# Patient Record
Sex: Male | Born: 1955
Health system: Southern US, Community
[De-identification: ages and names within clinical notes are randomized; demographics above are authoritative.]

## PROBLEM LIST (undated history)

## (undated) DIAGNOSIS — J449 Chronic obstructive pulmonary disease, unspecified: Secondary | ICD-10-CM

## (undated) DIAGNOSIS — N2889 Other specified disorders of kidney and ureter: Secondary | ICD-10-CM

## (undated) DIAGNOSIS — N400 Enlarged prostate without lower urinary tract symptoms: Secondary | ICD-10-CM

## (undated) DIAGNOSIS — Z9981 Dependence on supplemental oxygen: Secondary | ICD-10-CM

## (undated) DIAGNOSIS — E785 Hyperlipidemia, unspecified: Secondary | ICD-10-CM

## (undated) DIAGNOSIS — I5189 Other ill-defined heart diseases: Secondary | ICD-10-CM

## (undated) DIAGNOSIS — I10 Essential (primary) hypertension: Secondary | ICD-10-CM

## (undated) DIAGNOSIS — N183 Chronic kidney disease, stage 3 unspecified: Secondary | ICD-10-CM

## (undated) DIAGNOSIS — G4733 Obstructive sleep apnea (adult) (pediatric): Secondary | ICD-10-CM

## (undated) DIAGNOSIS — J45909 Unspecified asthma, uncomplicated: Secondary | ICD-10-CM

## (undated) DIAGNOSIS — I160 Hypertensive urgency: Secondary | ICD-10-CM

## (undated) DIAGNOSIS — E669 Obesity, unspecified: Secondary | ICD-10-CM

## (undated) DIAGNOSIS — E119 Type 2 diabetes mellitus without complications: Secondary | ICD-10-CM

## (undated) DIAGNOSIS — I509 Heart failure, unspecified: Secondary | ICD-10-CM

## (undated) DIAGNOSIS — I429 Cardiomyopathy, unspecified: Secondary | ICD-10-CM

## (undated) HISTORY — DX: Cardiomyopathy, unspecified: I42.9

## (undated) HISTORY — DX: Other ill-defined heart diseases: I51.89

## (undated) HISTORY — DX: Hyperlipidemia, unspecified: E78.5

## (undated) HISTORY — DX: Benign prostatic hyperplasia without lower urinary tract symptoms: N40.0

## (undated) HISTORY — DX: Other specified disorders of kidney and ureter: N28.89

## (undated) HISTORY — DX: Hypertensive urgency: I16.0

## (undated) HISTORY — DX: Obstructive sleep apnea (adult) (pediatric): G47.33

## (undated) HISTORY — DX: Obesity, unspecified: E66.9

## (undated) HISTORY — DX: Chronic kidney disease, stage 3 unspecified: N18.30

## (undated) HISTORY — DX: Unspecified asthma, uncomplicated: J45.909

## (undated) HISTORY — DX: Essential (primary) hypertension: I10

## (undated) HISTORY — DX: Chronic kidney disease, stage 3 (moderate): N18.3

---

## 2006-10-17 ENCOUNTER — Emergency Department (HOSPITAL_COMMUNITY): Admission: EM | Admit: 2006-10-17 | Discharge: 2006-10-17 | Payer: Self-pay | Admitting: Emergency Medicine

## 2006-11-07 ENCOUNTER — Emergency Department (HOSPITAL_COMMUNITY): Admission: EM | Admit: 2006-11-07 | Discharge: 2006-11-07 | Payer: Self-pay | Admitting: Emergency Medicine

## 2006-11-09 ENCOUNTER — Emergency Department (HOSPITAL_COMMUNITY): Admission: EM | Admit: 2006-11-09 | Discharge: 2006-11-09 | Payer: Self-pay | Admitting: Family Medicine

## 2011-02-24 LAB — CULTURE, ROUTINE-ABSCESS

## 2011-02-25 LAB — WOUND CULTURE

## 2011-04-22 ENCOUNTER — Emergency Department (HOSPITAL_COMMUNITY)
Admission: EM | Admit: 2011-04-22 | Discharge: 2011-04-22 | Disposition: A | Discharge status: Home / Self Care | Payer: Self-pay | Attending: Emergency Medicine | Admitting: Emergency Medicine

## 2011-04-22 DIAGNOSIS — R030 Elevated blood-pressure reading, without diagnosis of hypertension: Secondary | ICD-10-CM | POA: Insufficient documentation

## 2011-04-22 DIAGNOSIS — J40 Bronchitis, not specified as acute or chronic: Secondary | ICD-10-CM | POA: Insufficient documentation

## 2011-04-22 DIAGNOSIS — W278XXA Contact with other nonpowered hand tool, initial encounter: Secondary | ICD-10-CM | POA: Insufficient documentation

## 2011-04-22 DIAGNOSIS — S01409A Unspecified open wound of unspecified cheek and temporomandibular area, initial encounter: Secondary | ICD-10-CM | POA: Insufficient documentation

## 2011-04-22 DIAGNOSIS — IMO0002 Reserved for concepts with insufficient information to code with codable children: Secondary | ICD-10-CM

## 2011-04-22 MED ORDER — TETANUS-DIPHTHERIA TOXOIDS TD 5-2 LFU IM INJ
0.5000 mL | INJECTION | Freq: Once | INTRAMUSCULAR | Status: AC
  Administered 2011-04-22: 0.5 mL via INTRAMUSCULAR
  Filled 2011-04-22: qty 0.5

## 2011-04-22 MED ORDER — ALBUTEROL SULFATE HFA 108 (90 BASE) MCG/ACT IN AERS
2.0000 | INHALATION_SPRAY | Freq: Once | RESPIRATORY_TRACT | Status: AC
  Administered 2011-04-22: 2 via RESPIRATORY_TRACT
  Filled 2011-04-22: qty 6.7

## 2011-04-22 NOTE — ED Notes (Signed)
CBG 113 at 14:57

## 2011-04-22 NOTE — ED Provider Notes (Signed)
Medical screening examination/treatment/procedure(s) were performed by non-physician practitioner and as supervising physician I was immediately available for consultation/collaboration.  Doug Sou, MD 04/22/11 2038

## 2011-04-22 NOTE — ED Notes (Signed)
Pt to ED for eval of face lac; pt reports that he was shaving earlier, and the razor popped off and cut his R cheek; pt has no active bleeding

## 2011-04-22 NOTE — ED Notes (Signed)
Rt. Cheek laceration/bleeding controlled was shaving and the razor broke

## 2011-04-22 NOTE — ED Provider Notes (Signed)
History     CSN: [id number] Arrival date & time: 04/22/2011  2:36 PM   First MD Initiated Contact with Patient 04/22/11 1527      Chief Complaint  Patient presents with  . Facial Laceration    cough, rt. ankle swollen,     (Consider location/radiation/quality/duration/timing/severity/associated sxs/prior treatment) The history is provided by the patient.    Pt presents to the Emergency Department with complaints of a laceration to the face by a razor. The pt says he was shaving his face when the razor broke cutting his face. The incident happened this morning, since then it has stopped bleeding. Pt states that this has never happened to him before, the pain is mild, the cut is mild and he is not having any other associated symptoms. Pt also tells me that he is out of his albuterol inhaler and would like a refill. Pts blood pressure has been high in the ED, he also needs a referral to a Primary Care provider to manage his BP.  History reviewed. No pertinent past medical history.  History reviewed. No pertinent past surgical history.  Family History  Problem Relation Age of Onset  . Diabetes Mother   . Diabetes Father     History  Substance Use Topics  . Smoking status: Current Everyday Smoker    Types: Cigarettes  . Smokeless tobacco: Not on file  . Alcohol Use: Yes      Review of Systems  All other systems reviewed and are negative.    Allergies  Review of patient's allergies indicates no known allergies.  Home Medications  No current outpatient prescriptions on file.  BP 153/92  Pulse 97  Resp 20  Ht 6\' 1"  (1.854 m)  Wt 273 lb (123.832 kg)  BMI 36.02 kg/m2  SpO2 100%  Physical Exam  Constitutional: He is oriented to person, place, and time. He appears well-developed and well-nourished.  HENT:  Head: Normocephalic and atraumatic.    Eyes: Pupils are equal, round, and reactive to light.  Neck: Normal range of motion.  Cardiovascular: Normal rate and  regular rhythm.   Pulmonary/Chest: Effort normal and breath sounds normal.  Musculoskeletal: Normal range of motion.  Neurological: He is alert and oriented to person, place, and time.  Skin: Skin is warm and dry.    ED Course  Procedures (including critical care time)  Labs Reviewed  GLUCOSE, CAPILLARY - Abnormal; Notable for the following:    Glucose-Capillary 113 (*)    All other components within normal limits  POCT CBG MONITORING   No results found.   1. Laceration   2. Bronchitis       MDM  Will give pt PCP referrals, Td, and albuterol inhaler.        Dorthula Matas, PA 04/22/11 [phone]

## 2016-01-20 ENCOUNTER — Encounter (HOSPITAL_COMMUNITY): Payer: Self-pay | Admitting: Emergency Medicine

## 2016-01-20 ENCOUNTER — Emergency Department (HOSPITAL_COMMUNITY)
Admission: EM | Admit: 2016-01-20 | Discharge: 2016-01-20 | Disposition: A | Discharge status: Home / Self Care | Payer: Self-pay | Attending: Emergency Medicine | Admitting: Emergency Medicine

## 2016-01-20 ENCOUNTER — Emergency Department (HOSPITAL_COMMUNITY): Payer: Self-pay

## 2016-01-20 DIAGNOSIS — I10 Essential (primary) hypertension: Secondary | ICD-10-CM | POA: Insufficient documentation

## 2016-01-20 DIAGNOSIS — F17210 Nicotine dependence, cigarettes, uncomplicated: Secondary | ICD-10-CM | POA: Insufficient documentation

## 2016-01-20 DIAGNOSIS — M5432 Sciatica, left side: Secondary | ICD-10-CM | POA: Insufficient documentation

## 2016-01-20 LAB — I-STAT CHEM 8, ED
BUN: 27 mg/dL — ABNORMAL HIGH (ref 6–20)
Calcium, Ion: 1.11 mmol/L — ABNORMAL LOW (ref 1.15–1.40)
Chloride: 105 mmol/L (ref 101–111)
Creatinine, Ser: 1.4 mg/dL — ABNORMAL HIGH (ref 0.61–1.24)
GLUCOSE: 127 mg/dL — AB (ref 65–99)
HCT: 49 % (ref 39.0–52.0)
HEMOGLOBIN: 16.7 g/dL (ref 13.0–17.0)
POTASSIUM: 3.9 mmol/L (ref 3.5–5.1)
SODIUM: 141 mmol/L (ref 135–145)
TCO2: 29 mmol/L (ref 0–100)

## 2016-01-20 MED ORDER — TRAMADOL HCL 50 MG PO TABS: 50.0000 mg | ORAL_TABLET | Freq: Four times a day (QID) | ORAL | 0 refills | Status: DC | PRN

## 2016-01-20 MED ORDER — OXYCODONE-ACETAMINOPHEN 5-325 MG PO TABS
2.0000 | ORAL_TABLET | Freq: Once | ORAL | Status: AC
  Administered 2016-01-20: 2 via ORAL
  Filled 2016-01-20: qty 2

## 2016-01-20 NOTE — ED Provider Notes (Signed)
[facility]EM[facility] DEPT Provider Note   CSN: [id number] Arrival date & time: 01/20/16  0856     History   Chief Complaint Chief Complaint  Patient presents with  . Hypertension  . Back Pain    HPI Zachary Hawkins is a 60 y.o. male.   Patient presents with back pain. He states he has about a 1 month history of pain in his left posterior hip. He states it started after he was lifting a pool table and he felt pool to the back of his leg. He states it shoots down his leg. Occasionally he'll have some tingling to his foot and lower leg. He denies any weakness to the leg. No problems with bowel or bladder control. No abdominal pain. No nausea or vomiting. He states taking ibuprofen with some improvement in symptoms. He states the pain is worse when he ambulates. He denies any prior history of back problems. His blood pressure is noted to be elevated. He feels like it's been high in the past. He currently doesn't have a PCP. He is not been on blood pressure medications in the past.    Hypertension  Pertinent negatives include no headaches.  Back Pain   Associated symptoms include numbness. Pertinent negatives include no fever, no headaches and no weakness.    History reviewed. No pertinent past medical history.  There are no active problems to display for this patient.   History reviewed. No pertinent surgical history.     Home Medications    Prior to Admission medications   Medication Sig Start Date End Date Taking? Authorizing Provider  traMADol (ULTRAM) 50 MG tablet Take 1 tablet (50 mg total) by mouth every 6 (six) hours as needed. 01/20/16   Rolan Bucco, MD    Family History Family History  Problem Relation Age of Onset  . Diabetes Mother   . Diabetes Father     Social History Social History  Substance Use Topics  . Smoking status: Current Every Day Smoker    Types: Cigarettes  . Smokeless tobacco: Never Used  . Alcohol use Yes     Allergies   Review of  patient's allergies indicates no known allergies.   Review of Systems Review of Systems  Constitutional: Negative for fever.  Gastrointestinal: Negative for nausea and vomiting.  Musculoskeletal: Positive for arthralgias and back pain. Negative for joint swelling and neck pain.  Skin: Negative for wound.  Neurological: Positive for numbness. Negative for weakness and headaches.     Physical Exam Updated Vital Signs BP 172/95   Pulse 83   Temp 97.9 F (36.6 C) (Oral)   Resp 18   SpO2 96%   Physical Exam  Constitutional: He is oriented to person, place, and time. He appears well-developed and well-nourished.  HENT:  Head: Normocephalic and atraumatic.  Neck: Normal range of motion. Neck supple.  Cardiovascular: Normal rate.   Pulmonary/Chest: Effort normal.  Musculoskeletal: He exhibits tenderness. He exhibits no edema.  Patient has no tenderness along the spine. There is no pain to the lumbar area. There is pain to the posterior left hip, in the area of the sciatic nerve, although it feels little bit lateral. There is pain with range of motion of the hip although it seems to be more in the soft tissues. There is no palpable tenderness to the knee or ankle. No pain to the lower leg. No swelling of the leg. Pedal pulses are intact. He has normal sensation and motor function in the leg. Negative  straight leg raise. Patellar reflexes are intact.  Neurological: He is alert and oriented to person, place, and time.  Skin: Skin is warm and dry.  Psychiatric: He has a normal mood and affect.     ED Treatments / Results  Labs (all labs ordered are listed, but only abnormal results are displayed) Labs Reviewed  I-STAT CHEM 8, ED - Abnormal; Notable for the following:       Result Value   BUN 27 (*)    Creatinine, Ser 1.40 (*)    Glucose, Bld 127 (*)    Calcium, Ion 1.11 (*)    All other components within normal limits    EKG  EKG Interpretation None       Radiology Dg  Hip Unilat W Or Wo Pelvis 2-3 Views Left  Result Date: 01/20/2016 CLINICAL DATA:  Chronic left lateral and posterior hip pain for 1 month. EXAM: DG HIP (WITH OR WITHOUT PELVIS) 2-3V LEFT COMPARISON:  None. FINDINGS: There is no evidence of hip fracture or dislocation. There is no evidence of arthropathy or other focal bone abnormality. IMPRESSION: Negative left hip radiographs. Electronically Signed   By: Marin Robertshristopher  Mattern M.D.   On: 01/20/2016 11:16    Procedures Procedures (including critical care time)  Medications Ordered in ED Medications  oxyCODONE-acetaminophen (PERCOCET/ROXICET) 5-325 MG per tablet 2 tablet (2 tablets Oral Given 01/20/16 1117)     Initial Impression / Assessment and Plan / ED Course  I have reviewed the triage vital signs and the nursing notes.  Pertinent labs & imaging results that were available during my care of the patient were reviewed by me and considered in my medical decision making (see chart for details).  Clinical Course    Patient presents with posterior hip pain. It seems to be coming from his sciatica. He is neurologically intact. He has no signs of cauda equina. No back pain. His x-rays are unremarkable. His blood pressure is elevated. I don't find any documentation of significantly elevated blood pressures in the past. I encouraged him to have close follow-up with the PCP. His creatinine is also elevated. He was given a resource guide for possible outpatient follow-up. He was given a prescription for tramadol for pain. Return precautions were given.  Final Clinical Impressions(s) / ED Diagnoses   Final diagnoses:  Sciatica of left side  Essential hypertension    New Prescriptions New Prescriptions   TRAMADOL (ULTRAM) 50 MG TABLET    Take 1 tablet (50 mg total) by mouth every 6 (six) hours as needed.     Rolan BuccoMelanie Higinio Grow, MD 01/20/16 1304

## 2016-01-20 NOTE — ED Triage Notes (Signed)
Pt here for c/o left lower back pain to left leg since lifting a pool table several weeks ago. States he has intermittent left lower leg numbness.  Also, pt does not "go to a doctor". BP high.

## 2017-06-05 ENCOUNTER — Emergency Department (HOSPITAL_BASED_OUTPATIENT_CLINIC_OR_DEPARTMENT_OTHER): Payer: Self-pay

## 2017-06-05 ENCOUNTER — Inpatient Hospital Stay (HOSPITAL_BASED_OUTPATIENT_CLINIC_OR_DEPARTMENT_OTHER)
Admission: EM | Admit: 2017-06-05 | Discharge: 2017-06-07 | DRG: 291 | Disposition: A | Discharge status: Home / Self Care | Payer: Self-pay | Attending: Internal Medicine | Admitting: Internal Medicine

## 2017-06-05 ENCOUNTER — Encounter (HOSPITAL_BASED_OUTPATIENT_CLINIC_OR_DEPARTMENT_OTHER): Payer: Self-pay | Admitting: Emergency Medicine

## 2017-06-05 ENCOUNTER — Other Ambulatory Visit: Payer: Self-pay

## 2017-06-05 DIAGNOSIS — I420 Dilated cardiomyopathy: Secondary | ICD-10-CM | POA: Diagnosis present

## 2017-06-05 DIAGNOSIS — J449 Chronic obstructive pulmonary disease, unspecified: Secondary | ICD-10-CM | POA: Diagnosis present

## 2017-06-05 DIAGNOSIS — N1831 Chronic kidney disease, stage 3a: Secondary | ICD-10-CM

## 2017-06-05 DIAGNOSIS — I130 Hypertensive heart and chronic kidney disease with heart failure and stage 1 through stage 4 chronic kidney disease, or unspecified chronic kidney disease: Principal | ICD-10-CM | POA: Diagnosis present

## 2017-06-05 DIAGNOSIS — F17210 Nicotine dependence, cigarettes, uncomplicated: Secondary | ICD-10-CM | POA: Diagnosis present

## 2017-06-05 DIAGNOSIS — I5042 Chronic combined systolic (congestive) and diastolic (congestive) heart failure: Secondary | ICD-10-CM

## 2017-06-05 DIAGNOSIS — E669 Obesity, unspecified: Secondary | ICD-10-CM

## 2017-06-05 DIAGNOSIS — I5043 Acute on chronic combined systolic (congestive) and diastolic (congestive) heart failure: Secondary | ICD-10-CM | POA: Diagnosis present

## 2017-06-05 DIAGNOSIS — I160 Hypertensive urgency: Secondary | ICD-10-CM

## 2017-06-05 DIAGNOSIS — R0600 Dyspnea, unspecified: Secondary | ICD-10-CM

## 2017-06-05 DIAGNOSIS — F1290 Cannabis use, unspecified, uncomplicated: Secondary | ICD-10-CM | POA: Diagnosis present

## 2017-06-05 DIAGNOSIS — Z6838 Body mass index (BMI) 38.0-38.9, adult: Secondary | ICD-10-CM

## 2017-06-05 DIAGNOSIS — I429 Cardiomyopathy, unspecified: Secondary | ICD-10-CM

## 2017-06-05 DIAGNOSIS — E6609 Other obesity due to excess calories: Secondary | ICD-10-CM

## 2017-06-05 DIAGNOSIS — N289 Disorder of kidney and ureter, unspecified: Secondary | ICD-10-CM

## 2017-06-05 DIAGNOSIS — Z9119 Patient's noncompliance with other medical treatment and regimen: Secondary | ICD-10-CM

## 2017-06-05 DIAGNOSIS — J069 Acute upper respiratory infection, unspecified: Secondary | ICD-10-CM

## 2017-06-05 DIAGNOSIS — N183 Chronic kidney disease, stage 3 (moderate): Secondary | ICD-10-CM | POA: Diagnosis present

## 2017-06-05 DIAGNOSIS — N179 Acute kidney failure, unspecified: Secondary | ICD-10-CM

## 2017-06-05 DIAGNOSIS — E662 Morbid (severe) obesity with alveolar hypoventilation: Secondary | ICD-10-CM | POA: Diagnosis present

## 2017-06-05 DIAGNOSIS — I161 Hypertensive emergency: Secondary | ICD-10-CM | POA: Diagnosis present

## 2017-06-05 LAB — COMPREHENSIVE METABOLIC PANEL
ALBUMIN: 3.9 g/dL (ref 3.5–5.0)
ALT: 24 U/L (ref 17–63)
ANION GAP: 6 (ref 5–15)
AST: 24 U/L (ref 15–41)
Alkaline Phosphatase: 61 U/L (ref 38–126)
BILIRUBIN TOTAL: 0.6 mg/dL (ref 0.3–1.2)
BUN: 23 mg/dL — AB (ref 6–20)
CO2: 28 mmol/L (ref 22–32)
Calcium: 9 mg/dL (ref 8.9–10.3)
Chloride: 104 mmol/L (ref 101–111)
Creatinine, Ser: 1.68 mg/dL — ABNORMAL HIGH (ref 0.61–1.24)
GFR calc Af Amer: 50 mL/min — ABNORMAL LOW (ref >60)
GFR calc non Af Amer: 43 mL/min — ABNORMAL LOW (ref >60)
GLUCOSE: 112 mg/dL — AB (ref 65–99)
POTASSIUM: 4.5 mmol/L (ref 3.5–5.1)
Sodium: 138 mmol/L (ref 135–145)
Total Protein: 7.4 g/dL (ref 6.5–8.1)

## 2017-06-05 LAB — D-DIMER, QUANTITATIVE: D-Dimer, Quant: <0.27 ug/mL-FEU (ref 0.00–0.50)

## 2017-06-05 LAB — CBC WITH DIFFERENTIAL/PLATELET
Basophils Absolute: 0 10*3/uL (ref 0.0–0.1)
Basophils Relative: 1 %
EOS PCT: 10 %
Eosinophils Absolute: 0.5 10*3/uL (ref 0.0–0.7)
HCT: 48.2 % (ref 39.0–52.0)
Hemoglobin: 16.2 g/dL (ref 13.0–17.0)
LYMPHS ABS: 1.9 10*3/uL (ref 0.7–4.0)
LYMPHS PCT: 34 %
MCH: 30.1 pg (ref 26.0–34.0)
MCHC: 33.6 g/dL (ref 30.0–36.0)
MCV: 89.4 fL (ref 78.0–100.0)
MONOS PCT: 10 %
Monocytes Absolute: 0.6 10*3/uL (ref 0.1–1.0)
Neutro Abs: 2.6 10*3/uL (ref 1.7–7.7)
Neutrophils Relative %: 45 %
PLATELETS: 204 10*3/uL (ref 150–400)
RBC: 5.39 MIL/uL (ref 4.22–5.81)
RDW: 14.9 % (ref 11.5–15.5)
WBC: 5.6 10*3/uL (ref 4.0–10.5)

## 2017-06-05 LAB — TROPONIN I
Troponin I: 0.04 ng/mL (ref <0.03)
Troponin I: 0.03 ng/mL (ref <0.03)
Troponin I: 0.04 ng/mL (ref <0.03)

## 2017-06-05 LAB — RAPID URINE DRUG SCREEN, HOSP PERFORMED
AMPHETAMINES: NOT DETECTED
BENZODIAZEPINES: NOT DETECTED
Barbiturates: NOT DETECTED
Cocaine: POSITIVE — AB
Opiates: NOT DETECTED
Tetrahydrocannabinol: NOT DETECTED

## 2017-06-05 LAB — CBC
HCT: 47.1 % (ref 39.0–52.0)
Hemoglobin: 15.7 g/dL (ref 13.0–17.0)
MCH: 30.5 pg (ref 26.0–34.0)
MCHC: 33.3 g/dL (ref 30.0–36.0)
MCV: 91.6 fL (ref 78.0–100.0)
PLATELETS: 208 10*3/uL (ref 150–400)
RBC: 5.14 MIL/uL (ref 4.22–5.81)
RDW: 14.8 % (ref 11.5–15.5)
WBC: 6.9 10*3/uL (ref 4.0–10.5)

## 2017-06-05 LAB — CREATININE, SERUM
Creatinine, Ser: 1.63 mg/dL — ABNORMAL HIGH (ref 0.61–1.24)
GFR, EST AFRICAN AMERICAN: 52 mL/min — AB (ref >60)
GFR, EST NON AFRICAN AMERICAN: 45 mL/min — AB (ref >60)

## 2017-06-05 LAB — BRAIN NATRIURETIC PEPTIDE: B Natriuretic Peptide: 117.2 pg/mL — ABNORMAL HIGH (ref 0.0–100.0)

## 2017-06-05 MED ORDER — HEPARIN BOLUS VIA INFUSION
4000.0000 [IU] | Freq: Once | INTRAVENOUS | Status: AC
  Administered 2017-06-05: 4000 [IU] via INTRAVENOUS

## 2017-06-05 MED ORDER — HEPARIN (PORCINE) IN NACL 100-0.45 UNIT/ML-% IJ SOLN
1500.0000 [IU]/h | INTRAMUSCULAR | Status: DC
  Administered 2017-06-05: 1500 [IU]/h via INTRAVENOUS
  Filled 2017-06-05: qty 250

## 2017-06-05 MED ORDER — ENOXAPARIN SODIUM 40 MG/0.4ML ~~LOC~~ SOLN
40.0000 mg | SUBCUTANEOUS | Status: DC
  Administered 2017-06-05 – 2017-06-06 (×2): 40 mg via SUBCUTANEOUS
  Filled 2017-06-05 (×2): qty 0.4

## 2017-06-05 MED ORDER — ASPIRIN EC 81 MG PO TBEC
81.0000 mg | DELAYED_RELEASE_TABLET | Freq: Every day | ORAL | Status: DC
  Administered 2017-06-05 – 2017-06-07 (×3): 81 mg via ORAL
  Filled 2017-06-05 (×3): qty 1

## 2017-06-05 MED ORDER — ENALAPRILAT 1.25 MG/ML IV SOLN
1.2500 mg | Freq: Four times a day (QID) | INTRAVENOUS | Status: DC | PRN
  Administered 2017-06-05 – 2017-06-06 (×2): 1.25 mg via INTRAVENOUS
  Filled 2017-06-05 (×3): qty 1

## 2017-06-05 MED ORDER — HYDRALAZINE HCL 20 MG/ML IJ SOLN
10.0000 mg | Freq: Once | INTRAMUSCULAR | Status: AC
  Administered 2017-06-05: 10 mg via INTRAVENOUS
  Filled 2017-06-05: qty 1

## 2017-06-05 MED ORDER — NITROGLYCERIN 0.4 MG SL SUBL
0.4000 mg | SUBLINGUAL_TABLET | Freq: Once | SUBLINGUAL | Status: AC
  Administered 2017-06-05: 0.4 mg via SUBLINGUAL
  Filled 2017-06-05: qty 1

## 2017-06-05 MED ORDER — IPRATROPIUM-ALBUTEROL 0.5-2.5 (3) MG/3ML IN SOLN
3.0000 mL | Freq: Once | RESPIRATORY_TRACT | Status: AC
  Administered 2017-06-05: 3 mL via RESPIRATORY_TRACT
  Filled 2017-06-05: qty 3

## 2017-06-05 MED ORDER — TRAMADOL HCL 50 MG PO TABS
50.0000 mg | ORAL_TABLET | Freq: Four times a day (QID) | ORAL | Status: DC | PRN
  Administered 2017-06-06: 50 mg via ORAL
  Filled 2017-06-05: qty 1

## 2017-06-05 MED ORDER — AMLODIPINE BESYLATE 5 MG PO TABS
5.0000 mg | ORAL_TABLET | Freq: Every day | ORAL | Status: DC
  Administered 2017-06-06 – 2017-06-07 (×2): 5 mg via ORAL
  Filled 2017-06-05 (×2): qty 1

## 2017-06-05 MED ORDER — ACETAMINOPHEN 650 MG RE SUPP: 650.0000 mg | Freq: Four times a day (QID) | RECTAL | Status: DC | PRN

## 2017-06-05 MED ORDER — ONDANSETRON HCL 4 MG PO TABS: 4.0000 mg | ORAL_TABLET | Freq: Four times a day (QID) | ORAL | Status: DC | PRN

## 2017-06-05 MED ORDER — ONDANSETRON HCL 4 MG/2ML IJ SOLN: 4.0000 mg | Freq: Four times a day (QID) | INTRAMUSCULAR | Status: DC | PRN

## 2017-06-05 MED ORDER — LABETALOL HCL 5 MG/ML IV SOLN
10.0000 mg | Freq: Once | INTRAVENOUS | Status: AC
  Administered 2017-06-05: 10 mg via INTRAVENOUS
  Filled 2017-06-05: qty 4

## 2017-06-05 MED ORDER — ASPIRIN 81 MG PO CHEW
324.0000 mg | CHEWABLE_TABLET | Freq: Once | ORAL | Status: AC
  Administered 2017-06-05: 324 mg via ORAL
  Filled 2017-06-05: qty 4

## 2017-06-05 MED ORDER — ALBUTEROL SULFATE (2.5 MG/3ML) 0.083% IN NEBU
INHALATION_SOLUTION | RESPIRATORY_TRACT | Status: AC
  Administered 2017-06-05: 2.5 mg
  Filled 2017-06-05: qty 3

## 2017-06-05 MED ORDER — BISACODYL 5 MG PO TBEC: 5.0000 mg | DELAYED_RELEASE_TABLET | Freq: Every day | ORAL | Status: DC | PRN

## 2017-06-05 MED ORDER — SENNOSIDES-DOCUSATE SODIUM 8.6-50 MG PO TABS: 1.0000 | ORAL_TABLET | Freq: Every evening | ORAL | Status: DC | PRN

## 2017-06-05 MED ORDER — METOPROLOL TARTRATE 50 MG PO TABS
50.0000 mg | ORAL_TABLET | Freq: Once | ORAL | Status: AC
  Administered 2017-06-05: 50 mg via ORAL
  Filled 2017-06-05: qty 1

## 2017-06-05 MED ORDER — CARVEDILOL 6.25 MG PO TABS
6.2500 mg | ORAL_TABLET | Freq: Two times a day (BID) | ORAL | Status: DC
  Administered 2017-06-05 – 2017-06-06 (×2): 6.25 mg via ORAL
  Filled 2017-06-05 (×2): qty 1

## 2017-06-05 MED ORDER — SODIUM CHLORIDE 0.9% FLUSH
3.0000 mL | Freq: Two times a day (BID) | INTRAVENOUS | Status: DC
  Administered 2017-06-06 – 2017-06-07 (×3): 3 mL via INTRAVENOUS

## 2017-06-05 MED ORDER — ACETAMINOPHEN 325 MG PO TABS: 650.0000 mg | ORAL_TABLET | Freq: Four times a day (QID) | ORAL | Status: DC | PRN

## 2017-06-05 NOTE — ED Notes (Signed)
Report to Calvin with [facility]  °

## 2017-06-05 NOTE — ED Notes (Signed)
Attempted report 

## 2017-06-05 NOTE — ED Notes (Signed)
Patient transported to X-ray 

## 2017-06-05 NOTE — Consult Note (Signed)
Cardiology Consultation:   Patient ID: [name]; [id number]; 1955/11/25   Admit date: 06/05/2017 Date of Consult: 06/05/2017  Primary Care Provider: System, Provider Not In Primary Cardiologist: No primary care provider on file.  Primary Electrophysiologist:  n/a   Patient Profile:   Zachary Hawkins is a 62 y.o. male with a hx of untreated severe HTN who is being seen today for the evaluation of bradycardia and abnormal troponin at the request of Dr. Willette Pa.  History of Present Illness:   Zachary Hawkins has been diagnosed with HTN for over 20 years, but is not taking any medications. He cannot afford them.   He had acute worsening of shortness of breath this morning preceded by cough for the last month.  Blood pressure markedly elevated in the emergency room at 200/150.  Troponin minimally elevated at 0.04.  Creatinine somewhat increased at 1.7.  He was given nitroglycerin and IV beta-blocker.  He developed symptomatic bradycardia with heart rates in the 20s-30s and occasional pauses.  The bradycardic episodes were reported as very brief. He was therefore transferred to [facility] ED for further evaluation and treatment.  He denies chest discomfort today or in the recent past. 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 Denies palpitations, syncope, edema, claudication, hypersomnolence, focal neuro symptoms. "Sleeps like a baby". He is a loud snorer.  Past medical history: HTN, unaware that his renal function is abnormal, but CKD supported by labs No pertinent surgical history.  Not taking any meds  Inpatient Medications: Scheduled Meds: . [START ON 06/06/2017] amLODipine  5 mg Oral Daily  . aspirin EC  81 mg Oral Daily  . enoxaparin (LOVENOX) injection  40 mg Subcutaneous Q24H  . sodium chloride flush  3 mL Intravenous Q12H   Continuous Infusions:  PRN Meds: acetaminophen **OR** acetaminophen, bisacodyl, ondansetron **OR** ondansetron (ZOFRAN) IV,  senna-docusate, traMADol  Allergies:   No Known Allergies  Social History:   Social History   Socioeconomic History  . Marital status: Single    Spouse name: Not on file  . Number of children: Not on file  . Years of education: Not on file  . Highest education level: Not on file  Social Needs  . Financial resource strain: Not on file  . Food insecurity - worry: Not on file  . Food insecurity - inability: Not on file  . Transportation needs - medical: Not on file  . Transportation needs - non-medical: Not on file  Occupational History  . Not on file  Tobacco Use  . Smoking status: Current Every Day Smoker    Packs/day: 1.00    Types: Cigarettes  . Smokeless tobacco: Never Used  Substance and Sexual Activity  . Alcohol use: Yes    Comment: socially  . Drug use: Yes    Types: Marijuana  . Sexual activity: Not on file  Other Topics Concern  . Not on file  Social History Narrative  . Not on file    Family History:   Sister has sleep apnea Family History  Problem Relation Age of Onset  . Diabetes Mother   . Diabetes Father      ROS:  Please see the history of present illness.  All other ROS reviewed and negative.     Physical Exam/Data:   Vitals:   06/05/17 1203 06/05/17 1215 06/05/17 1245 06/05/17 1425  BP: (!) 159/105 (!) 142/116 (!) 173/115 128/89  Pulse: 71 72 86 77  Resp: 20 (!) 22 18  16  Temp:    98.7 F (37.1 C)  TempSrc:    Oral  SpO2: 99% 100% 99% 100%  Weight:      Height:        Intake/Output Summary (Last 24 hours) at 06/05/2017 1519 Last data filed at 06/05/2017 1037 Gross per 24 hour  Intake -  Output 100 ml  Net -100 ml   Filed Weights   06/05/17 0837  Weight: 290 lb (131.5 kg)   Body mass index is 38.26 kg/m.  General:  Obese, well developed, in no acute distress HEENT: normal. Tick neck, very crowded oropharynx. Lymph: no adenopathy Neck: no JVD Endocrine:  No thryomegaly Vascular: No carotid bruits; FA pulses 2+ bilaterally  without bruits  Cardiac:  normal S1, S2; RRR; no murmur  Lungs:  clear to auscultation bilaterally, no wheezing, rhonchi or rales  Abd: soft, nontender, no hepatomegaly  Ext: no edema Musculoskeletal:  No deformities, BUE and BLE strength normal and equal Skin: warm and dry  Neuro:  CNs 2-12 intact, no focal abnormalities noted Psych:  Normal affect   EKG:  The EKG was personally reviewed and demonstrates:  NSR, LVH with secondary repol changes Telemetry:  Telemetry was personally reviewed and demonstrates:  NSR   Laboratory Data:  Chemistry Recent Labs  Lab 06/05/17 0848  NA 138  K 4.5  CL 104  CO2 28  GLUCOSE 112*  BUN 23*  CREATININE 1.68*  CALCIUM 9.0  GFRNONAA 43*  GFRAA 50*  ANIONGAP 6    Recent Labs  Lab 06/05/17 0848  PROT 7.4  ALBUMIN 3.9  AST 24  ALT 24  ALKPHOS 61  BILITOT 0.6   Hematology Recent Labs  Lab 06/05/17 0848  WBC 5.6  RBC 5.39  HGB 16.2  HCT 48.2  MCV 89.4  MCH 30.1  MCHC 33.6  RDW 14.9  PLT 204   Cardiac Enzymes Recent Labs  Lab 06/05/17 0848 06/05/17 1153  TROPONINI 0.04* 0.03*   No results for input(s): TROPIPOC in the last 168 hours.  BNP Recent Labs  Lab 06/05/17 0848  BNP 117.2*    DDimer  Recent Labs  Lab 06/05/17 1222  DDIMER <0.27    Radiology/Studies:  Dg Chest 2 View  Result Date: 06/05/2017 CLINICAL DATA:  62 year old male with a history cough and congestion EXAM: CHEST  2 VIEW COMPARISON:  None. FINDINGS: Cardiomediastinal silhouette borderline enlarged. No evidence of central vascular congestion. Diffusely coarsened interstitial markings without confluent airspace disease. No pleural effusion. Stigmata of emphysema, with increased retrosternal airspace, flattened hemidiaphragms, increased AP diameter, and hyperinflation on the AP view. No pleural effusion or pneumothorax. IMPRESSION: Emphysema and diffusely coarsened interstitial markings of the lungs without comparison. Findings may reflect chronic  lung changes, however, atypical infection difficult to exclude. Electronically Signed   By: Gilmer MorJaime  Wagner D.O.   On: 06/05/2017 09:25    Assessment and Plan:   1. Severe HTN: Evidence of renal impairment and LVH (probably has diastolic heart failure, but is not overtly hypervolemic). I am sure that he will require combination antihypertensive therapy. Renal dysfunction is mild to-moderate and does not preclude use of RAAS inhibitors. I suspect that the episodic bradycardia was due to an apneic episode when he dozed off. I do not see clear evidence that we should avoid beta blockers. Currently on amlodipine, will add carvedilol. Will probably then add a diuretic. 2. CHF: suspect he has underlying diastolic heart failure due to LVH and that this is the proximate cause  of dyspnea.. Check echo. Reviewed sodium restriction, weight loss and exercise in detail. 3. CKD3: he is a large frame and tall man. The serum creatinine probably overestimates the severity of his renal dysfunction. GFR calculates right at 50 mL/min and may improve with better BP control. 4. Abnormal troponin: he is not experiencing an acute coronary event. The ECG changes are explained by LVH. Tiny troponin leak could be explained by CHF and severe HBP. He never had angina. 5. Probable OSA: strong level of suspicion based on exam. He has a sister that uses CPAP. Suspect sleep related apnea could explain the brief episodes of bradycardia at [facility]. Needs a sleep study, but is uninsured. Discussed weight loss, avoiding sleeping in supine position. 6. Obesity:  Can expect multiple benefits from weight loss for HTN, CHF, OSA.   For questions or updates, please contact [facility] Please consult [email or web address] for contact info under Cardiology/STEMI.   Signed, Tereso Newcomer, PA-C  06/05/2017 3:19 PM

## 2017-06-05 NOTE — ED Notes (Signed)
Troponin 0.04, given to ED MD

## 2017-06-05 NOTE — ED Notes (Signed)
Report to Pilgrim's PrideBrooke, Consulting civil engineercharge RN [facility]

## 2017-06-05 NOTE — ED Notes (Signed)
Repeated EKG. Pt still complaining of SHOB. MD aware.

## 2017-06-05 NOTE — ED Provider Notes (Signed)
Patient arrives from [facility] where he presented this morning with complaint of shortness of breath.  In short, Zachary Hawkins is a 62 year old male with no documented past medical history. Troponin was minimally elevated at 0.04, but remained stable at med center.  He had mildly elevated BNP and mildly elevated creatinine.  Patient was treated as hypertensive emergency, with symptoms improving in tandem with blood pressure control.  D-dimer negative.  Patient was noted to have had episodes of bradycardia in the 20s and 30s accompanied by lightheadedness and decreased responsiveness.  These episodes would reportedly self correct.   HPI from Benjiman Core, MD: "Patient presents with shortness of breath.  States that around 630 this morning woke up in a difficulty getting a breath.  States he was just unable to take the breath.  No pain.  Has had some improvement in shortness of breath but still does not feel normal.  No chest pain.  No swelling in his legs.  Over the last month he has been dealing with a cough with some mild sputum production.  No reported history of hypertension but is hypertensive here.  No hemoptysis."  History reviewed. No pertinent past medical history.  Clinical Course as of Jun 05 1613  Zachary Hawkins Jun 05, 2017  1412 Made first contact with patient in [facility] ED as soon as patient arrived from [facility].  Denies dizziness, chest pain, shortness of breath, or any other current complaints.  [SJ]  1450 Spoke with Dr. Wyline Mood, cardiology, to notify him for patient's arrival in [facility] ED. States they will come evaluate the patient.  [SJ]  1600 Spoke with Dr. Royann Shivers, cardiologist, after his evaluation of the patient. States he does not think patient's symptoms are due to ACS and patient does not need to be on heparin. Bradycardic episodes may have been due to sleep apnea.  [SJ]    Clinical Course User Index [SJ] Astor Gentle C, PA-Hawkins    Physical Exam  Constitutional: He is  well-developed, well-nourished, and in no distress. No distress.  HENT:  Head: Normocephalic and atraumatic.  Eyes: Conjunctivae are normal.  Neck: Neck supple.  Cardiovascular: Normal rate, regular rhythm, normal heart sounds and intact distal pulses.  Pulmonary/Chest: Effort normal and breath sounds normal. No respiratory distress.  Patient shows no increased work of breathing.  Speaks in full sentences without difficulty.  Abdominal: Soft.  Musculoskeletal: He exhibits no edema.  Lymphadenopathy:    He has no cervical adenopathy.  Neurological: He is alert.  Skin: Skin is warm and dry. He is not diaphoretic.  Nursing note and vitals reviewed.   Labs Reviewed  BRAIN NATRIURETIC PEPTIDE - Abnormal; Notable for the following components:      Result Value   B Natriuretic Peptide 117.2 (*)    All other components within normal limits  COMPREHENSIVE METABOLIC PANEL - Abnormal; Notable for the following components:   Glucose, Bld 112 (*)    BUN 23 (*)    Creatinine, Ser 1.68 (*)    GFR calc non Af Amer 43 (*)    GFR calc Af Amer 50 (*)    All other components within normal limits  TROPONIN I - Abnormal; Notable for the following components:   Troponin I 0.04 (*)    All other components within normal limits  TROPONIN I - Abnormal; Notable for the following components:   Troponin I 0.03 (*)    All other components within normal limits  CBC WITH DIFFERENTIAL/PLATELET  D-DIMER, QUANTITATIVE (NOT  AT [facility])  HIV ANTIBODY (ROUTINE TESTING)  CBC  CREATININE, SERUM  RAPID URINE DRUG SCREEN, HOSP PERFORMED  TROPONIN I  TROPONIN I   Dg Chest 2 View  Result Date: 06/05/2017 CLINICAL DATA:  62 year old male with a history cough and congestion EXAM: CHEST  2 VIEW COMPARISON:  None. FINDINGS: Cardiomediastinal silhouette borderline enlarged. No evidence of central vascular congestion. Diffusely coarsened interstitial markings without confluent airspace disease. No pleural effusion. Stigmata  of emphysema, with increased retrosternal airspace, flattened hemidiaphragms, increased AP diameter, and hyperinflation on the AP view. No pleural effusion or pneumothorax. IMPRESSION: Emphysema and diffusely coarsened interstitial markings of the lungs without comparison. Findings may reflect chronic lung changes, however, atypical infection difficult to exclude. Electronically Signed   By: Gilmer MorJaime  Wagner D.O.   On: 06/05/2017 09:25    [facility] Patient is symptom-free upon arrival. Spoke with hospitalist, Dr. Willette PaSheehan, to notify her [facility]. My attending, Dr. Clarene DukeLittle, also spoke with Dr. Willette PaSheehan and Dr. Willette PaSheehan confirmed her admission of the patient.   Findings and plan of care discussed with Zachary Peersachel Little, MD.     Vitals:   06/05/17 1203 06/05/17 1215 06/05/17 1245 06/05/17 1425  BP: (!) 159/105 (!) 142/116 (!) 173/115 128/89  Pulse: 71 72 86 77  Resp: 20 (!) 22 18 16   Temp:    98.7 F (37.1 Hawkins)  TempSrc:    Oral  SpO2: 99% 100% 99% 100%  Weight:      Height:          Zachary Hawkins, Zachary Loya C, PA-Hawkins 06/05/17 1615    Hawkins, Ambrose Finlandachel Morgan, MD 06/06/17 [phone]

## 2017-06-05 NOTE — ED Notes (Signed)
Pt on cardiac monitor and auto VS 

## 2017-06-05 NOTE — ED Notes (Addendum)
HR 22-39; pt alert; HR back up 60-70; EDP notified and at bedside.

## 2017-06-05 NOTE — ED Notes (Signed)
X-ray delay, RT states she will call xray when pt is done with breathing tx

## 2017-06-05 NOTE — ED Notes (Signed)
ED Provider at bedside. 

## 2017-06-05 NOTE — ED Notes (Signed)
Pt called out to say he felt more SHOB; VSS; EDP notified; RT at bedside to assess need for nebulizer.

## 2017-06-05 NOTE — ED Notes (Signed)
Pt continues to state he doesn't feel well; denies pain; HR noted to drop as low as 39, but back up to 70s quickly; A brief period of asystole noted on monitor; pt was awake, but not responsive; EDP notified and to bedside immediately; pt HR back up 70-80. Pt placed on defib pads and connected to defibrillator.

## 2017-06-05 NOTE — ED Notes (Addendum)
Per [facility]- [facility], was waiting for SDU, having episodes of low HR in 30's. He is a x 4. HR 70 SR. He has no health hx. BP 172/112.

## 2017-06-05 NOTE — ED Notes (Signed)
ED Provider at bedside discussing plan of care. 

## 2017-06-05 NOTE — Progress Notes (Signed)
ANTICOAGULATION CONSULT NOTE - Initial Consult  Pharmacy Consult:  Heparin Indication: chest pain/ACS  No Known Allergies  Patient Measurements: Height: 6\' 1"  (185.4 cm) Weight: 290 lb (131.5 kg) IBW/kg (Calculated) : 79.9 Heparin Dosing Weight: 109 kg  Vital Signs: Temp: 98.2 F (36.8 C) (01/27 0837) Temp Source: Oral (01/27 0837) BP: 159/105 (01/27 1203) Pulse Rate: 71 (01/27 1203)  Labs: Recent Labs    06/05/17 0848  HGB 16.2  HCT 48.2  PLT 204  CREATININE 1.68*  TROPONINI 0.04*    Estimated Creatinine Clearance: 67.3 mL/min (A) (by C-G formula based on SCr of 1.68 mg/dL (H)).   Medical History: History reviewed. No pertinent past medical history.    Assessment: [age] YOM with no significant PMH presented with SOB.  Pharmacy consulted to start IV heparin for ACS.  Baseline labs reviewed.   Goal of Therapy:  Heparin level 0.3-0.7 units/ml Monitor platelets by anticoagulation protocol: Yes    Plan:  Heparin 4000 units IV bolus x 1, then Heparin gtt at 1500 units/hr Check 6 hr heparin level  Daily heparin level and CBC    Skylah Delauter D. Laney Potashang, PharmD, BCPS Pager:  [phone] 06/05/2017, 12:09 PM

## 2017-06-05 NOTE — ED Triage Notes (Addendum)
"   I got up this am and I could not get a deep breath " Denies pain but having cough and congestion

## 2017-06-05 NOTE — H&P (Signed)
History and Physical    Zachary Hawkins:[id number] DOB: 02-20-1956 DOA: 06/05/2017  PCP: none Patient coming from:   I have personally briefly reviewed patient's old medical records in [facility]  Chief Complaint: Shortness of breath this morning when he awoke  HPI: Zachary Hawkins is a 62 y.o. male with no significant past medical history presents the ER today complaining of shortness of breath when he awoke this morning.  The patient does not regularly see a physician but started developing shortness of breath this morning.  Around 6:30 AM with dyspnea.  He was unable to take a breath.  He had no associated pain.  He did have some improvement in his shortness of breath but still did not feel normal.  He denies any chest pain or swelling in his legs over the last month he has been dealing with a cough and some mild sputum production.  He has no reported history of hypertension but was very hypertensive in the emergency department he denied any hemoptysis he had no significant past medical social or family history.  In the emergency department the patient was evaluated his troponin was minimally elevated his creatinine has gone up from 1.4 a year ago to 1.7 now.  Given these findings and the fact that the patient has absolutely no follow-up the plan was to to transfer him to the hospital after treating him in the emergency department at [facility].  He continues complaint of difficulty taking a deep breath and the ER physician gave him some labetalol and then an oral dose of metoprolol.  The patient then developed some pauses and was transferred to the ED at [facility] for closer monitoring pending bed placement.  By the time the patient arrived in the ER here it appears that his heart rate is back into the 70s and there is no evidence of pauses.  Case has been discussed with Dr. Wyline Mood from cardiology both by the ER physician and by me.  Chest x-ray did not show congestive heart  failure but did show some signs consistent with emphysema and the patient is a longtime smoker.  And his abnormal troponin and elevated blood pressures the patient will be admitted into the hospital for further evaluation and management.  Patient does show signs and symptoms of hypertensive emergency without congestive heart failure.   Review of Systems: As per HPI otherwise 10 point review of systems negative.   History reviewed. No pertinent past medical history.  History reviewed. No pertinent surgical history.   reports that he has been smoking cigarettes.  He has been smoking about 1.00 pack per day. he has never used smokeless tobacco. He reports that he drinks alcohol. He reports that he uses drugs. Drug: Marijuana.  No Known Allergies  Family History  Problem Relation Age of Onset  . Diabetes Mother   . Diabetes Father      Prior to Admission medications   Not on File    Physical Exam: Vitals:   06/05/17 1203 06/05/17 1215 06/05/17 1245 06/05/17 1425  BP: (!) 159/105 (!) 142/116 (!) 173/115 128/89  Pulse: 71 72 86 77  Resp: 20 (!) 22 18 16   Temp:    98.7 F (37.1 C)  TempSrc:    Oral  SpO2: 99% 100% 99% 100%  Weight:      Height:       .TCS Constitutional: NAD, calm, comfortable Vitals:   06/05/17 1203 06/05/17 1215 06/05/17 1245 06/05/17 1425  BP: (!) 159/105 (!) 142/116 (!) 173/115 128/89  Pulse: 71 72 86 77  Resp: 20 (!) 22 18 16   Temp:    98.7 F (37.1 C)  TempSrc:    Oral  SpO2: 99% 100% 99% 100%  Weight:      Height:       Eyes: PERRL, lids and conjunctivae normal ENMT: Mucous membranes are moist. Posterior pharynx clear of any exudate or lesions.Normal dentition.  Neck: normal, supple, no masses, no thyromegaly Respiratory: clear to auscultation bilaterally, no wheezing, no crackles. Normal respiratory effort. No accessory muscle use.  Cardiovascular: Regular rate and rhythm, no murmurs / rubs / gallops. No extremity edema. 2+ pedal pulses. No  carotid bruits.  Abdomen: no tenderness, no masses palpated. No hepatosplenomegaly. Bowel sounds positive.  Musculoskeletal: no clubbing / cyanosis. No joint deformity upper and lower extremities. Good ROM, no contractures. Normal muscle tone.  Skin: no rashes, lesions, ulcers. No induration Neurologic: CN 2-12 grossly intact. Sensation intact, DTR normal. Strength 5/5 in all 4.  Psychiatric: Normal judgment and insight. Alert and oriented x 3. Normal mood.    Labs on Admission: I have personally reviewed following labs and imaging studies  CBC: Recent Labs  Lab 06/05/17 0848  WBC 5.6  NEUTROABS 2.6  HGB 16.2  HCT 48.2  MCV 89.4  PLT 204   Basic Metabolic Panel: Recent Labs  Lab 06/05/17 0848  NA 138  K 4.5  CL 104  CO2 28  GLUCOSE 112*  BUN 23*  CREATININE 1.68*  CALCIUM 9.0   GFR: Estimated Creatinine Clearance: 67.3 mL/min (A) (by C-G formula based on SCr of 1.68 mg/dL (H)). Liver Function Tests: Recent Labs  Lab 06/05/17 0848  AST 24  ALT 24  ALKPHOS 61  BILITOT 0.6  PROT 7.4  ALBUMIN 3.9   No results for input(s): LIPASE, AMYLASE in the last 168 hours. No results for input(s): AMMONIA in the last 168 hours. Coagulation Profile: No results for input(s): INR, PROTIME in the last 168 hours. Cardiac Enzymes: Recent Labs  Lab 06/05/17 0848 06/05/17 1153  TROPONINI 0.04* 0.03*   BNP (last 3 results) No results for input(s): PROBNP in the last 8760 hours. HbA1C: No results for input(s): HGBA1C in the last 72 hours. CBG: No results for input(s): GLUCAP in the last 168 hours. Lipid Profile: No results for input(s): CHOL, HDL, LDLCALC, TRIG, CHOLHDL, LDLDIRECT in the last 72 hours. Thyroid Function Tests: No results for input(s): TSH, T4TOTAL, FREET4, T3FREE, THYROIDAB in the last 72 hours. Anemia Panel: No results for input(s): VITAMINB12, FOLATE, FERRITIN, TIBC, IRON, RETICCTPCT in the last 72 hours. Urine analysis: No results found for: COLORURINE,  APPEARANCEUR, LABSPEC, PHURINE, GLUCOSEU, HGBUR, BILIRUBINUR, KETONESUR, PROTEINUR, UROBILINOGEN, NITRITE, LEUKOCYTESUR  Radiological Exams on Admission: Dg Chest 2 View  Result Date: 06/05/2017 CLINICAL DATA:  62 year old male with a history cough and congestion EXAM: CHEST  2 VIEW COMPARISON:  None. FINDINGS: Cardiomediastinal silhouette borderline enlarged. No evidence of central vascular congestion. Diffusely coarsened interstitial markings without confluent airspace disease. No pleural effusion. Stigmata of emphysema, with increased retrosternal airspace, flattened hemidiaphragms, increased AP diameter, and hyperinflation on the AP view. No pleural effusion or pneumothorax. IMPRESSION: Emphysema and diffusely coarsened interstitial markings of the lungs without comparison. Findings may reflect chronic lung changes, however, atypical infection difficult to exclude. Electronically Signed   By: Gilmer MorJaime  Wagner D.O.   On: 06/05/2017 09:25    EKG: Independently reviewed.  Patient had 4 EKGs obtained in the ER all  showed sinus rhythm.  I did not see any pauses on any of the EKGs but there were some strips reported by Dr. Rubin Payor . I personally reviewed those EKG tracings.  Assessment/Plan Principal Problem:   Hypertensive emergency without congestive heart failure Active Problems:   Dyspnea   1.  Hypertensive emergency without congestive heart failure: Patient will be admitted into the hospital.  As his blood pressure responded well to beta-blocker however his heart rate did not we will hold further beta-blocker for now.  His blood pressure seems to be responding fairly well to previously received medications.  Heart rate has stabilized out and is no longer bradycardic.  We will start him on amlodipine 5 mg p.o. in the a.m. tomorrow.  If blood pressure is elevated IV Vasotec has been ordered with parameters.  Please see orders.  Appreciate consultation by Dr. Wyline Mood.  2.  Dyspnea: This may be  indicative of the degree of hypertension.  Patient does not show any overall signs of congestive heart failure I will however check an echocardiogram to further evaluate his cardiac function in the face of severe hypertension.  3.  Abnormal troponin: We will cycle serial enzymes and repeat EKG in the a.m.  Patient without any chest pain I do not think that heparin or aspirin or any statins are indicated at this point.  DVT prophylaxis: Lovenox Code Status: Full Family Communication: Book with patient's son, Krystal Delduca who was at the bedside Disposition Plan: Likely home in 48-72 hours Consults called: Dr. Wyline Mood from cardiology Admission status: Inpatient   Lahoma Crocker MD FACP [facility] Pager [phone]  If 7PM-7AM, please contact night-coverage [email or web address] Password [email or web address]  06/05/2017, 3:30 PM

## 2017-06-05 NOTE — ED Notes (Signed)
Paged Cardiology for consult

## 2017-06-05 NOTE — ED Provider Notes (Addendum)
[facility] 3E CHF Provider Note   CSN: [id number] Arrival date & time: 06/05/17  4098     History   Chief Complaint Chief Complaint  Patient presents with  . Shortness of Breath    HPI Zachary Hawkins is a 62 y.o. male.  HPI Patient presents with shortness of breath.  States that around 630 this morning woke up in a difficulty getting a breath.  States he was just unable to take the breath.  No pain.  Has had some improvement in shortness of breath but still does not feel normal.  No chest pain.  No swelling in his legs.  Over the last month he has been dealing with a cough with some mild sputum production.  No reported history of hypertension but is hypertensive here.  No hemoptysis. History reviewed. No pertinent past medical history.  Patient Active Problem List   Diagnosis Date Noted  . Dilated cardiomyopathy (HCC) 06/06/2017  . Hypertensive emergency without congestive heart failure 06/05/2017  . Dyspnea 06/05/2017    History reviewed. No pertinent surgical history.     Home Medications    Prior to Admission medications   Not on File    Family History Family History  Problem Relation Age of Onset  . Diabetes Mother   . Diabetes Father     Social History Social History   Tobacco Use  . Smoking status: Current Every Day Smoker    Packs/day: 1.00    Types: Cigarettes  . Smokeless tobacco: Never Used  Substance Use Topics  . Alcohol use: Yes    Comment: socially  . Drug use: Yes    Types: Marijuana     Allergies   Patient has no known allergies.   Review of Systems Review of Systems  Constitutional: Negative for appetite change.  HENT: Negative for congestion.   Respiratory: Positive for shortness of breath.   Cardiovascular: Negative for chest pain and leg swelling.  Gastrointestinal: Negative for abdominal pain.  Genitourinary: Negative for flank pain.  Musculoskeletal: Negative for back pain.  Skin: Negative for rash.   Neurological: Negative for numbness.  Hematological: Negative for adenopathy.  Psychiatric/Behavioral: Negative for confusion.     Physical Exam Updated Vital Signs BP (!) 153/101 (BP Location: Right Arm)   Pulse 85   Temp 97.9 F (36.6 C) (Oral)   Resp 20   Ht 6\' 1"  (1.854 m)   Wt 130.7 kg (288 lb 1.6 oz)   SpO2 100%   BMI 38.01 kg/m   Physical Exam  Constitutional: He appears well-developed.  HENT:  Head: Normocephalic.  Neck: No JVD present.  Cardiovascular: Normal rate and regular rhythm.  Pulmonary/Chest:  Wheezes bilateral bases.  May have few rales.  Abdominal: There is no tenderness.  Musculoskeletal:  Mild bilateral extremity pitting edema.  Neurological: He is alert.  Skin: Skin is warm. Capillary refill takes less than 2 seconds.  Psychiatric: He has a normal mood and affect.     ED Treatments / Results  Labs (all labs ordered are listed, but only abnormal results are displayed) Labs Reviewed  BRAIN NATRIURETIC PEPTIDE - Abnormal; Notable for the following components:      Result Value   B Natriuretic Peptide 117.2 (*)    All other components within normal limits  COMPREHENSIVE METABOLIC PANEL - Abnormal; Notable for the following components:   Glucose, Bld 112 (*)    BUN 23 (*)    Creatinine, Ser 1.68 (*)    GFR  calc non Af Amer 43 (*)    GFR calc Af Amer 50 (*)    All other components within normal limits  TROPONIN I - Abnormal; Notable for the following components:   Troponin I 0.04 (*)    All other components within normal limits  TROPONIN I - Abnormal; Notable for the following components:   Troponin I 0.03 (*)    All other components within normal limits  CREATININE, SERUM - Abnormal; Notable for the following components:   Creatinine, Ser 1.63 (*)    GFR calc non Af Amer 45 (*)    GFR calc Af Amer 52 (*)    All other components within normal limits  RAPID URINE DRUG SCREEN, HOSP PERFORMED - Abnormal; Notable for the following  components:   Cocaine POSITIVE (*)    All other components within normal limits  TROPONIN I - Abnormal; Notable for the following components:   Troponin I 0.04 (*)    All other components within normal limits  TROPONIN I - Abnormal; Notable for the following components:   Troponin I 0.04 (*)    All other components within normal limits  BASIC METABOLIC PANEL - Abnormal; Notable for the following components:   Glucose, Bld 110 (*)    Creatinine, Ser 1.39 (*)    Calcium 8.8 (*)    GFR calc non Af Amer 54 (*)    All other components within normal limits  TROPONIN I - Abnormal; Notable for the following components:   Troponin I 0.03 (*)    All other components within normal limits  MRSA PCR SCREENING  CBC WITH DIFFERENTIAL/PLATELET  D-DIMER, QUANTITATIVE (NOT [facility])  CBC  CBC  HIV ANTIBODY (ROUTINE TESTING)  CBC  RENAL FUNCTION PANEL    EKG  EKG Interpretation  Date/Time:  Sunday June 05 2017 11:41:34 EST Ventricular Rate:  55 PR Interval:    QRS Duration: 101 QT Interval:  399 QTC Calculation: 382 R Axis:   23 Text Interpretation:  Sinus arrhythmia Probable left atrial enlargement LVH with secondary repolarization abnormality Anterior ST elevation, probably due to LVH Baseline wander in lead(s) V2 nonspecific changes since tracings earlier today Confirmed by Benjiman CorePickering, Ruthvik Barnaby [phone]([id number]) on 06/05/2017 11:46:21 AM       Radiology Dg Chest 2 View  Result Date: 06/05/2017 CLINICAL DATA:  62 year old male with a history cough and congestion EXAM: CHEST  2 VIEW COMPARISON:  None. FINDINGS: Cardiomediastinal silhouette borderline enlarged. No evidence of central vascular congestion. Diffusely coarsened interstitial markings without confluent airspace disease. No pleural effusion. Stigmata of emphysema, with increased retrosternal airspace, flattened hemidiaphragms, increased AP diameter, and hyperinflation on the AP view. No pleural effusion or pneumothorax. IMPRESSION: Emphysema  and diffusely coarsened interstitial markings of the lungs without comparison. Findings may reflect chronic lung changes, however, atypical infection difficult to exclude. Electronically Signed   By: Gilmer MorJaime  Wagner D.O.   On: 06/05/2017 09:25    Procedures Procedures (including critical care time)  Medications Ordered in ED Medications  enoxaparin (LOVENOX) injection 40 mg (40 mg Subcutaneous Given 06/06/17 1849)  sodium chloride flush (NS) 0.9 % injection 3 mL (3 mLs Intravenous Given 06/06/17 1048)  acetaminophen (TYLENOL) tablet 650 mg (not administered)    Or  acetaminophen (TYLENOL) suppository 650 mg (not administered)  traMADol (ULTRAM) tablet 50 mg (50 mg Oral Given 06/06/17 0039)  senna-docusate (Senokot-S) tablet 1 tablet (not administered)  bisacodyl (DULCOLAX) EC tablet 5 mg (not administered)  ondansetron (ZOFRAN) tablet 4 mg (not administered)  Or  ondansetron (ZOFRAN) injection 4 mg (not administered)  aspirin EC tablet 81 mg (81 mg Oral Given 06/06/17 1037)  amLODipine (NORVASC) tablet 5 mg (5 mg Oral Given 06/06/17 1037)  enalaprilat (VASOTEC) injection 1.25 mg (1.25 mg Intravenous Given 06/06/17 0038)  ipratropium-albuterol (DUONEB) 0.5-2.5 (3) MG/3ML nebulizer solution (  Not Given 06/06/17 0542)  furosemide (LASIX) tablet 40 mg (40 mg Oral Given 06/06/17 1557)  carvedilol (COREG) tablet 3.125 mg (3.125 mg Oral Given 06/06/17 1849)  lisinopril (PRINIVIL,ZESTRIL) tablet 40 mg (40 mg Oral Given 06/06/17 1558)  budesonide (PULMICORT) nebulizer solution 0.25 mg (0.25 mg Nebulization Given 06/06/17 1954)  ipratropium-albuterol (DUONEB) 0.5-2.5 (3) MG/3ML nebulizer solution 3 mL (3 mLs Nebulization Given 06/05/17 0901)  nitroGLYCERIN (NITROSTAT) SL tablet 0.4 mg (0.4 mg Sublingual Given 06/05/17 0855)  aspirin chewable tablet 324 mg (324 mg Oral Given 06/05/17 1007)  labetalol (NORMODYNE,TRANDATE) injection 10 mg (10 mg Intravenous Given 06/05/17 1009)  metoprolol tartrate (LOPRESSOR)  tablet 50 mg (50 mg Oral Given 06/05/17 1043)  hydrALAZINE (APRESOLINE) injection 10 mg (10 mg Intravenous Given 06/05/17 1044)  albuterol (PROVENTIL) (2.5 MG/3ML) 0.083% nebulizer solution (2.5 mg  Given 06/05/17 1123)  heparin bolus via infusion 4,000 Units (4,000 Units Intravenous Bolus from Bag 06/05/17 1215)  hydrALAZINE (APRESOLINE) injection 10 mg (10 mg Intravenous Given 06/06/17 0325)  ipratropium-albuterol (DUONEB) 0.5-2.5 (3) MG/3ML nebulizer solution 3 mL (3 mLs Nebulization Given 06/06/17 0542)     Initial Impression / Assessment and Plan / ED Course  I have reviewed the triage vital signs and the nursing notes.  Pertinent labs & imaging results that were available during my care of the patient were reviewed by me and considered in my medical decision making (see chart for details).  Clinical Course as of Jun 07 2307  Wynelle Link Jun 05, 2017  1412 Made first contact with patient in [facility] ED as soon as patient arrived from [facility].  Denies dizziness, chest pain, shortness of breath, or any other current complaints.  [SJ]  1450 Spoke with Dr. Wyline Mood, cardiology, to notify him for patient's arrival in [facility] ED. States they will come evaluate the patient.  [SJ]  1600 Spoke with Dr. Royann Shivers, cardiologist, after his evaluation of the patient. States he does not think patient's symptoms are due to ACS and patient does not need to be on heparin. Bradycardic episodes may have been due to sleep apnea.  [SJ]    Clinical Course User Index [SJ] Joy, Shawn C, PA-C    Patient with shortness of breath.  Began somewhat acutely this morning but is been coughing for the last month.  Does have a rather severe hypertension here with a pressure of 200/150 initially.  No chest pain.  Does have shortness of breath but wheezes have cleared up after breathing treatment.  BNP mildly elevated.  Creatinine up from recent 1.4 a year ago to 1.7 now.  Troponin is minimally elevated at 0.04.  However with the severe  hypertension potential acute endorgan damage and no real primary care doctor I think patient would benefit from admission to the hospital for more urgent control of the blood pressure.  Blood pressure initially improved somewhat with sublingual nitroglycerin but will give IV labetalol now.  Will admit to hospitalist.  Blood pressure has come down somewhat to 160/100.  Patient states he is beginning to feel worse again.  Has had breathing treatment which is not helped.  States there is now more a feeling that he cannot take a deep  breath.  It is on his left chest.  Will repeat troponin and heparin and add a dimer due to the difficulty breathing.  Patient has had a few episodes of lightheadedness/decreased responsiveness with bradycardic pauses.  Blood pressure is improved and heart rate does go back up to normal heart rate but does have episodes going down to 20s or 30s.  Could be secondary to the metoprolol that he had been given.  EKG is repeated.  Reviewed by myself and Dr. Wyline Mood from cardiology.  Repeat troponin is stable at 0.03.  D-dimer had clotted and is being repeated.  Since there is no cardiology backup here in case of more severe bradycardia patient will be transferred to [facility] ER as a stepdown hold.  I discussed with Dr. Willette Pa, who was the admitting physician.  Also discussed with Dr. Jeraldine Loots in the ER.  CRITICAL CARE Performed by: Benjiman Core Total critical care time: 30 minutes Critical care time was exclusive of separately billable procedures and treating other patients. Critical care was necessary to treat or prevent imminent or life-threatening deterioration. Critical care was time spent personally by me on the following activities: development of treatment plan with patient and/or surrogate as well as nursing, discussions with consultants, evaluation of patient's response to treatment, examination of patient, obtaining history from patient or surrogate, ordering and performing  treatments and interventions, ordering and review of laboratory studies, ordering and review of radiographic studies, pulse oximetry and re-evaluation of patient's condition.   Final Clinical Impressions(s) / ED Diagnoses   Final diagnoses:  Hypertensive urgency  Upper respiratory tract infection, unspecified type  Renal insufficiency    ED Discharge Orders    None       Benjiman Core, MD 06/05/17 1004    Benjiman Core, MD 06/05/17 1152    Benjiman Core, MD 06/05/17 1244    Benjiman Core, MD 06/05/17 1541    Benjiman Core, MD 06/06/17 2310

## 2017-06-06 ENCOUNTER — Inpatient Hospital Stay (HOSPITAL_COMMUNITY): Payer: Self-pay

## 2017-06-06 DIAGNOSIS — I161 Hypertensive emergency: Secondary | ICD-10-CM

## 2017-06-06 DIAGNOSIS — I5041 Acute combined systolic (congestive) and diastolic (congestive) heart failure: Secondary | ICD-10-CM

## 2017-06-06 DIAGNOSIS — N179 Acute kidney failure, unspecified: Secondary | ICD-10-CM

## 2017-06-06 DIAGNOSIS — I130 Hypertensive heart and chronic kidney disease with heart failure and stage 1 through stage 4 chronic kidney disease, or unspecified chronic kidney disease: Secondary | ICD-10-CM

## 2017-06-06 DIAGNOSIS — I420 Dilated cardiomyopathy: Secondary | ICD-10-CM

## 2017-06-06 DIAGNOSIS — I429 Cardiomyopathy, unspecified: Secondary | ICD-10-CM

## 2017-06-06 LAB — BASIC METABOLIC PANEL
ANION GAP: 10 (ref 5–15)
BUN: 20 mg/dL (ref 6–20)
CALCIUM: 8.8 mg/dL — AB (ref 8.9–10.3)
CO2: 23 mmol/L (ref 22–32)
CREATININE: 1.39 mg/dL — AB (ref 0.61–1.24)
Chloride: 104 mmol/L (ref 101–111)
GFR, EST NON AFRICAN AMERICAN: 54 mL/min — AB (ref >60)
Glucose, Bld: 110 mg/dL — ABNORMAL HIGH (ref 65–99)
Potassium: 4.3 mmol/L (ref 3.5–5.1)
Sodium: 137 mmol/L (ref 135–145)

## 2017-06-06 LAB — CBC
HCT: 48.2 % (ref 39.0–52.0)
HEMOGLOBIN: 16 g/dL (ref 13.0–17.0)
MCH: 30.4 pg (ref 26.0–34.0)
MCHC: 33.2 g/dL (ref 30.0–36.0)
MCV: 91.5 fL (ref 78.0–100.0)
Platelets: 197 10*3/uL (ref 150–400)
RBC: 5.27 MIL/uL (ref 4.22–5.81)
RDW: 14.6 % (ref 11.5–15.5)
WBC: 5.5 10*3/uL (ref 4.0–10.5)

## 2017-06-06 LAB — ECHOCARDIOGRAM COMPLETE
Height: 73 in
Weight: 4609.6 oz

## 2017-06-06 LAB — TROPONIN I
TROPONIN I: 0.03 ng/mL — AB (ref <0.03)
Troponin I: 0.04 ng/mL (ref <0.03)

## 2017-06-06 MED ORDER — HYDRALAZINE HCL 20 MG/ML IJ SOLN
10.0000 mg | Freq: Once | INTRAMUSCULAR | Status: AC
  Administered 2017-06-06: 10 mg via INTRAVENOUS
  Filled 2017-06-06: qty 1

## 2017-06-06 MED ORDER — BUDESONIDE 0.25 MG/2ML IN SUSP
0.2500 mg | Freq: Two times a day (BID) | RESPIRATORY_TRACT | Status: DC
  Administered 2017-06-06 – 2017-06-07 (×2): 0.25 mg via RESPIRATORY_TRACT
  Filled 2017-06-06 (×2): qty 2

## 2017-06-06 MED ORDER — FUROSEMIDE 40 MG PO TABS
40.0000 mg | ORAL_TABLET | Freq: Every day | ORAL | Status: DC
  Administered 2017-06-06 – 2017-06-07 (×2): 40 mg via ORAL
  Filled 2017-06-06 (×2): qty 1

## 2017-06-06 MED ORDER — IPRATROPIUM-ALBUTEROL 0.5-2.5 (3) MG/3ML IN SOLN
3.0000 mL | RESPIRATORY_TRACT | Status: AC
  Administered 2017-06-06: 3 mL via RESPIRATORY_TRACT

## 2017-06-06 MED ORDER — LISINOPRIL 40 MG PO TABS
40.0000 mg | ORAL_TABLET | Freq: Every day | ORAL | Status: DC
  Administered 2017-06-06 – 2017-06-07 (×2): 40 mg via ORAL
  Filled 2017-06-06 (×2): qty 1

## 2017-06-06 MED ORDER — IPRATROPIUM-ALBUTEROL 0.5-2.5 (3) MG/3ML IN SOLN
RESPIRATORY_TRACT | Status: AC
  Filled 2017-06-06: qty 3

## 2017-06-06 MED ORDER — CARVEDILOL 3.125 MG PO TABS
3.1250 mg | ORAL_TABLET | Freq: Two times a day (BID) | ORAL | Status: DC
  Administered 2017-06-06 – 2017-06-07 (×2): 3.125 mg via ORAL
  Filled 2017-06-06 (×2): qty 1

## 2017-06-06 NOTE — Progress Notes (Signed)
  Echocardiogram 2D Echocardiogram has been performed.  Zachary Hawkins, Zachary Hawkins 06/06/2017, 1:08 PM

## 2017-06-06 NOTE — Significant Event (Signed)
Rapid Response Event Note  Overview: Call Time 505 Arrival Time 508   Initial Focused Assessment: Shortness of Breath  Per RN, patient stated he felt like he was short of breath. When I arrived, patient was alert and oriented, he felt like he was congested, patient was not in respiratory, lung sounds were clear.  RN informed me that patient has had pauses as well. Skin warm to touch, SBP 140s, HR when I was there 70-80s. RR 18, equal chest rise, no wheezing, no WOB.   Sleep Apnea > ?  Interventions: -- DouNeb x 1 -- Continuous pulse ox with tele monitoring ( can correlate HR with sats) -- CPAP at night ordered  Plan of Care (if not transferred): -- NP updated, patient stated that he felt better after receiving DuoNed  Event Summary:   at   End Time 545  Eisley Barber R

## 2017-06-06 NOTE — Progress Notes (Signed)
During night while sleeping patients heart was rate dropping to 20's.  On call notified.  Patient called out at one point stated he was short of breath this writer assessed increased 02, as well as elevating head of bed to help with breathing, and notified Rapid response nurse of concern.  Puja responded promptly and ordered breathing treatment to aid in breathing as well as pulse oximeter ( to help with determining if pulse decreasing d/t apnea.  After breathing treatment  patient stated he felt much better. Report passed on to am RN.

## 2017-06-06 NOTE — Progress Notes (Signed)
CRITICAL VALUE ALERT  Critical Value:  Troponin of 0.03  Date & Time Notied:  06/06/2017 @ 0730  Provider Notified: paged MD

## 2017-06-06 NOTE — Plan of Care (Signed)
  Progressing Safety: Ability to remain free from injury will improve 06/06/2017 0624 - Progressing by Myna BrightSmith, Nellie Pester Y, RN

## 2017-06-06 NOTE — Progress Notes (Signed)
Progress Note  Patient Name: Zachary Hawkins L Fizer Date of Encounter: 06/06/2017  Primary Cardiologist: Thurmon FairMihai Ilia Dimaano, MD   Subjective   Pt has mild DOE with walking to the bathroom this am. No chest pain, palpitations, lightheadedness.   Inpatient Medications    Scheduled Meds: . amLODipine  5 mg Oral Daily  . aspirin EC  81 mg Oral Daily  . carvedilol  6.25 mg Oral BID WC  . enoxaparin (LOVENOX) injection  40 mg Subcutaneous Q24H  . ipratropium-albuterol      . sodium chloride flush  3 mL Intravenous Q12H   Continuous Infusions:  PRN Meds: acetaminophen **OR** acetaminophen, bisacodyl, enalaprilat, ondansetron **OR** ondansetron (ZOFRAN) IV, senna-docusate, traMADol   Vital Signs    Vitals:   06/06/17 0304 06/06/17 0355 06/06/17 0452 06/06/17 0522  BP: (!) 179/107 (!) 159/86  (!) 146/98  Pulse: 75 68    Resp:      Temp:      TempSrc:      SpO2:    99%  Weight:   288 lb 1.6 oz (130.7 kg)   Height:        Intake/Output Summary (Last 24 hours) at 06/06/2017 1005 Last data filed at 06/06/2017 0300 Gross per 24 hour  Intake 240 ml  Output 500 ml  Net -260 ml   Filed Weights   06/05/17 0837 06/05/17 2022 06/06/17 0452  Weight: 290 lb (131.5 kg) 294 lb 6.4 oz (133.5 kg) 288 lb 1.6 oz (130.7 kg)    Telemetry    Sinus rhythm in the 80's-90's. Between 0250 and 0300 had cyclical drops into the 40's-50's with a few non conducted beats - Personally Reviewed  ECG    Normal sinus rhythm with sinus arrhythmia, 84 bpm, Moderate voltage criteria for LVH, may be normal variant - Personally Reviewed  Physical Exam   GEN: No acute distress.   Neck: No JVD Cardiac: RRR, no murmurs, rubs, or gallops.  Respiratory: Clear to auscultation bilaterally. GI: Soft, nontender, non-distended  MS: No edema; No deformity. Neuro:  Nonfocal  Psych: Normal affect   Labs    Chemistry Recent Labs  Lab 06/05/17 0848 06/05/17 1714 06/06/17 0533  NA 138  --  137  K 4.5  --  4.3    CL 104  --  104  CO2 28  --  23  GLUCOSE 112*  --  110*  BUN 23*  --  20  CREATININE 1.68* 1.63* 1.39*  CALCIUM 9.0  --  8.8*  PROT 7.4  --   --   ALBUMIN 3.9  --   --   AST 24  --   --   ALT 24  --   --   ALKPHOS 61  --   --   BILITOT 0.6  --   --   GFRNONAA 43* 45* 54*  GFRAA 50* 52* >60  ANIONGAP 6  --  10     Hematology Recent Labs  Lab 06/05/17 0848 06/05/17 1714 06/06/17 0533  WBC 5.6 6.9 5.5  RBC 5.39 5.14 5.27  HGB 16.2 15.7 16.0  HCT 48.2 47.1 48.2  MCV 89.4 91.6 91.5  MCH 30.1 30.5 30.4  MCHC 33.6 33.3 33.2  RDW 14.9 14.8 14.6  PLT 204 208 197    Cardiac Enzymes Recent Labs  Lab 06/05/17 1153 06/05/17 1714 06/05/17 2319 06/06/17 0533  TROPONINI 0.03* 0.04* 0.04* 0.03*   No results for input(s): TROPIPOC in the last 168 hours.   BNP Recent  Labs  Lab 06/05/17 0848  BNP 117.2*     DDimer  Recent Labs  Lab 06/05/17 1222  DDIMER <0.27     Radiology    Dg Chest 2 View  Result Date: 06/05/2017 CLINICAL DATA:  62 year old male with a history cough and congestion EXAM: CHEST  2 VIEW COMPARISON:  None. FINDINGS: Cardiomediastinal silhouette borderline enlarged. No evidence of central vascular congestion. Diffusely coarsened interstitial markings without confluent airspace disease. No pleural effusion. Stigmata of emphysema, with increased retrosternal airspace, flattened hemidiaphragms, increased AP diameter, and hyperinflation on the AP view. No pleural effusion or pneumothorax. IMPRESSION: Emphysema and diffusely coarsened interstitial markings of the lungs without comparison. Findings may reflect chronic lung changes, however, atypical infection difficult to exclude. Electronically Signed   By: Gilmer Mor D.O.   On: 06/05/2017 09:25    Cardiac Studies   Echo pending.   Patient Profile     62 y.o. male with a hx of untreated severe HTN who is being seen today for the evaluation of bradycardia and abnormal troponin. No chest discomfort or  recent exertional symptoms. Transferred from [facility] to [facility].   Assessment & Plan    1. Severe Hypertension -Has not had health care recently, untreated HTN. -BP 200/150 on presentation -Evidence of renal impairment and LVH (probably has diastolic dysfunction but no fluid overload) -Was given NTG and IV BB in ED and developed symptomatic bradycardia with rates in the 20's-30's and occ pauses.  -Previously on amlodipine. Carvedilol 6.25 mg added. Has received 2 doses of prn enalapril.  -BP still elevated but improved. 153/105 this am.  -Add oral ACE-I or ARB. -Tolerating BB. Did have some cyclical drops in heart rate from 80's to 40's to 50's between 0250 and 0300- may be related to OSA. Don't think this is related to BB.   2. CHF -Pt with shortness of breath with cough on presentation -BNP 117.2 -Likely diastolic dysfunction with LVH. Echo pending  3. CKD stage 3 -SCr 1.39, down from 1.63 yesterday. SCr was 1.40 in 01/2016.  -May improve with better BP control.   4. Abnormal troponin -Troponins very mildly elevated in flat trend- 0.03, 0.04 -No chest pain -Likely related to CHF and severe hypertension  5. Probable OSA -Suspect OSA that my have played a part in her episode of bradycardiac at [facility] and last night had cyclical drops from the 80's to 40's-50's and a couple of non-conducted beats.  -His sister uses CPAP.  -Needs sleep study but is uninsured. Discussed wt loss, avoiding sleeping in supine position.    6. Obesity -Wt loss is advised for the treatment of HTN, CHF and OSA  7. Tobacco use -Smoke about 3/4 PPD. States that he is motivated to quit as he wants to live.   For questions or updates, please contact [facility] Please consult [email or web address] for contact info under Cardiology/STEMI.      Signed, Berton Bon, NP  06/06/2017, 10:05 AM    I have seen and examined the patient along with Berton Bon, NP.  I have reviewed the chart, notes and  new data.  I agree with PA/NP's note.  Key new complaints: feels well Key examination changes: no overt CHF. BP improving. Occasional nocturnal transient sinus bradycardia highly suggestive of sleep apnea events. Key new findings / data: improving creatinine  Preliminary review of the echo shows moderate LVH and global LV dysfunction, EF around 40-45%, elevated filling pressures, no serious valvular abnormalities.  PLAN: Continue titrating BP  meds, preferring carvedilol and ARB due to low EF. When all is said and done, suspect he will be on 4 agents (carvedilol, ARB, amlodipine and diuretic). Will start furosemide today as echo shows evidence of volume overload. Cost is a big problem for him and he is not insured. [facility] to [facility]. They might also facilitate sleep study and access to CPAP if OSA is confirmed. Strongly suspect that LVEF will recover with good BP control,  in a few months. If it does not, needs CAD workup.  Thurmon Fair, MD, [facility] [phone] 06/06/2017, 12:57 PM

## 2017-06-06 NOTE — Progress Notes (Signed)
Pt blood pressure up 184/98 vasotec given as prn as ordered.  One hour later bp-168/117 pt sleeping awakened when vs being taken.  Central tele report pt had 3.8 sec pause on call MD notified.  BP-@ 0304 179/107 patient awake by verbal and tactile stimuli on call notified of events. No new orders at this time.

## 2017-06-06 NOTE — Progress Notes (Signed)
PROGRESS NOTE    Zachary Hawkins  ZOX:[id number] DOB: 04/24/1956 DOA: 06/05/2017 PCP: System, Provider Not In  Outpatient Specialists:   Brief Narrative: Patient is a 62 y.o. male with no significant past medical history.  Patient does not have a primary care provider and, therefore, has no been seen by a doctor in a long time.  The patient is morbidly obese, snores at nighttime, but has never been checked for obstructive sleep apnea.  The patient has also smoked 3/4 pack of cigarettes for very long time.  Patient only admitted to occasional alcohol use.  Currently, patient only smokes marijuana, and denies other illicit substances.  Patient was admitted with hypertensive urgency, acute kidney injury acute kidney injury on chronic kidney disease II, and significant shortness of breath.  On further questioning, the patient admitted dyspnea on exertion, two-pillow orthopnea and the patient has associated leg edema.  Patient is a poor historian.  Patient's blood pressure on presentation range from 199- 210/128-161mmHg.  Patient is currently on amlodipine, Lasix and Coreg.  Last blood pressure was 156/105 mmHg.  On admission, the patient's serum creatinine was 1.7, up from 1.4 a year ago. Serum creatinine is back to baseline.  Echo done  Revealed - estimated ejection fraction was in the range of 40% to 45%,  moderate concentric hypertrophy, Mild diffuse hypokinesis with no identifiable regional variations (Features consistent with a pseudonormal left ventricular filling pattern, with concomitant  abnormal relaxation and increased filling pressure (grade 2 diastolic dysfunction).  Will add an ACE inhibitor.  Will monitor patient's renal function and electrolytes in the morning.  Assessment & Plan:   Principal Problem:   Hypertensive emergency without congestive heart failure Active Problems:   Dyspnea   Hypertensive urgency. -Cautious control of blood pressure. - Will decrease Coreg to 3.125 mg p.o.  twice daily from 6.25 mg p.o. twice daily. -Will add an ACE inhibitor. -We will continue to monitor renal function closely as well as electrolytes. -If the renal function and the electrolytes remained stable, and the patient continues to have recurrent symptoms of congestive heart failure, will consider changing an ACE inhibitor to Entresto.  Will defer this to the patient's primary care provider and cardiologist. -For now, the blood pressure is patient is cautiously been optimized. -Continue amlodipine and diuretics.  Likely congestive heart failure, combined systolic and diastolic dysfunction, likely acute on chronic. -Continue above management. -Cardiac diet. -Echo report is noted. -Is inhibitor has been ordered.  The -Patient is already on Coreg. -Continue diuresis.  Likely undiagnosed OSA and obesity hypoventilation syndrome. -The patient would need outpatient sleep study. -Need for a sleep study has been discussed with the patient and the patient sister, and they both voiced understanding.  Tobacco use, continuous, with possible undiagnosed COPD. -I have counseled patient regarding need to quit cigarette use. -We will continue duo nebs for now. -We will add nebs Pulmicort as the patient is wheezing. -Patient may benefit from formal pulmonary function tests on discharge.  Morbid obesity. -Diet and exercise.  Possible non-compliance. -The patient has not followed up with a primary care provider in so many years. -Need to have the PCP was discussed with the patient extensively. - Patient already understands need to-mid with case management social worker to discuss financial issues. -Patient will need to follow with PCP and cardiology on discharge.  Hopefully, the patient's PCP will refer him to a pulmonologist.  DVT prophylaxis: Subcutaneous Lovenox. Code Status: Full code. Family Communication: Sister. Disposition Plan: Hopefully, patient will  be discharged back home in the  morning.   Consultants:   None.  Procedures:   Echo.  Please see above.  Antimicrobials:   None.   Subjective: Patient is now back to his baseline. Blood pressure is not optimized. The patient still has ankle edema.  Objective: Vitals:   06/06/17 0355 06/06/17 0452 06/06/17 0522 06/06/17 1031  BP: (!) 159/86  (!) 146/98 (!) 156/105  Pulse: 68   81  Resp:      Temp:      TempSrc:      SpO2:   99% 99%  Weight:  130.7 kg (288 lb 1.6 oz)    Height:        Intake/Output Summary (Last 24 hours) at 06/06/2017 1331 Last data filed at 06/06/2017 0300 Gross per 24 hour  Intake 240 ml  Output 400 ml  Net -160 ml   Filed Weights   06/05/17 0837 06/05/17 2022 06/06/17 0452  Weight: 131.5 kg (290 lb) 133.5 kg (294 lb 6.4 oz) 130.7 kg (288 lb 1.6 oz)    Examination:  General exam: Appears calm and comfortable  Respiratory system: Clear to auscultation. Respiratory effort normal. Cardiovascular system: S1 & S2 heard, RRR. No JVD, murmurs, rubs, gallops or clicks. No pedal edema. Gastrointestinal system: Abdomen is nondistended, soft and nontender. No organomegaly or masses felt. Normal bowel sounds heard. Central nervous system: Alert and oriented. No focal neurological deficits. Extremities: Symmetric 5 x 5 power. Skin: No rashes, lesions or ulcers Psychiatry: Judgement and insight appear normal. Mood & affect appropriate.     Data Reviewed: I have personally reviewed following labs and imaging studies  CBC: Recent Labs  Lab 06/05/17 0848 06/05/17 1714 06/06/17 0533  WBC 5.6 6.9 5.5  NEUTROABS 2.6  --   --   HGB 16.2 15.7 16.0  HCT 48.2 47.1 48.2  MCV 89.4 91.6 91.5  PLT 204 208 197   Basic Metabolic Panel: Recent Labs  Lab 06/05/17 0848 06/05/17 1714 06/06/17 0533  NA 138  --  137  K 4.5  --  4.3  CL 104  --  104  CO2 28  --  23  GLUCOSE 112*  --  110*  BUN 23*  --  20  CREATININE 1.68* 1.63* 1.39*  CALCIUM 9.0  --  8.8*   GFR: Estimated  Creatinine Clearance: 81.1 mL/min (A) (by C-G formula based on SCr of 1.39 mg/dL (H)). Liver Function Tests: Recent Labs  Lab 06/05/17 0848  AST 24  ALT 24  ALKPHOS 61  BILITOT 0.6  PROT 7.4  ALBUMIN 3.9   No results for input(s): LIPASE, AMYLASE in the last 168 hours. No results for input(s): AMMONIA in the last 168 hours. Coagulation Profile: No results for input(s): INR, PROTIME in the last 168 hours. Cardiac Enzymes: Recent Labs  Lab 06/05/17 0848 06/05/17 1153 06/05/17 1714 06/05/17 2319 06/06/17 0533  TROPONINI 0.04* 0.03* 0.04* 0.04* 0.03*   BNP (last 3 results) No results for input(s): PROBNP in the last 8760 hours. HbA1C: No results for input(s): HGBA1C in the last 72 hours. CBG: No results for input(s): GLUCAP in the last 168 hours. Lipid Profile: No results for input(s): CHOL, HDL, LDLCALC, TRIG, CHOLHDL, LDLDIRECT in the last 72 hours. Thyroid Function Tests: No results for input(s): TSH, T4TOTAL, FREET4, T3FREE, THYROIDAB in the last 72 hours. Anemia Panel: No results for input(s): VITAMINB12, FOLATE, FERRITIN, TIBC, IRON, RETICCTPCT in the last 72 hours. Urine analysis: No results found for: COLORURINE, APPEARANCEUR,  LABSPEC, PHURINE, GLUCOSEU, HGBUR, BILIRUBINUR, KETONESUR, PROTEINUR, UROBILINOGEN, NITRITE, LEUKOCYTESUR Sepsis Labs: @LABRCNTIP (procalcitonin:4,lacticidven:4)  )No results found for this or any previous visit (from the past 240 hour(s)).       Radiology Studies: Dg Chest 2 View  Result Date: 06/05/2017 CLINICAL DATA:  62 year old male with a history cough and congestion EXAM: CHEST  2 VIEW COMPARISON:  None. FINDINGS: Cardiomediastinal silhouette borderline enlarged. No evidence of central vascular congestion. Diffusely coarsened interstitial markings without confluent airspace disease. No pleural effusion. Stigmata of emphysema, with increased retrosternal airspace, flattened hemidiaphragms, increased AP diameter, and hyperinflation on  the AP view. No pleural effusion or pneumothorax. IMPRESSION: Emphysema and diffusely coarsened interstitial markings of the lungs without comparison. Findings may reflect chronic lung changes, however, atypical infection difficult to exclude. Electronically Signed   By: Gilmer Mor D.O.   On: 06/05/2017 09:25        Scheduled Meds: . amLODipine  5 mg Oral Daily  . aspirin EC  81 mg Oral Daily  . carvedilol  6.25 mg Oral BID WC  . enoxaparin (LOVENOX) injection  40 mg Subcutaneous Q24H  . furosemide  40 mg Oral Daily  . ipratropium-albuterol      . sodium chloride flush  3 mL Intravenous Q12H   Continuous Infusions:   LOS: 1 day    Time spent: 45 minutes.    Berton Mount, MD  [facility] Pager #: [phone] 7PM-7AM contact night coverage as above

## 2017-06-06 NOTE — Progress Notes (Signed)
Patients HR dropped to 33, non sustaining, will continue to monitor patient.

## 2017-06-07 ENCOUNTER — Telehealth: Payer: Self-pay | Admitting: Cardiology

## 2017-06-07 DIAGNOSIS — I420 Dilated cardiomyopathy: Secondary | ICD-10-CM

## 2017-06-07 DIAGNOSIS — I5042 Chronic combined systolic (congestive) and diastolic (congestive) heart failure: Secondary | ICD-10-CM

## 2017-06-07 DIAGNOSIS — I160 Hypertensive urgency: Secondary | ICD-10-CM

## 2017-06-07 DIAGNOSIS — N1831 Chronic kidney disease, stage 3a: Secondary | ICD-10-CM

## 2017-06-07 DIAGNOSIS — N179 Acute kidney failure, unspecified: Secondary | ICD-10-CM

## 2017-06-07 DIAGNOSIS — E669 Obesity, unspecified: Secondary | ICD-10-CM

## 2017-06-07 DIAGNOSIS — N183 Chronic kidney disease, stage 3 (moderate): Secondary | ICD-10-CM

## 2017-06-07 LAB — CBC
HCT: 49.7 % (ref 39.0–52.0)
HEMOGLOBIN: 16.4 g/dL (ref 13.0–17.0)
MCH: 30.3 pg (ref 26.0–34.0)
MCHC: 33 g/dL (ref 30.0–36.0)
MCV: 91.7 fL (ref 78.0–100.0)
Platelets: 203 10*3/uL (ref 150–400)
RBC: 5.42 MIL/uL (ref 4.22–5.81)
RDW: 14.8 % (ref 11.5–15.5)
WBC: 5.3 10*3/uL (ref 4.0–10.5)

## 2017-06-07 LAB — RENAL FUNCTION PANEL
Albumin: 3.3 g/dL — ABNORMAL LOW (ref 3.5–5.0)
Anion gap: 9 (ref 5–15)
BUN: 16 mg/dL (ref 6–20)
CO2: 27 mmol/L (ref 22–32)
Calcium: 8.9 mg/dL (ref 8.9–10.3)
Chloride: 102 mmol/L (ref 101–111)
Creatinine, Ser: 1.52 mg/dL — ABNORMAL HIGH (ref 0.61–1.24)
GFR calc Af Amer: 56 mL/min — ABNORMAL LOW (ref >60)
GFR calc non Af Amer: 48 mL/min — ABNORMAL LOW (ref >60)
Glucose, Bld: 118 mg/dL — ABNORMAL HIGH (ref 65–99)
Phosphorus: 3.4 mg/dL (ref 2.5–4.6)
Potassium: 4.3 mmol/L (ref 3.5–5.1)
Sodium: 138 mmol/L (ref 135–145)

## 2017-06-07 LAB — HIV ANTIBODY (ROUTINE TESTING W REFLEX): HIV SCREEN 4TH GENERATION: NONREACTIVE

## 2017-06-07 LAB — MRSA PCR SCREENING: MRSA by PCR: NEGATIVE

## 2017-06-07 MED ORDER — ISOSORBIDE DINITRATE 20 MG PO TABS: 20.0000 mg | ORAL_TABLET | Freq: Three times a day (TID) | ORAL | 1 refills | Status: DC

## 2017-06-07 MED ORDER — HYDRALAZINE HCL 25 MG PO TABS: 37.5000 mg | ORAL_TABLET | Freq: Three times a day (TID) | ORAL | 11 refills | Status: DC

## 2017-06-07 MED ORDER — FUROSEMIDE 40 MG PO TABS: 40.0000 mg | ORAL_TABLET | Freq: Every day | ORAL | 0 refills | Status: DC

## 2017-06-07 MED ORDER — CARVEDILOL 3.125 MG PO TABS: 3.1250 mg | ORAL_TABLET | Freq: Two times a day (BID) | ORAL | 1 refills | Status: DC

## 2017-06-07 MED ORDER — ASPIRIN 81 MG PO TBEC: 81.0000 mg | DELAYED_RELEASE_TABLET | Freq: Every day | ORAL | 1 refills | Status: DC

## 2017-06-07 MED FILL — ?FUROSEMIDE 40 MG TABLET: 40 | 30 days supply | Qty: 30 | Fill #0

## 2017-06-07 MED FILL — ISOSORBIDE DN 20 MG TABLET: 20 | 30 days supply | Qty: 90 | Fill #0

## 2017-06-07 MED FILL — ?HYDRALAZINE 25MG TAB: 25 | 26 days supply | Qty: 120 | Fill #0

## 2017-06-07 MED FILL — ?CARVEDILOL 3.125 MG TABLET: 3.125 | 30 days supply | Qty: 60 | Fill #0

## 2017-06-07 NOTE — Plan of Care (Signed)
  Education: Knowledge of General Education information will improve 06/07/2017 0133 - Progressing by Jeanella Flatteryhomas, Tawonda Legaspi T, RN   Clinical Measurements: Respiratory complications will improve 06/07/2017 0133 - Progressing by Jeanella Flatteryhomas, Raelyn Racette T, RN

## 2017-06-07 NOTE — Telephone Encounter (Signed)
TOC Patient-Please call Patient-Pt has an appointment with Franky MachoLuke on 06-22-17.

## 2017-06-07 NOTE — Telephone Encounter (Signed)
Patient is still currently admitted. Need to contact on 06/08/17

## 2017-06-07 NOTE — Progress Notes (Signed)
Progress Note  Patient Name: Zachary Hawkins Date of Encounter: 06/07/2017  Primary Cardiologist: Thurmon Fair, MD   Subjective   Pt without chest pain or shortness of breath. Seems to be tolerating his med, is a little tired.   Inpatient Medications    Scheduled Meds: . amLODipine  5 mg Oral Daily  . aspirin EC  81 mg Oral Daily  . budesonide (PULMICORT) nebulizer solution  0.25 mg Nebulization BID  . carvedilol  3.125 mg Oral BID WC  . enoxaparin (LOVENOX) injection  40 mg Subcutaneous Q24H  . furosemide  40 mg Oral Daily  . lisinopril  40 mg Oral Daily  . sodium chloride flush  3 mL Intravenous Q12H   Continuous Infusions:  PRN Meds: acetaminophen **OR** acetaminophen, bisacodyl, enalaprilat, ondansetron **OR** ondansetron (ZOFRAN) IV, senna-docusate, traMADol   Vital Signs    Vitals:   06/06/17 2013 06/06/17 2322 06/07/17 0504 06/07/17 0859  BP: (!) 153/101 (!) 142/79 (!) 157/96 (!) 127/100  Pulse: 85 81 78   Resp: 20 20 20    Temp: 97.9 F (36.6 C) 97.6 F (36.4 C) 98.2 F (36.8 C)   TempSrc: Oral Oral Oral   SpO2: 100% 99% 99%   Weight:   285 lb 4.8 oz (129.4 kg)   Height:        Intake/Output Summary (Last 24 hours) at 06/07/2017 0947 Last data filed at 06/07/2017 0855 Gross per 24 hour  Intake 480 ml  Output 1325 ml  Net -845 ml   Filed Weights   06/05/17 2022 06/06/17 0452 06/07/17 0504  Weight: 294 lb 6.4 oz (133.5 kg) 288 lb 1.6 oz (130.7 kg) 285 lb 4.8 oz (129.4 kg)    Telemetry    Sinus rhythm in the 80's, one short < episode of bradycardia at 2247 last night.  - Personally Reviewed  ECG    No new tracings - Personally Reviewed  Physical Exam   GEN: No acute distress.   Neck: No JVD Cardiac: RRR, no murmurs, rubs, or gallops.  Respiratory: Clear to auscultation bilaterally. GI: Soft, nontender, non-distended  MS: No edema; No deformity. Neuro:  Nonfocal  Psych: Normal affect   Labs    Chemistry Recent Labs  Lab  06/05/17 0848 06/05/17 1714 06/06/17 0533 06/07/17 0554  NA 138  --  137 138  K 4.5  --  4.3 4.3  CL 104  --  104 102  CO2 28  --  23 27  GLUCOSE 112*  --  110* 118*  BUN 23*  --  20 16  CREATININE 1.68* 1.63* 1.39* 1.52*  CALCIUM 9.0  --  8.8* 8.9  PROT 7.4  --   --   --   ALBUMIN 3.9  --   --  3.3*  AST 24  --   --   --   ALT 24  --   --   --   ALKPHOS 61  --   --   --   BILITOT 0.6  --   --   --   GFRNONAA 43* 45* 54* 48*  GFRAA 50* 52* >60 56*  ANIONGAP 6  --  10 9     Hematology Recent Labs  Lab 06/05/17 1714 06/06/17 0533 06/07/17 0554  WBC 6.9 5.5 5.3  RBC 5.14 5.27 5.42  HGB 15.7 16.0 16.4  HCT 47.1 48.2 49.7  MCV 91.6 91.5 91.7  MCH 30.5 30.4 30.3  MCHC 33.3 33.2 33.0  RDW 14.8 14.6 14.8  PLT 208 197 203    Cardiac Enzymes Recent Labs  Lab 06/05/17 1153 06/05/17 1714 06/05/17 2319 06/06/17 0533  TROPONINI 0.03* 0.04* 0.04* 0.03*   No results for input(s): TROPIPOC in the last 168 hours.   BNP Recent Labs  Lab 06/05/17 0848  BNP 117.2*     DDimer  Recent Labs  Lab 06/05/17 1222  DDIMER <0.27     Radiology    No results found.  Cardiac Studies   Echocardiogram 06/06/2017 Study Conclusions  - Left ventricle: The cavity size was mildly dilated. There was   moderate concentric hypertrophy. Systolic function was mildly to   moderately reduced. The estimated ejection fraction was in the   range of 40% to 45%. Mild diffuse hypokinesis with no   identifiable regional variations. Features are consistent with a   pseudonormal left ventricular filling pattern, with concomitant   abnormal relaxation and increased filling pressure (grade 2   diastolic dysfunction).  Patient Profile     62 y.o. male with a hx of untreated severe HTNwho is being seen today for the evaluation of bradycardia and abnormal troponin. No chest discomfort or recent exertional symptoms. Transferred from [facility] to [facility].  Assessment & Plan    1. Severe  hypertension -BP 100/150 on presentation -Previously untreated. Hypertensive changes noted on echo with mildly dilated LV, moderate concentric hypertrophy, EF 40-45%, grade 2 DD. -Treatment initiated with amlodipine 5 mg, coreg 3.125 mg, lasix 40 mg daily, and lisinopril 40 mg -BP still elevated but improving. 127/100 this am.  -Discussed need to control BP to prevent future problems with pt. Will need PCP and medication assistance. Referral to case management placed.   2. Combine systolic and diastolic CHF -Likely related to severe hypertension -Echo findings as above. Hopefully will improve with adequate blood pressure control.  3. CKD stage 3 -SCR 1.63 on admission (was 1.40 in 01/2016). Down to 1.39, today up to 1.52, BUN 16.  4. Abnormal troponin -Troponins very mildly elevated in flat trend- 0.03, 0.04 -No chest pain -Likely related to CHF and severe hypertension  5. Probable OSA -Suspect OSA that my have played a part in her episode of bradycardiac at [facility] and last night had cyclical drops from the 80's to 40's-50's and a couple of non-conducted beats.  -His sister uses CPAP.  -Needs sleep study but is uninsured. Discussed wt loss, avoiding sleeping in supine position.  [facility] They might also facilitate sleep study and access to CPAP if OSA is confirmed.  6. Obesity -Wt loss is advised for the treatment of HTN, CHF and OSA  7. Tobacco use -Smoke about 3/4 PPD. States that he is motivated to quit as he wants to live.   For questions or updates, please contact [facility] Please consult [email or web address] for contact info under Cardiology/STEMI.      Signed, Berton BonJanine Hammond, NP  06/07/2017, 9:47 AM     I have seen and examined the patient along with Berton BonJanine Hammond, NP .  I have reviewed the chart, notes and new data.  I agree with PA's note.  Key new complaints: generally better, just tired Key examination changes:  no overt hypervolemia, BP still high, but greatly improved Key new findings / data: slight variation in renal function, seems to be at baseline. Echo findings reviewed in detail with him. Looks like "burned out" hypertensive cardiomyopathy, but with substantial residual LVH, good chance will see substantial recovery of EF  if we can control his BP. Occasional nocturnal bradycardia in a pattern suggestive of apneic episodes  PLAN: Plan to DC with full dose lisinopril 40 daily, as well as amlodipine 5 daily, carvedilol 6.25 twice daily and furosemide 40 mg daily. Will need repeat evaluation in a couple of weeks to see if further med titration is required. Access to Rx meds will be critical to prevent readmission and further worsening of his cardiomyopathy.  Discussed sodium restricted diet. Ideally, needs daily weight monitoring, bring log to doctor appt. He also needs evaluation for OSA, which may be harder to organize. Recommend complete smoking cessation and weight loss.  Thurmon Fair, MD, [facility] [phone] 06/07/2017, 10:40 AM

## 2017-06-07 NOTE — Discharge Summary (Signed)
Physician Discharge Summary  Patient ID: Zachary Hawkins MRN: [id number] DOB/AGE: Sep 20, 1955 62 y.o.  Admit date: 06/05/2017 Discharge date: 06/07/2017  Admission Diagnoses:  Discharge Diagnoses:  Principal Problem:   Hypertensive Urgency.   Acute on chronic combined systolic and diastolic congestive heart failure.   Likely undiagnosed obstructive sleep apnea and obesity hypoventilation syndrome.   Tobacco use, continuous, with possible undiagnosed COPD.   Morbid obesity.   Chronic kidney disease stage III versus acute on chronic kidney disease stage II.   Likely non-compliance.   Dyspnea   Dilated cardiomyopathy [facility](HCC)   Discharged Condition: stable  Hospital Course: Patient is a  62 year old African-American male withno known significant past medical history.  Patient does not have a primary care provider and, therefore, has not been seen by a doctor in a long time.  The patient is morbidly obese, snores at nighttime, but has never been checked for obstructive sleep apnea.  The patient has also smoked 3/4 pack of cigarettes for very long time.  Patient only admitted to occasional alcohol use.  Currently, patient only smokes marijuana, and denies other illicit substances.  Patient was admitted with hypertensive urgency, acute kidney injury on chronic kidney disease II, and significant shortness of breath.  On further questioning, the patient admitted dyspnea on exertion, two-pillow orthopnea and the patient has associated leg edema.  Patient is a poor historian. Patient's blood pressure on presentation ranged from 199- 210/128-15853mmHg.  Patient was managed with amlodipine, Lasix, lisinopril and Coreg during the hospital stay.  However, the patient does not have insurance.  Patient is financially challenged.  Base line renal function is not known at this point in time.   Based on above, the patient will be discharged back home on Coreg 3.125 mg twice daily, hydralazine 37.5 mg p.o. 3 times  daily, isosorbide dinitrate 20 mg p.o. 3 times daily and Lasix 40 mg p.o. once daily.  There will be need for the PCP to repeat patient's BMP in 2 days time.  Patient will also need to follow-up with the cardiology team within a week.  When the patient's renal function is back to baseline, lisinopril or Entresto may be considered.  Blood pressure done earlier today was 127/100 mmHg.  Shortness of breath has resolved significantly, as well as orthopnea and leg edema.  Patient has been optimized, and is eager to be discharged back home today.  There will be need to monitor patient's renal function closely.  Patient will also need sleep study and on outpatient basis.  Patient has also been counseled to quit cigarette use.  The patient will need to modify his diet.  Salt intake we need to be optimized to keep in line with congestive heart failure management.     Consults: Follow with cardiology on discharge.  Significant Diagnostic Studies: Echo -Left ventricle: The cavity size was mildly dilated. There was moderate concentric hypertrophy. Systolic function was mildly to moderately reduced. The estimated ejection fraction was in the range of 40% to 45%. Mild diffuse hypokinesis with no identifiable regional variations. Features are consistent with a pseudonormal left ventricular filling pattern, with concomitant abnormal relaxation and increased filling pressure (grade 2 diastolic dysfunction).  Lab work: BMP prior to discharge reveals sodium of 138, potassium of 4.3, chloride 102, CO2 27, BUN of 69 and a creatinine of 1.52 with a blood sugar of 118.  Troponin during the hospital stay remained stable at 0.03, and only peaked at 0.04.  Significantly, the patient was not having  any chest pain.  Treatments: See above.  Discharge Exam: Blood pressure (!) 127/100, pulse 78, temperature 98.2 F (36.8 C), temperature source Oral, resp. rate 20, height 6\' 1"  (1.854 m), weight 129.4 kg (285 lb 4.8 oz), SpO2 99  %.  Disposition: 01-Home or Self Care  Discharge Instructions    Call MD for:   Complete by:  As directed    Worsening symptoms   Diet - low sodium heart healthy   Complete by:  As directed    Discharge instructions   Complete by:  As directed    Please follow up with your PCP so the PCP can repeat BMP in 2 days   Increase activity slowly   Complete by:  As directed      Allergies as of 06/07/2017   No Known Allergies     Medication List    TAKE these medications   aspirin 81 MG EC tablet Take 1 tablet (81 mg total) by mouth daily. Start taking on:  06/08/2017   carvedilol 3.125 MG tablet Commonly known as:  COREG Take 1 tablet (3.125 mg total) by mouth 2 (two) times daily with a meal.   furosemide 40 MG tablet Commonly known as:  LASIX Take 1 tablet (40 mg total) by mouth daily.   hydrALAZINE 25 MG tablet Commonly known as:  APRESOLINE Take 1.5 tablets (37.5 mg total) by mouth 3 (three) times daily.   isosorbide dinitrate 20 MG tablet Commonly known as:  ISORDIL Take 1 tablet (20 mg total) by mouth 3 (three) times daily.      Follow-up Information    [facility] Follow up on 06/14/2017.   Why:  11 am for hospital follow up Contact information: [address] [place] [address] [phone]          Signed: Barnetta Chapel 06/07/2017, 9:28 AM

## 2017-06-08 NOTE — Telephone Encounter (Signed)
Patient contacted regarding discharge from [facility] on 06-07-17  Patient understands to follow up with provider kilroy on 06-22-17 at 8:30 am at [facility] Patient understands discharge instructions? yes Patient understands medications and regiment? yes Patient understands to bring all medications to this visit? yes

## 2017-06-10 DIAGNOSIS — I5189 Other ill-defined heart diseases: Secondary | ICD-10-CM

## 2017-06-10 DIAGNOSIS — I429 Cardiomyopathy, unspecified: Secondary | ICD-10-CM

## 2017-06-10 HISTORY — DX: Other ill-defined heart diseases: I51.89

## 2017-06-10 HISTORY — DX: Cardiomyopathy, unspecified: I42.9

## 2017-06-14 ENCOUNTER — Inpatient Hospital Stay: Payer: Self-pay

## 2017-06-22 ENCOUNTER — Encounter: Payer: Self-pay | Admitting: Cardiology

## 2017-06-22 ENCOUNTER — Ambulatory Visit (INDEPENDENT_AMBULATORY_CARE_PROVIDER_SITE_OTHER): Payer: Self-pay | Admitting: Cardiology

## 2017-06-22 VITALS — BP 180/92 | HR 92 | Ht 73.0 in | Wt 298.0 lb

## 2017-06-22 DIAGNOSIS — I420 Dilated cardiomyopathy: Secondary | ICD-10-CM

## 2017-06-22 DIAGNOSIS — I5041 Acute combined systolic (congestive) and diastolic (congestive) heart failure: Secondary | ICD-10-CM

## 2017-06-22 DIAGNOSIS — N183 Chronic kidney disease, stage 3 unspecified: Secondary | ICD-10-CM

## 2017-06-22 DIAGNOSIS — I161 Hypertensive emergency: Secondary | ICD-10-CM

## 2017-06-22 DIAGNOSIS — I160 Hypertensive urgency: Secondary | ICD-10-CM

## 2017-06-22 DIAGNOSIS — Z6838 Body mass index (BMI) 38.0-38.9, adult: Secondary | ICD-10-CM

## 2017-06-22 MED ORDER — AMLODIPINE BESYLATE 5 MG PO TABS: 5.0000 mg | ORAL_TABLET | Freq: Every day | ORAL | 1 refills | Status: DC

## 2017-06-22 MED ORDER — HYDRALAZINE HCL 50 MG PO TABS: 50.0000 mg | ORAL_TABLET | Freq: Three times a day (TID) | ORAL | 1 refills | Status: DC

## 2017-06-22 MED FILL — hydrALAZINE HCL 50 MG TABS: 50 | 30 days supply | Qty: 90 | Fill #0

## 2017-06-22 MED FILL — ?AMLODIPINE BESYLATE 5 MG T: 5 MG | 30 days supply | Qty: 30 | Fill #0

## 2017-06-22 NOTE — Progress Notes (Signed)
06/22/2017 Zachary Hawkins   1955-09-08  [id number]  Primary Physician System, Provider Not In Primary Cardiologist: Dr Royann Shivers  HPI:  62 y/o obese AA male with a history of uncontrolled HTN, seen in consult 06/05/17 for bradycardia, accelerated HTN, and acute combined CHF. His EF was 40-45$ with grade 2 DD. His weight on admission was 294 lb, at discharge 285 lbs. He is in the office today for follow up. He says he is breathing better. He is taking his medications as prescribed and has an appointment tomorrow with [facility] OP clinic.    Current Outpatient Medications  Medication Sig Dispense Refill  . aspirin EC 81 MG EC tablet Take 1 tablet (81 mg total) by mouth daily. 30 tablet 1  . carvedilol (COREG) 3.125 MG tablet Take 1 tablet (3.125 mg total) by mouth 2 (two) times daily with a meal. 60 tablet 1  . furosemide (LASIX) 40 MG tablet Take 1 tablet (40 mg total) by mouth daily. 30 tablet 0  . hydrALAZINE (APRESOLINE) 50 MG tablet Take 1 tablet (50 mg total) by mouth 3 (three) times daily. 270 tablet 1  . amLODipine (NORVASC) 5 MG tablet Take 1 tablet (5 mg total) by mouth daily. 180 tablet 1  . isosorbide dinitrate (ISORDIL) 20 MG tablet Take 1 tablet (20 mg total) by mouth 3 (three) times daily. (Patient not taking: Reported on 06/22/2017) 90 tablet 1   No current facility-administered medications for this visit.     No Known Allergies  No past medical history on file.  Social History   Socioeconomic History  . Marital status: Single    Spouse name: Not on file  . Number of children: Not on file  . Years of education: Not on file  . Highest education level: Not on file  Social Needs  . Financial resource strain: Not on file  . Food insecurity - worry: Not on file  . Food insecurity - inability: Not on file  . Transportation needs - medical: Not on file  . Transportation needs - non-medical: Not on file  Occupational History  . Not on file  Tobacco Use  . Smoking status:  Current Every Day Smoker    Packs/day: 1.00    Types: Cigarettes  . Smokeless tobacco: Never Used  Substance and Sexual Activity  . Alcohol use: Yes    Comment: socially  . Drug use: Yes    Types: Marijuana  . Sexual activity: Not on file  Other Topics Concern  . Not on file  Social History Narrative  . Not on file     Family History  Problem Relation Age of Onset  . Diabetes Mother   . Diabetes Father      Review of Systems: General: negative for chills, fever, night sweats or weight changes.  Cardiovascular: negative for chest pain, dyspnea on exertion, edema, orthopnea, palpitations, paroxysmal nocturnal dyspnea or shortness of breath Dermatological: negative for rash Respiratory: negative for cough or wheezing Urologic: negative for hematuria Abdominal: negative for nausea, vomiting, diarrhea, bright red blood per rectum, melena, or hematemesis Neurologic: negative for visual changes, syncope, or dizziness All other systems reviewed and are otherwise negative except as noted above.    Blood pressure (!) 180/92, pulse 92, height 6\' 1"  (1.854 m), weight 298 lb (135.2 kg), SpO2 94 %.  General appearance: alert, cooperative, no distress and morbidly obese Lungs: clear to auscultation bilaterally and overall decreased breath sounds Heart: regular rate and rhythm Extremities: no edema Skin:  Skin color, texture, turgor normal. No rashes or lesions Neurologic: Grossly normal   ASSESSMENT AND PLAN:   Acute combined systolic and diastolic heart failure (HCC) His wgt is up in the office but he feels better, this may be scale variation  CKD (chronic kidney disease) stage 3, GFR 30-59 ml/min (HCC) SCr at DC 1.6  Dilated cardiomyopathy (HCC) EF 40-45% Jan 2019  Obesity with serious comorbidity BMI 38  Hypertensive urgency B/P still elevated   PLAN  Options for HTN Rx limited by history of bradycardia, suspected sleep apnea, and CRI. Will increase Hydralazine to 50  mg TID and add Norvasc 5 mg. I'll see him back in 2-3 weeks. He'll need a BMP then. Consider increasing his Coreg then as well. We had a long discussion about weight loss, low salt diet, and monitoring his water intake.   Corine ShelterLuke Phuc Kluttz PA-C 06/22/2017 8:44 AM

## 2017-06-22 NOTE — Assessment & Plan Note (Signed)
SCr at DC 1.6

## 2017-06-22 NOTE — Assessment & Plan Note (Signed)
EF 40-45% Jan 2019

## 2017-06-22 NOTE — Assessment & Plan Note (Signed)
His wgt is up in the office but he feels better, this may be scale variation

## 2017-06-22 NOTE — Progress Notes (Signed)
Ty, LK MCr 

## 2017-06-22 NOTE — Assessment & Plan Note (Signed)
BP still elevated

## 2017-06-22 NOTE — Patient Instructions (Signed)
Medication Instructions: Your physician recommends that you continue on your current medications as directed. Please refer to the Current Medication list given to you today.  Increase Hydralazine to 50 mg three times daily.  START Amlodipine 5 mg daily.   Follow-Up: Your physician recommends that you schedule a follow-up appointment in: 3 weeks with Corine ShelterLuke Kilroy, PA.

## 2017-06-22 NOTE — Assessment & Plan Note (Signed)
BMI 38 

## 2017-06-23 ENCOUNTER — Ambulatory Visit: Payer: Self-pay | Attending: Family Medicine | Admitting: Family Medicine

## 2017-06-23 ENCOUNTER — Encounter: Payer: Self-pay | Admitting: Family Medicine

## 2017-06-23 VITALS — BP 178/120 | HR 104 | Temp 99.1°F | Resp 16 | Ht 73.0 in | Wt 299.0 lb

## 2017-06-23 DIAGNOSIS — N183 Chronic kidney disease, stage 3 (moderate): Secondary | ICD-10-CM | POA: Insufficient documentation

## 2017-06-23 DIAGNOSIS — I10 Essential (primary) hypertension: Secondary | ICD-10-CM | POA: Insufficient documentation

## 2017-06-23 DIAGNOSIS — Z09 Encounter for follow-up examination after completed treatment for conditions other than malignant neoplasm: Secondary | ICD-10-CM

## 2017-06-23 DIAGNOSIS — F17200 Nicotine dependence, unspecified, uncomplicated: Secondary | ICD-10-CM | POA: Insufficient documentation

## 2017-06-23 DIAGNOSIS — I5042 Chronic combined systolic (congestive) and diastolic (congestive) heart failure: Secondary | ICD-10-CM

## 2017-06-23 DIAGNOSIS — I130 Hypertensive heart and chronic kidney disease with heart failure and stage 1 through stage 4 chronic kidney disease, or unspecified chronic kidney disease: Secondary | ICD-10-CM | POA: Insufficient documentation

## 2017-06-23 DIAGNOSIS — Z7982 Long term (current) use of aspirin: Secondary | ICD-10-CM | POA: Insufficient documentation

## 2017-06-23 DIAGNOSIS — Z79899 Other long term (current) drug therapy: Secondary | ICD-10-CM | POA: Insufficient documentation

## 2017-06-23 DIAGNOSIS — Z6830 Body mass index (BMI) 30.0-30.9, adult: Secondary | ICD-10-CM | POA: Insufficient documentation

## 2017-06-23 DIAGNOSIS — E669 Obesity, unspecified: Secondary | ICD-10-CM | POA: Insufficient documentation

## 2017-06-23 MED ORDER — ISOSORBIDE DINITRATE 20 MG PO TABS: 20.0000 mg | ORAL_TABLET | Freq: Three times a day (TID) | ORAL | 2 refills | Status: DC

## 2017-06-23 MED ORDER — CARVEDILOL 3.125 MG PO TABS: 3.1250 mg | ORAL_TABLET | Freq: Two times a day (BID) | ORAL | 2 refills | Status: DC

## 2017-06-23 MED ORDER — ASPIRIN 81 MG PO TBEC: 81.0000 mg | DELAYED_RELEASE_TABLET | Freq: Every day | ORAL | 1 refills | Status: DC

## 2017-06-23 MED ORDER — FUROSEMIDE 40 MG PO TABS: 40.0000 mg | ORAL_TABLET | Freq: Every day | ORAL | 2 refills | Status: DC

## 2017-06-23 MED ORDER — BLOOD PRESSURE CUFF MISC: 1.0000 | Freq: Once | 0 refills | Status: AC

## 2017-06-23 MED ORDER — AMLODIPINE BESYLATE 10 MG PO TABS: 10.0000 mg | ORAL_TABLET | Freq: Every day | ORAL | 1 refills | Status: DC

## 2017-06-23 NOTE — Progress Notes (Signed)
Subjective:  Patient ID: Zachary Hawkins, male    DOB: 1955/07/05  Age: 62 y.o. MRN: [id number]  CC: Hospitalization Follow-up   HPI Zachary Hawkins is 62 y.o.male who presents to establish care and for hospitalization follow up. History of ED visit on 06/05/17 for c/o dyspnea upon waking. He reported history of productive cough. He does not see PCP regularly.  Upon arrival to the ED he was found to be in hypertensive emergency he was evaluated,treated, in ED and was referred to [facility]. Cardiology was consulted. CXR sowed findings consistent with emphysema. Patient is a long term smoker, he reported occasional alcohol use and marijuana. Labs revealed elevated troponin in ED. Patient was admitted for hypertensive urgency, AKI on CKD III , and dyspnea. BP during hospitalization was used amlodipine, furosemide, lisinopril, and coreg. It was recommended he follow up with labs and cardiology outpatient. Patient is obese with history of snoring no previous workup for OSA. He present today for hospitalization follow up. He reports recent cardiology visit, hydralazine was increased, and addition of amlodipine to medication regimen. BP elevated in office today he reports not picking up amlodipine prescription yet. He is not exercising and is not adherent to low salt diet.  He does not check BP at home. Cardiac symptoms: none. Patient denies any  chest pain, claudication, dyspnea, lower extremity edema, near-syncope, orthopnea and syncope. Cardiovascular risk factors: hypertension, male gender, obesity (BMI >= 30 kg/m2), sedentary lifestyle and smoking/ tobacco exposure. Use of agents associated with hypertension: none.    Outpatient Medications Prior to Visit  Medication Sig Dispense Refill  . hydrALAZINE (APRESOLINE) 50 MG tablet Take 1 tablet (50 mg total) by mouth 3 (three) times daily. 270 tablet 1  . amLODipine (NORVASC) 5 MG tablet Take 1 tablet (5 mg total) by mouth daily. 180 tablet 1  . aspirin EC  81 MG EC tablet Take 1 tablet (81 mg total) by mouth daily. 30 tablet 1  . carvedilol (COREG) 3.125 MG tablet Take 1 tablet (3.125 mg total) by mouth 2 (two) times daily with a meal. 60 tablet 1  . furosemide (LASIX) 40 MG tablet Take 1 tablet (40 mg total) by mouth daily. 30 tablet 0  . isosorbide dinitrate (ISORDIL) 20 MG tablet Take 1 tablet (20 mg total) by mouth 3 (three) times daily. (Patient not taking: Reported on 06/22/2017) 90 tablet 1   No facility-administered medications prior to visit.     ROS Review of Systems  Constitutional: Negative.   Eyes: Negative.   Respiratory: Negative.   Cardiovascular: Negative.   Gastrointestinal: Negative.   Musculoskeletal: Negative.   Skin: Negative.   Neurological: Negative.   Psychiatric/Behavioral: Negative.    Objective:  BP (!) 178/120 (BP Location: Left Arm, Patient Position: Sitting, Cuff Size: Large) Comment (Cuff Size): manual  Pulse (!) 104   Temp 99.1 F (37.3 C) (Oral)   Resp 16   Ht 6' 1"  (1.854 m)   Wt 299 lb (135.6 kg)   SpO2 93%   BMI 39.45 kg/m   BP/Weight 06/23/2017 06/22/2017 06/03/5807  Systolic BP 983 382 505  Diastolic BP 397 92 673  Wt. (Lbs) 299 298 285.3  BMI 39.45 39.32 37.64     Physical Exam  Constitutional: He is oriented to person, place, and time. He appears well-developed and well-nourished.  Eyes: Conjunctivae are normal. Pupils are equal, round, and reactive to light.  Neck: Normal range of motion. Neck supple. No JVD present.  Cardiovascular: Normal rate,  regular rhythm, normal heart sounds and intact distal pulses.  Pulmonary/Chest: Effort normal and breath sounds normal. He has no rales.  Abdominal: Soft. Bowel sounds are normal. There is no tenderness.  Neurological: He is alert and oriented to person, place, and time.  Skin: Skin is warm and dry.  Psychiatric: He has a normal mood and affect. His speech is tangential.  Nursing note and vitals reviewed.    Assessment & Plan:   1.  Hospital discharge follow-up -Will order sleep study.   2. Uncontrolled hypertension BP elevated in office today and is not controlled.  He reports plans to pick up amlodipine medication today. Concern BP will not be controlled with current dosage of amlodipine, HR normal.  Dosage of amlodpine increased. Follow up in 2 weeks. Follow up with cardiology.  Smoking cessation discussed, he declines any smoking cessation materials. - isosorbide dinitrate (ISORDIL) 20 MG tablet; Take 1 tablet (20 mg total) by mouth 3 (three) times daily.  Dispense: 90 tablet; Refill: 2 - furosemide (LASIX) 40 MG tablet; Take 1 tablet (40 mg total) by mouth daily.  Dispense: 30 tablet; Refill: 2 - carvedilol (COREG) 3.125 MG tablet; Take 1 tablet (3.125 mg total) by mouth 2 (two) times daily with a meal.  Dispense: 60 tablet; Refill: 2 - amLODipine (NORVASC) 10 MG tablet; Take 1 tablet (10 mg total) by mouth daily.  Dispense: 180 tablet; Refill: 1 - Blood Pressure Monitoring (BLOOD PRESSURE CUFF) MISC; 1 kit by Does not apply route once for 1 dose.  Dispense: 1 each; Refill: 0  3. Systolic and diastolic CHF, chronic (HCC)  - isosorbide dinitrate (ISORDIL) 20 MG tablet; Take 1 tablet (20 mg total) by mouth 3 (three) times daily.  Dispense: 90 tablet; Refill: 2 - furosemide (LASIX) 40 MG tablet; Take 1 tablet (40 mg total) by mouth daily.  Dispense: 30 tablet; Refill: 2 - carvedilol (COREG) 3.125 MG tablet; Take 1 tablet (3.125 mg total) by mouth 2 (two) times daily with a meal.  Dispense: 60 tablet; Refill: 2 - aspirin 81 MG EC tablet; Take 1 tablet (81 mg total) by mouth daily.  Dispense: 90 tablet; Refill: 1 - amLODipine (NORVASC) 10 MG tablet; Take 1 tablet (10 mg total) by mouth daily.  Dispense: 180 tablet; Refill: 1 - Blood Pressure Monitoring (BLOOD PRESSURE CUFF) MISC; 1 kit by Does not apply route once for 1 dose.  Dispense: 1 each; Refill: 0      Follow-up: Return in about 2 weeks (around 07/07/2017) for  HTN.   Alfonse Spruce FNP

## 2017-06-23 NOTE — Patient Instructions (Addendum)
Living With Heart Failure  Heart failure is a long-term (chronic) condition in which the heart cannot pump enough blood through the body. When this happens, parts of the body do not get the blood and oxygen they need. There is no cure for heart failure at this time, so it is important for you to take good care of yourself and follow the treatment plan set by your health care provider. If you are living with heart failure, there are ways to help you manage the disease. Follow these instructions at home: Living with heart failure requires you to make changes in your life. Your health care team will teach you about the changes you need to make in order to relieve your symptoms and lower your risk of going to the hospital. Follow the treatment plan as set by your health care provider. Medicines Medicines are important in reducing your heart's workload, slowing the progression of heart failure, and improving your symptoms.  Take over-the-counter and prescription medicines only as told by your health care provider.  Do not stop taking your medicine unless your health care provider tells you to do that.  Do not skip any dose of your medicine.  Refill prescriptions before you run out of medicine. You need your medicines every day.  Eating and drinking  Eat heart-healthy foods. Talk with a dietitian to make an eating plan that is right for you. ? If directed by your health care provider: ? Limit salt (sodium). Lowering your sodium intake may reduce symptoms of heart failure. Ask a dietitian to recommend heart-healthy seasonings. ? Limit your fluid intake. Fluid restriction may reduce symptoms of heart failure. ? Use low-fat cooking methods instead of frying. Low-fat methods include roasting, grilling, broiling, baking, poaching, steaming, and stir-frying. ? Choose foods that contain no trans fat and are low in saturated fat and cholesterol. Healthy choices include fresh or frozen fruits and vegetables,  fish, lean meats, legumes, fat-free or low-fat dairy products, and whole-grain or high-fiber foods.  Limit alcohol intake to no more than 1 drink a day for nonpregnant women and 2 drinks a day for men. One drink equals 12 oz of beer, 5 oz of wine, or 1 oz of hard liquor. ? Drinking more than that is harmful to your heart. Tell your health care provider if you drink alcohol several times a week. ? Talk with your health care provider about whether any level of alcohol use is safe for you. Activity  Ask your health care provider about attending cardiac rehabilitation. These programs include aerobic physical activity, which provides many benefits for your heart.  If no cardiac rehabilitation program is available, ask your health care provider what aerobic exercises are safe for you to do. Lifestyle Make the lifestyle changes recommended by your health care provider. In general:  Lose weight if your health care provider tells you to do that. Weight loss may reduce symptoms of heart failure.  Do not use any products that contain nicotine or tobacco, such as cigarettes or e-cigarettes. If you need help quitting, ask your health care provider.  Do not use street (illegal) drugs.  Return to your normal activities as told by your health care provider. Ask your health care provider what activities are safe for you.  General instructions  Make sure you weigh yourself every day to track your weight. Rapid weight gain may indicate an increase in fluid in your body and may increase the workload of your heart. ? Weigh yourself every morning. Do   this after you urinate but before you eat breakfast. ? Wear the same type of clothing, without shoes, each time you weigh yourself. ? Weigh yourself on the same scale and in the same spot each time.  Living with chronic heart failure often leads to emotions such as fear, stress, anxiety, and depression. If you feel any of these emotions and need help coping,  contact your health care provider. Other ways to get help include: ? Talking to friends and family members about your condition. They can give you support and guidance. Explain your symptoms to them and, if comfortable, invite them to attend appointments or rehabilitation with you. ? Joining a support group for people with chronic heart failure. Talking with other people who have the same symptoms may give you new ways of coping with your disease and your emotions.  Stay up to date with your shots (vaccines). Staying current on pneumococcal and influenza vaccines is especially important in preventing germs from attacking your airways (respiratory infections).  Keep all follow-up visits as told by your health care provider. This is important. How to recognize changes in your condition You and your family members need to know what changes to watch for in your condition. Watch for the following changes and report them to your health care provider:  Sudden weight gain. Ask your health care provider what amount of weight gain to report.  Shortness of breath: ? Feeling short of breath while at rest, with no exercise or activity that required great effort. ? Feeling breathless with activity.  Swelling of your lower legs or ankles.  Difficulty sleeping: ? You wake up feeling short of breath. ? You have to use more pillows to raise your head in order to sleep.  Frequent, dry, hacking cough.  Loss of appetite.  Feeling more tired all the time.  Depression or feelings of sadness or hopelessness.  Bloating in the stomach.  Where to find more information  Local support groups. Ask your health care provider about groups near you.  The American Heart Association: www.heart.org Contact a health care provider if:  You have a rapid weight gain.  You have increasing shortness of breath that is unusual for you.  You are unable to participate in your usual physical activities.  You tire  easily.  You cough more than normal, especially with physical activity.  You have any swelling or more swelling in areas such as your hands, feet, ankles, or abdomen.  You feel like your heart is beating quickly (palpitations).  You become dizzy or light-headed when you stand up. Get help right away if:  You have difficulty breathing.  You notice or your family notices a change in your awareness, such as having trouble staying awake or having difficulty with concentration.  You have pain or discomfort in your chest.  You have an episode of fainting (syncope). Summary  There is no cure for heart failure, so it is important for you to take good care of yourself and follow the treatment plan set by your health care provider.  Medicines are important in reducing your heart's workload, slowing the progression of heart failure, and improving your symptoms.  Living with chronic heart failure often leads to emotions such as fear, stress, anxiety, and depression. If you are feeling any of these emotions and need help coping, contact your health care provider. This information is not intended to replace advice given to you by your health care provider. Make sure you discuss any questions you   have with your health care provider. Document Released: 09/08/2016 Document Revised: 09/08/2016 Document Reviewed: 09/08/2016 Elsevier Interactive Patient Education  2018 [employer]   Managing Your Hypertension Hypertension is commonly called high blood pressure. This is when the force of your blood pressing against the walls of your arteries is too strong. Arteries are blood vessels that carry blood from your heart throughout your body. Hypertension forces the heart to work harder to pump blood, and may cause the arteries to become narrow or stiff. Having untreated or uncontrolled hypertension can cause heart attack, stroke, kidney disease, and other problems. What are blood pressure readings? A blood  pressure reading consists of a higher number over a lower number. Ideally, your blood pressure should be below 120/80. The first ("top") number is called the systolic pressure. It is a measure of the pressure in your arteries as your heart beats. The second ("bottom") number is called the diastolic pressure. It is a measure of the pressure in your arteries as the heart relaxes. What does my blood pressure reading mean? Blood pressure is classified into four stages. Based on your blood pressure reading, your health care provider may use the following stages to determine what type of treatment you need, if any. Systolic pressure and diastolic pressure are measured in a unit called mm Hg. Normal  Systolic pressure: below 120.  Diastolic pressure: below 80. Elevated  Systolic pressure: 120-129.  Diastolic pressure: below 80. Hypertension stage 1  Systolic pressure: 130-139.  Diastolic pressure: 80-89. Hypertension stage 2  Systolic pressure: 140 or above.  Diastolic pressure: 90 or above. What health risks are associated with hypertension? Managing your hypertension is an important responsibility. Uncontrolled hypertension can lead to:  A heart attack.  A stroke.  A weakened blood vessel (aneurysm).  Heart failure.  Kidney damage.  Eye damage.  Metabolic syndrome.  Memory and concentration problems.  What changes can I make to manage my hypertension? Hypertension can be managed by making lifestyle changes and possibly by taking medicines. Your health care provider will help you make a plan to bring your blood pressure within a normal range. Eating and drinking  Eat a diet that is high in fiber and potassium, and low in salt (sodium), added sugar, and fat. An example eating plan is called the DASH (Dietary Approaches to Stop Hypertension) diet. To eat this way: ? Eat plenty of fresh fruits and vegetables. Try to fill half of your plate at each meal with fruits and  vegetables. ? Eat whole grains, such as whole wheat pasta, brown rice, or whole grain bread. Fill about one quarter of your plate with whole grains. ? Eat low-fat diary products. ? Avoid fatty cuts of meat, processed or cured meats, and poultry with skin. Fill about one quarter of your plate with lean proteins such as fish, chicken without skin, beans, eggs, and tofu. ? Avoid premade and processed foods. These tend to be higher in sodium, added sugar, and fat.  Reduce your daily sodium intake. Most people with hypertension should eat less than 1,500 mg of sodium a day.  Limit alcohol intake to no more than 1 drink a day for nonpregnant women and 2 drinks a day for men. One drink equals 12 oz of beer, 5 oz of wine, or 1 oz of hard liquor. Lifestyle  Work with your health care provider to maintain a healthy body weight, or to lose weight. Ask what an ideal weight is for you.  Get at least 30 minutes  of exercise that causes your heart to beat faster (aerobic exercise) most days of the week. Activities may include walking, swimming, or biking.  Include exercise to strengthen your muscles (resistance exercise), such as weight lifting, as part of your weekly exercise routine. Try to do these types of exercises for 30 minutes at least 3 days a week.  Do not use any products that contain nicotine or tobacco, such as cigarettes and e-cigarettes. If you need help quitting, ask your health care provider.  Control any long-term (chronic) conditions you have, such as high cholesterol or diabetes. Monitoring  Monitor your blood pressure at home as told by your health care provider. Your personal target blood pressure may vary depending on your medical conditions, your age, and other factors.  Have your blood pressure checked regularly, as often as told by your health care provider. Working with your health care provider  Review all the medicines you take with your health care provider because there may  be side effects or interactions.  Talk with your health care provider about your diet, exercise habits, and other lifestyle factors that may be contributing to hypertension.  Visit your health care provider regularly. Your health care provider can help you create and adjust your plan for managing hypertension. Will I need medicine to control my blood pressure? Your health care provider may prescribe medicine if lifestyle changes are not enough to get your blood pressure under control, and if:  Your systolic blood pressure is 130 or higher.  Your diastolic blood pressure is 80 or higher.  Take medicines only as told by your health care provider. Follow the directions carefully. Blood pressure medicines must be taken as prescribed. The medicine does not work as well when you skip doses. Skipping doses also puts you at risk for problems. Contact a health care provider if:  You think you are having a reaction to medicines you have taken.  You have repeated (recurrent) headaches.  You feel dizzy.  You have swelling in your ankles.  You have trouble with your vision. Get help right away if:  You develop a severe headache or confusion.  You have unusual weakness or numbness, or you feel faint.  You have severe pain in your chest or abdomen.  You vomit repeatedly.  You have trouble breathing. Summary  Hypertension is when the force of blood pumping through your arteries is too strong. If this condition is not controlled, it may put you at risk for serious complications.  Your personal target blood pressure may vary depending on your medical conditions, your age, and other factors. For most people, a normal blood pressure is less than 120/80.  Hypertension is managed by lifestyle changes, medicines, or both. Lifestyle changes include weight loss, eating a healthy, low-sodium diet, exercising more, and limiting alcohol. This information is not intended to replace advice given to you  by your health care provider. Make sure you discuss any questions you have with your health care provider. Document Released: 01/19/2012 Document Revised: 03/24/2016 Document Reviewed: 03/24/2016 Elsevier Interactive Patient Education  [employer].

## 2017-06-23 NOTE — Progress Notes (Signed)
Patient is here for a hospital follow-up. Patient stated the side of his heel huts and may due to his shoes.

## 2017-06-30 ENCOUNTER — Other Ambulatory Visit: Payer: Self-pay | Admitting: Family Medicine

## 2017-06-30 DIAGNOSIS — Z09 Encounter for follow-up examination after completed treatment for conditions other than malignant neoplasm: Secondary | ICD-10-CM

## 2017-06-30 DIAGNOSIS — R0683 Snoring: Secondary | ICD-10-CM

## 2017-07-07 ENCOUNTER — Encounter: Payer: Self-pay | Admitting: Pharmacist

## 2017-07-07 NOTE — Progress Notes (Deleted)
   S:    Patient arrives ***.    Presents to the clinic for hypertension evaluation.  Patient {Actions; denies-reports:120008} adherence with medications.  Current BP Medications include:  Amlodipine 10 mg daily, carvedilol 3.125 mg BID, hydralazine 50 mg TID, isosorbide dinitrate TID, furosemide 40 mg daily   Antihypertensives tried in the past include: ***  Dietary habits include:  .[email or web address]   O:   Last 3 Office BP readings: BP Readings from Last 3 Encounters:  06/23/17 (!) 178/120  06/22/17 (!) 180/92  06/07/17 (!) 127/100    BMET    Component Value Date/Time   NA 138 06/07/2017 0554   K 4.3 06/07/2017 0554   CL 102 06/07/2017 0554   CO2 27 06/07/2017 0554   GLUCOSE 118 (H) 06/07/2017 0554   BUN 16 06/07/2017 0554   CREATININE 1.52 (H) 06/07/2017 0554   CALCIUM 8.9 06/07/2017 0554   GFRNONAA 48 (L) 06/07/2017 0554   GFRAA 56 (L) 06/07/2017 0554    A/P: Hypertension longstanding currently *** on current medications.  {Meds adjust:18428} ***.   Results reviewed and written information provided.   Total time in face-to-face counseling *** minutes.   F/U Clinic Visit with Dr. Marland Kitchen***.  Patient seen with Enid DerryVictoria Mitchell, PharmD Candidate

## 2017-07-13 ENCOUNTER — Ambulatory Visit (HOSPITAL_BASED_OUTPATIENT_CLINIC_OR_DEPARTMENT_OTHER): Payer: Self-pay | Attending: Family Medicine | Admitting: Internal Medicine

## 2017-07-13 ENCOUNTER — Encounter: Payer: Self-pay | Admitting: Cardiology

## 2017-07-13 ENCOUNTER — Ambulatory Visit (INDEPENDENT_AMBULATORY_CARE_PROVIDER_SITE_OTHER): Payer: Self-pay | Admitting: Cardiology

## 2017-07-13 VITALS — BP 192/94 | HR 93 | Ht 73.0 in | Wt 297.0 lb

## 2017-07-13 VITALS — Ht 72.0 in | Wt 297.0 lb

## 2017-07-13 DIAGNOSIS — G4736 Sleep related hypoventilation in conditions classified elsewhere: Secondary | ICD-10-CM | POA: Insufficient documentation

## 2017-07-13 DIAGNOSIS — Z6838 Body mass index (BMI) 38.0-38.9, adult: Secondary | ICD-10-CM

## 2017-07-13 DIAGNOSIS — N183 Chronic kidney disease, stage 3 unspecified: Secondary | ICD-10-CM

## 2017-07-13 DIAGNOSIS — Z09 Encounter for follow-up examination after completed treatment for conditions other than malignant neoplasm: Secondary | ICD-10-CM

## 2017-07-13 DIAGNOSIS — I420 Dilated cardiomyopathy: Secondary | ICD-10-CM

## 2017-07-13 DIAGNOSIS — G4761 Periodic limb movement disorder: Secondary | ICD-10-CM | POA: Insufficient documentation

## 2017-07-13 DIAGNOSIS — Z91148 Patient's other noncompliance with medication regimen for other reason: Secondary | ICD-10-CM

## 2017-07-13 DIAGNOSIS — I10 Essential (primary) hypertension: Secondary | ICD-10-CM

## 2017-07-13 DIAGNOSIS — G4733 Obstructive sleep apnea (adult) (pediatric): Secondary | ICD-10-CM | POA: Insufficient documentation

## 2017-07-13 DIAGNOSIS — E66812 Obesity, class 2: Secondary | ICD-10-CM

## 2017-07-13 DIAGNOSIS — I499 Cardiac arrhythmia, unspecified: Secondary | ICD-10-CM | POA: Insufficient documentation

## 2017-07-13 DIAGNOSIS — Z9114 Patient's other noncompliance with medication regimen: Secondary | ICD-10-CM

## 2017-07-13 DIAGNOSIS — R0683 Snoring: Secondary | ICD-10-CM | POA: Insufficient documentation

## 2017-07-13 DIAGNOSIS — I5042 Chronic combined systolic (congestive) and diastolic (congestive) heart failure: Secondary | ICD-10-CM

## 2017-07-13 MED ORDER — CARVEDILOL 3.125 MG PO TABS: 3.1250 mg | ORAL_TABLET | Freq: Two times a day (BID) | ORAL | 6 refills | Status: DC

## 2017-07-13 MED ORDER — ISOSORBIDE DINITRATE 20 MG PO TABS: 20.0000 mg | ORAL_TABLET | Freq: Three times a day (TID) | ORAL | 6 refills | Status: DC

## 2017-07-13 MED ORDER — HYDRALAZINE HCL 50 MG PO TABS
50.0000 mg | ORAL_TABLET | Freq: Three times a day (TID) | ORAL | 6 refills | Status: DC
Start: 2017-07-13 — End: 2018-10-06

## 2017-07-13 MED ORDER — AMLODIPINE BESYLATE 10 MG PO TABS: 10.0000 mg | ORAL_TABLET | Freq: Every day | ORAL | 3 refills | Status: DC

## 2017-07-13 MED FILL — CARVEDILOL 3.125 MG TABLET: 3.125 | 30 days supply | Qty: 60 | Fill #0

## 2017-07-13 MED FILL — AMLODIPINE BESYLATE 10 MG T: 10 | 30 days supply | Qty: 30 | Fill #0

## 2017-07-13 MED FILL — hydrALAZINE HCL 50 MG TABS: 50 | 30 days supply | Qty: 90 | Fill #0

## 2017-07-13 MED FILL — ISOSORBIDE DN 20 MG TABLET: 20 | 30 days supply | Qty: 90 | Fill #0

## 2017-07-13 NOTE — Progress Notes (Signed)
07/13/2017 Zachary Hawkins   Nov 08, 1955  [id number]  Primary Physician System, Provider Not In Primary Cardiologist:Dr Croitoru  HPI:  62 y/o obese AA male with a history of uncontrolled HTN, seen in consult 60/27/19 for bradycardia, accelerated HTN, renal insufficiency, and acute combined CHF. His EF was 40-45% with grade 2 DD. His weight on admission was 294 lb, at discharge 285 lbs. He was seen in the office 06/22/17 and Hydralazine was increased. He is back today for follow up. He unfortunately has been out of some of his medications for the past few days- no Lasix or Coreg. His weight is up to 297 lbs, his B/P 190-110. He admits he is having trouble coming to grips with the fact that he needs to take medications. He denies feeling bad-no unusual SOB today. [facility] but missed his follow up. He is still smoking but has cut back to less than 1/2 PPD.    Current Outpatient Medications  Medication Sig Dispense Refill  . amLODipine (NORVASC) 5 MG tablet Take 5 mg by mouth daily.    Marland Kitchen. aspirin 81 MG EC tablet Take 1 tablet (81 mg total) by mouth daily. 90 tablet 1  . carvedilol (COREG) 3.125 MG tablet Take 1 tablet (3.125 mg total) by mouth 2 (two) times daily with a meal. 60 tablet 2  . furosemide (LASIX) 40 MG tablet Take 1 tablet (40 mg total) by mouth daily. 30 tablet 2  . hydrALAZINE (APRESOLINE) 50 MG tablet Take 1 tablet (50 mg total) by mouth 3 (three) times daily. 270 tablet 1  . isosorbide dinitrate (ISORDIL) 20 MG tablet Take 1 tablet (20 mg total) by mouth 3 (three) times daily. 90 tablet 2   No current facility-administered medications for this visit.     No Known Allergies  Past Medical History:  Diagnosis Date  . Cardiomyopathy (HCC) 06/2017   EF 40-45%  . Chronic renal insufficiency, stage 3 (moderate) (HCC)   . Diastolic dysfunction 06/2017   grade 2   . Hypertension   . Obesity     Social History    Socioeconomic History  . Marital status: Single    Spouse name: Not on file  . Number of children: Not on file  . Years of education: Not on file  . Highest education level: Not on file  Social Needs  . Financial resource strain: Not on file  . Food insecurity - worry: Not on file  . Food insecurity - inability: Not on file  . Transportation needs - medical: Not on file  . Transportation needs - non-medical: Not on file  Occupational History  . Not on file  Tobacco Use  . Smoking status: Current Every Day Smoker    Packs/day: 1.00    Types: Cigarettes  . Smokeless tobacco: Never Used  Substance and Sexual Activity  . Alcohol use: Yes    Comment: socially  . Drug use: Yes    Types: Marijuana  . Sexual activity: Not on file  Other Topics Concern  . Not on file  Social History Narrative  . Not on file     Family History  Problem Relation Age of Onset  . Diabetes Mother   . Diabetes Father      Review of Systems: General: negative for chills, fever, night sweats or weight changes.  Cardiovascular: negative for chest pain, dyspnea on exertion, edema, orthopnea, palpitations, paroxysmal nocturnal dyspnea or shortness of breath  Dermatological: negative for rash Respiratory: negative for cough or wheezing Urologic: negative for hematuria Abdominal: negative for nausea, vomiting, diarrhea, bright red blood per rectum, melena, or hematemesis Neurologic: negative for visual changes, syncope, or dizziness All other systems reviewed and are otherwise negative except as noted above.    Blood pressure (!) 192/94, pulse 93, height 6\' 1"  (1.854 m), weight 297 lb (134.7 kg).  General appearance: alert, cooperative, no distress, morbidly obese and poor dentition Neck: no carotid bruit and no JVD Lungs: clear to auscultation bilaterally Heart: regular rate and rhythm and 2/6 systolic murmur Extremities: no edema Skin: Skin color, texture, turgor normal. No rashes or  lesions Neurologic: Grossly normal   ASSESSMENT AND PLAN:   Chronic combined systolic and diastolic heart failure (HCC) His wgt is up in the office-297 lbs but he denies symptoms  CKD (chronic kidney disease) stage 3, GFR 30-59 ml/min (HCC) SCr at DC 1.6  Dilated cardiomyopathy (HCC) EF 40-45% Jan 2019  Obesity with serious comorbidity BMI 38- suspected sleep apnea  Hypertensive urgency B/P still elevated  Non compliance with medications Most of this is financial   PLAN  Long talk with Mr Klemz today. We discussed diet- he has been eating pork skins and potato chips. I also told him he would most likely be on medication for the rest of his life and he is starting to come to terms with that. I told him to increase his Norvasc to 10 mg and get his Lasix Rx filled-it should only be a few dollars. He'll need to come back for a B/P check with a nurse next week and see me in 4 weeks. He will contact [facility] for a follow up as well.   Corine Shelter PA-C 07/13/2017 8:14 AM

## 2017-07-13 NOTE — Addendum Note (Signed)
Addended by: Kandice RobinsonsYOUNG, Haroun Cotham T on: 07/13/2017 09:32 AM   Modules accepted: Orders

## 2017-07-13 NOTE — Patient Instructions (Addendum)
Medication Instructions:  INCREASE Norvasc(Amlodopine) to 10mg  Take 1 tablet once a day  Labwork: None   Testing/Procedures: none  Follow-Up: Your physician recommends that you schedule a follow-up appointment in: 1 month with Corine ShelterLuke Kilroy, PA Your physician recommends that you schedule a follow-up appointment in: 1 wk Nurse Visit  Any Other Special Instructions Will Be Listed Below (If Applicable). [facility] phone number [phone] If you need a refill on your cardiac medications before your next appointment, please call your pharmacy.

## 2017-07-14 ENCOUNTER — Ambulatory Visit: Payer: Self-pay | Attending: Internal Medicine | Admitting: Pharmacist

## 2017-07-14 ENCOUNTER — Encounter: Payer: Self-pay | Admitting: Pharmacist

## 2017-07-14 VITALS — BP 179/117 | HR 107

## 2017-07-14 DIAGNOSIS — I10 Essential (primary) hypertension: Secondary | ICD-10-CM | POA: Insufficient documentation

## 2017-07-14 MED FILL — FUROSEMIDE 40 MG TAB: 40 | 30 days supply | Qty: 30 | Fill #0

## 2017-07-14 NOTE — Progress Notes (Addendum)
   S:    Patient arrives in good spirits.  Presents to the clinic for hypertension evaluation.   Patient denies adherence with medications. He still has not picked them up. The pharmacy is working them now. He understands the need to take his medications after talking with his cardiologist.  Current BP Medications include:  Amlodipine 10 mg daily, carvedilol 3.125 mg BID, hydralazine 50 mg TID, isosorbide dinitrate 20 mg TID, furosemide 40 mg daily   Denies SOB, chest pain, headache, N/V/D, vision changes, dizziness. O:   Last 3 Office BP readings: BP Readings from Last 3 Encounters:  07/14/17 (!) 179/117  07/13/17 (!) 192/94  06/23/17 (!) 178/120    BMET    Component Value Date/Time   NA 138 06/07/2017 0554   K 4.3 06/07/2017 0554   CL 102 06/07/2017 0554   CO2 27 06/07/2017 0554   GLUCOSE 118 (H) 06/07/2017 0554   BUN 16 06/07/2017 0554   CREATININE 1.52 (H) 06/07/2017 0554   CALCIUM 8.9 06/07/2017 0554   GFRNONAA 48 (L) 06/07/2017 0554   GFRAA 56 (L) 06/07/2017 0554    A/P: Hypertension longstanding currently uncontrolled on current medications due to nonadherence.  Continued current medications as prescribed, patient to follow up with cardiology next week as scheduled for HTN management. Patient declined clonidine in office due to needing to leave for a meeting but was going to pick up his medications and take them as soon as he had them. Noted that patient did pick up medications on his way out.  Results reviewed and written information provided.   Total time in face-to-face counseling 15 minutes.   F/U Clinic Visit with cardiology next week.  Patient seen with Emmaline LifeYang Guo, PharmD Candidate

## 2017-07-15 DIAGNOSIS — Z09 Encounter for follow-up examination after completed treatment for conditions other than malignant neoplasm: Secondary | ICD-10-CM

## 2017-07-15 NOTE — Procedures (Signed)
Patient Name: Zachary Hawkins, Zachary Hawkins Study Date: 07/13/2017 Gender: Male D.O.B: 12/19/1955 Age (years): [age] Referring Provider: Lizbeth BarkMandesia R [name] FNP Height (inches): 72 Interpreting Physician: Jetty Duhamellinton Saide Lanuza MD, ABSM Weight (lbs): 297 RPSGT: Shelah LewandowskyGregory, Kenyon BMI: 40 MRN: [id number] Neck Size: 18.50 <br> <br> CLINICAL INFORMATION Sleep Study Type: NPSG Indication for sleep study: Congestive Heart Failure, Morbid Obesity, Obesity, Snoring, Uncontrolled Hypertension, Witnesses Apnea / Gasping During Sleep  Epworth Sleepiness Score: 1  SLEEP STUDY TECHNIQUE As per the AASM Manual for the Scoring of Sleep and Associated Events v2.3 (April 2016) with a hypopnea requiring 4% desaturations.  The channels recorded and monitored were frontal, central and occipital EEG, electrooculogram (EOG), submentalis EMG (chin), nasal and oral airflow, thoracic and abdominal wall motion, anterior tibialis EMG, snore microphone, electrocardiogram, and pulse oximetry.  MEDICATIONS Medications self-administered by patient taken the night of the study : none reported  SLEEP ARCHITECTURE The study was initiated at 10:09:54 PM and ended at 5:07:53 AM.  Sleep onset time was 8.3 minutes and the sleep efficiency was 76.8%%. The total sleep time was 321.0 minutes.  Stage REM latency was 76.5 minutes.  The patient spent 14.0%% of the night in stage N1 sleep, 63.9%% in stage N2 sleep, 0.0%% in stage N3 and 22.12% in REM.  Alpha intrusion was absent.  Supine sleep was 16.20%.  RESPIRATORY PARAMETERS The overall apnea/hypopnea index (AHI) was 23.9 per hour. There were 78 total apneas, including 76 obstructive, 2 central and 0 mixed apneas. There were 50 hypopneas and 60 RERAs.  The AHI during Stage REM sleep was 36.3 per hour.  AHI while supine was 63.5 per hour.  The mean oxygen saturation was 93.1%. The minimum SpO2 during sleep was 0.0%.  moderate snoring was noted during this study.  CARDIAC DATA The  2 lead EKG demonstrated sinus rhythm. The mean heart rate was 79.7 beats per minute. Other EKG findings include: Long sinus pauses up to 14 seconds. Occasional PVC.  LEG MOVEMENT DATA The total PLMS were 0 with a resulting PLMS index of 0.0. Associated arousal with leg movement index was 9.2 .  IMPRESSIONS - Moderate obstructive sleep apnea occurred during this study (AHI = 23.9/h). - No significant central sleep apnea occurred during this study (CAI = 0.4/h). - Oxygen desaturation was noted during this study (Min O2 = 86.0%). Mean 93.1%. Time less than/ equal 88%   11 minutes - The patient snored with moderate snoring volume. - EKG findings include PVCs. There were repeated long sinus pauses lasting up to 14 seconds.  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 - Consider cardiology/ heart monitor. - Positional therapy avoiding supine position during sleep. - Be careful with alcohol, sedatives and other CNS depressants that may worsen sleep apnea and disrupt normal sleep architecture. - Sleep hygiene should be reviewed to assess factors that may improve sleep quality. - Weight management and regular exercise should be initiated or continued if appropriate.  [Electronically signed] 07/15/2017 01:51 PM  Jetty Duhamellinton Jadea Shiffer MD, ABSM Diplomate, American Board of Sleep Medicine   NPI: [id number]                          Jetty Duhamellinton Malique Driskill Diplomate, American Board of Sleep Medicine  ELECTRONICALLY SIGNED ON:  07/15/2017, 1:43 PM [facility] PH: [phone]  FX: [phone] [place]

## 2017-07-22 ENCOUNTER — Ambulatory Visit: Payer: Self-pay

## 2017-07-22 DIAGNOSIS — Z79899 Other long term (current) drug therapy: Secondary | ICD-10-CM

## 2017-07-22 DIAGNOSIS — I10 Essential (primary) hypertension: Secondary | ICD-10-CM

## 2017-07-22 MED ORDER — LISINOPRIL 20 MG PO TABS: 20.0000 mg | ORAL_TABLET | Freq: Every day | ORAL | 3 refills | Status: DC

## 2017-07-22 MED FILL — LISINOPRIL 20 MG TAB: 20 | 30 days supply | Qty: 30 | Fill #0

## 2017-07-22 NOTE — Progress Notes (Signed)
Pt in today for a BP check.  Today's BP in left arm 150/102 and right arm 150/100; HR 74 Oxygen 97 on Room air.  Pt weight 298.   Pt stated he has been taking his medication as instructed. He does not have a BP monitor at home but he hopes to get one in the next few weeks. His diet he does a lot of fresh/frozen veggies, fish, and chicken not using any salt. He does drink a lot of juice throughout the day and some water. Pt stated the he drinks at least  4 or 5 Ocean [employer] juices daily along with is 2-4 bottles of water. Pt educated on importance of decreasing fluid intake as he is on medication to get fluid down to manage his BP. Pt stated he really likes his pinapple juice but will try. Set a goal to only 3 juices with meals the rest should be water.   Reviewed pt BP with PharmD, ordered for pt to start Lisinopril 20mg  Qdaily, BMET in one week, and [facility]  Reviewed plan with pt, he agrees. [facility], called to verify prices of 30 supply for pt, told him it will be $4/ for one month and $10 for 90 supply. Verbalized understanding, no additional questions at this time.

## 2017-07-22 NOTE — Patient Instructions (Addendum)
Medication Instructions:  Your physician has recommended you make the following change in your medication:  1) START Lisinopril 20mg  by mouth ONCE daily   Labwork: Your physician recommends that you return for lab work in: ONE Week (BMET) See attache slips   Testing/Procedures: none  Follow-Up: Keep scheduled follow up appointment with L. Kilroy, Pa on 4/8 @ 8am.   Any Other Special Instructions Will Be Listed Below (If Applicable).     If you need a refill on your cardiac medications before your next appointment, please call your pharmacy.

## 2017-08-15 ENCOUNTER — Encounter: Payer: Self-pay | Admitting: Cardiology

## 2017-08-15 ENCOUNTER — Other Ambulatory Visit: Payer: Self-pay | Admitting: Cardiology

## 2017-08-15 ENCOUNTER — Ambulatory Visit (INDEPENDENT_AMBULATORY_CARE_PROVIDER_SITE_OTHER): Payer: Self-pay | Admitting: Cardiology

## 2017-08-15 VITALS — BP 122/92 | HR 83 | Ht 72.0 in | Wt 298.0 lb

## 2017-08-15 DIAGNOSIS — Z9114 Patient's other noncompliance with medication regimen: Secondary | ICD-10-CM

## 2017-08-15 DIAGNOSIS — N183 Chronic kidney disease, stage 3 unspecified: Secondary | ICD-10-CM

## 2017-08-15 DIAGNOSIS — I10 Essential (primary) hypertension: Secondary | ICD-10-CM

## 2017-08-15 DIAGNOSIS — I420 Dilated cardiomyopathy: Secondary | ICD-10-CM

## 2017-08-15 DIAGNOSIS — I5042 Chronic combined systolic (congestive) and diastolic (congestive) heart failure: Secondary | ICD-10-CM

## 2017-08-15 LAB — BASIC METABOLIC PANEL
BUN/Creatinine Ratio: 16 (ref 9–20)
BUN: 32 mg/dL — ABNORMAL HIGH (ref 6–24)
CO2: 23 mmol/L (ref 20–29)
Calcium: 9 mg/dL (ref 8.7–10.2)
Chloride: 103 mmol/L (ref 96–106)
Creatinine, Ser: 1.94 mg/dL — ABNORMAL HIGH (ref 0.76–1.27)
GFR calc Af Amer: 43 mL/min/{1.73_m2} — ABNORMAL LOW (ref >59)
GFR calc non Af Amer: 37 mL/min/{1.73_m2} — ABNORMAL LOW (ref >59)
Glucose: 109 mg/dL — ABNORMAL HIGH (ref 65–99)
Potassium: 4.8 mmol/L (ref 3.5–5.2)
Sodium: 138 mmol/L (ref 134–144)

## 2017-08-15 MED ORDER — FUROSEMIDE 40 MG PO TABS: 40.0000 mg | ORAL_TABLET | Freq: Every day | ORAL | 6 refills | Status: DC

## 2017-08-15 MED FILL — FUROSEMIDE 40 MG TAB: 40 | 30 days supply | Qty: 30 | Fill #0

## 2017-08-15 NOTE — Patient Instructions (Addendum)
Medication Instructions: No Medication Changes  Labwork: Your physician recommends that you have labs today: BMP     Follow-Up: Your physician recommends that you schedule a follow-up appointment in: 3 months with [facility] Kilroy, [facility]        If you need a refill on your cardiac medications before your next appointment, please call your pharmacy.

## 2017-08-15 NOTE — Assessment & Plan Note (Signed)
Repeat B/P by me 122/88- resume coreg

## 2017-08-15 NOTE — Assessment & Plan Note (Signed)
EF 40-45% Jan 2019

## 2017-08-15 NOTE — Assessment & Plan Note (Signed)
His weight remains higher than when he was at [facility] in Jan, but he is not taking all his medications. Symptomatically he appears stable. We again discussed the importance of a low sodium diet.

## 2017-08-15 NOTE — Progress Notes (Signed)
08/15/2017 Zachary Hawkins   November 04, 1955  [id number]  Primary Physician [name], FNP Primary Cardiologist: Dr Royann Shivers  HPI:  62 y/o obese AA male with a history of uncontrolled HTN, seen in consult 06/05/17 for bradycardia, accelerated HTN, renal insufficiency, and acute combined CHF. His EF was 40-45% with grade 2 DD. His weight on admission was 294 lb, at discharge 285 lbs. He has been followed as an OP since. He ran out of some of his medications again since his LOV. He did get his sleep study done and the results are pending. Overall he seems to be doing OK. He denies LE edema. He denies increased DOE or orthopnea. He is still smoking, < 1/2 pack a day.    Current Outpatient Medications  Medication Sig Dispense Refill  . amLODipine (NORVASC) 10 MG tablet Take 1 tablet (10 mg total) by mouth daily. 30 tablet 3  . aspirin 81 MG EC tablet Take 1 tablet (81 mg total) by mouth daily. 90 tablet 1  . carvedilol (COREG) 3.125 MG tablet Take 1 tablet (3.125 mg total) by mouth 2 (two) times daily with a meal. 60 tablet 6  . furosemide (LASIX) 40 MG tablet Take 1 tablet (40 mg total) by mouth daily. 30 tablet 2  . hydrALAZINE (APRESOLINE) 50 MG tablet Take 1 tablet (50 mg total) by mouth 3 (three) times daily. 90 tablet 6  . isosorbide dinitrate (ISORDIL) 20 MG tablet Take 1 tablet (20 mg total) by mouth 3 (three) times daily. 30 tablet 6  . lisinopril (PRINIVIL,ZESTRIL) 20 MG tablet Take 1 tablet (20 mg total) by mouth daily. 30 tablet 3   No current facility-administered medications for this visit.     No Known Allergies  Past Medical History:  Diagnosis Date  . Cardiomyopathy (HCC) 06/2017   EF 40-45%  . Chronic renal insufficiency, stage 3 (moderate) (HCC)   . Diastolic dysfunction 06/2017   grade 2   . Hypertension   . Obesity     Social History   Socioeconomic History  . Marital status: Single    Spouse name: Not on file  . Number of children: Not on file  .  Years of education: Not on file  . Highest education level: Not on file  Occupational History  . Not on file  Social Needs  . Financial resource strain: Not on file  . Food insecurity:    Worry: Not on file    Inability: Not on file  . Transportation needs:    Medical: Not on file    Non-medical: Not on file  Tobacco Use  . Smoking status: Current Every Day Smoker    Packs/day: 1.00    Types: Cigarettes  . Smokeless tobacco: Never Used  Substance and Sexual Activity  . Alcohol use: Yes    Comment: socially  . Drug use: Yes    Types: Marijuana  . Sexual activity: Not on file  Lifestyle  . Physical activity:    Days per week: Not on file    Minutes per session: Not on file  . Stress: Not on file  Relationships  . Social connections:    Talks on phone: Not on file    Gets together: Not on file    Attends religious service: Not on file    Active member of club or organization: Not on file    Attends meetings of clubs or organizations: Not on file    Relationship status: Not on file  .  Intimate partner violence:    Fear of current or ex partner: Not on file    Emotionally abused: Not on file    Physically abused: Not on file    Forced sexual activity: Not on file  Other Topics Concern  . Not on file  Social History Narrative  . Not on file     Family History  Problem Relation Age of Onset  . Diabetes Mother   . Diabetes Father      Review of Systems: General: negative for chills, fever, night sweats or weight changes.  Cardiovascular: negative for chest pain, dyspnea on exertion, edema, orthopnea, palpitations, paroxysmal nocturnal dyspnea or shortness of breath Dermatological: negative for rash Respiratory: negative for cough or wheezing Urologic: negative for hematuria Abdominal: negative for nausea, vomiting, diarrhea, bright red blood per rectum, melena, or hematemesis Neurologic: negative for visual changes, syncope, or dizziness All other systems  reviewed and are otherwise negative except as noted above.    Blood pressure (!) 122/92, pulse 83, height 6' (1.829 m), weight 298 lb (135.2 kg).  General appearance: alert, cooperative, no distress, morbidly obese and poor dentition Neck: no carotid bruit and no JVD Lungs: clear to auscultation bilaterally Heart: regular rate and rhythm Extremities: no edema Skin: Skin color, texture, turgor normal. No rashes or lesions Neurologic: Grossly normal   ASSESSMENT AND PLAN:   Systolic and diastolic CHF, chronic (HCC) His weight remains higher than when he was at [facility] in Jan, but he is not taking all his medications. Symptomatically he appears stable. We again discussed the importance of a low sodium diet.   Uncontrolled hypertension Repeat B/P by me 122/88- resume coreg  Dilated cardiomyopathy (HCC) EF 40-45% Jan 2019  CKD (chronic kidney disease) stage 3, GFR 30-59 ml/min (HCC) SCr 1.5 in Jan- check BMP   Non compliance w medication regimen He is struggling with finances.    PLAN  Resume medications- I did not increase his Coreg. When seen initially in Jan he was bradycardic. Check BMP. I encouraged him to look into Medicaid. F/U 3-4 months.   Corine ShelterLuke Normal Recinos PA-C 08/15/2017 8:32 AM

## 2017-08-15 NOTE — Assessment & Plan Note (Addendum)
SCr 1.5 in Jan- check BMP

## 2017-08-15 NOTE — Assessment & Plan Note (Signed)
He is struggling with finances.

## 2017-08-16 MED FILL — AMLODIPINE BESYLATE 10 MG T: 10 | 30 days supply | Qty: 30 | Fill #1

## 2017-08-16 MED FILL — CARVEDILOL 3.125 MG TABLET: 3.125 | 30 days supply | Qty: 60 | Fill #1

## 2017-08-16 MED FILL — LISINOPRIL 20 MG TAB: 20 | 30 days supply | Qty: 30 | Fill #1

## 2017-08-19 ENCOUNTER — Telehealth: Payer: Self-pay | Admitting: Cardiology

## 2017-08-19 NOTE — Telephone Encounter (Signed)
New Message:     Pt it returning call regarding labs

## 2017-08-22 ENCOUNTER — Other Ambulatory Visit: Payer: Self-pay

## 2017-08-22 DIAGNOSIS — I10 Essential (primary) hypertension: Secondary | ICD-10-CM

## 2017-08-22 DIAGNOSIS — I5042 Chronic combined systolic (congestive) and diastolic (congestive) heart failure: Secondary | ICD-10-CM

## 2017-08-22 MED ORDER — FUROSEMIDE 20 MG PO TABS: 20.0000 mg | ORAL_TABLET | Freq: Every day | ORAL | Status: DC

## 2017-08-22 NOTE — Telephone Encounter (Signed)
Patient notified directly of labs results and stated he had to take another call and will call me right back.  Awaiting his call.

## 2017-08-22 NOTE — Telephone Encounter (Signed)
Explained to patient in greater detail that about meds and repeat lab results. Patient voiced understanding.

## 2017-09-19 ENCOUNTER — Ambulatory Visit: Payer: Self-pay | Admitting: Internal Medicine

## 2017-09-26 MED FILL — LISINOPRIL 20 MG TAB: 20 | 30 days supply | Qty: 30 | Fill #2

## 2017-09-26 MED FILL — AMLODIPINE BESYLATE 10 MG T: 10 | 30 days supply | Qty: 30 | Fill #2

## 2017-09-26 MED FILL — FUROSEMIDE 40 MG TAB: 40 | 30 days supply | Qty: 30 | Fill #1

## 2017-09-26 MED FILL — CARVEDILOL 3.125 MG TABLET: 3.125 | 30 days supply | Qty: 60 | Fill #2

## 2017-10-10 MED FILL — hydrALAZINE HCL 50 MG TABS: 50 | 30 days supply | Qty: 90 | Fill #1

## 2017-10-13 ENCOUNTER — Encounter (HOSPITAL_COMMUNITY): Payer: Self-pay | Admitting: Emergency Medicine

## 2017-10-13 ENCOUNTER — Observation Stay (HOSPITAL_COMMUNITY)
Admission: EM | Admit: 2017-10-13 | Discharge: 2017-10-14 | Disposition: A | Discharge status: Home / Self Care | Payer: Self-pay | Attending: Internal Medicine | Admitting: Internal Medicine

## 2017-10-13 ENCOUNTER — Other Ambulatory Visit: Payer: Self-pay

## 2017-10-13 ENCOUNTER — Emergency Department (HOSPITAL_COMMUNITY): Payer: Self-pay

## 2017-10-13 DIAGNOSIS — N179 Acute kidney failure, unspecified: Secondary | ICD-10-CM | POA: Diagnosis present

## 2017-10-13 DIAGNOSIS — I5042 Chronic combined systolic (congestive) and diastolic (congestive) heart failure: Secondary | ICD-10-CM | POA: Insufficient documentation

## 2017-10-13 DIAGNOSIS — I420 Dilated cardiomyopathy: Secondary | ICD-10-CM | POA: Diagnosis present

## 2017-10-13 DIAGNOSIS — X58XXXA Exposure to other specified factors, initial encounter: Secondary | ICD-10-CM | POA: Insufficient documentation

## 2017-10-13 DIAGNOSIS — I130 Hypertensive heart and chronic kidney disease with heart failure and stage 1 through stage 4 chronic kidney disease, or unspecified chronic kidney disease: Secondary | ICD-10-CM | POA: Insufficient documentation

## 2017-10-13 DIAGNOSIS — F17210 Nicotine dependence, cigarettes, uncomplicated: Secondary | ICD-10-CM | POA: Insufficient documentation

## 2017-10-13 DIAGNOSIS — Z9114 Patient's other noncompliance with medication regimen: Secondary | ICD-10-CM

## 2017-10-13 DIAGNOSIS — Z87898 Personal history of other specified conditions: Secondary | ICD-10-CM | POA: Diagnosis present

## 2017-10-13 DIAGNOSIS — Z79899 Other long term (current) drug therapy: Secondary | ICD-10-CM | POA: Insufficient documentation

## 2017-10-13 DIAGNOSIS — N1831 Chronic kidney disease, stage 3a: Secondary | ICD-10-CM | POA: Diagnosis present

## 2017-10-13 DIAGNOSIS — I429 Cardiomyopathy, unspecified: Secondary | ICD-10-CM | POA: Diagnosis present

## 2017-10-13 DIAGNOSIS — N183 Chronic kidney disease, stage 3 (moderate): Secondary | ICD-10-CM | POA: Insufficient documentation

## 2017-10-13 DIAGNOSIS — E669 Obesity, unspecified: Secondary | ICD-10-CM | POA: Insufficient documentation

## 2017-10-13 DIAGNOSIS — Z7982 Long term (current) use of aspirin: Secondary | ICD-10-CM | POA: Insufficient documentation

## 2017-10-13 DIAGNOSIS — I10 Essential (primary) hypertension: Secondary | ICD-10-CM | POA: Diagnosis present

## 2017-10-13 DIAGNOSIS — Z6837 Body mass index (BMI) 37.0-37.9, adult: Secondary | ICD-10-CM | POA: Insufficient documentation

## 2017-10-13 DIAGNOSIS — T783XXA Angioneurotic edema, initial encounter: Principal | ICD-10-CM | POA: Insufficient documentation

## 2017-10-13 LAB — I-STAT CHEM 8, ED
BUN: 32 mg/dL — ABNORMAL HIGH (ref 6–20)
CREATININE: 2 mg/dL — AB (ref 0.61–1.24)
Calcium, Ion: 1.21 mmol/L (ref 1.15–1.40)
Chloride: 99 mmol/L — ABNORMAL LOW (ref 101–111)
Glucose, Bld: 102 mg/dL — ABNORMAL HIGH (ref 65–99)
HEMATOCRIT: 49 % (ref 39.0–52.0)
HEMOGLOBIN: 16.7 g/dL (ref 13.0–17.0)
POTASSIUM: 4 mmol/L (ref 3.5–5.1)
SODIUM: 137 mmol/L (ref 135–145)
TCO2: 29 mmol/L (ref 22–32)

## 2017-10-13 LAB — CBC WITH DIFFERENTIAL/PLATELET
Abs Immature Granulocytes: 0.1 10*3/uL (ref 0.0–0.1)
BASOS ABS: 0.1 10*3/uL (ref 0.0–0.1)
Basophils Relative: 1 %
EOS ABS: 0.7 10*3/uL (ref 0.0–0.7)
Eosinophils Relative: 9 %
HEMATOCRIT: 49.1 % (ref 39.0–52.0)
Hemoglobin: 15.9 g/dL (ref 13.0–17.0)
IMMATURE GRANULOCYTES: 1 %
LYMPHS ABS: 3.4 10*3/uL (ref 0.7–4.0)
Lymphocytes Relative: 45 %
MCH: 29.7 pg (ref 26.0–34.0)
MCHC: 32.4 g/dL (ref 30.0–36.0)
MCV: 91.8 fL (ref 78.0–100.0)
Monocytes Absolute: 0.7 10*3/uL (ref 0.1–1.0)
Monocytes Relative: 10 %
NEUTROS PCT: 34 %
Neutro Abs: 2.5 10*3/uL (ref 1.7–7.7)
Platelets: 232 10*3/uL (ref 150–400)
RBC: 5.35 MIL/uL (ref 4.22–5.81)
RDW: 14.7 % (ref 11.5–15.5)
WBC: 7.6 10*3/uL (ref 4.0–10.5)

## 2017-10-13 LAB — HEPATIC FUNCTION PANEL
ALT: 20 U/L (ref 17–63)
AST: 19 U/L (ref 15–41)
Albumin: 3.7 g/dL (ref 3.5–5.0)
Alkaline Phosphatase: 53 U/L (ref 38–126)
BILIRUBIN DIRECT: 0.1 mg/dL (ref 0.1–0.5)
BILIRUBIN INDIRECT: 0.7 mg/dL (ref 0.3–0.9)
BILIRUBIN TOTAL: 0.8 mg/dL (ref 0.3–1.2)
Total Protein: 6.8 g/dL (ref 6.5–8.1)

## 2017-10-13 LAB — CK: CK TOTAL: 303 U/L (ref 49–397)

## 2017-10-13 LAB — I-STAT TROPONIN, ED: Troponin i, poc: 0.01 ng/mL (ref 0.00–0.08)

## 2017-10-13 MED ORDER — PIPERACILLIN-TAZOBACTAM 3.375 G IVPB 30 MIN
3.3750 g | Freq: Once | INTRAVENOUS | Status: AC
  Administered 2017-10-13: 3.375 g via INTRAVENOUS
  Filled 2017-10-13: qty 50

## 2017-10-13 MED ORDER — FUROSEMIDE 40 MG PO TABS
40.0000 mg | ORAL_TABLET | Freq: Every day | ORAL | Status: DC
  Administered 2017-10-13 – 2017-10-14 (×2): 40 mg via ORAL
  Filled 2017-10-13: qty 2
  Filled 2017-10-13: qty 1

## 2017-10-13 MED ORDER — HYDRALAZINE HCL 50 MG PO TABS
50.0000 mg | ORAL_TABLET | Freq: Three times a day (TID) | ORAL | Status: DC
  Administered 2017-10-13 – 2017-10-14 (×3): 50 mg via ORAL
  Filled 2017-10-13 (×4): qty 1

## 2017-10-13 MED ORDER — CARVEDILOL 3.125 MG PO TABS
3.1250 mg | ORAL_TABLET | Freq: Two times a day (BID) | ORAL | Status: DC
  Administered 2017-10-13 – 2017-10-14 (×3): 3.125 mg via ORAL
  Filled 2017-10-13 (×4): qty 1

## 2017-10-13 MED ORDER — FAMOTIDINE IN NACL 20-0.9 MG/50ML-% IV SOLN
20.0000 mg | Freq: Once | INTRAVENOUS | Status: AC
  Administered 2017-10-13: 20 mg via INTRAVENOUS
  Filled 2017-10-13: qty 50

## 2017-10-13 MED ORDER — FAMOTIDINE IN NACL 20-0.9 MG/50ML-% IV SOLN
20.0000 mg | Freq: Two times a day (BID) | INTRAVENOUS | Status: DC
  Administered 2017-10-13: 20 mg via INTRAVENOUS
  Filled 2017-10-13: qty 50

## 2017-10-13 MED ORDER — EPINEPHRINE 0.3 MG/0.3ML IJ SOAJ
0.3000 mg | Freq: Once | INTRAMUSCULAR | Status: AC
  Administered 2017-10-13: 0.3 mg via INTRAMUSCULAR
  Filled 2017-10-13: qty 0.3

## 2017-10-13 MED ORDER — ASPIRIN EC 81 MG PO TBEC
81.0000 mg | DELAYED_RELEASE_TABLET | Freq: Every day | ORAL | Status: DC
  Administered 2017-10-13 – 2017-10-14 (×2): 81 mg via ORAL
  Filled 2017-10-13 (×2): qty 1

## 2017-10-13 MED ORDER — BISACODYL 10 MG RE SUPP: 10.0000 mg | Freq: Every day | RECTAL | Status: DC | PRN

## 2017-10-13 MED ORDER — NICOTINE 21 MG/24HR TD PT24
21.0000 mg | MEDICATED_PATCH | Freq: Every day | TRANSDERMAL | Status: DC
  Administered 2017-10-13 – 2017-10-14 (×2): 21 mg via TRANSDERMAL
  Filled 2017-10-13 (×2): qty 1

## 2017-10-13 MED ORDER — FAMOTIDINE IN NACL 20-0.9 MG/50ML-% IV SOLN
20.0000 mg | Freq: Two times a day (BID) | INTRAVENOUS | Status: DC
  Administered 2017-10-14: 20 mg via INTRAVENOUS
  Filled 2017-10-13: qty 50

## 2017-10-13 MED ORDER — DEXAMETHASONE SODIUM PHOSPHATE 10 MG/ML IJ SOLN
10.0000 mg | Freq: Two times a day (BID) | INTRAMUSCULAR | Status: DC
  Administered 2017-10-13 – 2017-10-14 (×2): 10 mg via INTRAVENOUS
  Filled 2017-10-13 (×2): qty 1

## 2017-10-13 MED ORDER — HEPARIN SODIUM (PORCINE) 5000 UNIT/ML IJ SOLN
5000.0000 [IU] | Freq: Three times a day (TID) | INTRAMUSCULAR | Status: DC
  Administered 2017-10-13 – 2017-10-14 (×3): 5000 [IU] via SUBCUTANEOUS
  Filled 2017-10-13 (×3): qty 1

## 2017-10-13 MED ORDER — SENNOSIDES-DOCUSATE SODIUM 8.6-50 MG PO TABS: 1.0000 | ORAL_TABLET | Freq: Every evening | ORAL | Status: DC | PRN

## 2017-10-13 MED ORDER — ISOSORBIDE DINITRATE 10 MG PO TABS
20.0000 mg | ORAL_TABLET | Freq: Three times a day (TID) | ORAL | Status: DC
  Administered 2017-10-13 – 2017-10-14 (×3): 20 mg via ORAL
  Filled 2017-10-13: qty 2
  Filled 2017-10-13: qty 1
  Filled 2017-10-13: qty 2
  Filled 2017-10-13 (×2): qty 1

## 2017-10-13 MED ORDER — ONDANSETRON HCL 4 MG PO TABS: 4.0000 mg | ORAL_TABLET | Freq: Four times a day (QID) | ORAL | Status: DC | PRN

## 2017-10-13 MED ORDER — SODIUM CHLORIDE 0.9 % IV SOLN
INTRAVENOUS | Status: DC
  Administered 2017-10-13: 14:00:00 via INTRAVENOUS

## 2017-10-13 MED ORDER — DEXAMETHASONE SODIUM PHOSPHATE 10 MG/ML IJ SOLN
10.0000 mg | Freq: Once | INTRAMUSCULAR | Status: DC
  Filled 2017-10-13: qty 1

## 2017-10-13 MED ORDER — DEXAMETHASONE SODIUM PHOSPHATE 10 MG/ML IJ SOLN
10.0000 mg | Freq: Once | INTRAMUSCULAR | Status: AC
  Administered 2017-10-13: 10 mg via INTRAVENOUS

## 2017-10-13 MED ORDER — HYDROCODONE-ACETAMINOPHEN 5-325 MG PO TABS: 1.0000 | ORAL_TABLET | ORAL | Status: DC | PRN

## 2017-10-13 MED ORDER — DIPHENHYDRAMINE HCL 50 MG/ML IJ SOLN
25.0000 mg | Freq: Once | INTRAMUSCULAR | Status: AC
  Administered 2017-10-13: 25 mg via INTRAVENOUS
  Filled 2017-10-13: qty 1

## 2017-10-13 MED ORDER — ACETAMINOPHEN 650 MG RE SUPP: 650.0000 mg | Freq: Four times a day (QID) | RECTAL | Status: DC | PRN

## 2017-10-13 MED ORDER — AMLODIPINE BESYLATE 10 MG PO TABS
10.0000 mg | ORAL_TABLET | Freq: Every day | ORAL | Status: DC
  Administered 2017-10-13 – 2017-10-14 (×2): 10 mg via ORAL
  Filled 2017-10-13: qty 2
  Filled 2017-10-13: qty 1

## 2017-10-13 MED ORDER — VANCOMYCIN HCL IN DEXTROSE 1-5 GM/200ML-% IV SOLN
1000.0000 mg | Freq: Once | INTRAVENOUS | Status: AC
  Administered 2017-10-13: 1000 mg via INTRAVENOUS
  Filled 2017-10-13: qty 200

## 2017-10-13 MED ORDER — ONDANSETRON HCL 4 MG/2ML IJ SOLN: 4.0000 mg | Freq: Four times a day (QID) | INTRAMUSCULAR | Status: DC | PRN

## 2017-10-13 MED ORDER — ACETAMINOPHEN 325 MG PO TABS: 650.0000 mg | ORAL_TABLET | Freq: Four times a day (QID) | ORAL | Status: DC | PRN

## 2017-10-13 NOTE — ED Notes (Signed)
Dinner tray ordered for pt

## 2017-10-13 NOTE — H&P (Addendum)
History and Physical    Zachary Hawkins WUJ:[id number] DOB: 12-19-1955 DOA: 10/13/2017   PCP: Lizbeth BarkHairston, Mandesia R, FNP   Patient coming from:  Home    Chief Complaint: Throat swelling  HPI: Zachary L Adriana SimasCook is a 62 y.o. male with medical history significant for HTN and ACE inhibitor, cardiomyopathy, with EF 40 to 45%, CKD stage III, CHF grade 2, obesity, presenting with 1 day history of worsening tongue and throat swelling, associated with shortness of breath.  Patient believes that these symptoms began after eating cherries.  He denies any worsening drooling.   He did notice a change in his voice.  He denies dysphagia, he denies any headaches or vision changes.  He denies any rashes.  He did not try any thing at home for the symptoms prior to presenting to the ED. he denies any fever or chills.  He denies any sick contacts.  He denies any chest pain, or palpitations.  He denies any cough.  Denies any abdominal pain, nausea or vomiting.  He denies any lower extremity swelling or calf pain.  He never had similar symptoms.  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  He smokes 1 pack a day of cigarettes.  He partake somebody chronic, last dose yesterday.  He denies any alcohol abuse.  ED Course:  BP (!) 143/98   Pulse 72   Temp 98.2 F (36.8 C) (Oral)   Resp 13   Ht 6\' 1"  (1.854 m)   Wt 131.5 kg (290 lb)   SpO2 100%   BMI 38.26 kg/m   White count 7.6, hemoglobin 15.9, creatinine 2 Troponin negative Neck x-ray showed enlarged palatine tonsils EKG sinus rhythm, some PVCs, no acute findings.  QTc normal Patient received Vanco and Zosyn, also received Decadron 10 mg IV at 440 2 in the morning, along with Pepcid and Benadryl. ENT consulted the patient, concluding that it was no epiglottitis.  This is angioedema, likely due to ACE inhibitors and to continue conservative management.   Review of Systems:  As per HPI otherwise all other systems reviewed and are  negative  Past Medical History:  Diagnosis Date  . Cardiomyopathy (HCC) 06/2017   EF 40-45%  . Chronic renal insufficiency, stage 3 (moderate) (HCC)   . Diastolic dysfunction 06/2017   grade 2   . Hypertension   . Obesity     History reviewed. No pertinent surgical history.  Social History Social History   Socioeconomic History  . Marital status: Single    Spouse name: Not on file  . Number of children: Not on file  . Years of education: Not on file  . Highest education level: Not on file  Occupational History  . Not on file  Social Needs  . Financial resource strain: Not on file  . Food insecurity:    Worry: Not on file    Inability: Not on file  . Transportation needs:    Medical: Not on file    Non-medical: Not on file  Tobacco Use  . Smoking status: Current Every Day Smoker    Packs/day: 1.00    Types: Cigarettes  . Smokeless tobacco: Never Used  Substance and Sexual Activity  . Alcohol use: Yes    Comment: socially  . Drug use: Yes    Types: Marijuana  . Sexual activity: Not on file  Lifestyle  . Physical activity:    Days per week: Not on file  Minutes per session: Not on file  . Stress: Not on file  Relationships  . Social connections:    Talks on phone: Not on file    Gets together: Not on file    Attends religious service: Not on file    Active member of club or organization: Not on file    Attends meetings of clubs or organizations: Not on file    Relationship status: Not on file  . Intimate partner violence:    Fear of current or ex partner: Not on file    Emotionally abused: Not on file    Physically abused: Not on file    Forced sexual activity: Not on file  Other Topics Concern  . Not on file  Social History Narrative  . Not on file     Allergies  Allergen Reactions  . Black Cherry Fruit Extract [Cherry Extract] Swelling    Tongue    Family History  Problem Relation Age of Onset  . Diabetes Mother   . Diabetes Father        Prior to Admission medications   Medication Sig Start Date End Date Taking? Authorizing Provider  amLODipine (NORVASC) 10 MG tablet Take 1 tablet (10 mg total) by mouth daily. 07/13/17  Yes Kilroy, Eda Paschal, PA-C  aspirin 81 MG EC tablet Take 1 tablet (81 mg total) by mouth daily. 06/23/17  Yes [name], FNP  carvedilol (COREG) 3.125 MG tablet Take 1 tablet (3.125 mg total) by mouth 2 (two) times daily with a meal. 07/13/17  Yes Kilroy, Luke K, PA-C  furosemide (LASIX) 40 MG tablet Take 40 mg by mouth daily. 09/26/17  Yes [provider]  hydrALAZINE (APRESOLINE) 50 MG tablet Take 1 tablet (50 mg total) by mouth 3 (three) times daily. 07/13/17 10/13/17 Yes Kilroy, Luke K, PA-C  isosorbide dinitrate (ISORDIL) 20 MG tablet Take 1 tablet (20 mg total) by mouth 3 (three) times daily. Patient taking differently: Take 20 mg by mouth daily.  07/13/17  Yes Kilroy, Luke K, PA-C  lisinopril (PRINIVIL,ZESTRIL) 20 MG tablet Take 1 tablet (20 mg total) by mouth daily. 07/22/17 10/20/17 Yes Kilroy, Luke K, PA-C  furosemide (LASIX) 20 MG tablet Take 1 tablet (20 mg total) by mouth daily. Patient not taking: Reported on 10/13/2017 08/22/17   Abelino Derrick, PA-C    Physical Exam:  Vitals:   10/13/17 0500 10/13/17 0530 10/13/17 0600 10/13/17 0630  BP: (!) 138/101 (!) 130/93 129/87 (!) 143/98  Pulse: 80 75 81 72  Resp: (!) 23 16 (!) 21 13  Temp:      TempSrc:      SpO2: 100% 99% 99% 100%  Weight:      Height:       Constitutional: NAD, calm, appears more comfortable after anti-inflammatory treatment Eyes: PERRL, mild orbital edema, and conjunctivae normal ENMT: Mucous membranes are moist, without exudate, posterior oropharyngeal edema.  Mild enlargement of tongue. voice hoarse Neck: normal, supple, no masses, no thyromegaly  Respiratory: clear to auscultation bilaterally, no wheezing, no crackles.  Mild rhonchi anteriorly normal respiratory effort  Cardiovascular: Regular rate and rhythm, 2 out  of systolic murmur, rubs or gallops. No extremity edema. 2+ pedal pulses. No carotid bruits.  Abdomen: Soft, non tender, obese no hepatosplenomegaly. Bowel sounds positive.  Musculoskeletal: no clubbing / cyanosis. Moves all extremities Skin: no jaundice, No lesions.  Neurologic: Sensation intact  Strength equal in all extremities Psychiatric:   Alert and oriented x 3.  Somnolent.  Labs on Admission: I have personally reviewed following labs and imaging studies  CBC: Recent Labs  Lab 10/13/17 0432 10/13/17 0450  WBC 7.6  --   NEUTROABS 2.5  --   HGB 15.9 16.7  HCT 49.1 49.0  MCV 91.8  --   PLT 232  --     Basic Metabolic Panel: Recent Labs  Lab 10/13/17 0450  NA 137  K 4.0  CL 99*  GLUCOSE 102*  BUN 32*  CREATININE 2.00*    GFR: Estimated Creatinine Clearance: 56.5 mL/min (A) (by C-G formula based on SCr of 2 mg/dL (H)).  Liver Function Tests: No results for input(s): AST, ALT, ALKPHOS, BILITOT, PROT, ALBUMIN in the last 168 hours. No results for input(s): LIPASE, AMYLASE in the last 168 hours. No results for input(s): AMMONIA in the last 168 hours.  Coagulation Profile: No results for input(s): INR, PROTIME in the last 168 hours.  Cardiac Enzymes: No results for input(s): CKTOTAL, CKMB, CKMBINDEX, TROPONINI in the last 168 hours.  BNP (last 3 results) No results for input(s): PROBNP in the last 8760 hours.  HbA1C: No results for input(s): HGBA1C in the last 72 hours.  CBG: No results for input(s): GLUCAP in the last 168 hours.  Lipid Profile: No results for input(s): CHOL, HDL, LDLCALC, TRIG, CHOLHDL, LDLDIRECT in the last 72 hours.  Thyroid Function Tests: No results for input(s): TSH, T4TOTAL, FREET4, T3FREE, THYROIDAB in the last 72 hours.  Anemia Panel: No results for input(s): VITAMINB12, FOLATE, FERRITIN, TIBC, IRON, RETICCTPCT in the last 72 hours.  Urine analysis: No results found for: COLORURINE, APPEARANCEUR, LABSPEC, PHURINE,  GLUCOSEU, HGBUR, BILIRUBINUR, KETONESUR, PROTEINUR, UROBILINOGEN, NITRITE, LEUKOCYTESUR  Sepsis Labs: @LABRCNTIP (procalcitonin:4,lacticidven:4) )No results found for this or any previous visit (from the past 240 hour(s)).   Radiological Exams on Admission: Dg Neck Soft Tissue  Result Date: 10/13/2017 CLINICAL DATA:  Throat swelling EXAM: NECK SOFT TISSUES - 1+ VIEW COMPARISON:  None. FINDINGS: Palatine tonsils are enlarged. The epiglottis also appears enlarged, but this may be exaggerated by patient positioning. Prevertebral soft tissues appear normal. IMPRESSION: Enlarged palatine tonsils. Prominence of the epiglottis may be exaggerated by positioning. If the patient's airway can be maintained in the supine position, CT of the neck would provide more definitive characterization. Electronically Signed   By: Deatra Robinson M.D.   On: 10/13/2017 05:03    EKG: Independently reviewed.  Assessment/Plan Principal Problem:   Angio-edema Active Problems:   Dilated cardiomyopathy (HCC)   Chronic combined systolic and diastolic heart failure (HCC)   Obesity with serious comorbidity   CKD (chronic kidney disease) stage 3, GFR 30-59 ml/min (HCC)   Uncontrolled hypertension   Non compliance w medication regimen  Angioedema: Significantly improved since presentation to ED. ENT consulted the patient, concluding that it was no epiglottitis. Given Benadryl and Decadron, and also given 1 dose of Vanco and Zosyn. Airway preserved. Inciting agent may be ACE inhibitors although the patient states that this may have been related to cherries.White count 7.6 Neck x-ray showed enlarged palatine tonsils.  Telemetry, observation  Continue Decadron twice daily  Benadryl prn Pepcid twice daily Aspiration precautions We will hold on further antibiotics as the patient is afebrile, vital signs are stable, and he is white count is normal  Tobacco abuse with nicotine withdrawal.  Smokes 1 pack a day.  He also partakes  marijuana -  Nicotine patch  -  Counseled cessation Check UDS  Chronic combined systolic and diastolic CHF / ICM, EF 40-45 %  weight 290 pounds.  EKG sinus rhythm, some PVCs, no acute findings, QTC normal.  Troponin negative Hold ACE inhibitor Continue the other medicines monitor I/Os and daily weights  02   Hypertension BP 143/98   Pulse 72   Continue home anti-hypertensive medications, but hold ACE inhibitors today   Acute on Chronic CKD stage 3 :   Current Cr is 2, his baseline about 1.5.  Not very compliant with his meds.  No swelling is noted in his lower extremities. Lab Results  Component Value Date   CREATININE 2.00 (H) 10/13/2017   CREATININE 1.94 (H) 08/15/2017   CREATININE 1.52 (H) 06/07/2017  Hold ACE inhibitors, hold Lasix today only, NSAIDs IVF  Follow BMP daily     DVT prophylaxis: Heparin subcu Code Status:    Full Family Communication:  Discussed with patient Disposition Plan: Expect patient to be discharged to home after condition improves Consults called:    ENT per EDP, Dr. Lazarus Salines Admission status: Stepdown observation   Marlowe Kays, PA-C [facility]   Amion text  [phone]   10/13/2017, 6:57 AM

## 2017-10-13 NOTE — ED Provider Notes (Addendum)
[facility] EMERGENCY DEPARTMENT Provider Note   CSN: [id number] Arrival date & time: 10/13/17  0423     History   Chief Complaint Chief Complaint  Patient presents with  . Throat Swelling    HPI Vinscent L Michalski is a 62 y.o. male.  The history is provided by the patient. The history is limited by the condition of the patient.  Sore Throat  This is a new problem. The current episode started 12 to 24 hours ago. The problem occurs constantly. The problem has been gradually worsening. Associated symptoms include shortness of breath. Pertinent negatives include no chest pain, no abdominal pain and no headaches. Nothing aggravates the symptoms. Nothing relieves the symptoms. He has tried nothing for the symptoms. The treatment provided no relief.  Difficulty speaking and breathing lying flat.  Is on Lisinopril but hasn't taken it today.  But thinks it is the black cherries.  No f/c/r.  Change in voice.  No CP.     Past Medical History:  Diagnosis Date  . Cardiomyopathy (HCC) 06/2017   EF 40-45%  . Chronic renal insufficiency, stage 3 (moderate) (HCC)   . Diastolic dysfunction 06/2017   grade 2   . Hypertension   . Obesity     Patient Active Problem List   Diagnosis Date Noted  . Non compliance w medication regimen 07/13/2017  . Systolic and diastolic CHF, chronic (HCC) 06/23/2017  . Uncontrolled hypertension 06/23/2017  . Hospital discharge follow-up 06/23/2017  . Chronic combined systolic and diastolic heart failure (HCC)   . Hypertensive urgency   . Obesity with serious comorbidity   . CKD (chronic kidney disease) stage 3, GFR 30-59 ml/min (HCC)   . Dilated cardiomyopathy (HCC) 06/06/2017  . Dyspnea 06/05/2017    History reviewed. No pertinent surgical history.      Home Medications    Prior to Admission medications   Medication Sig Start Date End Date Taking? Authorizing Provider  amLODipine (NORVASC) 10 MG tablet Take 1 tablet (10 mg total) by  mouth daily. 07/13/17  Yes Kilroy, Eda Paschal, PA-C  aspirin 81 MG EC tablet Take 1 tablet (81 mg total) by mouth daily. 06/23/17  Yes [name], FNP  carvedilol (COREG) 3.125 MG tablet Take 1 tablet (3.125 mg total) by mouth 2 (two) times daily with a meal. 07/13/17  Yes Kilroy, Luke K, PA-C  furosemide (LASIX) 40 MG tablet Take 40 mg by mouth daily. 09/26/17  Yes [provider]  hydrALAZINE (APRESOLINE) 50 MG tablet Take 1 tablet (50 mg total) by mouth 3 (three) times daily. 07/13/17 10/13/17 Yes Kilroy, Luke K, PA-C  isosorbide dinitrate (ISORDIL) 20 MG tablet Take 1 tablet (20 mg total) by mouth 3 (three) times daily. Patient taking differently: Take 20 mg by mouth daily.  07/13/17  Yes Kilroy, Luke K, PA-C  lisinopril (PRINIVIL,ZESTRIL) 20 MG tablet Take 1 tablet (20 mg total) by mouth daily. 07/22/17 10/20/17 Yes Kilroy, Luke K, PA-C  furosemide (LASIX) 20 MG tablet Take 1 tablet (20 mg total) by mouth daily. Patient not taking: Reported on 10/13/2017 08/22/17   Abelino Derrick, PA-C    Family History Family History  Problem Relation Age of Onset  . Diabetes Mother   . Diabetes Father     Social History Social History   Tobacco Use  . Smoking status: Current Every Day Smoker    Packs/day: 1.00    Types: Cigarettes  . Smokeless tobacco: Never Used  Substance Use Topics  .  Alcohol use: Yes    Comment: socially  . Drug use: Yes    Types: Marijuana     Allergies   Black cherry fruit extract [cherry extract]   Review of Systems Review of Systems  Unable to perform ROS: Acuity of condition  Constitutional: Negative for diaphoresis and fever.  HENT: Positive for sore throat, trouble swallowing and voice change. Negative for facial swelling.   Respiratory: Positive for shortness of breath.   Cardiovascular: Negative for chest pain.  Gastrointestinal: Negative for abdominal pain.  Neurological: Negative for headaches.     Physical Exam Updated Vital Signs BP 129/87    Pulse 81   Temp 98.2 F (36.8 C) (Oral)   Resp (!) 21   Ht 6\' 1"  (1.854 m)   Wt 131.5 kg (290 lb)   SpO2 99%   BMI 38.26 kg/m   Physical Exam  Constitutional: He appears well-developed and well-nourished. No distress.  HENT:  Head: Normocephalic and atraumatic.  Mouth/Throat: Posterior oropharyngeal edema present.  No swelling of the lips or tongue.  Unable to see over the tongue, positive plumy voice   Eyes: Pupils are equal, round, and reactive to light. Conjunctivae are normal.  Neck: Normal range of motion.  Cardiovascular: Normal rate, regular rhythm, normal heart sounds and intact distal pulses.  Pulmonary/Chest: Effort normal and breath sounds normal. No stridor. He has no wheezes.  Abdominal: Soft. Bowel sounds are normal. He exhibits no mass. There is no tenderness. There is no rebound and no guarding.  Musculoskeletal: Normal range of motion.  Lymphadenopathy:    He has no cervical adenopathy.  Neurological: He is alert. He displays normal reflexes.  Skin: Skin is warm and dry. Capillary refill takes less than 2 seconds.  Psychiatric: He has a normal mood and affect.     ED Treatments / Results  Labs (all labs ordered are listed, but only abnormal results are displayed) Results for orders placed or performed during the hospital encounter of 10/13/17  CBC with Differential/Platelet  Result Value Ref Range   WBC 7.6 4.0 - 10.5 K/uL   RBC 5.35 4.22 - 5.81 MIL/uL   Hemoglobin 15.9 13.0 - 17.0 g/dL   HCT 16.1 09.6 - 04.5 %   MCV 91.8 78.0 - 100.0 fL   MCH 29.7 26.0 - 34.0 pg   MCHC 32.4 30.0 - 36.0 g/dL   RDW 40.9 81.1 - 91.4 %   Platelets 232 150 - 400 K/uL   Neutrophils Relative % 34 %   Neutro Abs 2.5 1.7 - 7.7 K/uL   Lymphocytes Relative 45 %   Lymphs Abs 3.4 0.7 - 4.0 K/uL   Monocytes Relative 10 %   Monocytes Absolute 0.7 0.1 - 1.0 K/uL   Eosinophils Relative 9 %   Eosinophils Absolute 0.7 0.0 - 0.7 K/uL   Basophils Relative 1 %   Basophils Absolute  0.1 0.0 - 0.1 K/uL   Immature Granulocytes 1 %   Abs Immature Granulocytes 0.1 0.0 - 0.1 K/uL  I-stat chem 8, ed  Result Value Ref Range   Sodium 137 135 - 145 mmol/L   Potassium 4.0 3.5 - 5.1 mmol/L   Chloride 99 (L) 101 - 111 mmol/L   BUN 32 (H) 6 - 20 mg/dL   Creatinine, Ser 7.82 (H) 0.61 - 1.24 mg/dL   Glucose, Bld 956 (H) 65 - 99 mg/dL   Calcium, Ion 2.13 0.86 - 1.40 mmol/L   TCO2 29 22 - 32 mmol/L   Hemoglobin 16.7  13.0 - 17.0 g/dL   HCT 16.149.0 09.639.0 - 04.552.0 %  I-stat troponin, ED  Result Value Ref Range   Troponin i, poc 0.01 0.00 - 0.08 ng/mL   Comment 3           Dg Neck Soft Tissue  Result Date: 10/13/2017 CLINICAL DATA:  Throat swelling EXAM: NECK SOFT TISSUES - 1+ VIEW COMPARISON:  None. FINDINGS: Palatine tonsils are enlarged. The epiglottis also appears enlarged, but this may be exaggerated by patient positioning. Prevertebral soft tissues appear normal. IMPRESSION: Enlarged palatine tonsils. Prominence of the epiglottis may be exaggerated by positioning. If the patient's airway can be maintained in the supine position, CT of the neck would provide more definitive characterization. Electronically Signed   By: Deatra RobinsonKevin  Herman M.D.   On: 10/13/2017 05:03    EKG  EKG Interpretation  Date/Time:  Thursday October 13 2017 06:39:02 EDT Ventricular Rate:  76 PR Interval:    QRS Duration: 95 QT Interval:  379 QTC Calculation: 427 R Axis:   36 Text Interpretation:  Sinus rhythm Ventricular premature complex Nonspecific T abnormalities, lateral leads Confirmed by Nicanor AlconPalumbo, Maaliyah Adolph ([id number]) on 10/13/2017 6:42:24 AM       Radiology Dg Neck Soft Tissue  Result Date: 10/13/2017 CLINICAL DATA:  Throat swelling EXAM: NECK SOFT TISSUES - 1+ VIEW COMPARISON:  None. FINDINGS: Palatine tonsils are enlarged. The epiglottis also appears enlarged, but this may be exaggerated by patient positioning. Prevertebral soft tissues appear normal. IMPRESSION: Enlarged palatine tonsils. Prominence of the  epiglottis may be exaggerated by positioning. If the patient's airway can be maintained in the supine position, CT of the neck would provide more definitive characterization. Electronically Signed   By: Deatra RobinsonKevin  Herman M.D.   On: 10/13/2017 05:03    Procedures Procedures (including critical care time)  Medications Ordered in ED Medications  vancomycin (VANCOCIN) IVPB 1000 mg/200 mL premix (1,000 mg Intravenous New Bag/Given 10/13/17 0605)  piperacillin-tazobactam (ZOSYN) IVPB 3.375 g (3.375 g Intravenous New Bag/Given 10/13/17 0605)  diphenhydrAMINE (BENADRYL) injection 25 mg (has no administration in time range)  EPINEPHrine (EPI-PEN) injection 0.3 mg (0.3 mg Intramuscular Given 10/13/17 0442)  famotidine (PEPCID) IVPB 20 mg premix (0 mg Intravenous Stopped 10/13/17 0500)  dexamethasone (DECADRON) injection 10 mg (10 mg Intravenous Given 10/13/17 0442)    MDM Reviewed: previous chart, nursing note and vitals Interpretation: labs, ECG and x-ray (negative troponin) Total time providing critical care: 75-105 minutes. This excludes time spent performing separately reportable procedures and services. Consults: admitting MD and critical care (ENT)   CRITICAL CARE Performed by: Jasmine AwePALUMBO-RASCH,Aliyha Fornes K Total critical care time: 75 minutes Critical care time was exclusive of separately billable procedures and treating other patients. Critical care was necessary to treat or prevent imminent or life-threatening deterioration. Critical care was time spent personally by me on the following activities: development of treatment plan with patient and/or surrogate as well as nursing, discussions with consultants, evaluation of patient's response to treatment, examination of patient, obtaining history from patient or surrogate, ordering and performing treatments and interventions, ordering and review of laboratory studies, ordering and review of radiographic studies, pulse oximetry and re-evaluation of patient's  condition.  Final Clinical Impressions(s) / ED Diagnoses   Final diagnoses:  Angioedema, initial encounter   See by critical care and ENT, see notes.     Farhana Fellows, MD 10/13/17 [id number]    Cy BlamerPalumbo, Lanijah Warzecha, MD 10/13/17 [id number]    Cy BlamerPalumbo, Mallory Schaad, MD 10/13/17 [id number]

## 2017-10-13 NOTE — ED Triage Notes (Signed)
Pt in POV reporting throat swelling since last night, worsened this morning upon waking. Swelling noted to back of throat, muffled voice, labored breathing. EDP at bedside.

## 2017-10-13 NOTE — ED Notes (Signed)
ENT at bedside

## 2017-10-13 NOTE — ED Notes (Signed)
Pt sleeping at present- family at bedside

## 2017-10-13 NOTE — Consult Note (Signed)
Demonte, Dobratz 62 y.o., male [id number]     Chief Complaint: throat swelling  HPI: 62 yo bm, onset throat swelling 12 hrs ago. On ACE inhibitor.  No prior. Felt swelling of tongue and throat.  No pain.  Voice hoarse.  No fever.  Lat neck x-ray shows large tonsils and questionable early epiglottitis.  Better since early treatment.  Medical comorbidities.  ENT called for possible epiglottitis.    PMH: Past Medical History:  Diagnosis Date  . Cardiomyopathy (HCC) 06/2017   EF 40-45%  . Chronic renal insufficiency, stage 3 (moderate) (HCC)   . Diastolic dysfunction 06/2017   grade 2   . Hypertension   . Obesity     Surg JX:BJYNWGN reviewed. No pertinent surgical history.  FHx:   Family History  Problem Relation Age of Onset  . Diabetes Mother   . Diabetes Father    SocHx:  reports that he has been smoking cigarettes.  He has been smoking about 1.00 pack per day. He has never used smokeless tobacco. He reports that he drinks alcohol. He reports that he has current or past drug history. Drug: Marijuana.  ALLERGIES:  Allergies  Allergen Reactions  . Black Cherry Fruit Extract [Cherry Extract] Swelling    Tongue     (Not in a hospital admission)  Results for orders placed or performed during the hospital encounter of 10/13/17 (from the past 48 hour(s))  CBC with Differential/Platelet     Status: None   Collection Time: 10/13/17  4:32 AM  Result Value Ref Range   WBC 7.6 4.0 - 10.5 K/uL   RBC 5.35 4.22 - 5.81 MIL/uL   Hemoglobin 15.9 13.0 - 17.0 g/dL   HCT 56.2 13.0 - 86.5 %   MCV 91.8 78.0 - 100.0 fL   MCH 29.7 26.0 - 34.0 pg   MCHC 32.4 30.0 - 36.0 g/dL   RDW 78.4 69.6 - 29.5 %   Platelets 232 150 - 400 K/uL   Neutrophils Relative % 34 %   Neutro Abs 2.5 1.7 - 7.7 K/uL   Lymphocytes Relative 45 %   Lymphs Abs 3.4 0.7 - 4.0 K/uL   Monocytes Relative 10 %   Monocytes Absolute 0.7 0.1 - 1.0 K/uL   Eosinophils Relative 9 %   Eosinophils Absolute 0.7 0.0 - 0.7 K/uL   Basophils Relative 1 %   Basophils Absolute 0.1 0.0 - 0.1 K/uL   Immature Granulocytes 1 %   Abs Immature Granulocytes 0.1 0.0 - 0.1 K/uL    Comment: Performed at [facility], [address], [place], [place] [address]  I-stat troponin, ED     Status: None   Collection Time: 10/13/17  4:48 AM  Result Value Ref Range   Troponin i, poc 0.01 0.00 - 0.08 ng/mL   Comment 3            Comment: Due to the release kinetics of cTnI, a negative result within the first hours of the onset of symptoms does not rule out myocardial infarction with certainty. If myocardial infarction is still suspected, repeat the test at appropriate intervals.   I-stat chem 8, ed     Status: Abnormal   Collection Time: 10/13/17  4:50 AM  Result Value Ref Range   Sodium 137 135 - 145 mmol/L   Potassium 4.0 3.5 - 5.1 mmol/L   Chloride 99 (L) 101 - 111 mmol/L   BUN 32 (H) 6 - 20 mg/dL   Creatinine, Ser 2.44 (H)  0.61 - 1.24 mg/dL   Glucose, Bld 161102 (H) 65 - 99 mg/dL   Calcium, Ion 0.961.21 0.451.15 - 1.40 mmol/L   TCO2 29 22 - 32 mmol/L   Hemoglobin 16.7 13.0 - 17.0 g/dL   HCT 40.949.0 81.139.0 - 91.452.0 %   Dg Neck Soft Tissue  Result Date: 10/13/2017 CLINICAL DATA:  Throat swelling EXAM: NECK SOFT TISSUES - 1+ VIEW COMPARISON:  None. FINDINGS: Palatine tonsils are enlarged. The epiglottis also appears enlarged, but this may be exaggerated by patient positioning. Prevertebral soft tissues appear normal. IMPRESSION: Enlarged palatine tonsils. Prominence of the epiglottis may be exaggerated by positioning. If the patient's airway can be maintained in the supine position, CT of the neck would provide more definitive characterization. Electronically Signed   By: Deatra RobinsonKevin  Herman M.D.   On: 10/13/2017 05:03    Blood pressure 129/87, pulse 81, temperature 98.2 F (36.8 C), temperature source Oral, resp. rate (!) 21, height 6\' 1"  (1.854 m), weight 131.5 kg (290 lb), SpO2 99 %.  PHYSICAL EXAM: Overall appearance:  Raspy voice.  No  stridor.  Not warm to touch.   Head:  NCAT Ears:  clear Nose:  Congested.  No drainage. Oral Cavity: teeth in fair repair.  Bulky tongue but no obvious tongue or floor of mouth edema.   Oral Pharynx/Hypopharynx/Larynx:  Grossly swollen uvula.  Could not see tonsils.   Neuro: grossly intact Neck:  No swelling.  No nodes.  Flexible scope:  NP with clear saliva.  BOT not well seen.  Supraglottic larynx with no swelling.  Airway good.  Vocal cords mobile  Studies Reviewed:  Lat neck plain film.      Assessment/Plan   Oropharyngeal angioedema.  No epiglottitis.    Plan:  Per CCM.  Recheck my office 2 weeks please.    Flo ShanksWOLICKI, Adriano Bischof 10/13/2017, 6:11 AM

## 2017-10-14 ENCOUNTER — Telehealth: Payer: Self-pay | Admitting: *Deleted

## 2017-10-14 DIAGNOSIS — I10 Essential (primary) hypertension: Secondary | ICD-10-CM

## 2017-10-14 DIAGNOSIS — I420 Dilated cardiomyopathy: Secondary | ICD-10-CM

## 2017-10-14 DIAGNOSIS — N183 Chronic kidney disease, stage 3 (moderate): Secondary | ICD-10-CM

## 2017-10-14 DIAGNOSIS — Z6838 Body mass index (BMI) 38.0-38.9, adult: Secondary | ICD-10-CM

## 2017-10-14 LAB — CBC
HCT: 45.7 % (ref 39.0–52.0)
Hemoglobin: 14.8 g/dL (ref 13.0–17.0)
MCH: 29.3 pg (ref 26.0–34.0)
MCHC: 32.4 g/dL (ref 30.0–36.0)
MCV: 90.5 fL (ref 78.0–100.0)
PLATELETS: 233 10*3/uL (ref 150–400)
RBC: 5.05 MIL/uL (ref 4.22–5.81)
RDW: 14.6 % (ref 11.5–15.5)
WBC: 9.6 10*3/uL (ref 4.0–10.5)

## 2017-10-14 LAB — BASIC METABOLIC PANEL
Anion gap: 6 (ref 5–15)
BUN: 19 mg/dL (ref 6–20)
CALCIUM: 8.9 mg/dL (ref 8.9–10.3)
CO2: 27 mmol/L (ref 22–32)
Chloride: 104 mmol/L (ref 101–111)
Creatinine, Ser: 1.61 mg/dL — ABNORMAL HIGH (ref 0.61–1.24)
GFR calc Af Amer: 52 mL/min — ABNORMAL LOW (ref >60)
GFR, EST NON AFRICAN AMERICAN: 45 mL/min — AB (ref >60)
GLUCOSE: 139 mg/dL — AB (ref 65–99)
Potassium: 4.5 mmol/L (ref 3.5–5.1)
SODIUM: 137 mmol/L (ref 135–145)

## 2017-10-14 LAB — MRSA PCR SCREENING: MRSA BY PCR: NEGATIVE

## 2017-10-14 MED ORDER — PREDNISONE 20 MG PO TABS: ORAL_TABLET | ORAL | 0 refills | Status: DC

## 2017-10-14 MED ORDER — FAMOTIDINE 20 MG PO TABS: 20.0000 mg | ORAL_TABLET | Freq: Two times a day (BID) | ORAL | 0 refills | Status: DC

## 2017-10-14 MED ORDER — EPINEPHRINE 0.3 MG/0.3ML IJ SOAJ: 0.3000 mg | Freq: Once | INTRAMUSCULAR | 0 refills | Status: AC

## 2017-10-14 MED ORDER — FAMOTIDINE 20 MG PO TABS: 20.0000 mg | ORAL_TABLET | Freq: Two times a day (BID) | ORAL | Status: DC

## 2017-10-14 MED ORDER — PREDNISONE 50 MG PO TABS: 60.0000 mg | ORAL_TABLET | Freq: Two times a day (BID) | ORAL | Status: DC

## 2017-10-14 NOTE — Progress Notes (Signed)
Removed PIV access and pt received discharge instruction with prescriptions. Case manager talked pt regarding epi pen as well as place to buy. Pt understood it. Pt took his all belongings and left. HS McDonald's CorporationLee RN

## 2017-10-14 NOTE — Care Management Note (Addendum)
Case Management Note  Patient Details  Name: Zachary Hawkins MRN: [id number] Date of Birth: 10/05/1955  SubjecDorena Dewtive/Objective:  Pt admitted with angioedema                    Action/Plan:  PTA independent from home.  Pt is active with [facility] - pt will make post discharge follow up appt. Pt informed CM that he has not had time to provide required documents for blue card as required for medication assistance at clinic.  CM explained to pt that MATCH will only provide 30 day supply and he will have to go ahead and apply for blue card ASAP - pt stated he understood.   CM contacted [facility] and confirmed that they have an epi pen in stock.  CM provided pt MATCH letter (required an override due to cost).     Expected Discharge Date:  10/14/17               Expected Discharge Plan:  Home/Self Care  In-House Referral:     Discharge planning Services  [facility]  Post Acute Care Choice:    Choice offered to:     DME Arranged:    DME Agency:     HH Arranged:    HH Agency:     Status of Service:  Completed, signed off  If discussed at [employer] Meetings, dates discussed:    Additional Comments:  Cherylann ParrClaxton, Avantika Shere S, RN 10/14/2017, 12:09 PM

## 2017-10-14 NOTE — Progress Notes (Signed)
Notified MD that patient HR dropped to 23 then quickly recovered at 0301 he was sleeping in bed and asymptomatic. Patient also had some increased swelling in bilateral jaws of face. Denies any difficulty breathing oxygen sats are stable. I will continue to monitor.

## 2017-10-14 NOTE — Progress Notes (Signed)
EKG monitor shows irregular heart beat, but it's PVCs & PACs, Dr. Sharon SellerMcClung made aware of it and he checked on the monitor at the pt's room as well as aware of lower HR during night. Pt's going to be discharged today, according to Dr. Sharon SellerMcClung. HS McDonald[employer]Lee RN

## 2017-10-14 NOTE — Telephone Encounter (Signed)
Pharmacy called related to inactive MATCH card.  EDCM researched [facility] system to find that pt ID number was entered incorrectly.  EDCM corrected card and stayed on phone to insure card was active. Pt was able to purchase Rx.  Pharmacy called back stating Epi Pen would not go through as they only have brand name- not generic.  EDCM called back to get NDC number to add to find that pharmacy has fixed the problem.

## 2017-10-14 NOTE — Discharge Summary (Signed)
DISCHARGE SUMMARY  Zachary Hawkins  MR#: [id number]  DOB:09/28/1955  Date of Admission: 10/13/2017 Date of Discharge: 10/14/2017  Attending Physician:Jeffrey Silvestre Gunner McClung, MD  Patient's WUJ:[id number]:[name], [name], FNP  Consults:  ENT  Disposition: D/C home   Follow-up Appts: Follow-up Information    Lizbeth BarkHairston, Mandesia R, FNP. Schedule an appointment as soon as possible for a visit in 1 week(s).   Specialty:  Family Medicine       Croitoru, [name], MD .   Specialty:  Cardiology Contact information: [address] [place] [address] [phone]           Tests Needing Follow-up: -assess BP control -pt would benefit from allergy testing once he has completed his steroid taper   Discharge Diagnoses: Angioedema Tobacco abuse Chronic combined systolic and diastolic CHF/ICM HTN Acute renal injury on CKDstage 3  Initial presentation: 62 y.o.malew/ a hx of HTNon an ACE inhibitor, CM w/ EF 40-45%, CKD stage III, and obesity who presented w/ a 1 day history of worsening tongue and throat swelling associated with shortness of breath.  Hospital Course:  Angioedema ENT concluding this was not epiglottitis - inciting agent felt to be ACEi v/s black cherries - fully resolved at time of d/c and able to tolerate regular diet - counseled pt on warning signs of recurrence, and need to call 911 immediately if this occurs - counseled pt on risk of death if recurs - advised pt when to use epi-pen and advised would provide Rx - counseled pt that he should avoid ACEi and tell med professionals that his face swells when he takes them   Tobacco abuse smokes 1 pack a day - counseled on need for abstinence   Chronic combined systolic and diastolic CHF/ICM EF 40-45 % w/ baseline weight 290 pounds - weight stable at d/c w/ no clinical signs of gross volume overload  Filed Weights   10/13/17 0432 10/13/17 1900 10/14/17 0636  Weight: 131.5 kg (290 lb) 131.5 kg (289 lb  14.5 oz) 129.2 kg (284 lb 12.8 oz)    HTN Lisinopril stopped - pt encoured to take all other previously prescribed meds as prescribed (admitted to taking isordil only QD) - will need to f/u in Cardiology clinic where he is already established   Acute renal injury on CKDstage 3 baseline crt ~ 1.5 - crt has returned to his baseline at time of d/c    Allergies as of 10/14/2017      Reactions   Black Cherry Fruit Extract Valentino Saxon[cherry Extract] Swelling   Tongue      Medication List    STOP taking these medications   lisinopril 20 MG tablet Commonly known as:  PRINIVIL,ZESTRIL     TAKE these medications   amLODipine 10 MG tablet Commonly known as:  NORVASC Take 1 tablet (10 mg total) by mouth daily.   aspirin 81 MG EC tablet Take 1 tablet (81 mg total) by mouth daily.   carvedilol 3.125 MG tablet Commonly known as:  COREG Take 1 tablet (3.125 mg total) by mouth 2 (two) times daily with a meal.   EPINEPHrine 0.3 mg/0.3 mL Soaj injection Commonly known as:  EPI-PEN Inject 0.3 mLs (0.3 mg total) into the muscle once for 1 dose.   famotidine 20 MG tablet Commonly known as:  PEPCID Take 1 tablet (20 mg total) by mouth 2 (two) times daily.   furosemide 40 MG tablet Commonly known as:  LASIX Take 40 mg by mouth daily. What changed:  Another medication with the same name was removed. Continue taking this medication, and follow the directions you see here.   hydrALAZINE 50 MG tablet Commonly known as:  APRESOLINE Take 1 tablet (50 mg total) by mouth 3 (three) times daily.   isosorbide dinitrate 20 MG tablet Commonly known as:  ISORDIL Take 1 tablet (20 mg total) by mouth 3 (three) times daily. What changed:  when to take this   predniSONE 20 MG tablet Commonly known as:  DELTASONE Take 3 tablets 2x a day on 6/7 and 6/8 THEN take 3 tablets a day on 6/9 and 6/10, THEN take 1 tablet a day on 6/11 and 6/12 then STOP       Day of Discharge BP (!) 140/99   Pulse 92   Temp  97.7 F (36.5 C) (Oral)   Resp 17   Ht 6\' 1"  (1.854 m)   Wt 129.2 kg (284 lb 12.8 oz)   SpO2 94%   BMI 37.57 kg/m   Physical Exam: General: No acute respiratory distress Lungs: Clear to auscultation bilaterally without wheezes or crackles Cardiovascular: Regular rate and rhythm without murmur gallop or rub normal S1 and S2 Abdomen: Nontender, nondistended, soft, bowel sounds positive, no rebound, no ascites, no appreciable mass Extremities: No significant cyanosis, clubbing, or edema bilateral lower extremities  Basic Metabolic Panel: Recent Labs  Lab 10/13/17 0450 10/14/17 0353  NA 137 137  K 4.0 4.5  CL 99* 104  CO2  --  27  GLUCOSE 102* 139*  BUN 32* 19  CREATININE 2.00* 1.61*  CALCIUM  --  8.9    Liver Function Tests: Recent Labs  Lab 10/13/17 0445  AST 19  ALT 20  ALKPHOS 53  BILITOT 0.8  PROT 6.8  ALBUMIN 3.7   CBC: Recent Labs  Lab 10/13/17 0432 10/13/17 0450 10/14/17 0353  WBC 7.6  --  9.6  NEUTROABS 2.5  --   --   HGB 15.9 16.7 14.8  HCT 49.1 49.0 45.7  MCV 91.8  --  90.5  PLT 232  --  233    Cardiac Enzymes: Recent Labs  Lab 10/13/17 0445  CKTOTAL 303   BNP (last 3 results) Recent Labs    06/05/17 0848  BNP 117.2*    Recent Results (from the past 240 hour(s))  MRSA PCR Screening     Status: None   Collection Time: 10/13/17 11:10 PM  Result Value Ref Range Status   MRSA by PCR NEGATIVE NEGATIVE Final    Comment:        The GeneXpert MRSA Assay (FDA approved for NASAL specimens only), is one component of a comprehensive MRSA colonization surveillance program. It is not intended to diagnose MRSA infection nor to guide or monitor treatment for MRSA infections. Performed at [facility], [address] [place], [place] [address]      Time spent in discharge (includes decision making & examination of pt): 35 minutes  10/14/2017, 10:58 AM   Lonia Blood, MD [facility] Office  [phone] Pager  [phone]  On-Call/Text Page:      [email or web address]      password [facility]

## 2017-10-14 NOTE — Discharge Instructions (Signed)
Angioedema Angioedema is sudden swelling in the body. The swelling can happen in any part of the body. It often happens on the skin and causes itchy, bumpy patches (hives) to form. This condition may:  Happen only one time.  Happen more than one time. It may come back at random times.  Keep coming back for a number of years. Someday it may stop coming back.  Follow these instructions at home:  Take over-the-counter and prescription medicines only as told by your doctor.  If you were given medicines for emergency allergy treatment, always carry them with you.  Wear a medical bracelet as told by your doctor.  Avoid the things that cause your attacks (triggers).  If this condition was passed to you from your parents and you want to have kids, talk to your doctor. Your kids may also have this condition. Contact a doctor if:  You have another attack.  Your attacks happen more often, even after you take steps to prevent them.  This condition was passed to you by your parents and you want to have kids. Get help right away if:  Your mouth, tongue, or lips get very swollen.  You have trouble breathing.  You have trouble swallowing.  You pass out (faint). This information is not intended to replace advice given to you by your health care provider. Make sure you discuss any questions you have with your health care provider. Document Released: 04/14/2009 Document Revised: 11/26/2015 Document Reviewed: 11/04/2015 Elsevier Interactive Patient Education  2018 [employer]   Epinephrine Injection What is epinephrine? Epinephrine is a medicine that is given as a shot (injection) to temporarily treat a life-threatening allergic reaction. It may also be used to treat severe asthma attacks, other lung problems, and other emergency conditions. Epinephrine works by relaxing the muscles in the airways and tightening the blood vessels. Epinephrine comes in many forms, including what is commonly  called an auto-injector "pen" (pre-filled automatic epinephrine injection device). You may hear other names that mean the same thing, including epinephrine injection, epinephrine auto-injector pen, epinephrine pen, and automatic injection device. Why do I need epinephrine? You need epinephrine if you experience a severe asthma attack or a life-threatening allergic reaction (anaphylaxis). This injection can be lifesaving. You should always carry an auto-injector pen with you if you are at risk for anaphylaxis. When should I use my auto-injector pen? Use your auto-injector pen as soon as you think you are experiencing anaphylaxis or a severe asthmatic attack, as told by your health care provider. Anaphylaxis is very dangerous if it is not treated right away. Signs and symptoms of anaphylaxis may include:  Nasal congestion.  Tingling in the mouth.  An itchy, red rash.  Swelling of the eyes, lips, face, or tongue.  Swelling of the back of the mouth and the throat.  Wheezing.  A hoarse voice.  Itchy, red, swollen areas of skin (hives).  Dizziness or light-headedness.  Fainting.  Anxiety or confusion.  Abdominal pain.  Difficulty breathing, speaking, or swallowing.  Chest tightness.  Fast or irregular heartbeats (palpitations).  Vomiting.  Diarrhea.  These symptoms may represent a serious problem that is an emergency. Do not wait to see if the symptoms will go away. Use your auto-injector pen as you have been instructed, and get medical help right away. Call your local emergency services (911 in the [place] Do not drive yourself to the hospital. How do I use an auto-injector pen?  Use epinephrine exactly as told by your health  care provider. Do not inject it more often or in greater or smaller doses than your health care provider prescribed. Most auto-injector pens contain one dose of epinephrine. Some contain two doses.  You may use your auto-injector pen to give an injection  under your skin or into your muscle on the outer side of your thigh. Do not give yourself an injection into your buttocks or any other part of your body. ? In an emergency, you can use your auto-injector pen through your clothing. ? After you inject a dose of epinephrine, some liquid may remain in your auto-injector pen. This is normal.  If you need to give yourself a second dose of epinephrine, give the second injection in another location on your outer thigh. Do not give two injections in exactly the same place on your body. This can lead to tissue damage.  Talk with your pharmacist or health care provider if you have questions about how to inject epinephrine correctly. When should I seek immediate medical care? Seek emergency medical treatment immediately after you inject epinephrine. You may need additional medical care, and you may be monitored for side effects of epinephrine, such as:  Difficulty breathing.  Fast or irregular heartbeat.  Nausea or vomiting.  Sweating.  Dizziness.  Nervousness or anxiety.  Weakness.  Pale skin.  Headache.  Uncontrollable shaking.  This information is not intended to replace advice given to you by your health care provider. Make sure you discuss any questions you have with your health care provider. Document Released: 04/23/2000 Document Revised: 04/09/2016 Document Reviewed: 11/13/2014 Elsevier Interactive Patient Education  2017 [employer].

## 2017-10-18 LAB — CULTURE, BLOOD (ROUTINE X 2)
Culture: NO GROWTH
Culture: NO GROWTH
Special Requests: ADEQUATE

## 2017-10-20 ENCOUNTER — Encounter: Payer: Self-pay | Admitting: Internal Medicine

## 2017-10-20 ENCOUNTER — Ambulatory Visit: Payer: Self-pay | Attending: Internal Medicine | Admitting: Internal Medicine

## 2017-10-20 VITALS — BP 149/105 | HR 82 | Temp 98.4°F | Resp 16 | Ht 73.0 in | Wt 285.4 lb

## 2017-10-20 DIAGNOSIS — N183 Chronic kidney disease, stage 3 unspecified: Secondary | ICD-10-CM

## 2017-10-20 DIAGNOSIS — Z888 Allergy status to other drugs, medicaments and biological substances status: Secondary | ICD-10-CM | POA: Insufficient documentation

## 2017-10-20 DIAGNOSIS — Z833 Family history of diabetes mellitus: Secondary | ICD-10-CM | POA: Insufficient documentation

## 2017-10-20 DIAGNOSIS — F17210 Nicotine dependence, cigarettes, uncomplicated: Secondary | ICD-10-CM | POA: Insufficient documentation

## 2017-10-20 DIAGNOSIS — Z7952 Long term (current) use of systemic steroids: Secondary | ICD-10-CM | POA: Insufficient documentation

## 2017-10-20 DIAGNOSIS — F17200 Nicotine dependence, unspecified, uncomplicated: Secondary | ICD-10-CM

## 2017-10-20 DIAGNOSIS — Z09 Encounter for follow-up examination after completed treatment for conditions other than malignant neoplasm: Secondary | ICD-10-CM | POA: Insufficient documentation

## 2017-10-20 DIAGNOSIS — I10 Essential (primary) hypertension: Secondary | ICD-10-CM

## 2017-10-20 DIAGNOSIS — K047 Periapical abscess without sinus: Secondary | ICD-10-CM | POA: Insufficient documentation

## 2017-10-20 DIAGNOSIS — I420 Dilated cardiomyopathy: Secondary | ICD-10-CM | POA: Insufficient documentation

## 2017-10-20 DIAGNOSIS — Z91018 Allergy to other foods: Secondary | ICD-10-CM | POA: Insufficient documentation

## 2017-10-20 DIAGNOSIS — I5042 Chronic combined systolic (congestive) and diastolic (congestive) heart failure: Secondary | ICD-10-CM | POA: Insufficient documentation

## 2017-10-20 DIAGNOSIS — Z79899 Other long term (current) drug therapy: Secondary | ICD-10-CM | POA: Insufficient documentation

## 2017-10-20 DIAGNOSIS — G4733 Obstructive sleep apnea (adult) (pediatric): Secondary | ICD-10-CM | POA: Insufficient documentation

## 2017-10-20 DIAGNOSIS — E669 Obesity, unspecified: Secondary | ICD-10-CM | POA: Insufficient documentation

## 2017-10-20 DIAGNOSIS — I130 Hypertensive heart and chronic kidney disease with heart failure and stage 1 through stage 4 chronic kidney disease, or unspecified chronic kidney disease: Secondary | ICD-10-CM | POA: Insufficient documentation

## 2017-10-20 DIAGNOSIS — Z7982 Long term (current) use of aspirin: Secondary | ICD-10-CM | POA: Insufficient documentation

## 2017-10-20 MED ORDER — TRAMADOL HCL 50 MG PO TABS: 50.0000 mg | ORAL_TABLET | Freq: Three times a day (TID) | ORAL | 0 refills | Status: DC | PRN

## 2017-10-20 MED ORDER — AMOXICILLIN 500 MG PO CAPS
500.0000 mg | ORAL_CAPSULE | Freq: Three times a day (TID) | ORAL | 0 refills | Status: DC
Start: 2017-10-20 — End: 2018-10-06

## 2017-10-20 MED FILL — AMOXICILLIN 500 MG CAPSULE: 500 | 7 days supply | Qty: 21 | Fill #0

## 2017-10-20 MED FILL — traMADol HCL 50 MG TABS: 50 | 5 days supply | Qty: 15 | Fill #0

## 2017-10-20 MED FILL — ISOSORBIDE DN 20 MG TABLET: 20 | 30 days supply | Qty: 90 | Fill #1

## 2017-10-20 NOTE — Progress Notes (Signed)
Patient ID: Zachary Hawkins, male    DOB: 02-Apr-1956  MRN: [id number]  CC: re-establish; Dental Pain; and Hospitalization Follow-up   Subjective: Zachary Hawkins is a 62 y.o. male who presents for hospital follow-up and to establish with me as PCP. 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  Patient states that the swelling occurred a few hours after he ate some black cherries.  He was not sure whether the black cherries caused the swelling.  Lisinopril was discontinued upon recommendation of ENT.  Swelling resolved.  Patient given EpiPen on discharge.  He has had no further swelling.  He remains off lisinopril.  HTN/CHF/CKD: He reports compliance with medications.  He took them already for it today.  However on medication reconciliation it was found that he has not picked up isosorbide since March.   -He limit salt in the foods. -No chest pains/shortness of breath/lower extremity edema. -Weight is down 13 pounds since April -Not on any over-the-counter NSAIDs.  We discussed kidney function today.  GFR and creatinine back to baseline on hospital discharge  Complains of pain around 2 teeth in the left upper and lower jaw x2 days.  He has several cavities and teeth broken off in the gum.  No dental insurance.    Tob dependence: Smokes 3/4 pk/day down from 1 pk a day.  He would like to quit.  He knows that it is not good for his health. Given patch while in hosp and felt cravings were less  On further review of his chart I found that patient had sleep study done back in March which revealed moderate sleep apnea.  It looks like he has not had the titration study done.  Patient Active Problem List   Diagnosis Date Noted  . Angio-edema 10/13/2017  . Non compliance w medication regimen 07/13/2017    . Systolic and diastolic CHF, chronic (HCC) 06/23/2017  . Uncontrolled hypertension 06/23/2017  . Hospital discharge follow-up 06/23/2017  . Chronic combined systolic and diastolic heart failure (HCC)   . Hypertensive urgency   . Obesity with serious comorbidity   . CKD (chronic kidney disease) stage 3, GFR 30-59 ml/min (HCC)   . Dilated cardiomyopathy (HCC) 06/06/2017  . Dyspnea 06/05/2017     Current Outpatient Medications on File Prior to Visit  Medication Sig Dispense Refill  . amLODipine (NORVASC) 10 MG tablet Take 1 tablet (10 mg total) by mouth daily. 30 tablet 3  . aspirin 81 MG EC tablet Take 1 tablet (81 mg total) by mouth daily. 90 tablet 1  . carvedilol (COREG) 3.125 MG tablet Take 1 tablet (3.125 mg total) by mouth 2 (two) times daily with a meal. 60 tablet 6  . famotidine (PEPCID) 20 MG tablet Take 1 tablet (20 mg total) by mouth 2 (two) times daily. 12 tablet 0  . furosemide (LASIX) 40 MG tablet Take 40 mg by mouth daily.  6  . hydrALAZINE (APRESOLINE) 50 MG tablet Take 1 tablet (50 mg total) by mouth 3 (three) times daily. 90 tablet 6  . isosorbide dinitrate (ISORDIL) 20 MG tablet Take 1 tablet (20 mg total) by mouth 3 (three) times daily. (Patient taking differently: Take 20 mg by mouth daily. ) 30 tablet 6  . predniSONE (DELTASONE) 20 MG tablet Take 3 tablets 2x a day  on 6/7 and 6/8 THEN take 3 tablets a day on 6/9 and 6/10, THEN take 1 tablet a day on 6/11 and 6/12 then STOP 20 tablet 0   No current facility-administered medications on file prior to visit.     Allergies  Allergen Reactions  . Ace Inhibitors Anaphylaxis  . Black Cherry Fruit Extract [Cherry Extract] Anaphylaxis    Tongue    Social History   Socioeconomic History  . Marital status: Single    Spouse name: Not on file  . Number of children: Not on file  . Years of education: Not on file  . Highest education level: Not on file  Occupational History  . Not on file  Social Needs  . Financial  resource strain: Not on file  . Food insecurity:    Worry: Not on file    Inability: Not on file  . Transportation needs:    Medical: Not on file    Non-medical: Not on file  Tobacco Use  . Smoking status: Current Every Day Smoker    Packs/day: 1.00    Types: Cigarettes  . Smokeless tobacco: Never Used  Substance and Sexual Activity  . Alcohol use: Yes    Comment: socially  . Drug use: Yes    Types: Marijuana  . Sexual activity: Not on file  Lifestyle  . Physical activity:    Days per week: Not on file    Minutes per session: Not on file  . Stress: Not on file  Relationships  . Social connections:    Talks on phone: Not on file    Gets together: Not on file    Attends religious service: Not on file    Active member of club or organization: Not on file    Attends meetings of clubs or organizations: Not on file    Relationship status: Not on file  . Intimate partner violence:    Fear of current or ex partner: Not on file    Emotionally abused: Not on file    Physically abused: Not on file    Forced sexual activity: Not on file  Other Topics Concern  . Not on file  Social History Narrative  . Not on file    Family History  Problem Relation Age of Onset  . Diabetes Mother   . Diabetes Father     No past surgical history on file.  ROS: Review of Systems Negative except as stated above PHYSICAL EXAM: BP (!) 149/105   Pulse 82   Temp 98.4 F (36.9 C) (Oral)   Resp 16   Ht 6\' 1"  (1.854 m)   Wt 285 lb 6.4 oz (129.5 kg)   SpO2 95%   BMI 37.65 kg/m   Wt Readings from Last 3 Encounters:  10/20/17 285 lb 6.4 oz (129.5 kg)  10/14/17 284 lb 12.8 oz (129.2 kg)  08/15/17 298 lb (135.2 kg)   Repeat BP 130/98 Physical Exam  General appearance - alert, well appearing, middle-aged African-American male and in no distress Mental status - normal mood, behavior, speech, dress, motor activity, and thought processes Mouth -poor dental hygiene.  First molar in the left  upper jaw broken off in the gum.  Mild tenderness redness and small fluctuance around first molar in the lower jaw  neck -no cervical lymphadenopathy.  No thyroid enlargement. LN: No cervical or axillary lymphadenopathy Chest -breath sounds mild to moderately decreased bilaterally.  No crackles or rhonchi is heard Heart - normal rate, regular rhythm, normal  S1, S2, no murmurs, rubs, clicks or gallops Extremities -no edema of the lower extremities   Chemistry      Component Value Date/Time   NA 137 10/14/2017 0353   NA 138 08/15/2017 0850   K 4.5 10/14/2017 0353   CL 104 10/14/2017 0353   CO2 27 10/14/2017 0353   BUN 19 10/14/2017 0353   BUN 32 (H) 08/15/2017 0850   CREATININE 1.61 (H) 10/14/2017 0353      Component Value Date/Time   CALCIUM 8.9 10/14/2017 0353   ALKPHOS 53 10/13/2017 0445   AST 19 10/13/2017 0445   ALT 20 10/13/2017 0445   BILITOT 0.8 10/13/2017 0445     Lab Results  Component Value Date   WBC 9.6 10/14/2017   HGB 14.8 10/14/2017   HCT 45.7 10/14/2017   MCV 90.5 10/14/2017   PLT 233 10/14/2017      ASSESSMENT AND PLAN: 1. Uncontrolled hypertension -Continue to limit salt.  He does not have the device to monitor blood pressure. Patient to pick up isosorbide from pharmacy today. -Lisinopril discontinued due to angioedema requiring hospitalization  2. Tobacco dependence Patient advised to quit smoking. Discussed health risks associated with smoking including lung and other types of cancers, chronic lung diseases and CV risks.. Pt ready to give trail of quitting.  Discussed methods to help quit including quitting cold [place], use of NRT, Chantix and Bupropion.  Patient wanting to try the nicotine patches.  He has limited finances.  Informed him about 1 800 quit now and wrote down the information for him.  He plans to call to request the free patches.  3. CKD (chronic kidney disease) stage 3, GFR 30-59 ml/min (HCC) Stable.  Advised to avoid long-term use  of over-the-counter NSAIDs.  Good blood pressure control advised  4. Chronic combined systolic and diastolic CHF, NYHA class 2 (HCC) -Continue to limit salt. Continue carvedilol, lisinopril, hydralazine, and isosorbide  5. Dental infection -Advised patient that he needs to see a dentist. Limited 5-day supply of tramadol given to use as needed for pain. - amoxicillin (AMOXIL) 500 MG capsule; Take 1 capsule (500 mg total) by mouth 3 (three) times daily.  Dispense: 21 capsule; Refill: 0 - traMADol (ULTRAM) 50 MG tablet; Take 1 tablet (50 mg total) by mouth every 8 (eight) hours as needed.  Dispense: 15 tablet; Refill: 0  6.  OSA -I did not see the sleep study report at the time of patient office visit today.  But have not seen this report I will go ahead and submit the referral for titration study and have my nurse to inform him of this. Patient was given the opportunity to ask questions.  Patient verbalized understanding of the plan and was able to repeat key elements of the plan.   No orders of the defined types were placed in this encounter.    Requested Prescriptions   Signed Prescriptions Disp Refills  . amoxicillin (AMOXIL) 500 MG capsule 21 capsule 0    Sig: Take 1 capsule (500 mg total) by mouth 3 (three) times daily.  . traMADol (ULTRAM) 50 MG tablet 15 tablet 0    Sig: Take 1 tablet (50 mg total) by mouth every 8 (eight) hours as needed.    Return in about 7 weeks (around 12/08/2017).  Jonah Blueeborah Johnson, MD, FACP

## 2017-10-20 NOTE — Patient Instructions (Signed)
Call [phone] [phone] to request the Nicotine patches.   Try to get in to see a dentist.  Take the Amoxil antibiotics as prescribed.  Use the Tramadol as needed for pain.   You have not picked up a refill on your heart medication Isosorbide since March.  Please pick up today and continue taking.    Avoid taking any over the counter pain medications.  Okay to take Tylenol.

## 2017-11-04 MED FILL — CARVEDILOL 3.125 MG TABLET: 3.125 | 30 days supply | Qty: 60 | Fill #3

## 2017-11-04 MED FILL — FUROSEMIDE 40 MG TAB: 40 | 30 days supply | Qty: 30 | Fill #2

## 2017-11-04 MED FILL — AMLODIPINE BESYLATE 10 MG T: 10 | 30 days supply | Qty: 30 | Fill #3

## 2017-11-04 MED FILL — LISINOPRIL 20 MG TAB: 20 | 30 days supply | Qty: 30 | Fill #3

## 2017-11-23 ENCOUNTER — Ambulatory Visit (HOSPITAL_BASED_OUTPATIENT_CLINIC_OR_DEPARTMENT_OTHER): Payer: Self-pay

## 2017-12-07 ENCOUNTER — Other Ambulatory Visit: Payer: Self-pay | Admitting: Cardiology

## 2017-12-07 MED FILL — AMLODIPINE BESYLATE 10 MG T: 10 | 30 days supply | Qty: 30 | Fill #0

## 2017-12-07 MED FILL — FUROSEMIDE 40 MG TAB: 40 | 30 days supply | Qty: 30 | Fill #3

## 2017-12-07 MED FILL — hydrALAZINE HCL 50 MG TABS: 50 | 30 days supply | Qty: 90 | Fill #2

## 2017-12-22 MED FILL — CARVEDILOL 3.125 MG TABLET: 3.125 | 30 days supply | Qty: 60 | Fill #4

## 2017-12-22 MED FILL — ISOSORBIDE DN 20 MG TABLET: 20 | 10 days supply | Qty: 30 | Fill #2

## 2018-01-10 MED FILL — FUROSEMIDE 40 MG TAB: 40 | 30 days supply | Qty: 30 | Fill #4

## 2018-01-10 MED FILL — AMLODIPINE BESYLATE 10 MG T: 10 | 30 days supply | Qty: 30 | Fill #1

## 2018-01-10 MED FILL — ISOSORBIDE DN 20 MG TABLET: 20 | 30 days supply | Qty: 90 | Fill #0

## 2018-01-30 MED FILL — CARVEDILOL 3.125 MG TABLET: 3.125 | 30 days supply | Qty: 60 | Fill #5

## 2018-02-13 MED FILL — hydrALAZINE HCL 50 MG TABS: 50 | 30 days supply | Qty: 90 | Fill #3

## 2018-02-13 MED FILL — AMLODIPINE BESYLATE 10 MG T: 10 | 30 days supply | Qty: 30 | Fill #2

## 2018-02-13 MED FILL — FUROSEMIDE 40 MG TAB: 40 | 30 days supply | Qty: 30 | Fill #5

## 2018-03-29 MED FILL — AMLODIPINE BESYLATE 10 MG T: 10 | 30 days supply | Qty: 30 | Fill #3

## 2018-03-29 MED FILL — FUROSEMIDE 40 MG TAB: 40 | 30 days supply | Qty: 30 | Fill #6

## 2018-03-29 MED FILL — ISOSORBIDE DN 20 MG TABLET: 20 | 30 days supply | Qty: 90 | Fill #1

## 2018-03-29 MED FILL — CARVEDILOL 3.125 MG TABLET: 3.125 | 30 days supply | Qty: 60 | Fill #6

## 2018-05-11 MED FILL — CARVEDILOL 3.125 MG TABLET: 3.125 | 30 days supply | Qty: 60 | Fill #0

## 2018-05-11 MED FILL — FUROSEMIDE 40 MG TAB: 40 | 30 days supply | Qty: 30 | Fill #1

## 2018-05-11 MED FILL — hydrALAZINE HCL 50 MG TABS: 50 | 30 days supply | Qty: 90 | Fill #4

## 2018-05-11 MED FILL — AMLODIPINE BESYLATE 10 MG T: 10 | 30 days supply | Qty: 30 | Fill #4

## 2018-06-19 MED FILL — AMLODIPINE BESYLATE 10 MG T: 10 | 30 days supply | Qty: 30 | Fill #5

## 2018-06-19 MED FILL — ISOSORBIDE DN 20 MG TABLET: 20 | 30 days supply | Qty: 90 | Fill #2

## 2018-06-19 MED FILL — FUROSEMIDE 40 MG TAB: 40 | 30 days supply | Qty: 30 | Fill #2

## 2018-10-06 ENCOUNTER — Other Ambulatory Visit: Payer: Self-pay

## 2018-10-06 ENCOUNTER — Encounter: Payer: Self-pay | Admitting: Internal Medicine

## 2018-10-06 ENCOUNTER — Ambulatory Visit: Payer: Self-pay | Attending: Internal Medicine | Admitting: Internal Medicine

## 2018-10-06 DIAGNOSIS — N183 Chronic kidney disease, stage 3 unspecified: Secondary | ICD-10-CM

## 2018-10-06 DIAGNOSIS — Z6838 Body mass index (BMI) 38.0-38.9, adult: Secondary | ICD-10-CM

## 2018-10-06 DIAGNOSIS — G4733 Obstructive sleep apnea (adult) (pediatric): Secondary | ICD-10-CM

## 2018-10-06 DIAGNOSIS — I5042 Chronic combined systolic (congestive) and diastolic (congestive) heart failure: Secondary | ICD-10-CM

## 2018-10-06 DIAGNOSIS — F17200 Nicotine dependence, unspecified, uncomplicated: Secondary | ICD-10-CM

## 2018-10-06 DIAGNOSIS — I10 Essential (primary) hypertension: Secondary | ICD-10-CM

## 2018-10-06 MED ORDER — HYDRALAZINE HCL 50 MG PO TABS: 50.0000 mg | ORAL_TABLET | Freq: Three times a day (TID) | ORAL | 3 refills | Status: DC

## 2018-10-06 MED ORDER — FUROSEMIDE 40 MG PO TABS: 40.0000 mg | ORAL_TABLET | Freq: Every day | ORAL | 3 refills | Status: DC

## 2018-10-06 MED ORDER — ISOSORBIDE DINITRATE 20 MG PO TABS: 20.0000 mg | ORAL_TABLET | Freq: Three times a day (TID) | ORAL | 3 refills | Status: DC

## 2018-10-06 MED ORDER — CARVEDILOL 3.125 MG PO TABS: 3.1250 mg | ORAL_TABLET | Freq: Two times a day (BID) | ORAL | 3 refills | Status: DC

## 2018-10-06 MED ORDER — AMLODIPINE BESYLATE 10 MG PO TABS: 10.0000 mg | ORAL_TABLET | Freq: Every day | ORAL | 3 refills | Status: DC

## 2018-10-06 MED FILL — ?AMLODIPINE BESYLATE 10 MG: 10 | 30 days supply | Qty: 30 | Fill #0

## 2018-10-06 MED FILL — hydrALAZINE HCL 50 MG TABS: 50 | 30 days supply | Qty: 90 | Fill #0

## 2018-10-06 MED FILL — ?FUROSEMIDE 40 MG TABLET: 40 | 30 days supply | Qty: 30 | Fill #0

## 2018-10-06 MED FILL — ?CARVEDILOL 3.125 MG TABLET: 3.125 | 30 days supply | Qty: 60 | Fill #0

## 2018-10-06 MED FILL — ISOSORBIDE DN 20 MG TABLET: 20 | 30 days supply | Qty: 90 | Fill #0

## 2018-10-06 NOTE — Progress Notes (Signed)
Virtual Visit via Telephone Note Due to current restrictions/limitations of in-office visits due to the COVID-19 pandemic, this scheduled clinical appointment was converted to a telehealth visit  I connected with Zachary Hawkins on 10/06/18 at 10:24 a.m EDT by telephone and verified that I am speaking with the correct person using two identifiers. I am in my office.  The patient is at home.  Only the patient and myself participated in this encounter.  I discussed the limitations, risks, security and privacy concerns of performing an evaluation and management service by telephone and the availability of in person appointments. I also discussed with the patient that there may be a patient responsible charge related to this service. The patient expressed understanding and agreed to proceed.   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  Last seen 10/2017.  Pt states he did not come in because of limited finances.  Also has difficulties affording meds.  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  Still has Isordil, Hydralazine but out of Furosemide, Norvasc and Coreg -limits salt in foods.  Eats a lot of veggies -denies CP.  No SOB except when he does heavy lifting.  No LE edema, PND. Orthopnea   CKD 3:  Still makes good urine.  No OTC NSAIDS  OSA: On last visit with me, I submitted a referral for him to get the second part of his sleep study done.  Patient states that he was called but was unable to make the appointment at that time.  He currently does not have insurance or orange card/cone discount card.    Tobacco dependence: Smokes about 8 cigarettes a day.  He is trying to quit.  He states that he just needs to make up his mind and put them down completely.  Does not feel he  needs any help with quitting.  Obesity: Reports his weight has remained about the same around 285 pounds.  Admits that he can do better with his eating habits.  Not getting in much exercise.  States that he plans to join [employer] once he starts working again.  Outpatient Encounter Medications as of 10/06/2018  Medication Sig  . amLODipine (NORVASC) 10 MG tablet Take 1 tablet (10 mg total) by mouth daily.  Marland Kitchen. aspirin 81 MG EC tablet Take 1 tablet (81 mg total) by mouth daily.  . carvedilol (COREG) 3.125 MG tablet Take 1 tablet (3.125 mg total) by mouth 2 (two) times daily with a meal.  . famotidine (PEPCID) 20 MG tablet Take 1 tablet (20 mg total) by mouth 2 (two) times daily.  . furosemide (LASIX) 40 MG tablet Take 1 tablet (40 mg total) by mouth daily.  . hydrALAZINE (APRESOLINE) 50 MG tablet Take 1 tablet (50 mg total) by mouth 3 (three) times daily.  . isosorbide dinitrate (ISORDIL) 20 MG tablet Take 1 tablet (20 mg total) by mouth 3 (three) times daily.  . [DISCONTINUED] amLODipine (NORVASC) 10 MG tablet Take 1 tablet (10 mg total) by mouth daily.  . [DISCONTINUED] amoxicillin (AMOXIL) 500 MG capsule Take 1 capsule (500 mg total) by mouth 3 (three) times daily. (Patient not taking: Reported on 10/06/2018)  . [DISCONTINUED] carvedilol (COREG) 3.125 MG tablet Take 1 tablet (3.125 mg total) by mouth 2 (  two) times daily with a meal.  . [DISCONTINUED] furosemide (LASIX) 40 MG tablet Take 40 mg by mouth daily.  . [DISCONTINUED] hydrALAZINE (APRESOLINE) 50 MG tablet Take 1 tablet (50 mg total) by mouth 3 (three) times daily.  . [DISCONTINUED] isosorbide dinitrate (ISORDIL) 20 MG tablet Take 1 tablet (20 mg total) by mouth 3 (three) times daily. (Patient taking differently: Take 20 mg by mouth daily. )  . [DISCONTINUED] predniSONE (DELTASONE) 20 MG tablet Take 3 tablets 2x a day on 6/7 and 6/8 THEN take 3 tablets a day on 6/9 and 6/10, THEN take 1 tablet a day on 6/11 and 6/12 then STOP (Patient  not taking: Reported on 10/06/2018)  . [DISCONTINUED] traMADol (ULTRAM) 50 MG tablet Take 1 tablet (50 mg total) by mouth every 8 (eight) hours as needed. (Patient not taking: Reported on 10/06/2018)   No facility-administered encounter medications on file as of 10/06/2018.    Social History   Socioeconomic History  . Marital status: Single    Spouse name: Not on file  . Number of children: Not on file  . Years of education: Not on file  . Highest education level: Not on file  Occupational History  . Not on file  Social Needs  . Financial resource strain: Not on file  . Food insecurity:    Worry: Not on file    Inability: Not on file  . Transportation needs:    Medical: Not on file    Non-medical: Not on file  Tobacco Use  . Smoking status: Current Every Day Smoker    Packs/day: 1.00    Types: Cigarettes  . Smokeless tobacco: Never Used  Substance and Sexual Activity  . Alcohol use: Yes    Comment: socially  . Drug use: Yes    Types: Marijuana  . Sexual activity: Not on file  Lifestyle  . Physical activity:    Days per week: Not on file    Minutes per session: Not on file  . Stress: Not on file  Relationships  . Social connections:    Talks on phone: Not on file    Gets together: Not on file    Attends religious service: Not on file    Active member of club or organization: Not on file    Attends meetings of clubs or organizations: Not on file    Relationship status: Not on file  . Intimate partner violence:    Fear of current or ex partner: Not on file    Emotionally abused: Not on file    Physically abused: Not on file    Forced sexual activity: Not on file  Other Topics Concern  . Not on file  Social History Narrative  . Not on file      Observations/Objective: No direct observations done as this was a telephone encounter.  Lab Results  Component Value Date   WBC 9.6 10/14/2017   HGB 14.8 10/14/2017   HCT 45.7 10/14/2017   MCV 90.5 10/14/2017   PLT  233 10/14/2017     Chemistry      Component Value Date/Time   NA 137 10/14/2017 0353   NA 138 08/15/2017 0850   K 4.5 10/14/2017 0353   CL 104 10/14/2017 0353   CO2 27 10/14/2017 0353   BUN 19 10/14/2017 0353   BUN 32 (H) 08/15/2017 0850   CREATININE 1.61 (H) 10/14/2017 0353      Component Value Date/Time   CALCIUM 8.9 10/14/2017 0353   ALKPHOS  53 10/13/2017 0445   AST 19 10/13/2017 0445   ALT 20 10/13/2017 0445   BILITOT 0.8 10/13/2017 0445       Assessment and Plan: 1. Essential hypertension Level of control unknown but probably uncontrolled given that he has been out of some of his medications.  I have sent refills on his medicines to our pharmacy.  He plans to pick up next week Continue low-salt diet.  Encouraged regular exercise -Advised him to apply for the orange card/cone discount.  He will pick up the forms from the front desk when he comes to the lab next week - carvedilol (COREG) 3.125 MG tablet; Take 1 tablet (3.125 mg total) by mouth 2 (two) times daily with a meal.  Dispense: 60 tablet; Refill: 3 - hydrALAZINE (APRESOLINE) 50 MG tablet; Take 1 tablet (50 mg total) by mouth 3 (three) times daily.  Dispense: 90 tablet; Refill: 3 - isosorbide dinitrate (ISORDIL) 20 MG tablet; Take 1 tablet (20 mg total) by mouth 3 (three) times daily.  Dispense: 90 tablet; Refill: 3 - CBC; Future - Comprehensive metabolic panel; Future - Lipid panel; Future  2. Systolic and diastolic CHF, chronic (HCC) History suggest that he is compensated. Refill sent on medicines to the pharmacy.  Continue to limit salt in foods - carvedilol (COREG) 3.125 MG tablet; Take 1 tablet (3.125 mg total) by mouth 2 (two) times daily with a meal.  Dispense: 60 tablet; Refill: 3 - furosemide (LASIX) 40 MG tablet; Take 1 tablet (40 mg total) by mouth daily.  Dispense: 30 tablet; Refill: 3 - isosorbide dinitrate (ISORDIL) 20 MG tablet; Take 1 tablet (20 mg total) by mouth 3 (three) times daily.  Dispense:  90 tablet; Refill: 3 - CBC; Future - Comprehensive metabolic panel; Future - Lipid panel; Future  3. Class 2 severe obesity due to excess calories with serious comorbidity and body mass index (BMI) of 38.0 to 38.9 in adult [facility]) Dietary counseling given.  Advised to avoid sugary drinks and to cut back on white carbohydrates.  Encourage regular exercise at least 3 times a week for 30 minutes.  Patient advised to start low and go slow as he may only be able to do 10 minutes initially  4. CKD (chronic kidney disease) stage 3, GFR 30-59 ml/min (HCC) Encouraged him to come to the lab next week to have blood test done including test for kidney function.  Advised to avoid taking NSAIDs  5. OSA (obstructive sleep apnea) Encouraged him to apply for the orange card/cone discount so that we can refer him again for sleep study  6. Tobacco dependence Patient advised to quit smoking. Discussed health risks associated with smoking including lung and other types of cancers, chronic lung diseases and CV risks.. Pt ready to give trail of quitting.  Discussed methods to help quit including quitting cold [place], use of NRT, Chantix and Bupropion.  Patient does not feel that he needs any help with quitting.  Encouraged him to set a quit date.    Follow Up Instructions: F/u in 2 mths   I discussed the assessment and treatment plan with the patient. The patient was provided an opportunity to ask questions and all were answered. The patient agreed with the plan and demonstrated an understanding of the instructions.   The patient was advised to call back or seek an in-person evaluation if the symptoms worsen or if the condition fails to improve as anticipated.  I provided 14  minutes of non-face-to-face time during this  encounter.   Karle Plumber, MD

## 2018-10-09 ENCOUNTER — Ambulatory Visit: Payer: Self-pay | Admitting: Internal Medicine

## 2018-10-10 ENCOUNTER — Other Ambulatory Visit: Payer: Self-pay

## 2018-10-12 ENCOUNTER — Other Ambulatory Visit: Payer: Self-pay

## 2018-10-12 ENCOUNTER — Ambulatory Visit: Payer: Self-pay | Attending: Internal Medicine

## 2018-10-12 DIAGNOSIS — I5042 Chronic combined systolic (congestive) and diastolic (congestive) heart failure: Secondary | ICD-10-CM

## 2018-10-12 DIAGNOSIS — I10 Essential (primary) hypertension: Secondary | ICD-10-CM

## 2018-10-13 ENCOUNTER — Telehealth: Payer: Self-pay

## 2018-10-13 ENCOUNTER — Telehealth: Payer: Self-pay | Admitting: Internal Medicine

## 2018-10-13 ENCOUNTER — Other Ambulatory Visit: Payer: Self-pay | Admitting: Internal Medicine

## 2018-10-13 LAB — CBC
Hematocrit: 49.2 % (ref 37.5–51.0)
Hemoglobin: 16.5 g/dL (ref 13.0–17.7)
MCH: 29.5 pg (ref 26.6–33.0)
MCHC: 33.5 g/dL (ref 31.5–35.7)
MCV: 88 fL (ref 79–97)
Platelets: 256 10*3/uL (ref 150–450)
RBC: 5.6 x10E6/uL (ref 4.14–5.80)
RDW: 13.5 % (ref 11.6–15.4)
WBC: 6.7 10*3/uL (ref 3.4–10.8)

## 2018-10-13 LAB — COMPREHENSIVE METABOLIC PANEL
ALT: 21 IU/L (ref 0–44)
AST: 23 IU/L (ref 0–40)
Albumin/Globulin Ratio: 1.7 (ref 1.2–2.2)
Albumin: 4.2 g/dL (ref 3.8–4.9)
Alkaline Phosphatase: 84 IU/L (ref 39–117)
BUN/Creatinine Ratio: 16 (ref 10–24)
BUN: 24 mg/dL (ref 8–27)
Bilirubin Total: 0.3 mg/dL (ref 0.0–1.2)
CO2: 25 mmol/L (ref 20–29)
Calcium: 9.3 mg/dL (ref 8.6–10.2)
Chloride: 101 mmol/L (ref 96–106)
Creatinine, Ser: 1.49 mg/dL — ABNORMAL HIGH (ref 0.76–1.27)
GFR calc Af Amer: 58 mL/min/{1.73_m2} — ABNORMAL LOW (ref >59)
GFR calc non Af Amer: 50 mL/min/{1.73_m2} — ABNORMAL LOW (ref >59)
Globulin, Total: 2.5 g/dL (ref 1.5–4.5)
Glucose: 117 mg/dL — ABNORMAL HIGH (ref 65–99)
Potassium: 4.6 mmol/L (ref 3.5–5.2)
Sodium: 140 mmol/L (ref 134–144)
Total Protein: 6.7 g/dL (ref 6.0–8.5)

## 2018-10-13 LAB — LIPID PANEL
Chol/HDL Ratio: 4.4 ratio (ref 0.0–5.0)
Cholesterol, Total: 206 mg/dL — ABNORMAL HIGH (ref 100–199)
HDL: 47 mg/dL (ref >39)
LDL Calculated: 127 mg/dL — ABNORMAL HIGH (ref 0–99)
Triglycerides: 161 mg/dL — ABNORMAL HIGH (ref 0–149)
VLDL Cholesterol Cal: 32 mg/dL (ref 5–40)

## 2018-10-13 MED ORDER — ATORVASTATIN CALCIUM 10 MG PO TABS: 10.0000 mg | ORAL_TABLET | Freq: Every day | ORAL | 3 refills | Status: DC

## 2018-10-13 MED FILL — ATORVASTATIN 10 MG TABLET: 10 | 30 days supply | Qty: 30 | Fill #0

## 2018-10-13 NOTE — Telephone Encounter (Signed)
Patient called requesting results. Please follow up.

## 2018-10-13 NOTE — Telephone Encounter (Signed)
Contacted pt to go over lab results pt didn't answer lvm asking pt to give me a call back at his earliest convenience  

## 2018-10-16 NOTE — Telephone Encounter (Signed)
Returned pt call pt didn't answer lvm asking pt to give me a call back

## 2018-11-22 MED FILL — FUROSEMIDE 40 MG TAB: 40 | 30 days supply | Qty: 30 | Fill #1

## 2018-11-22 MED FILL — ?CARVEDILOL 3.125 MG TABLET: 3.125 | 30 days supply | Qty: 60 | Fill #1

## 2018-11-22 MED FILL — ?ATORVASTATIN 10 MG TABLET: 10 | 30 days supply | Qty: 30 | Fill #1

## 2018-11-22 MED FILL — ?AMLODIPINE BESYLATE 10 MG: 10 | 30 days supply | Qty: 30 | Fill #1

## 2019-01-01 MED FILL — ?ATORVASTATIN 10 MG TABLET: 10 | 30 days supply | Qty: 30 | Fill #2

## 2019-01-01 MED FILL — ?AMLODIPINE BESYLATE 10 MG: 10 | 30 days supply | Qty: 30 | Fill #2

## 2019-01-01 MED FILL — FUROSEMIDE 40 MG TAB: 40 | 30 days supply | Qty: 30 | Fill #2

## 2019-01-01 MED FILL — hydrALAZINE HCL 50 MG TABS: 50 | 30 days supply | Qty: 90 | Fill #1

## 2019-01-11 ENCOUNTER — Ambulatory Visit: Payer: Self-pay | Attending: Internal Medicine | Admitting: Internal Medicine

## 2019-01-16 ENCOUNTER — Telehealth: Payer: Self-pay | Admitting: *Deleted

## 2019-01-16 NOTE — Telephone Encounter (Signed)
!!!  Please schedule a Nurse or CPP appointment for vaccines!!! 

## 2019-02-13 MED FILL — ?FUROSEMIDE 40 MG TABLET: 40 | 30 days supply | Qty: 30 | Fill #3

## 2019-02-13 MED FILL — ?AMLODIPINE BESYLATE 10 MG: 10 | 30 days supply | Qty: 30 | Fill #3

## 2019-02-13 MED FILL — ISOSORBIDE DN 20 MG TABLET: 20 | 30 days supply | Qty: 90 | Fill #1

## 2019-02-13 MED FILL — ?CARVEDILOL 3.125 MG TABLET: 3.125 | 30 days supply | Qty: 60 | Fill #2

## 2019-02-13 MED FILL — ?ATORVASTATIN 10 MG TABLET: 10 | 30 days supply | Qty: 30 | Fill #3

## 2019-02-28 ENCOUNTER — Telehealth: Payer: Self-pay | Admitting: *Deleted

## 2019-02-28 NOTE — Telephone Encounter (Signed)
A message was left, re: his follow up visit. 

## 2019-03-16 ENCOUNTER — Ambulatory Visit: Payer: Self-pay | Admitting: Cardiology

## 2019-03-27 ENCOUNTER — Telehealth: Payer: Self-pay

## 2019-03-27 NOTE — Telephone Encounter (Signed)

## 2019-04-02 ENCOUNTER — Ambulatory Visit: Payer: Self-pay | Admitting: Cardiology

## 2019-04-03 ENCOUNTER — Encounter: Payer: Self-pay | Admitting: Cardiology

## 2019-04-03 ENCOUNTER — Telehealth: Payer: Self-pay | Admitting: *Deleted

## 2019-04-03 ENCOUNTER — Telehealth (INDEPENDENT_AMBULATORY_CARE_PROVIDER_SITE_OTHER): Payer: Self-pay | Admitting: Cardiology

## 2019-04-03 ENCOUNTER — Telehealth: Payer: Self-pay

## 2019-04-03 ENCOUNTER — Telehealth: Payer: Self-pay | Admitting: Licensed Clinical Social Worker

## 2019-04-03 VITALS — Ht 73.0 in | Wt 285.0 lb

## 2019-04-03 DIAGNOSIS — F17210 Nicotine dependence, cigarettes, uncomplicated: Secondary | ICD-10-CM

## 2019-04-03 DIAGNOSIS — E66812 Obesity, class 2: Secondary | ICD-10-CM

## 2019-04-03 DIAGNOSIS — I43 Cardiomyopathy in diseases classified elsewhere: Secondary | ICD-10-CM

## 2019-04-03 DIAGNOSIS — I5042 Chronic combined systolic (congestive) and diastolic (congestive) heart failure: Secondary | ICD-10-CM

## 2019-04-03 DIAGNOSIS — G4733 Obstructive sleep apnea (adult) (pediatric): Secondary | ICD-10-CM | POA: Insufficient documentation

## 2019-04-03 DIAGNOSIS — I420 Dilated cardiomyopathy: Secondary | ICD-10-CM

## 2019-04-03 DIAGNOSIS — Z91120 Patient's intentional underdosing of medication regimen due to financial hardship: Secondary | ICD-10-CM

## 2019-04-03 DIAGNOSIS — N1831 Chronic kidney disease, stage 3a: Secondary | ICD-10-CM

## 2019-04-03 DIAGNOSIS — I130 Hypertensive heart and chronic kidney disease with heart failure and stage 1 through stage 4 chronic kidney disease, or unspecified chronic kidney disease: Secondary | ICD-10-CM

## 2019-04-03 DIAGNOSIS — N183 Chronic kidney disease, stage 3 unspecified: Secondary | ICD-10-CM

## 2019-04-03 DIAGNOSIS — Z6837 Body mass index (BMI) 37.0-37.9, adult: Secondary | ICD-10-CM

## 2019-04-03 DIAGNOSIS — E669 Obesity, unspecified: Secondary | ICD-10-CM

## 2019-04-03 MED ORDER — ALBUTEROL SULFATE HFA 108 (90 BASE) MCG/ACT IN AERS: 2.0000 | INHALATION_SPRAY | Freq: Four times a day (QID) | RESPIRATORY_TRACT | 0 refills | Status: DC | PRN

## 2019-04-03 MED FILL — ALBUTEROL SULFATE HFA 108 (: 108 (90 BAS | 25 days supply | Qty: 18 | Fill #0

## 2019-04-03 NOTE — Telephone Encounter (Signed)
Called patient to confirm that he does not have any insurance. I also informed him per Ivin Booty at [facility] that since he does not have any insurance she can sell him a CPAP machine based on the [place] he had done in March of 2019. It would cost him $600.00 plus the cost of supplies. Patient states that he cannot afford to buy a machine or pay for a sleep study "as things stand right now." ordering provider will be notified.

## 2019-04-03 NOTE — Patient Instructions (Addendum)
Medication Instructions:  START Albuterol inhaler use 2 puffs every 6 hours as needed for shortness of breath or wheezing, HAVE YOUR PCP GIVE YOU ADDITIONAL REFILLS  *If you need a refill on your cardiac medications before your next appointment, please call your pharmacy*  Lab Work: NONE  If you have labs (blood work) drawn today and your tests are completely normal, you will receive your results only by: Marland Kitchen MyChart Message (if you have MyChart) OR . A paper copy in the mail If you have any lab test that is abnormal or we need to change your treatment, we will call you to review the results.  Testing/Procedures: Your physician has recommended that you have a sleep study. This test records several body functions during sleep, including: brain activity, eye movement, oxygen and carbon dioxide blood levels, heart rate and rhythm, breathing rate and rhythm, the flow of air through your mouth and nose, snoring, body muscle movements, and chest and belly movement. WANDA OUR SLEEP COORDINATOR WILL BE IN TOUCH WITH YOU SOON   Follow-Up: At [facility], you and your health needs are our priority.  As part of our continuing mission to provide you with exceptional heart care, we have created designated Provider Care Teams.  These Care Teams include your primary Cardiologist (physician) and Advanced Practice Providers (APPs -  Physician Assistants and Nurse Practitioners) who all work together to provide you with the care you need, when you need it.  Your next appointment:   6 month(s)  The format for your next appointment:   In Person  Provider:   Kerin Ransom, PA-C  Other Instructions Blood pressure cuff will be mailed to you.

## 2019-04-03 NOTE — Progress Notes (Addendum)
Virtual Visit via Telephone Note   This visit type was conducted due to national recommendations for restrictions regarding the COVID-19 Pandemic (e.g. social distancing) in an effort to limit this patient's exposure and mitigate transmission in our community.  Due to his co-morbid illnesses, this patient is at least at moderate risk for complications without adequate follow up.  This format is felt to be most appropriate for this patient at this time.  The patient did not have access to video technology/had technical difficulties with video requiring transitioning to audio format only (telephone).  All issues noted in this document were discussed and addressed.  No physical exam could be performed with this format.  Please refer to the patient's chart for his  consent to telehealth for [facility].   Date:  04/03/2019   ID:  Zachary Hawkins, Zachary Hawkins 10/27/1955, MRN [id number]  Patient Location: Home Provider Location: Home  PCP:  Ladell Pier, MD  Cardiologist:  Sanda Klein, MD  Electrophysiologist:  None   Evaluation Performed:  Follow-Up Visit  Chief Complaint:  occasional SOB  History of Present Illness:    Zachary Hawkins is a pleasant 63 y.o. male with a history of HTN and HTN CM with an EF of 40-45% Jan 2019, CRI-3, obesity, untreated OSA, and smoking. He was originally seen in consult 06/05/17 for bradycardia, accelerated HTN,renal insufficiency,and acute combined CHF. His EF was 40-45%with grade 2 DD and moderate LVH. His weight on admission was 294 lb, at discharge 285 lbs. His last office visit was April 2019.  In June 2019 he was admitted with angioedema from ACE-I.  His LOV with his PCP was May 2020.  He had been out of some of his medications for a few months secondary to financial issues.   He was contacted today for follow up cardiology visit.  He complains of intermittent SOB and asked about a PRN inhaler.  He denies orthopnea or chest pain. He does not follow a  low sodium diet or have a way to check his B/P at home.   The patient does not have symptoms concerning for COVID-19 infection (fever, chills, cough, or new shortness of breath).    Past Medical History:  Diagnosis Date  . Cardiomyopathy ([place]) 06/2017   EF 40-45%  . Chronic renal insufficiency, stage 3 (moderate)   . Diastolic dysfunction 12/6576   grade 2   . Hypertension   . Obesity    No past surgical history on file.   Current Meds  Medication Sig  . amLODipine (NORVASC) 10 MG tablet Take 1 tablet (10 mg total) by mouth daily.  Marland Kitchen aspirin 81 MG EC tablet Take 1 tablet (81 mg total) by mouth daily.  Marland Kitchen atorvastatin (LIPITOR) 10 MG tablet Take 1 tablet (10 mg total) by mouth daily.  . carvedilol (COREG) 3.125 MG tablet Take 1 tablet (3.125 mg total) by mouth 2 (two) times daily with a meal.  . famotidine (PEPCID) 20 MG tablet Take 1 tablet (20 mg total) by mouth 2 (two) times daily.  . furosemide (LASIX) 40 MG tablet Take 1 tablet (40 mg total) by mouth daily.  . hydrALAZINE (APRESOLINE) 50 MG tablet Take 1 tablet (50 mg total) by mouth 3 (three) times daily.  . isosorbide dinitrate (ISORDIL) 20 MG tablet Take 1 tablet (20 mg total) by mouth 3 (three) times daily.     Allergies:   Ace inhibitors and Black cherry fruit extract Marcelline Mates extract]   Social History  Tobacco Use  . Smoking status: Current Every Day Smoker    Packs/day: 1.00    Types: Cigarettes  . Smokeless tobacco: Never Used  Substance Use Topics  . Alcohol use: Yes    Comment: socially  . Drug use: Yes    Types: Marijuana     Family Hx: The patient's family history includes Diabetes in his father and mother.  ROS:   Please see the history of present illness.    All other systems reviewed and are negative.   Prior CV studies:   The following studies were reviewed today:  Echo Jan 2019  Labs/Other Tests and Data Reviewed:    EKG:  No ECG reviewed.  Recent Labs: 10/12/2018: ALT 21; BUN 24;  Creatinine, Ser 1.49; Hemoglobin 16.5; Platelets 256; Potassium 4.6; Sodium 140   Recent Lipid Panel Lab Results  Component Value Date/Time   CHOL 206 (H) 10/12/2018 08:42 AM   TRIG 161 (H) 10/12/2018 08:42 AM   HDL 47 10/12/2018 08:42 AM   CHOLHDL 4.4 10/12/2018 08:42 AM   LDLCALC 127 (H) 10/12/2018 08:42 AM    Wt Readings from Last 3 Encounters:  04/03/19 285 lb (129.3 kg)  10/20/17 285 lb 6.4 oz (129.5 kg)  10/14/17 284 lb 12.8 oz (129.2 kg)     Objective:    Vital Signs:  Ht 6\' 1"  (1.854 m)   Wt 285 lb (129.3 kg)   BMI 37.60 kg/m    VITAL SIGNS:  reviewed  ASSESSMENT & PLAN:    Chronic systolic and diastolic CHF Symptomatically he appears stable. We again discussed the importance of a low sodium diet.   Hypertension with HCM- I will see if we can provide him with a home cuff.  Dilated cardiomyopathy (HCC) EF 40-45%, moderate LVH, grade 2 DD- Jan 2019  CKD (chronic kidney disease) stage 3, GFR 30-59 ml/min (HCC) SCr 1.49 in June 2020  Non compliance w medication regimen He is struggling with finances.   Sleep apnea- After some investigation we found the company could provide him with a C-pap machine at cost based on his 2019 study but the patient has indicated he could not afford this. He has no insurance or Medicaid.   COVID-19 Education: The signs and symptoms of COVID-19 were discussed with the patient and how to seek care for testing (follow up with PCP or arrange E-visit).  The importance of social distancing was discussed today.  Time:   Today, I have spent 20 minutes with the patient with telehealth technology discussing the above problems.     Medication Adjustments/Labs and Tests Ordered: Current medicines are reviewed at length with the patient today.  Concerns regarding medicines are outlined above.   Tests Ordered: No orders of the defined types were placed in this encounter.   Medication Changes: No orders of the defined types were  placed in this encounter.   Follow Up:  In Person I will look into getting him C-pap or a repeat study.  I did give him an Rx for Albuertol inhaler PRN and asked him to f/u with his PCP.  We discussed low sodium diet and smoking cessation. F/U in person in May.   June, PA-C  04/03/2019 9:47 AM    [facility]

## 2019-04-03 NOTE — Telephone Encounter (Signed)
CSW referred to assist patient with obtaining a BP cuff. CSW contacted patient to inform cuff will be delivered to home. Patient grateful for support and assistance. CSW available as needed. Jackie Delfina Schreurs, LCSW, CCSW-MCS [phone]  

## 2019-04-03 NOTE — Telephone Encounter (Signed)
Contacted patient to discuss AVS Instructions. Gave patient Luke's recommendations from today's virtual office visit. Informed patient that someone from the scheduling dept will be in contact with them to schedule their follow up appt. Patient voiced understanding and AVS mailed.    

## 2019-04-04 ENCOUNTER — Other Ambulatory Visit: Payer: Self-pay | Admitting: Internal Medicine

## 2019-04-04 DIAGNOSIS — I5042 Chronic combined systolic (congestive) and diastolic (congestive) heart failure: Secondary | ICD-10-CM

## 2019-04-04 MED FILL — ?ATORVASTATIN 10 MG TABLET: 10 | 30 days supply | Qty: 30 | Fill #4

## 2019-04-04 MED FILL — hydrALAZINE HCL 50 MG TABS: 50 | 30 days supply | Qty: 90 | Fill #2

## 2019-04-16 ENCOUNTER — Ambulatory Visit: Payer: Self-pay | Admitting: Critical Care Medicine

## 2019-04-18 ENCOUNTER — Encounter: Payer: Self-pay | Admitting: Critical Care Medicine

## 2019-04-18 ENCOUNTER — Ambulatory Visit: Payer: Self-pay | Attending: Critical Care Medicine | Admitting: Critical Care Medicine

## 2019-04-18 ENCOUNTER — Other Ambulatory Visit: Payer: Self-pay

## 2019-04-18 DIAGNOSIS — R0609 Other forms of dyspnea: Secondary | ICD-10-CM

## 2019-04-18 DIAGNOSIS — R0600 Dyspnea, unspecified: Secondary | ICD-10-CM

## 2019-04-18 DIAGNOSIS — G4733 Obstructive sleep apnea (adult) (pediatric): Secondary | ICD-10-CM

## 2019-04-18 DIAGNOSIS — Z6838 Body mass index (BMI) 38.0-38.9, adult: Secondary | ICD-10-CM

## 2019-04-18 DIAGNOSIS — Z87898 Personal history of other specified conditions: Secondary | ICD-10-CM

## 2019-04-18 DIAGNOSIS — I420 Dilated cardiomyopathy: Secondary | ICD-10-CM

## 2019-04-18 MED ORDER — ALBUTEROL SULFATE HFA 108 (90 BASE) MCG/ACT IN AERS: 2.0000 | INHALATION_SPRAY | Freq: Four times a day (QID) | RESPIRATORY_TRACT | 1 refills | Status: DC | PRN

## 2019-04-18 MED ORDER — FLUTICASONE PROPIONATE 50 MCG/ACT NA SUSP: 2.0000 | Freq: Every day | NASAL | 6 refills | Status: DC

## 2019-04-18 MED ORDER — ADVAIR HFA 115-21 MCG/ACT IN AERO: 2.0000 | INHALATION_SPRAY | Freq: Two times a day (BID) | RESPIRATORY_TRACT | 12 refills | Status: DC

## 2019-04-18 MED ORDER — SALINE SPRAY 0.65 % NA SOLN: 2.0000 | Freq: Three times a day (TID) | NASAL | 0 refills | Status: DC

## 2019-04-18 MED ORDER — NICOTINE POLACRILEX 4 MG MT LOZG: LOZENGE | OROMUCOSAL | 4 refills | Status: DC

## 2019-04-18 MED FILL — !ADVAIR HFA 115-21 MCG INHA: 115-21 | 30 days supply | Qty: 12 | Fill #0

## 2019-04-18 MED FILL — FLUTICASONE PROP 50 MCG SPR: 50 | 30 days supply | Qty: 16 | Fill #0

## 2019-04-18 NOTE — Progress Notes (Signed)
Pt states he is urinating between the hours of  2am-6am and it messing up his sleep

## 2019-04-18 NOTE — Progress Notes (Signed)
Patient ID: Zachary Hawkins, male   DOB: January 22, 1956, 63 y.o.   MRN: [id number] Virtual Visit via Telephone Note  I connected with Vinscent L Debose on 04/18/19 at 10:00 AM EST by telephone and verified that I am speaking with the correct person using two identifiers.   Consent:  I discussed the limitations, risks, security and privacy concerns of performing an evaluation and management service by telephone and the availability of in person appointments. I also discussed with the patient that there may be a patient responsible charge related to this service. The patient expressed understanding and agreed to proceed.  Location of patient: Patient was at home  Location of provider: I was in the office  Persons participating in the televisit with the patient.   No one else on the call    History of Present Illness: This is a telephone visit and referral regarding patient's dyspnea and sleep apnea.  The patient has a history of sleep apnea but has noted increased nasal congestion increased right nostril obstruction asthma when he was younger and more wheezing more recently with productive cough.  The patient did have a sleep study in March 2019 however was never able to obtain a CPAP machine.  He has to sleep with his head elevated.  He has significant stress paying his bills and significant anxiety issues.  Also he is unable to afford repairs on his vehicle and gas so he has transportation barriers.  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  Note he does have an allergy to lisinopril which caused angioedema which required an emergency admission in the past.  He is short of breath at rest and some with exertion.  He has an albuterol inhaler and needs refills on this.  There is also a history of hypertension with cardiomyopathy ejection fraction 40% obesity and ongoing tobacco use.  His current weight is in 285 range.  Last seen in our office in May 2020.  He had run out  of some of his medications because he cannot afford them.  There is no chest pain noted at this time.  He does not follow a low-salt diet and does not check his blood pressure at home  Constitutional:   No  weight loss, night sweats,  Fevers, chills, fatigue, lassitude. HEENT:   No headaches,  Difficulty swallowing,  Tooth/dental problems,  Sore throat,                 sneezing, itching, ear ache, nasal congestion, post nasal drip,   CV:  No chest pain,  Orthopnea, PND, swelling in lower extremities, anasarca, dizziness, palpitations  GI  No heartburn, indigestion, abdominal pain, nausea, vomiting, diarrhea, change in bowel habits, loss of appetite  Resp:  shortness of breath with exertion or at rest.  No excess mucus,  productive cough,  No non-productive cough,  No coughing up of blood.  No change in color of mucus.  No wheezing.  No chest wall deformity  Skin: no rash or lesions.  GU: no dysuria, change in color of urine, no urgency or frequency.  No flank pain.  MS:  No joint pain or swelling.  No decreased range of motion.  No back pain.  Psych:  No change in mood or affect.  depression or anxiety.  No memory loss.  Observations/Objective: IMPRESSIONS - Moderate obstructive sleep apnea occurred during this study (AHI = 23.9/h). - No significant central sleep apnea occurred during this study (CAI =  0.4/h). - Oxygen desaturation was noted during this study (Min O2 = 86.0%). Mean 93.1%. Time less than/ equal 88%   11 minutes - The patient snored with moderate snoring volume. - EKG findings include PVCs. There were repeated long sinus pauses lasting up to 14 seconds.  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 - Consider cardiology/ heart monitor. - Positional therapy avoiding  supine position during sleep. - Be careful with alcohol, sedatives and other CNS depressants that may worsen sleep apnea and disrupt normal sleep architecture. - Sleep hygiene should be reviewed to assess factors that may improve sleep quality. - Weight management and regular exercise should be initiated or continued if appropriate.  Assessment and Plan: #1 obstructive sleep apnea not treated: This is likely contributing the patient's dyspnea.  I partnered with our nurse case manager and we will attempt to get the patient a CPAP machine based on his March 2019 sleep study through our patient assistance program orders were placed  #2 history of asthma I suspect this is recurrent with significant postnasal drainage history of angioedema and nasal congestion will recommend that we initiate Flonase 2 sprays each nostril daily and begin Advair 2 puffs twice daily and continue albuterol as needed Also recommended saline salt water spray for the nose  #3 tobacco use  . Current smoking consumption amount: 1 pack a day  . Dicsussion on advise to quit smoking and smoking impacts: Cardiovascular effects and effects on his breathing  . Patient's willingness to quit: Patient is interested in quitting  . Methods to quit smoking discussed: We discussed behavioral modification techniques as well as medication assisted treatment  . Medication management of smoking session drugs discussed: I recommended the use of nicotine replacement therapy and suggested nicotine lozenges 4 mg 3 times daily and a prescription was sent to our pharmacy  . Resources provided:  AVS   . Setting quit date the patient set a quit date for the first of the year  . Follow-up arranged in office follow-up as scheduled   Follow Up Instructions: Patient knows an in office exam will be scheduled in the next month   I discussed the assessment and treatment plan with the patient. The patient was provided an opportunity to ask  questions and all were answered. The patient agreed with the plan and demonstrated an understanding of the instructions.   The patient was advised to call back or seek an in-person evaluation if the symptoms worsen or if the condition fails to improve as anticipated.  I provided  30 minutes of non-face-to-face time during this encounter  including  median intraservice time , review of notes, labs, imaging, medications  and explaining diagnosis and management to the patient .    Shan Levans, MD

## 2019-04-19 ENCOUNTER — Telehealth: Payer: Self-pay

## 2019-04-19 ENCOUNTER — Encounter: Payer: Self-pay | Admitting: Critical Care Medicine

## 2019-04-19 NOTE — Assessment & Plan Note (Signed)
I note that this patient has significant history of angioedema due to ACE inhibitor's and allergy profile

## 2019-04-19 NOTE — Telephone Encounter (Signed)
Call placed to patient regarding order for autoPAP.  Informed him that the prescription would be sent to [facility] hardship program. They will be contacting him to about completing the hardship application.  The order for the machine will not be processed until he completes this application.  He stated that he understood.  Also reviewed with him the process of obtaining an [employer].  He can pick up that application when he comes to the pharmacy to pick up meds. He said he plans to speak to the pharmacist about programs for financial assistance so he can afford his medications.

## 2019-04-20 ENCOUNTER — Other Ambulatory Visit: Payer: Self-pay | Admitting: Internal Medicine

## 2019-04-20 DIAGNOSIS — I5042 Chronic combined systolic (congestive) and diastolic (congestive) heart failure: Secondary | ICD-10-CM

## 2019-04-20 MED FILL — ?FUROSEMIDE 40 MG TABLET: 40 | 30 days supply | Qty: 30 | Fill #0

## 2019-04-20 MED FILL — ?AMLODIPINE BESYLATE 10 MG: 10 | 30 days supply | Qty: 30 | Fill #0

## 2019-04-20 MED FILL — ?CARVEDILOL 3.125 MG TABLET: 3.125 | 30 days supply | Qty: 60 | Fill #3

## 2019-05-16 ENCOUNTER — Telehealth: Payer: Self-pay

## 2019-05-16 DIAGNOSIS — G4733 Obstructive sleep apnea (adult) (pediatric): Secondary | ICD-10-CM

## 2019-05-16 NOTE — Telephone Encounter (Signed)
Orders placed.

## 2019-05-16 NOTE — Telephone Encounter (Signed)
Message received from [facility] requesting settings for CPAP be included with the order

## 2019-05-16 NOTE — Telephone Encounter (Signed)
Updated order for CPAP with settings faxed to [facility]

## 2019-05-17 MED FILL — ?AMLODIPINE BESYLATE 10 MG: 10 | 30 days supply | Qty: 30 | Fill #0

## 2019-05-17 MED FILL — ?FUROSEMIDE 40 MG TABLET: 40 | 30 days supply | Qty: 30 | Fill #0

## 2019-05-18 MED FILL — ?ATORVASTATIN 10 MG TABLET: 10 | 30 days supply | Qty: 30 | Fill #5

## 2019-06-21 ENCOUNTER — Other Ambulatory Visit: Payer: Self-pay | Admitting: Internal Medicine

## 2019-06-21 DIAGNOSIS — I10 Essential (primary) hypertension: Secondary | ICD-10-CM

## 2019-06-21 DIAGNOSIS — I5042 Chronic combined systolic (congestive) and diastolic (congestive) heart failure: Secondary | ICD-10-CM

## 2019-06-21 MED FILL — ?CARVEDILOL 3.125 MG TABLET: 3.125 | 30 days supply | Qty: 60 | Fill #0

## 2019-06-21 MED FILL — ?AMLODIPINE BESYL 10MG TABL: 10 | 30 days supply | Qty: 30 | Fill #1

## 2019-06-21 MED FILL — ISOSORBIDE DN 20 MG TABLET: 20 | 30 days supply | Qty: 90 | Fill #2

## 2019-06-21 MED FILL — ?FUROSEMIDE 40 MG TABLET: 40 | 30 days supply | Qty: 30 | Fill #1

## 2019-06-21 MED FILL — hydrALAZINE HCL 50 MG TABS: 50 | 30 days supply | Qty: 90 | Fill #3

## 2019-06-21 MED FILL — ?ATORVASTATIN 10 MG TABLET: 10 | 30 days supply | Qty: 30 | Fill #6

## 2019-06-25 MED FILL — $ADVAIR HFA 115-21 MCG INH: 115-21 | 90 days supply | Qty: 36 | Fill #1

## 2019-06-25 MED FILL — $VENTOLIN HFA 18G INHALER: 108 (90 BAS | 25 days supply | Qty: 18 | Fill #0

## 2019-06-28 ENCOUNTER — Emergency Department (HOSPITAL_COMMUNITY): Payer: Self-pay

## 2019-06-28 ENCOUNTER — Emergency Department (HOSPITAL_COMMUNITY)
Admission: EM | Admit: 2019-06-28 | Discharge: 2019-06-28 | Disposition: A | Discharge status: Home / Self Care | Payer: Self-pay | Attending: Emergency Medicine | Admitting: Emergency Medicine

## 2019-06-28 ENCOUNTER — Other Ambulatory Visit: Payer: Self-pay

## 2019-06-28 ENCOUNTER — Encounter (HOSPITAL_COMMUNITY): Payer: Self-pay | Admitting: Pharmacy Technician

## 2019-06-28 DIAGNOSIS — I4891 Unspecified atrial fibrillation: Secondary | ICD-10-CM | POA: Insufficient documentation

## 2019-06-28 DIAGNOSIS — I5032 Chronic diastolic (congestive) heart failure: Secondary | ICD-10-CM | POA: Insufficient documentation

## 2019-06-28 DIAGNOSIS — R05 Cough: Secondary | ICD-10-CM | POA: Insufficient documentation

## 2019-06-28 DIAGNOSIS — N183 Chronic kidney disease, stage 3 unspecified: Secondary | ICD-10-CM | POA: Insufficient documentation

## 2019-06-28 DIAGNOSIS — I130 Hypertensive heart and chronic kidney disease with heart failure and stage 1 through stage 4 chronic kidney disease, or unspecified chronic kidney disease: Secondary | ICD-10-CM | POA: Insufficient documentation

## 2019-06-28 DIAGNOSIS — Z20822 Contact with and (suspected) exposure to covid-19: Secondary | ICD-10-CM | POA: Insufficient documentation

## 2019-06-28 DIAGNOSIS — R0981 Nasal congestion: Secondary | ICD-10-CM | POA: Insufficient documentation

## 2019-06-28 DIAGNOSIS — F17210 Nicotine dependence, cigarettes, uncomplicated: Secondary | ICD-10-CM | POA: Insufficient documentation

## 2019-06-28 LAB — BASIC METABOLIC PANEL
Anion gap: 8 (ref 5–15)
BUN: 20 mg/dL (ref 8–23)
CO2: 25 mmol/L (ref 22–32)
Calcium: 8.6 mg/dL — ABNORMAL LOW (ref 8.9–10.3)
Chloride: 104 mmol/L (ref 98–111)
Creatinine, Ser: 1.55 mg/dL — ABNORMAL HIGH (ref 0.61–1.24)
GFR calc Af Amer: 55 mL/min — ABNORMAL LOW (ref >60)
GFR calc non Af Amer: 48 mL/min — ABNORMAL LOW (ref >60)
Glucose, Bld: 128 mg/dL — ABNORMAL HIGH (ref 70–99)
Potassium: 4.2 mmol/L (ref 3.5–5.1)
Sodium: 137 mmol/L (ref 135–145)

## 2019-06-28 LAB — SARS CORONAVIRUS 2 (TAT 6-24 HRS): SARS Coronavirus 2: NEGATIVE

## 2019-06-28 LAB — CBC
HCT: 52.5 % — ABNORMAL HIGH (ref 39.0–52.0)
Hemoglobin: 16.7 g/dL (ref 13.0–17.0)
MCH: 29.7 pg (ref 26.0–34.0)
MCHC: 31.8 g/dL (ref 30.0–36.0)
MCV: 93.3 fL (ref 80.0–100.0)
Platelets: 243 10*3/uL (ref 150–400)
RBC: 5.63 MIL/uL (ref 4.22–5.81)
RDW: 14.6 % (ref 11.5–15.5)
WBC: 6.2 10*3/uL (ref 4.0–10.5)
nRBC: 0 % (ref 0.0–0.2)

## 2019-06-28 LAB — BRAIN NATRIURETIC PEPTIDE: B Natriuretic Peptide: 32.2 pg/mL (ref 0.0–100.0)

## 2019-06-28 MED ORDER — APIXABAN 5 MG PO TABS: 5.0000 mg | ORAL_TABLET | Freq: Two times a day (BID) | ORAL | 0 refills | Status: DC

## 2019-06-28 MED ORDER — APIXABAN 5 MG PO TABS
5.0000 mg | ORAL_TABLET | Freq: Once | ORAL | Status: AC
  Administered 2019-06-28: 5 mg via ORAL
  Filled 2019-06-28: qty 1

## 2019-06-28 MED ORDER — DILTIAZEM HCL 25 MG/5ML IV SOLN
10.0000 mg | Freq: Once | INTRAVENOUS | Status: AC
  Administered 2019-06-28: 10 mg via INTRAVENOUS
  Filled 2019-06-28: qty 5

## 2019-06-28 MED FILL — ELIQUIS 5 MG TABLET: 5 | 30 days supply | Qty: 60 | Fill #0

## 2019-06-28 NOTE — Discharge Instructions (Signed)
You were evaluated in the Emergency Department and after careful evaluation, we did not find any emergent condition requiring admission or further testing in the hospital.  Your exam/testing today was overall reassuring.  Your symptoms today seem to be due to atrial fibrillation.  Please follow-up in the atrial fibrillation clinic and take the blood thinner medication as directed.  You also have some symptoms that could be explained by a viral illness, possibly the coronavirus. We have tested you for the coronavirus here in the Emergency Department.  Please isolate or quarantine at home until you receive a negative test result.  If positive, we recommend continued home quarantine per [facility] recommendations.   Please return to the Emergency Department if you experience any worsening of your condition.  We encourage you to follow up with a primary care provider.  Thank you for allowing [place] to be a part of your care.    Information on my medicine - ELIQUIS (apixaban)  Why was Eliquis prescribed for you? Eliquis was prescribed for you to reduce the risk of a blood clot forming that can cause a stroke if you have a medical condition called atrial fibrillation (a type of irregular heartbeat).  What do You need to know about Eliquis ? Take your Eliquis TWICE DAILY - one tablet in the morning and one tablet in the evening with or without food. If you have difficulty swallowing the tablet whole please discuss with your pharmacist how to take the medication safely.  Take Eliquis exactly as prescribed by your doctor and DO NOT stop taking Eliquis without talking to the doctor who prescribed the medication.  Stopping may increase your risk of developing a stroke.  Refill your prescription before you run out.  After discharge, you should have regular check-up appointments with your healthcare provider that is prescribing your Eliquis.  In the future your dose may need to be changed if your kidney function  or weight changes by a significant amount or as you get older.  What do you do if you miss a dose? If you miss a dose, take it as soon as you remember on the same day and resume taking twice daily.  Do not take more than one dose of ELIQUIS at the same time to make up a missed dose.  Important Safety Information A possible side effect of Eliquis is bleeding. You should call your healthcare provider right away if you experience any of the following: Bleeding from an injury or your nose that does not stop. Unusual colored urine (red or dark brown) or unusual colored stools (red or black). Unusual bruising for unknown reasons. A serious fall or if you hit your head (even if there is no bleeding).  Some medicines may interact with Eliquis and might increase your risk of bleeding or clotting while on Eliquis. To help avoid this, consult your healthcare provider or pharmacist prior to using any new prescription or non-prescription medications, including herbals, vitamins, non-steroidal anti-inflammatory drugs (NSAIDs) and supplements.  This website has more information on Eliquis (apixaban): http://www.eliquis.com/eliquis/home

## 2019-06-28 NOTE — ED Notes (Signed)
Patient Alert and oriented to baseline. Stable and ambulatory to baseline. Patient verbalized understanding of the discharge instructions.  Patient belongings were taken by the patient.   

## 2019-06-28 NOTE — ED Provider Notes (Signed)
[facility] Emergency Department Provider Note MRN:  [id number]  Arrival date & time: 06/28/19     Chief Complaint   Shortness of Breath   History of Present Illness   Zachary Hawkins is a 64 y.o. year-old male with a history of CHF, hypertension, CKD presenting to the ED with chief complaint of shortness of breath.  2 or 3 days of gradually worsening shortness of breath.  Also endorsing nasal congestion, mild cough.  Shortness of breath began to bother him more significantly today, so here for evaluation.  Denies chest pain, no abdominal pain, no vomiting, no diarrhea, no numbness or weakness.  Symptoms moderate, constant, worse with exertion.  Review of Systems  A complete 10 system review of systems was obtained and all systems are negative except as noted in the HPI and PMH.   Patient's Health History    Past Medical History:  Diagnosis Date  . Cardiomyopathy (HCC) 06/2017   EF 40-45%  . Chronic renal insufficiency, stage 3 (moderate)   . Diastolic dysfunction 06/2017   grade 2   . Hypertension   . Hypertensive urgency   . Obesity     History reviewed. No pertinent surgical history.  Family History  Problem Relation Age of Onset  . Diabetes Mother   . Diabetes Father     Social History   Socioeconomic History  . Marital status: Single    Spouse name: Not on file  . Number of children: Not on file  . Years of education: Not on file  . Highest education level: Not on file  Occupational History  . Not on file  Tobacco Use  . Smoking status: Current Every Day Smoker    Packs/day: 1.00    Types: Cigarettes  . Smokeless tobacco: Never Used  Substance and Sexual Activity  . Alcohol use: Yes    Comment: socially  . Drug use: Yes    Types: Marijuana  . Sexual activity: Not on file  Other Topics Concern  . Not on file  Social History Narrative  . Not on file   Social Determinants of Health   Financial Resource Strain:   . Difficulty  of Paying Living Expenses: Not on file  Food Insecurity:   . Worried About Programme researcher, broadcasting/film/video in the Last Year: Not on file  . Ran Out of Food in the Last Year: Not on file  Transportation Needs:   . Lack of Transportation (Medical): Not on file  . Lack of Transportation (Non-Medical): Not on file  Physical Activity:   . Days of Exercise per Week: Not on file  . Minutes of Exercise per Session: Not on file  Stress:   . Feeling of Stress : Not on file  Social Connections:   . Frequency of Communication with Friends and Family: Not on file  . Frequency of Social Gatherings with Friends and Family: Not on file  . Attends Religious Services: Not on file  . Active Member of Clubs or Organizations: Not on file  . Attends Banker Meetings: Not on file  . Marital Status: Not on file  Intimate Partner Violence:   . Fear of Current or Ex-Partner: Not on file  . Emotionally Abused: Not on file  . Physically Abused: Not on file  . Sexually Abused: Not on file     Physical Exam   Vitals:   06/28/19 1000 06/28/19 1015  BP:  (!) 132/104  Pulse: 91 (!) 53  Resp: Marland Kitchen)  22 15  Temp:    SpO2: 94% 97%    CONSTITUTIONAL: Well-appearing, NAD NEURO:  Alert and oriented x 3, no focal deficits EYES:  eyes equal and reactive ENT/NECK:  no LAD, no JVD CARDIO: Tachycardic and irregular rate, well-perfused, normal S1 and S2 PULM:  CTAB no wheezing or rhonchi GI/GU:  normal bowel sounds, non-distended, non-tender MSK/SPINE:  No gross deformities, no edema SKIN:  no rash, atraumatic PSYCH:  Appropriate speech and behavior  *Additional and/or pertinent findings included in MDM below  Diagnostic and Interventional Summary    EKG Interpretation  Date/Time:  Thursday June 28 2019 07:51:17 EST Ventricular Rate:  109 PR Interval:    QRS Duration: 87 QT Interval:  362 QTC Calculation: 488 R Axis:   19 Text Interpretation: Atrial fibrillation  Confirmed by Gerlene Fee [phone])  on 06/28/2019 7:56:35 AM     No prior history of A. Fib  Cardiac Monitoring Interpretation: Cardiac monitoring was ordered to monitor the patient for dysrhythmia.  I personally interpreted the patient's cardiac monitor while at the bedside. 06/28/2019 11:01 AM Atrial fibrillation with rates between 70 and 115  Labs Reviewed  CBC - Abnormal; Notable for the following components:      Result Value   HCT 52.5 (*)    All other components within normal limits  BASIC METABOLIC PANEL - Abnormal; Notable for the following components:   Glucose, Bld 128 (*)    Creatinine, Ser 1.55 (*)    Calcium 8.6 (*)    GFR calc non Af Amer 48 (*)    GFR calc Af Amer 55 (*)    All other components within normal limits  SARS CORONAVIRUS 2 (TAT 6-24 HRS)  BRAIN NATRIURETIC PEPTIDE    DG Chest Port 1 View  Final Result      Medications  apixaban (ELIQUIS) tablet 5 mg (has no administration in time range)  diltiazem (CARDIZEM) injection 10 mg (10 mg Intravenous Given 06/28/19 0912)     Procedures  /  Critical Care Procedures  ED Course and Medical Decision Making  I have reviewed the triage vital signs, the nursing notes, and pertinent available records from the EMR.  Pertinent labs & imaging results that were available during my care of the patient were reviewed by me and considered in my medical decision making (see below for details).     Patient appears to be in atrial fibrillation with generally controlled rates between 70 and 115.  This could be the cause of his gradual shortness of breath over the past few days, or the A. fib could be caused by CHF exacerbation.  No evidence of DVT on exam, shortness of breath was gradual, doubt PE at this time.  Shortness of breath could also be explained by viral process or pneumonia.  Will provide small dose diltiazem to attempt rate/rhythm control, patient is a poor cardioversion candidate at this time given that he is not anticoagulated and his symptoms have  been present for 2 or 3 days.  Anticipating discharge with follow-up in the A. fib clinic, will likely have to start anticoagulation here.  CHA2DS2-VASc Score = 2 The patient's score is based upon: CHF History: Yes HTN History: Yes Age : < 65 Diabetes History: No Stroke History: No Vascular Disease History: No Gender: Male {  Zachary L Bielinski was evaluated in Emergency Department on 06/28/2019 for the symptoms described in the history of present illness. He was evaluated in the context of the global COVID-19 pandemic, which  necessitated consideration that the patient might be at risk for infection with the SARS-CoV-2 virus that causes COVID-19. Institutional protocols and algorithms that pertain to the evaluation of patients at risk for COVID-19 are in a state of rapid change based on information released by regulatory bodies including the CDC and federal and state organizations. These policies and algorithms were followed during the patient's care in the ED.   11 AM update: Patient continues to look and feel well, small dose diltiazem did not convert patient to sinus.  Rates now in the 60s.  Brief episode of bradycardia in the 30s during sleep but with no clinical change or concerns.  No evidence of CHF exacerbation, patient is appropriate for discharge with A. fib clinic follow-up.  Will educate and start patient on apixaban with the help of pharmacy consultation.  Signed,  Sabas Sous, MD    06/28/2019 11:01 AM     Elmer Sow. Pilar Plate, MD [facility] Emergency Medicine [facility] [email or web address]  Final Clinical Impressions(s) / ED Diagnoses     ICD-10-CM   1. Atrial fibrillation, unspecified type [facility])  I48.91     ED Discharge Orders         Ordered    apixaban (ELIQUIS) 5 MG TABS tablet  2 times daily     06/28/19 1056    Amb Referral to AFIB Clinic     06/28/19 1101           Discharge Instructions Discussed with and Provided to Patient:      Discharge Instructions     Information on my medicine - ELIQUIS (apixaban)  Why was Eliquis prescribed for you? Eliquis was prescribed for you to reduce the risk of a blood clot forming that can cause a stroke if you have a medical condition called atrial fibrillation (a type of irregular heartbeat).  What do You need to know about Eliquis ? Take your Eliquis TWICE DAILY - one tablet in the morning and one tablet in the evening with or without food. If you have difficulty swallowing the tablet whole please discuss with your pharmacist how to take the medication safely.  Take Eliquis exactly as prescribed by your doctor and DO NOT stop taking Eliquis without talking to the doctor who prescribed the medication.  Stopping may increase your risk of developing a stroke.  Refill your prescription before you run out.  After discharge, you should have regular check-up appointments with your healthcare provider that is prescribing your Eliquis.  In the future your dose may need to be changed if your kidney function or weight changes by a significant amount or as you get older.  What do you do if you miss a dose? If you miss a dose, take it as soon as you remember on the same day and resume taking twice daily.  Do not take more than one dose of ELIQUIS at the same time to make up a missed dose.  Important Safety Information A possible side effect of Eliquis is bleeding. You should call your healthcare provider right away if you experience any of the following: ? Bleeding from an injury or your nose that does not stop. ? Unusual colored urine (red or dark brown) or unusual colored stools (red or black). ? Unusual bruising for unknown reasons. ? A serious fall or if you hit your head (even if there is no bleeding).  Some medicines may interact with Eliquis and might increase your risk of bleeding or clotting while  on Eliquis. To help avoid this, consult your healthcare provider or  pharmacist prior to using any new prescription or non-prescription medications, including herbals, vitamins, non-steroidal anti-inflammatory drugs (NSAIDs) and supplements.  This website has more information on Eliquis (apixaban): http://www.eliquis.com/eliquis/home       Sabas Sous, MD 06/28/19 1104

## 2019-06-28 NOTE — ED Triage Notes (Signed)
Pt arrives pov with reports of shob and nasal congestion worsening today. Pt reports shob is mostly with exertion. Pt with hx chf. Speaking in complete sentences, in NAD.

## 2019-07-02 ENCOUNTER — Encounter (HOSPITAL_COMMUNITY): Payer: Self-pay | Admitting: Physician Assistant

## 2019-07-02 ENCOUNTER — Other Ambulatory Visit: Payer: Self-pay

## 2019-07-02 ENCOUNTER — Ambulatory Visit (HOSPITAL_COMMUNITY)
Admission: RE | Admit: 2019-07-02 | Discharge: 2019-07-02 | Disposition: A | Discharge status: Home / Self Care | Payer: Self-pay | Source: Ambulatory Visit | Attending: Physician Assistant | Admitting: Physician Assistant

## 2019-07-02 VITALS — BP 142/90 | HR 85 | Ht 73.0 in | Wt 309.2 lb

## 2019-07-02 DIAGNOSIS — I5042 Chronic combined systolic (congestive) and diastolic (congestive) heart failure: Secondary | ICD-10-CM | POA: Insufficient documentation

## 2019-07-02 DIAGNOSIS — F17210 Nicotine dependence, cigarettes, uncomplicated: Secondary | ICD-10-CM | POA: Insufficient documentation

## 2019-07-02 DIAGNOSIS — I480 Paroxysmal atrial fibrillation: Secondary | ICD-10-CM | POA: Insufficient documentation

## 2019-07-02 DIAGNOSIS — I130 Hypertensive heart and chronic kidney disease with heart failure and stage 1 through stage 4 chronic kidney disease, or unspecified chronic kidney disease: Secondary | ICD-10-CM | POA: Insufficient documentation

## 2019-07-02 DIAGNOSIS — Z79899 Other long term (current) drug therapy: Secondary | ICD-10-CM | POA: Insufficient documentation

## 2019-07-02 DIAGNOSIS — Z6841 Body Mass Index (BMI) 40.0 and over, adult: Secondary | ICD-10-CM | POA: Insufficient documentation

## 2019-07-02 DIAGNOSIS — E669 Obesity, unspecified: Secondary | ICD-10-CM | POA: Insufficient documentation

## 2019-07-02 DIAGNOSIS — D6869 Other thrombophilia: Secondary | ICD-10-CM | POA: Insufficient documentation

## 2019-07-02 DIAGNOSIS — Z7901 Long term (current) use of anticoagulants: Secondary | ICD-10-CM | POA: Insufficient documentation

## 2019-07-02 DIAGNOSIS — G4733 Obstructive sleep apnea (adult) (pediatric): Secondary | ICD-10-CM | POA: Insufficient documentation

## 2019-07-02 DIAGNOSIS — N183 Chronic kidney disease, stage 3 unspecified: Secondary | ICD-10-CM | POA: Insufficient documentation

## 2019-07-02 NOTE — Progress Notes (Signed)
TY

## 2019-07-02 NOTE — Progress Notes (Signed)
Primary Care Physician: Marcine Matar, MD Primary Cardiologist: Dr Royann Shivers Primary Electrophysiologist: none Referring Physician: [facility]   Zachary Hawkins is a 64 y.o. male with a history of HTN, CHF, OSA, tobacco abuse, CKD, and paroxysmal atrial fibrillation who presents for consultation in the [facility].  The patient was initially diagnosed with atrial fibrillation 06/28/19 after presenting to the ER with symptoms of worsening SOB. He was found to be in afib with pulse rates between 70s-110s. Patient was started on Eliquis for a CHADS2VASC score of 2. There were no specific triggers that the patient could identify. He is not on CPAP 2/2 cost of machine. Patient reports that his SOB has improved since leaving the hospital. He is in SR today. He denies significant alcohol use.   Today, he denies symptoms of palpitations, chest pain, shortness of breath, orthopnea, PND, lower extremity edema, dizziness, presyncope, syncope, snoring, daytime somnolence, bleeding, or neurologic sequela. The patient is tolerating medications without difficulties and is otherwise without complaint today.    Atrial Fibrillation Risk Factors:  he does have symptoms or diagnosis of sleep apnea. he is not compliant with CPAP therapy. he does not have a history of rheumatic fever. he does not have a history of alcohol use. The patient does have a history of early familial atrial fibrillation or other arrhythmias. Brother had afib.  he has a BMI of Body mass index is 40.79 kg/m.Marland Kitchen Filed Weights   07/02/19 1437  Weight: (!) 140.3 kg    Family History  Problem Relation Age of Onset  . Diabetes Mother   . Diabetes Father      Atrial Fibrillation Management history:  Previous antiarrhythmic drugs: none Previous cardioversions: none Previous ablations: none CHADS2VASC score: 2 Anticoagulation history: Eliquis   Past Medical History:  Diagnosis Date  .  Cardiomyopathy (HCC) 06/2017   EF 40-45%  . Chronic renal insufficiency, stage 3 (moderate)   . Diastolic dysfunction 06/2017   grade 2   . Hypertension   . Hypertensive urgency   . Obesity    No past surgical history on file.  Current Outpatient Medications  Medication Sig Dispense Refill  . albuterol (VENTOLIN HFA) 108 (90 Base) MCG/ACT inhaler Inhale 2 puffs into the lungs every 6 (six) hours as needed for wheezing or shortness of breath (Shortness of breath). 8 g 1  . amLODipine (NORVASC) 10 MG tablet TAKE 1 TABLET (10 MG TOTAL) BY MOUTH DAILY. 30 tablet 2  . apixaban (ELIQUIS) 5 MG TABS tablet Take 1 tablet (5 mg total) by mouth 2 (two) times daily. 60 tablet 0  . atorvastatin (LIPITOR) 10 MG tablet Take 1 tablet (10 mg total) by mouth daily. 90 tablet 3  . carvedilol (COREG) 3.125 MG tablet TAKE 1 TABLET (3.125 MG TOTAL) BY MOUTH 2 (TWO) TIMES DAILY WITH A MEAL. 60 tablet 3  . fluticasone (FLONASE) 50 MCG/ACT nasal spray Place 2 sprays into both nostrils daily. 16 g 6  . fluticasone-salmeterol (ADVAIR HFA) 115-21 MCG/ACT inhaler Inhale 2 puffs into the lungs 2 (two) times daily. 1 Inhaler 12  . furosemide (LASIX) 40 MG tablet TAKE 1 TABLET (40 MG TOTAL) BY MOUTH DAILY. 30 tablet 2  . hydrALAZINE (APRESOLINE) 50 MG tablet Take 1 tablet (50 mg total) by mouth 3 (three) times daily. 90 tablet 3  . isosorbide dinitrate (ISORDIL) 20 MG tablet Take 1 tablet (20 mg total) by mouth 3 (three) times daily. 90 tablet 3  .  sodium chloride (OCEAN) 0.65 % SOLN nasal spray Place 2 sprays into both nostrils 3 (three) times daily. 100 mL 0   No current facility-administered medications for this encounter.    Allergies  Allergen Reactions  . Ace Inhibitors Anaphylaxis  . Black Cherry Fruit Extract [Cherry Extract] Anaphylaxis    Tongue    Social History   Socioeconomic History  . Marital status: Single    Spouse name: Not on file  . Number of children: Not on file  . Years of education:  Not on file  . Highest education level: Not on file  Occupational History  . Not on file  Tobacco Use  . Smoking status: Current Every Day Smoker    Packs/day: 1.00    Types: Cigarettes  . Smokeless tobacco: Never Used  . Tobacco comment: 1 pack last 2-3 days  Substance and Sexual Activity  . Alcohol use: Yes    Alcohol/week: 1.0 standard drinks    Types: 1 Shots of liquor per week    Comment: socially  . Drug use: Yes    Types: Marijuana    Comment: every other week  . Sexual activity: Not on file  Other Topics Concern  . Not on file  Social History Narrative  . Not on file   Social Determinants of Health   Financial Resource Strain:   . Difficulty of Paying Living Expenses: Not on file  Food Insecurity:   . Worried About Programme researcher, broadcasting/film/video in the Last Year: Not on file  . Ran Out of Food in the Last Year: Not on file  Transportation Needs:   . Lack of Transportation (Medical): Not on file  . Lack of Transportation (Non-Medical): Not on file  Physical Activity:   . Days of Exercise per Week: Not on file  . Minutes of Exercise per Session: Not on file  Stress:   . Feeling of Stress : Not on file  Social Connections:   . Frequency of Communication with Friends and Family: Not on file  . Frequency of Social Gatherings with Friends and Family: Not on file  . Attends Religious Services: Not on file  . Active Member of Clubs or Organizations: Not on file  . Attends Banker Meetings: Not on file  . Marital Status: Not on file  Intimate Partner Violence:   . Fear of Current or Ex-Partner: Not on file  . Emotionally Abused: Not on file  . Physically Abused: Not on file  . Sexually Abused: Not on file     ROS- All systems are reviewed and negative except as per the HPI above.  Physical Exam: Vitals:   07/02/19 1437  BP: (!) 142/90  Pulse: 85  Weight: (!) 140.3 kg  Height: 6\' 1"  (1.854 m)    GEN- The patient is well appearing obese male, alert and  oriented x 3 today.   Head- normocephalic, atraumatic Eyes-  Sclera clear, conjunctiva pink Ears- hearing intact Oropharynx- clear Neck- supple  Lungs- Clear to ausculation bilaterally, normal work of breathing Heart- Regular rate and rhythm, no murmurs, rubs or gallops  GI- soft, NT, ND, + BS Extremities- no clubbing, cyanosis, or edema MS- no significant deformity or atrophy Skin- no rash or lesion Psych- euthymic mood, full affect Neuro- strength and sensation are intact  Wt Readings from Last 3 Encounters:  07/02/19 (!) 140.3 kg  06/28/19 131.5 kg  04/03/19 129.3 kg    EKG today demonstrates SR HR 85, NST, PR 168, QRS  92, QTc 437  Echo 06/06/17 demonstrated  Left ventricle: The cavity size was mildly dilated. There was  moderate concentric hypertrophy. Systolic function was mildly to  moderately reduced. The estimated ejection fraction was in the  range of 40% to 45%. Mild diffuse hypokinesis with no  identifiable regional variations. Features are consistent with a  pseudonormal left ventricular filling pattern, with concomitant  abnormal relaxation and increased filling pressure (grade 2  diastolic dysfunction).   Epic records are reviewed at length today  CHA2DS2-VASc Score = 2 The patient's score is based upon: CHF History: Yes HTN History: Yes Age : < 65 Diabetes History: No Stroke History: No Vascular Disease History: No Gender: Male      ASSESSMENT AND PLAN: 1. Paroxysmal Atrial Fibrillation (ICD10:  I48.0) The patient's CHA2DS2-VASc score is 2, indicating a 2.2% annual risk of stroke.   General education about afib provided and questions answered. We also discussed his stroke risk and the risks and benefits of anticoagulation. He is back in SR today.  Continue Eliquis 5 mg BID. Patient assistance paperwork given. Patient instructed to call us or community health if he does not have enough of the medication. May need to consider  warfarin. Continue Coreg 3.125 mg BID Lifestyle changes as below and including smoking cessation. His weight and untreated OSA will make maintaining SR difficult.   2. Secondary Hypercoagulable State (ICD10:  D68.69) The patient is at significant risk for stroke/thromboembolism based upon his CHA2DS2-VASc Score of 2.  Continue Apixaban (Eliquis).   3. Obesity Body mass index is 40.79 kg/m. Lifestyle modification was discussed at length including regular exercise and weight reduction. Patient to start eating a more plant based diet and walking regularly.   4. Obstructive sleep apnea The importance of adequate treatment of sleep apnea was discussed today in order to improve our ability to maintain sinus rhythm long term. Patient is working on getting CPAP machine.  5. HTN Borderline, no changes today.  6. 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 2 g sodium diet   Follow up in the AF clinic in 6 weeks.    [facility] [address] [place], [place] [address] [phone] 07/02/2019 3:46 PM

## 2019-07-03 ENCOUNTER — Telehealth (HOSPITAL_COMMUNITY): Payer: Self-pay | Admitting: *Deleted

## 2019-07-03 NOTE — Telephone Encounter (Signed)
Patient approved for eliquis patient assistance through 07/02/19. Case #[id number]

## 2019-07-26 ENCOUNTER — Encounter (HOSPITAL_COMMUNITY): Payer: Self-pay

## 2019-07-26 ENCOUNTER — Emergency Department (HOSPITAL_COMMUNITY)
Admission: EM | Admit: 2019-07-26 | Discharge: 2019-07-26 | Disposition: A | Discharge status: Home / Self Care | Payer: Self-pay | Attending: Emergency Medicine | Admitting: Emergency Medicine

## 2019-07-26 ENCOUNTER — Other Ambulatory Visit: Payer: Self-pay

## 2019-07-26 ENCOUNTER — Emergency Department (HOSPITAL_COMMUNITY): Payer: Self-pay

## 2019-07-26 DIAGNOSIS — F17210 Nicotine dependence, cigarettes, uncomplicated: Secondary | ICD-10-CM | POA: Insufficient documentation

## 2019-07-26 DIAGNOSIS — Z79899 Other long term (current) drug therapy: Secondary | ICD-10-CM | POA: Insufficient documentation

## 2019-07-26 DIAGNOSIS — N183 Chronic kidney disease, stage 3 unspecified: Secondary | ICD-10-CM | POA: Insufficient documentation

## 2019-07-26 DIAGNOSIS — I130 Hypertensive heart and chronic kidney disease with heart failure and stage 1 through stage 4 chronic kidney disease, or unspecified chronic kidney disease: Secondary | ICD-10-CM | POA: Insufficient documentation

## 2019-07-26 DIAGNOSIS — I509 Heart failure, unspecified: Secondary | ICD-10-CM

## 2019-07-26 DIAGNOSIS — I5042 Chronic combined systolic (congestive) and diastolic (congestive) heart failure: Secondary | ICD-10-CM | POA: Insufficient documentation

## 2019-07-26 LAB — BASIC METABOLIC PANEL
Anion gap: 10 (ref 5–15)
BUN: 25 mg/dL — ABNORMAL HIGH (ref 8–23)
CO2: 24 mmol/L (ref 22–32)
Calcium: 8.9 mg/dL (ref 8.9–10.3)
Chloride: 104 mmol/L (ref 98–111)
Creatinine, Ser: 1.57 mg/dL — ABNORMAL HIGH (ref 0.61–1.24)
GFR calc Af Amer: 54 mL/min — ABNORMAL LOW (ref >60)
GFR calc non Af Amer: 47 mL/min — ABNORMAL LOW (ref >60)
Glucose, Bld: 123 mg/dL — ABNORMAL HIGH (ref 70–99)
Potassium: 4.5 mmol/L (ref 3.5–5.1)
Sodium: 138 mmol/L (ref 135–145)

## 2019-07-26 LAB — CBC
HCT: 50.8 % (ref 39.0–52.0)
Hemoglobin: 15.8 g/dL (ref 13.0–17.0)
MCH: 29 pg (ref 26.0–34.0)
MCHC: 31.1 g/dL (ref 30.0–36.0)
MCV: 93.4 fL (ref 80.0–100.0)
Platelets: 216 10*3/uL (ref 150–400)
RBC: 5.44 MIL/uL (ref 4.22–5.81)
RDW: 14.8 % (ref 11.5–15.5)
WBC: 6.5 10*3/uL (ref 4.0–10.5)
nRBC: 0 % (ref 0.0–0.2)

## 2019-07-26 LAB — BRAIN NATRIURETIC PEPTIDE: B Natriuretic Peptide: 28.6 pg/mL (ref 0.0–100.0)

## 2019-07-26 LAB — TROPONIN I (HIGH SENSITIVITY)
Troponin I (High Sensitivity): 14 ng/L (ref <18)
Troponin I (High Sensitivity): 14 ng/L (ref <18)

## 2019-07-26 MED ORDER — AMLODIPINE BESYLATE 5 MG PO TABS
10.0000 mg | ORAL_TABLET | Freq: Once | ORAL | Status: AC
  Administered 2019-07-26: 10 mg via ORAL
  Filled 2019-07-26: qty 2

## 2019-07-26 MED ORDER — FUROSEMIDE 20 MG PO TABS
40.0000 mg | ORAL_TABLET | Freq: Once | ORAL | Status: AC
  Administered 2019-07-26: 40 mg via ORAL
  Filled 2019-07-26: qty 2

## 2019-07-26 MED FILL — ?FUROSEMIDE 40 MG TABLET: 40 | 30 days supply | Qty: 30 | Fill #2

## 2019-07-26 MED FILL — AMLODIPINE BESYLATE 10 MG T: 10 | 30 days supply | Qty: 30 | Fill #2

## 2019-07-26 NOTE — Discharge Instructions (Addendum)
Take your lasix as prescribed   See your doctor and cardiologist   Return to ER if you have worse shortness of breath, leg swelling

## 2019-07-26 NOTE — ED Provider Notes (Signed)
[facility] EMERGENCY DEPARTMENT Provider Note  CSN: [id number] Arrival date & time: 07/26/19 0507  Chief Complaint(s) Shortness of Breath  HPI Zachary Hawkins is a 64 y.o. male   The history is provided by the patient.  Shortness of Breath Severity:  Moderate Onset quality:  Gradual Duration:  4 hours Timing:  Constant Progression:  Unchanged Chronicity:  Recurrent Relieved by:  Rest Worsened by:  Exertion Associated symptoms: no chest pain, no cough and no sputum production    H/o CHF. Ran out of lasix and amlodipine 2 days ago.  Past Medical History Past Medical History:  Diagnosis Date  . Cardiomyopathy (HCC) 06/2017   EF 40-45%  . Chronic renal insufficiency, stage 3 (moderate)   . Diastolic dysfunction 06/2017   grade 2   . Hypertension   . Hypertensive urgency   . Obesity    Patient Active Problem List   Diagnosis Date Noted  . Paroxysmal atrial fibrillation (HCC) 07/02/2019  . Secondary hypercoagulable state (HCC) 07/02/2019  . OSA (obstructive sleep apnea) 04/03/2019  . 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 Non compliance w medication regimen 07/13/2017  . Systolic and diastolic CHF, chronic (HCC) 06/23/2017  . Uncontrolled hypertension 06/23/2017  . Obesity with serious comorbidity   . CKD (chronic kidney disease) stage 3, GFR 30-59 ml/min   . Dilated cardiomyopathy (HCC) 06/06/2017  . Dyspnea 06/05/2017   Home Medication(s) Prior to Admission medications   Medication Sig Start Date End Date Taking? Authorizing Provider  albuterol (VENTOLIN HFA) 108 (90 Base) MCG/ACT inhaler Inhale 2 puffs into the lungs every 6 (six) hours as needed for wheezing or shortness of breath (Shortness of breath). 04/18/19   Storm Frisk, MD  amLODipine (NORVASC) 10 MG tablet TAKE 1 TABLET (10 MG TOTAL) BY MOUTH DAILY. 04/20/19   Marcine Matar, MD  apixaban (ELIQUIS) 5 MG TABS tablet Take 1 tablet (5 mg total) by mouth 2 (two)  times daily. 06/28/19 07/28/19  Sabas Sous, MD  atorvastatin (LIPITOR) 10 MG tablet Take 1 tablet (10 mg total) by mouth daily. 10/13/18   Marcine Matar, MD  carvedilol (COREG) 3.125 MG tablet TAKE 1 TABLET (3.125 MG TOTAL) BY MOUTH 2 (TWO) TIMES DAILY WITH A MEAL. 06/21/19   Marcine Matar, MD  fluticasone (FLONASE) 50 MCG/ACT nasal spray Place 2 sprays into both nostrils daily. 04/18/19   Storm Frisk, MD  fluticasone-salmeterol (ADVAIR HFA) [phone] MCG/ACT inhaler Inhale 2 puffs into the lungs 2 (two) times daily. 04/18/19   Storm Frisk, MD  furosemide (LASIX) 40 MG tablet TAKE 1 TABLET (40 MG TOTAL) BY MOUTH DAILY. 04/20/19   Marcine Matar, MD  hydrALAZINE (APRESOLINE) 50 MG tablet Take 1 tablet (50 mg total) by mouth 3 (three) times daily. 10/06/18   Marcine Matar, MD  isosorbide dinitrate (ISORDIL) 20 MG tablet Take 1 tablet (20 mg total) by mouth 3 (three) times daily. 10/06/18   Marcine Matar, MD  sodium chloride (OCEAN) 0.65 % SOLN nasal spray Place 2 sprays into both nostrils 3 (three) times daily. 04/18/19   Storm Frisk, MD  Past Surgical History History reviewed. No pertinent surgical history. Family History Family History  Problem Relation Age of Onset  . Diabetes Mother   . Diabetes Father     Social History Social History   Tobacco Use  . Smoking status: Current Every Day Smoker    Packs/day: 1.00    Types: Cigarettes  . Smokeless tobacco: Never Used  . Tobacco comment: 1 pack last 2-3 days  Substance Use Topics  . Alcohol use: Yes    Alcohol/week: 1.0 standard drinks    Types: 1 Shots of liquor per week    Comment: socially  . Drug use: Yes    Types: Marijuana    Comment: every other week   Allergies Ace inhibitors and Black cherry fruit extract [cherry extract]  Review of Systems Review of Systems   Respiratory: Positive for shortness of breath. Negative for cough and sputum production.   Cardiovascular: Negative for chest pain.   All other systems are reviewed and are negative for acute change except as noted in the HPI  Physical Exam Vital Signs  I have reviewed the triage vital signs BP (!) 151/92   Pulse 76   Temp 97.9 F (36.6 C)   Resp 15   SpO2 93%   Physical Exam Vitals reviewed.  Constitutional:      General: He is not in acute distress.    Appearance: He is well-developed. He is not diaphoretic.  HENT:     Head: Normocephalic and atraumatic.     Nose: Nose normal.  Eyes:     General: No scleral icterus.       Right eye: No discharge.        Left eye: No discharge.     Conjunctiva/sclera: Conjunctivae normal.     Pupils: Pupils are equal, round, and reactive to light.  Cardiovascular:     Rate and Rhythm: Normal rate and regular rhythm.     Heart sounds: No murmur. No friction rub. No gallop.   Pulmonary:     Effort: Pulmonary effort is normal. No respiratory distress.     Breath sounds: Normal breath sounds. No stridor. No rales.  Abdominal:     General: There is no distension.     Palpations: Abdomen is soft.     Tenderness: There is no abdominal tenderness.  Musculoskeletal:        General: No tenderness.     Cervical back: Normal range of motion and neck supple.     Right lower leg: 1+ Pitting Edema present.     Left lower leg: 1+ Pitting Edema present.  Skin:    General: Skin is warm and dry.     Findings: No erythema or rash.  Neurological:     Mental Status: He is alert and oriented to person, place, and time.     ED Results and Treatments Labs (all labs ordered are listed, but only abnormal results are displayed) Labs Reviewed  CBC  BASIC METABOLIC PANEL  BRAIN NATRIURETIC PEPTIDE  TROPONIN I (HIGH SENSITIVITY)  EKG  EKG Interpretation  Date/Time:  Thursday July 26 2019 05:12:30 EDT Ventricular Rate:  75 PR Interval:  184 QRS Duration: 92 QT Interval:  360 QTC Calculation: 402 R Axis:   29 Text Interpretation: Normal sinus rhythm with sinus arrhythmia Nonspecific T wave abnormality Abnormal ECG No significant change since last tracing Confirmed by Drema Pry [phone]) on 07/26/2019 5:23:04 AM      Radiology DG Chest 2 View  Result Date: 07/26/2019 CLINICAL DATA:  Shortness of breath EXAM: CHEST - 2 VIEW COMPARISON:  06/28/2019 FINDINGS: Borderline cardiomegaly with aortic tortuosity. Generalized interstitial coarsening without Kerley lines or pleural effusion. No pneumothorax. IMPRESSION: No evidence of active disease Electronically Signed   By: Marnee Spring M.D.   On: 07/26/2019 05:51    Pertinent labs & imaging results that were available during my care of the patient were reviewed by me and considered in my medical decision making (see chart for details).  Medications Ordered in ED Medications  amLODipine (NORVASC) tablet 10 mg (has no administration in time range)  furosemide (LASIX) tablet 40 mg (has no administration in time range)                                                                                                                                    Procedures Procedures  (including critical care time)  Medical Decision Making / ED Course I have reviewed the nursing notes for this encounter and the patient's prior records (if available in EHR or on provided paperwork).   Zachary Hawkins was evaluated in Emergency Department on 07/26/2019 for the symptoms described in the history of present illness. He was evaluated in the context of the global COVID-19 pandemic, which necessitated consideration that the patient might be at risk for infection with the SARS-CoV-2 virus that causes COVID-19. Institutional protocols and algorithms that pertain to the evaluation of  patients at risk for COVID-19 are in a state of rapid change based on information released by regulatory bodies including the CDC and federal and state organizations. These policies and algorithms were followed during the patient's care in the ED.  Patient presents with shortness of breath and chest congestion.  No fevers or chills.  Dry cough.  History of heart failure who ran out of his Lasix.  Mild peripheral edema.  No respiratory distress.  Lungs clear to auscultation bilaterally.  Chest x-ray without evidence suggestive of pneumonia, pneumothorax, pneumomediastinum.  No abnormal contour of the mediastinum to suggest dissection. No evidence of acute injuries.  EKG without acute ischemic changes or evidence of pericarditis.  Troponin pending.  BNP pending. CBC without anemia.  No significant electrolyte derangements. stable renal sufficiency.  Patient provided with home dose of amlodipine and Lasix.  Patient care turned over to Dr Silverio Lay. Patient case and results discussed in detail; please see their note for further ED managment.  This chart was dictated using voice recognition software.  Despite best efforts to proofread,  errors can occur which can change the documentation meaning.   Nira Conn, MD 07/26/19 [phone]

## 2019-07-26 NOTE — ED Notes (Signed)
Pt taken to xray in NAD 

## 2019-07-26 NOTE — ED Provider Notes (Signed)
  Physical Exam  BP 121/73 (BP Location: Left Arm)   Pulse 71   Temp 97.9 F (36.6 C)   Resp 20   Ht 6\' 1"  (1.854 m)   Wt 132 kg   SpO2 98%   BMI 38.39 kg/m   Physical Exam  ED Course/Procedures     Procedures  MDM  Care assumed at 7 am.  Patient is here with shortness of breath and leg swelling for he ran out of her Lasix and amlodipine 2 days ago because he did not have the money to refill it.  Signout pending labs and reassessment after Lasix.  9:31 AM Cr at baseline. BNP normal. Trop normal.  Urinate about 2 L after given Lasix.  [facility] He states that he is able to go there and pick up his refill.  Stable for discharge.      , MD 07/26/19 [phone]

## 2019-07-26 NOTE — ED Notes (Signed)
meds given per MAR. Name/DOB verified with pt 

## 2019-07-26 NOTE — ED Notes (Signed)
Patient verbalizes understanding of discharge instructions. Opportunity for questioning and answers were provided. Armband removed by staff, pt discharged from ED. Wheeled out to lobby  

## 2019-07-26 NOTE — ED Triage Notes (Signed)
Pt reports that he has been having congestion and some SOB for the past hour.

## 2019-07-30 ENCOUNTER — Other Ambulatory Visit: Payer: Self-pay | Admitting: Internal Medicine

## 2019-07-30 DIAGNOSIS — I10 Essential (primary) hypertension: Secondary | ICD-10-CM

## 2019-07-30 MED FILL — ?CARVEDILOL 3.125 MG TABLET: 3.125 | 30 days supply | Qty: 60 | Fill #1

## 2019-07-30 MED FILL — ISOSORBIDE DN 20 MG TABLET: 20 | 30 days supply | Qty: 90 | Fill #3

## 2019-07-31 MED FILL — hydrALAZINE HCL 50 MG TABS: 50 | 30 days supply | Qty: 90 | Fill #0

## 2019-08-02 ENCOUNTER — Emergency Department (HOSPITAL_COMMUNITY)
Admission: EM | Admit: 2019-08-02 | Discharge: 2019-08-02 | Disposition: A | Discharge status: Home / Self Care | Payer: Self-pay | Attending: Emergency Medicine | Admitting: Emergency Medicine

## 2019-08-02 ENCOUNTER — Telehealth: Payer: Self-pay

## 2019-08-02 ENCOUNTER — Other Ambulatory Visit: Payer: Self-pay

## 2019-08-02 ENCOUNTER — Emergency Department (HOSPITAL_COMMUNITY): Payer: Self-pay

## 2019-08-02 DIAGNOSIS — Z79899 Other long term (current) drug therapy: Secondary | ICD-10-CM | POA: Insufficient documentation

## 2019-08-02 DIAGNOSIS — I5040 Unspecified combined systolic (congestive) and diastolic (congestive) heart failure: Secondary | ICD-10-CM | POA: Insufficient documentation

## 2019-08-02 DIAGNOSIS — Z7901 Long term (current) use of anticoagulants: Secondary | ICD-10-CM | POA: Insufficient documentation

## 2019-08-02 DIAGNOSIS — I4891 Unspecified atrial fibrillation: Secondary | ICD-10-CM | POA: Insufficient documentation

## 2019-08-02 DIAGNOSIS — N183 Chronic kidney disease, stage 3 unspecified: Secondary | ICD-10-CM | POA: Insufficient documentation

## 2019-08-02 DIAGNOSIS — I130 Hypertensive heart and chronic kidney disease with heart failure and stage 1 through stage 4 chronic kidney disease, or unspecified chronic kidney disease: Secondary | ICD-10-CM | POA: Insufficient documentation

## 2019-08-02 DIAGNOSIS — F17210 Nicotine dependence, cigarettes, uncomplicated: Secondary | ICD-10-CM | POA: Insufficient documentation

## 2019-08-02 DIAGNOSIS — F1210 Cannabis abuse, uncomplicated: Secondary | ICD-10-CM | POA: Insufficient documentation

## 2019-08-02 DIAGNOSIS — R0602 Shortness of breath: Secondary | ICD-10-CM | POA: Insufficient documentation

## 2019-08-02 LAB — BASIC METABOLIC PANEL
Anion gap: 9 (ref 5–15)
BUN: 27 mg/dL — ABNORMAL HIGH (ref 8–23)
CO2: 25 mmol/L (ref 22–32)
Calcium: 9 mg/dL (ref 8.9–10.3)
Chloride: 104 mmol/L (ref 98–111)
Creatinine, Ser: 1.54 mg/dL — ABNORMAL HIGH (ref 0.61–1.24)
GFR calc Af Amer: 56 mL/min — ABNORMAL LOW (ref >60)
GFR calc non Af Amer: 48 mL/min — ABNORMAL LOW (ref >60)
Glucose, Bld: 109 mg/dL — ABNORMAL HIGH (ref 70–99)
Potassium: 4.3 mmol/L (ref 3.5–5.1)
Sodium: 138 mmol/L (ref 135–145)

## 2019-08-02 LAB — CBC
HCT: 50.6 % (ref 39.0–52.0)
Hemoglobin: 16 g/dL (ref 13.0–17.0)
MCH: 29.6 pg (ref 26.0–34.0)
MCHC: 31.6 g/dL (ref 30.0–36.0)
MCV: 93.7 fL (ref 80.0–100.0)
Platelets: 257 10*3/uL (ref 150–400)
RBC: 5.4 MIL/uL (ref 4.22–5.81)
RDW: 14.7 % (ref 11.5–15.5)
WBC: 6.3 10*3/uL (ref 4.0–10.5)
nRBC: 0 % (ref 0.0–0.2)

## 2019-08-02 LAB — PROTIME-INR
INR: 1.1 (ref 0.8–1.2)
Prothrombin Time: 13.7 seconds (ref 11.4–15.2)

## 2019-08-02 LAB — BRAIN NATRIURETIC PEPTIDE: B Natriuretic Peptide: 27.2 pg/mL (ref 0.0–100.0)

## 2019-08-02 MED ORDER — SODIUM CHLORIDE 0.9% FLUSH: 3.0000 mL | Freq: Once | INTRAVENOUS | Status: DC

## 2019-08-02 NOTE — Telephone Encounter (Signed)
As per [facility], the patient's order for CPAP has been voiced. He informed them that he could not afford the machine and he never completed the hardship application.  Attempted to contact the patient # [phone], message left with call back requested to this CM to discuss.  He will need to contact [facility] [employer] # [phone] and speak to Lauretta Chester  if he would like to pursue obtaining the CPAP machine and complete the hardship application process to determine eligibility for assistance with the cost of the machine. A new order will need to be submitted.

## 2019-08-02 NOTE — ED Provider Notes (Signed)
[facility] EMERGENCY DEPARTMENT Provider Note   CSN: [id number] Arrival date & time: 08/02/19  6226     History Chief Complaint  Patient presents with  . Shortness of Breath    Zachary Hawkins is a 64 y.o. male with PMH significant for HTN, HLD, stage III CKD, obesity, and congestive heart failure presents to the ED with complaints of chronic shortness of breath.  Patient reports that he has been experiencing chronic shortness of breath symptoms to the point that he has difficulty tying his shoes without becoming short of breath.  This has persisted over the course of the past few months and he has been evaluated for similar complaints as recently as last week.  He also reports that at night he is exhausted during the day because he cannot get sleep at night due to his snoring and difficulty breathing.  He has been evaluated with a sleep study and has been prescribed a CPAP machine, however reports that he has had difficulty affording it.  He has a follow-up with his primary care provider, Dr. Laural Benes, on 08/15/2019.  He has all his medications, he was simply wondering if possible would be able to help him with his financial constraints at this time.  Patient is also endorsing nasal congestion which she thinks may be contributing to his symptoms.  He denies any recent illness, chest pain, fevers or chills, leg swelling, pleuritic chest discomfort or palpitations, dizziness, headache, sore throat, ear pain, or other symptoms.  HPI     Past Medical History:  Diagnosis Date  . Cardiomyopathy (HCC) 06/2017   EF 40-45%  . Chronic renal insufficiency, stage 3 (moderate)   . Diastolic dysfunction 06/2017   grade 2   . Hypertension   . Hypertensive urgency   . Obesity     Patient Active Problem List   Diagnosis Date Noted  . Paroxysmal atrial fibrillation (HCC) 07/02/2019  . Secondary hypercoagulable state (HCC) 07/02/2019  . OSA (obstructive sleep apnea) 04/03/2019  .  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 Non compliance w medication regimen 07/13/2017  . Systolic and diastolic CHF, chronic (HCC) 06/23/2017  . Uncontrolled hypertension 06/23/2017  . Obesity with serious comorbidity   . CKD (chronic kidney disease) stage 3, GFR 30-59 ml/min   . Dilated cardiomyopathy (HCC) 06/06/2017  . Dyspnea 06/05/2017    No past surgical history on file.     Family History  Problem Relation Age of Onset  . Diabetes Mother   . Diabetes Father     Social History   Tobacco Use  . Smoking status: Current Every Day Smoker    Packs/day: 1.00    Types: Cigarettes  . Smokeless tobacco: Never Used  . Tobacco comment: 1 pack last 2-3 days  Substance Use Topics  . Alcohol use: Yes    Alcohol/week: 1.0 standard drinks    Types: 1 Shots of liquor per week    Comment: socially  . Drug use: Yes    Types: Marijuana    Comment: every other week    Home Medications Prior to Admission medications   Medication Sig Start Date End Date Taking? Authorizing Provider  albuterol (VENTOLIN HFA) 108 (90 Base) MCG/ACT inhaler Inhale 2 puffs into the lungs every 6 (six) hours as needed for wheezing or shortness of breath (Shortness of breath). 04/18/19   Storm Frisk, MD  amLODipine (NORVASC) 10 MG tablet TAKE 1 TABLET (10 MG TOTAL) BY MOUTH DAILY. 04/20/19  Marcine Matar, MD  apixaban (ELIQUIS) 5 MG TABS tablet Take 1 tablet (5 mg total) by mouth 2 (two) times daily. 06/28/19 07/28/19  Sabas Sous, MD  atorvastatin (LIPITOR) 10 MG tablet Take 1 tablet (10 mg total) by mouth daily. Patient not taking: Reported on 07/26/2019 10/13/18   Marcine Matar, MD  carvedilol (COREG) 3.125 MG tablet TAKE 1 TABLET (3.125 MG TOTAL) BY MOUTH 2 (TWO) TIMES DAILY WITH A MEAL. 06/21/19   Marcine Matar, MD  fluticasone (FLONASE) 50 MCG/ACT nasal spray Place 2 sprays into both nostrils daily. 04/18/19   Storm Frisk, MD  fluticasone-salmeterol (ADVAIR  HFA) [phone] MCG/ACT inhaler Inhale 2 puffs into the lungs 2 (two) times daily. 04/18/19   Storm Frisk, MD  furosemide (LASIX) 40 MG tablet TAKE 1 TABLET (40 MG TOTAL) BY MOUTH DAILY. 04/20/19   Marcine Matar, MD  hydrALAZINE (APRESOLINE) 50 MG tablet TAKE 1 TABLET (50 MG TOTAL) BY MOUTH 3 (THREE) TIMES DAILY. 07/31/19   Marcine Matar, MD  isosorbide dinitrate (ISORDIL) 20 MG tablet Take 1 tablet (20 mg total) by mouth 3 (three) times daily. 10/06/18   Marcine Matar, MD  sodium chloride (OCEAN) 0.65 % SOLN nasal spray Place 2 sprays into both nostrils 3 (three) times daily. Patient not taking: Reported on 07/26/2019 04/18/19   Storm Frisk, MD    Allergies    Ace inhibitors and Black cherry fruit extract [name]]  Review of Systems   Review of Systems  Constitutional: Negative for fever.  Respiratory: Positive for shortness of breath. Negative for cough.   Cardiovascular: Negative for chest pain.  Neurological: Negative for dizziness.    Physical Exam Updated Vital Signs BP (!) 125/112 (BP Location: Right Arm)   Pulse (!) 50   Temp 97.8 F (36.6 C) (Oral)   Resp 20   Ht 6\' 1"  (1.854 m)   Wt 131.5 kg   SpO2 99%   BMI 38.26 kg/m   Physical Exam Vitals and nursing note reviewed. Exam conducted with a chaperone present.  Constitutional:      Appearance: Normal appearance.  HENT:     Head: Normocephalic and atraumatic.     Nose: Congestion present.     Mouth/Throat:     Pharynx: Oropharynx is clear.  Eyes:     General: No scleral icterus.    Conjunctiva/sclera: Conjunctivae normal.  Cardiovascular:     Rate and Rhythm: Normal rate and regular rhythm.     Pulses: Normal pulses.     Heart sounds: Normal heart sounds.  Pulmonary:     Effort: Pulmonary effort is normal. No respiratory distress.     Breath sounds: Normal breath sounds. No wheezing or rales.  Musculoskeletal:     Cervical back: Normal range of motion.  Skin:    General: Skin is  dry.     Capillary Refill: Capillary refill takes less than 2 seconds.  Neurological:     Mental Status: He is alert and oriented to person, place, and time.     GCS: GCS eye subscore is 4. GCS verbal subscore is 5. GCS motor subscore is 6.  Psychiatric:        Mood and Affect: Mood normal.        Behavior: Behavior normal.        Thought Content: Thought content normal.      ED Results / Procedures / Treatments   Labs (all labs ordered are listed, but only  abnormal results are displayed) Labs Reviewed  BASIC METABOLIC PANEL - Abnormal; Notable for the following components:      Result Value   Glucose, Bld 109 (*)    BUN 27 (*)    Creatinine, Ser 1.54 (*)    GFR calc non Af Amer 48 (*)    GFR calc Af Amer 56 (*)    All other components within normal limits  CBC  PROTIME-INR  BRAIN NATRIURETIC PEPTIDE    EKG None  Radiology DG Chest 2 View  Result Date: 08/02/2019 CLINICAL DATA:  Shortness of breath EXAM: CHEST - 2 VIEW COMPARISON:  07/26/2019 FINDINGS: Normal heart size and mild aortic tortuosity. Improved lung volumes with stable interstitial prominence. There is no edema, consolidation, effusion, or pneumothorax. Spondylosis IMPRESSION: No evidence of acute disease. Electronically Signed   By: Marnee Spring M.D.   On: 08/02/2019 07:43    Procedures Procedures (including critical care time)  Medications Ordered in ED Medications  sodium chloride flush (NS) 0.9 % injection 3 mL (3 mLs Intravenous Not Given 08/02/19 1121)    ED Course  I have reviewed the triage vital signs and the nursing notes.  Pertinent labs & imaging results that were available during my care of the patient were reviewed by me and considered in my medical decision making (see chart for details).    MDM Rules/Calculators/A&P                      On exam, patient is sitting up comfortably and is in no acute respiratory distress.  He endorses mild nasal congestion in addition to his chronic  complaints of OSA, daytime drowsiness, and exertional dyspnea.  EKG obtained demonstrates no significant changes from prior tracings.  I reviewed plain films obtained of chest which demonstrates no acute cardiopulmonary findings.  His renal function is at baseline and the rest of his lab work is entirely reassuring.  No evidence to suggest fluid overload status.  He is taking his medications, as prescribed.  He states that he was simply here to inquire as to whether or not the hospital could assist with some financial constraints.  Spoke with Dr. Silverio Lay and we suspect that his primary care provider at the wellness center already has a process in place for obtaining him a CPAP machine at more affordable rate.  I reviewed patient's medical record and he is already followed by cardiology.  Encouraged him to follow-up with his cardiologist regarding his continued DOE.  As for his reported nasal congestion, recommending Flonase, nasal rinses, and antihistamines.  We also discussed his 10-pack-year smoking history.  The patient was counseled on the dangers of tobacco use, and was advised to quit.  Reviewed strategies to maximize success, including removing cigarettes and smoking materials from environment, stress management, substitution of other forms of reinforcement, support of family/friends and written materials. Total time was 5 min CPT code 40973.   Strict ED return precautions discussed.  [facility] plans [facility] to [facility] up his other HTN medication.  All of the evaluation and work-up results were discussed with the patient and any family at bedside. They were provided opportunity to ask any additional questions and have none at this time. They have expressed understanding of verbal discharge instructions as well as return precautions and are agreeable to the plan.    Final Clinical Impression(s) / ED Diagnoses Final diagnoses:  Shortness of breath    Rx / DC Orders  ED  Discharge Orders    None       Reita Chard 08/02/19 1144    Drenda Freeze, MD 08/02/19 [phone]

## 2019-08-02 NOTE — ED Triage Notes (Signed)
C/o shortness of breath- "I just can't get a deep breath" pt is able to talk in complete sentences,

## 2019-08-02 NOTE — Discharge Instructions (Addendum)
Please follow-up with your primary care provider as well as your cardiologist regarding today's encounter.  Please talk to your PCP about how to get your CPAP for a more affordable rate once you have your Medicaid approved.  I strongly encourage you to discontinue smoking as that is contributing to your shortness of breath symptoms.  As for your reported nasal congestion, I recommend a combination of Flonase, nasal rinses, and over-the-counter antihistamines such as Zyrtec.  Please read the attachment on sinus rinses.  Return to the ED or seek immediate medical attention for any new or worsening symptoms.

## 2019-08-15 ENCOUNTER — Ambulatory Visit: Payer: Self-pay

## 2019-08-23 ENCOUNTER — Telehealth: Payer: Self-pay

## 2019-08-23 NOTE — Telephone Encounter (Signed)
Call placed to patient regarding CPAP. He said that he could not afford the machine. Explained to him that he did not complete the [facility] hardship application.  Provided him with the contact # for [facility] office # [phone] and encouraged him to call and complete the hardship application.  This CM also explained that there is no guarantee that he will be approved for the program but he should apply.  There are no other options for assistance with obtaining a CPAP machine at this time.   He said that he understood and will call [facility].  He also noted that he is working on applying for [facility] [employer].

## 2019-08-27 ENCOUNTER — Other Ambulatory Visit: Payer: Self-pay | Admitting: Internal Medicine

## 2019-08-27 DIAGNOSIS — I5042 Chronic combined systolic (congestive) and diastolic (congestive) heart failure: Secondary | ICD-10-CM

## 2019-08-27 DIAGNOSIS — I10 Essential (primary) hypertension: Secondary | ICD-10-CM

## 2019-08-27 MED FILL — ?CARVEDILOL 3.125 MG TABLET: 3.125 | 30 days supply | Qty: 60 | Fill #2

## 2019-08-27 MED FILL — hydrALAZINE HCL 50 MG TABS: 50 | 30 days supply | Qty: 90 | Fill #1

## 2019-08-28 ENCOUNTER — Ambulatory Visit (HOSPITAL_COMMUNITY)
Admission: RE | Admit: 2019-08-28 | Discharge: 2019-08-28 | Disposition: A | Discharge status: Home / Self Care | Payer: Self-pay | Source: Ambulatory Visit | Attending: Physician Assistant | Admitting: Physician Assistant

## 2019-08-28 ENCOUNTER — Other Ambulatory Visit: Payer: Self-pay

## 2019-08-28 ENCOUNTER — Encounter (HOSPITAL_COMMUNITY): Payer: Self-pay | Admitting: Physician Assistant

## 2019-08-28 VITALS — BP 142/92 | HR 93 | Ht 73.0 in | Wt 309.4 lb

## 2019-08-28 DIAGNOSIS — I130 Hypertensive heart and chronic kidney disease with heart failure and stage 1 through stage 4 chronic kidney disease, or unspecified chronic kidney disease: Secondary | ICD-10-CM | POA: Insufficient documentation

## 2019-08-28 DIAGNOSIS — N183 Chronic kidney disease, stage 3 unspecified: Secondary | ICD-10-CM | POA: Insufficient documentation

## 2019-08-28 DIAGNOSIS — Z7901 Long term (current) use of anticoagulants: Secondary | ICD-10-CM | POA: Insufficient documentation

## 2019-08-28 DIAGNOSIS — I5042 Chronic combined systolic (congestive) and diastolic (congestive) heart failure: Secondary | ICD-10-CM | POA: Insufficient documentation

## 2019-08-28 DIAGNOSIS — D6869 Other thrombophilia: Secondary | ICD-10-CM

## 2019-08-28 DIAGNOSIS — I480 Paroxysmal atrial fibrillation: Secondary | ICD-10-CM | POA: Insufficient documentation

## 2019-08-28 DIAGNOSIS — G4733 Obstructive sleep apnea (adult) (pediatric): Secondary | ICD-10-CM | POA: Insufficient documentation

## 2019-08-28 DIAGNOSIS — F17210 Nicotine dependence, cigarettes, uncomplicated: Secondary | ICD-10-CM | POA: Insufficient documentation

## 2019-08-28 DIAGNOSIS — E669 Obesity, unspecified: Secondary | ICD-10-CM | POA: Insufficient documentation

## 2019-08-28 DIAGNOSIS — Z6841 Body Mass Index (BMI) 40.0 and over, adult: Secondary | ICD-10-CM | POA: Insufficient documentation

## 2019-08-28 DIAGNOSIS — Z79899 Other long term (current) drug therapy: Secondary | ICD-10-CM | POA: Insufficient documentation

## 2019-08-28 NOTE — Progress Notes (Signed)
Primary Care Physician: Marcine Matar, MD Primary Cardiologist: Dr Royann Shivers Primary Electrophysiologist: none Referring Physician: [facility]   Zachary Hawkins is a 64 y.o. male with a history of HTN, CHF, OSA, tobacco abuse, CKD, and paroxysmal atrial fibrillation who presents for follow up in the [facility].  The patient was initially diagnosed with atrial fibrillation 06/28/19 after presenting to the ER with symptoms of worsening SOB. He was found to be in afib with pulse rates between 70s-110s. Patient was started on Eliquis for a CHADS2VASC score of 2. There were no specific triggers that the patient could identify. He is not on CPAP 2/2 cost of machine. He denies significant alcohol use.   On follow up today, patient reports he has done well since his last visit. His breathing has been stable and he has had no heart racing or palpitations. He has been compliant with anticoagulation and denies any bleeding issues. He has been out of his Lasix and Coreg for several days 2/2 cost concerns. He has an appointment with [facility] wellness for financial counseling.   Today, he denies symptoms of palpitations, chest pain, shortness of breath, orthopnea, PND, lower extremity edema, dizziness, presyncope, syncope, bleeding, or neurologic sequela. The patient is tolerating medications without difficulties and is otherwise without complaint today.    Atrial Fibrillation Risk Factors:  he does have symptoms or diagnosis of sleep apnea. he is not compliant with CPAP therapy. he does not have a history of rheumatic fever. he does not have a history of alcohol use. The patient does have a history of early familial atrial fibrillation or other arrhythmias. Brother had afib.  he has a BMI of Body mass index is 40.82 kg/m.Marland Kitchen Filed Weights   08/28/19 1339  Weight: (!) 140.3 kg    Family History  Problem Relation Age of Onset  . Diabetes Mother   .  Diabetes Father      Atrial Fibrillation Management history:  Previous antiarrhythmic drugs: none Previous cardioversions: none Previous ablations: none CHADS2VASC score: 2 Anticoagulation history: Eliquis   Past Medical History:  Diagnosis Date  . Cardiomyopathy (HCC) 06/2017   EF 40-45%  . Chronic renal insufficiency, stage 3 (moderate)   . Diastolic dysfunction 06/2017   grade 2   . Hypertension   . Hypertensive urgency   . Obesity    No past surgical history on file.  Current Outpatient Medications  Medication Sig Dispense Refill  . albuterol (VENTOLIN HFA) 108 (90 Base) MCG/ACT inhaler Inhale 2 puffs into the lungs every 6 (six) hours as needed for wheezing or shortness of breath (Shortness of breath). 8 g 1  . amLODipine (NORVASC) 10 MG tablet TAKE 1 TABLET (10 MG TOTAL) BY MOUTH DAILY. 30 tablet 2  . apixaban (ELIQUIS) 5 MG TABS tablet Take 1 tablet (5 mg total) by mouth 2 (two) times daily. 60 tablet 0  . atorvastatin (LIPITOR) 10 MG tablet Take 1 tablet (10 mg total) by mouth daily. 90 tablet 3  . carvedilol (COREG) 3.125 MG tablet TAKE 1 TABLET (3.125 MG TOTAL) BY MOUTH 2 (TWO) TIMES DAILY WITH A MEAL. 60 tablet 3  . fluticasone (FLONASE) 50 MCG/ACT nasal spray Place 2 sprays into both nostrils daily. 16 g 6  . fluticasone-salmeterol (ADVAIR HFA) 115-21 MCG/ACT inhaler Inhale 2 puffs into the lungs 2 (two) times daily. 1 Inhaler 12  . furosemide (LASIX) 40 MG tablet TAKE 1 TABLET (40 MG TOTAL) BY MOUTH DAILY.  30 tablet 2  . hydrALAZINE (APRESOLINE) 50 MG tablet TAKE 1 TABLET (50 MG TOTAL) BY MOUTH 3 (THREE) TIMES DAILY. 90 tablet 2  . isosorbide dinitrate (ISORDIL) 20 MG tablet Take 1 tablet (20 mg total) by mouth 3 (three) times daily. 90 tablet 3  . sodium chloride (OCEAN) 0.65 % SOLN nasal spray Place 2 sprays into both nostrils 3 (three) times daily. 100 mL 0   No current facility-administered medications for this encounter.    Allergies  Allergen Reactions    . Ace Inhibitors Anaphylaxis  . Black Cherry Fruit Extract [Cherry Extract] Anaphylaxis    Tongue    Social History   Socioeconomic History  . Marital status: Single    Spouse name: Not on file  . Number of children: Not on file  . Years of education: Not on file  . Highest education level: Not on file  Occupational History  . Not on file  Tobacco Use  . Smoking status: Current Every Day Smoker    Packs/day: 1.00    Types: Cigarettes  . Smokeless tobacco: Never Used  . Tobacco comment: 1 pack last 2-3 days  Substance and Sexual Activity  . Alcohol use: Yes    Alcohol/week: 1.0 standard drinks    Types: 1 Shots of liquor per week    Comment: socially  . Drug use: Yes    Types: Marijuana    Comment: every other week  . Sexual activity: Not on file  Other Topics Concern  . Not on file  Social History Narrative  . Not on file   Social Determinants of Health   Financial Resource Strain:   . Difficulty of Paying Living Expenses:   Food Insecurity:   . Worried About Programme researcher, broadcasting/film/video in the Last Year:   . Barista in the Last Year:   Transportation Needs:   . Freight forwarder (Medical):   Marland Kitchen Lack of Transportation (Non-Medical):   Physical Activity:   . Days of Exercise per Week:   . Minutes of Exercise per Session:   Stress:   . Feeling of Stress :   Social Connections:   . Frequency of Communication with Friends and Family:   . Frequency of Social Gatherings with Friends and Family:   . Attends Religious Services:   . Active Member of Clubs or Organizations:   . Attends Banker Meetings:   Marland Kitchen Marital Status:   Intimate Partner Violence:   . Fear of Current or Ex-Partner:   . Emotionally Abused:   Marland Kitchen Physically Abused:   . Sexually Abused:      ROS- All systems are reviewed and negative except as per the HPI above.  Physical Exam: Vitals:   08/28/19 1339  BP: (!) 142/92  Pulse: 93  Weight: (!) 140.3 kg  Height: 6\' 1"  (1.854  m)    GEN- The patient is well appearing obese male, alert and oriented x 3 today.   HEENT-head normocephalic, atraumatic, sclera clear, conjunctiva pink, hearing intact, trachea midline. Lungs- Clear to ausculation bilaterally, normal work of breathing Heart- Regular rate and rhythm, no murmurs, rubs or gallops  GI- soft, NT, ND, + BS Extremities- no clubbing, cyanosis, or edema MS- no significant deformity or atrophy Skin- no rash or lesion Psych- euthymic mood, full affect Neuro- strength and sensation are intact   Wt Readings from Last 3 Encounters:  08/28/19 (!) 140.3 kg  08/02/19 131.5 kg  07/26/19 132 kg  EKG today demonstrates SR HR 93, PR 168, QRS 92, QTc 425  Echo 06/06/17 demonstrated  Left ventricle: The cavity size was mildly dilated. There was  moderate concentric hypertrophy. Systolic function was mildly to  moderately reduced. The estimated ejection fraction was in the  range of 40% to 45%. Mild diffuse hypokinesis with no  identifiable regional variations. Features are consistent with a  pseudonormal left ventricular filling pattern, with concomitant  abnormal relaxation and increased filling pressure (grade 2  diastolic dysfunction).   Epic records are reviewed at length today  CHA2DS2-VASc Score = 2 The patient's score is based upon: CHF History: Yes HTN History: Yes Age : < 65 Diabetes History: No Stroke History: No Vascular Disease History: No Gender: Male   ASSESSMENT AND PLAN: 1. Paroxysmal Atrial Fibrillation (ICD10:  I48.0) The patient's CHA2DS2-VASc score is 2, indicating a 2.2% annual risk of stroke.   Patient appears to be maintaining SR.  Continue Eliquis 5 mg BID.  Continue Coreg 3.125 mg BID Lifestyle modification was discussed and encouraged including smoking cessation.   2. Secondary Hypercoagulable State (ICD10:  D68.69) The patient is at significant risk for stroke/thromboembolism based upon his CHA2DS2-VASc  Score of 2.  Continue Apixaban (Eliquis).   3. Obesity Body mass index is 40.82 kg/m. Lifestyle modification was discussed and encouraged including regular physical activity and weight reduction.  4. Obstructive sleep apnea Patient working on filling out forms to get CPAP at low cost.  5. HTN Stable, no changes today.  6. Chronic combined systolic and diastolic CHF Dilated CM, EF 40-45% No symptoms of fluid overload but his weight is up. He has been without his Lasix for several days but plans to pick it up after leaving the office today.    Follow up with Kerin Ransom as scheduled. AF clinic as needed.     [facility] [address] [place], [place] [address] [phone] 08/28/2019 2:07 PM

## 2019-08-29 MED FILL — AMLODIPINE BESYLATE 10 MG T: 10 | 30 days supply | Qty: 30 | Fill #0

## 2019-08-29 MED FILL — ?FUROSEMIDE 40 MG TABLET: 40 | 30 days supply | Qty: 30 | Fill #0

## 2019-08-29 MED FILL — ISOSORBIDE DN 20 MG TABLET: 20 | 30 days supply | Qty: 90 | Fill #0

## 2019-09-10 ENCOUNTER — Ambulatory Visit: Payer: Self-pay

## 2019-10-02 ENCOUNTER — Other Ambulatory Visit: Payer: Self-pay | Admitting: Internal Medicine

## 2019-10-02 DIAGNOSIS — I5042 Chronic combined systolic (congestive) and diastolic (congestive) heart failure: Secondary | ICD-10-CM

## 2019-10-02 DIAGNOSIS — I10 Essential (primary) hypertension: Secondary | ICD-10-CM

## 2019-10-02 MED FILL — ?FUROSEMIDE 40 MG TABLET: 40 | 30 days supply | Qty: 30 | Fill #0

## 2019-10-02 MED FILL — ISOSORBIDE DN 20 MG TABLET: 20 | 30 days supply | Qty: 90 | Fill #0

## 2019-10-02 MED FILL — ?CARVEDILOL 3.125 MG TABLET: 3.125 | 30 days supply | Qty: 60 | Fill #3

## 2019-10-02 MED FILL — hydrALAZINE HCL 50 MG TABS: 50 | 30 days supply | Qty: 90 | Fill #2

## 2019-10-02 MED FILL — ?AMLODIPINE BESYL 10MG TABL: 10 | 30 days supply | Qty: 30 | Fill #0

## 2019-10-03 ENCOUNTER — Ambulatory Visit: Payer: Self-pay | Admitting: Cardiology

## 2019-11-05 ENCOUNTER — Ambulatory Visit: Payer: Self-pay | Admitting: Cardiology

## 2019-11-06 ENCOUNTER — Other Ambulatory Visit: Payer: Self-pay | Admitting: Cardiology

## 2019-11-06 ENCOUNTER — Ambulatory Visit (INDEPENDENT_AMBULATORY_CARE_PROVIDER_SITE_OTHER): Payer: Self-pay | Admitting: Cardiology

## 2019-11-06 ENCOUNTER — Encounter: Payer: Self-pay | Admitting: Cardiology

## 2019-11-06 ENCOUNTER — Other Ambulatory Visit: Payer: Self-pay

## 2019-11-06 VITALS — BP 148/114 | HR 90 | Ht 72.0 in | Wt 302.8 lb

## 2019-11-06 DIAGNOSIS — I480 Paroxysmal atrial fibrillation: Secondary | ICD-10-CM

## 2019-11-06 DIAGNOSIS — N1831 Chronic kidney disease, stage 3a: Secondary | ICD-10-CM

## 2019-11-06 DIAGNOSIS — G4733 Obstructive sleep apnea (adult) (pediatric): Secondary | ICD-10-CM

## 2019-11-06 DIAGNOSIS — Z87898 Personal history of other specified conditions: Secondary | ICD-10-CM

## 2019-11-06 DIAGNOSIS — I5042 Chronic combined systolic (congestive) and diastolic (congestive) heart failure: Secondary | ICD-10-CM

## 2019-11-06 DIAGNOSIS — I420 Dilated cardiomyopathy: Secondary | ICD-10-CM

## 2019-11-06 DIAGNOSIS — I10 Essential (primary) hypertension: Secondary | ICD-10-CM

## 2019-11-06 MED ORDER — FUROSEMIDE 40 MG PO TABS: 40.0000 mg | ORAL_TABLET | Freq: Every day | ORAL | 3 refills | Status: DC

## 2019-11-06 MED ORDER — ISOSORBIDE DINITRATE 20 MG PO TABS: 20.0000 mg | ORAL_TABLET | Freq: Three times a day (TID) | ORAL | 3 refills | Status: DC

## 2019-11-06 MED ORDER — AMLODIPINE BESYLATE 10 MG PO TABS: 10.0000 mg | ORAL_TABLET | Freq: Every day | ORAL | 3 refills | Status: DC

## 2019-11-06 MED ORDER — CARVEDILOL 3.125 MG PO TABS: 3.1250 mg | ORAL_TABLET | Freq: Two times a day (BID) | ORAL | 3 refills | Status: DC

## 2019-11-06 MED ORDER — APIXABAN 5 MG PO TABS: 5.0000 mg | ORAL_TABLET | Freq: Two times a day (BID) | ORAL | 3 refills | Status: DC

## 2019-11-06 MED ORDER — HYDRALAZINE HCL 50 MG PO TABS: 50.0000 mg | ORAL_TABLET | Freq: Three times a day (TID) | ORAL | 3 refills | Status: DC

## 2019-11-06 MED ORDER — ATORVASTATIN CALCIUM 10 MG PO TABS: 10.0000 mg | ORAL_TABLET | Freq: Every day | ORAL | 3 refills | Status: DC

## 2019-11-06 MED FILL — ISOSORBIDE DN 20 MG TABLET: 20 | 30 days supply | Qty: 90 | Fill #0

## 2019-11-06 MED FILL — hydrALAZINE HCL 50 MG TABS: 50 | 30 days supply | Qty: 90 | Fill #0

## 2019-11-06 MED FILL — !ELIQUIS 5MG TABLET: 5 | 30 days supply | Qty: 60 | Fill #0

## 2019-11-06 NOTE — Patient Instructions (Signed)
Medication Instructions:   START Allegra 10 mg daily as needed for sinus congestion.  *If you need a refill on your cardiac medications before your next appointment, please call your pharmacy*   Follow-Up: At [facility], you and your health needs are our priority.  As part of our continuing mission to provide you with exceptional heart care, we have created designated Provider Care Teams.  These Care Teams include your primary Cardiologist (physician) and Advanced Practice Providers (APPs -  Physician Assistants and Nurse Practitioners) who all work together to provide you with the care you need, when you need it.  We recommend signing up for the patient portal called "MyChart".  Sign up information is provided on this After Visit Summary.  MyChart is used to connect with patients for Virtual Visits (Telemedicine).  Patients are able to view lab/test results, encounter notes, upcoming appointments, etc.  Non-urgent messages can be sent to your provider as well.   To learn more about what you can do with MyChart, go to [email or web address].    Your next appointment:   6 month(s)  The format for your next appointment:   In Person  Provider:   Corine Shelter, PA-C   Other Instructions Please call our office 2-3 months in advance to schedule your follow-up appointment.

## 2019-11-06 NOTE — Progress Notes (Signed)
Cardiology Office Note:    Date:  11/06/2019   ID:  Zachary Hawkins, Zachary Hawkins [birth year], MRN [id number]  PCP:  Marcine Matar, MD  Cardiologist:  Thurmon Fair, MD  Electrophysiologist:  Seen in AF clinic  Referring MD: Marcine Matar, MD   No chief complaint on file.   History of Present Illness:    Zachary Hawkins is a 64 y.o. male with a hx of HTN and HTN CM with an EF of 40-45% Jan 2019, CRI-3, obesity, untreated OSA, and smoking. He was originally seen in consult 06/05/17 for bradycardia, accelerated HTN,renal insufficiency,and acute combined CHF. His EF was 40-45%with grade 2 DD and moderate LVH. His weight on admission was 294 lb, at discharge 285 lbs. His last office visit was April 2019.  In June 2019 he was admitted with angioedema from ACE-I.   He has had chronic compliance issues with medications secondary to financial issues.  I saw him last in Nov 2020.  He was unable to afford C-pap, he has no insurance or Medicaid and there were no compassionate use programs available (COVID).   In Feb 2021 he was referred to the AF clinic for PAF documented on an ED visit 06/28/2019.  His HR was controlled, Eliquis added.  At f/u 08/28/2019 he was in NSR.  He is seen today for follow up.  Once again he has run out of his medications, he has been out for 4-5 days.  Overall he feels OK, he complains of sinus drainage.    Past Medical History:  Diagnosis Date  . Cardiomyopathy (HCC) 06/2017   EF 40-45%  . Chronic renal insufficiency, stage 3 (moderate)   . Diastolic dysfunction 06/2017   grade 2   . Hypertension   . Hypertensive urgency   . Obesity     No past surgical history on file.  Current Medications: Current Meds  Medication Sig  . albuterol (VENTOLIN HFA) 108 (90 Base) MCG/ACT inhaler Inhale 2 puffs into the lungs every 6 (six) hours as needed for wheezing or shortness of breath (Shortness of breath).  Marland Kitchen amLODipine (NORVASC) 10 MG tablet TAKE 1 TABLET (10 MG TOTAL) BY  MOUTH DAILY.  Marland Kitchen apixaban (ELIQUIS) 5 MG TABS tablet Take 1 tablet (5 mg total) by mouth 2 (two) times daily.  Marland Kitchen atorvastatin (LIPITOR) 10 MG tablet Take 1 tablet (10 mg total) by mouth daily.  . carvedilol (COREG) 3.125 MG tablet TAKE 1 TABLET (3.125 MG TOTAL) BY MOUTH 2 (TWO) TIMES DAILY WITH A MEAL.  . fluticasone (FLONASE) 50 MCG/ACT nasal spray Place 2 sprays into both nostrils daily.  . fluticasone-salmeterol (ADVAIR HFA) 115-21 MCG/ACT inhaler Inhale 2 puffs into the lungs 2 (two) times daily.  . furosemide (LASIX) 40 MG tablet TAKE 1 TABLET (40 MG TOTAL) BY MOUTH DAILY.  . hydrALAZINE (APRESOLINE) 50 MG tablet TAKE 1 TABLET (50 MG TOTAL) BY MOUTH 3 (THREE) TIMES DAILY.  . isosorbide dinitrate (ISORDIL) 20 MG tablet TAKE 1 TABLET (20 MG TOTAL) BY MOUTH 3 (THREE) TIMES DAILY.  . sodium chloride (OCEAN) 0.65 % SOLN nasal spray Place 2 sprays into both nostrils 3 (three) times daily.     Allergies:   Ace inhibitors and Black cherry fruit extract [name]]   Social History   Socioeconomic History  . Marital status: Single    Spouse name: Not on file  . Number of children: Not on file  . Years of education: Not on file  . Highest education level: Not  on file  Occupational History  . Not on file  Tobacco Use  . Smoking status: Current Every Day Smoker    Packs/day: 1.00    Types: Cigarettes  . Smokeless tobacco: Never Used  . Tobacco comment: 1 pack last 2-3 days  Substance and Sexual Activity  . Alcohol use: Yes    Alcohol/week: 1.0 standard drink    Types: 1 Shots of liquor per week    Comment: socially  . Drug use: Yes    Types: Marijuana    Comment: every other week  . Sexual activity: Not on file  Other Topics Concern  . Not on file  Social History Narrative  . Not on file   Social Determinants of Health   Financial Resource Strain:   . Difficulty of Paying Living Expenses:   Food Insecurity:   . Worried About Programme researcher, broadcasting/film/video in the Last Year:   . Occupational psychologist in the Last Year:   Transportation Needs:   . Freight forwarder (Medical):   Marland Kitchen Lack of Transportation (Non-Medical):   Physical Activity:   . Days of Exercise per Week:   . Minutes of Exercise per Session:   Stress:   . Feeling of Stress :   Social Connections:   . Frequency of Communication with Friends and Family:   . Frequency of Social Gatherings with Friends and Family:   . Attends Religious Services:   . Active Member of Clubs or Organizations:   . Attends Banker Meetings:   Marland Kitchen Marital Status:      Family History: The patient's family history includes Diabetes in his father and mother.  ROS:   Please see the history of present illness.     All other systems reviewed and are negative.  EKGs/Labs/Other Studies Reviewed:    The following studies were reviewed today: Echo 2019- Study Conclusions   - Left ventricle: The cavity size was mildly dilated. There was  moderate concentric hypertrophy. Systolic function was mildly to  moderately reduced. The estimated ejection fraction was in the  range of 40% to 45%. Mild diffuse hypokinesis with no  identifiable regional variations. Features are consistent with a  pseudonormal left ventricular filling pattern, with concomitant  abnormal relaxation and increased filling pressure (grade 2  diastolic dysfunction).   EKG:  EKG is ordered today.  The ekg ordered today demonstrates NSR, HR 88  Recent Labs: 08/02/2019: B Natriuretic Peptide 27.2; BUN 27; Creatinine, Ser 1.54; Hemoglobin 16.0; Platelets 257; Potassium 4.3; Sodium 138  Recent Lipid Panel    Component Value Date/Time   CHOL 206 (H) 10/12/2018 0842   TRIG 161 (H) 10/12/2018 0842   HDL 47 10/12/2018 0842   CHOLHDL 4.4 10/12/2018 0842   LDLCALC 127 (H) 10/12/2018 0630    Physical Exam:    VS:  There were no vitals taken for this visit.    Wt Readings from Last 3 Encounters:  08/28/19 (!) 309 lb 6.4 oz (140.3 kg)   08/02/19 290 lb (131.5 kg)  07/26/19 291 lb 0.1 oz (132 kg)     GEN: Obese AA male, well developed in no acute distress HEENT: Normal NECK: No JVD; No carotid bruits CARDIAC: RRR, no murmurs, rubs, gallops RESPIRATORY:  Clear to auscultation without rales, wheezing or rhonchi  ABDOMEN: Obese, non-distended MUSCULOSKELETAL:  No edema; No deformity  SKIN: Warm and dry NEUROLOGIC:  Alert and oriented x 3 PSYCHIATRIC:  Normal affect   ASSESSMENT:  Chronic systolic and diastolic CHF Symptomatically he appears stable. We again discussed the importance of a low sodium diet.  Hypertension with HCM- B/P uncontrolled secondary to non compliance with medications  Dilated cardiomyopathy (HCC) EF 40-45%, moderate LVH, grade 2 DD- Jan 2019  CKD (chronic kidney disease) stage 3, GFR 30-59 ml/min (HCC) SCr 1.54, GFR 56 in March 2021  Non compliance w medication regimen He is struggling with finances.  Sleep apnea- Unable to afford C-pap   PLAN:    Plan: Meds refilled. Allegra 10 mg PRN.  As always he is very appreciative of our care. He has social services trying to help him out but they have been unable to get very far. F/U 6 months.    Medication Adjustments/Labs and Tests Ordered: Current medicines are reviewed at length with the patient today.  Concerns regarding medicines are outlined above.  No orders of the defined types were placed in this encounter.  No orders of the defined types were placed in this encounter.   There are no Patient Instructions on file for this visit.   Signed, Corine Shelter, PA-C  11/06/2019 2:03 PM    [facility]

## 2019-11-08 MED FILL — ?FUROSEMIDE 4OMG TABLETS: 40 | 30 days supply | Qty: 30 | Fill #0

## 2019-11-08 MED FILL — ?AMLODIPINE BESYL 10MG TABL: 10 | 30 days supply | Qty: 30 | Fill #0

## 2019-11-08 MED FILL — ?CARVEDILOL 3.125 MG TABLET: 3.125 | 30 days supply | Qty: 60 | Fill #0

## 2019-11-08 MED FILL — ?ATORVASTATIN 10 MG TABLET: 10 | 30 days supply | Qty: 30 | Fill #0

## 2019-12-14 MED FILL — ?CARVEDILOL 3.125 MG TABLET: 3.125 | 30 days supply | Qty: 60 | Fill #1

## 2019-12-14 MED FILL — ?ATORVASTATIN 10 MG TABLET: 10 | 30 days supply | Qty: 30 | Fill #1

## 2019-12-14 MED FILL — ?FUROSEMIDE 4OMG TABLETS: 40 | 30 days supply | Qty: 30 | Fill #1

## 2019-12-14 MED FILL — AMLODIPINE BESYLATE 10 MG T: 10 | 30 days supply | Qty: 30 | Fill #1

## 2019-12-18 ENCOUNTER — Telehealth: Payer: Self-pay

## 2019-12-18 NOTE — Telephone Encounter (Signed)
This CM spoke to [facility] regarding CPAP order. [facility] has been bought out by [employer]  She said that the order was voided because the patient stated that he could not afford the $933 out of pocket expense and she said that [employer] does not have a hardship program or any assistance for patients without insurance or income

## 2019-12-26 ENCOUNTER — Ambulatory Visit: Payer: Self-pay

## 2020-01-03 ENCOUNTER — Telehealth: Payer: Self-pay | Admitting: Cardiology

## 2020-01-03 NOTE — Telephone Encounter (Signed)
Pt wanted to know if Franky Macho would send the patient an rx for eye drops. The patient thinks he may have pinkeye. The pt still uses [facility] - [place], [place] - [address] as his pharmacy

## 2020-01-03 NOTE — Telephone Encounter (Signed)
Returned call to patient, advised to reach out to PCP to evaluate/prescribe something for possible pink eye.  He verbalized understanding.

## 2020-01-04 ENCOUNTER — Other Ambulatory Visit: Payer: Self-pay

## 2020-01-04 ENCOUNTER — Emergency Department (HOSPITAL_COMMUNITY)
Admission: EM | Admit: 2020-01-04 | Discharge: 2020-01-04 | Disposition: A | Discharge status: Home / Self Care | Payer: Self-pay | Attending: Emergency Medicine | Admitting: Emergency Medicine

## 2020-01-04 ENCOUNTER — Encounter (HOSPITAL_COMMUNITY): Payer: Self-pay

## 2020-01-04 DIAGNOSIS — I5042 Chronic combined systolic (congestive) and diastolic (congestive) heart failure: Secondary | ICD-10-CM | POA: Insufficient documentation

## 2020-01-04 DIAGNOSIS — I160 Hypertensive urgency: Secondary | ICD-10-CM | POA: Insufficient documentation

## 2020-01-04 DIAGNOSIS — F17210 Nicotine dependence, cigarettes, uncomplicated: Secondary | ICD-10-CM | POA: Insufficient documentation

## 2020-01-04 DIAGNOSIS — N183 Chronic kidney disease, stage 3 unspecified: Secondary | ICD-10-CM | POA: Insufficient documentation

## 2020-01-04 DIAGNOSIS — Z79899 Other long term (current) drug therapy: Secondary | ICD-10-CM | POA: Insufficient documentation

## 2020-01-04 DIAGNOSIS — Z7901 Long term (current) use of anticoagulants: Secondary | ICD-10-CM | POA: Insufficient documentation

## 2020-01-04 DIAGNOSIS — I130 Hypertensive heart and chronic kidney disease with heart failure and stage 1 through stage 4 chronic kidney disease, or unspecified chronic kidney disease: Secondary | ICD-10-CM | POA: Insufficient documentation

## 2020-01-04 DIAGNOSIS — B309 Viral conjunctivitis, unspecified: Secondary | ICD-10-CM | POA: Insufficient documentation

## 2020-01-04 DIAGNOSIS — H53143 Visual discomfort, bilateral: Secondary | ICD-10-CM | POA: Insufficient documentation

## 2020-01-04 MED ORDER — TETRACAINE HCL 0.5 % OP SOLN
2.0000 [drp] | Freq: Once | OPHTHALMIC | Status: AC
  Administered 2020-01-04: 2 [drp] via OPHTHALMIC
  Filled 2020-01-04: qty 4

## 2020-01-04 MED ORDER — FLUORESCEIN SODIUM 1 MG OP STRP
1.0000 | ORAL_STRIP | Freq: Once | OPHTHALMIC | Status: AC
  Administered 2020-01-04: 1 via OPHTHALMIC
  Filled 2020-01-04: qty 1

## 2020-01-04 MED ORDER — GATIFLOXACIN 0.5 % OP SOLN
1.0000 [drp] | Freq: Once | OPHTHALMIC | Status: AC
  Administered 2020-01-04: 1 [drp] via OPHTHALMIC
  Filled 2020-01-04: qty 2.5

## 2020-01-04 NOTE — ED Notes (Signed)
Visual acuity completed: 68ft Equivalent.  Right eye 20/25 Left eye 20/30  Both eyes 20/25

## 2020-01-04 NOTE — ED Provider Notes (Signed)
[facility] EMERGENCY DEPARTMENT Provider Note   CSN: [id number] Arrival date & time: 01/04/20  1164     History Chief Complaint  Patient presents with  . Eye Drainage    Zachary Hawkins is a 64 y.o. male.Right eye 20/25 Left eye 20/30  Both eyes 20/25   The history is provided by the patient.  Conjunctivitis This is a new problem. The current episode started yesterday. The problem occurs constantly. The problem has been gradually worsening. Pertinent negatives include no chest pain, no abdominal pain, no headaches and no shortness of breath. Nothing aggravates the symptoms. Relieved by: warm compress. The treatment provided mild relief.       Past Medical History:  Diagnosis Date  . Cardiomyopathy (HCC) 06/2017   EF 40-45%  . Chronic renal insufficiency, stage 3 (moderate)   . Diastolic dysfunction 06/2017   grade 2   . Hypertension   . Hypertensive urgency   . Obesity     Patient Active Problem List   Diagnosis Date Noted  . Paroxysmal atrial fibrillation (HCC) 07/02/2019  . Secondary hypercoagulable state (HCC) 07/02/2019  . OSA (obstructive sleep apnea) 04/03/2019  . 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 Non compliance w medication regimen 07/13/2017  . Systolic and diastolic CHF, chronic (HCC) 06/23/2017  . Uncontrolled hypertension 06/23/2017  . Obesity with serious comorbidity   . CKD (chronic kidney disease) stage 3, GFR 30-59 ml/min   . Dilated cardiomyopathy (HCC) 06/06/2017  . Dyspnea 06/05/2017    History reviewed. No pertinent surgical history.     Family History  Problem Relation Age of Onset  . Diabetes Mother   . Diabetes Father     Social History   Tobacco Use  . Smoking status: Current Every Day Smoker    Packs/day: 1.00    Types: Cigarettes  . Smokeless tobacco: Never Used  . Tobacco comment: 1 pack last 2-3 days  Substance Use Topics  . Alcohol use: Yes    Alcohol/week: 1.0 standard drink     Types: 1 Shots of liquor per week    Comment: socially  . Drug use: Yes    Types: Marijuana    Comment: every other week    Home Medications Prior to Admission medications   Medication Sig Start Date End Date Taking? Authorizing Provider  albuterol (VENTOLIN HFA) 108 (90 Base) MCG/ACT inhaler Inhale 2 puffs into the lungs every 6 (six) hours as needed for wheezing or shortness of breath (Shortness of breath). 04/18/19   Storm Frisk, MD  amLODipine (NORVASC) 10 MG tablet Take 1 tablet (10 mg total) by mouth daily. 11/06/19   Abelino Derrick, PA-C  apixaban (ELIQUIS) 5 MG TABS tablet Take 1 tablet (5 mg total) by mouth 2 (two) times daily. 11/06/19 12/06/19  Abelino Derrick, PA-C  atorvastatin (LIPITOR) 10 MG tablet Take 1 tablet (10 mg total) by mouth daily. 11/06/19   Abelino Derrick, PA-C  carvedilol (COREG) 3.125 MG tablet Take 1 tablet (3.125 mg total) by mouth 2 (two) times daily with a meal. 11/06/19   Kilroy, Eda Paschal, PA-C  fluticasone (FLONASE) 50 MCG/ACT nasal spray Place 2 sprays into both nostrils daily. 04/18/19   Storm Frisk, MD  fluticasone-salmeterol (ADVAIR HFA) [phone] MCG/ACT inhaler Inhale 2 puffs into the lungs 2 (two) times daily. 04/18/19   Storm Frisk, MD  furosemide (LASIX) 40 MG tablet Take 1 tablet (40 mg total) by mouth daily. 11/06/19  Corine Shelter K, PA-C  hydrALAZINE (APRESOLINE) 50 MG tablet Take 1 tablet (50 mg total) by mouth 3 (three) times daily. 11/06/19   Abelino Derrick, PA-C  isosorbide dinitrate (ISORDIL) 20 MG tablet Take 1 tablet (20 mg total) by mouth 3 (three) times daily. 11/06/19   Abelino Derrick, PA-C  sodium chloride (OCEAN) 0.65 % SOLN nasal spray Place 2 sprays into both nostrils 3 (three) times daily. 04/18/19   Storm Frisk, MD    Allergies    Ace inhibitors and Black cherry fruit extract [name]]  Review of Systems   Review of Systems  Constitutional: Negative for chills and fever.  HENT: Negative for congestion and  rhinorrhea.   Eyes: Positive for photophobia, discharge and redness. Negative for pain and visual disturbance.  Respiratory: Negative for cough and shortness of breath.   Cardiovascular: Negative for chest pain and palpitations.  Gastrointestinal: Negative for abdominal pain, diarrhea, nausea and vomiting.  Genitourinary: Negative for difficulty urinating and dysuria.  Musculoskeletal: Negative for arthralgias and back pain.  Skin: Negative for color change and rash.  Neurological: Negative for light-headedness and headaches.    Physical Exam Updated Vital Signs BP (!) 145/104   Pulse 75   Temp 98.3 F (36.8 C) (Oral)   Resp 18   Ht 6' (1.829 m)   Wt 131.5 kg   SpO2 95%   BMI 39.33 kg/m   Physical Exam Vitals and nursing note reviewed.  Constitutional:      General: He is not in acute distress.    Appearance: Normal appearance.  HENT:     Head: Normocephalic and atraumatic.     Nose: No rhinorrhea.  Eyes:     General: Lids are normal. Lids are everted, no foreign bodies appreciated.        Right eye: No discharge.        Left eye: No discharge.     Extraocular Movements: Extraocular movements intact.     Conjunctiva/sclera:     Right eye: Right conjunctiva is injected. Chemosis present. No exudate or hemorrhage.    Left eye: Left conjunctiva is injected. Chemosis present. No exudate or hemorrhage.    Pupils: Pupils are equal, round, and reactive to light.     Slit lamp exam:    Right eye: No corneal flare or corneal ulcer.     Left eye: No corneal flare or corneal ulcer.  Cardiovascular:     Rate and Rhythm: Normal rate and regular rhythm.  Pulmonary:     Effort: Pulmonary effort is normal.     Breath sounds: No stridor.  Abdominal:     General: Abdomen is flat. There is no distension.     Palpations: Abdomen is soft.  Musculoskeletal:        General: No deformity or signs of injury.  Skin:    General: Skin is warm and dry.  Neurological:     General: No focal  deficit present.     Mental Status: He is alert. Mental status is at baseline.     Motor: No weakness.  Psychiatric:        Mood and Affect: Mood normal.        Behavior: Behavior normal.        Thought Content: Thought content normal.     ED Results / Procedures / Treatments   Labs (all labs ordered are listed, but only abnormal results are displayed) Labs Reviewed - No data to display  EKG None  Radiology No results found.  Procedures Procedures (including critical care time)  Medications Ordered in ED Medications  gatifloxacin (ZYMAXID) 0.5 % ophthalmic drops 1 drop (has no administration in time range)  tetracaine (PONTOCAINE) 0.5 % ophthalmic solution 2 drop (2 drops Left Eye Given by Other 01/04/20 1215)  fluorescein ophthalmic strip 1 strip (1 strip Left Eye Given by Other 01/04/20 1215)    ED Course  I have reviewed the triage vital signs and the nursing notes.  Pertinent labs & imaging results that were available during my care of the patient were reviewed by me and considered in my medical decision making (see chart for details).    MDM Rules/Calculators/A&P                          Patient with concern for bilateral conjunctivitis, left greater than right, normal vision reported.  Mild clear drainage from the left.  History of chronic sinus congestion, no pain with ocular movements.  No proptosis.  Will stain the eye will check pressures in the eye will check formal visual acuity.  No labs needed at this time.  Low suspicion for deep space bacterial infection at this time warm compress relieve symptoms.  No sign of foreign body.  Patient's intraocular pressure is 20 in the left eye, unable to obtain in the right eye as the patient would not hold still despite topical numbing medications.  He does not have significant symptoms in this eye, with a normal eye of the more severely affected eye I do not feel strongly that we need to pry any further at this will  likelihood of glaucoma.  He also denies significant pain or visual acuity changes.  Topical anesthetic did relieve his symptoms.  There is no sign of corneal abrasion on fluorescein staining.  If his visual acuity is truly intact he'll be discharged home with antibiotic ointment to help prevent super infection as well as provide lubrication and comfort.  Visual acuity is as stated below: Right eye 20/25 Left eye 20/30  Both eyes 20/25   He states his vision is at its normal right now.  No significant derangement there is safe for outpatient management.  Final Clinical Impression(s) / ED Diagnoses Final diagnoses:  Acute viral conjunctivitis of both eyes    Rx / DC Orders ED Discharge Orders    None       Sabino Donovan, MD 01/04/20 1239

## 2020-01-04 NOTE — ED Notes (Signed)
Awaiting med from pharmacy

## 2020-01-04 NOTE — ED Triage Notes (Signed)
Pt presents with Left eye swelling, drainage and redness x2 days. Seen a pharmacist yesterday and was told he would need a prescription. New drainage to Right eye this am.

## 2020-01-04 NOTE — Discharge Instructions (Signed)
You can apply the drops 4 times daily for 5 days, follow-up with your doctor.  Apply warm compress multiple times a day to the affected eye

## 2020-01-09 ENCOUNTER — Ambulatory Visit: Payer: Self-pay | Attending: Physician Assistant | Admitting: Physician Assistant

## 2020-01-09 DIAGNOSIS — Z09 Encounter for follow-up examination after completed treatment for conditions other than malignant neoplasm: Secondary | ICD-10-CM

## 2020-01-09 DIAGNOSIS — B309 Viral conjunctivitis, unspecified: Secondary | ICD-10-CM

## 2020-01-09 NOTE — Progress Notes (Signed)
Patient ID: Zachary Hawkins, male   DOB: 1955/08/11, 64 y.o.   MRN: [id number]   Virtual Visit via Telephone Note  I connected with Zachary Hawkins on 01/09/20 at  3:30 PM EDT by telephone and verified that I am speaking with the correct person using two identifiers.   I discussed the limitations, risks, security and privacy concerns of performing an evaluation and management service by telephone and the availability of in person appointments. I also discussed with the patient that there may be a patient responsible charge related to this service. The patient expressed understanding and agreed to proceed.   PATIENT visit by telephone virtually in the context of Covid-19 pandemic. Patient location: home My Location:  [facility] office Persons on the call: me and the patient    History of Present Illness: Seen in the ED 01/04/2020 for viral conjunctivitis.  He was given prescription/sample of gatifloxacin.  He is about 50% improved but is worried that he could have complications from this and "lose his eyes."  The swelling is 50% better.  No discolored drainage and redness is better.  Vision almost back to normal,    From ED A/P: Patient with concern for bilateral conjunctivitis, left greater than right, normal vision reported.  Mild clear drainage from the left.  History of chronic sinus congestion, no pain with ocular movements.  No proptosis.  Will stain the eye will check pressures in the eye will check formal visual acuity.  No labs needed at this time.  Low suspicion for deep space bacterial infection at this time warm compress relieve symptoms.  No sign of foreign body.  Patient's intraocular pressure is 20 in the left eye, unable to obtain in the right eye as the patient would not hold still despite topical numbing medications.  He does not have significant symptoms in this eye, with a normal eye of the more severely affected eye I do not feel strongly that we need to pry any further at this  will likelihood of glaucoma.  He also denies significant pain or visual acuity changes.  Topical anesthetic did relieve his symptoms.  There is no sign of corneal abrasion on fluorescein staining.  If his visual acuity is truly intact he'll be discharged home with antibiotic ointment to help prevent super infection as well as provide lubrication and comfort.  Visual acuity is as stated below: Right eye 20/25 Left eye 20/30  Both eyes 20/25  He states his vision is at its normal right now.  No significant derangement there is safe for outpatient management.   Observations/Objective:  NAD.  A&Ox3   Assessment and Plan: 1. Viral conjunctivitis Continue drops;  Ophthalmology referral for reassurrance in the event he is not at 100% within 1 week - Ambulatory referral to Ophthalmology  2. Encounter for examination following treatment at hospital Significant improvement    Follow Up Instructions: 10/26 appt with Dr Laural Benes   I discussed the assessment and treatment plan with the patient. The patient was provided an opportunity to ask questions and all were answered. The patient agreed with the plan and demonstrated an understanding of the instructions.   The patient was advised to call back or seek an in-person evaluation if the symptoms worsen or if the condition fails to improve as anticipated.  I provided 16 minutes of non-face-to-face time during this encounter.   Georgian Co, PA-C

## 2020-01-17 MED FILL — ?ATORVASTATIN 10 MG TABLET: 10 | 30 days supply | Qty: 30 | Fill #2

## 2020-01-17 MED FILL — hydrALAZINE HCL 50 MG TABS: 50 | 30 days supply | Qty: 90 | Fill #1

## 2020-01-17 MED FILL — ?CARVEDILOL 3.125 MG TABLET: 3.125 | 30 days supply | Qty: 60 | Fill #2

## 2020-01-17 MED FILL — ISOSORBIDE DN 20 MG TABLET: 20 | 30 days supply | Qty: 90 | Fill #1

## 2020-01-17 MED FILL — ?FUROSEMIDE 4OMG TABLETS: 40 | 30 days supply | Qty: 30 | Fill #2

## 2020-01-17 MED FILL — AMLODIPINE BESYLATE 10 MG T: 10 | 30 days supply | Qty: 30 | Fill #2

## 2020-02-21 MED FILL — AMLODIPINE BESYLATE 10 MG T: 10 | 30 days supply | Qty: 30 | Fill #3

## 2020-02-21 MED FILL — ?ATORVASTATIN 10 MG TABLET: 10 | 30 days supply | Qty: 30 | Fill #3

## 2020-02-21 MED FILL — ?ISOSORBIDE DN 20 MG TABLET: 20 | 30 days supply | Qty: 90 | Fill #2

## 2020-02-21 MED FILL — hydrALAZINE HCL 50 MG TABS: 50 | 30 days supply | Qty: 90 | Fill #2

## 2020-02-21 MED FILL — ?FUROSEMIDE 4OMG TABLETS: 40 | 30 days supply | Qty: 30 | Fill #3

## 2020-02-21 MED FILL — ?CARVEDILOL 3.125 MG TABLET: 3.125 | 30 days supply | Qty: 60 | Fill #3

## 2020-03-04 ENCOUNTER — Ambulatory Visit: Payer: Self-pay | Admitting: Internal Medicine

## 2020-04-09 MED FILL — ?FUROSEMIDE 4OMG TABLETS: 40 | 30 days supply | Qty: 30 | Fill #4

## 2020-04-09 MED FILL — ?CARVEDILOL 3.125 MG TABLET: 3.125 | 30 days supply | Qty: 60 | Fill #4

## 2020-04-09 MED FILL — AMLODIPINE BESYLATE 10 MG T: 10 | 30 days supply | Qty: 30 | Fill #4

## 2020-04-09 MED FILL — hydrALAZINE HCL 50 MG TABS: 50 | 30 days supply | Qty: 90 | Fill #3

## 2020-04-09 MED FILL — ?ATORVASTATIN 10 MG TABLET: 10 | 30 days supply | Qty: 30 | Fill #4

## 2020-04-09 MED FILL — ?ISOSORBIDE DN 20 MG TABLET: 20 | 30 days supply | Qty: 90 | Fill #3

## 2020-04-22 ENCOUNTER — Ambulatory Visit: Payer: Self-pay | Admitting: Physician Assistant

## 2020-05-10 DIAGNOSIS — I480 Paroxysmal atrial fibrillation: Secondary | ICD-10-CM

## 2020-05-10 HISTORY — DX: Paroxysmal atrial fibrillation: I48.0

## 2020-05-19 MED FILL — AMLODIPINE BESYLATE 10 MG T: 10 | 30 days supply | Qty: 30 | Fill #5

## 2020-05-19 MED FILL — ?FUROSEMIDE 40 MG TABLET: 40 | 30 days supply | Qty: 30 | Fill #5

## 2020-05-19 MED FILL — ?ATORVASTATIN 10 MG TABLET: 10 | 30 days supply | Qty: 30 | Fill #5

## 2020-05-19 MED FILL — hydrALAZINE HCL 50 MG TABS: 50 | 30 days supply | Qty: 90 | Fill #4

## 2020-05-19 MED FILL — ?CARVEDILOL 3.125 MG TABLET: 3.125 | 30 days supply | Qty: 60 | Fill #5

## 2020-05-19 MED FILL — ISOSORBIDE DINITRATE 20 MG: 20 | 30 days supply | Qty: 90 | Fill #4

## 2020-06-02 ENCOUNTER — Other Ambulatory Visit (HOSPITAL_COMMUNITY): Payer: Self-pay

## 2020-06-02 MED ORDER — APIXABAN 5 MG PO TABS: 5.0000 mg | ORAL_TABLET | Freq: Two times a day (BID) | ORAL | 3 refills | Status: DC

## 2020-06-26 ENCOUNTER — Ambulatory Visit: Payer: Self-pay | Attending: Internal Medicine | Admitting: Internal Medicine

## 2020-06-26 ENCOUNTER — Encounter: Payer: Self-pay | Admitting: Internal Medicine

## 2020-06-26 ENCOUNTER — Telehealth: Payer: Self-pay | Admitting: Internal Medicine

## 2020-06-26 ENCOUNTER — Other Ambulatory Visit: Payer: Self-pay

## 2020-06-26 ENCOUNTER — Other Ambulatory Visit: Payer: Self-pay | Admitting: Internal Medicine

## 2020-06-26 VITALS — BP 165/117 | HR 89 | Resp 16 | Wt 307.0 lb

## 2020-06-26 DIAGNOSIS — Z9229 Personal history of other drug therapy: Secondary | ICD-10-CM | POA: Insufficient documentation

## 2020-06-26 DIAGNOSIS — Z2821 Immunization not carried out because of patient refusal: Secondary | ICD-10-CM

## 2020-06-26 DIAGNOSIS — R0981 Nasal congestion: Secondary | ICD-10-CM

## 2020-06-26 DIAGNOSIS — K029 Dental caries, unspecified: Secondary | ICD-10-CM

## 2020-06-26 DIAGNOSIS — I5042 Chronic combined systolic (congestive) and diastolic (congestive) heart failure: Secondary | ICD-10-CM

## 2020-06-26 DIAGNOSIS — I10 Essential (primary) hypertension: Secondary | ICD-10-CM

## 2020-06-26 MED ORDER — FLUTICASONE PROPIONATE 50 MCG/ACT NA SUSP: 2.0000 | Freq: Every day | NASAL | 6 refills | Status: DC

## 2020-06-26 MED ORDER — AMOXICILLIN 500 MG PO CAPS: 500.0000 mg | ORAL_CAPSULE | Freq: Three times a day (TID) | ORAL | 0 refills | Status: DC

## 2020-06-26 MED ORDER — LORATADINE 10 MG PO TABS: 10.0000 mg | ORAL_TABLET | Freq: Every day | ORAL | 0 refills | Status: DC

## 2020-06-26 NOTE — Patient Instructions (Signed)
Stop using the Afrin nasal spray. We will send the prescription to the pharmacy for some antibiotics for you.  When you come for blood pressure recheck please bring all of your medicines with you.  Remember to stop at the lab to have blood test done.

## 2020-06-26 NOTE — Progress Notes (Signed)
Patient ID: Zachary Hawkins, male    DOB: 10/02/1955  MRN: [id number]  CC: Hypertension   Subjective: Zachary Hawkins is a 65 y.o. 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  Last seen by me 09/2018   C/o of his nose being stopped up all the time which makes it hard for him to breathe especially when he is eating.  He states that this problem has been going on for over 6 months.  Finds himself having to blow his nose all the time.  He has been using Afrin nasal spray 4 times a week sometimes more.  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    Lips are dry especially at nights.  He keeps cold water at his bedside.  Reports nocturia.  He is on furosemide but takes that in the mornings.   HTN/CHF: NO LE edema.Marland Kitchen  +Polyuria especially at night He confirms that he is taking his heart medications including hydralazine, Eliquis, furosemide, carvedilol, isosorbide, amlodipine and Lipitor.  He has not taken all of his medications as yet for the morning. Limits salt No PND, orthopnea.  No cough  Also reports tooth ache x 4-5 days.  LT upper jaw incisor.   HM:  Had Pfizer COVID-19 vaccine and booster shot Patient Active Problem List   Diagnosis Date Noted  . Influenza vaccine refused 06/26/2020  . Paroxysmal atrial fibrillation (HCC) 07/02/2019  . Secondary hypercoagulable state (HCC) 07/02/2019  . OSA (obstructive sleep apnea) 04/03/2019  . 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 Non compliance w medication regimen 07/13/2017  . Systolic and diastolic CHF, chronic (HCC) 06/23/2017  . Uncontrolled hypertension 06/23/2017  . Obesity with serious comorbidity   . CKD (chronic kidney disease) stage 3, GFR 30-59 ml/min (HCC)   . Dilated cardiomyopathy (HCC) 06/06/2017  . Dyspnea  06/05/2017     Current Outpatient Medications on File Prior to Visit  Medication Sig Dispense Refill  . albuterol (VENTOLIN HFA) 108 (90 Base) MCG/ACT inhaler Inhale 2 puffs into the lungs every 6 (six) hours as needed for wheezing or shortness of breath (Shortness of breath). 8 g 1  . amLODipine (NORVASC) 10 MG tablet Take 1 tablet (10 mg total) by mouth daily. 90 tablet 3  . apixaban (ELIQUIS) 5 MG TABS tablet Take 1 tablet (5 mg total) by mouth 2 (two) times daily. 180 tablet 3  . atorvastatin (LIPITOR) 10 MG tablet Take 1 tablet (10 mg total) by mouth daily. 90 tablet 3  . carvedilol (COREG) 3.125 MG tablet Take 1 tablet (3.125 mg total) by mouth 2 (two) times daily with a meal. 180 tablet 3  . fluticasone (FLONASE) 50 MCG/ACT nasal spray Place 2 sprays into both nostrils daily. 16 g 6  . fluticasone-salmeterol (ADVAIR HFA) 115-21 MCG/ACT inhaler Inhale 2 puffs into the lungs 2 (two) times daily. 1 Inhaler 12  . furosemide (LASIX) 40 MG tablet Take 1 tablet (40 mg total) by mouth daily. 90 tablet 3  . hydrALAZINE (APRESOLINE) 50 MG tablet Take 1 tablet (50 mg total) by mouth 3 (three) times daily. 270 tablet 3  . isosorbide dinitrate (ISORDIL) 20 MG tablet Take 1 tablet (20 mg total) by mouth 3 (three) times daily. 270 tablet 3  . sodium chloride (OCEAN) 0.65 % SOLN nasal spray Place  2 sprays into both nostrils 3 (three) times daily. 100 mL 0   No current facility-administered medications on file prior to visit.    Allergies  Allergen Reactions  . Ace Inhibitors Anaphylaxis  . Black Cherry Fruit Extract [Cherry Extract] Anaphylaxis    Tongue    Social History   Socioeconomic History  . Marital status: Single    Spouse name: Not on file  . Number of children: Not on file  . Years of education: Not on file  . Highest education level: Not on file  Occupational History  . Not on file  Tobacco Use  . Smoking status: Current Every Day Smoker    Packs/day: 1.00    Types:  Cigarettes  . Smokeless tobacco: Never Used  . Tobacco comment: 1 pack last 2-3 days  Substance and Sexual Activity  . Alcohol use: Yes    Alcohol/week: 1.0 standard drink    Types: 1 Shots of liquor per week    Comment: socially  . Drug use: Yes    Types: Marijuana    Comment: every other week  . Sexual activity: Not on file  Other Topics Concern  . Not on file  Social History Narrative  . Not on file   Social Determinants of Health   Financial Resource Strain: Not on file  Food Insecurity: Not on file  Transportation Needs: Not on file  Physical Activity: Not on file  Stress: Not on file  Social Connections: Not on file  Intimate Partner Violence: Not on file    Family History  Problem Relation Age of Onset  . Diabetes Mother   . Diabetes Father     No past surgical history on file.  ROS: Review of Systems Negative except as stated above  PHYSICAL EXAM: BP (!) 165/117   Pulse 89   Resp 16   Wt (!) 307 lb (139.3 kg)   SpO2 95%   BMI 41.64 kg/m   Wt Readings from Last 3 Encounters:  06/26/20 (!) 307 lb (139.3 kg)  01/04/20 290 lb (131.5 kg)  11/06/19 (!) 302 lb 12.8 oz (137.3 kg)    Physical Exam   General appearance - alert, well appearing, older African-American male and in no distress Mental status - normal mood, behavior, speech, dress, motor activity, and thought processes Nose -severe enlargement of both nasal turbinates obstructing the nasal passage mouth -mucosa is moist.  He has several cavities.  Left upper incisor is decayed and partially broken off.  Surrounding erythema of the gum in this area. Neck - supple, no significant adenopathy Chest - clear to auscultation, no wheezes, rales or rhonchi, symmetric air entry Heart - normal rate, regular rhythm, normal S1, S2, no murmurs, rubs, clicks or gallops.  No JVD Extremities -trace to 1+ bilateral lower extremity edema. CMP Latest Ref Rng & Units 08/02/2019 07/26/2019 06/28/2019  Glucose 70 - 99  mg/dL 161(W) 960(A) 540(J)  BUN 8 - 23 mg/dL 81(X) 91(Y) 20  Creatinine 0.61 - 1.24 mg/dL 7.82(N) 5.62(Z) 3.08(M)  Sodium 135 - 145 mmol/L 138 138 137  Potassium 3.5 - 5.1 mmol/L 4.3 4.5 4.2  Chloride 98 - 111 mmol/L 104 104 104  CO2 22 - 32 mmol/L 25 24 25   Calcium 8.9 - 10.3 mg/dL 9.0 8.9 )  Total Protein 6.0 - 8.5 g/dL - - -  Total Bilirubin 0.0 - 1.2 mg/dL - - -  Alkaline Phos 39 - 117 IU/L - - -  AST 0 - 40 IU/L - - -  ALT 0 - 44 IU/L - - -   Lipid Panel     Component Value Date/Time   CHOL 206 (H) 10/12/2018 0842   TRIG 161 (H) 10/12/2018 0842   HDL 47 10/12/2018 0842   CHOLHDL 4.4 10/12/2018 0842   LDLCALC 127 (H) 10/12/2018 0842    CBC    Component Value Date/Time   WBC 6.3 08/02/2019 0718   RBC 5.40 08/02/2019 0718   HGB 16.0 08/02/2019 0718   HGB 16.5 10/12/2018 0842   HCT 50.6 08/02/2019 0718   HCT 49.2 10/12/2018 0842   PLT 257 08/02/2019 0718   PLT 256 10/12/2018 0842   MCV 93.7 08/02/2019 0718   MCV 88 10/12/2018 0842   MCH 29.6 08/02/2019 0718   MCHC 31.6 08/02/2019 0718   RDW 14.7 08/02/2019 0718   RDW 13.5 10/12/2018 0842   LYMPHSABS 3.4 10/13/2017 0432   MONOABS 0.7 10/13/2017 0432   EOSABS 0.7 10/13/2017 0432   BASOSABS 0.1 10/13/2017 0432    ASSESSMENT AND PLAN:  1. Nasal congestion Advised patient to stop Afrin nasal spray.  We will put him back on Flonase.  Add low-dose of Claritin.  We will get him into see the ENT specialist who comes here once a week. - fluticasone (FLONASE) 50 MCG/ACT nasal spray; Place 2 sprays into both nostrils daily.  Dispense: 16 g; Refill: 6 - loratadine (CLARITIN) 10 MG tablet; Take 1 tablet (10 mg total) by mouth daily.  Dispense: 30 tablet; Refill: 0  2. Dental cavities Advised to apply for the orange card/cone discount card so that he can be referred to the dentist.  Use Tylenol as needed for pain. - amoxicillin (AMOXIL) 500 MG capsule; Take 1 capsule (500 mg total) by mouth 3 (three) times daily.   Dispense: 21 capsule; Refill: 0  3. Essential hypertension Not at goal.  Recommend taking his medicines when he returns home.  Follow-up with the clinical pharmacist within a week and bring all of his medicines with him. - CBC; Future - Lipid panel; Future - Comprehensive metabolic panel  4. Systolic and diastolic CHF, chronic (HCC) Continue current medications including isosorbide, carvedilol, furosemide, hydralazine  5. Influenza vaccine refused  6.  COVID-19 vaccine series completed. Patient was given the opportunity to ask questions.  Patient verbalized understanding of the plan and was able to repeat key elements of the plan.   No orders of the defined types were placed in this encounter.    Requested Prescriptions    No prescriptions requested or ordered in this encounter    No follow-ups on file.  Jonah Blue, MD, FACP

## 2020-06-27 MED FILL — FLUTICASONE PROP 50 MCG SPR: 50 | 30 days supply | Qty: 16 | Fill #0

## 2020-06-27 NOTE — Telephone Encounter (Signed)
Contacted pt to go over provider message pt didn't answer left a detailed vm and if he has any questions or concerns to give us a call  

## 2020-07-04 ENCOUNTER — Ambulatory Visit: Payer: Self-pay | Admitting: Pharmacist

## 2020-07-04 ENCOUNTER — Ambulatory Visit: Payer: Self-pay | Admitting: Otolaryngology

## 2020-07-11 ENCOUNTER — Ambulatory Visit (HOSPITAL_BASED_OUTPATIENT_CLINIC_OR_DEPARTMENT_OTHER): Payer: Self-pay | Admitting: Pharmacist

## 2020-07-11 ENCOUNTER — Encounter: Payer: Self-pay | Admitting: Otolaryngology

## 2020-07-11 ENCOUNTER — Telehealth: Payer: Self-pay

## 2020-07-11 ENCOUNTER — Ambulatory Visit: Payer: Self-pay | Attending: Otolaryngology | Admitting: Otolaryngology

## 2020-07-11 ENCOUNTER — Other Ambulatory Visit: Payer: Self-pay

## 2020-07-11 ENCOUNTER — Encounter: Payer: Self-pay | Admitting: Pharmacist

## 2020-07-11 VITALS — BP 138/89

## 2020-07-11 DIAGNOSIS — I10 Essential (primary) hypertension: Secondary | ICD-10-CM

## 2020-07-11 DIAGNOSIS — J329 Chronic sinusitis, unspecified: Secondary | ICD-10-CM

## 2020-07-11 DIAGNOSIS — J324 Chronic pansinusitis: Secondary | ICD-10-CM

## 2020-07-11 DIAGNOSIS — G4733 Obstructive sleep apnea (adult) (pediatric): Secondary | ICD-10-CM

## 2020-07-11 DIAGNOSIS — J339 Nasal polyp, unspecified: Secondary | ICD-10-CM

## 2020-07-11 MED ORDER — PREDNISONE 20 MG PO TABS: 20.0000 mg | ORAL_TABLET | Freq: Every day | ORAL | 0 refills | Status: DC

## 2020-07-11 MED ORDER — AMOXICILLIN-POT CLAVULANATE 500-125 MG PO TABS: 1.0000 | ORAL_TABLET | Freq: Two times a day (BID) | ORAL | 0 refills | Status: DC

## 2020-07-11 MED FILL — AMLODIPINE BESYLATE 10 MG T: 10 | 30 days supply | Qty: 30 | Fill #6

## 2020-07-11 MED FILL — ?CARVEDILOL 3.125 MG TABLET: 3.125 | 30 days supply | Qty: 60 | Fill #6

## 2020-07-11 MED FILL — ?FUROSEMIDE 40 MG TABLET: 40 | 30 days supply | Qty: 30 | Fill #6

## 2020-07-11 MED FILL — ?ATORVASTATIN 10 MG TABLET: 10 | 30 days supply | Qty: 30 | Fill #6

## 2020-07-11 MED FILL — hydrALAZINE HCL 50 MG TABS: 50 | 30 days supply | Qty: 90 | Fill #4

## 2020-07-11 MED FILL — !ELIQUIS 5MG TABLET: 5 | 30 days supply | Qty: 60 | Fill #1

## 2020-07-11 MED FILL — ISOSORBIDE DINITRATE 20 MG: 20 | 30 days supply | Qty: 90 | Fill #4

## 2020-07-11 NOTE — Progress Notes (Signed)
Chief complaint: Nasal congestion  History: 65 year old black male has noticed difficulty with nasal congestion and drainage for 6-9 months.  The color varies from clear to yellow/green.  No blood.  No facial pain or pressure.  He reportedly has a left maxillary tooth which needs extraction.  He has not noticed any correlation with his dental symptoms and his sinus symptoms.  He does have COPD.  He denies ear infections.  He has recently tried some Claritin with some Flonase as needed without any apparent change in symptoms.  He does smoke roughly 1 pack/day.  He has sleep apnea.  He had a sleep study.  CPAP was prescribed but he could not afford it.  Examination: He is heavyset.  Voice is hyponasal and he is breathing through his mouth.  Mental status is appropriate.  he hears well in conversational speech.  Voice is phonatory and respirations unlabored through the mouth.  He head is atraumatic and neck supple.  Cranial nerves grossly intact.  Ear canals are clear with normal drums.  Anterior nose is edematous with inflamed and excoriated polyps filling the airway on both sides.  Oral cavity is clear with teeth in fair to good repair.  Oropharynx is fleshy with a long thick uvula.  Neck is muscular without adenopathy.  Impression: Bilateral obstructing nasal polyposis with probable chronic sinusitis.  Obesity.  Untreated sleep apnea.  Plan: I will treat him with Augmentin 500 mg twice daily x1 month, prednisone 60 mg daily with a taper and continue Flonase.  We will check a limited sinus CT scan in 1 month and then see him back here.  We will try to get a caseworker to help him with his CPAP.  We will have him back next week to check his sugar to see if the prednisone is affecting it adversely.

## 2020-07-11 NOTE — Progress Notes (Signed)
   S:    PCP: Dr. Laural Benes  Patient arrives in good spirits. Presents to the clinic for hypertension evaluation, counseling, and management. Patient was referred and last seen by Primary Care Provider on 06/26/2020.   Medication adherence: pt is taking all antihypertensives with the exception of his isosorbide.   Current BP Medications include:  Amlodipine 10 mg daily, carvedilol 3.125 mg BID, furosemide 40 mg daily, hydralazine 50 mg TID, isosorbide dinitrate 20 mg TID  Dietary habits include: endorses compliance with salt restriction but admits that he uses a lot of hot sauce; denies excessive caffeine intake  Exercise habits include: none Family / Social history:  - FHx: DM - Tobacco: current every day smoker  - Alcohol: social use   O:  Vitals:   07/11/20 1654  BP: 138/89    Home BP readings: none   Last 3 Office BP readings: BP Readings from Last 3 Encounters:  07/11/20 138/89  07/11/20 138/89  06/26/20 (!) 165/117    BMET    Component Value Date/Time   NA 138 08/02/2019 0718   NA 140 10/12/2018 0842   K 4.3 08/02/2019 0718   CL 104 08/02/2019 0718   CO2 25 08/02/2019 0718   GLUCOSE 109 (H) 08/02/2019 0718   BUN 27 (H) 08/02/2019 0718   BUN 24 10/12/2018 0842   CREATININE 1.54 (H) 08/02/2019 0718   CALCIUM 9.0 08/02/2019 0718   GFRNONAA 48 (L) 08/02/2019 0718   GFRAA 56 (L) 08/02/2019 0718    Renal function: CrCl cannot be calculated (Patient's most recent lab result is older than the maximum 21 days allowed.).  Clinical ASCVD: No  The 10-year ASCVD risk score Denman George DC Jr., et al., 2013) is: 27.5%   Values used to calculate the score:     Age: 65 years     Sex: Male     Is Non-Hispanic African American: Yes     Diabetic: No     Tobacco smoker: Yes     Systolic Blood Pressure: 138 mmHg     Is BP treated: Yes     HDL Cholesterol: 47 mg/dL     Total Cholesterol: 206 mg/dL   A/P: Hypertension longstanding currently above goal on current medications.  BP Goal = < 130/80 mmHg. Medication adherence is okay; he is out of isosorbide. I have encouraged him to pick this up and take this along with his other medications as prescribed.   -Continued current regimen.  -Counseled on lifestyle modifications for blood pressure control including reduced dietary sodium, increased exercise, adequate sleep.  Results reviewed and written information provided.   Total time in face-to-face counseling 30 minutes.   F/U Clinic Visit in 1 month.    Butch Penny, PharmD, Patsy [place], CPP Clinical Pharmacist [facility] [phone]

## 2020-07-11 NOTE — Patient Instructions (Signed)
You have polyps in both sides of your nose, probably from chronic sinus infection.  We will use Flonase, 2 puffs each side every day.  I am giving you Augmentin antibiotics for twice daily use for 1 month.  I will also add prednisone which will help shrink the polyps.  Prednisone may aggravate your blood pressure, your blood sugar, and perhaps interfere with your sleep.  We need to see you back next week to check your sugar.  I would like to see you back in 1 month and after that we will probably do a scan of your sinuses.  This may resolve with medications, or possibly may require surgery.

## 2020-07-11 NOTE — Telephone Encounter (Signed)
Pt saw Dr.Wolicki and he informed him that he needs assistance with getting his CPAP machine.

## 2020-07-12 LAB — CBC
Hematocrit: 50.3 % (ref 37.5–51.0)
Hemoglobin: 16.7 g/dL (ref 13.0–17.7)
MCH: 29.3 pg (ref 26.6–33.0)
MCHC: 33.2 g/dL (ref 31.5–35.7)
MCV: 88 fL (ref 79–97)
Platelets: 232 10*3/uL (ref 150–450)
RBC: 5.7 x10E6/uL (ref 4.14–5.80)
RDW: 13.6 % (ref 11.6–15.4)
WBC: 8.1 10*3/uL (ref 3.4–10.8)

## 2020-07-12 LAB — LIPID PANEL
Chol/HDL Ratio: 3.9 ratio (ref 0.0–5.0)
Cholesterol, Total: 189 mg/dL (ref 100–199)
HDL: 49 mg/dL (ref >39)
LDL Chol Calc (NIH): 109 mg/dL — ABNORMAL HIGH (ref 0–99)
Triglycerides: 177 mg/dL — ABNORMAL HIGH (ref 0–149)
VLDL Cholesterol Cal: 31 mg/dL (ref 5–40)

## 2020-07-12 NOTE — Progress Notes (Signed)
Let patient know that his LDL cholesterol is 109 with goal being less than 100.  Please inquire whether or not he has been taking the atorvastatin consistently every day.  If he has been taking it consistently then we will need to increase the dose to 20 mg daily.  Please let me know so that I cannot take the prescription.  His blood cell counts including red blood cell, white blood cell and platelet counts are normal.

## 2020-07-14 ENCOUNTER — Telehealth: Payer: Self-pay

## 2020-07-14 ENCOUNTER — Other Ambulatory Visit: Payer: Self-pay | Admitting: Internal Medicine

## 2020-07-14 DIAGNOSIS — J324 Chronic pansinusitis: Secondary | ICD-10-CM

## 2020-07-14 DIAGNOSIS — J339 Nasal polyp, unspecified: Secondary | ICD-10-CM

## 2020-07-14 MED ORDER — PREDNISONE 20 MG PO TABS: 20.0000 mg | ORAL_TABLET | Freq: Every day | ORAL | 0 refills | Status: DC

## 2020-07-14 MED ORDER — AMOXICILLIN-POT CLAVULANATE 500-125 MG PO TABS
1.0000 | ORAL_TABLET | Freq: Two times a day (BID) | ORAL | 0 refills | Status: DC
Start: 2020-07-14 — End: 2020-07-14

## 2020-07-14 NOTE — Telephone Encounter (Signed)
Contacted pt to go over lab results pt is aware and doesn't have any questions or concerns    Dr. Laural Benes pt states he just got a refill on cholesterol medicine. Pt states he wants to hold off on increasing Atorvastatin. Pt states he doesn't have money to keep getting medications. Pt states he will continue the same dose and work on his eating habits

## 2020-07-14 NOTE — Telephone Encounter (Signed)
Pt was called and informed of medication being sent to pharmacy and CT appointment.

## 2020-07-14 NOTE — Telephone Encounter (Signed)
Pt was given Prednisone and Augmentin from Dr.Wolicki on Friday and the scripts printed is there anyway that you can resend the medication for the patient.

## 2020-07-15 ENCOUNTER — Ambulatory Visit: Payer: Self-pay | Admitting: Pharmacist

## 2020-07-17 ENCOUNTER — Telehealth: Payer: Self-pay

## 2020-07-17 DIAGNOSIS — G4733 Obstructive sleep apnea (adult) (pediatric): Secondary | ICD-10-CM

## 2020-07-17 NOTE — Telephone Encounter (Signed)
Message received that Dr Lazarus Salines, would like patient to have assistance with obtaining a CPAP machien. Call placed to patient # [phone]  regarding need for CPAP machine.  Message left with call back requested to this CM.   As per Epic, he has no insurance and did not follow through with hardship application for a CPAP through [employer]. A new order for CPAP would need to be placed and attempt made to see if patient would qualify for any financial assistance through the [facility]

## 2020-07-21 ENCOUNTER — Ambulatory Visit (HOSPITAL_COMMUNITY): Payer: Self-pay

## 2020-07-22 NOTE — Telephone Encounter (Signed)
I spoke to patient and explained that an order for the CPAP can be sent to [facility]. He does not have insurance but said he might be able to pay for some of the cost of the machine but he needs to know how much it will cost. Informed him that [facility] will contact him with the cost and we can see if there is any additional funding available to assist with the portion he is not able to pay.  He was in agreement with this plan.  He had been referred to [facility] hardship program  but never followed through with that application.    A new CPAP order can be placed but the settings need to be documented on the order.

## 2020-07-23 ENCOUNTER — Other Ambulatory Visit: Payer: Self-pay | Admitting: Critical Care Medicine

## 2020-07-23 MED FILL — $ADVAIR HFA 115-21 MCG INH: 115-21 | 90 days supply | Qty: 36 | Fill #0

## 2020-07-23 NOTE — Telephone Encounter (Signed)
Order reentered and on printer

## 2020-07-24 ENCOUNTER — Telehealth: Payer: Self-pay

## 2020-07-24 NOTE — Telephone Encounter (Signed)
Order for CPAP faxed to [facility] 

## 2020-09-03 ENCOUNTER — Telehealth: Payer: Self-pay

## 2020-09-03 NOTE — Telephone Encounter (Signed)
Call placed to [facility] regarding order for CPAP, spoke to [place] who said that the patient informed them that he would call back for the order when he has insurance.

## 2020-10-10 ENCOUNTER — Other Ambulatory Visit: Payer: Self-pay

## 2020-10-17 ENCOUNTER — Emergency Department (HOSPITAL_BASED_OUTPATIENT_CLINIC_OR_DEPARTMENT_OTHER): Payer: Medicaid Other

## 2020-10-17 ENCOUNTER — Other Ambulatory Visit: Payer: Self-pay

## 2020-10-17 ENCOUNTER — Emergency Department (HOSPITAL_COMMUNITY): Payer: Medicaid Other

## 2020-10-17 ENCOUNTER — Emergency Department (HOSPITAL_COMMUNITY)
Admission: EM | Admit: 2020-10-17 | Discharge: 2020-10-17 | Disposition: A | Discharge status: Home / Self Care | Payer: Medicaid Other | Attending: Emergency Medicine | Admitting: Emergency Medicine

## 2020-10-17 DIAGNOSIS — Z8616 Personal history of COVID-19: Secondary | ICD-10-CM | POA: Insufficient documentation

## 2020-10-17 DIAGNOSIS — F17210 Nicotine dependence, cigarettes, uncomplicated: Secondary | ICD-10-CM | POA: Insufficient documentation

## 2020-10-17 DIAGNOSIS — N183 Chronic kidney disease, stage 3 unspecified: Secondary | ICD-10-CM | POA: Diagnosis not present

## 2020-10-17 DIAGNOSIS — J4521 Mild intermittent asthma with (acute) exacerbation: Secondary | ICD-10-CM | POA: Insufficient documentation

## 2020-10-17 DIAGNOSIS — R600 Localized edema: Secondary | ICD-10-CM | POA: Insufficient documentation

## 2020-10-17 DIAGNOSIS — Z79899 Other long term (current) drug therapy: Secondary | ICD-10-CM | POA: Insufficient documentation

## 2020-10-17 DIAGNOSIS — M79662 Pain in left lower leg: Secondary | ICD-10-CM | POA: Diagnosis not present

## 2020-10-17 DIAGNOSIS — M7989 Other specified soft tissue disorders: Secondary | ICD-10-CM

## 2020-10-17 DIAGNOSIS — I130 Hypertensive heart and chronic kidney disease with heart failure and stage 1 through stage 4 chronic kidney disease, or unspecified chronic kidney disease: Secondary | ICD-10-CM | POA: Insufficient documentation

## 2020-10-17 DIAGNOSIS — I5042 Chronic combined systolic (congestive) and diastolic (congestive) heart failure: Secondary | ICD-10-CM | POA: Insufficient documentation

## 2020-10-17 DIAGNOSIS — I10 Essential (primary) hypertension: Secondary | ICD-10-CM

## 2020-10-17 DIAGNOSIS — R0600 Dyspnea, unspecified: Secondary | ICD-10-CM | POA: Diagnosis present

## 2020-10-17 DIAGNOSIS — R609 Edema, unspecified: Secondary | ICD-10-CM

## 2020-10-17 DIAGNOSIS — Z7901 Long term (current) use of anticoagulants: Secondary | ICD-10-CM | POA: Insufficient documentation

## 2020-10-17 LAB — CBC WITH DIFFERENTIAL/PLATELET
Abs Immature Granulocytes: 0.07 10*3/uL (ref 0.00–0.07)
Basophils Absolute: 0.1 10*3/uL (ref 0.0–0.1)
Basophils Relative: 1 %
Eosinophils Absolute: 0.5 10*3/uL (ref 0.0–0.5)
Eosinophils Relative: 8 %
HCT: 50.1 % (ref 39.0–52.0)
Hemoglobin: 15.6 g/dL (ref 13.0–17.0)
Immature Granulocytes: 1 %
Lymphocytes Relative: 29 %
Lymphs Abs: 2 10*3/uL (ref 0.7–4.0)
MCH: 29.4 pg (ref 26.0–34.0)
MCHC: 31.1 g/dL (ref 30.0–36.0)
MCV: 94.4 fL (ref 80.0–100.0)
Monocytes Absolute: 0.7 10*3/uL (ref 0.1–1.0)
Monocytes Relative: 10 %
Neutro Abs: 3.6 10*3/uL (ref 1.7–7.7)
Neutrophils Relative %: 51 %
Platelets: 243 10*3/uL (ref 150–400)
RBC: 5.31 MIL/uL (ref 4.22–5.81)
RDW: 14.6 % (ref 11.5–15.5)
WBC: 6.9 10*3/uL (ref 4.0–10.5)
nRBC: 0 % (ref 0.0–0.2)

## 2020-10-17 LAB — COMPREHENSIVE METABOLIC PANEL
ALT: 25 U/L (ref 0–44)
AST: 22 U/L (ref 15–41)
Albumin: 3.3 g/dL — ABNORMAL LOW (ref 3.5–5.0)
Alkaline Phosphatase: 70 U/L (ref 38–126)
Anion gap: 8 (ref 5–15)
BUN: 12 mg/dL (ref 8–23)
CO2: 29 mmol/L (ref 22–32)
Calcium: 8.7 mg/dL — ABNORMAL LOW (ref 8.9–10.3)
Chloride: 102 mmol/L (ref 98–111)
Creatinine, Ser: 1.22 mg/dL (ref 0.61–1.24)
GFR, Estimated: >60 mL/min (ref >60)
Glucose, Bld: 115 mg/dL — ABNORMAL HIGH (ref 70–99)
Potassium: 4.3 mmol/L (ref 3.5–5.1)
Sodium: 139 mmol/L (ref 135–145)
Total Bilirubin: 0.6 mg/dL (ref 0.3–1.2)
Total Protein: 6.3 g/dL — ABNORMAL LOW (ref 6.5–8.1)

## 2020-10-17 LAB — BRAIN NATRIURETIC PEPTIDE: B Natriuretic Peptide: 17.1 pg/mL (ref 0.0–100.0)

## 2020-10-17 MED ORDER — ISOSORBIDE DINITRATE 20 MG PO TABS: ORAL_TABLET | ORAL | 1 refills | Status: DC

## 2020-10-17 MED ORDER — IPRATROPIUM BROMIDE 0.02 % IN SOLN
0.5000 mg | Freq: Once | RESPIRATORY_TRACT | Status: AC
  Administered 2020-10-17: 0.5 mg via RESPIRATORY_TRACT
  Filled 2020-10-17: qty 2.5

## 2020-10-17 MED ORDER — ALBUTEROL SULFATE HFA 108 (90 BASE) MCG/ACT IN AERS: 2.0000 | INHALATION_SPRAY | RESPIRATORY_TRACT | 1 refills | Status: DC | PRN

## 2020-10-17 MED ORDER — METHYLPREDNISOLONE 4 MG PO TBPK: ORAL_TABLET | ORAL | 0 refills | Status: DC

## 2020-10-17 MED ORDER — ALBUTEROL SULFATE (2.5 MG/3ML) 0.083% IN NEBU
5.0000 mg | INHALATION_SOLUTION | Freq: Once | RESPIRATORY_TRACT | Status: AC
  Administered 2020-10-17: 5 mg via RESPIRATORY_TRACT
  Filled 2020-10-17: qty 6

## 2020-10-17 NOTE — Discharge Instructions (Addendum)
Get help right away if: Your peak flow reading is less than 50% of your personal best. This is in the red zone, which means "danger." You have trouble breathing. You develop chest pain or discomfort. Your medicines no longer seem to be helping. You are coughing up bloody mucus. You have a fever and your symptoms suddenly get worse. You have trouble swallowing. You feel very tired, and breathing becomes tiring.

## 2020-10-17 NOTE — ED Triage Notes (Signed)
Pt reports waking up with LLE pain and swelling from knee down x 4 days. Denies long trips. Sts he can feel his knee popping when he walks.  Also reports right eye swelling since yesterday, denies injury.

## 2020-10-17 NOTE — ED Provider Notes (Signed)
[facility] EMERGENCY DEPARTMENT Provider Note   CSN: [id number] Arrival date & time: 10/17/20  0753     History Chief Complaint  Patient presents with   Leg Swelling    Zachary Hawkins is a 65 y.o. male with a pmh of obesity, ckd stage 3, dialated cardiomyopathty, medication non-compliance, PAFIB on eliquis who present with c/o peripheral edema.  Patient states that he noticed swelling in both of his legs however worse on the left side with associated tightness, heaviness and tenderness of the left lower extremity.  He is also noticed that his knee feels like it is clicking and catching.  Is never had this before.  He is on Eliquis chronically and states that he has been compliant with his medication.  [facility] and has not been able to get his isosorbide dinitrate tablets for the past 3 months.  The patient states that his swelling began about 3 days ago.  Since that time he has also had swelling of his face and increased exertional dyspnea.  He felt like this might be due to his asthma and has been using in his inhaler without significant improvement.  He denies any fevers, chills, nausea, vomiting.  He denies orthopnea or PND states that he only sleeps on his side.  He denies any known weight gain but states that he does not regularly weigh himself and denies swelling in his genitals.  The patient states that he has been otherwise compliant with all medications.  The history is provided by the patient and medical records. No language interpreter was used.      Past Medical History:  Diagnosis Date   Cardiomyopathy (HCC) 06/2017   EF 40-45%   Chronic renal insufficiency, stage 3 (moderate) (HCC)    Diastolic dysfunction 06/2017   grade 2    Hypertension    Hypertensive urgency    Obesity     Patient Active Problem List   Diagnosis Date Noted   Influenza vaccine refused 06/26/2020   COVID-19 vaccine series completed 06/26/2020    Paroxysmal atrial fibrillation (HCC) 07/02/2019   Secondary hypercoagulable state (HCC) 07/02/2019   OSA (obstructive sleep apnea) 04/03/2019   History of angioedema due to ACE INHIBITORS 10/13/2017   Non compliance w medication regimen 07/13/2017   Systolic and diastolic CHF, chronic (HCC) 06/23/2017   Uncontrolled hypertension 06/23/2017   Obesity with serious comorbidity    CKD (chronic kidney disease) stage 3, GFR 30-59 ml/min (HCC)    Dilated cardiomyopathy (HCC) 06/06/2017   Dyspnea 06/05/2017    No past surgical history on file.     Family History  Problem Relation Age of Onset   Diabetes Mother    Diabetes Father     Social History   Tobacco Use   Smoking status: Every Day    Packs/day: 1.00    Pack years: 0.00    Types: Cigarettes   Smokeless tobacco: Never   Tobacco comments:    1 pack last 2-3 days  Substance Use Topics   Alcohol use: Yes    Alcohol/week: 1.0 standard drink    Types: 1 Shots of liquor per week    Comment: socially   Drug use: Yes    Types: Marijuana    Comment: every other week    Home Medications Prior to Admission medications   Medication Sig Start Date End Date Taking? Authorizing Provider  albuterol (VENTOLIN HFA) 108 (90 Base) MCG/ACT inhaler Inhale 2 puffs  into the lungs every 6 (six) hours as needed for wheezing or shortness of breath (Shortness of breath). 04/18/19  Yes Storm Frisk, MD  albuterol (VENTOLIN HFA) 108 (90 Base) MCG/ACT inhaler Inhale 2 puffs into the lungs every 4 (four) hours as needed for wheezing or shortness of breath. 10/17/20  Yes Jo Booze, PA-C  amLODipine (NORVASC) 10 MG tablet TAKE 1 TABLET (10 MG TOTAL) BY MOUTH DAILY. Patient taking differently: Take 10 mg by mouth daily. 11/06/19 11/05/20 Yes Kilroy, Eda Paschal, PA-C  apixaban (ELIQUIS) 5 MG TABS tablet Take 1 tablet (5 mg total) by mouth 2 (two) times daily. 06/02/20 10/17/20 Yes Fenton, Clint R, PA  atorvastatin (LIPITOR) 10 MG tablet TAKE 1 TABLET  (10 MG TOTAL) BY MOUTH DAILY. Patient taking differently: Take 10 mg by mouth daily. 11/06/19 11/05/20 Yes Kilroy, Luke K, PA-C  carvedilol (COREG) 3.125 MG tablet TAKE 1 TABLET (3.125 MG TOTAL) BY MOUTH 2 (TWO) TIMES DAILY WITH A MEAL. Patient taking differently: Take 3.125 mg by mouth 2 (two) times daily with a meal. 11/06/19 11/05/20 Yes Kilroy, Luke K, PA-C  fluticasone (FLONASE) 50 MCG/ACT nasal spray PLACE 2 SPRAYS INTO BOTH NOSTRILS DAILY. 06/26/20 06/26/21 Yes Marcine Matar, MD  furosemide (LASIX) 40 MG tablet TAKE 1 TABLET (40 MG TOTAL) BY MOUTH DAILY. Patient taking differently: Take 40 mg by mouth daily. 11/06/19 11/05/20 Yes Kilroy, Luke K, PA-C  hydrALAZINE (APRESOLINE) 50 MG tablet TAKE 1 TABLET (50 MG TOTAL) BY MOUTH 3 (THREE) TIMES DAILY. Patient taking differently: Take 50 mg by mouth 3 (three) times daily. 11/06/19 11/05/20 Yes Kilroy, Eda Paschal, PA-C  methylPREDNISolone (MEDROL DOSEPAK) 4 MG TBPK tablet Use as directed 10/17/20  Yes Kamuela Magos, PA-C  sodium chloride (OCEAN) 0.65 % SOLN nasal spray Place 2 sprays into both nostrils 3 (three) times daily. 04/18/19  Yes Storm Frisk, MD  ADVAIR [facility] 115-21 MCG/ACT inhaler INHALE 2 PUFFS INTO THE LUNGS 2 (TWO) TIMES DAILY. Patient not taking: No sig reported 07/23/20   Hoy Register, MD  amoxicillin (AMOXIL) 500 MG capsule Take 1 capsule (500 mg total) by mouth 3 (three) times daily. Patient not taking: No sig reported 06/26/20   Marcine Matar, MD  amoxicillin-clavulanate (AUGMENTIN) 500-125 MG tablet TAKE 1 TABLET (500 MG TOTAL) BY MOUTH 2 (TWO) TIMES DAILY WITH A MEAL. Patient not taking: No sig reported 07/14/20 07/14/21  Marcine Matar, MD  isosorbide dinitrate (ISORDIL) 20 MG tablet TAKE 1 TABLET (20 MG TOTAL) BY MOUTH 3 (THREE) TIMES DAILY. SCHEDULE OFFICE VISIT 10/17/20 10/17/21  Arthor Captain, PA-C  loratadine (CLARITIN) 10 MG tablet TAKE 1 TABLET (10 MG TOTAL) BY MOUTH DAILY. Patient not taking: No sig reported 06/26/20  06/26/21  Marcine Matar, MD  predniSONE (DELTASONE) 20 MG tablet TAKE 3 TABS EVERY MORNING WITH BREAKFAST FOR 5 DAYS, THEN 2 TABS EVERY MORNING FOR 5 DAYS, THEN 1 TAB EVERY MORNING FOR 15 DAYS Patient not taking: No sig reported 07/14/20 07/14/21  Marcine Matar, MD    Allergies    Ace inhibitors and Black cherry fruit extract [name]]  Review of Systems   Review of Systems Ten systems reviewed and are negative for acute change, except as noted in the HPI.   Physical Exam Updated Vital Signs BP (!) 137/99   Pulse 69   Temp 99.2 F (37.3 C) (Oral)   Resp 19   SpO2 92%   Physical Exam Vitals and nursing note reviewed.  Constitutional:  General: He is not in acute distress.    Appearance: He is well-developed. He is obese. He is not diaphoretic.  HENT:     Head: Normocephalic and atraumatic.     Mouth/Throat:     Mouth: Mucous membranes are moist.  Eyes:     General: No scleral icterus.    Extraocular Movements: Extraocular movements intact.     Conjunctiva/sclera: Conjunctivae normal.     Pupils: Pupils are equal, round, and reactive to light.  Cardiovascular:     Rate and Rhythm: Normal rate and regular rhythm.     Heart sounds: Normal heart sounds.  Pulmonary:     Effort: Pulmonary effort is normal. No respiratory distress.     Breath sounds: Wheezing present.     Comments: Unable to hear breath sounds on exam- ? Body habitus Abdominal:     General: Abdomen is protuberant.     Palpations: Abdomen is soft.     Tenderness: There is no abdominal tenderness.  Musculoskeletal:     Cervical back: Normal range of motion and neck supple.     Right lower leg: 1+ Edema present.     Left lower leg: 2+ Edema present.  Skin:    General: Skin is warm and dry.  Neurological:     Mental Status: He is alert.  Psychiatric:        Behavior: Behavior normal.    ED Results / Procedures / Treatments   Labs (all labs ordered are listed, but only abnormal results  are displayed) Labs Reviewed  COMPREHENSIVE METABOLIC PANEL - Abnormal; Notable for the following components:      Result Value   Glucose, Bld 115 (*)    Calcium 8.7 (*)    Total Protein 6.3 (*)    Albumin 3.3 (*)    All other components within normal limits  CBC WITH DIFFERENTIAL/PLATELET  BRAIN NATRIURETIC PEPTIDE    EKG None  Radiology DG Chest 2 View  Result Date: 10/17/2020 CLINICAL DATA:  Shortness of breath EXAM: CHEST - 2 VIEW COMPARISON:  08/02/2019 FINDINGS: Normal heart size and mediastinal contours. No acute infiltrate or edema. No effusion or pneumothorax. No acute osseous findings. IMPRESSION: Stable exam.  No acute finding. Electronically Signed   By: Marnee SpringJonathon  Watts M.D.   On: 10/17/2020 09:20   DG Knee Complete 4 Views Left  Result Date: 10/17/2020 CLINICAL DATA:  Left lower extremity pain EXAM: LEFT KNEE - COMPLETE 4+ VIEW COMPARISON:  None. FINDINGS: No evidence of fracture, dislocation, or joint effusion. 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 Mild atherosclerotic calcification. Anterior soft tissue swelling. IMPRESSION: No acute osseous finding or degenerative joint narrowing Electronically Signed   By: Marnee SpringJonathon  Watts M.D.   On: 10/17/2020 09:22   VAS US LOWER EXTREMITY VENOUS (DVT) ([facility] and [facility] 7a-7p)  Result Date: 10/17/2020  Lower Venous DVT Study Patient Name:  Dorena DewVINSCENT L Angerer  Date of Exam:   10/17/2020 Medical Rec #: [id number]        Accession #:    [id number] Date of Birth: 1956/03/21        Patient Gender: M Patient Age:   64Y [facility] Procedure:      VAS US LOWER EXTREMITY VENOUS (DVT) Referring Phys: [id number] Maebry Obrien --------------------------------------------------------------------------------  Indications: Left calf pain and swelling x4 days.  Limitations: Body habitus and poor ultrasound/tissue interface. Comparison Study: No prior study Performing Technologist: Gertie FeyMichelle  Simonetti MHA, RDMS, RVT,  RDCS  Examination Guidelines: A complete evaluation includes B-mode imaging, spectral Doppler, color Doppler, and power Doppler as needed of all accessible portions of each vessel. Bilateral testing is considered an integral part of a complete examination. Limited examinations for reoccurring indications may be performed as noted. The reflux portion of the exam is performed with the patient in reverse Trendelenburg.  +-----+---------------+---------+-----------+----------+--------------+ RIGHTCompressibilityPhasicitySpontaneityPropertiesThrombus Aging +-----+---------------+---------+-----------+----------+--------------+ CFV  Full           Yes      Yes                                 +-----+---------------+---------+-----------+----------+--------------+   +---------+---------------+---------+-----------+----------+--------------+ LEFT     CompressibilityPhasicitySpontaneityPropertiesThrombus Aging +---------+---------------+---------+-----------+----------+--------------+ CFV      Full           Yes      Yes                                 +---------+---------------+---------+-----------+----------+--------------+ SFJ      Full                                                        +---------+---------------+---------+-----------+----------+--------------+ FV Prox  Full                                                        +---------+---------------+---------+-----------+----------+--------------+ FV Mid   Full                                                        +---------+---------------+---------+-----------+----------+--------------+ FV DistalFull                                                        +---------+---------------+---------+-----------+----------+--------------+ POP      Full           Yes      Yes                                 +---------+---------------+---------+-----------+----------+--------------+  PTV      Full                                                        +---------+---------------+---------+-----------+----------+--------------+ PERO     Full                                                        +---------+---------------+---------+-----------+----------+--------------+  Left Technical Findings: Not visualized segments include PFV.   Summary: RIGHT: - No evidence of common femoral vein obstruction.  LEFT: - There is no evidence of deep vein thrombosis in the lower extremity. However, portions of this examination were limited- see technologist comments above.  - No cystic structure found in the popliteal fossa.  *See table(s) above for measurements and observations.    Preliminary     Procedures Procedures   Medications Ordered in ED Medications  albuterol (PROVENTIL) (2.5 MG/3ML) 0.083% nebulizer solution 5 mg (5 mg Nebulization Given 10/17/20 1046)  ipratropium (ATROVENT) nebulizer solution 0.5 mg (0.5 mg Nebulization Given 10/17/20 1046)    ED Course  I have reviewed the triage vital signs and the nursing notes.  Pertinent labs & imaging results that were available during my care of the patient were reviewed by me and considered in my medical decision making (see chart for details).  Clinical Course as of 10/17/20 1455  Fri Oct 17, 2020  0831 Pt here with swelling- sob. Clinically the patient appears to have volume overload, however, L>R LE edema? DVT. This is however less likely on his Eliquis and I have low suspicion for PE. I also have concern for potential worsening HF and CKD. Less likely Hypoalbuminemia and liver disease. [AH]    Clinical Course User Index [AH] Arthor Captain, PA-C   MDM Rules/Calculators/A&P                         Patient here with complaint of shortness of breath The emergent differential diagnosis for shortness of breath includes, but is not limited to, Pulmonary edema, bronchoconstriction, Pneumonia, Pulmonary embolism,  Pneumotherax/ Hemothorax, Dysrythmia, ACS.  I ordered and reviewed labs include CBC and BMP both within normal limits.  I doubt false negative BNP due to obesity.  CMP with mildly elevated glucose, mildly low calcium and protein levels both of insignificant value.  I ordered and reviewed a vascular ultrasound of the lower extremity a 2 view chest and a 4 view of the knee all of which show no acute on normalities including pulmonary edema osteoarthritis or a blood clot of the left lower extremity.  EKG shows sinus rhythm at a rate of 80, although EKG interpretation shows ST elevation I believe this is secondary to artifact to be from poor baseline quality.  Patient does not have any active chest pain. After reviewing all data points I suspect that ultimately the patient was having an asthma exacerbation and was more likely the reason was unable to hear breath sounds well.  Patient was given a DuoNeb with significant improvement in his breathing status and was able to ambulate without dropping oxygen saturations below 93%.  Given this I will discharge the patient with a Medrol Dosepak a refill of his regular medications and an albuterol inhaler.  Patient he has a new patient appointment with a primary care doctor early next month.  Patient appears otherwise appropriate for discharge at this time.  He does admit to recently eating a lot more ham which was very salty and likely the reason that his legs are swollen.  Discussed reducing his salt intake    Final Clinical Impression(s) / ED Diagnoses Final diagnoses:  Essential hypertension  Intermittent asthma with acute exacerbation, unspecified asthma severity    Rx / DC Orders ED Discharge Orders          Ordered    albuterol (VENTOLIN HFA) 108 (90 Base) MCG/ACT inhaler  Every 4 hours PRN        10/17/20 1230    isosorbide dinitrate (ISORDIL) 20 MG tablet        10/17/20 1230    methylPREDNISolone (MEDROL DOSEPAK) 4 MG TBPK tablet        10/17/20  1230             Arthor Captain, PA-C 10/17/20 1457    Benjiman Core, MD 10/17/20 1552

## 2020-10-17 NOTE — ED Notes (Addendum)
Ambulated pt around room. O2 sat remained between 93-97% the entire time. PA aware.

## 2020-10-17 NOTE — Progress Notes (Signed)
Left lower extremity venous duplex completed. Refer to "CV Proc" under chart review to view preliminary results.  10/17/2020 9:38 AM Eula Fried., MHA, RVT, RDCS, RDMS

## 2020-12-15 DIAGNOSIS — N183 Chronic kidney disease, stage 3 unspecified: Secondary | ICD-10-CM | POA: Diagnosis not present

## 2020-12-15 DIAGNOSIS — Z79899 Other long term (current) drug therapy: Secondary | ICD-10-CM | POA: Diagnosis not present

## 2020-12-15 DIAGNOSIS — F17210 Nicotine dependence, cigarettes, uncomplicated: Secondary | ICD-10-CM | POA: Diagnosis not present

## 2020-12-15 DIAGNOSIS — R0602 Shortness of breath: Secondary | ICD-10-CM | POA: Insufficient documentation

## 2020-12-15 DIAGNOSIS — I130 Hypertensive heart and chronic kidney disease with heart failure and stage 1 through stage 4 chronic kidney disease, or unspecified chronic kidney disease: Secondary | ICD-10-CM | POA: Diagnosis not present

## 2020-12-15 DIAGNOSIS — Z7901 Long term (current) use of anticoagulants: Secondary | ICD-10-CM | POA: Diagnosis not present

## 2020-12-15 DIAGNOSIS — I5042 Chronic combined systolic (congestive) and diastolic (congestive) heart failure: Secondary | ICD-10-CM | POA: Diagnosis not present

## 2020-12-16 ENCOUNTER — Other Ambulatory Visit: Payer: Self-pay

## 2020-12-16 ENCOUNTER — Emergency Department (HOSPITAL_COMMUNITY)
Admission: EM | Admit: 2020-12-16 | Discharge: 2020-12-16 | Disposition: A | Discharge status: Home / Self Care | Payer: Medicaid Other | Attending: Emergency Medicine | Admitting: Emergency Medicine

## 2020-12-16 ENCOUNTER — Emergency Department (HOSPITAL_COMMUNITY): Payer: Medicaid Other

## 2020-12-16 DIAGNOSIS — R0602 Shortness of breath: Secondary | ICD-10-CM

## 2020-12-16 LAB — BASIC METABOLIC PANEL
Anion gap: 9 (ref 5–15)
BUN: 22 mg/dL (ref 8–23)
CO2: 26 mmol/L (ref 22–32)
Calcium: 8.9 mg/dL (ref 8.9–10.3)
Chloride: 103 mmol/L (ref 98–111)
Creatinine, Ser: 1.54 mg/dL — ABNORMAL HIGH (ref 0.61–1.24)
GFR, Estimated: 51 mL/min — ABNORMAL LOW (ref >60)
Glucose, Bld: 118 mg/dL — ABNORMAL HIGH (ref 70–99)
Potassium: 4.3 mmol/L (ref 3.5–5.1)
Sodium: 138 mmol/L (ref 135–145)

## 2020-12-16 LAB — CBC WITH DIFFERENTIAL/PLATELET
Abs Immature Granulocytes: 0.12 10*3/uL — ABNORMAL HIGH (ref 0.00–0.07)
Basophils Absolute: 0.1 10*3/uL (ref 0.0–0.1)
Basophils Relative: 2 %
Eosinophils Absolute: 0.7 10*3/uL — ABNORMAL HIGH (ref 0.0–0.5)
Eosinophils Relative: 9 %
HCT: 50.1 % (ref 39.0–52.0)
Hemoglobin: 16 g/dL (ref 13.0–17.0)
Immature Granulocytes: 2 %
Lymphocytes Relative: 38 %
Lymphs Abs: 3.1 10*3/uL (ref 0.7–4.0)
MCH: 29.6 pg (ref 26.0–34.0)
MCHC: 31.9 g/dL (ref 30.0–36.0)
MCV: 92.8 fL (ref 80.0–100.0)
Monocytes Absolute: 0.9 10*3/uL (ref 0.1–1.0)
Monocytes Relative: 11 %
Neutro Abs: 3.1 10*3/uL (ref 1.7–7.7)
Neutrophils Relative %: 38 %
Platelets: 233 10*3/uL (ref 150–400)
RBC: 5.4 MIL/uL (ref 4.22–5.81)
RDW: 14.7 % (ref 11.5–15.5)
WBC: 8 10*3/uL (ref 4.0–10.5)
nRBC: 0 % (ref 0.0–0.2)

## 2020-12-16 LAB — TROPONIN I (HIGH SENSITIVITY)
Troponin I (High Sensitivity): 24 ng/L — ABNORMAL HIGH (ref <18)
Troponin I (High Sensitivity): 22 ng/L — ABNORMAL HIGH (ref <18)

## 2020-12-16 LAB — BRAIN NATRIURETIC PEPTIDE: B Natriuretic Peptide: 29.6 pg/mL (ref 0.0–100.0)

## 2020-12-16 LAB — PROTIME-INR
INR: 1 (ref 0.8–1.2)
Prothrombin Time: 13.3 seconds (ref 11.4–15.2)

## 2020-12-16 MED ORDER — METHYLPREDNISOLONE SODIUM SUCC 125 MG IJ SOLR
60.0000 mg | Freq: Once | INTRAMUSCULAR | Status: AC
  Administered 2020-12-16: 60 mg via INTRAMUSCULAR
  Filled 2020-12-16: qty 2

## 2020-12-16 MED ORDER — FUROSEMIDE 20 MG PO TABS
40.0000 mg | ORAL_TABLET | Freq: Once | ORAL | Status: AC
  Administered 2020-12-16: 40 mg via ORAL
  Filled 2020-12-16: qty 2

## 2020-12-16 MED ORDER — PREDNISONE 20 MG PO TABS: 20.0000 mg | ORAL_TABLET | Freq: Two times a day (BID) | ORAL | 0 refills | Status: AC

## 2020-12-16 MED ORDER — ALBUTEROL SULFATE HFA 108 (90 BASE) MCG/ACT IN AERS
2.0000 | INHALATION_SPRAY | Freq: Once | RESPIRATORY_TRACT | Status: AC
  Administered 2020-12-16: 2 via RESPIRATORY_TRACT
  Filled 2020-12-16: qty 6.7

## 2020-12-16 NOTE — ED Provider Notes (Signed)
[facility] EMERGENCY DEPARTMENT Provider Note   CSN: [id number] Arrival date & time: 12/15/20  2355     History Chief Complaint  Patient presents with   Shortness of Breath    Zachary Hawkins is a 65 y.o. male.  Patient with history of congestive heart failure, hypertension, reactive airway disease, presents with worsening shortness of breath and wheeze since last night.  He states he ran out of his albuterol inhaler yesterday.  He states his symptoms are worse when he tries to lay flat.  Denies fevers or cough or vomiting or diarrhea.  Denies chest pain or abdominal pain.      Past Medical History:  Diagnosis Date   Cardiomyopathy (HCC) 06/2017   EF 40-45%   Chronic renal insufficiency, stage 3 (moderate) (HCC)    Diastolic dysfunction 06/2017   grade 2    Hypertension    Hypertensive urgency    Obesity     Patient Active Problem List   Diagnosis Date Noted   Influenza vaccine refused 06/26/2020   COVID-19 vaccine series completed 06/26/2020   Paroxysmal atrial fibrillation (HCC) 07/02/2019   Secondary hypercoagulable state (HCC) 07/02/2019   OSA (obstructive sleep apnea) 04/03/2019   History of angioedema due to ACE INHIBITORS 10/13/2017   Non compliance w medication regimen 07/13/2017   Systolic and diastolic CHF, chronic (HCC) 06/23/2017   Uncontrolled hypertension 06/23/2017   Obesity with serious comorbidity    CKD (chronic kidney disease) stage 3, GFR 30-59 ml/min (HCC)    Dilated cardiomyopathy (HCC) 06/06/2017   Dyspnea 06/05/2017    No past surgical history on file.     Family History  Problem Relation Age of Onset   Diabetes Mother    Diabetes Father     Social History   Tobacco Use   Smoking status: Every Day    Packs/day: 1.00    Types: Cigarettes   Smokeless tobacco: Never   Tobacco comments:    1 pack last 2-3 days  Substance Use Topics   Alcohol use: Yes    Alcohol/week: 1.0 standard drink    Types: 1 Shots of  liquor per week    Comment: socially   Drug use: Yes    Types: Marijuana    Comment: every other week    Home Medications Prior to Admission medications   Medication Sig Start Date End Date Taking? Authorizing Provider  ADVAIR HFA 115-21 MCG/ACT inhaler INHALE 2 PUFFS INTO THE LUNGS 2 (TWO) TIMES DAILY. 07/23/20  Yes Hoy Register, MD  albuterol (VENTOLIN HFA) 108 (90 Base) MCG/ACT inhaler Inhale 2 puffs into the lungs every 4 (four) hours as needed for wheezing or shortness of breath. 10/17/20  Yes Harris, Abigail, PA-C  amLODipine (NORVASC) 10 MG tablet TAKE 1 TABLET (10 MG TOTAL) BY MOUTH DAILY. Patient taking differently: Take 10 mg by mouth daily. 11/06/19 12/16/20 Yes Kilroy, Eda Paschal, PA-C  apixaban (ELIQUIS) 5 MG TABS tablet Take 1 tablet (5 mg total) by mouth 2 (two) times daily. 06/02/20 12/16/20 Yes Fenton, Clint R, PA  atorvastatin (LIPITOR) 10 MG tablet TAKE 1 TABLET (10 MG TOTAL) BY MOUTH DAILY. Patient taking differently: Take 10 mg by mouth daily. 11/06/19 12/16/20 Yes Kilroy, Luke K, PA-C  carvedilol (COREG) 3.125 MG tablet TAKE 1 TABLET (3.125 MG TOTAL) BY MOUTH 2 (TWO) TIMES DAILY WITH A MEAL. Patient taking differently: Take 3.125 mg by mouth 2 (two) times daily with a meal. 11/06/19 12/16/20 Yes Kilroy, Luke K, PA-C  fluticasone (  FLONASE) 50 MCG/ACT nasal spray PLACE 2 SPRAYS INTO BOTH NOSTRILS DAILY. Patient taking differently: Place 2 sprays into both nostrils daily as needed for allergies. 06/26/20 06/26/21 Yes Marcine Matar, MD  furosemide (LASIX) 40 MG tablet TAKE 1 TABLET (40 MG TOTAL) BY MOUTH DAILY. Patient taking differently: Take 40 mg by mouth daily. 11/06/19 12/16/20 Yes Kilroy, Luke K, PA-C  isosorbide dinitrate (ISORDIL) 20 MG tablet TAKE 1 TABLET (20 MG TOTAL) BY MOUTH 3 (THREE) TIMES DAILY. SCHEDULE OFFICE VISIT Patient taking differently: Take 20 mg by mouth 3 (three) times daily. 10/17/20 10/17/21 Yes Harris, Abigail, PA-C  sodium chloride (OCEAN) 0.65 % SOLN nasal  spray Place 2 sprays into both nostrils 3 (three) times daily. Patient taking differently: Place 2 sprays into both nostrils as needed for congestion. 04/18/19  Yes Storm Frisk, MD  albuterol (VENTOLIN HFA) 108 (90 Base) MCG/ACT inhaler Inhale 2 puffs into the lungs every 6 (six) hours as needed for wheezing or shortness of breath (Shortness of breath). Patient not taking: Reported on 12/16/2020 04/18/19   Storm Frisk, MD  amoxicillin (AMOXIL) 500 MG capsule Take 1 capsule (500 mg total) by mouth 3 (three) times daily. Patient not taking: No sig reported 06/26/20   Marcine Matar, MD  amoxicillin-clavulanate (AUGMENTIN) 500-125 MG tablet TAKE 1 TABLET (500 MG TOTAL) BY MOUTH 2 (TWO) TIMES DAILY WITH A MEAL. Patient not taking: No sig reported 07/14/20 07/14/21  Marcine Matar, MD  hydrALAZINE (APRESOLINE) 50 MG tablet TAKE 1 TABLET (50 MG TOTAL) BY MOUTH 3 (THREE) TIMES DAILY. Patient taking differently: Take 50 mg by mouth 3 (three) times daily. 11/06/19 11/05/20  Abelino Derrick, PA-C  loratadine (CLARITIN) 10 MG tablet TAKE 1 TABLET (10 MG TOTAL) BY MOUTH DAILY. Patient not taking: No sig reported 06/26/20 06/26/21  Marcine Matar, MD  methylPREDNISolone (MEDROL DOSEPAK) 4 MG TBPK tablet Use as directed Patient not taking: No sig reported 10/17/20   Arthor Captain, PA-C  predniSONE (DELTASONE) 20 MG tablet TAKE 3 TABS EVERY MORNING WITH BREAKFAST FOR 5 DAYS, THEN 2 TABS EVERY MORNING FOR 5 DAYS, THEN 1 TAB EVERY MORNING FOR 15 DAYS Patient not taking: No sig reported 07/14/20 07/14/21  Marcine Matar, MD    Allergies    Ace inhibitors and Black cherry fruit extract [name]]  Review of Systems   Review of Systems  Constitutional:  Negative for fever.  HENT:  Negative for ear pain and sore throat.   Eyes:  Negative for pain.  Respiratory:  Positive for shortness of breath. Negative for cough.   Cardiovascular:  Negative for chest pain.  Gastrointestinal:  Negative for  abdominal pain.  Genitourinary:  Negative for flank pain.  Musculoskeletal:  Negative for back pain.  Skin:  Negative for color change and rash.  Neurological:  Negative for syncope.  All other systems reviewed and are negative.  Physical Exam Updated Vital Signs BP 114/79   Pulse 64   Temp 98 F (36.7 C)   Resp 18   Ht 5\' 9"  (1.753 m)   Wt (!) 139.7 kg   SpO2 97%   BMI 45.48 kg/m   Physical Exam Constitutional:      Appearance: He is well-developed.     Comments: Large abdominal girth.  HENT:     Head: Normocephalic.     Nose: Nose normal.  Eyes:     Extraocular Movements: Extraocular movements intact.  Cardiovascular:     Rate and Rhythm: Normal  rate.  Pulmonary:     Effort: Pulmonary effort is normal.     Breath sounds: Decreased breath sounds present. No wheezing.  Abdominal:     Comments: Large abdominal girth.  Nontender no guarding or rebound.  Skin:    Coloration: Skin is not jaundiced.  Neurological:     Mental Status: He is alert. Mental status is at baseline.    ED Results / Procedures / Treatments   Labs (all labs ordered are listed, but only abnormal results are displayed) Labs Reviewed  BASIC METABOLIC PANEL - Abnormal; Notable for the following components:      Result Value   Glucose, Bld 118 (*)    Creatinine, Ser 1.54 (*)    GFR, Estimated 51 (*)    All other components within normal limits  CBC WITH DIFFERENTIAL/PLATELET - Abnormal; Notable for the following components:   Eosinophils Absolute 0.7 (*)    Abs Immature Granulocytes 0.12 (*)    All other components within normal limits  TROPONIN I (HIGH SENSITIVITY) - Abnormal; Notable for the following components:   Troponin I (High Sensitivity) 24 (*)    All other components within normal limits  TROPONIN I (HIGH SENSITIVITY) - Abnormal; Notable for the following components:   Troponin I (High Sensitivity) 22 (*)    All other components within normal limits  BRAIN NATRIURETIC PEPTIDE   PROTIME-INR    EKG EKG Interpretation  Date/Time:  Tuesday December 16 2020 00:11:54 EDT Ventricular Rate:  92 PR Interval:    QRS Duration: 92 QT Interval:  338 QTC Calculation: 417 R Axis:   11 Text Interpretation: Atrial fibrillation Abnormal ECG When compared with ECG of 10/17/2020, Atrial fibrillation has replaced Sinus rhythm Confirmed by Dione Booze ([id number]) on 12/16/2020 5:16:11 AM  Radiology DG Chest 2 View  Result Date: 12/16/2020 CLINICAL DATA:  Shortness of breath, weight gain recently, bilateral lower extremity edema, history of CHF EXAM: CHEST - 2 VIEW COMPARISON:  Radiograph 05/19/2020 FINDINGS: Some slightly increasing hazy interstitial opacities are present throughout the lungs with few peripheral septal lines and fissural thickening. Pulmonary vascularity is cephalized and mildly indistinct. Enlarged cardiac silhouette is similar to comparison priors with a calcified aorta. No pneumothorax. No layering pleural effusion. No acute osseous or soft tissue abnormality. IMPRESSION: Features may reflect some mild interstitial edema on a background of chronic coarsened interstitial changes with additional features compatible with CHF in the appropriate clinical setting. Electronically Signed   By: Kreg Shropshire M.D.   On: 12/16/2020 01:03    Procedures Procedures   Medications Ordered in ED Medications  methylPREDNISolone sodium succinate (SOLU-MEDROL) 125 mg/2 mL injection 60 mg (has no administration in time range)  albuterol (VENTOLIN HFA) 108 (90 Base) MCG/ACT inhaler 2 puff (has no administration in time range)  furosemide (LASIX) tablet 40 mg (has no administration in time range)    ED Course  I have reviewed the triage vital signs and the nursing notes.  Pertinent labs & imaging results that were available during my care of the patient were reviewed by me and considered in my medical decision making (see chart for details).    MDM Rules/Calculators/A&P                            Chest x-ray shows some evidence of pulmonary edema.  proBNP is near normal levels.  No significant wheezes noted on exam, however patient has a large abdominal girth which presses up on  his lungs, particularly when he lays flat.  No pedal edema noted bilaterally.  Patient given albuterol breathing treatment as well as steroid injection.  His vital signs show heart rate of 85 bpm normal blood pressure and 97% on room air which is appropriate.  We discussed the patient being tested for COVID and staying for nebulized treatments, however he states he has an appointment this morning prefers to receive HFA inhaler.  I advised immediate return if his symptoms worsen, otherwise vital signs appear normalized and patient be discharged home in stable condition.  Final Clinical Impression(s) / ED Diagnoses Final diagnoses:  Shortness of breath    Rx / DC Orders ED Discharge Orders     None        Cheryll CockayneHong, Verneice Caspers S, MD 12/16/20 [phone]

## 2020-12-16 NOTE — Discharge Instructions (Addendum)
Call your primary care doctor or specialist as discussed in the next 2-3 days.   Return immediately back to the ER if:  Your symptoms worsen within the next 12-24 hours. You develop new symptoms such as new fevers, persistent vomiting, new pain, shortness of breath, or new weakness or numbness, or if you have any other concerns.  

## 2020-12-16 NOTE — ED Provider Notes (Signed)
Emergency Medicine Provider Triage Evaluation Note  Zachary Hawkins , a 65 y.o. male  was evaluated in triage.  Pt complains of SOB. Symptoms exacerbated when he went to lay down to bed tonight; +orthopnea. Tried an inhaler with little relief.  Thinks he may be up around 8 pounds from baseline.  Has some associated chest tightness.  No fevers.  Reports compliance with his Eliquis and Lasix.  Hx of asthma, HTN, PAF, cardiomyopathy, s/d CHF, stage III CKD, OSA, obesity.  Review of Systems  Positive: SOB, chest tightness, orthopnea Negative: Fever   Physical Exam  There were no vitals taken for this visit. Gen:   Awake, no distress, obese Resp:  Appears dyspneic. Tachypnea. Lungs grossly clear. Decreased in b/l bases. MSK:   Moves extremities without difficulty  Other:  1+ pitting edema BLE  Medical Decision Making  Medically screening exam initiated at 12:13 AM.  Appropriate orders placed.  Zachary Hawkins was informed that the remainder of the evaluation will be completed by another provider, this initial triage assessment does not replace that evaluation, and the importance of remaining in the ED until their evaluation is complete.  SOB   Antony Madura, PA-C 12/16/20 0017    Shon Baton, MD 12/16/20 [phone]

## 2020-12-16 NOTE — ED Triage Notes (Signed)
Pt was at home and had increased shob. Out of albuterol.  Approximately 8 lb weight gain recently. Some edema noted to BLE.  Pt has hx of CHF and states he is compliant with all of his medications.

## 2020-12-31 ENCOUNTER — Other Ambulatory Visit: Payer: Self-pay

## 2021-01-04 ENCOUNTER — Inpatient Hospital Stay (HOSPITAL_COMMUNITY): Payer: Medicaid Other | Admitting: Anesthesiology

## 2021-01-04 ENCOUNTER — Emergency Department (HOSPITAL_COMMUNITY): Payer: Medicaid Other

## 2021-01-04 ENCOUNTER — Encounter (HOSPITAL_COMMUNITY): Payer: Self-pay | Admitting: Emergency Medicine

## 2021-01-04 ENCOUNTER — Inpatient Hospital Stay (HOSPITAL_COMMUNITY)
Admission: EM | Admit: 2021-01-04 | Discharge: 2021-01-09 | DRG: 915 | Disposition: A | Discharge status: Home / Self Care | Payer: Medicaid Other | Attending: Internal Medicine | Admitting: Internal Medicine

## 2021-01-04 ENCOUNTER — Other Ambulatory Visit: Payer: Self-pay

## 2021-01-04 ENCOUNTER — Inpatient Hospital Stay (HOSPITAL_COMMUNITY): Payer: Medicaid Other

## 2021-01-04 DIAGNOSIS — R0981 Nasal congestion: Secondary | ICD-10-CM

## 2021-01-04 DIAGNOSIS — I482 Chronic atrial fibrillation, unspecified: Secondary | ICD-10-CM | POA: Diagnosis present

## 2021-01-04 DIAGNOSIS — J9601 Acute respiratory failure with hypoxia: Secondary | ICD-10-CM | POA: Diagnosis present

## 2021-01-04 DIAGNOSIS — F1210 Cannabis abuse, uncomplicated: Secondary | ICD-10-CM | POA: Diagnosis present

## 2021-01-04 DIAGNOSIS — I5043 Acute on chronic combined systolic (congestive) and diastolic (congestive) heart failure: Secondary | ICD-10-CM | POA: Diagnosis present

## 2021-01-04 DIAGNOSIS — I5042 Chronic combined systolic (congestive) and diastolic (congestive) heart failure: Secondary | ICD-10-CM

## 2021-01-04 DIAGNOSIS — N1831 Chronic kidney disease, stage 3a: Secondary | ICD-10-CM | POA: Diagnosis present

## 2021-01-04 DIAGNOSIS — G4733 Obstructive sleep apnea (adult) (pediatric): Secondary | ICD-10-CM | POA: Diagnosis present

## 2021-01-04 DIAGNOSIS — Z7901 Long term (current) use of anticoagulants: Secondary | ICD-10-CM

## 2021-01-04 DIAGNOSIS — Z0189 Encounter for other specified special examinations: Secondary | ICD-10-CM

## 2021-01-04 DIAGNOSIS — N400 Enlarged prostate without lower urinary tract symptoms: Secondary | ICD-10-CM | POA: Diagnosis present

## 2021-01-04 DIAGNOSIS — I130 Hypertensive heart and chronic kidney disease with heart failure and stage 1 through stage 4 chronic kidney disease, or unspecified chronic kidney disease: Secondary | ICD-10-CM | POA: Diagnosis present

## 2021-01-04 DIAGNOSIS — T7804XA Anaphylactic reaction due to fruits and vegetables, initial encounter: Secondary | ICD-10-CM | POA: Diagnosis present

## 2021-01-04 DIAGNOSIS — I480 Paroxysmal atrial fibrillation: Secondary | ICD-10-CM | POA: Diagnosis present

## 2021-01-04 DIAGNOSIS — K5900 Constipation, unspecified: Secondary | ICD-10-CM | POA: Diagnosis not present

## 2021-01-04 DIAGNOSIS — Z888 Allergy status to other drugs, medicaments and biological substances status: Secondary | ICD-10-CM | POA: Diagnosis not present

## 2021-01-04 DIAGNOSIS — Z79899 Other long term (current) drug therapy: Secondary | ICD-10-CM

## 2021-01-04 DIAGNOSIS — Z20822 Contact with and (suspected) exposure to covid-19: Secondary | ICD-10-CM | POA: Diagnosis present

## 2021-01-04 DIAGNOSIS — Z6841 Body Mass Index (BMI) 40.0 and over, adult: Secondary | ICD-10-CM

## 2021-01-04 DIAGNOSIS — Z4659 Encounter for fitting and adjustment of other gastrointestinal appliance and device: Secondary | ICD-10-CM | POA: Diagnosis not present

## 2021-01-04 DIAGNOSIS — T783XXA Angioneurotic edema, initial encounter: Principal | ICD-10-CM | POA: Diagnosis present

## 2021-01-04 DIAGNOSIS — R0600 Dyspnea, unspecified: Secondary | ICD-10-CM

## 2021-01-04 DIAGNOSIS — R140 Abdominal distension (gaseous): Secondary | ICD-10-CM | POA: Diagnosis not present

## 2021-01-04 DIAGNOSIS — Z7151 Drug abuse counseling and surveillance of drug abuser: Secondary | ICD-10-CM

## 2021-01-04 DIAGNOSIS — F1410 Cocaine abuse, uncomplicated: Secondary | ICD-10-CM | POA: Diagnosis present

## 2021-01-04 DIAGNOSIS — Z91018 Allergy to other foods: Secondary | ICD-10-CM

## 2021-01-04 DIAGNOSIS — I420 Dilated cardiomyopathy: Secondary | ICD-10-CM | POA: Diagnosis present

## 2021-01-04 DIAGNOSIS — F17210 Nicotine dependence, cigarettes, uncomplicated: Secondary | ICD-10-CM | POA: Diagnosis present

## 2021-01-04 DIAGNOSIS — E669 Obesity, unspecified: Secondary | ICD-10-CM | POA: Diagnosis present

## 2021-01-04 DIAGNOSIS — I5021 Acute systolic (congestive) heart failure: Secondary | ICD-10-CM | POA: Diagnosis not present

## 2021-01-04 LAB — I-STAT ARTERIAL BLOOD GAS, ED
Acid-Base Excess: 3 mmol/L — ABNORMAL HIGH (ref 0.0–2.0)
Bicarbonate: 29.8 mmol/L — ABNORMAL HIGH (ref 20.0–28.0)
Calcium, Ion: 1.26 mmol/L (ref 1.15–1.40)
HCT: 49 % (ref 39.0–52.0)
Hemoglobin: 16.7 g/dL (ref 13.0–17.0)
O2 Saturation: 96 %
Patient temperature: 98.6
Potassium: 4.5 mmol/L (ref 3.5–5.1)
Sodium: 141 mmol/L (ref 135–145)
TCO2: 31 mmol/L (ref 22–32)
pCO2 arterial: 54.1 mmHg — ABNORMAL HIGH (ref 32.0–48.0)
pH, Arterial: 7.348 — ABNORMAL LOW (ref 7.350–7.450)
pO2, Arterial: 85 mmHg (ref 83.0–108.0)

## 2021-01-04 LAB — MAGNESIUM: Magnesium: 2.4 mg/dL (ref 1.7–2.4)

## 2021-01-04 LAB — BASIC METABOLIC PANEL
Anion gap: 9 (ref 5–15)
BUN: 29 mg/dL — ABNORMAL HIGH (ref 8–23)
CO2: 25 mmol/L (ref 22–32)
Calcium: 9.3 mg/dL (ref 8.9–10.3)
Chloride: 105 mmol/L (ref 98–111)
Creatinine, Ser: 1.44 mg/dL — ABNORMAL HIGH (ref 0.61–1.24)
GFR, Estimated: 55 mL/min — ABNORMAL LOW (ref >60)
Glucose, Bld: 107 mg/dL — ABNORMAL HIGH (ref 70–99)
Potassium: 4.3 mmol/L (ref 3.5–5.1)
Sodium: 139 mmol/L (ref 135–145)

## 2021-01-04 LAB — GLUCOSE, CAPILLARY
Glucose-Capillary: 151 mg/dL — ABNORMAL HIGH (ref 70–99)
Glucose-Capillary: 149 mg/dL — ABNORMAL HIGH (ref 70–99)
Glucose-Capillary: 161 mg/dL — ABNORMAL HIGH (ref 70–99)
Glucose-Capillary: 153 mg/dL — ABNORMAL HIGH (ref 70–99)

## 2021-01-04 LAB — CBC
HCT: 52.1 % — ABNORMAL HIGH (ref 39.0–52.0)
Hemoglobin: 16.4 g/dL (ref 13.0–17.0)
MCH: 29 pg (ref 26.0–34.0)
MCHC: 31.5 g/dL (ref 30.0–36.0)
MCV: 92 fL (ref 80.0–100.0)
Platelets: 209 10*3/uL (ref 150–400)
RBC: 5.66 MIL/uL (ref 4.22–5.81)
RDW: 15 % (ref 11.5–15.5)
WBC: 7.5 10*3/uL (ref 4.0–10.5)
nRBC: 0 % (ref 0.0–0.2)

## 2021-01-04 LAB — PROCALCITONIN: Procalcitonin: <0.1 ng/mL

## 2021-01-04 LAB — MRSA NEXT GEN BY PCR, NASAL: MRSA by PCR Next Gen: NOT DETECTED

## 2021-01-04 LAB — HIV ANTIBODY (ROUTINE TESTING W REFLEX): HIV Screen 4th Generation wRfx: NONREACTIVE

## 2021-01-04 MED ORDER — DOCUSATE SODIUM 50 MG/5ML PO LIQD
100.0000 mg | Freq: Two times a day (BID) | ORAL | Status: DC
  Administered 2021-01-04 – 2021-01-06 (×3): 100 mg
  Filled 2021-01-04 (×6): qty 10

## 2021-01-04 MED ORDER — ONDANSETRON HCL 4 MG/2ML IJ SOLN: 4.0000 mg | Freq: Four times a day (QID) | INTRAMUSCULAR | Status: DC | PRN

## 2021-01-04 MED ORDER — POLYETHYLENE GLYCOL 3350 17 G PO PACK
17.0000 g | PACK | Freq: Every day | ORAL | Status: DC
  Administered 2021-01-06: 17 g
  Filled 2021-01-04 (×4): qty 1

## 2021-01-04 MED ORDER — EPINEPHRINE 0.3 MG/0.3ML IJ SOAJ
INTRAMUSCULAR | Status: AC
  Administered 2021-01-04: 0.3 mg via INTRAMUSCULAR
  Filled 2021-01-04: qty 0.3

## 2021-01-04 MED ORDER — POLYETHYLENE GLYCOL 3350 17 G PO PACK: 17.0000 g | PACK | Freq: Every day | ORAL | Status: DC | PRN

## 2021-01-04 MED ORDER — CHLORHEXIDINE GLUCONATE 0.12% ORAL RINSE (MEDLINE KIT)
15.0000 mL | Freq: Two times a day (BID) | OROMUCOSAL | Status: DC
  Administered 2021-01-04 – 2021-01-06 (×5): 15 mL via OROMUCOSAL

## 2021-01-04 MED ORDER — HYDRALAZINE HCL 20 MG/ML IJ SOLN: 10.0000 mg | Freq: Once | INTRAMUSCULAR | Status: DC

## 2021-01-04 MED ORDER — FAMOTIDINE 20 MG IN NS 100 ML IVPB
20.0000 mg | Freq: Two times a day (BID) | INTRAVENOUS | Status: DC
  Administered 2021-01-04 – 2021-01-06 (×5): 20 mg via INTRAVENOUS
  Filled 2021-01-04 (×7): qty 100

## 2021-01-04 MED ORDER — ORAL CARE MOUTH RINSE
15.0000 mL | OROMUCOSAL | Status: DC
  Administered 2021-01-04 – 2021-01-06 (×21): 15 mL via OROMUCOSAL

## 2021-01-04 MED ORDER — KETAMINE HCL 50 MG/5ML IJ SOSY
PREFILLED_SYRINGE | INTRAMUSCULAR | Status: AC
  Administered 2021-01-04: 50 mg via INTRAVENOUS
  Filled 2021-01-04: qty 5

## 2021-01-04 MED ORDER — DIPHENHYDRAMINE HCL 50 MG/ML IJ SOLN
25.0000 mg | Freq: Once | INTRAMUSCULAR | Status: AC
Start: 2021-01-04 — End: 2021-01-04
  Administered 2021-01-04: 25 mg via INTRAVENOUS

## 2021-01-04 MED ORDER — ALBUTEROL SULFATE (2.5 MG/3ML) 0.083% IN NEBU: 2.5000 mg | INHALATION_SOLUTION | RESPIRATORY_TRACT | Status: DC

## 2021-01-04 MED ORDER — METHYLPREDNISOLONE SODIUM SUCC 125 MG IJ SOLR
125.0000 mg | Freq: Once | INTRAMUSCULAR | Status: AC
  Administered 2021-01-04: 125 mg via INTRAVENOUS
  Filled 2021-01-04: qty 2

## 2021-01-04 MED ORDER — PROPOFOL 10 MG/ML IV BOLUS
INTRAVENOUS | Status: DC | PRN
  Administered 2021-01-04: 120 mg via INTRAVENOUS

## 2021-01-04 MED ORDER — LACTATED RINGERS IV SOLN: INTRAVENOUS | Status: DC

## 2021-01-04 MED ORDER — KETAMINE HCL 10 MG/ML IJ SOLN
INTRAMUSCULAR | Status: DC | PRN
  Administered 2021-01-04: 40 mg via INTRAVENOUS

## 2021-01-04 MED ORDER — DIPHENHYDRAMINE HCL 50 MG/ML IJ SOLN
25.0000 mg | Freq: Once | INTRAMUSCULAR | Status: AC
  Administered 2021-01-04: 25 mg via INTRAVENOUS
  Filled 2021-01-04: qty 1

## 2021-01-04 MED ORDER — KETAMINE HCL 50 MG/5ML IJ SOSY: 50.0000 mg | PREFILLED_SYRINGE | Freq: Once | INTRAMUSCULAR | Status: AC

## 2021-01-04 MED ORDER — PROPOFOL 1000 MG/100ML IV EMUL
INTRAVENOUS | Status: DC | PRN
  Administered 2021-01-04: 20 ug/kg/min via INTRAVENOUS

## 2021-01-04 MED ORDER — METHYLPREDNISOLONE SODIUM SUCC 125 MG IJ SOLR
60.0000 mg | INTRAMUSCULAR | Status: DC
  Administered 2021-01-05 – 2021-01-06 (×2): 60 mg via INTRAVENOUS
  Filled 2021-01-04 (×2): qty 2

## 2021-01-04 MED ORDER — FENTANYL BOLUS VIA INFUSION
50.0000 ug | INTRAVENOUS | Status: DC | PRN
  Administered 2021-01-04 (×2): 50 ug via INTRAVENOUS
  Administered 2021-01-04 – 2021-01-05 (×2): 100 ug via INTRAVENOUS
  Administered 2021-01-05: 50 ug via INTRAVENOUS
  Filled 2021-01-04: qty 100

## 2021-01-04 MED ORDER — HEPARIN SODIUM (PORCINE) 5000 UNIT/ML IJ SOLN
5000.0000 [IU] | Freq: Three times a day (TID) | INTRAMUSCULAR | Status: DC
  Administered 2021-01-04 – 2021-01-07 (×9): 5000 [IU] via SUBCUTANEOUS
  Filled 2021-01-04 (×9): qty 1

## 2021-01-04 MED ORDER — ALBUTEROL SULFATE (2.5 MG/3ML) 0.083% IN NEBU
2.5000 mg | INHALATION_SOLUTION | Freq: Two times a day (BID) | RESPIRATORY_TRACT | Status: DC
  Administered 2021-01-04 – 2021-01-09 (×10): 2.5 mg via RESPIRATORY_TRACT
  Filled 2021-01-04 (×10): qty 3

## 2021-01-04 MED ORDER — PROPOFOL 1000 MG/100ML IV EMUL
0.0000 ug/kg/min | INTRAVENOUS | Status: DC
  Administered 2021-01-04: 40 ug/kg/min via INTRAVENOUS
  Administered 2021-01-04: 35 ug/kg/min via INTRAVENOUS
  Administered 2021-01-04: 40 ug/kg/min via INTRAVENOUS
  Administered 2021-01-04: 20.631 ug/kg/min via INTRAVENOUS
  Administered 2021-01-04: 40 ug/kg/min via INTRAVENOUS
  Administered 2021-01-04: 35 ug/kg/min via INTRAVENOUS
  Administered 2021-01-05: 40 ug/kg/min via INTRAVENOUS
  Administered 2021-01-05 (×2): 50 ug/kg/min via INTRAVENOUS
  Administered 2021-01-05: 40 ug/kg/min via INTRAVENOUS
  Administered 2021-01-05: 30 ug/kg/min via INTRAVENOUS
  Administered 2021-01-05 (×4): 40 ug/kg/min via INTRAVENOUS
  Administered 2021-01-06: 50 ug/kg/min via INTRAVENOUS
  Administered 2021-01-06: 25 ug/kg/min via INTRAVENOUS
  Administered 2021-01-06: 40 ug/kg/min via INTRAVENOUS
  Filled 2021-01-04 (×11): qty 100
  Filled 2021-01-04: qty 200
  Filled 2021-01-04 (×3): qty 100
  Filled 2021-01-04: qty 1000

## 2021-01-04 MED ORDER — IOHEXOL 350 MG/ML SOLN
75.0000 mL | Freq: Once | INTRAVENOUS | Status: AC | PRN
  Administered 2021-01-04: 75 mL via INTRAVENOUS

## 2021-01-04 MED ORDER — DOCUSATE SODIUM 100 MG PO CAPS: 100.0000 mg | ORAL_CAPSULE | Freq: Two times a day (BID) | ORAL | Status: DC | PRN

## 2021-01-04 MED ORDER — FAMOTIDINE 20 MG IN NS 100 ML IVPB
20.0000 mg | INTRAVENOUS | Status: AC
  Administered 2021-01-04: 20 mg via INTRAVENOUS
  Filled 2021-01-04: qty 100

## 2021-01-04 MED ORDER — FENTANYL CITRATE PF 50 MCG/ML IJ SOSY
50.0000 ug | PREFILLED_SYRINGE | Freq: Once | INTRAMUSCULAR | Status: DC
  Filled 2021-01-04: qty 1

## 2021-01-04 MED ORDER — DIPHENHYDRAMINE HCL 50 MG/ML IJ SOLN
12.5000 mg | Freq: Three times a day (TID) | INTRAMUSCULAR | Status: DC
  Administered 2021-01-04 – 2021-01-07 (×9): 12.5 mg via INTRAVENOUS
  Filled 2021-01-04 (×2): qty 1
  Filled 2021-01-04: qty 0.25
  Filled 2021-01-04 (×4): qty 1

## 2021-01-04 MED ORDER — PROPOFOL 1000 MG/100ML IV EMUL
INTRAVENOUS | Status: AC
  Filled 2021-01-04: qty 100

## 2021-01-04 MED ORDER — ROCURONIUM BROMIDE 50 MG/5ML IV SOLN
INTRAVENOUS | Status: DC | PRN
  Administered 2021-01-04: 100 mg via INTRAVENOUS

## 2021-01-04 MED ORDER — ACETAMINOPHEN 325 MG PO TABS: 650.0000 mg | ORAL_TABLET | ORAL | Status: DC | PRN

## 2021-01-04 MED ORDER — CHLORHEXIDINE GLUCONATE CLOTH 2 % EX PADS
6.0000 | MEDICATED_PAD | Freq: Every day | CUTANEOUS | Status: DC
  Administered 2021-01-04 – 2021-01-08 (×5): 6 via TOPICAL

## 2021-01-04 MED ORDER — EPINEPHRINE 0.3 MG/0.3ML IJ SOAJ: 0.3000 mg | Freq: Once | INTRAMUSCULAR | Status: AC

## 2021-01-04 MED ORDER — FENTANYL 2500MCG IN NS 250ML (10MCG/ML) PREMIX INFUSION
50.0000 ug/h | INTRAVENOUS | Status: DC
  Administered 2021-01-04 – 2021-01-05 (×2): 100 ug/h via INTRAVENOUS
  Administered 2021-01-06: 150 ug/h via INTRAVENOUS
  Filled 2021-01-04 (×3): qty 250

## 2021-01-04 NOTE — ED Notes (Addendum)
Report given Despina Pole, RN of [phone]. RN Despina Pole, of [id number] informed of gtt changes

## 2021-01-04 NOTE — ED Provider Notes (Signed)
[facility] EMERGENCY DEPARTMENT Provider Note   CSN: [id number] Arrival date & time: 01/04/21  0444     History Chief Complaint  Patient presents with   Allergic Reaction    Zachary Hawkins is a 65 y.o. male.  HPI 65 year old male with a history of cardiomyopathy, CKD stage III, hypertension, paroxysmal A. fib, angioedema secondary to ACE inhibitor's presents to the ER with complaints of throat swelling.  Patient noticed that it was getting more difficult for him to talk at around 4 AM this morning.  He reports a remote history to black cherries many years ago.  He states he ate some black cherries the morning prior but nothing recently.  Denies taking any ACE inhibitor. He states he does have a broken tooth in his mouth and is scheduled to see a dentist.  He feels like his throat is swollen, and is having more difficulty talking.  Denies any fevers or chills.    Past Medical History:  Diagnosis Date   Cardiomyopathy (HCC) 06/2017   EF 40-45%   Chronic renal insufficiency, stage 3 (moderate) (HCC)    Diastolic dysfunction 06/2017   grade 2    Hypertension    Hypertensive urgency    Obesity     Patient Active Problem List   Diagnosis Date Noted   Angioedema 01/04/2021   Influenza vaccine refused 06/26/2020   COVID-19 vaccine series completed 06/26/2020   Paroxysmal atrial fibrillation (HCC) 07/02/2019   Secondary hypercoagulable state (HCC) 07/02/2019   OSA (obstructive sleep apnea) 04/03/2019   History of angioedema due to ACE INHIBITORS 10/13/2017   Non compliance w medication regimen 07/13/2017   Systolic and diastolic CHF, chronic (HCC) 06/23/2017   Uncontrolled hypertension 06/23/2017   Obesity with serious comorbidity    CKD (chronic kidney disease) stage 3, GFR 30-59 ml/min (HCC)    Dilated cardiomyopathy (HCC) 06/06/2017   Dyspnea 06/05/2017    History reviewed. No pertinent surgical history.     Family History  Problem Relation Age of  Onset   Diabetes Mother    Diabetes Father     Social History   Tobacco Use   Smoking status: Every Day    Packs/day: 1.00    Types: Cigarettes   Smokeless tobacco: Never   Tobacco comments:    1 pack last 2-3 days  Substance Use Topics   Alcohol use: Yes    Alcohol/week: 1.0 standard drink    Types: 1 Shots of liquor per week    Comment: socially   Drug use: Yes    Types: Marijuana    Comment: every other week    Home Medications Prior to Admission medications   Medication Sig Start Date End Date Taking? Authorizing Provider  ADVAIR HFA 115-21 MCG/ACT inhaler INHALE 2 PUFFS INTO THE LUNGS 2 (TWO) TIMES DAILY. 07/23/20   Hoy Register, MD  albuterol (VENTOLIN HFA) 108 (90 Base) MCG/ACT inhaler Inhale 2 puffs into the lungs every 6 (six) hours as needed for wheezing or shortness of breath (Shortness of breath). Patient not taking: Reported on 12/16/2020 04/18/19   Storm Frisk, MD  albuterol (VENTOLIN HFA) 108 (90 Base) MCG/ACT inhaler Inhale 2 puffs into the lungs every 4 (four) hours as needed for wheezing or shortness of breath. 10/17/20   Harris, Abigail, PA-C  amLODipine (NORVASC) 10 MG tablet TAKE 1 TABLET (10 MG TOTAL) BY MOUTH DAILY. Patient taking differently: Take 10 mg by mouth daily. 11/06/19 12/16/20  Abelino Derrick, PA-C  amoxicillin (  AMOXIL) 500 MG capsule Take 1 capsule (500 mg total) by mouth 3 (three) times daily. Patient not taking: No sig reported 06/26/20   Marcine Matar, MD  amoxicillin-clavulanate (AUGMENTIN) 500-125 MG tablet TAKE 1 TABLET (500 MG TOTAL) BY MOUTH 2 (TWO) TIMES DAILY WITH A MEAL. Patient not taking: No sig reported 07/14/20 07/14/21  Marcine Matar, MD  apixaban (ELIQUIS) 5 MG TABS tablet Take 1 tablet (5 mg total) by mouth 2 (two) times daily. 06/02/20 12/16/20  Fenton, Clint R, PA  atorvastatin (LIPITOR) 10 MG tablet TAKE 1 TABLET (10 MG TOTAL) BY MOUTH DAILY. Patient taking differently: Take 10 mg by mouth daily. 11/06/19 12/16/20  Abelino Derrick, PA-C  carvedilol (COREG) 3.125 MG tablet TAKE 1 TABLET (3.125 MG TOTAL) BY MOUTH 2 (TWO) TIMES DAILY WITH A MEAL. Patient taking differently: Take 3.125 mg by mouth 2 (two) times daily with a meal. 11/06/19 12/16/20  Kilroy, Eda Paschal, PA-C  fluticasone (FLONASE) 50 MCG/ACT nasal spray PLACE 2 SPRAYS INTO BOTH NOSTRILS DAILY. Patient taking differently: Place 2 sprays into both nostrils daily as needed for allergies. 06/26/20 06/26/21  Marcine Matar, MD  furosemide (LASIX) 40 MG tablet TAKE 1 TABLET (40 MG TOTAL) BY MOUTH DAILY. Patient taking differently: Take 40 mg by mouth daily. 11/06/19 12/16/20  Abelino Derrick, PA-C  hydrALAZINE (APRESOLINE) 50 MG tablet TAKE 1 TABLET (50 MG TOTAL) BY MOUTH 3 (THREE) TIMES DAILY. Patient taking differently: Take 50 mg by mouth 3 (three) times daily. 11/06/19 11/05/20  Abelino Derrick, PA-C  isosorbide dinitrate (ISORDIL) 20 MG tablet TAKE 1 TABLET (20 MG TOTAL) BY MOUTH 3 (THREE) TIMES DAILY. SCHEDULE OFFICE VISIT Patient taking differently: Take 20 mg by mouth 3 (three) times daily. 10/17/20 10/17/21  Harris, Cammy Copa, PA-C  loratadine (CLARITIN) 10 MG tablet TAKE 1 TABLET (10 MG TOTAL) BY MOUTH DAILY. Patient not taking: No sig reported 06/26/20 06/26/21  Marcine Matar, MD  methylPREDNISolone (MEDROL DOSEPAK) 4 MG TBPK tablet Use as directed Patient not taking: No sig reported 10/17/20   Arthor Captain, PA-C  sodium chloride (OCEAN) 0.65 % SOLN nasal spray Place 2 sprays into both nostrils 3 (three) times daily. Patient taking differently: Place 2 sprays into both nostrils as needed for congestion. 04/18/19   Storm Frisk, MD    Allergies    Ace inhibitors and Black cherry fruit extract [name]]  Review of Systems   Review of Systems  Constitutional:  Negative for chills and fever.  HENT:  Positive for dental problem, trouble swallowing and voice change. Negative for drooling, ear pain and sore throat.   Eyes:  Negative for pain and  visual disturbance.  Respiratory:  Positive for shortness of breath. Negative for cough.   Cardiovascular:  Negative for chest pain and palpitations.  Gastrointestinal:  Negative for abdominal pain and vomiting.  Genitourinary:  Negative for dysuria and hematuria.  Musculoskeletal:  Negative for arthralgias and back pain.  Skin:  Negative for color change and rash.  Neurological:  Negative for seizures and syncope.  All other systems reviewed and are negative.  Physical Exam Updated Vital Signs BP 123/90   Pulse (!) 102   Temp 98.2 F (36.8 C) (Oral)   Resp 20   Ht (P) 6' (1.829 m)   Wt (!) 143.8 kg   SpO2 99%   BMI (P) 42.99 kg/m   Physical Exam Vitals and nursing note reviewed.  Constitutional:      Appearance: He is  well-developed. He is obese.  HENT:     Head: Normocephalic and atraumatic.     Mouth/Throat:     Comments: Grade 3 uvula swelling w/ moderate tongue swelling. No sublingual swelling, no tripoding. Muffled voice, no drooling. Swollen submandibular soft tissue bilaterally. Speaking in short sentences Eyes:     Conjunctiva/sclera: Conjunctivae normal.  Cardiovascular:     Rate and Rhythm: Normal rate and regular rhythm.     Heart sounds: No murmur heard. Pulmonary:     Effort: Pulmonary effort is normal. No respiratory distress.     Breath sounds: Normal breath sounds.     Comments: Gurgling respirations  Abdominal:     Palpations: Abdomen is soft.     Tenderness: There is no abdominal tenderness.  Musculoskeletal:        General: Normal range of motion.     Cervical back: Normal range of motion and neck supple. No rigidity or tenderness.  Skin:    General: Skin is warm and dry.  Neurological:     General: No focal deficit present.     Mental Status: He is alert and oriented to person, place, and time.  Psychiatric:        Mood and Affect: Mood normal.        Behavior: Behavior normal.    ED Results / Procedures / Treatments   Labs (all labs  ordered are listed, but only abnormal results are displayed) Labs Reviewed  BASIC METABOLIC PANEL - Abnormal; Notable for the following components:      Result Value   Glucose, Bld 107 (*)    BUN 29 (*)    Creatinine, Ser 1.44 (*)    GFR, Estimated 55 (*)    All other components within normal limits  CBC - Abnormal; Notable for the following components:   HCT 52.1 (*)    All other components within normal limits  HIV ANTIBODY (ROUTINE TESTING W REFLEX)  MAGNESIUM  PROCALCITONIN  BLOOD GAS, ARTERIAL    EKG None  Radiology DG Chest 2 View  Result Date: 01/04/2021 CLINICAL DATA:  65 year old male with shortness of breath. Tongue swelling. EXAM: CHEST - 2 VIEW COMPARISON:  Chest radiographs 12/16/2020 and earlier. FINDINGS: Motion artifact on today's lateral view. Lower lung volumes. Mediastinal contours are stable, no cardiomegaly. Visualized tracheal air column is within normal limits. No pneumothorax, pulmonary edema, pleural effusion or confluent pulmonary opacity. No acute osseous abnormality identified. Bulky midthoracic endplate osteophytes. Negative visible bowel gas pattern. IMPRESSION: Lower lung volumes.  No acute cardiopulmonary abnormality. Electronically Signed   By: Odessa Fleming M.D.   On: 01/04/2021 05:59    Procedures .Critical Care  Date/Time: 01/04/2021 8:35 AM Performed by: Mare Ferrari, PA-C Authorized by: Mare Ferrari, PA-C   Critical care provider statement:    Critical care time (minutes):  55   Critical care was necessary to treat or prevent imminent or life-threatening deterioration of the following conditions:  Respiratory failure   Critical care was time spent personally by me on the following activities:  Discussions with consultants, evaluation of patient's response to treatment, examination of patient, ordering and performing treatments and interventions, ordering and review of laboratory studies, ordering and review of radiographic studies, pulse  oximetry, re-evaluation of patient's condition, obtaining history from patient or surrogate and review of old charts   Medications Ordered in ED Medications  diphenhydrAMINE (BENADRYL) injection 25 mg (has no administration in time range)  hydrALAZINE (APRESOLINE) injection 10 mg (0 mg Intravenous Hold  01/04/21 0825)  ketamine 50 mg in normal saline 5 mL (10 mg/mL) syringe (has no administration in time range)  heparin injection 5,000 Units (has no administration in time range)  lactated ringers infusion (has no administration in time range)  acetaminophen (TYLENOL) tablet 650 mg (has no administration in time range)  docusate sodium (COLACE) capsule 100 mg (has no administration in time range)  polyethylene glycol (MIRALAX / GLYCOLAX) packet 17 g (has no administration in time range)  ondansetron (ZOFRAN) injection 4 mg (has no administration in time range)  famotidine (PEPCID) IVPB 20 mg in NS 100 mL IVPB (has no administration in time range)  albuterol (PROVENTIL) (2.5 MG/3ML) 0.083% nebulizer solution 2.5 mg (has no administration in time range)  propofol (DIPRIVAN) 1000 MG/100ML infusion (has no administration in time range)  fentaNYL (SUBLIMAZE) injection 50 mcg (50 mcg Intravenous Not Given 01/04/21 0824)  fentaNYL 2500mcg in NS 250mL (4010mcg/ml) infusion-PREMIX (100 mcg/hr Intravenous New Bag/Given 01/04/21 0822)  fentaNYL (SUBLIMAZE) bolus via infusion 50-100 mcg (has no administration in time range)  rocuronium (ZEMURON) injection (100 mg Intravenous Given 01/04/21 0812)  propofol (DIPRIVAN) 1000 MG/100ML infusion (20 mcg/kg/min  143.8 kg Intravenous New Bag/Given 01/04/21 0817)  ketamine (KETALAR) injection (40 mg Intravenous Given 01/04/21 0803)  propofol (DIPRIVAN) 10 mg/mL bolus/IV push (120 mg Intravenous Given 01/04/21 0803)  EPINEPHrine (EPI-PEN) injection 0.3 mg (0.3 mg Intramuscular Given 01/04/21 0645)  diphenhydrAMINE (BENADRYL) injection 25 mg (25 mg Intravenous Given 01/04/21  0653)  famotidine (PEPCID) IVPB 20 mg in NS 100 mL IVPB (0 mg Intravenous Stopped 01/04/21 0725)  methylPREDNISolone sodium succinate (SOLU-MEDROL) 125 mg/2 mL injection 125 mg (125 mg Intravenous Given 01/04/21 [id number])    ED Course  I have reviewed the triage vital signs and the nursing notes.  Pertinent labs & imaging results that were available during my care of the patient were reviewed by me and considered in my medical decision making (see chart for details).    MDM Rules/Calculators/A&P                           65 year old male who presents to the ER with complaints of Throat swelling and shortness of breath.  On arrival, he an obese male, speaking with a muffled voice w/ occasional gurgling sounds. No drooling, no tripoding.  Hypertensive on arrival with a blood pressure 170/117, hypoxic at 89%, afebrile.  Physical exam w/ 3+ uvula swelling w/ mild-moderate tongue swelling.No lip swelling.  Patient does have some submandibular tenderness bilaterally.  No sublingual swelling.  Oxygen saturations ranged from 89% to 95%.  Labs ordered in triage, reviewed by me.  BMP without any electrode abnormalities, creatinine of 1.44 with a GFR 55 which appears to be more or less at baseline.  CBC without leukocytosis.  Patient received anaphylactic protocol regimen, including IM epi, Solu-Medrol, Benadryl, Pepcid.  On reevaluation, patient did not have any improvement in his symptoms or swelling.  On reexamination, uvula swelling seemed to progress, and patient's voice had become more muffled.  Patient was then consented for emergent intubation for airway protection.  Anesthesia performed the intubation w/ critical care as backup for emergency airway.   CT soft tissue neck pending to rule out infection. Critical care to admit  This was a shared visit with my supervising physician Dr. Wilkie AyeHorton who independently saw and evaluated the patient & provided guidance in evaluation/management/disposition ,in  agreement with care    Final Clinical Impression(s) / ED Diagnoses  Final diagnoses:  Angioedema, initial encounter    Rx / DC Orders ED Discharge Orders     None        Mare Ferrari, PA-C 01/04/21 1610    Rozelle Logan, DO 01/05/21 1540

## 2021-01-04 NOTE — Progress Notes (Signed)
RT assisted with transportation of this pt from 2M11 to CT and back while on full ventilatory support. Pt tolerated well with SVS.

## 2021-01-04 NOTE — ED Notes (Signed)
Titrated Pt propofol to . Unable to in [facility]

## 2021-01-04 NOTE — ED Triage Notes (Signed)
Patient with increased shortness of breath.  States that he is allergic to cherries and now states that he had something that is making his throat feel like it is closing up, states he feels like his tongue is swollen.  Patient states that he is having a hard time breathing and can not swallow.

## 2021-01-04 NOTE — Progress Notes (Signed)
Patient intubated by anaesthesia, color change on ETCO2 detector, large amount of oral bloody secretions was suctioned by anaesthesia doctor after intubation, SATS 100%, will continue to monitor patient.

## 2021-01-04 NOTE — Anesthesia Procedure Notes (Addendum)
Procedure Name: Intubation Date/Time: 01/04/2021 8:08 AM Performed by: Rande Brunt, CRNA Pre-anesthesia Checklist: Patient identified, Emergency Drugs available, Suction available and Patient being monitored Patient Re-evaluated:Patient Re-evaluated prior to induction Oxygen Delivery Method: Circle System Utilized Preoxygenation: Pre-oxygenation with 100% oxygen Induction Type: IV induction, Rapid sequence and Cricoid Pressure applied Ventilation: Mask ventilation without difficulty and Two handed mask ventilation required Laryngoscope Size: Glidescope and 4 Grade View: Grade III Tube type: Oral Tube size: 7.5 mm Number of attempts: 1 Airway Equipment and Method: Stylet Placement Confirmation: ETT inserted through vocal cords under direct vision, positive ETCO2, breath sounds checked- equal and bilateral and CO2 detector Secured at: 22 cm Tube secured with: Tape Dental Injury: Teeth and Oropharynx as per pre-operative assessment  Difficulty Due To: Difficult Airway-  due to edematous airway, Difficult Airway- due to large tongue and Difficulty was anticipated Future Recommendations: Recommend- induction with short-acting agent, and alternative techniques readily available Comments: Called to emergency dept for pt with angioedema. Patient sats 100% and CCM MD at bedside, controlled environment with emergency cric kit available. 100% O2 via ambu mask and suction at head of bed. 40 mg ketamine given IV and 50 mg propofol IV. Grade 1 view with glidescope. 120 mg succinylcholine given IV. Attempted to intubate by CRNA Junie Bame, however very friable tissue and bleeding with loss of view. Suctioned and then Dr. Lissa Hoard intubated with grade 2b view.

## 2021-01-04 NOTE — ED Notes (Signed)
Pt requested this RN to call daughter and provide update via patient's phone. Called daughter, no answer and patient requested RN to leave message with update.

## 2021-01-04 NOTE — H&P (Addendum)
NAME:  Zachary Hawkins, MRN:  [id number], DOB:  July 20, 1955, LOS: 0 ADMISSION DATE:  01/04/2021, CONSULTATION DATE:  01/04/21 REFERRING MD:  Wilkie Aye - EM, CHIEF COMPLAINT:  Angioedema    History of Present Illness:  65 yo M PMH ACE-I angioedema, black cherry mediated anaphylaxis, Afib on eliquis, systolic and diastolic HF who presented to ED 01/04/21 with SOB, tongue swelling. Sx started approx 0400 01/04/21. Ate red cherries 8/27 (day prior to presentation), unclear if this mediated current presentation.  Received epi, pepcid, benadryl solumedrol in ED.   HDS in ED, hypoxic on arrival but oxygenating well with supplemental O2. Anesthesiologist at bedside with plans to intubate for airway protection, PCCM consulted for backup in event of surgical airway, and for admission.    Labs on presentation quite benign -- Cr 1.44 which is down from 1.54 12/15/20   Pertinent  Medical History   Angioedema: ACE-I, black cherry extract  Systolic and diastolic HF Dilated cardiomyopathy HTN Afib on eliquis BPH Significant Hospital Events: Including procedures, antibiotic start and stop dates in addition to other pertinent events   8/28 to ED with facial swelling, intubated for airway protection 2/2 angioedema. Admit to ICU   Interim History / Subjective:  Progressive facial swelling in ED   Objective   Blood pressure (!) 203/105, pulse 71, temperature 98.2 F (36.8 C), temperature source Oral, resp. rate (!) 30, height 6\' 1"  (1.854 m), weight (!) 143.8 kg, SpO2 93 %.       No intake or output data in the 24 hours ending 01/04/21 0803 Filed Weights   01/04/21 0701  Weight: (!) 143.8 kg    Examination: General: WDWN ill appearing M reclined in ED stretcher intubated sedated  HENT: ETT secure. Eyes taped. Lip and tongue swelling.  Lungs: Symmetrical chest expansion. Upper lobe rhonchi R>L. Mechanically ventilated  Cardiovascular: tachycardic regular s1s2 cap refill brisk 2+ peripheral  pulses Abdomen: soft round ndnt + bowel sounds Extremities: no acute joint deformity no cyanosis or clubbing  Neuro: sedated chemically paralyzed  GU: defer   Resolved Hospital Problem list     Assessment & Plan:   Acute respiratory failure with hypoxia requiring intubation in setting of airway compromise due to angioedema Angioedema  -hx ACE-I and black cherry angioedema. Did eat red cherries 8/27. Limited history at time of admission  -ddx would be swelling due to infectious etiology, but with rapid progression of swelling, lack of clinical evidence of infection at this time do favor angioedema vs anaphylaxis  Hx OSA P -Maintain advanced airway -- RASS -3, prop, fent gtts  -CT soft tissue neck w con, CT maxillofacial non con -CXR, ABG following intubation  -BID pepcid, solumedrol, benadryl   Afib on eliquis  P -hold for today -- has some bleeding following intubation   Systolic and diastolic CHF  Dilated cardiomyopathy  HTN P -hold home antihypertensives -- will wait to see how he settles out with sedation   CKD III P -trend renal indices   BPH  -bladder scan if needed   Best Practice (right click and "Reselect all SmartList Selections" daily)   Diet/type: NPO DVT prophylaxis: prophylactic heparin  GI prophylaxis: H2B Lines: N/A Foley:  N/A Code Status:  full code Last date of multidisciplinary goals of care discussion [pending]  Labs   CBC: Recent Labs  Lab 01/04/21 0457  WBC 7.5  HGB 16.4  HCT 52.1*  MCV 92.0  PLT 209    Basic Metabolic Panel: Recent Labs  Lab  01/04/21 0457  NA 139  K 4.3  CL 105  CO2 25  GLUCOSE 107*  BUN 29*  CREATININE 1.44*  CALCIUM 9.3   GFR: Estimated Creatinine Clearance: 78.4 mL/min (A) (by C-G formula based on SCr of 1.44 mg/dL (H)). Recent Labs  Lab 01/04/21 0457  WBC 7.5    Liver Function Tests: No results for input(s): AST, ALT, ALKPHOS, BILITOT, PROT, ALBUMIN in the last 168 hours. No results for  input(s): LIPASE, AMYLASE in the last 168 hours. No results for input(s): AMMONIA in the last 168 hours.  ABG    Component Value Date/Time   TCO2 29 10/13/2017 0450     Coagulation Profile: No results for input(s): INR, PROTIME in the last 168 hours.  Cardiac Enzymes: No results for input(s): CKTOTAL, CKMB, CKMBINDEX, TROPONINI in the last 168 hours.  HbA1C: No results found for: HGBA1C  CBG: No results for input(s): GLUCAP in the last 168 hours.  Review of Systems:   Unable to obtain, intubated, chemically paralyzed  Past Medical History:  He,  has a past medical history of Cardiomyopathy (HCC) (06/2017), Chronic renal insufficiency, stage 3 (moderate) (HCC), Diastolic dysfunction (06/2017), Hypertension, Hypertensive urgency, and Obesity.   Surgical History:  History reviewed. No pertinent surgical history.   Social History:   reports that he has been smoking cigarettes. He has been smoking an average of 1 pack per day. He has never used smokeless tobacco. He reports current alcohol use of about 1.0 standard drink per week. He reports current drug use. Drug: Marijuana.   Family History:  His family history includes Diabetes in his father and mother.   Allergies Allergies  Allergen Reactions   Ace Inhibitors Anaphylaxis   Black Cherry Fruit Extract [Cherry Extract] Anaphylaxis    Tongue     Home Medications  Prior to Admission medications   Medication Sig Start Date End Date Taking? Authorizing Provider  ADVAIR HFA 115-21 MCG/ACT inhaler INHALE 2 PUFFS INTO THE LUNGS 2 (TWO) TIMES DAILY. 07/23/20   Hoy Register, MD  albuterol (VENTOLIN HFA) 108 (90 Base) MCG/ACT inhaler Inhale 2 puffs into the lungs every 6 (six) hours as needed for wheezing or shortness of breath (Shortness of breath). Patient not taking: Reported on 12/16/2020 04/18/19   Storm Frisk, MD  albuterol (VENTOLIN HFA) 108 (90 Base) MCG/ACT inhaler Inhale 2 puffs into the lungs every 4 (four) hours  as needed for wheezing or shortness of breath. 10/17/20   Harris, Abigail, PA-C  amLODipine (NORVASC) 10 MG tablet TAKE 1 TABLET (10 MG TOTAL) BY MOUTH DAILY. Patient taking differently: Take 10 mg by mouth daily. 11/06/19 12/16/20  Abelino Derrick, PA-C  amoxicillin (AMOXIL) 500 MG capsule Take 1 capsule (500 mg total) by mouth 3 (three) times daily. Patient not taking: No sig reported 06/26/20   Marcine Matar, MD  amoxicillin-clavulanate (AUGMENTIN) 500-125 MG tablet TAKE 1 TABLET (500 MG TOTAL) BY MOUTH 2 (TWO) TIMES DAILY WITH A MEAL. Patient not taking: No sig reported 07/14/20 07/14/21  Marcine Matar, MD  apixaban (ELIQUIS) 5 MG TABS tablet Take 1 tablet (5 mg total) by mouth 2 (two) times daily. 06/02/20 12/16/20  Fenton, Clint R, PA  atorvastatin (LIPITOR) 10 MG tablet TAKE 1 TABLET (10 MG TOTAL) BY MOUTH DAILY. Patient taking differently: Take 10 mg by mouth daily. 11/06/19 12/16/20  Abelino Derrick, PA-C  carvedilol (COREG) 3.125 MG tablet TAKE 1 TABLET (3.125 MG TOTAL) BY MOUTH 2 (TWO) TIMES DAILY WITH A  MEAL. Patient taking differently: Take 3.125 mg by mouth 2 (two) times daily with a meal. 11/06/19 12/16/20  Kilroy, Eda Paschal, PA-C  fluticasone (FLONASE) 50 MCG/ACT nasal spray PLACE 2 SPRAYS INTO BOTH NOSTRILS DAILY. Patient taking differently: Place 2 sprays into both nostrils daily as needed for allergies. 06/26/20 06/26/21  Marcine Matar, MD  furosemide (LASIX) 40 MG tablet TAKE 1 TABLET (40 MG TOTAL) BY MOUTH DAILY. Patient taking differently: Take 40 mg by mouth daily. 11/06/19 12/16/20  Abelino Derrick, PA-C  hydrALAZINE (APRESOLINE) 50 MG tablet TAKE 1 TABLET (50 MG TOTAL) BY MOUTH 3 (THREE) TIMES DAILY. Patient taking differently: Take 50 mg by mouth 3 (three) times daily. 11/06/19 11/05/20  Abelino Derrick, PA-C  isosorbide dinitrate (ISORDIL) 20 MG tablet TAKE 1 TABLET (20 MG TOTAL) BY MOUTH 3 (THREE) TIMES DAILY. SCHEDULE OFFICE VISIT Patient taking differently: Take 20 mg by mouth 3  (three) times daily. 10/17/20 10/17/21  Harris, Cammy Copa, PA-C  loratadine (CLARITIN) 10 MG tablet TAKE 1 TABLET (10 MG TOTAL) BY MOUTH DAILY. Patient not taking: No sig reported 06/26/20 06/26/21  Marcine Matar, MD  methylPREDNISolone (MEDROL DOSEPAK) 4 MG TBPK tablet Use as directed Patient not taking: No sig reported 10/17/20   Arthor Captain, PA-C  sodium chloride (OCEAN) 0.65 % SOLN nasal spray Place 2 sprays into both nostrils 3 (three) times daily. Patient taking differently: Place 2 sprays into both nostrils as needed for congestion. 04/18/19   Storm Frisk, MD     Critical care time: 43 min      CRITICAL CARE Performed by: Lanier Clam   Total critical care time: 43 minutes  Critical care time was exclusive of separately billable procedures and treating other patients. Critical care was necessary to treat or prevent imminent or life-threatening deterioration.  Critical care was time spent personally by me on the following activities: development of treatment plan with patient and/or surrogate as well as nursing, discussions with consultants, evaluation of patient's response to treatment, examination of patient, obtaining history from patient or surrogate, ordering and performing treatments and interventions, ordering and review of laboratory studies, ordering and review of radiographic studies, pulse oximetry and re-evaluation of patient's condition.  Tessie Fass MSN, AGACNP-BC [facility] Pulmonary/Critical Care Medicine Amion for pager  01/04/2021, 9:29 AM

## 2021-01-04 NOTE — ED Provider Notes (Signed)
Called to bedside by PA for concern of impending airway failure.  Briefly this is a 65 year old male with past medical history of angioedema due to ACE inhibitor's and anaphylaxis due to cherries who presented to the emergency department concern for oral swelling, difficulty breathing.  Noted to be hypoxic on arrival.  On my initial exam the tongue is severely swollen, what appears to be the uvula is large, red and swollen, moving in and out with respirations.  He is stridorous, muffled voice, at times hypoxic on the monitor and sweaty.  Anaphylactic medications including IM epi was given approximately 40 minutes ago without improvement.  Decision was made to intubate for airway protection.  Anesthesia was called and patient was moved to a trauma resusc room.   ICU/pulmonary critical care at bedside for admission and potential surgical airway if needed.  Patient was sedated and intubated by the anesthesia team, please refer to their note for procedure note.  Intubation was successful.  ICU admitted the patient and care transferred.  Vital signs stable at time of admission.   CRITICAL CARE Performed by: Clabe Seal Evalee Gerard   Total critical care time: 60 minutes  Critical care time was exclusive of separately billable procedures and treating other patients.  Critical care was necessary to treat or prevent imminent or life-threatening deterioration.  Critical care was time spent personally by me on the following activities: development of treatment plan with patient and/or surrogate as well as nursing, discussions with consultants, evaluation of patient's response to treatment, examination of patient, obtaining history from patient or surrogate, ordering and performing treatments and interventions, ordering and review of laboratory studies, ordering and review of radiographic studies, pulse oximetry and re-evaluation of patient's condition.    Rozelle Logan, [place] 01/04/21 [phone]

## 2021-01-05 ENCOUNTER — Inpatient Hospital Stay (HOSPITAL_COMMUNITY): Payer: Medicaid Other

## 2021-01-05 DIAGNOSIS — T783XXA Angioneurotic edema, initial encounter: Secondary | ICD-10-CM | POA: Diagnosis not present

## 2021-01-05 DIAGNOSIS — Z4659 Encounter for fitting and adjustment of other gastrointestinal appliance and device: Secondary | ICD-10-CM

## 2021-01-05 LAB — RAPID URINE DRUG SCREEN, HOSP PERFORMED
Amphetamines: NOT DETECTED
Barbiturates: NOT DETECTED
Benzodiazepines: NOT DETECTED
Cocaine: POSITIVE — AB
Opiates: NOT DETECTED
Tetrahydrocannabinol: POSITIVE — AB

## 2021-01-05 LAB — BASIC METABOLIC PANEL
Anion gap: 9 (ref 5–15)
BUN: 31 mg/dL — ABNORMAL HIGH (ref 8–23)
CO2: 27 mmol/L (ref 22–32)
Calcium: 8.7 mg/dL — ABNORMAL LOW (ref 8.9–10.3)
Chloride: 102 mmol/L (ref 98–111)
Creatinine, Ser: 1.56 mg/dL — ABNORMAL HIGH (ref 0.61–1.24)
GFR, Estimated: 50 mL/min — ABNORMAL LOW (ref >60)
Glucose, Bld: 126 mg/dL — ABNORMAL HIGH (ref 70–99)
Potassium: 4.2 mmol/L (ref 3.5–5.1)
Sodium: 138 mmol/L (ref 135–145)

## 2021-01-05 LAB — CBC
HCT: 45.4 % (ref 39.0–52.0)
Hemoglobin: 14.4 g/dL (ref 13.0–17.0)
MCH: 28.3 pg (ref 26.0–34.0)
MCHC: 31.7 g/dL (ref 30.0–36.0)
MCV: 89.4 fL (ref 80.0–100.0)
Platelets: 238 10*3/uL (ref 150–400)
RBC: 5.08 MIL/uL (ref 4.22–5.81)
RDW: 15.2 % (ref 11.5–15.5)
WBC: 12.1 10*3/uL — ABNORMAL HIGH (ref 4.0–10.5)
nRBC: 0 % (ref 0.0–0.2)

## 2021-01-05 LAB — MAGNESIUM
Magnesium: 2.5 mg/dL — ABNORMAL HIGH (ref 1.7–2.4)
Magnesium: 2.4 mg/dL (ref 1.7–2.4)

## 2021-01-05 LAB — TRIGLYCERIDES: Triglycerides: 126 mg/dL (ref <150)

## 2021-01-05 LAB — PHOSPHORUS
Phosphorus: 3.9 mg/dL (ref 2.5–4.6)
Phosphorus: 3.8 mg/dL (ref 2.5–4.6)

## 2021-01-05 LAB — SARS CORONAVIRUS 2 (TAT 6-24 HRS): SARS Coronavirus 2: NEGATIVE

## 2021-01-05 LAB — GLUCOSE, CAPILLARY
Glucose-Capillary: 146 mg/dL — ABNORMAL HIGH (ref 70–99)
Glucose-Capillary: 122 mg/dL — ABNORMAL HIGH (ref 70–99)
Glucose-Capillary: 134 mg/dL — ABNORMAL HIGH (ref 70–99)
Glucose-Capillary: 134 mg/dL — ABNORMAL HIGH (ref 70–99)
Glucose-Capillary: 127 mg/dL — ABNORMAL HIGH (ref 70–99)

## 2021-01-05 MED ORDER — TAMSULOSIN HCL 0.4 MG PO CAPS: 0.4000 mg | ORAL_CAPSULE | Freq: Every day | ORAL | Status: DC

## 2021-01-05 MED ORDER — ADULT MULTIVITAMIN W/MINERALS CH
1.0000 | ORAL_TABLET | Freq: Every day | ORAL | Status: DC
  Administered 2021-01-05 – 2021-01-06 (×2): 1
  Filled 2021-01-05 (×2): qty 1

## 2021-01-05 MED ORDER — BETHANECHOL CHLORIDE 10 MG PO TABS
10.0000 mg | ORAL_TABLET | Freq: Three times a day (TID) | ORAL | Status: DC
  Administered 2021-01-05 – 2021-01-06 (×3): 10 mg
  Filled 2021-01-05 (×3): qty 1

## 2021-01-05 MED ORDER — POLYETHYLENE GLYCOL 3350 17 G PO PACK
17.0000 g | PACK | Freq: Once | ORAL | Status: AC
  Administered 2021-01-05: 17 g

## 2021-01-05 MED ORDER — DOCUSATE SODIUM 50 MG/5ML PO LIQD
100.0000 mg | Freq: Once | ORAL | Status: AC
  Administered 2021-01-05: 100 mg

## 2021-01-05 MED ORDER — PROSOURCE TF PO LIQD
90.0000 mL | Freq: Four times a day (QID) | ORAL | Status: DC
  Administered 2021-01-05 – 2021-01-06 (×3): 90 mL
  Filled 2021-01-05 (×4): qty 90

## 2021-01-05 MED ORDER — VITAL HIGH PROTEIN PO LIQD
1000.0000 mL | ORAL | Status: DC
  Administered 2021-01-05: 1000 mL

## 2021-01-05 NOTE — Progress Notes (Signed)
NAME:  Zachary Hawkins, MRN:  [id number], DOB:  November 09, 1955, LOS: 1 ADMISSION DATE:  01/04/2021, CONSULTATION DATE:  01/04/21 REFERRING MD:  Wilkie Aye - EM, CHIEF COMPLAINT:  Angioedema    History of Present Illness:  65 yo M PMH ACE-I angioedema, black cherry mediated anaphylaxis, Afib on eliquis, systolic and diastolic HF who presented to ED 01/04/21 with SOB, tongue swelling. Sx started approx 0400 01/04/21. Ate red cherries 8/27 (day prior to presentation), unclear if this mediated current presentation.  Received epi, pepcid, benadryl solumedrol in ED.   HDS in ED, hypoxic on arrival but oxygenating well with supplemental O2. Anesthesiologist at bedside with plans to intubate for airway protection, PCCM consulted for backup in event of surgical airway, and for admission.   Labs on presentation quite benign -- Cr 1.44 which is down from 1.54 12/15/20   Pertinent  Medical History   Angioedema: ACE-I, black cherry extract  Systolic and diastolic HF Dilated cardiomyopathy HTN Afib on eliquis BPH Significant Hospital Events: Including procedures, antibiotic start and stop dates in addition to other pertinent events   8/28 to ED with facial swelling, intubated for airway protection 2/2 angioedema. Admit to ICU   Interim History / Subjective:  On vent and sedated  Objective   Blood pressure 106/80, pulse 66, temperature 97.7 F (36.5 C), temperature source Oral, resp. rate 20, height 6' (1.829 m), weight (!) 140.9 kg, SpO2 97 %.    Vent Mode: PRVC FiO2 (%):  [40 %] 40 % Set Rate:  [20 bmp] 20 bmp Vt Set:  [500 mL] 500 mL PEEP:  [5 cmH20] 5 cmH20 Plateau Pressure:  [15 cmH20-23 cmH20] 22 cmH20   Intake/Output Summary (Last 24 hours) at 01/05/2021 0804 Last data filed at 01/05/2021 0700 Gross per 24 hour  Intake 1154.62 ml  Output 600 ml  Net 554.62 ml   Filed Weights   01/04/21 0701 01/05/21 0500  Weight: (!) 143.8 kg (!) 140.9 kg   Examination: Physical Exam Constitutional:       Appearance: He is obese. He is not diaphoretic.  Cardiovascular:     Rate and Rhythm: Normal rate and regular rhythm.     Pulses: Normal pulses.     Heart sounds: Normal heart sounds.  Pulmonary:     Effort: Pulmonary effort is normal.     Comments: Intubated coarse breath sounds and mild wheezing.  Abdominal:     General: Bowel sounds are normal.     Tenderness: There is no abdominal tenderness. There is no guarding.  Musculoskeletal:     Right lower leg: No edema.     Left lower leg: No edema.    Resolved Hospital Problem list     Assessment & Plan:   Acute Respiratory Failure with Hypoxia Requiring Intubation in Setting of Airway Compromise due to Angioedema Angioedema  Hx of OSA:  No febrile events overnight, small leukocytosis likely explained by initiating steroids. No infectious etiology appreciated on soft tissue neck imaging. Awaiting CT Maxillofacial, failed to wean off 2/2 to failed cuff link this AM.  -Follow up CT Maxillofacial non con imaging. -Maintain advanced airway and continue sedation RASS -3, prop, fent gtts  -Continue Pepcid 20 mg IV BID, solumedrol 125 mg QD, benadryl 12.5 mg TID  Afib (On Eliquis):  On Eliquis 5 mg BID at home. Self rate control. Held at admission due to bleeding 2/2 to intubation. Hgb drop this AM to 14 from 16, continue to hold eliquis, will reinitiate tomorrow.  -  Start Eliquis 5 mg BID 8/30 - Continue Heparin SQ  Systolic and diastolic CHF  Dilated cardiomyopathy  HTN Pressures well controlled on propofol with systolic pressures in the 110s. Home Regimen inclueds coreg, amlodipine, isosorbide dinitrate, hydralazine -Continue to hold home antihypertensives   CKD III - Trend BMP  - Avoid nephrotoxic agents  BPH  -Start Flmoxa 0.4 daily - Continue to bladder scan  Best Practice (right click and "Reselect all SmartList Selections" daily)   Diet/type: NPO DVT prophylaxis: prophylactic heparin  GI prophylaxis: H2B Lines:  N/A Foley:  N/A Code Status:  full code Last date of multidisciplinary goals of care discussion [pending]  Labs   CBC: Recent Labs  Lab 01/04/21 0457 01/04/21 0919 01/05/21 0645  WBC 7.5  --  12.1*  HGB 16.4 16.7 14.4  HCT 52.1* 49.0 45.4  MCV 92.0  --  89.4  PLT 209  --  238    Basic Metabolic Panel: Recent Labs  Lab 01/04/21 0457 01/04/21 0809 01/04/21 0919 01/05/21 0645  NA 139  --  141  --   K 4.3  --  4.5  --   CL 105  --   --   --   CO2 25  --   --   --   GLUCOSE 107*  --   --   --   BUN 29*  --   --   --   CREATININE 1.44*  --   --   --   CALCIUM 9.3  --   --   --   MG  --  2.4  --  2.5*  PHOS  --   --   --  3.9   GFR: Estimated Creatinine Clearance: 76.4 mL/min (A) (by C-G formula based on SCr of 1.44 mg/dL (H)). Recent Labs  Lab 01/04/21 0457 01/04/21 0809 01/05/21 0645  PROCALCITON  --  <0.10  --   WBC 7.5  --  12.1*    Liver Function Tests: No results for input(s): AST, ALT, ALKPHOS, BILITOT, PROT, ALBUMIN in the last 168 hours. No results for input(s): LIPASE, AMYLASE in the last 168 hours. No results for input(s): AMMONIA in the last 168 hours.  ABG    Component Value Date/Time   PHART 7.348 (L) 01/04/2021 0919   PCO2ART 54.1 (H) 01/04/2021 0919   PO2ART 85 01/04/2021 0919   HCO3 29.8 (H) 01/04/2021 0919   TCO2 31 01/04/2021 0919   O2SAT 96.0 01/04/2021 0919     Coagulation Profile: No results for input(s): INR, PROTIME in the last 168 hours.  Cardiac Enzymes: No results for input(s): CKTOTAL, CKMB, CKMBINDEX, TROPONINI in the last 168 hours.  HbA1C: No results found for: HGBA1C  CBG: Recent Labs  Lab 01/04/21 1541 01/04/21 1926 01/04/21 2319 01/05/21 0319 01/05/21 0731  GLUCAP 149* 161* 153* 146* 122*    Review of Systems:   Unable to obtain, intubated, chemically paralyzed  Past Medical History:  He,  has a past medical history of Cardiomyopathy (HCC) (06/2017), Chronic renal insufficiency, stage 3 (moderate)  (HCC), Diastolic dysfunction (06/2017), Hypertension, Hypertensive urgency, and Obesity.   Surgical History:  History reviewed. No pertinent surgical history.   Social History:   reports that he has been smoking cigarettes. He has been smoking an average of 1 pack per day. He has never used smokeless tobacco. He reports current alcohol use of about 1.0 standard drink per week. He reports current drug use. Drug: Marijuana.   Family History:  His family history  includes Diabetes in his father and mother.   Allergies Allergies  Allergen Reactions   Ace Inhibitors Anaphylaxis   Black Cherry Fruit Extract [Cherry Extract] Anaphylaxis    Tongue     Home Medications  Prior to Admission medications   Medication Sig Start Date End Date Taking? Authorizing Provider  ADVAIR HFA 115-21 MCG/ACT inhaler INHALE 2 PUFFS INTO THE LUNGS 2 (TWO) TIMES DAILY. 07/23/20   Hoy Register, MD  albuterol (VENTOLIN HFA) 108 (90 Base) MCG/ACT inhaler Inhale 2 puffs into the lungs every 6 (six) hours as needed for wheezing or shortness of breath (Shortness of breath). Patient not taking: Reported on 12/16/2020 04/18/19   Storm Frisk, MD  albuterol (VENTOLIN HFA) 108 (90 Base) MCG/ACT inhaler Inhale 2 puffs into the lungs every 4 (four) hours as needed for wheezing or shortness of breath. 10/17/20   Harris, Abigail, PA-C  amLODipine (NORVASC) 10 MG tablet TAKE 1 TABLET (10 MG TOTAL) BY MOUTH DAILY. Patient taking differently: Take 10 mg by mouth daily. 11/06/19 12/16/20  Abelino Derrick, PA-C  amoxicillin (AMOXIL) 500 MG capsule Take 1 capsule (500 mg total) by mouth 3 (three) times daily. Patient not taking: No sig reported 06/26/20   Marcine Matar, MD  amoxicillin-clavulanate (AUGMENTIN) 500-125 MG tablet TAKE 1 TABLET (500 MG TOTAL) BY MOUTH 2 (TWO) TIMES DAILY WITH A MEAL. Patient not taking: No sig reported 07/14/20 07/14/21  Marcine Matar, MD  apixaban (ELIQUIS) 5 MG TABS tablet Take 1 tablet (5 mg  total) by mouth 2 (two) times daily. 06/02/20 12/16/20  Fenton, Clint R, PA  atorvastatin (LIPITOR) 10 MG tablet TAKE 1 TABLET (10 MG TOTAL) BY MOUTH DAILY. Patient taking differently: Take 10 mg by mouth daily. 11/06/19 12/16/20  Abelino Derrick, PA-C  carvedilol (COREG) 3.125 MG tablet TAKE 1 TABLET (3.125 MG TOTAL) BY MOUTH 2 (TWO) TIMES DAILY WITH A MEAL. Patient taking differently: Take 3.125 mg by mouth 2 (two) times daily with a meal. 11/06/19 12/16/20  Kilroy, Eda Paschal, PA-C  fluticasone (FLONASE) 50 MCG/ACT nasal spray PLACE 2 SPRAYS INTO BOTH NOSTRILS DAILY. Patient taking differently: Place 2 sprays into both nostrils daily as needed for allergies. 06/26/20 06/26/21  Marcine Matar, MD  furosemide (LASIX) 40 MG tablet TAKE 1 TABLET (40 MG TOTAL) BY MOUTH DAILY. Patient taking differently: Take 40 mg by mouth daily. 11/06/19 12/16/20  Abelino Derrick, PA-C  hydrALAZINE (APRESOLINE) 50 MG tablet TAKE 1 TABLET (50 MG TOTAL) BY MOUTH 3 (THREE) TIMES DAILY. Patient taking differently: Take 50 mg by mouth 3 (three) times daily. 11/06/19 11/05/20  Abelino Derrick, PA-C  isosorbide dinitrate (ISORDIL) 20 MG tablet TAKE 1 TABLET (20 MG TOTAL) BY MOUTH 3 (THREE) TIMES DAILY. SCHEDULE OFFICE VISIT Patient taking differently: Take 20 mg by mouth 3 (three) times daily. 10/17/20 10/17/21  Harris, Cammy Copa, PA-C  loratadine (CLARITIN) 10 MG tablet TAKE 1 TABLET (10 MG TOTAL) BY MOUTH DAILY. Patient not taking: No sig reported 06/26/20 06/26/21  Marcine Matar, MD  methylPREDNISolone (MEDROL DOSEPAK) 4 MG TBPK tablet Use as directed Patient not taking: No sig reported 10/17/20   Arthor Captain, PA-C  sodium chloride (OCEAN) 0.65 % SOLN nasal spray Place 2 sprays into both nostrils 3 (three) times daily. Patient taking differently: Place 2 sprays into both nostrils as needed for congestion. 04/18/19   Storm Frisk, MD     Dolan Amen, MD IMTS, PGY-3 Pager: [phone] 01/05/2021,8:04 AM

## 2021-01-05 NOTE — Progress Notes (Signed)
Initial Nutrition Assessment  DOCUMENTATION CODES:   Morbid obesity  INTERVENTION:   Initiate tube feeding via OG tube: Vital High Protein at 40 ml/h (960 ml per day) Prosource TF 90 ml QID  Provides 1280 kcal (2191 kcal total with propofol), 172 gm protein, 803 ml free water daily.  MVI with minerals via tube daily.  NUTRITION DIAGNOSIS:   Inadequate oral intake related to inability to eat as evidenced by NPO status.  GOAL:   Provide needs based on ASPEN/SCCM guidelines  MONITOR:   Vent status, Labs, TF tolerance  REASON FOR ASSESSMENT:   Ventilator, Consult Enteral/tube feeding initiation and management  ASSESSMENT:   65 yo male admitted with angioedema r/t ACE-I, black cherry mediated anaphylaxis. PMH includes cardiomyopathy, chronic renal insufficiency, HTN, smoker, HF, A fib, BPH.  Intubated on admission. Urine tox screen positive for cocaine and THC.   Received MD Consult for TF initiation and management. OG tube in place.   Patient is currently intubated on ventilator support MV: 10.1 L/min Temp (24hrs), Avg:98.3 F (36.8 C), Min:97.7 F (36.5 C), Max:98.7 F (37.1 C)  Propofol: 34.5 ml/hr providing 911 kcal from lipid   Weight history reviewed. No significant weight changes noted.   Labs reviewed.  CBG: [phone]  Medications reviewed and include Colace, Pepcid, Solumedrol, Miralax, propofol.   NUTRITION - FOCUSED PHYSICAL EXAM:  Unable to complete  Diet Order:   Diet Order             Diet NPO time specified  Diet effective now                   EDUCATION NEEDS:   Not appropriate for education at this time  Skin:  Skin Assessment: Reviewed RN Assessment  Last BM:  PTA  Height:   Ht Readings from Last 1 Encounters:  01/04/21 6' (1.829 m)    Weight:   Wt Readings from Last 1 Encounters:  01/05/21 (!) 140.9 kg    Ideal Body Weight:  80.9 kg  BMI:  Body mass index is 42.13 kg/m.  Estimated Nutritional Needs:    Kcal:  7026-3785  Protein:  160-200 gm  Fluid:  >/= 2 L    Gabriel Rainwater, RD, LDN, CNSC Please refer to Amion for contact information.

## 2021-01-05 NOTE — Progress Notes (Signed)
Attending:    Subjective: Presented to [facility] with tongue swelling, had respiratory distress, intubated Overnight no acute issues CBC > slight drop in Hgb overnight No fever  Past Medical History:  Diagnosis Date   Cardiomyopathy (HCC) 06/2017   EF 40-45%   Chronic renal insufficiency, stage 3 (moderate) (HCC)    Diastolic dysfunction 06/2017   grade 2    Hypertension    Hypertensive urgency    Obesity      Objective: Vitals:   01/05/21 0700 01/05/21 0755 01/05/21 0838 01/05/21 0857  BP: 106/80     Pulse: 65 66 72 84  Resp: 20 20 20  (!) 22  Temp: 97.7 F (36.5 C)     TempSrc: Oral     SpO2: 96% 97% 96% 91%  Weight:      Height:       Vent Mode: PRVC FiO2 (%):  [40 %] 40 % Set Rate:  [20 bmp] 20 bmp Vt Set:  [500 mL] 500 mL PEEP:  [5 cmH20] 5 cmH20 Plateau Pressure:  [20 cmH20-23 cmH20] 22 cmH20  Intake/Output Summary (Last 24 hours) at 01/05/2021 0908 Last data filed at 01/05/2021 0700 Gross per 24 hour  Intake 1154.62 ml  Output 600 ml  Net 554.62 ml    General:  In bed on vent HENT: NCAT ETT in place PULM: CTA B, vent supported breathing CV: RRR, no mgr GI: BS+, soft, nontender MSK: normal bulk and tone Neuro: sedated on vent    CBC    Component Value Date/Time   WBC 12.1 (H) 01/05/2021 0645   RBC 5.08 01/05/2021 0645   HGB 14.4 01/05/2021 0645   HGB 16.7 07/11/2020 1103   HCT 45.4 01/05/2021 0645   HCT 50.3 07/11/2020 1103   PLT 238 01/05/2021 0645   PLT 232 07/11/2020 1103   MCV 89.4 01/05/2021 0645   MCV 88 07/11/2020 1103   MCH 28.3 01/05/2021 0645   MCHC 31.7 01/05/2021 0645   RDW 15.2 01/05/2021 0645   RDW 13.6 07/11/2020 1103   LYMPHSABS 3.1 12/16/2020 0022   MONOABS 0.9 12/16/2020 0022   EOSABS 0.7 (H) 12/16/2020 0022   BASOSABS 0.1 12/16/2020 0022    BMET    Component Value Date/Time   NA 141 01/04/2021 0919   NA 140 10/12/2018 0842   K 4.5 01/04/2021 0919   CL 105 01/04/2021 0457   CO2 25 01/04/2021 0457   GLUCOSE 107  (H) 01/04/2021 0457   BUN 29 (H) 01/04/2021 0457   BUN 24 10/12/2018 0842   CREATININE 1.44 (H) 01/04/2021 0457   CALCIUM 9.3 01/04/2021 0457   GFRNONAA 55 (L) 01/04/2021 0457   GFRAA 56 (L) 08/02/2019 0718    CXR images reviewed, no infiltrate, ETT in place  Impression/Plan: Acute respiratory failure with hypoxemia due to angioedema> tongue swelling improved, following commands, no cuff leak so cannot extubate this morning Angioedema > due to cherry intake, benadryl, solumedrol, will continue Atrial fib> heparin sub cutaneous today Tongue bleeding> resolved, could start eliquis tomorrow Diastolic heart failure and hypertension> restart home regimen coreg, amlodipine, hydralazine/ISDN to restart  CKD uncertain stage at baseline> monitor BMET   My cc time 31 minutes  08/04/2019, MD [facility] PCCM Pager: [phone] Cell: [phone] After 7pm: [phone]

## 2021-01-06 ENCOUNTER — Inpatient Hospital Stay (HOSPITAL_COMMUNITY): Payer: Medicaid Other

## 2021-01-06 DIAGNOSIS — T783XXA Angioneurotic edema, initial encounter: Secondary | ICD-10-CM | POA: Diagnosis not present

## 2021-01-06 LAB — GLUCOSE, CAPILLARY
Glucose-Capillary: 108 mg/dL — ABNORMAL HIGH (ref 70–99)
Glucose-Capillary: 134 mg/dL — ABNORMAL HIGH (ref 70–99)
Glucose-Capillary: 131 mg/dL — ABNORMAL HIGH (ref 70–99)
Glucose-Capillary: 92 mg/dL (ref 70–99)

## 2021-01-06 LAB — CBC
HCT: 44.2 % (ref 39.0–52.0)
Hemoglobin: 14.5 g/dL (ref 13.0–17.0)
MCH: 29.1 pg (ref 26.0–34.0)
MCHC: 32.8 g/dL (ref 30.0–36.0)
MCV: 88.8 fL (ref 80.0–100.0)
Platelets: 225 10*3/uL (ref 150–400)
RBC: 4.98 MIL/uL (ref 4.22–5.81)
RDW: 15.5 % (ref 11.5–15.5)
WBC: 10.8 10*3/uL — ABNORMAL HIGH (ref 4.0–10.5)
nRBC: 0 % (ref 0.0–0.2)

## 2021-01-06 LAB — BASIC METABOLIC PANEL
Anion gap: 7 (ref 5–15)
BUN: 41 mg/dL — ABNORMAL HIGH (ref 8–23)
CO2: 28 mmol/L (ref 22–32)
Calcium: 8.8 mg/dL — ABNORMAL LOW (ref 8.9–10.3)
Chloride: 105 mmol/L (ref 98–111)
Creatinine, Ser: 1.5 mg/dL — ABNORMAL HIGH (ref 0.61–1.24)
GFR, Estimated: 52 mL/min — ABNORMAL LOW (ref >60)
Glucose, Bld: 124 mg/dL — ABNORMAL HIGH (ref 70–99)
Potassium: 3.8 mmol/L (ref 3.5–5.1)
Sodium: 140 mmol/L (ref 135–145)

## 2021-01-06 LAB — PHOSPHORUS: Phosphorus: 4.4 mg/dL (ref 2.5–4.6)

## 2021-01-06 LAB — TROPONIN I (HIGH SENSITIVITY): Troponin I (High Sensitivity): 16 ng/L (ref <18)

## 2021-01-06 LAB — MAGNESIUM: Magnesium: 2.7 mg/dL — ABNORMAL HIGH (ref 1.7–2.4)

## 2021-01-06 MED ORDER — FUROSEMIDE 10 MG/ML IJ SOLN
40.0000 mg | Freq: Four times a day (QID) | INTRAMUSCULAR | Status: AC
  Administered 2021-01-06 (×2): 40 mg via INTRAVENOUS
  Filled 2021-01-06 (×2): qty 4

## 2021-01-06 MED ORDER — MIDAZOLAM HCL 2 MG/2ML IJ SOLN
INTRAMUSCULAR | Status: AC
  Filled 2021-01-06: qty 2

## 2021-01-06 MED ORDER — ADULT MULTIVITAMIN W/MINERALS CH
1.0000 | ORAL_TABLET | Freq: Every day | ORAL | Status: DC
  Administered 2021-01-07 – 2021-01-09 (×3): 1 via ORAL
  Filled 2021-01-06 (×3): qty 1

## 2021-01-06 MED ORDER — POLYETHYLENE GLYCOL 3350 17 G PO PACK
17.0000 g | PACK | Freq: Every day | ORAL | Status: DC
  Administered 2021-01-07 – 2021-01-08 (×2): 17 g via ORAL
  Filled 2021-01-06 (×2): qty 1

## 2021-01-06 MED ORDER — LABETALOL HCL 5 MG/ML IV SOLN
20.0000 mg | INTRAVENOUS | Status: DC | PRN
  Administered 2021-01-06 – 2021-01-07 (×3): 20 mg via INTRAVENOUS
  Filled 2021-01-06 (×3): qty 4

## 2021-01-06 MED ORDER — BETHANECHOL CHLORIDE 10 MG PO TABS
10.0000 mg | ORAL_TABLET | Freq: Three times a day (TID) | ORAL | Status: DC
  Administered 2021-01-06 – 2021-01-07 (×3): 10 mg via ORAL
  Filled 2021-01-06 (×3): qty 1

## 2021-01-06 MED ORDER — HYDRALAZINE HCL 50 MG PO TABS
50.0000 mg | ORAL_TABLET | Freq: Three times a day (TID) | ORAL | Status: DC
  Administered 2021-01-06 – 2021-01-09 (×9): 50 mg via ORAL
  Filled 2021-01-06 (×9): qty 1

## 2021-01-06 MED ORDER — POLYETHYLENE GLYCOL 3350 17 G PO PACK: 17.0000 g | PACK | Freq: Every day | ORAL | Status: DC | PRN

## 2021-01-06 MED ORDER — ORAL CARE MOUTH RINSE
15.0000 mL | Freq: Two times a day (BID) | OROMUCOSAL | Status: DC
  Administered 2021-01-06 – 2021-01-09 (×5): 15 mL via OROMUCOSAL

## 2021-01-06 MED ORDER — MIDAZOLAM HCL 2 MG/2ML IJ SOLN: 1.0000 mg | Freq: Once | INTRAMUSCULAR | Status: AC

## 2021-01-06 MED ORDER — DEXMEDETOMIDINE HCL IN NACL 400 MCG/100ML IV SOLN
0.4000 ug/kg/h | INTRAVENOUS | Status: AC
  Administered 2021-01-06: 0.4 ug/kg/h via INTRAVENOUS
  Filled 2021-01-06: qty 100

## 2021-01-06 MED ORDER — DOCUSATE SODIUM 100 MG PO CAPS
100.0000 mg | ORAL_CAPSULE | Freq: Two times a day (BID) | ORAL | Status: DC
  Administered 2021-01-06 – 2021-01-09 (×6): 100 mg via ORAL
  Filled 2021-01-06 (×6): qty 1

## 2021-01-06 MED ORDER — MIDAZOLAM HCL 2 MG/2ML IJ SOLN
1.0000 mg | Freq: Once | INTRAMUSCULAR | Status: AC
  Administered 2021-01-06: 1 mg via INTRAVENOUS

## 2021-01-06 MED ORDER — DOCUSATE SODIUM 50 MG/5ML PO LIQD: 100.0000 mg | Freq: Two times a day (BID) | ORAL | Status: DC

## 2021-01-06 MED ORDER — HYDRALAZINE HCL 20 MG/ML IJ SOLN
10.0000 mg | INTRAMUSCULAR | Status: DC | PRN
  Administered 2021-01-06: 10 mg via INTRAVENOUS
  Filled 2021-01-06: qty 1

## 2021-01-06 NOTE — Procedures (Signed)
Extubation Procedure Note  Patient Details:   Name: Zachary Hawkins DOB: 1955/07/04 MRN: [id number]   Airway Documentation:    Vent end date: 01/06/21 Vent end time: 1140   Evaluation  O2 sats: stable throughout Complications: No apparent complications Patient did tolerate procedure well. Bilateral Breath Sounds: Rhonchi, Diminished   Yes,  Per order, pt was extubated. Pt was extubated to 12L [place]. Prior to extubation, pt did have a positive cuff leak. After extubation pt was able to state his name and location, no stridor heard. Pt is not in any distress but does require high flow O2. MD made aware of pt. RN at bedside. RT will continue to monitor pt.  Megan Mans 01/06/2021, 12:09 PM

## 2021-01-06 NOTE — Progress Notes (Signed)
NAME:  Zachary Hawkins, MRN:  [id number], DOB:  06-17-1955, LOS: 2 ADMISSION DATE:  01/04/2021, CONSULTATION DATE:  01/04/21 REFERRING MD:  Wilkie Aye - EM, CHIEF COMPLAINT:  Angioedema    History of Present Illness:  65 yo M PMH ACE-I angioedema, black cherry mediated anaphylaxis, Afib on eliquis, systolic and diastolic HF who presented to ED 01/04/21 with SOB, tongue swelling. Sx started approx 0400 01/04/21. Ate red cherries 8/27 (day prior to presentation), unclear if this mediated current presentation.  Received epi, pepcid, benadryl solumedrol in ED.   HDS in ED, hypoxic on arrival but oxygenating well with supplemental O2. Anesthesiologist at bedside with plans to intubate for airway protection, PCCM consulted for backup in event of surgical airway, and for admission.   Labs on presentation quite benign -- Cr 1.44 which is down from 1.54 12/15/20   Pertinent  Medical History  Angioedema: ACE-I, black cherry extract  Systolic and diastolic HF Dilated cardiomyopathy HTN Afib on eliquis BPH Significant Hospital Events: Including procedures, antibiotic start and stop dates in addition to other pertinent events   8/28 to ED with facial swelling, intubated for airway protection 2/2 angioedema. Admit to ICU   Interim History / Subjective:  After weaning off sedation able to answer questions with head shaking/nodding. Denies abdominal  pain, nausea Objective   Blood pressure (!) 77/49, pulse (!) 57, temperature (!) 97.1 F (36.2 C), temperature source Axillary, resp. rate 20, height 6' (1.829 m), weight (!) 142.8 kg, SpO2 97 %.    Vent Mode: PRVC FiO2 (%):  [50 %-100 %] 50 % Set Rate:  [20 bmp] 20 bmp Vt Set:  [500 mL] 500 mL PEEP:  [5 cmH20] 5 cmH20 Plateau Pressure:  [22 cmH20-24 cmH20] 24 cmH20   Intake/Output Summary (Last 24 hours) at 01/06/2021 0756 Last data filed at 01/06/2021 0600 Gross per 24 hour  Intake 1805.37 ml  Output 985 ml  Net 820.37 ml    Filed Weights    01/04/21 0701 01/05/21 0500 01/06/21 0500  Weight: (!) 143.8 kg (!) 140.9 kg (!) 142.8 kg   Examination: Physical Exam Constitutional:      General: He is not in acute distress.    Comments: Intubated, sedated, appears comfortable.   Cardiovascular:     Rate and Rhythm: Normal rate and regular rhythm.     Pulses: Normal pulses.     Heart sounds: Normal heart sounds. No murmur heard.   No friction rub. No gallop.  Pulmonary:     Effort: Pulmonary effort is normal. No respiratory distress.     Breath sounds: Normal breath sounds. No wheezing or rhonchi.     Comments: Intubated CTAB Abdominal:     General: There is distension.     Tenderness: There is no abdominal tenderness.     Comments: Distended, hypoactive bowel sounds  Musculoskeletal:     Right lower leg: No edema.     Left lower leg: No edema.    Resolved Hospital Problem list     Assessment & Plan:   Acute Respiratory Failure with Hypoxia Requiring Intubation in Setting of Airway Compromise due to Angioedema Angioedema  Hx of OSA:  No febrile events overnight, small leukocytosis likely explained by initiating steroids. No infectious etiology appreciated on soft tissue neck imaging. Failed cuff leak 8/29, unable to intubate.  - Continue daily SBT - Maintain advanced airway and continue sedation RASS -3, prop, fent gtts  - Continue Pepcid 20 mg IV BID, solumedrol 125 mg QD, benadryl  12.5 mg TID  Afib (On Eliquis):  Hgb stable 14.5 today. - Restart Eliquis 5 mg BID, if able to extubate today.   Systolic and diastolic CHF  Dilated cardiomyopathy  HTN Pressures controlled on propofol with systolic pressures ranging in the 80s-110s. Home Regimen inclueds coreg, amlodipine, isosorbide dinitrate, hydralazine -Continue to hold home antihypertensives  - Replete K >4, Mg >2  CKD III - Trend BMP  - Avoid nephrotoxic agents  BPH  - Bethanecol  - Continue to bladder scan  Polysubstance Use:  - UDS + For Cocaine and  THC  Abdominal Distension:  Hypoactive BS with abdominal distension.  - Stop TF - DG Abdomen  - Monitor stool output  Best Practice (right click and "Reselect all SmartList Selections" daily)   Diet/type: NPO DVT prophylaxis: prophylactic heparin  GI prophylaxis: H2B Lines: N/A Foley:  N/A Code Status:  full code Last date of multidisciplinary goals of care discussion [pending]  Labs   CBC: Recent Labs  Lab 01/04/21 0457 01/04/21 0919 01/05/21 0645 01/06/21 0628  WBC 7.5  --  12.1* 10.8*  HGB 16.4 16.7 14.4 14.5  HCT 52.1* 49.0 45.4 44.2  MCV 92.0  --  89.4 88.8  PLT 209  --  238 225    Basic Metabolic Panel: Recent Labs  Lab 01/04/21 0457 01/04/21 0809 01/04/21 0919 01/05/21 0645 01/05/21 1535 01/06/21 0628  NA 139  --  141 138  --  140  K 4.3  --  4.5 4.2  --  3.8  CL 105  --   --  102  --  105  CO2 25  --   --  27  --  28  GLUCOSE 107*  --   --  126*  --  124*  BUN 29*  --   --  31*  --  41*  CREATININE 1.44*  --   --  1.56*  --  1.50*  CALCIUM 9.3  --   --  8.7*  --  8.8*  MG  --  2.4  --  2.5* 2.4 2.7*  PHOS  --   --   --  3.9 3.8 4.4   GFR: Estimated Creatinine Clearance: 73.9 mL/min (A) (by C-G formula based on SCr of 1.5 mg/dL (H)). Recent Labs  Lab 01/04/21 0457 01/04/21 0809 01/05/21 0645 01/06/21 0628  PROCALCITON  --  <0.10  --   --   WBC 7.5  --  12.1* 10.8*    Liver Function Tests: No results for input(s): AST, ALT, ALKPHOS, BILITOT, PROT, ALBUMIN in the last 168 hours. No results for input(s): LIPASE, AMYLASE in the last 168 hours. No results for input(s): AMMONIA in the last 168 hours.  ABG    Component Value Date/Time   PHART 7.348 (L) 01/04/2021 0919   PCO2ART 54.1 (H) 01/04/2021 0919   PO2ART 85 01/04/2021 0919   HCO3 29.8 (H) 01/04/2021 0919   TCO2 31 01/04/2021 0919   O2SAT 96.0 01/04/2021 0919    Coagulation Profile: No results for input(s): INR, PROTIME in the last 168 hours.  Cardiac Enzymes: No results for  input(s): CKTOTAL, CKMB, CKMBINDEX, TROPONINI in the last 168 hours.  HbA1C: No results found for: HGBA1C  CBG: Recent Labs  Lab 01/05/21 0319 01/05/21 0731 01/05/21 1133 01/05/21 1538 01/05/21 2007  GLUCAP 146* 122* 134* 134* 127*     Review of Systems:   Unable to obtain, intubated, chemically paralyzed  Past Medical History:  He,  has a past medical  history of Cardiomyopathy (HCC) (06/2017), Chronic renal insufficiency, stage 3 (moderate) (HCC), Diastolic dysfunction (06/2017), Hypertension, Hypertensive urgency, and Obesity.   Surgical History:  History reviewed. No pertinent surgical history.   Social History:   reports that he has been smoking cigarettes. He has been smoking an average of 1 pack per day. He has never used smokeless tobacco. He reports current alcohol use of about 1.0 standard drink per week. He reports current drug use. Drug: Marijuana.   Family History:  His family history includes Diabetes in his father and mother.   Allergies Allergies  Allergen Reactions   Ace Inhibitors Anaphylaxis   Black Cherry Fruit Extract [Cherry Extract] Anaphylaxis    Tongue     Home Medications  Prior to Admission medications   Medication Sig Start Date End Date Taking? Authorizing Provider  ADVAIR HFA 115-21 MCG/ACT inhaler INHALE 2 PUFFS INTO THE LUNGS 2 (TWO) TIMES DAILY. 07/23/20   Hoy Register, MD  albuterol (VENTOLIN HFA) 108 (90 Base) MCG/ACT inhaler Inhale 2 puffs into the lungs every 6 (six) hours as needed for wheezing or shortness of breath (Shortness of breath). Patient not taking: Reported on 12/16/2020 04/18/19   Storm Frisk, MD  albuterol (VENTOLIN HFA) 108 (90 Base) MCG/ACT inhaler Inhale 2 puffs into the lungs every 4 (four) hours as needed for wheezing or shortness of breath. 10/17/20   Harris, Abigail, PA-C  amLODipine (NORVASC) 10 MG tablet TAKE 1 TABLET (10 MG TOTAL) BY MOUTH DAILY. Patient taking differently: Take 10 mg by mouth daily.  11/06/19 12/16/20  Abelino Derrick, PA-C  amoxicillin (AMOXIL) 500 MG capsule Take 1 capsule (500 mg total) by mouth 3 (three) times daily. Patient not taking: No sig reported 06/26/20   Marcine Matar, MD  amoxicillin-clavulanate (AUGMENTIN) 500-125 MG tablet TAKE 1 TABLET (500 MG TOTAL) BY MOUTH 2 (TWO) TIMES DAILY WITH A MEAL. Patient not taking: No sig reported 07/14/20 07/14/21  Marcine Matar, MD  apixaban (ELIQUIS) 5 MG TABS tablet Take 1 tablet (5 mg total) by mouth 2 (two) times daily. 06/02/20 12/16/20  Fenton, Clint R, PA  atorvastatin (LIPITOR) 10 MG tablet TAKE 1 TABLET (10 MG TOTAL) BY MOUTH DAILY. Patient taking differently: Take 10 mg by mouth daily. 11/06/19 12/16/20  Abelino Derrick, PA-C  carvedilol (COREG) 3.125 MG tablet TAKE 1 TABLET (3.125 MG TOTAL) BY MOUTH 2 (TWO) TIMES DAILY WITH A MEAL. Patient taking differently: Take 3.125 mg by mouth 2 (two) times daily with a meal. 11/06/19 12/16/20  Kilroy, Eda Paschal, PA-C  fluticasone (FLONASE) 50 MCG/ACT nasal spray PLACE 2 SPRAYS INTO BOTH NOSTRILS DAILY. Patient taking differently: Place 2 sprays into both nostrils daily as needed for allergies. 06/26/20 06/26/21  Marcine Matar, MD  furosemide (LASIX) 40 MG tablet TAKE 1 TABLET (40 MG TOTAL) BY MOUTH DAILY. Patient taking differently: Take 40 mg by mouth daily. 11/06/19 12/16/20  Abelino Derrick, PA-C  hydrALAZINE (APRESOLINE) 50 MG tablet TAKE 1 TABLET (50 MG TOTAL) BY MOUTH 3 (THREE) TIMES DAILY. Patient taking differently: Take 50 mg by mouth 3 (three) times daily. 11/06/19 11/05/20  Abelino Derrick, PA-C  isosorbide dinitrate (ISORDIL) 20 MG tablet TAKE 1 TABLET (20 MG TOTAL) BY MOUTH 3 (THREE) TIMES DAILY. SCHEDULE OFFICE VISIT Patient taking differently: Take 20 mg by mouth 3 (three) times daily. 10/17/20 10/17/21  Harris, Cammy Copa, PA-C  loratadine (CLARITIN) 10 MG tablet TAKE 1 TABLET (10 MG TOTAL) BY MOUTH DAILY. Patient not taking: No sig  reported 06/26/20 06/26/21  Marcine MatarJohnson, Deborah B, MD   methylPREDNISolone (MEDROL DOSEPAK) 4 MG TBPK tablet Use as directed Patient not taking: No sig reported 10/17/20   Arthor CaptainHarris, Abigail, PA-C  sodium chloride (OCEAN) 0.65 % SOLN nasal spray Place 2 sprays into both nostrils 3 (three) times daily. Patient taking differently: Place 2 sprays into both nostrils as needed for congestion. 04/18/19   Storm FriskWright, Patrick E, MD  Dolan AmenSteven Abbygail [place], MD IMTS, PGY-3 Pager: [phone] 01/06/2021,7:56 AM

## 2021-01-06 NOTE — Evaluation (Signed)
Clinical/Bedside Swallow Evaluation Patient Details  Name: Zachary Hawkins MRN: [id number] Date of Birth: [birth year]  Today's Date: 01/06/2021 Time: SLP Start Time (ACUTE ONLY): 1422 SLP Stop Time (ACUTE ONLY): 1440 SLP Time Calculation (min) (ACUTE ONLY): 18 min  Past Medical History:  Past Medical History:  Diagnosis Date   Cardiomyopathy (HCC) 06/2017   EF 40-45%   Chronic renal insufficiency, stage 3 (moderate) (HCC)    Diastolic dysfunction 06/2017   grade 2    Hypertension    Hypertensive urgency    Obesity    Past Surgical History: History reviewed. No pertinent surgical history. HPI:  65 yo M PMH ACE-I angioedema, black cherry mediated anaphylaxis, Afib on eliquis, systolic and diastolic HF who presented to ED 01/04/21 with SOB, tongue swelling. Sx started approx 0400 01/04/21. Ate red cherries 8/27 (day prior to presentation), unclear if this mediated current presentation. ETT 8/28-8/30   Assessment / Plan / Recommendation Clinical Impression  Pts swallow presents grossly functional post extubation and despite angioedema. He reports face and throat still feel swollen but improving overall. Vocal quality mildly hoarse. Performed oral care. Pt consumed ice chips, thin liquids via cup including 3 oz water challenge (declined straw), puree, and solids without overt s/sx of aspiration. Multiple swallows noted and post swallow belching. Recommend dysphagia 3 (mechanical soft) and thin liquids with meds in puree. SLP to follow up for diet advancement.  SLP Visit Diagnosis: Dysphagia, unspecified (R13.10)    Aspiration Risk  Mild aspiration risk    Diet Recommendation  Dysphagia 3 (mechanical soft) thin liquids  Medication Administration: Whole meds with puree    Other  Recommendations Oral Care Recommendations: Oral care BID   Follow up Recommendations Other (comment) (TBD)      Frequency and Duration min 2x/week  1 week       Prognosis Prognosis for Safe Diet  Advancement: Good Barriers to Reach Goals: Time post onset      Swallow Study   General Date of Onset: 01/04/21 HPI: 65 yo M PMH ACE-I angioedema, black cherry mediated anaphylaxis, Afib on eliquis, systolic and diastolic HF who presented to ED 01/04/21 with SOB, tongue swelling. Sx started approx 0400 01/04/21. Ate red cherries 8/27 (day prior to presentation), unclear if this mediated current presentation. ETT 8/28-8/30 Type of Study: Bedside Swallow Evaluation Previous Swallow Assessment: none on file Diet Prior to this Study: NPO Temperature Spikes Noted: No Respiratory Status: Nasal cannula History of Recent Intubation: Yes Length of Intubations (days): 2 days Date extubated: 01/06/21 Behavior/Cognition: Cooperative;Alert Oral Cavity Assessment: Dry Oral Care Completed by SLP: Yes Oral Cavity - Dentition: Adequate natural dentition Vision: Functional for self-feeding Self-Feeding Abilities: Needs set up Patient Positioning: Upright in bed Baseline Vocal Quality: Hoarse Volitional Swallow: Able to elicit    Oral/Motor/Sensory Function Overall Oral Motor/Sensory Function: Within functional limits   Ice Chips Ice chips: Within functional limits Presentation: Spoon   Thin Liquid Thin Liquid: Impaired Presentation: Cup;Straw Pharyngeal  Phase Impairments: Suspected delayed Swallow;Multiple swallows (belching)    Nectar Thick Nectar Thick Liquid: Not tested   Honey Thick Honey Thick Liquid: Not tested   Puree Puree: Within functional limits   Solid     Solid: Impaired Presentation: Self Fed Oral Phase Functional Implications: Prolonged oral transit Pharyngeal Phase Impairments: Suspected delayed Swallow;Multiple swallows      Zachary Hawkins H. MA, CCC-SLP Acute Rehabilitation Services   01/06/2021,2:48 PM

## 2021-01-06 NOTE — Progress Notes (Signed)
RN called RT stating that was biting ETT. RT at bedside, and placed bite block to prevent pt biting ETT.

## 2021-01-06 NOTE — Progress Notes (Signed)
Attending:    Subjective: No acute events Blood pressure a little low Mg OK Afebrile Tongue appears improved Weaning sedation  Objective: Vitals:   01/06/21 0815 01/06/21 0830 01/06/21 0845 01/06/21 0900  BP: (!) 95/56 106/65 109/66 (!) 98/59  Pulse: 72 69 91 80  Resp: 20 20 20 15   Temp:      TempSrc:      SpO2: 95% 95% 91% 90%  Weight:      Height:       Vent Mode: PRVC FiO2 (%):  [40 %-100 %] 100 % Set Rate:  [20 bmp] 20 bmp Vt Set:  [500 mL] 500 mL PEEP:  [5 cmH20] 5 cmH20 Plateau Pressure:  [22 cmH20-26 cmH20] 26 cmH20  Intake/Output Summary (Last 24 hours) at 01/06/2021 0926 Last data filed at 01/06/2021 0900 Gross per 24 hour  Intake 2028.14 ml  Output 1260 ml  Net 768.14 ml    General:  In bed on vent HENT: NCAT ETT in place, tongue swelling improved PULM: CTA B, vent supported breathing CV: RRR, no mgr GI: BS+, soft, nontender MSK: normal bulk and tone Neuro: sedated on vent    CBC    Component Value Date/Time   WBC 10.8 (H) 01/06/2021 0628   RBC 4.98 01/06/2021 0628   HGB 14.5 01/06/2021 0628   HGB 16.7 07/11/2020 1103   HCT 44.2 01/06/2021 0628   HCT 50.3 07/11/2020 1103   PLT 225 01/06/2021 0628   PLT 232 07/11/2020 1103   MCV 88.8 01/06/2021 0628   MCV 88 07/11/2020 1103   MCH 29.1 01/06/2021 0628   MCHC 32.8 01/06/2021 0628   RDW 15.5 01/06/2021 0628   RDW 13.6 07/11/2020 1103   LYMPHSABS 3.1 12/16/2020 0022   MONOABS 0.9 12/16/2020 0022   EOSABS 0.7 (H) 12/16/2020 0022   BASOSABS 0.1 12/16/2020 0022    BMET    Component Value Date/Time   NA 140 01/06/2021 0628   NA 140 10/12/2018 0842   K 3.8 01/06/2021 0628   CL 105 01/06/2021 0628   CO2 28 01/06/2021 0628   GLUCOSE 124 (H) 01/06/2021 0628   BUN 41 (H) 01/06/2021 0628   BUN 24 10/12/2018 0842   CREATININE 1.50 (H) 01/06/2021 0628   CALCIUM 8.8 (L) 01/06/2021 0628   GFRNONAA 52 (L) 01/06/2021 0628   GFRAA 56 (L) 08/02/2019 0718    CXR images none today but lungs are  clear on the KUB  Impression/Plan: Abdominal distension/constipation> monitor stool output Angioedema in setting of food allergy, cocaine abuse > has a good cuff leak today, will start SBT and extubate if stable on SBT; will need allergy follow up and d/c with epi pen Hypertension: continue to holdBP meds for now Atrial fibrillation> plan restarting eliquis today Oliguria > resolved   My cc time 30 minutes  08/04/2019, MD [place] PCCM Pager: [phone] Cell: [phone] After 7pm: [phone]

## 2021-01-07 DIAGNOSIS — T783XXA Angioneurotic edema, initial encounter: Secondary | ICD-10-CM | POA: Diagnosis not present

## 2021-01-07 LAB — BASIC METABOLIC PANEL
Anion gap: 9 (ref 5–15)
BUN: 34 mg/dL — ABNORMAL HIGH (ref 8–23)
CO2: 28 mmol/L (ref 22–32)
Calcium: 8.5 mg/dL — ABNORMAL LOW (ref 8.9–10.3)
Chloride: 102 mmol/L (ref 98–111)
Creatinine, Ser: 1.42 mg/dL — ABNORMAL HIGH (ref 0.61–1.24)
GFR, Estimated: 56 mL/min — ABNORMAL LOW (ref >60)
Glucose, Bld: 95 mg/dL (ref 70–99)
Potassium: 4.1 mmol/L (ref 3.5–5.1)
Sodium: 139 mmol/L (ref 135–145)

## 2021-01-07 LAB — CBC
HCT: 47.9 % (ref 39.0–52.0)
Hemoglobin: 15.5 g/dL (ref 13.0–17.0)
MCH: 29.1 pg (ref 26.0–34.0)
MCHC: 32.4 g/dL (ref 30.0–36.0)
MCV: 89.9 fL (ref 80.0–100.0)
Platelets: 226 10*3/uL (ref 150–400)
RBC: 5.33 MIL/uL (ref 4.22–5.81)
RDW: 15.3 % (ref 11.5–15.5)
WBC: 11.2 10*3/uL — ABNORMAL HIGH (ref 4.0–10.5)
nRBC: 0 % (ref 0.0–0.2)

## 2021-01-07 LAB — GLUCOSE, CAPILLARY
Glucose-Capillary: 102 mg/dL — ABNORMAL HIGH (ref 70–99)
Glucose-Capillary: 129 mg/dL — ABNORMAL HIGH (ref 70–99)
Glucose-Capillary: 148 mg/dL — ABNORMAL HIGH (ref 70–99)
Glucose-Capillary: 148 mg/dL — ABNORMAL HIGH (ref 70–99)

## 2021-01-07 LAB — MAGNESIUM: Magnesium: 2.5 mg/dL — ABNORMAL HIGH (ref 1.7–2.4)

## 2021-01-07 MED ORDER — TAMSULOSIN HCL 0.4 MG PO CAPS
0.4000 mg | ORAL_CAPSULE | Freq: Every day | ORAL | Status: DC
  Administered 2021-01-07 – 2021-01-08 (×2): 0.4 mg via ORAL
  Filled 2021-01-07 (×2): qty 1

## 2021-01-07 MED ORDER — PREDNISONE 20 MG PO TABS
60.0000 mg | ORAL_TABLET | Freq: Every day | ORAL | Status: DC
  Administered 2021-01-07 – 2021-01-09 (×3): 60 mg via ORAL
  Filled 2021-01-07 (×3): qty 3

## 2021-01-07 MED ORDER — FUROSEMIDE 10 MG/ML IJ SOLN
40.0000 mg | Freq: Once | INTRAMUSCULAR | Status: AC
  Administered 2021-01-07: 40 mg via INTRAVENOUS
  Filled 2021-01-07: qty 4

## 2021-01-07 MED ORDER — FAMOTIDINE 20 MG PO TABS
20.0000 mg | ORAL_TABLET | Freq: Two times a day (BID) | ORAL | Status: DC
  Administered 2021-01-07 – 2021-01-09 (×5): 20 mg via ORAL
  Filled 2021-01-07 (×5): qty 1

## 2021-01-07 MED ORDER — AMLODIPINE BESYLATE 10 MG PO TABS
10.0000 mg | ORAL_TABLET | Freq: Every day | ORAL | Status: DC
  Administered 2021-01-07 – 2021-01-09 (×3): 10 mg via ORAL
  Filled 2021-01-07 (×3): qty 1

## 2021-01-07 MED ORDER — APIXABAN 5 MG PO TABS
5.0000 mg | ORAL_TABLET | Freq: Two times a day (BID) | ORAL | Status: DC
  Administered 2021-01-07 – 2021-01-09 (×5): 5 mg via ORAL
  Filled 2021-01-07 (×5): qty 1

## 2021-01-07 MED ORDER — DIPHENHYDRAMINE HCL 25 MG PO CAPS
50.0000 mg | ORAL_CAPSULE | Freq: Three times a day (TID) | ORAL | Status: DC
  Administered 2021-01-07 – 2021-01-08 (×3): 50 mg via ORAL
  Filled 2021-01-07 (×3): qty 2

## 2021-01-07 NOTE — Evaluation (Signed)
Occupational Therapy Evaluation Patient Details Name: Zachary Hawkins MRN: [id number] DOB: 04/17/1956 Today's Date: 01/07/2021    History of Present Illness 65 year old man admitted 01/04/21 due to acute hypoxic respiratory failure due to anaphylaxis requiring emergent intubation (intubated 8/28-8/30).  PMh includes Cardiomyopathy (06/2017), Chronic renal insufficiency (stage 3), Diastolic dysfunction, Hypertension, and Obesity.   Clinical Impression   Pt is typically independent at baseline. Today he is overall min A for ADL and transfers with decreased safety awareness due to posterior lean (and no awareness). Pt is very pleasant and motivated. Anticipate that he will progress well. OT will follow acutely but no OT post-acute indicated at this time. Next session to focus on energy conservation education (please take handout) and OOB activity.   Pt initially on 5L O2 via [place], on RA able to maintain >90% in seated position, with activity drops to high 80's. Left seated in room on 3L - RN aware    Follow Up Recommendations  No OT follow up    Equipment Recommendations  None recommended by OT    Recommendations for Other Services       Precautions / Restrictions Precautions Precautions: Fall Restrictions Weight Bearing Restrictions: No      Mobility Bed Mobility Overal bed mobility: Needs Assistance Bed Mobility: Supine to Sit     Supine to sit: Supervision;HOB elevated     General bed mobility comments: use of rail to assist    Transfers Overall transfer level: Needs assistance Equipment used: None (did push IV pole) Transfers: Sit to/from Stand;Stand Pivot Transfers Sit to Stand: Min assist Stand pivot transfers: Min assist       General transfer comment: min A for balance and safety - good power up    Balance Overall balance assessment: Needs assistance Sitting-balance support: Single extremity supported;Feet supported Sitting balance-Leahy Scale: Good Sitting  balance - Comments: sitting EOB Postural control: Posterior lean (in standing) Standing balance support: Single extremity supported Standing balance-Leahy Scale: Fair Standing balance comment: after initial standing Pt with posterior LOB requiring therapist intervention and returning to seated on bed                           ADL either performed or assessed with clinical judgement   ADL Overall ADL's : Needs assistance/impaired Eating/Feeding: Independent;Sitting   Grooming: Wash/dry face;Set up;Sitting   Upper Body Bathing: Min guard   Lower Body Bathing: Min guard   Upper Body Dressing : Set up;Sitting Upper Body Dressing Details (indicate cue type and reason): to don second gown Lower Body Dressing: Min guard;Sitting/lateral leans Lower Body Dressing Details (indicate cue type and reason): able to perform figure 4 to doff/don socks Toilet Transfer: Minimal assistance;+2 for safety/equipment;Ambulation   Toileting- Clothing Manipulation and Hygiene: Minimal assistance;Sit to/from stand       Functional mobility during ADLs: Minimal assistance;+2 for safety/equipment (pushed IV pole) General ADL Comments: posterior lean and lack of awareness initially     Vision Patient Visual Report: No change from baseline       Perception     Praxis      Pertinent Vitals/Pain Pain Assessment: No/denies pain     Hand Dominance Right   Extremity/Trunk Assessment Upper Extremity Assessment Upper Extremity Assessment: Overall WFL for tasks assessed   Lower Extremity Assessment Lower Extremity Assessment: Defer to PT evaluation   Cervical / Trunk Assessment Cervical / Trunk Assessment: Normal;Other exceptions Cervical / Trunk Exceptions: large body habitus  Communication Communication Communication: No difficulties   Cognition Arousal/Alertness: Awake/alert Behavior During Therapy: WFL for tasks assessed/performed Overall Cognitive Status: Impaired/Different  from baseline Area of Impairment: Safety/judgement;Awareness                         Safety/Judgement: Decreased awareness of safety;Decreased awareness of deficits Awareness: Emergent   General Comments: LOB and decreased awareness of posterior lean   General Comments  initially Pt on 5L O2 when therapy team entered the room. Pt on RA throughout and dropped to 88% on RA. Left in the chair on 3L and saturations at 95%    Exercises     Shoulder Instructions      Home Living Family/patient expects to be discharged to:: Private residence Living Arrangements: Alone Available Help at Discharge: Family Type of Home: Apartment Home Access: Level entry     Home Layout: One level     Bathroom Shower/Tub: Chief Strategy Officer: Standard     Home Equipment: Cane - single point          Prior Functioning/Environment Level of Independence: Independent        Comments: enjoys spending time with his 16 kids and 2 great grandkids        OT Problem List: Decreased activity tolerance;Impaired balance (sitting and/or standing);Decreased safety awareness;Obesity      OT Treatment/Interventions: Self-care/ADL training;Energy conservation;Therapeutic activities;Patient/family education;Balance training    OT Goals(Current goals can be found in the care plan section) Acute Rehab OT Goals Patient Stated Goal: to stop smoking OT Goal Formulation: With patient Time For Goal Achievement: 01/21/21 Potential to Achieve Goals: Good ADL Goals Pt Will Perform Grooming: with modified independence;standing Pt Will Perform Upper Body Dressing: with modified independence;sitting Pt Will Perform Lower Body Dressing: with modified independence;sit to/from stand Pt Will Transfer to Toilet: with modified independence;ambulating Pt Will Perform Toileting - Clothing Manipulation and hygiene: with modified independence;sit to/from stand  OT Frequency: Min 2X/week    Barriers to D/C:            Co-evaluation PT/OT/SLP Co-Evaluation/Treatment: Yes Reason for Co-Treatment: To address functional/ADL transfers;For patient/therapist safety;Other (comment) (body size) PT goals addressed during session: Mobility/safety with mobility;Balance;Strengthening/ROM OT goals addressed during session: ADL's and self-care      AM-PAC OT "6 Clicks" Daily Activity     Outcome Measure Help from another person eating meals?: None Help from another person taking care of personal grooming?: A Little Help from another person toileting, which includes using toliet, bedpan, or urinal?: A Little Help from another person bathing (including washing, rinsing, drying)?: A Little Help from another person to put on and taking off regular upper body clothing?: A Little Help from another person to put on and taking off regular lower body clothing?: A Little 6 Click Score: 19   End of Session Equipment Utilized During Treatment: Gait belt;Oxygen (3L at end of session) Nurse Communication: Mobility status;Precautions  Activity Tolerance: Patient tolerated treatment well Patient left: in chair;with call bell/phone within reach;with chair alarm set  OT Visit Diagnosis: Unsteadiness on feet (R26.81);Other abnormalities of gait and mobility (R26.89)                Time: 4098-1191 OT Time Calculation (min): 33 min Charges:  OT General Charges $OT Visit: 1 Visit OT Evaluation $OT Eval Moderate Complexity: 1 Mod  Nyoka Cowden OTR/L Acute Rehabilitation Services Pager: [phone] Office: [phone]  Evern Bio Olon Russ 01/07/2021, 12:16 PM

## 2021-01-07 NOTE — Progress Notes (Signed)
Nutrition Follow-up  DOCUMENTATION CODES:   Morbid obesity  INTERVENTION:   Continue dysphagia 3 diet with thin liquids. Per SLP, can upgrade to regular textures when patient is ready.  No PO supplements needed at this time as intake of meals is excellent. Continue MVI with minerals daily.  NUTRITION DIAGNOSIS:   Inadequate oral intake related to inability to eat as evidenced by NPO status.  Resolved   GOAL:   Patient will meet greater than or equal to 90% of their needs  Met with intake of meals  MONITOR:   PO intake  REASON FOR ASSESSMENT:   Ventilator, Consult Enteral/tube feeding initiation and management  ASSESSMENT:   64 yo male admitted with angioedema r/t ACE-I, black cherry mediated anaphylaxis. PMH includes cardiomyopathy, chronic renal insufficiency, HTN, smoker, HF, A fib, BPH.  Patient was extubated 8/30. Diet advanced to dysphagia 3 with thin liquids per SLP. Patient states he is not ready for upgrade to regular textures. Meal intakes documented at 100%.  Labs reviewed. Mag 2.5 CBG: 102-129  Medications reviewed and include Colace, Pepcid, MVI with minerals, Miralax, prednisone, Flomax.  Admission weight 143.8 kg Current weight 138.2 kg I/O -2.7 L  Diet Order:   Diet Order             DIET DYS 3 Room service appropriate? Yes; Fluid consistency: Thin  Diet effective now                   EDUCATION NEEDS:   Not appropriate for education at this time  Skin:  Skin Assessment: Reviewed RN Assessment  Last BM:  PTA  Height:   Ht Readings from Last 1 Encounters:  01/05/21 6' (1.829 m)    Weight:   Wt Readings from Last 1 Encounters:  01/07/21 (!) 138.2 kg    Ideal Body Weight:  80.9 kg  BMI:  Body mass index is 41.32 kg/m.  Estimated Nutritional Needs:   Kcal:  2200-2400  Protein:  135-150 gm  Fluid:  >/= 2.2 L    Lucas Mallow, RD, LDN, CNSC Please refer to Amion for contact information.

## 2021-01-07 NOTE — Progress Notes (Signed)
NAME:  Zachary Hawkins, MRN:  [id number], DOB:  [birth year], LOS: 3 ADMISSION DATE:  01/04/2021, CONSULTATION DATE:  01/04/21 REFERRING MD:  Wilkie Aye - EM, CHIEF COMPLAINT:  Angioedema    History of Present Illness:  65 yo M PMH ACE-I angioedema, black cherry mediated anaphylaxis, Afib on eliquis, systolic and diastolic HF who presented to ED 01/04/21 with SOB, tongue swelling. Sx started approx 0400 01/04/21. Ate red cherries 8/27 (day prior to presentation), unclear if this mediated current presentation.  Received epi, pepcid, benadryl solumedrol in ED.   HDS in ED, hypoxic on arrival but oxygenating well with supplemental O2. Anesthesiologist at bedside with plans to intubate for airway protection, PCCM consulted for backup in event of surgical airway, and for admission.  Pertinent  Medical History  Angioedema: ACE-I, black cherry extract  Systolic and diastolic HF Dilated cardiomyopathy HTN Afib on eliquis BPH Significant Hospital Events: Including procedures, antibiotic start and stop dates in addition to other pertinent events   8/28 to ED with facial swelling, intubated for airway protection 2/2 angioedema. Admit to ICU  8/30 Successfully extubated  Interim History / Subjective:  Zachary Hawkins was seen on rounds this AM,  Objective   Blood pressure (!) 157/95, pulse 81, temperature 98 F (36.7 C), temperature source Oral, resp. rate 16, height 6' (1.829 m), weight (!) 138.2 kg, SpO2 94 %.    Vent Mode: PSV;CPAP FiO2 (%):  [40 %-100 %] (P) 40 % Set Rate:  [20 bmp] 20 bmp Vt Set:  [500 mL] 500 mL PEEP:  [5 cmH20] 5 cmH20 Pressure Support:  [10 cmH20] 10 cmH20 Plateau Pressure:  [26 cmH20] 26 cmH20   Intake/Output Summary (Last 24 hours) at 01/07/2021 0717 Last data filed at 01/07/2021 0000 Gross per 24 hour  Intake 1092.22 ml  Output 4400 ml  Net -3307.78 ml    Filed Weights   01/05/21 0500 01/06/21 0500 01/07/21 0411  Weight: (!) 140.9 kg (!) 142.8 kg (!) 138.2 kg    Examination:  Physical Exam Constitutional:      Appearance: He is obese.  HENT:     Head:     Comments: Lips are edematous, tongue swelling much improved Cardiovascular:     Rate and Rhythm: Normal rate and regular rhythm.     Pulses: Normal pulses.     Heart sounds: Normal heart sounds. No murmur heard.   No friction rub. No gallop.  Pulmonary:     Effort: Pulmonary effort is normal.     Breath sounds: No wheezing or rhonchi.     Comments: Saturating at 94% on 6L HFNC, Trace crackles appreciated at the lower lung fields bilaterally Abdominal:     Comments: Distended, hypoactive BS, passing flatus during examination  Musculoskeletal:     Right lower leg: No edema.     Left lower leg: No edema.  Neurological:     Mental Status: He is alert.    Resolved Hospital Problem list   Acute Respiratory Failure with Hypoxia Requiring Intubation in Setting of Airway Compromise due to Angioedema  Assessment & Plan:   Angioedema  Hx of OSA:  Successfully extubated 8/31, this am currently on HFNC 6L.  - Continue Pepcid 20 BID  - Prednisone 60 mg QD,  - Benadryl 50 mg TID  Afib (On Eliquis):  - Eliquis 5 mg BID   Systolic and diastolic CHF  Dilated cardiomyopathy  HTN Pressures controlled on propofol with systolic pressures ranging in the 80s-110s. Home Regimen inclueds coreg, amlodipine,  isosorbide dinitrate, hydralazine. Requiring HFNC after extubation, faint crackles appreciated on physical exam, will diurese today.  - Continue Hydralazine 50 mg TID.  - Restart amlodipine 10 mg QD - Holding Coreg given cocaine use - Replete K >4, Mg >2 - Lasix 40 mg IV  CKD III Having good urine output, sCr 1.42 this AM - Trend BMP  - Avoid nephrotoxic agents  BPH  - Discontinue Bethanecol  - Start Flomax 0.4 mg daily   Polysubstance Use:  - UDS + For Cocaine and THC, past UDS + cocaine  Abdominal Distension:  Continue to have tense and hypoactive BS, likely in setting of  opiates. Will restart bowel regimen today.  - Scheduled Miralax - Monitor stool output  Best Practice (right click and "Reselect all SmartList Selections" daily)   Diet/type: Dys III DVT prophylaxis: prophylactic heparin  GI prophylaxis: H2B Lines: N/A Foley:  N/A Code Status:  full code Last date of multidisciplinary goals of care discussion [pending]  Labs   CBC: Recent Labs  Lab 01/04/21 0457 01/04/21 0919 01/05/21 0645 01/06/21 0628 01/07/21 0036  WBC 7.5  --  12.1* 10.8* 11.2*  HGB 16.4 16.7 14.4 14.5 15.5  HCT 52.1* 49.0 45.4 44.2 47.9  MCV 92.0  --  89.4 88.8 89.9  PLT 209  --  238 225 226    Basic Metabolic Panel: Recent Labs  Lab 01/04/21 0457 01/04/21 0809 01/04/21 0919 01/05/21 0645 01/05/21 1535 01/06/21 0628 01/07/21 0036  NA 139  --  141 138  --  140 139  K 4.3  --  4.5 4.2  --  3.8 4.1  CL 105  --   --  102  --  105 102  CO2 25  --   --  27  --  28 28  GLUCOSE 107*  --   --  126*  --  124* 95  BUN 29*  --   --  31*  --  41* 34*  CREATININE 1.44*  --   --  1.56*  --  1.50* 1.42*  CALCIUM 9.3  --   --  8.7*  --  8.8* 8.5*  MG  --  2.4  --  2.5* 2.4 2.7* 2.5*  PHOS  --   --   --  3.9 3.8 4.4  --    GFR: Estimated Creatinine Clearance: 76.7 mL/min (A) (by C-G formula based on SCr of 1.42 mg/dL (H)). Recent Labs  Lab 01/04/21 0457 01/04/21 0809 01/05/21 0645 01/06/21 0628 01/07/21 0036  PROCALCITON  --  <0.10  --   --   --   WBC 7.5  --  12.1* 10.8* 11.2*    Liver Function Tests: No results for input(s): AST, ALT, ALKPHOS, BILITOT, PROT, ALBUMIN in the last 168 hours. No results for input(s): LIPASE, AMYLASE in the last 168 hours. No results for input(s): AMMONIA in the last 168 hours.  ABG    Component Value Date/Time   PHART 7.348 (L) 01/04/2021 0919   PCO2ART 54.1 (H) 01/04/2021 0919   PO2ART 85 01/04/2021 0919   HCO3 29.8 (H) 01/04/2021 0919   TCO2 31 01/04/2021 0919   O2SAT 96.0 01/04/2021 0919    Coagulation Profile: No  results for input(s): INR, PROTIME in the last 168 hours.  Cardiac Enzymes: No results for input(s): CKTOTAL, CKMB, CKMBINDEX, TROPONINI in the last 168 hours.  HbA1C: No results found for: HGBA1C  CBG: Recent Labs  Lab 01/05/21 2007 01/06/21 0754 01/06/21 1112 01/06/21 1523 01/06/21 2249  GLUCAP 127* 108* 134* 131* 92     Review of Systems:   Unable to obtain, intubated, chemically paralyzed  Past Medical History:  He,  has a past medical history of Cardiomyopathy (HCC) (06/2017), Chronic renal insufficiency, stage 3 (moderate) (HCC), Diastolic dysfunction (06/2017), Hypertension, Hypertensive urgency, and Obesity.   Surgical History:  History reviewed. No pertinent surgical history.   Social History:   reports that he has been smoking cigarettes. He has been smoking an average of 1 pack per day. He has never used smokeless tobacco. He reports current alcohol use of about 1.0 standard drink per week. He reports current drug use. Drug: Marijuana.   Family History:  His family history includes Diabetes in his father and mother.   Allergies Allergies  Allergen Reactions   Ace Inhibitors Anaphylaxis   Black Cherry Fruit Extract [Cherry Extract] Anaphylaxis    Tongue     Home Medications  Prior to Admission medications   Medication Sig Start Date End Date Taking? Authorizing Provider  ADVAIR HFA 115-21 MCG/ACT inhaler INHALE 2 PUFFS INTO THE LUNGS 2 (TWO) TIMES DAILY. 07/23/20   Hoy Register, MD  albuterol (VENTOLIN HFA) 108 (90 Base) MCG/ACT inhaler Inhale 2 puffs into the lungs every 6 (six) hours as needed for wheezing or shortness of breath (Shortness of breath). Patient not taking: Reported on 12/16/2020 04/18/19   Storm Frisk, MD  albuterol (VENTOLIN HFA) 108 (90 Base) MCG/ACT inhaler Inhale 2 puffs into the lungs every 4 (four) hours as needed for wheezing or shortness of breath. 10/17/20   Harris, Abigail, PA-C  amLODipine (NORVASC) 10 MG tablet TAKE 1 TABLET  (10 MG TOTAL) BY MOUTH DAILY. Patient taking differently: Take 10 mg by mouth daily. 11/06/19 12/16/20  Abelino Derrick, PA-C  amoxicillin (AMOXIL) 500 MG capsule Take 1 capsule (500 mg total) by mouth 3 (three) times daily. Patient not taking: No sig reported 06/26/20   Marcine Matar, MD  amoxicillin-clavulanate (AUGMENTIN) 500-125 MG tablet TAKE 1 TABLET (500 MG TOTAL) BY MOUTH 2 (TWO) TIMES DAILY WITH A MEAL. Patient not taking: No sig reported 07/14/20 07/14/21  Marcine Matar, MD  apixaban (ELIQUIS) 5 MG TABS tablet Take 1 tablet (5 mg total) by mouth 2 (two) times daily. 06/02/20 12/16/20  Fenton, Clint R, PA  atorvastatin (LIPITOR) 10 MG tablet TAKE 1 TABLET (10 MG TOTAL) BY MOUTH DAILY. Patient taking differently: Take 10 mg by mouth daily. 11/06/19 12/16/20  Abelino Derrick, PA-C  carvedilol (COREG) 3.125 MG tablet TAKE 1 TABLET (3.125 MG TOTAL) BY MOUTH 2 (TWO) TIMES DAILY WITH A MEAL. Patient taking differently: Take 3.125 mg by mouth 2 (two) times daily with a meal. 11/06/19 12/16/20  Kilroy, Eda Paschal, PA-C  fluticasone (FLONASE) 50 MCG/ACT nasal spray PLACE 2 SPRAYS INTO BOTH NOSTRILS DAILY. Patient taking differently: Place 2 sprays into both nostrils daily as needed for allergies. 06/26/20 06/26/21  Marcine Matar, MD  furosemide (LASIX) 40 MG tablet TAKE 1 TABLET (40 MG TOTAL) BY MOUTH DAILY. Patient taking differently: Take 40 mg by mouth daily. 11/06/19 12/16/20  Abelino Derrick, PA-C  hydrALAZINE (APRESOLINE) 50 MG tablet TAKE 1 TABLET (50 MG TOTAL) BY MOUTH 3 (THREE) TIMES DAILY. Patient taking differently: Take 50 mg by mouth 3 (three) times daily. 11/06/19 11/05/20  Abelino Derrick, PA-C  isosorbide dinitrate (ISORDIL) 20 MG tablet TAKE 1 TABLET (20 MG TOTAL) BY MOUTH 3 (THREE) TIMES DAILY. SCHEDULE OFFICE VISIT Patient taking differently: Take 20 mg  by mouth 3 (three) times daily. 10/17/20 10/17/21  Harris, Cammy CopaAbigail, PA-C  loratadine (CLARITIN) 10 MG tablet TAKE 1 TABLET (10 MG TOTAL) BY MOUTH  DAILY. Patient not taking: No sig reported 06/26/20 06/26/21  Marcine MatarJohnson, Deborah B, MD  methylPREDNISolone (MEDROL DOSEPAK) 4 MG TBPK tablet Use as directed Patient not taking: No sig reported 10/17/20   Arthor CaptainHarris, Abigail, PA-C  sodium chloride (OCEAN) 0.65 % SOLN nasal spray Place 2 sprays into both nostrils 3 (three) times daily. Patient taking differently: Place 2 sprays into both nostrils as needed for congestion. 04/18/19   Storm FriskWright, Patrick E, MD  Dolan AmenSteven Mahlia Fernando, MD IMTS, PGY-3 Pager: [phone] 01/07/2021,7:17 AM

## 2021-01-07 NOTE — Progress Notes (Signed)
Attending:    Subjective: Extubated yesterday Remains on 6L O2 [name] Restarted hydralazine yesterday   Objective: Vitals:   01/07/21 0600 01/07/21 0700 01/07/21 0800 01/07/21 0811  BP: (!) 152/92 (!) 157/95 (!) 146/90   Pulse: 74 81 93   Resp: 14 16 19    Temp:  98.1 F (36.7 C)    TempSrc:  Oral    SpO2: 95% 94% 95% 97%  Weight:      Height:       FiO2 (%):  [40 %] (P) 40 %  Intake/Output Summary (Last 24 hours) at 01/07/2021 1027 Last data filed at 01/07/2021 0800 Gross per 24 hour  Intake 933.98 ml  Output 4775 ml  Net -3841.02 ml    General:  Resting comfortably in bed HENT: NCAT OP clear PULM: CTA B, normal effort CV: RRR, no mgr GI: BS+, soft, nontender MSK: normal bulk and tone Neuro: awake, alert, no distress, MAEW    CBC    Component Value Date/Time   WBC 11.2 (H) 01/07/2021 0036   RBC 5.33 01/07/2021 0036   HGB 15.5 01/07/2021 0036   HGB 16.7 07/11/2020 1103   HCT 47.9 01/07/2021 0036   HCT 50.3 07/11/2020 1103   PLT 226 01/07/2021 0036   PLT 232 07/11/2020 1103   MCV 89.9 01/07/2021 0036   MCV 88 07/11/2020 1103   MCH 29.1 01/07/2021 0036   MCHC 32.4 01/07/2021 0036   RDW 15.3 01/07/2021 0036   RDW 13.6 07/11/2020 1103   LYMPHSABS 3.1 12/16/2020 0022   MONOABS 0.9 12/16/2020 0022   EOSABS 0.7 (H) 12/16/2020 0022   BASOSABS 0.1 12/16/2020 0022    BMET    Component Value Date/Time   NA 139 01/07/2021 0036   NA 140 10/12/2018 0842   K 4.1 01/07/2021 0036   CL 102 01/07/2021 0036   CO2 28 01/07/2021 0036   GLUCOSE 95 01/07/2021 0036   BUN 34 (H) 01/07/2021 0036   BUN 24 10/12/2018 0842   CREATININE 1.42 (H) 01/07/2021 0036   CALCIUM 8.5 (L) 01/07/2021 0036   GFRNONAA 56 (L) 01/07/2021 0036   GFRAA 56 (L) 08/02/2019 08/04/2019      Impression/Plan: Hypertension> add back amlodipine, continue hydralazine, holding coreg until we talk about cocaine abuse; would plan to restart coreg on 9/1 Angioedema due to food allergy largely resolved but  still has mild swelling today > counsel to avoid this, advance diet; continue steroid, pepcid today then stop tomorrow Acute respiratory failure with hypoxemia> give lasix again today, repeat CXR on 9/1 if oxygen not improved Systolic heart failure> LVEF 40% in 2019, will repeat here, diuresis Constipation> restart bowel regimen  Move to [facility] service  My cc time n/a minutes  [facility], MD [place] [facility] Pager: [phone] Cell: [phone] After 7pm: [phone]

## 2021-01-07 NOTE — Progress Notes (Signed)
Attempted to call report to [id number],unsuccessful at this time.  Was told that charge RN will call back when RN is available to receive report.

## 2021-01-07 NOTE — Evaluation (Signed)
Physical Therapy Evaluation Patient Details Name: Zachary Hawkins MRN: [id number] DOB: 17-Oct-1955 Today's Date: 01/07/2021   History of Present Illness  65 year old man admitted 01/04/21 due to acute hypoxic respiratory failure due to anaphylaxis requiring emergent intubation (intubated 8/28-8/30).  PMh includes Cardiomyopathy (06/2017), Chronic renal insufficiency (stage 3), Diastolic dysfunction, Hypertension, and Obesity.  Clinical Impression  Pt admitted with/for anaphalaxis, intubated and deconditioned on vent.  Pt needing min guard to min assist for basic mobility and gait.  Pt currently limited functionally due to the problems listed below.  (see problems list.)  Pt will benefit from PT to maximize function and safety to be able to get home safely with available assist.     Follow Up Recommendations No PT follow up    Equipment Recommendations  None recommended by PT    Recommendations for Other Services       Precautions / Restrictions Precautions Precautions: Fall Restrictions Weight Bearing Restrictions: No      Mobility  Bed Mobility Overal bed mobility: Needs Assistance Bed Mobility: Supine to Sit     Supine to sit: Supervision;HOB elevated     General bed mobility comments: use of rail to assist    Transfers Overall transfer level: Needs assistance Equipment used: None (did push IV pole) Transfers: Sit to/from Stand;Stand Pivot Transfers Sit to Stand: Min assist Stand pivot transfers: Min assist       General transfer comment: min A for balance and safety - good power up  Ambulation/Gait Ambulation/Gait assistance: Min assist Gait Distance (Feet): 110 Feet Assistive device: 1 person hand held assist Gait Pattern/deviations: Step-through pattern   Gait velocity interpretation: <1.8 ft/sec, indicate of risk for recurrent falls General Gait Details: mildly unsteady, some posterior list when not cued to stay forward.  Slow, short, wide BOS steps, pt  helped steady himself with use of the IV pole.  Stairs            Wheelchair Mobility    Modified Rankin (Stroke Patients Only)       Balance Overall balance assessment: Needs assistance Sitting-balance support: Single extremity supported;Feet supported Sitting balance-Leahy Scale: Good Sitting balance - Comments: sitting EOB Postural control: Posterior lean (in standing) Standing balance support: Single extremity supported Standing balance-Leahy Scale: Fair Standing balance comment: after initial standing Pt with posterior LOB requiring therapist intervention and returning to seated on bed                             Pertinent Vitals/Pain Pain Assessment: No/denies pain    Home Living Family/patient expects to be discharged to:: Private residence Living Arrangements: Alone Available Help at Discharge: Family Type of Home: Apartment Home Access: Level entry     Home Layout: One level Home Equipment: Cane - single point      Prior Function Level of Independence: Independent         Comments: enjoys spending time with his 16 kids and 2 great grandkids     Hand Dominance   Dominant Hand: Right    Extremity/Trunk Assessment   Upper Extremity Assessment Upper Extremity Assessment: Overall WFL for tasks assessed    Lower Extremity Assessment Lower Extremity Assessment: Overall WFL for tasks assessed (general proximal weakness)    Cervical / Trunk Assessment Cervical / Trunk Assessment: Normal Cervical / Trunk Exceptions: large body habitus  Communication   Communication: No difficulties  Cognition Arousal/Alertness: Awake/alert Behavior During Therapy: WFL for tasks assessed/performed  Overall Cognitive Status: Impaired/Different from baseline Area of Impairment: Safety/judgement;Awareness                         Safety/Judgement: Decreased awareness of safety;Decreased awareness of deficits Awareness: Emergent   General  Comments: LOB and decreased awareness of posterior lean      General Comments General comments (skin integrity, edema, etc.): initially Pt on 5L O2 when therapy team entered the room. Pt on RA throughout and dropped to 88% on RA. EHR never over 100 bpm. Left in the chair on 3L and saturations at 95%.    Exercises     Assessment/Plan    PT Assessment Patient needs continued PT services  PT Problem List Decreased strength;Decreased activity tolerance;Decreased balance;Decreased mobility;Cardiopulmonary status limiting activity       PT Treatment Interventions Gait training;Stair training;Functional mobility training;Therapeutic activities;Patient/family education    PT Goals (Current goals can be found in the Care Plan section)  Acute Rehab PT Goals Patient Stated Goal: to stop smoking PT Goal Formulation: With patient Time For Goal Achievement: 01/21/21 Potential to Achieve Goals: Good    Frequency Min 3X/week   Barriers to discharge        Co-evaluation PT/OT/SLP Co-Evaluation/Treatment: Yes Reason for Co-Treatment: For patient/therapist safety;To address functional/ADL transfers PT goals addressed during session: Mobility/safety with mobility OT goals addressed during session: ADL's and self-care       AM-PAC PT "6 Clicks" Mobility  Outcome Measure Help needed turning from your back to your side while in a flat bed without using bedrails?: A Little Help needed moving from lying on your back to sitting on the side of a flat bed without using bedrails?: A Little Help needed moving to and from a bed to a chair (including a wheelchair)?: A Little Help needed standing up from a chair using your arms (e.g., wheelchair or bedside chair)?: A Little Help needed to walk in hospital room?: A Little Help needed climbing 3-5 steps with a railing? : A Little 6 Click Score: 18    End of Session   Activity Tolerance: Patient tolerated treatment well Patient left: in chair;with  call bell/phone within reach Nurse Communication: Mobility status PT Visit Diagnosis: Unsteadiness on feet (R26.81);Other abnormalities of gait and mobility (R26.89);Difficulty in walking, not elsewhere classified (R26.2)    Time: 9604-5409 PT Time Calculation (min) (ACUTE ONLY): 30 min   Charges:   PT Evaluation $PT Eval Moderate Complexity: 1 Mod          01/07/2021  Jacinto Halim., PT Acute Rehabilitation Services [phone]  (pager) [phone]  (office)  Eliseo Gum Adlene Adduci 01/07/2021, 12:23 PM

## 2021-01-07 NOTE — Progress Notes (Signed)
  Speech Language Pathology Treatment: Dysphagia  Patient Details Name: Zachary Hawkins MRN: [id number] DOB: 21-Aug-1955 Today's Date: 01/07/2021 Time: 1217-1237 SLP Time Calculation (min) (ACUTE ONLY): 20 min  Assessment / Plan / Recommendation Clinical Impression  Pt was seen during lunch for dysphagia treatment. Pt, and nursing reported that the pt has been tolerating the current diet without overt s/sx of aspiration. Pt denied odynophagia with p.o. intake. He consumed a meal of spaghetti, chicken noodle soup, thin liquids, and trials of regular texture solids. Pt tolerated all solids and thin liquids (up to 6 oz taken consecutively) without symptoms of oropharyngeal dysphagia. A regular texture diet with thin liquids is recommended at this time, but pt stated, "I don't know if I'm ready for that yet" and requested that he remain on dysphagia 3 for now. Further skilled SLP services are not clinically indicated for swallowing and his diet may be subsequently advanced to regular textures per pt's wishes without need for SLP reassessment.    HPI HPI: 66 yo M PMH ACE-I angioedema, black cherry mediated anaphylaxis, Afib on eliquis, systolic and diastolic HF who presented to ED 01/04/21 with SOB, tongue swelling. Sx started approx 0400 01/04/21. Ate red cherries 8/27 (day prior to presentation), unclear if this mediated current presentation. ETT 8/28-8/30      SLP Plan  Discharge SLP treatment due to (comment);All goals met       Recommendations  Diet recommendations: Regular;Thin liquid Liquids provided via: Cup;Straw Medication Administration: Whole meds with puree                Oral Care Recommendations: Oral care BID Follow up Recommendations: None SLP Visit Diagnosis: Dysphagia, unspecified (R13.10) Plan: Discharge SLP treatment due to (comment);All goals met        I. Hardin Negus, [place], [place] Office number [phone] Pager  [place] 01/07/2021, 12:58 PM

## 2021-01-08 ENCOUNTER — Inpatient Hospital Stay (HOSPITAL_COMMUNITY): Payer: Medicaid Other

## 2021-01-08 ENCOUNTER — Telehealth: Payer: Self-pay

## 2021-01-08 DIAGNOSIS — I5021 Acute systolic (congestive) heart failure: Secondary | ICD-10-CM | POA: Diagnosis not present

## 2021-01-08 DIAGNOSIS — I5043 Acute on chronic combined systolic (congestive) and diastolic (congestive) heart failure: Secondary | ICD-10-CM

## 2021-01-08 LAB — CBC
HCT: 45.8 % (ref 39.0–52.0)
Hemoglobin: 14.6 g/dL (ref 13.0–17.0)
MCH: 29 pg (ref 26.0–34.0)
MCHC: 31.9 g/dL (ref 30.0–36.0)
MCV: 91.1 fL (ref 80.0–100.0)
Platelets: 239 10*3/uL (ref 150–400)
RBC: 5.03 MIL/uL (ref 4.22–5.81)
RDW: 15.1 % (ref 11.5–15.5)
WBC: 9.8 10*3/uL (ref 4.0–10.5)
nRBC: 0 % (ref 0.0–0.2)

## 2021-01-08 LAB — GLUCOSE, CAPILLARY
Glucose-Capillary: 128 mg/dL — ABNORMAL HIGH (ref 70–99)
Glucose-Capillary: 106 mg/dL — ABNORMAL HIGH (ref 70–99)
Glucose-Capillary: 120 mg/dL — ABNORMAL HIGH (ref 70–99)
Glucose-Capillary: 97 mg/dL (ref 70–99)

## 2021-01-08 LAB — ECHOCARDIOGRAM COMPLETE
Area-P 1/2: 4.39 cm2
Height: 73 in
S' Lateral: 3 cm
Weight: 4678.4 oz

## 2021-01-08 LAB — BASIC METABOLIC PANEL
Anion gap: 8 (ref 5–15)
BUN: 24 mg/dL — ABNORMAL HIGH (ref 8–23)
CO2: 30 mmol/L (ref 22–32)
Calcium: 8.6 mg/dL — ABNORMAL LOW (ref 8.9–10.3)
Chloride: 98 mmol/L (ref 98–111)
Creatinine, Ser: 1.31 mg/dL — ABNORMAL HIGH (ref 0.61–1.24)
GFR, Estimated: >60 mL/min (ref >60)
Glucose, Bld: 108 mg/dL — ABNORMAL HIGH (ref 70–99)
Potassium: 4.3 mmol/L (ref 3.5–5.1)
Sodium: 136 mmol/L (ref 135–145)

## 2021-01-08 MED ORDER — PERFLUTREN LIPID MICROSPHERE
1.0000 mL | INTRAVENOUS | Status: AC | PRN
  Administered 2021-01-08: 2 mL via INTRAVENOUS
  Filled 2021-01-08: qty 10

## 2021-01-08 MED ORDER — ISOSORBIDE DINITRATE 20 MG PO TABS
20.0000 mg | ORAL_TABLET | Freq: Three times a day (TID) | ORAL | Status: DC
  Administered 2021-01-08 – 2021-01-09 (×4): 20 mg via ORAL
  Filled 2021-01-08 (×6): qty 1

## 2021-01-08 MED ORDER — ATORVASTATIN CALCIUM 10 MG PO TABS
10.0000 mg | ORAL_TABLET | Freq: Every day | ORAL | Status: DC
  Administered 2021-01-08 – 2021-01-09 (×2): 10 mg via ORAL
  Filled 2021-01-08 (×2): qty 1

## 2021-01-08 MED ORDER — DIPHENHYDRAMINE HCL 25 MG PO CAPS: 25.0000 mg | ORAL_CAPSULE | Freq: Three times a day (TID) | ORAL | Status: DC | PRN

## 2021-01-08 MED ORDER — FUROSEMIDE 10 MG/ML IJ SOLN
40.0000 mg | Freq: Once | INTRAMUSCULAR | Status: AC
  Administered 2021-01-08: 40 mg via INTRAVENOUS
  Filled 2021-01-08: qty 4

## 2021-01-08 MED ORDER — LORATADINE 10 MG PO TABS
10.0000 mg | ORAL_TABLET | Freq: Every day | ORAL | Status: DC
  Administered 2021-01-08 – 2021-01-09 (×2): 10 mg via ORAL
  Filled 2021-01-08 (×2): qty 1

## 2021-01-08 NOTE — Progress Notes (Signed)
[facility] PROGRESS NOTE   Zachary Hawkins XTG:[id number] DOB: 1955/05/14 DOA: 01/04/2021  PCP: System, Provider Not In  Brief History/Interval Summary: 65 yo M PMH ACE-I angioedema, black cherry mediated anaphylaxis, Afib on eliquis, systolic and diastolic HF who presented to ED 01/04/21 with SOB, tongue swelling. Sx started approx 0400 01/04/21. Ate red cherries 8/27 (day prior to presentation), unclear if this mediated current presentation. Received epi, pepcid, benadryl solumedrol in ED. subsequently was intubated and admitted to ICU.  Extubated stabilized and then transferred to hospitalist service.  Consultants: Critical care medicine  Procedures: Transthoracic echocardiogram will be ordered  Antibiotics: Anti-infectives (From admission, onward)    None       Subjective/Interval History: Patient mentions that he is feeling better.  Still occasional shortness of breath.  Tongue swelling has improved.  Abilities to swallow was also improving.  Denies any chest pain.  Occasional dry cough.  No nausea or vomiting    Assessment/Plan:  Angioedema Successfully extubated on 8/31.  Tongue swelling has improved.  Remains on steroids and famotidine.  Also on Benadryl every 8 hours.  We will change this to as needed.  Place him on Claritin.  Acute respiratory failure with hypoxia/history of obstructive sleep apnea Noted to have hypoxia after extubation.  Requiring high flow nasal cannula.  Currently on 3 to 4 L.  Was given diuretics with some improvement.  Chest x-ray as 1 has not been done recently.  Wean down oxygen to maintain saturation greater than 90%.  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  We will repeat an echocardiogram here.  Carvedilol is on hold due to cocaine use.  Noted to be on amlodipine.   Allergy to ACE inhibitor. Noted to be on  hydralazine.  Consider restarting nitrates. Continue strict ins and outs and daily weights.  Essential hypertension Continue amlodipine.  Chronic kidney disease stage IIIa Renal function close to baseline.  Monitor urine output.  Chronic atrial fibrillation Noted to be on Eliquis.  Heart rate is well controlled.  Carvedilol has been on hold due to cocaine use.  Could consider diltiazem instead.  BPH Continue Flomax.  Bethanechol was discontinued.  Polysubstance abuse Urine drug screen positive for cocaine and marijuana.  He was counseled.  Abdominal distention Abdominal film did not show any acute findings.  Distention of the transverse colon was present and was noted to be stable.  Did have a bowel movement this morning.  Abdomen feels better to the patient.  Continue to monitor for now.  Continue MiraLAX.  Obesity Estimated body mass index is 38.58 kg/m as calculated from the following:   Height as of this encounter: 6\' 1"  (1.854 m).   Weight as of this encounter: 132.6 kg.   DVT Prophylaxis: On Eliquis Code Status: Full code Family Communication: Discussed with the patient Disposition Plan: Hopefully return home in 24 to 48 hours  Status is: Inpatient  Remains inpatient appropriate because:Ongoing diagnostic testing needed not appropriate for outpatient work up, IV treatments appropriate due to intensity of illness or inability to take PO, and Inpatient level of care appropriate due to severity of illness  Dispo: The patient is from: Home              Anticipated d/c is to: Home              Patient currently is not medically stable  to d/c.   Difficult to place patient No       Medications: Scheduled:  albuterol  2.5 mg Nebulization BID   amLODipine  10 mg Oral Daily   apixaban  5 mg Oral BID   Chlorhexidine Gluconate Cloth  6 each Topical Daily   diphenhydrAMINE  50 mg Oral Q8H   docusate sodium  100 mg Oral BID   famotidine  20 mg Oral BID   hydrALAZINE  50  mg Oral Q8H   mouth rinse  15 mL Mouth Rinse BID   multivitamin with minerals  1 tablet Oral Daily   polyethylene glycol  17 g Oral Daily   predniSONE  60 mg Oral Q breakfast   tamsulosin  0.4 mg Oral QPC supper   Continuous:  lactated ringers Stopped (01/04/21 1236)   UYQ:IHKVQQVZDGLOV, hydrALAZINE, labetalol, ondansetron (ZOFRAN) IV, polyethylene glycol   Objective:  Vital Signs  Vitals:   01/07/21 2226 01/08/21 0341 01/08/21 0536 01/08/21 0739  BP: (!) 169/90   (!) 154/96  Pulse: 80   94  Resp: 16   19  Temp: 97.9 F (36.6 C) 98.5 F (36.9 C)    TempSrc: Oral Oral    SpO2: 100%   91%  Weight: (!) 138.8 kg  132.6 kg   Height: 6\' 1"  (1.854 m)       Intake/Output Summary (Last 24 hours) at 01/08/2021 1012 Last data filed at 01/07/2021 1800 Gross per 24 hour  Intake --  Output 2200 ml  Net -2200 ml   Filed Weights   01/07/21 0411 01/07/21 2226 01/08/21 0536  Weight: (!) 138.2 kg (!) 138.8 kg 132.6 kg    General appearance: Awake alert.  In no distress Resp: Mildly tachypneic without use of accessory muscles.  Few crackles at the bases bilaterally.  No wheezing or rhonchi appreciated Cardio: S1-S2 is normal regular.  No S3-S4.  No rubs murmurs or bruit GI: Abdomen is soft.  Nontender nondistended.  Bowel sounds are present normal.  No masses organomegaly Extremities: No edema.  Full range of motion of lower extremities. Neurologic: Alert and oriented x3.  No focal neurological deficits.    Lab Results:  Data Reviewed: I have personally reviewed following labs and imaging studies  CBC: Recent Labs  Lab 01/04/21 0457 01/04/21 0919 01/05/21 0645 01/06/21 0628 01/07/21 0036 01/08/21 0018  WBC 7.5  --  12.1* 10.8* 11.2* 9.8  HGB 16.4 16.7 14.4 14.5 15.5 14.6  HCT 52.1* 49.0 45.4 44.2 47.9 45.8  MCV 92.0  --  89.4 88.8 89.9 91.1  PLT 209  --  238 225 226 239    Basic Metabolic Panel: Recent Labs  Lab 01/04/21 0457 01/04/21 0809 01/04/21 0919  01/05/21 0645 01/05/21 1535 01/06/21 0628 01/07/21 0036 01/08/21 0018  NA 139  --  141 138  --  140 139 136  K 4.3  --  4.5 4.2  --  3.8 4.1 4.3  CL 105  --   --  102  --  105 102 98  CO2 25  --   --  27  --  28 28 30   GLUCOSE 107*  --   --  126*  --  124* 95 108*  BUN 29*  --   --  31*  --  41* 34* 24*  CREATININE 1.44*  --   --  1.56*  --  1.50* 1.42* 1.31*  CALCIUM 9.3  --   --  8.7*  --  8.8* 8.5* 8.6*  MG  --  2.4  --  2.5* 2.4 2.7* 2.5*  --   PHOS  --   --   --  3.9 3.8 4.4  --   --     GFR: Estimated Creatinine Clearance: 82.5 mL/min (A) (by C-G formula based on SCr of 1.31 mg/dL (H)).    CBG: Recent Labs  Lab 01/07/21 0738 01/07/21 1119 01/07/21 1547 01/07/21 1950 01/08/21 0740  GLUCAP 102* 129* 148* 148* 128*     Recent Results (from the past 240 hour(s))  SARS CORONAVIRUS 2 (TAT 6-24 HRS) Nasopharyngeal Nasopharyngeal Swab     Status: None   Collection Time: 01/04/21  8:56 AM   Specimen: Nasopharyngeal Swab  Result Value Ref Range Status   SARS Coronavirus 2 NEGATIVE NEGATIVE Final    Comment: (NOTE) SARS-CoV-2 target nucleic acids are NOT DETECTED.  The SARS-CoV-2 RNA is generally detectable in upper and lower respiratory specimens during the acute phase of infection. Negative results do not preclude SARS-CoV-2 infection, do not rule out co-infections with other pathogens, and should not be used as the sole basis for treatment or other patient management decisions. Negative results must be combined with clinical observations, patient history, and epidemiological information. The expected result is Negative.  Fact Sheet for Patients: HairSlick.no  Fact Sheet for Healthcare Providers: quierodirigir.com  This test is not yet approved or cleared by the [place] FDA and  has been authorized for detection and/or diagnosis of SARS-CoV-2 by FDA under an Emergency Use Authorization (EUA). This EUA  will remain  in effect (meaning this test can be used) for the duration of the COVID-19 declaration under Se ction 564(b)(1) of the Act, 21 U.S.C. section 360bbb-3(b)(1), unless the authorization is terminated or revoked sooner.  Performed at [facility], [address], [place], [place] [address]   MRSA Next Gen by PCR, Nasal     Status: None   Collection Time: 01/04/21  9:24 PM   Specimen: Nasal Mucosa; Nasal Swab  Result Value Ref Range Status   MRSA by PCR Next Gen NOT DETECTED NOT DETECTED Final    Comment: (NOTE) The GeneXpert MRSA Assay (FDA approved for NASAL specimens only), is one component of a comprehensive MRSA colonization surveillance program. It is not intended to diagnose MRSA infection nor to guide or monitor treatment for MRSA infections. Test performance is not FDA approved in patients less than 15 years old. Performed at [facility], [address] [place], [place] [address]       Radiology Studies: DG Abd Portable 1V  Result Date: 01/06/2021 CLINICAL DATA:  Abdominal distension in a 65 year old male. EXAM: PORTABLE ABDOMEN - 1 VIEW COMPARISON:  Abdominal plain film from January 05, 2021. FINDINGS: Exam markedly limited by the patient's body habitus. Scattered gas-filled loops of bowel are present in the abdomen. The nasogastric tube that was present on the previous study is no longer visible. Moderate distension of the transverse colon is unchanged. There is some gas presumably in sigmoid and also likely in the rectum. EKG leads overlie the abdomen. On limited assessment no acute regional skeletal process. IMPRESSION: Moderate distension of the transverse colon, unchanged. Limited assessment excludes the upper abdomen. The nasogastric tube is no longer visible. Exam also markedly limited by the patient's body habitus. Electronically Signed   By: Donzetta Kohut M.D.   On: 01/06/2021 17:01       LOS: 4 days   Oliwia Berzins Rito Ehrlich  [facility] Pager on [email or web address]  01/08/2021, 10:12 AM

## 2021-01-08 NOTE — Progress Notes (Signed)
  Echocardiogram 2D Echocardiogram has been performed.  Augustine Radar 01/08/2021, 3:09 PM

## 2021-01-08 NOTE — Discharge Instructions (Addendum)

## 2021-01-08 NOTE — Telephone Encounter (Signed)
Referral notes received from [facility], Phone #: [phone], Fax #: [phone]   A copy of the notes have been placed in the scheduling box for check-out to pick-up and to enter referral. Original notes placed in file cabinet.

## 2021-01-08 NOTE — Progress Notes (Signed)
Physical Therapy Treatment Patient Details Name: Zachary Hawkins MRN: [id number] DOB: 09/09/1955 Today's Date: 01/08/2021    History of Present Illness 65 year old man admitted 01/04/21 due to acute hypoxic respiratory failure due to anaphylaxis requiring emergent intubation (intubated 8/28-8/30).  PMh includes Cardiomyopathy (06/2017), Chronic renal insufficiency (stage 3), Diastolic dysfunction, Hypertension, and Obesity.    PT Comments    Progressing well toward goals.  Emphasis on transitions, sit to stand, standing exercise warm up, progression of gait stability/stamina.    Follow Up Recommendations  No PT follow up     Equipment Recommendations  None recommended by PT    Recommendations for Other Services       Precautions / Restrictions Precautions Precautions: Fall Restrictions Weight Bearing Restrictions: No    Mobility  Bed Mobility Overal bed mobility: Needs Assistance Bed Mobility: Supine to Sit;Sit to Supine     Supine to sit: Min guard;HOB elevated Sit to supine: Min guard;HOB elevated        Transfers Overall transfer level: Needs assistance Equipment used: None Transfers: Sit to/from [employer] Sit to Stand: Min guard Stand pivot transfers: Min guard       General transfer comment: appropriate use of UE's  Ambulation/Gait Ambulation/Gait assistance: Min guard Gait Distance (Feet): 400 Feet Assistive device:  (pulling portable O2) Gait Pattern/deviations: Step-through pattern   Gait velocity interpretation: <1.8 ft/sec, indicate of risk for recurrent falls General Gait Details: mildly unsteady at times when scanning, focus off of task.  SpO2 on 4L at least 93-95% and HR up in the 100's,  RR 20's to 44 rpm   Stairs             Wheelchair Mobility    Modified Rankin (Stroke Patients Only)       Balance Overall balance assessment: Mild deficits observed, not formally tested   Sitting balance-Leahy Scale: Good        Standing balance-Leahy Scale: Fair                              Cognition Arousal/Alertness: Awake/alert Behavior During Therapy: WFL for tasks assessed/performed Overall Cognitive Status: Within Functional Limits for tasks assessed                                 General Comments:  (NT formally)      Exercises Other Exercises Other Exercises: squats at EOB with 1 UE assist on stationary surface.    General Comments        Pertinent Vitals/Pain Pain Assessment: Faces Faces Pain Scale: No hurt Pain Intervention(s): Monitored during session    Home Living                      Prior Function            PT Goals (current goals can now be found in the care plan section) Acute Rehab PT Goals Patient Stated Goal: to stop smoking PT Goal Formulation: With patient Time For Goal Achievement: 01/21/21 Potential to Achieve Goals: Good Progress towards PT goals: Progressing toward goals    Frequency    Min 3X/week      PT Plan Current plan remains appropriate    Co-evaluation              AM-PAC PT "6 Clicks" Mobility   Outcome Measure  Help needed  turning from your back to your side while in a flat bed without using bedrails?: A Little Help needed moving from lying on your back to sitting on the side of a flat bed without using bedrails?: A Little Help needed moving to and from a bed to a chair (including a wheelchair)?: A Little Help needed standing up from a chair using your arms (e.g., wheelchair or bedside chair)?: A Little Help needed to walk in hospital room?: A Little Help needed climbing 3-5 steps with a railing? : A Little 6 Click Score: 18    End of Session Equipment Utilized During Treatment: Oxygen Activity Tolerance: Patient tolerated treatment well;Patient limited by fatigue Patient left: in bed;with call bell/phone within reach Nurse Communication: Mobility status PT Visit Diagnosis: Unsteadiness on  feet (R26.81);Other abnormalities of gait and mobility (R26.89);Difficulty in walking, not elsewhere classified (R26.2)     Time: 7262-0355 PT Time Calculation (min) (ACUTE ONLY): 34 min  Charges:  $Gait Training: 8-22 mins $Therapeutic Activity: 8-22 mins                     01/08/2021  Jacinto Halim., PT Acute Rehabilitation Services [phone]  (pager) [phone]  (office)   Eliseo Gum Mita Vallo 01/08/2021, 11:43 AM

## 2021-01-09 LAB — BASIC METABOLIC PANEL
Anion gap: 7 (ref 5–15)
BUN: 20 mg/dL (ref 8–23)
CO2: 33 mmol/L — ABNORMAL HIGH (ref 22–32)
Calcium: 8.9 mg/dL (ref 8.9–10.3)
Chloride: 97 mmol/L — ABNORMAL LOW (ref 98–111)
Creatinine, Ser: 1.1 mg/dL (ref 0.61–1.24)
GFR, Estimated: >60 mL/min (ref >60)
Glucose, Bld: 96 mg/dL (ref 70–99)
Potassium: 3.9 mmol/L (ref 3.5–5.1)
Sodium: 137 mmol/L (ref 135–145)

## 2021-01-09 LAB — GLUCOSE, CAPILLARY: Glucose-Capillary: 94 mg/dL (ref 70–99)

## 2021-01-09 MED ORDER — LORATADINE 10 MG PO TABS: 10.0000 mg | ORAL_TABLET | Freq: Every day | ORAL | 0 refills | Status: DC

## 2021-01-09 MED ORDER — ADULT MULTIVITAMIN W/MINERALS CH
1.0000 | ORAL_TABLET | Freq: Every day | ORAL | Status: DC
Start: 2021-01-10 — End: 2021-10-07

## 2021-01-09 MED ORDER — POLYETHYLENE GLYCOL 3350 17 G PO PACK
17.0000 g | PACK | Freq: Every day | ORAL | 0 refills | Status: DC | PRN
Start: 2021-01-09 — End: 2021-05-27

## 2021-01-09 MED ORDER — FAMOTIDINE 20 MG PO TABS: 20.0000 mg | ORAL_TABLET | Freq: Two times a day (BID) | ORAL | 0 refills | Status: DC

## 2021-01-09 MED ORDER — APIXABAN 5 MG PO TABS: 5.0000 mg | ORAL_TABLET | Freq: Two times a day (BID) | ORAL | 2 refills | Status: DC

## 2021-01-09 MED ORDER — EPINEPHRINE 0.3 MG/0.3ML IJ SOAJ: 0.3000 mg | INTRAMUSCULAR | 1 refills | Status: AC | PRN

## 2021-01-09 MED ORDER — ALBUTEROL SULFATE (2.5 MG/3ML) 0.083% IN NEBU: 2.5000 mg | INHALATION_SOLUTION | Freq: Four times a day (QID) | RESPIRATORY_TRACT | Status: DC | PRN

## 2021-01-09 MED ORDER — PREDNISONE 20 MG PO TABS: ORAL_TABLET | ORAL | 0 refills | Status: DC

## 2021-01-09 MED ORDER — TAMSULOSIN HCL 0.4 MG PO CAPS: 0.4000 mg | ORAL_CAPSULE | Freq: Every day | ORAL | 0 refills | Status: DC

## 2021-01-09 NOTE — TOC Initial Note (Signed)
Transition of Care [facility]) - Initial/Assessment Note    Patient Details  Name: Zachary Hawkins MRN: [id number] Date of Birth: [birth year]  Transition of Care [facility]) CM/SW Contact:    Lorri Frederick, LCSW Phone Number: 01/09/2021, 9:51 AM  Clinical Narrative:   CSW spoke with pt regarding PCP.  Pt reports he is established with Ancil Boozer for PCP and his case manager, Bonita Quin, came to visit him yesterday and left a card: [phone].  Permission given to speak with sister, son, daughter.  Pt does not have home O2 currently, getting ready to do his walk test to see if he needs it.  Pt is vaccinated for covid with one booster.                  Expected Discharge Plan: Home/Self Care Barriers to Discharge: No Barriers Identified   Patient Goals and CMS Choice Patient states their goals for this hospitalization and ongoing recovery are:: stop smoking, better breathing CMS Medicare.gov Compare Post Acute Care list provided to::  (NA)    Expected Discharge Plan and Services Expected Discharge Plan: Home/Self Care       Living arrangements for the past 2 months: Single Family Home                                      Prior Living Arrangements/Services Living arrangements for the past 2 months: Single Family Home Lives with:: Self Patient language and need for interpreter reviewed:: Yes Do you feel safe going back to the place where you live?: Yes      Need for Family Participation in Patient Care: No (Comment) Care giver support system in place?: Yes (comment) Current home services: Other (comment) (none) Criminal Activity/Legal Involvement Pertinent to Current Situation/Hospitalization: No - Comment as needed  Activities of Daily Living      Permission Sought/Granted Permission sought to share information with : Family Supports Permission granted to share information with : Yes, Verbal Permission Granted  Share Information with NAME: sister Zachary Hawkins, son and daughter  (not listed)           Emotional Assessment Appearance:: Appears stated age Attitude/Demeanor/Rapport: Engaged Affect (typically observed): Appropriate, Pleasant Orientation: : Oriented to Self, Oriented to Place, Oriented to  Time, Oriented to Situation Alcohol / Substance Use: Not Applicable Psych Involvement: No (comment)  Admission diagnosis:  Angioedema [T78.3XXA] Angioedema, initial encounter [T78.3XXA] Patient Active Problem List   Diagnosis Date Noted   Angioedema 01/04/2021   Influenza vaccine refused 06/26/2020   COVID-19 vaccine series completed 06/26/2020   Paroxysmal atrial fibrillation (HCC) 07/02/2019   Secondary hypercoagulable state (HCC) 07/02/2019   OSA (obstructive sleep apnea) 04/03/2019   History of angioedema due to ACE INHIBITORS 10/13/2017   Non compliance w medication regimen 07/13/2017   Systolic and diastolic CHF, chronic (HCC) 06/23/2017   Uncontrolled hypertension 06/23/2017   Obesity with serious comorbidity    CKD (chronic kidney disease) stage 3, GFR 30-59 ml/min (HCC)    Dilated cardiomyopathy (HCC) 06/06/2017   Dyspnea 06/05/2017   PCP:  System, Provider Not In Pharmacy:   [facility] - [place], [place] - [address] [place] [address] Phone: [phone] Fax: [phone]     Social Determinants of Health (SDOH) Interventions    Readmission Risk Interventions No flowsheet data found.

## 2021-01-09 NOTE — Progress Notes (Signed)
   01/09/21 1223  AVS Discharge Documentation  AVS Discharge Instructions Including Medications Provided to patient/caregiver  Name of Person Receiving AVS Discharge Instructions Including Medications [facility]  Name of Clinician That Reviewed AVS Discharge Instructions Including Medications Luanna Salk RN

## 2021-01-09 NOTE — Progress Notes (Signed)
Patient ambulated 600 feet on room air, oxygen saturations above 95% throughout the walk.

## 2021-01-09 NOTE — Evaluation (Signed)
Patient is made aware of the importance of continuous cardiac monitoring. Refuses to allow staff to reapply leads.

## 2021-01-09 NOTE — Discharge Summary (Signed)
[facility]  Physician Discharge Summary   Patient ID: Zachary Hawkins MRN: [id number] DOB/AGE: 1955-09-18 65 y.o.  Admit date: 01/04/2021 Discharge date: 01/09/2021    PCP: System, Provider Not In  DISCHARGE DIAGNOSES:  Anaphylaxis with angioedema, improved Acute on chronic systolic and diastolic CHF Acute respiratory failure with hypoxia, resolved Essential hypertension Chronic kidney disease stage IIIa Chronic atrial fibrillation History of BPH  RECOMMENDATIONS FOR OUTPATIENT FOLLOW UP: Message sent to cardiology to arrange outpatient follow-up    Home Health: None Equipment/Devices: None  CODE STATUS: Full code  DISCHARGE CONDITION: fair  Diet recommendation: Heart healthy  INITIAL HISTORY: 65 yo M PMH ACE-I angioedema, black cherry mediated anaphylaxis, Afib on eliquis, systolic and diastolic HF who presented to ED 01/04/21 with SOB, tongue swelling. Sx started approx 0400 01/04/21. Ate red cherries 8/27 (day prior to presentation), unclear if this mediated current presentation. Received epi, pepcid, benadryl solumedrol in ED. subsequently was intubated and admitted to ICU.  Extubated stabilized and then transferred to hospitalist service.   Consultants: Critical care medicine   Procedures: Transthoracic echocardiogram   HOSPITAL COURSE:   Angioedema/anaphylaxis Patient had to be intubated to protect his airway.  Admitted to the ICU.  Successfully extubated on 8/31.  Tongue swelling has improved.  He will be discharged home on Pepcid and a steroid taper.  Claritin will also be prescribed.  He will be also discharged with prescription for EpiPen.   Acute respiratory failure with hypoxia/history of obstructive sleep apnea Noted to have hypoxia after extubation.  Requiring high flow nasal cannula.  Slowly weaned off of oxygen.  He was ambulated and did not experience a drop in saturations.  He does not need home oxygen at this time.  Chest x-ray shows  improvement.   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  Echocardiogram was done during this hospital stay.  Shows improvement in his ejection fraction to 60%.  Carvedilol was placed on hold due to cocaine use.  Resumed at discharge.  He was counseled about his cocaine use.  His other cardiac medications were also resumed.  He is noted to be allergic to ACE inhibitor.  He is on hydralazine and nitrates.  He was diuresed during this hospital stay.  He may resume his home dose of furosemide.  Essential hypertension Continue amlodipine.  Chronic kidney disease stage IIIa Renal function close to baseline.    Chronic atrial fibrillation May resume home medications including Eliquis   BPH Started on Flomax.  Bethanechol was discontinued.  Polysubstance abuse Urine drug screen positive for cocaine and marijuana.  He was counseled.   Abdominal distention Abdominal film did not show any acute findings.  Distention of the transverse colon was present and was noted to be stable.  He did have bowel movements during this hospital stay.  Continue MiraLAX.    Obesity Estimated body mass index is 38.58 kg/m as calculated from the following:   Height as of this encounter: 6\' 1"  (1.854 m).   Weight as of this encounter: 132.6 kg.    Patient is stable.  Feels better.  Has ambulated.  Okay for discharge home today.   PERTINENT LABS:  The results of significant diagnostics from this hospitalization (including imaging, microbiology, ancillary and laboratory) are listed below for reference.    Microbiology: Recent Results (from the past 240 hour(s))  SARS CORONAVIRUS 2 (TAT 6-24 HRS) Nasopharyngeal  Nasopharyngeal Swab     Status: None   Collection Time: 01/04/21  8:56 AM   Specimen: Nasopharyngeal Swab  Result Value Ref Range Status   SARS Coronavirus 2 NEGATIVE NEGATIVE  Final    Comment: (NOTE) SARS-CoV-2 target nucleic acids are NOT DETECTED.  The SARS-CoV-2 RNA is generally detectable in upper and lower respiratory specimens during the acute phase of infection. Negative results do not preclude SARS-CoV-2 infection, do not rule out co-infections with other pathogens, and should not be used as the sole basis for treatment or other patient management decisions. Negative results must be combined with clinical observations, patient history, and epidemiological information. The expected result is Negative.  Fact Sheet for Patients: HairSlick.no  Fact Sheet for Healthcare Providers: quierodirigir.com  This test is not yet approved or cleared by the [place] FDA and  has been authorized for detection and/or diagnosis of SARS-CoV-2 by FDA under an Emergency Use Authorization (EUA). This EUA will remain  in effect (meaning this test can be used) for the duration of the COVID-19 declaration under Se ction 564(b)(1) of the Act, 21 U.S.C. section 360bbb-3(b)(1), unless the authorization is terminated or revoked sooner.  Performed at [facility], [address] [place], [place] [address]   MRSA Next Gen by PCR, Nasal     Status: None   Collection Time: 01/04/21  9:24 PM   Specimen: Nasal Mucosa; Nasal Swab  Result Value Ref Range Status   MRSA by PCR Next Gen NOT DETECTED NOT DETECTED Final    Comment: (NOTE) The GeneXpert MRSA Assay (FDA approved for NASAL specimens only), is one component of a comprehensive MRSA colonization surveillance program. It is not intended to diagnose MRSA infection nor to guide or monitor treatment for MRSA infections. Test performance is not FDA approved in patients less than 36 years old. Performed at [facility], [address] [place], [place] [address]      Labs:  COVID-19 Labs   Lab Results  Component Value Date   SARSCOV2NAA NEGATIVE  01/04/2021   SARSCOV2NAA NEGATIVE 06/28/2019      Basic Metabolic Panel: Recent Labs  Lab 01/04/21 0809 01/04/21 0919 01/05/21 0645 01/05/21 1535 01/06/21 0628 01/07/21 0036 01/08/21 0018 01/09/21 0416  NA  --    < > 138  --  140 139 136 137  K  --    < > 4.2  --  3.8 4.1 4.3 3.9  CL  --   --  102  --  105 102 98 97*  CO2  --   --  27  --  28 28 30  33*  GLUCOSE  --   --  126*  --  124* 95 108* 96  BUN  --   --  31*  --  41* 34* 24* 20  CREATININE  --   --  1.56*  --  1.50* 1.42* 1.31* 1.10  CALCIUM  --   --  8.7*  --  8.8* 8.5* 8.6* 8.9  MG 2.4  --  2.5* 2.4 2.7* 2.5*  --   --   PHOS  --   --  3.9 3.8 4.4  --   --   --    < > = values in this interval not displayed.    CBC: Recent Labs  Lab 01/04/21 0457 01/04/21 0919 01/05/21 0645 01/06/21 0628 01/07/21 0036 01/08/21 0018  WBC 7.5  --  12.1* 10.8* 11.2* 9.8  HGB 16.4 16.7 14.4 14.5 15.5  14.6  HCT 52.1* 49.0 45.4 44.2 47.9 45.8  MCV 92.0  --  89.4 88.8 89.9 91.1  PLT 209  --  238 225 226 239    BNP: BNP (last 3 results) Recent Labs    10/17/20 0906 12/16/20 0022  BNP 17.1 29.6      CBG: Recent Labs  Lab 01/08/21 0740 01/08/21 1125 01/08/21 1620 01/08/21 2209 01/09/21 0718  GLUCAP 128* 106* 120* 97 94     IMAGING STUDIES DG Chest 2 View  Result Date: 01/08/2021 CLINICAL DATA:  Dyspnea EXAM: CHEST - 2 VIEW COMPARISON:  Chest radiograph 01/04/2021 FINDINGS: The mediastinum is prominent, similar to the prior study and likely exaggerated by AP technique. The heart is enlarged, also unchanged. Linear opacities at the lung bases likely reflect subsegmental atelectasis. There is no focal consolidation. There is no overt pulmonary edema. There is no pleural effusion or pneumothorax. There is no acute osseous abnormality. IMPRESSION: Unchanged cardiomegaly. Otherwise, no radiographic evidence of acute cardiopulmonary process. No significant interval change. Electronically Signed   By: Lesia Hausen M.D.    On: 01/08/2021 14:53   DG Chest 2 View  Result Date: 01/04/2021 CLINICAL DATA:  65 year old male with shortness of breath. Tongue swelling. EXAM: CHEST - 2 VIEW COMPARISON:  Chest radiographs 12/16/2020 and earlier. FINDINGS: Motion artifact on today's lateral view. Lower lung volumes. Mediastinal contours are stable, no cardiomegaly. Visualized tracheal air column is within normal limits. No pneumothorax, pulmonary edema, pleural effusion or confluent pulmonary opacity. No acute osseous abnormality identified. Bulky midthoracic endplate osteophytes. Negative visible bowel gas pattern. IMPRESSION: Lower lung volumes.  No acute cardiopulmonary abnormality. Electronically Signed   By: Odessa Fleming M.D.   On: 01/04/2021 05:59   DG Chest 2 View  Result Date: 12/16/2020 CLINICAL DATA:  Shortness of breath, weight gain recently, bilateral lower extremity edema, history of CHF EXAM: CHEST - 2 VIEW COMPARISON:  Radiograph 05/19/2020 FINDINGS: Some slightly increasing hazy interstitial opacities are present throughout the lungs with few peripheral septal lines and fissural thickening. Pulmonary vascularity is cephalized and mildly indistinct. Enlarged cardiac silhouette is similar to comparison priors with a calcified aorta. No pneumothorax. No layering pleural effusion. No acute osseous or soft tissue abnormality. IMPRESSION: Features may reflect some mild interstitial edema on a background of chronic coarsened interstitial changes with additional features compatible with CHF in the appropriate clinical setting. Electronically Signed   By: Kreg Shropshire M.D.   On: 12/16/2020 01:03   CT Soft Tissue Neck W Contrast  Addendum Date: 01/04/2021   ADDENDUM REPORT: 01/04/2021 20:08 ADDENDUM: Diffusely heterogeneous and mildly enlarged thyroid gland. Recommend thyroid ultrasound (ref: J Am Coll Radiol. 2015 Feb;12(2): 143-50). Electronically Signed   By: Sebastian Ache M.D.   On: 01/04/2021 20:08   Result Date:  01/04/2021 CLINICAL DATA:  Neck abscess, deep tissue. Neck/facial swelling with difficulty speaking. Possible urgent reaction. EXAM: CT NECK WITH CONTRAST TECHNIQUE: Multidetector CT imaging of the neck was performed using the standard protocol following the bolus administration of intravenous contrast. CONTRAST:  67mL OMNIPAQUE IOHEXOL 350 MG/ML SOLN COMPARISON:  None. FINDINGS: Pharynx and larynx: Assessment is limited by the presence of endotracheal and enteric tubes with the latter being looped in the oral cavity before coursing inferiorly into the esophagus with tip not included on this study. Fluid/secretions are present in the nasopharynx and oropharynx. The or pharyngeal soft tissues are mildly prominent diffusely in collapsed around the support tubes without a definite focal abnormality identified. There is no  peritonsillar or retropharyngeal fluid collection. No marked laryngeal edema is evident within study limitations. Salivary glands: No inflammation, mass, or stone. Thyroid: Diffusely heterogeneous and mildly enlarged thyroid gland. Lymph nodes: Mildly prominent level II lymph nodes bilaterally measuring up to 1 cm in short axis, likely reactive. Vascular: Major vascular structures of the neck are grossly patent. Mild carotid atherosclerosis. Limited intracranial: Unremarkable. Visualized orbits: Unremarkable. Mastoids and visualized paranasal sinuses: Complete opacification of the included ethmoid air cells and near complete opacification of the right maxillary sinus. Subtotal opacification of the sphenoid sinuses by mucosal thickening and fluid. Complete opacification of the nasal cavity bilaterally. Clear mastoid air cells and tympanic cavities. Skeleton: No suspicious osseous lesion or acute osseous abnormality. Moderately advanced right facet arthrosis at C3-4. Upper chest: Clear lung apices. Mild calcified plaque in the aortic arch. Other: None. IMPRESSION: 1. Limited assessment of the pharynx  and larynx due to the presence of endotracheal and enteric tubes. No abscess. 2. Sinusitis with extensive opacification of the nasal cavity and paranasal sinuses. 3. Aortic Atherosclerosis (ICD10-I70.0). Electronically Signed: By: Sebastian Ache M.D. On: 01/04/2021 13:29   DG Chest Portable 1 View  Result Date: 01/04/2021 CLINICAL DATA:  Endotracheal tube placement. EXAM: PORTABLE CHEST 1 VIEW COMPARISON:  January 04, 2021. FINDINGS: Stable cardiomediastinal silhouette. Endotracheal and nasogastric tubes appear to be in grossly good position. Both lungs are clear. The visualized skeletal structures are unremarkable. IMPRESSION: Endotracheal and nasogastric tubes appear to be in good position. No acute cardiopulmonary abnormality seen. Electronically Signed   By: Lupita Raider M.D.   On: 01/04/2021 08:35   DG Abd Portable 1V  Result Date: 01/06/2021 CLINICAL DATA:  Abdominal distension in a 65 year old male. EXAM: PORTABLE ABDOMEN - 1 VIEW COMPARISON:  Abdominal plain film from January 05, 2021. FINDINGS: Exam markedly limited by the patient's body habitus. Scattered gas-filled loops of bowel are present in the abdomen. The nasogastric tube that was present on the previous study is no longer visible. Moderate distension of the transverse colon is unchanged. There is some gas presumably in sigmoid and also likely in the rectum. EKG leads overlie the abdomen. On limited assessment no acute regional skeletal process. IMPRESSION: Moderate distension of the transverse colon, unchanged. Limited assessment excludes the upper abdomen. The nasogastric tube is no longer visible. Exam also markedly limited by the patient's body habitus. Electronically Signed   By: Donzetta Kohut M.D.   On: 01/06/2021 17:01   DG Abd Portable 1V  Result Date: 01/05/2021 CLINICAL DATA:  Status post orogastric tube placement. EXAM: PORTABLE ABDOMEN - 1 VIEW COMPARISON:  January 04, 2021. FINDINGS: The bowel gas pattern is normal. Distal  tip of nasogastric tube is seen in the stomach. No radio-opaque calculi or other significant radiographic abnormality are seen. IMPRESSION: Distal tip of nasogastric tube seen in the stomach. Electronically Signed   By: Lupita Raider M.D.   On: 01/05/2021 13:17   DG Abd Portable 1V  Result Date: 01/04/2021 CLINICAL DATA:  Evaluate enteric tube. EXAM: PORTABLE ABDOMEN - 1 VIEW COMPARISON:  January 04, 2021 FINDINGS: Evaluation is limited secondary to underpenetration. Nonvisualization of the pelvis. Enteric tube tip and side port project over the stomach. No dilated loops of bowel are seen. Lung bases are better evaluated on same day chest radiograph. IMPRESSION: Enteric tube tip and side port project over the stomach. Electronically Signed   By: Meda Klinefelter M.D.   On: 01/04/2021 16:31   ECHOCARDIOGRAM COMPLETE  Result Date: 01/08/2021  ECHOCARDIOGRAM REPORT   Patient Name:   LASHUN MCCANTS Date of Exam: 01/08/2021 Medical Rec #:  [id number]       Height:       73.0 in Accession #:    [id number]      Weight:       292.4 lb Date of Birth:  09/03/1955       BSA:          2.529 m Patient Age:    65 years        BP:           154/96 mmHg Patient Gender: M               HR:           94 bpm. Exam Location:  Inpatient Procedure: 2D Echo, Cardiac Doppler, Color Doppler and Intracardiac            Opacification Agent Indications:    CHF-Acute Systolic I50.21  History:        Patient has prior history of Echocardiogram examinations, most                 recent 06/06/2017. Cardiomyopathy; Risk Factors:Hypertension.  Sonographer:    Eulah Pont RDCS Referring Phys: [id number] [facility]  Sonographer Comments: Patient is morbidly obese. IMPRESSIONS  1. Left ventricular ejection fraction, by estimation, is 60 to 65%. The left ventricle has normal function. The left ventricle has no regional wall motion abnormalities. There is mild left ventricular hypertrophy. Left ventricular diastolic parameters are consistent  with Grade I diastolic dysfunction (impaired relaxation).  2. Right ventricular systolic function is normal. The right ventricular size is normal.  3. The mitral valve is normal in structure. No evidence of mitral valve regurgitation. No evidence of mitral stenosis.  4. The aortic valve is normal in structure. Aortic valve regurgitation is not visualized. No aortic stenosis is present.  5. Aortic dilatation noted. There is mild dilatation of the aortic root, measuring 40 mm. There is borderline dilatation of the ascending aorta, measuring 39 mm.  6. The inferior vena cava is normal in size with greater than 50% respiratory variability, suggesting right atrial pressure of 3 mmHg. FINDINGS  Left Ventricle: Left ventricular ejection fraction, by estimation, is 60 to 65%. The left ventricle has normal function. The left ventricle has no regional wall motion abnormalities. Definity contrast agent was given IV to delineate the left ventricular  endocardial borders. The left ventricular internal cavity size was normal in size. There is mild left ventricular hypertrophy. Left ventricular diastolic parameters are consistent with Grade I diastolic dysfunction (impaired relaxation). Right Ventricle: The right ventricular size is normal. No increase in right ventricular wall thickness. Right ventricular systolic function is normal. Left Atrium: Left atrial size was normal in size. Right Atrium: Right atrial size was normal in size. Pericardium: There is no evidence of pericardial effusion. Mitral Valve: The mitral valve is normal in structure. No evidence of mitral valve regurgitation. No evidence of mitral valve stenosis. Tricuspid Valve: The tricuspid valve is normal in structure. Tricuspid valve regurgitation is not demonstrated. No evidence of tricuspid stenosis. Aortic Valve: The aortic valve is normal in structure. Aortic valve regurgitation is not visualized. No aortic stenosis is present. Pulmonic Valve: The pulmonic  valve was normal in structure. Pulmonic valve regurgitation is not visualized. No evidence of pulmonic stenosis. Aorta: Aortic dilatation noted. There is mild dilatation of the aortic root, measuring 40 mm. There is borderline dilatation of the  ascending aorta, measuring 39 mm. Venous: The inferior vena cava is normal in size with greater than 50% respiratory variability, suggesting right atrial pressure of 3 mmHg. IAS/Shunts: No atrial level shunt detected by color flow Doppler.  LEFT VENTRICLE PLAX 2D LVIDd:         4.90 cm  Diastology LVIDs:         3.00 cm  LV e' medial:    8.25 cm/s LV PW:         1.20 cm  LV E/e' medial:  7.1 LV IVS:        1.20 cm  LV e' lateral:   8.78 cm/s LVOT diam:     2.20 cm  LV E/e' lateral: 6.7 LV SV:         108 LV SV Index:   43 LVOT Area:     3.80 cm  RIGHT VENTRICLE RV S prime:     11.30 cm/s TAPSE (M-mode): 2.2 cm LEFT ATRIUM             Index       RIGHT ATRIUM           Index LA diam:        3.20 cm 1.27 cm/m  RA Area:     12.20 cm LA Vol (A2C):   35.1 ml 13.88 ml/m RA Volume:   27.80 ml  10.99 ml/m LA Vol (A4C):   42.9 ml 16.97 ml/m LA Biplane Vol: 41.6 ml 16.45 ml/m  AORTIC VALVE LVOT Vmax:   135.00 cm/s LVOT Vmean:  82.300 cm/s LVOT VTI:    0.283 m  AORTA Ao Root diam: 4.00 cm Ao Asc diam:  3.90 cm MITRAL VALVE MV Area (PHT): 4.39 cm    SHUNTS MV Decel Time: 173 msec    Systemic VTI:  0.28 m MV E velocity: 58.70 cm/s  Systemic Diam: 2.20 cm MV A velocity: 84.00 cm/s MV E/A ratio:  0.70 Donato Schultz MD Electronically signed by Donato Schultz MD Signature Date/Time: 01/08/2021/3:36:47 PM    Final     DISCHARGE EXAMINATION: Vitals:   01/09/21 0022 01/09/21 0716 01/09/21 0756 01/09/21 1121  BP: (!) 163/90  (!) 154/78 (!) 129/108  Pulse: 86  98 98  Resp: Temp: 98.2 F (36.8 C)  98.2 F (36.8 C) 98.4 F (36.9 C)  TempSrc: Oral  Oral Oral  SpO2: 99% 99% 98% 98%  Weight:      Height:       General appearance: Awake alert.  In no distress Resp: Clear to  auscultation bilaterally.  Normal effort Cardio: S1-S2 is normal regular.  No S3-S4.  No rubs murmurs or bruit GI: Abdomen is soft.  Nontender nondistended.  Bowel sounds are present normal.  No masses organomegaly Extremities: No edema.  Full range of motion of lower extremities. Neurologic: Alert and oriented x3.  No focal neurological deficits.    DISPOSITION: Home  Discharge Instructions     (HEART FAILURE PATIENTS) Call MD:  Anytime you have any of the following symptoms: 1) 3 pound weight gain in 24 hours or 5 pounds in 1 week 2) shortness of breath, with or without a dry hacking cough 3) swelling in the hands, feet or stomach 4) if you have to sleep on extra pillows at night in order to breathe.   Complete by: As directed    Call MD for:  difficulty breathing, headache or visual disturbances   Complete by: As directed  Call MD for:  extreme fatigue   Complete by: As directed    Call MD for:  hives   Complete by: As directed    Call MD for:  persistant dizziness or light-headedness   Complete by: As directed    Call MD for:  persistant nausea and vomiting   Complete by: As directed    Call MD for:  severe uncontrolled pain   Complete by: As directed    Call MD for:  temperature >100.4   Complete by: As directed    Discharge instructions   Complete by: As directed    Please take your medications as prescribed.  I will send a message to the cardiology office to arrange a follow-up appointment for you.  Eat a soft diet for the next 2 to 3 days and then you may resume your usual diet as long as you do not have any difficulty swallowing  You were cared for by a hospitalist during your hospital stay. If you have any questions about your discharge medications or the care you received while you were in the hospital after you are discharged, you can call the unit and asked to speak with the hospitalist on call if the hospitalist that took care of you is not available. Once you are  discharged, your primary care physician will handle any further medical issues. Please note that NO REFILLS for any discharge medications will be authorized once you are discharged, as it is imperative that you return to your primary care physician (or establish a relationship with a primary care physician if you do not have one) for your aftercare needs so that they can reassess your need for medications and monitor your lab values. If you do not have a primary care physician, you can call [phone] for a physician referral.   Increase activity slowly   Complete by: As directed           Allergies as of 01/09/2021       Reactions   Ace Inhibitors Anaphylaxis   Black Cherry Fruit Extract [name]] Anaphylaxis   Tongue   Grenadine Flavor [flavoring Agent] Anaphylaxis        Medication List     TAKE these medications    Advair HFA 115-21 MCG/ACT inhaler Generic drug: fluticasone-salmeterol INHALE 2 PUFFS INTO THE LUNGS 2 (TWO) TIMES DAILY.   albuterol 108 (90 Base) MCG/ACT inhaler Commonly known as: VENTOLIN HFA Inhale 2 puffs into the lungs every 4 (four) hours as needed for wheezing or shortness of breath.   amLODipine 10 MG tablet Commonly known as: NORVASC TAKE 1 TABLET (10 MG TOTAL) BY MOUTH DAILY. What changed: how much to take   apixaban 5 MG Tabs tablet Commonly known as: ELIQUIS Take 1 tablet (5 mg total) by mouth 2 (two) times daily.   atorvastatin 10 MG tablet Commonly known as: LIPITOR TAKE 1 TABLET (10 MG TOTAL) BY MOUTH DAILY. What changed: how much to take   carvedilol 3.125 MG tablet Commonly known as: COREG TAKE 1 TABLET (3.125 MG TOTAL) BY MOUTH 2 (TWO) TIMES DAILY WITH A MEAL. What changed: how much to take   EPINEPHrine 0.3 mg/0.3 mL Soaj injection Commonly known as: EPI-PEN Inject 0.3 mg into the muscle as needed for anaphylaxis.   famotidine 20 MG tablet Commonly known as: PEPCID Take 1 tablet (20 mg total) by mouth 2 (two) times  daily for 14 days.   fluticasone 50 MCG/ACT nasal spray Commonly known as: FLONASE PLACE  2 SPRAYS INTO BOTH NOSTRILS DAILY. What changed:  when to take this reasons to take this   furosemide 40 MG tablet Commonly known as: LASIX TAKE 1 TABLET (40 MG TOTAL) BY MOUTH DAILY. What changed: how much to take   hydrALAZINE 50 MG tablet Commonly known as: APRESOLINE TAKE 1 TABLET (50 MG TOTAL) BY MOUTH 3 (THREE) TIMES DAILY. What changed: how much to take   isosorbide dinitrate 20 MG tablet Commonly known as: ISORDIL TAKE 1 TABLET (20 MG TOTAL) BY MOUTH 3 (THREE) TIMES DAILY. SCHEDULE OFFICE VISIT What changed:  how much to take how to take this when to take this   loratadine 10 MG tablet Commonly known as: CLARITIN Take 1 tablet (10 mg total) by mouth daily. What changed: how much to take   multivitamin with minerals Tabs tablet Take 1 tablet by mouth daily.   polyethylene glycol 17 g packet Commonly known as: MIRALAX / GLYCOLAX Take 17 g by mouth daily as needed for moderate constipation.   predniSONE 20 MG tablet Commonly known as: DELTASONE Take 3 tablets once daily for 3 days followed by 2 tablets once daily for 3 days followed by 1 tablet once daily for 3 days and then stop   sodium chloride 0.65 % Soln nasal spray Commonly known as: OCEAN Place 2 sprays into both nostrils 3 (three) times daily. What changed:  when to take this reasons to take this   tamsulosin 0.4 MG Caps capsule Commonly known as: FLOMAX Take 1 capsule (0.4 mg total) by mouth daily after supper.          Follow-up Information     Croitoru, Mihai, MD Follow up.   Specialty: Cardiology Why: his office will call with appointment Contact information: [address] [place] [address] [phone]                 TOTAL DISCHARGE TIME: 35 minutes  Leslie Jester Rito EhrlichKrishnan  [facility] Pager on [email or web address]  01/10/2021, 3:26 PM

## 2021-01-13 ENCOUNTER — Emergency Department (HOSPITAL_COMMUNITY)
Admission: EM | Admit: 2021-01-13 | Discharge: 2021-01-13 | Disposition: A | Discharge status: Home / Self Care | Payer: Medicaid Other | Attending: Emergency Medicine | Admitting: Emergency Medicine

## 2021-01-13 ENCOUNTER — Emergency Department (HOSPITAL_COMMUNITY): Payer: Medicaid Other

## 2021-01-13 ENCOUNTER — Other Ambulatory Visit: Payer: Self-pay

## 2021-01-13 DIAGNOSIS — I130 Hypertensive heart and chronic kidney disease with heart failure and stage 1 through stage 4 chronic kidney disease, or unspecified chronic kidney disease: Secondary | ICD-10-CM | POA: Diagnosis not present

## 2021-01-13 DIAGNOSIS — R0602 Shortness of breath: Secondary | ICD-10-CM | POA: Diagnosis present

## 2021-01-13 DIAGNOSIS — Z7952 Long term (current) use of systemic steroids: Secondary | ICD-10-CM | POA: Diagnosis not present

## 2021-01-13 DIAGNOSIS — I5042 Chronic combined systolic (congestive) and diastolic (congestive) heart failure: Secondary | ICD-10-CM | POA: Diagnosis not present

## 2021-01-13 DIAGNOSIS — N183 Chronic kidney disease, stage 3 unspecified: Secondary | ICD-10-CM | POA: Diagnosis not present

## 2021-01-13 DIAGNOSIS — Z7901 Long term (current) use of anticoagulants: Secondary | ICD-10-CM | POA: Diagnosis not present

## 2021-01-13 DIAGNOSIS — F17210 Nicotine dependence, cigarettes, uncomplicated: Secondary | ICD-10-CM | POA: Insufficient documentation

## 2021-01-13 DIAGNOSIS — J441 Chronic obstructive pulmonary disease with (acute) exacerbation: Secondary | ICD-10-CM | POA: Diagnosis not present

## 2021-01-13 DIAGNOSIS — Z79899 Other long term (current) drug therapy: Secondary | ICD-10-CM | POA: Insufficient documentation

## 2021-01-13 LAB — CBC WITH DIFFERENTIAL/PLATELET
Abs Immature Granulocytes: 0.28 10*3/uL — ABNORMAL HIGH (ref 0.00–0.07)
Basophils Absolute: 0.1 10*3/uL (ref 0.0–0.1)
Basophils Relative: 1 %
Eosinophils Absolute: 0.2 10*3/uL (ref 0.0–0.5)
Eosinophils Relative: 2 %
HCT: 46.5 % (ref 39.0–52.0)
Hemoglobin: 14.2 g/dL (ref 13.0–17.0)
Immature Granulocytes: 3 %
Lymphocytes Relative: 25 %
Lymphs Abs: 2.5 10*3/uL (ref 0.7–4.0)
MCH: 28.5 pg (ref 26.0–34.0)
MCHC: 30.5 g/dL (ref 30.0–36.0)
MCV: 93.4 fL (ref 80.0–100.0)
Monocytes Absolute: 0.9 10*3/uL (ref 0.1–1.0)
Monocytes Relative: 9 %
Neutro Abs: 6.1 10*3/uL (ref 1.7–7.7)
Neutrophils Relative %: 60 %
Platelets: 304 10*3/uL (ref 150–400)
RBC: 4.98 MIL/uL (ref 4.22–5.81)
RDW: 14.7 % (ref 11.5–15.5)
WBC: 10.1 10*3/uL (ref 4.0–10.5)
nRBC: 0 % (ref 0.0–0.2)

## 2021-01-13 LAB — BASIC METABOLIC PANEL
Anion gap: 7 (ref 5–15)
BUN: 18 mg/dL (ref 8–23)
CO2: 30 mmol/L (ref 22–32)
Calcium: 8.8 mg/dL — ABNORMAL LOW (ref 8.9–10.3)
Chloride: 101 mmol/L (ref 98–111)
Creatinine, Ser: 1.12 mg/dL (ref 0.61–1.24)
GFR, Estimated: >60 mL/min (ref >60)
Glucose, Bld: 125 mg/dL — ABNORMAL HIGH (ref 70–99)
Potassium: 3.8 mmol/L (ref 3.5–5.1)
Sodium: 138 mmol/L (ref 135–145)

## 2021-01-13 LAB — TROPONIN I (HIGH SENSITIVITY)
Troponin I (High Sensitivity): 18 ng/L — ABNORMAL HIGH (ref <18)
Troponin I (High Sensitivity): 16 ng/L (ref <18)

## 2021-01-13 MED ORDER — ALBUTEROL (5 MG/ML) CONTINUOUS INHALATION SOLN
10.0000 mg/h | INHALATION_SOLUTION | Freq: Once | RESPIRATORY_TRACT | Status: AC
  Administered 2021-01-13: 10 mg/h via RESPIRATORY_TRACT
  Filled 2021-01-13: qty 20

## 2021-01-13 MED ORDER — ALBUTEROL SULFATE HFA 108 (90 BASE) MCG/ACT IN AERS: 2.0000 | INHALATION_SPRAY | RESPIRATORY_TRACT | Status: DC | PRN

## 2021-01-13 MED ORDER — GUAIFENESIN-DM 100-10 MG/5ML PO SYRP: 5.0000 mL | ORAL_SOLUTION | ORAL | Status: DC | PRN

## 2021-01-13 MED ORDER — SODIUM CHLORIDE 0.9 % IV BOLUS: 500.0000 mL | Freq: Once | INTRAVENOUS | Status: DC

## 2021-01-13 MED ORDER — ONDANSETRON HCL 4 MG/2ML IJ SOLN: 4.0000 mg | Freq: Once | INTRAMUSCULAR | Status: DC

## 2021-01-13 MED ORDER — PREDNISONE 20 MG PO TABS
60.0000 mg | ORAL_TABLET | Freq: Once | ORAL | Status: AC
  Administered 2021-01-13: 60 mg via ORAL
  Filled 2021-01-13: qty 3

## 2021-01-13 NOTE — ED Notes (Signed)
Pt discharged to home. Discharge instructions have been discussed with patient and/or family members. Pt verbally acknowledges understanding d/c instructions, and endorses comprehension to checkout at registration before leaving.  °

## 2021-01-13 NOTE — ED Triage Notes (Signed)
Pt c/o shortness of breath

## 2021-01-13 NOTE — ED Notes (Signed)
ED Provider at bedside. 

## 2021-01-13 NOTE — ED Provider Notes (Signed)
[facility] EMERGENCY DEPARTMENT Provider Note   CSN: [id number] Arrival date & time: 01/13/21  0554     History Chief Complaint  Patient presents with   Shortness of Breath    Zachary Hawkins is a 65 y.o. male.  HPI   He complains of ongoing sore throat and congestion which she is having difficulty mobilizing for 4 days since discharge from the hospital following episode of angioedema that was treated with medications and intubation.  He states he has stopped smoking "[place]'s."  He states he has successfully avoided using cocaine recently.  He denies fever, chills, nausea, vomiting, chest pain, weakness or dizziness.  He was discharged with prednisone prescription.  He is currently on a prednisone taper.  He is taking his prescribed medications.  There are no other known active modifying factors.   Past Medical History:  Diagnosis Date   Cardiomyopathy (HCC) 06/2017   EF 40-45%   Chronic renal insufficiency, stage 3 (moderate) (HCC)    Diastolic dysfunction 06/2017   grade 2    Hypertension    Hypertensive urgency    Obesity     Patient Active Problem List   Diagnosis Date Noted   Angioedema 01/04/2021   Influenza vaccine refused 06/26/2020   COVID-19 vaccine series completed 06/26/2020   Paroxysmal atrial fibrillation (HCC) 07/02/2019   Secondary hypercoagulable state (HCC) 07/02/2019   OSA (obstructive sleep apnea) 04/03/2019   History of angioedema due to ACE INHIBITORS 10/13/2017   Non compliance w medication regimen 07/13/2017   Systolic and diastolic CHF, chronic (HCC) 06/23/2017   Uncontrolled hypertension 06/23/2017   Obesity with serious comorbidity    CKD (chronic kidney disease) stage 3, GFR 30-59 ml/min (HCC)    Dilated cardiomyopathy (HCC) 06/06/2017   Dyspnea 06/05/2017    No past surgical history on file.     Family History  Problem Relation Age of Onset   Diabetes Mother    Diabetes Father     Social History   Tobacco  Use   Smoking status: Every Day    Packs/day: 1.00    Types: Cigarettes   Smokeless tobacco: Never   Tobacco comments:    1 pack last 2-3 days  Substance Use Topics   Alcohol use: Yes    Alcohol/week: 1.0 standard drink    Types: 1 Shots of liquor per week    Comment: socially   Drug use: Yes    Types: Marijuana    Comment: every other week    Home Medications Prior to Admission medications   Medication Sig Start Date End Date Taking? Authorizing Provider  ADVAIR HFA 115-21 MCG/ACT inhaler INHALE 2 PUFFS INTO THE LUNGS 2 (TWO) TIMES DAILY. 07/23/20   Hoy Register, MD  albuterol (VENTOLIN HFA) 108 (90 Base) MCG/ACT inhaler Inhale 2 puffs into the lungs every 4 (four) hours as needed for wheezing or shortness of breath. 10/17/20   Harris, Abigail, PA-C  amLODipine (NORVASC) 10 MG tablet TAKE 1 TABLET (10 MG TOTAL) BY MOUTH DAILY. Patient taking differently: Take 10 mg by mouth daily. 11/06/19 01/07/21  Abelino Derrick, PA-C  apixaban (ELIQUIS) 5 MG TABS tablet Take 1 tablet (5 mg total) by mouth 2 (two) times daily. 01/09/21 04/09/21  Osvaldo Shipper, MD  atorvastatin (LIPITOR) 10 MG tablet TAKE 1 TABLET (10 MG TOTAL) BY MOUTH DAILY. Patient taking differently: Take 10 mg by mouth daily. 11/06/19 01/07/21  Abelino Derrick, PA-C  carvedilol (COREG) 3.125 MG tablet TAKE 1 TABLET (3.125  MG TOTAL) BY MOUTH 2 (TWO) TIMES DAILY WITH A MEAL. Patient taking differently: Take 3.125 mg by mouth 2 (two) times daily with a meal. 11/06/19 01/07/21  Kilroy, Eda PaschalLuke K, PA-C  EPINEPHrine 0.3 mg/0.3 mL IJ SOAJ injection Inject 0.3 mg into the muscle as needed for anaphylaxis. 01/09/21   Osvaldo ShipperKrishnan, Gokul, MD  famotidine (PEPCID) 20 MG tablet Take 1 tablet (20 mg total) by mouth 2 (two) times daily for 14 days. 01/09/21 01/23/21  Osvaldo ShipperKrishnan, Gokul, MD  fluticasone (FLONASE) 50 MCG/ACT nasal spray PLACE 2 SPRAYS INTO BOTH NOSTRILS DAILY. Patient taking differently: Place 2 sprays into both nostrils daily as needed for  allergies. 06/26/20 06/26/21  Marcine MatarJohnson, Deborah B, MD  furosemide (LASIX) 40 MG tablet TAKE 1 TABLET (40 MG TOTAL) BY MOUTH DAILY. Patient taking differently: Take 40 mg by mouth daily. 11/06/19 01/07/21  Abelino DerrickKilroy, Luke K, PA-C  hydrALAZINE (APRESOLINE) 50 MG tablet TAKE 1 TABLET (50 MG TOTAL) BY MOUTH 3 (THREE) TIMES DAILY. Patient taking differently: Take 50 mg by mouth 3 (three) times daily. 11/06/19 01/07/21  Abelino DerrickKilroy, Luke K, PA-C  isosorbide dinitrate (ISORDIL) 20 MG tablet TAKE 1 TABLET (20 MG TOTAL) BY MOUTH 3 (THREE) TIMES DAILY. SCHEDULE OFFICE VISIT Patient taking differently: Take 20 mg by mouth 3 (three) times daily. 10/17/20 10/17/21  Arthor CaptainHarris, Abigail, PA-C  loratadine (CLARITIN) 10 MG tablet Take 1 tablet (10 mg total) by mouth daily. 01/09/21 02/08/21  Osvaldo ShipperKrishnan, Gokul, MD  Multiple Vitamin (MULTIVITAMIN WITH MINERALS) TABS tablet Take 1 tablet by mouth daily. 01/10/21   Osvaldo ShipperKrishnan, Gokul, MD  polyethylene glycol (MIRALAX / GLYCOLAX) 17 g packet Take 17 g by mouth daily as needed for moderate constipation. 01/09/21   Osvaldo ShipperKrishnan, Gokul, MD  predniSONE (DELTASONE) 20 MG tablet Take 3 tablets once daily for 3 days followed by 2 tablets once daily for 3 days followed by 1 tablet once daily for 3 days and then stop 01/09/21   Osvaldo ShipperKrishnan, Gokul, MD  sodium chloride (OCEAN) 0.65 % SOLN nasal spray Place 2 sprays into both nostrils 3 (three) times daily. Patient taking differently: Place 2 sprays into both nostrils as needed for congestion. 04/18/19   Storm FriskWright, Patrick E, MD  tamsulosin (FLOMAX) 0.4 MG CAPS capsule Take 1 capsule (0.4 mg total) by mouth daily after supper. 01/09/21   Osvaldo ShipperKrishnan, Gokul, MD    Allergies    Ace inhibitors, Black cherry fruit extract Valentino Saxon[cherry extract], and [place] flavor [flavoring agent]  Review of Systems   Review of Systems  All other systems reviewed and are negative.  Physical Exam Updated Vital Signs BP (!) 141/63   Pulse 85   Temp 97.8 F (36.6 C) (Oral)   Resp (!) 26   Ht 6'  (1.829 m)   Wt 132 kg   SpO2 96%   BMI 39.47 kg/m   Physical Exam Vitals and nursing note reviewed.  Constitutional:      General: He is not in acute distress.    Appearance: He is well-developed. He is obese. He is not ill-appearing, toxic-appearing or diaphoretic.  HENT:     Head: Normocephalic and atraumatic.     Right Ear: External ear normal.     Left Ear: External ear normal.  Eyes:     Conjunctiva/sclera: Conjunctivae normal.     Pupils: Pupils are equal, round, and reactive to light.  Neck:     Trachea: Phonation normal.  Cardiovascular:     Rate and Rhythm: Normal rate and regular rhythm.  Heart sounds: Normal heart sounds.  Pulmonary:     Effort: Pulmonary effort is normal. No respiratory distress.     Breath sounds: No stridor.     Comments: Decreased breath sounds bilaterally without audible wheezes way rales or rhonchi.  There is no increased work of breathing. Abdominal:     Palpations: Abdomen is soft.     Tenderness: There is no abdominal tenderness.  Musculoskeletal:        General: Normal range of motion.     Cervical back: Normal range of motion and neck supple.  Skin:    General: Skin is warm and dry.  Neurological:     Mental Status: He is alert and oriented to person, place, and time.     Cranial Nerves: No cranial nerve deficit.     Sensory: No sensory deficit.     Motor: No abnormal muscle tone.     Coordination: Coordination normal.  Psychiatric:        Behavior: Behavior normal.        Thought Content: Thought content normal.        Judgment: Judgment normal.    ED Results / Procedures / Treatments   Labs (all labs ordered are listed, but only abnormal results are displayed) Labs Reviewed  BASIC METABOLIC PANEL - Abnormal; Notable for the following components:      Result Value   Glucose, Bld 125 (*)    Calcium 8.8 (*)    All other components within normal limits  CBC WITH DIFFERENTIAL/PLATELET - Abnormal; Notable for the following  components:   Abs Immature Granulocytes 0.28 (*)    All other components within normal limits  TROPONIN I (HIGH SENSITIVITY) - Abnormal; Notable for the following components:   Troponin I (High Sensitivity) 18 (*)    All other components within normal limits  TROPONIN I (HIGH SENSITIVITY)    EKG EKG Interpretation  Date/Time:  Tuesday January 13 2021 06:03:36 EDT Ventricular Rate:  91 PR Interval:  154 QRS Duration: 90 QT Interval:  348 QTC Calculation: 428 R Axis:   2 Text Interpretation: Normal sinus rhythm Nonspecific T wave abnormality Abnormal ECG Confirmed by Alvester Chou [phone]) on 01/13/2021 6:57:55 AM  Radiology DG Chest 2 View  Result Date: 01/13/2021 CLINICAL DATA:  65 year old male with shortness of breath. Chest pain and tightness for several days. Recently discharged from the ICU. EXAM: CHEST - 2 VIEW COMPARISON:  Chest radiographs 01/08/2021 and earlier. FINDINGS: Lower lung volumes today. Stable cardiac size and mediastinal contours. Visualized tracheal air column is within normal limits. No pneumothorax, pulmonary edema, pleural effusion or confluent pulmonary opacity. No acute osseous abnormality identified. Negative visible bowel gas pattern. IMPRESSION: No acute cardiopulmonary abnormality. Electronically Signed   By: Odessa Fleming M.D.   On: 01/13/2021 06:50    Procedures Procedures   Medications Ordered in ED Medications  albuterol (VENTOLIN HFA) 108 (90 Base) MCG/ACT inhaler 2 puff (has no administration in time range)  guaiFENesin-dextromethorphan (ROBITUSSIN DM) 100-10 MG/5ML syrup 5 mL (has no administration in time range)  predniSONE (DELTASONE) tablet 60 mg (60 mg Oral Given 01/13/21 1004)  albuterol (PROVENTIL,VENTOLIN) solution continuous neb (10 mg/hr Nebulization Given 01/13/21 0854)    ED Course  I have reviewed the triage vital signs and the nursing notes.  Pertinent labs & imaging results that were available during my care of the patient were  reviewed by me and considered in my medical decision making (see chart for details).    MDM Rules/Calculators/A&P  Patient Vitals for the past 24 hrs:  BP Temp Temp src Pulse Resp SpO2 Height Weight  01/13/21 1030 (!) 141/63 -- -- 85 (!) 26 96 % -- --  01/13/21 1017 -- -- -- 89 (!) 26 94 % -- --  01/13/21 1000 (!) 170/145 -- -- 84 17 97 % -- --  01/13/21 0930 (!) 165/103 -- -- 79 (!) 21 93 % -- --  01/13/21 0900 (!) 158/102 -- -- 72 10 97 % -- --  01/13/21 0854 -- -- -- -- -- 97 % -- --  01/13/21 0602 (!) 145/103 97.8 F (36.6 C) Oral 92 18 96 % -- --  01/13/21 0559 -- -- -- -- -- -- 6' (1.829 m) 132 kg    10:53 AM Reevaluation with update and discussion. After initial assessment and treatment, an updated evaluation reveals he reports feeling better.  He has contacted his PCP to arrange for follow-up evaluation and treatment.  Findings discussed and questions answered. Mancel Bale   Medical Decision Making:  This patient is presenting for evaluation of pulmonary congestion since intubation, which does require a range of treatment options, and is a complaint that involves a moderate risk of morbidity and mortality. The differential diagnoses include acute bronchitis, tracheitis, COPD exacerbation. I decided to review old records, and in summary millage male with history of substance abuse and tobacco abuse, presenting after hospital discharge following an episode of angioedema which required transient intubation.  He was discharged on prednisone.  He has multiple comorbidities including cardiomyopathy, renal insufficiency, heart failure, hypertension, atrial fibrillation and history of tobacco abuse.  He was using albuterol inhaler, multiple times prior to arrival without relief of his discomfort..  I did not require additional historical information from anyone.  Clinical Laboratory Tests Ordered, included CBC, Metabolic panel, and troponin . Review indicates  normal except glucose high, calcium low, delta troponin stable, mildly elevated.. Radiologic Tests Ordered, included chest x-ray.  I independently Visualized: Radiograph images, which show no acute abnormalities  Cardiac Monitor Tracing which shows normal sinus rhythm     Critical Interventions-clinical evaluation, radiography, laboratory testing, medication treatment, observation and reassessment  After These Interventions, the Patient was reevaluated and was found stable for discharge.  Patient will have likely an element of COPD associated with tobacco abuse and substance abuse, perhaps complicated by recent episode of angioedema requiring intubation.  No evidence for acute cardio or pulmonary failure, requiring hospitalization or other interventions.  He currently has an albuterol inhaler and is on a prednisone taper.  His oxygenation is normal on room air.    CRITICAL CARE-no Performed by: Mancel Bale  Nursing Notes Reviewed/ Care Coordinated Applicable Imaging Reviewed Interpretation of Laboratory Data incorporated into ED treatment  The patient appears reasonably screened and/or stabilized for discharge and I doubt any other medical condition or other [facility] requiring further screening, evaluation, or treatment in the ED at this time prior to discharge.  Plan: Home Medications-continue usual; Home Treatments-continue to avoid tobacco and cocaine; return here if the recommended treatment, does not improve the symptoms; Recommended follow up-PCP, PRN     Final Clinical Impression(s) / ED Diagnoses Final diagnoses:  COPD exacerbation (HCC)    Rx / DC Orders ED Discharge Orders     None        Mancel Bale, MD 01/13/21 1137

## 2021-01-13 NOTE — Discharge Instructions (Addendum)
Testing indicates that you do not have a serious problem like pneumonia or heart failure at this time.  You likely have some bronchitis and/or emphysema causing your symptoms.  This is best treated by continue to avoid tobacco products.  Also use your albuterol inhaler 2 puffs every 3-4 hours as needed for cough or trouble breathing.  Continue taking the prednisone as previously described, and the tapering dose.  Continue taking Mucinex, twice a day to help with congestion.  Follow-up with your primary care doctor for further management and treatment of your condition.

## 2021-01-14 ENCOUNTER — Inpatient Hospital Stay (HOSPITAL_COMMUNITY)
Admission: EM | Admit: 2021-01-14 | Discharge: 2021-01-17 | DRG: 915 | Disposition: A | Discharge status: Home / Self Care | Payer: Medicaid Other | Attending: Critical Care Medicine | Admitting: Critical Care Medicine

## 2021-01-14 ENCOUNTER — Encounter (HOSPITAL_COMMUNITY): Payer: Self-pay | Admitting: Radiology

## 2021-01-14 ENCOUNTER — Other Ambulatory Visit: Payer: Self-pay

## 2021-01-14 DIAGNOSIS — X58XXXA Exposure to other specified factors, initial encounter: Secondary | ICD-10-CM | POA: Diagnosis present

## 2021-01-14 DIAGNOSIS — I10 Essential (primary) hypertension: Secondary | ICD-10-CM

## 2021-01-14 DIAGNOSIS — G4733 Obstructive sleep apnea (adult) (pediatric): Secondary | ICD-10-CM | POA: Diagnosis present

## 2021-01-14 DIAGNOSIS — I5032 Chronic diastolic (congestive) heart failure: Secondary | ICD-10-CM | POA: Diagnosis present

## 2021-01-14 DIAGNOSIS — J309 Allergic rhinitis, unspecified: Secondary | ICD-10-CM | POA: Diagnosis present

## 2021-01-14 DIAGNOSIS — J9601 Acute respiratory failure with hypoxia: Secondary | ICD-10-CM | POA: Diagnosis present

## 2021-01-14 DIAGNOSIS — I429 Cardiomyopathy, unspecified: Secondary | ICD-10-CM

## 2021-01-14 DIAGNOSIS — T782XXA Anaphylactic shock, unspecified, initial encounter: Secondary | ICD-10-CM | POA: Diagnosis present

## 2021-01-14 DIAGNOSIS — L509 Urticaria, unspecified: Secondary | ICD-10-CM

## 2021-01-14 DIAGNOSIS — T783XXA Angioneurotic edema, initial encounter: Principal | ICD-10-CM

## 2021-01-14 DIAGNOSIS — N182 Chronic kidney disease, stage 2 (mild): Secondary | ICD-10-CM | POA: Diagnosis present

## 2021-01-14 DIAGNOSIS — I5042 Chronic combined systolic (congestive) and diastolic (congestive) heart failure: Secondary | ICD-10-CM

## 2021-01-14 DIAGNOSIS — Z833 Family history of diabetes mellitus: Secondary | ICD-10-CM

## 2021-01-14 DIAGNOSIS — Z6841 Body Mass Index (BMI) 40.0 and over, adult: Secondary | ICD-10-CM

## 2021-01-14 DIAGNOSIS — Z7951 Long term (current) use of inhaled steroids: Secondary | ICD-10-CM

## 2021-01-14 DIAGNOSIS — Z91018 Allergy to other foods: Secondary | ICD-10-CM

## 2021-01-14 DIAGNOSIS — I482 Chronic atrial fibrillation, unspecified: Secondary | ICD-10-CM | POA: Diagnosis present

## 2021-01-14 DIAGNOSIS — Z20822 Contact with and (suspected) exposure to covid-19: Secondary | ICD-10-CM | POA: Diagnosis present

## 2021-01-14 DIAGNOSIS — F17210 Nicotine dependence, cigarettes, uncomplicated: Secondary | ICD-10-CM | POA: Diagnosis present

## 2021-01-14 DIAGNOSIS — I130 Hypertensive heart and chronic kidney disease with heart failure and stage 1 through stage 4 chronic kidney disease, or unspecified chronic kidney disease: Secondary | ICD-10-CM | POA: Diagnosis present

## 2021-01-14 DIAGNOSIS — Z79899 Other long term (current) drug therapy: Secondary | ICD-10-CM

## 2021-01-14 DIAGNOSIS — Z888 Allergy status to other drugs, medicaments and biological substances status: Secondary | ICD-10-CM

## 2021-01-14 DIAGNOSIS — Z7901 Long term (current) use of anticoagulants: Secondary | ICD-10-CM

## 2021-01-14 MED ORDER — EPINEPHRINE 0.3 MG/0.3ML IJ SOAJ
0.3000 mg | Freq: Once | INTRAMUSCULAR | Status: DC
  Filled 2021-01-14: qty 0.3

## 2021-01-14 MED ORDER — METHYLPREDNISOLONE SODIUM SUCC 125 MG IJ SOLR
125.0000 mg | Freq: Once | INTRAMUSCULAR | Status: DC
  Filled 2021-01-14: qty 2

## 2021-01-14 MED ORDER — FAMOTIDINE IN NACL 20-0.9 MG/50ML-% IV SOLN
20.0000 mg | Freq: Once | INTRAVENOUS | Status: AC
  Administered 2021-01-14: 20 mg via INTRAVENOUS
  Filled 2021-01-14: qty 50

## 2021-01-14 MED ORDER — DIPHENHYDRAMINE HCL 50 MG/ML IJ SOLN
25.0000 mg | Freq: Once | INTRAMUSCULAR | Status: AC
  Administered 2021-01-14: 25 mg via INTRAVENOUS
  Filled 2021-01-14: qty 1

## 2021-01-14 MED ORDER — RACEPINEPHRINE HCL 2.25 % IN NEBU
0.5000 mL | INHALATION_SOLUTION | Freq: Once | RESPIRATORY_TRACT | Status: AC
  Administered 2021-01-14: 0.5 mL via RESPIRATORY_TRACT
  Filled 2021-01-14: qty 0.5

## 2021-01-14 NOTE — ED Triage Notes (Signed)
Pt complains of allergic reaction. States he used his epi pen and has received solumedrol from [facility] and it feels a little better. Pt states he is unable to cough.

## 2021-01-14 NOTE — ED Provider Notes (Signed)
[facility]-EMERGENCY DEPT Provider Note   CSN: [id number] Arrival date & time: 01/14/21  2206     History Chief Complaint  Patient presents with   Allergic Reaction    Zachary Hawkins is a 65 y.o. male.   Allergic Reaction Presenting symptoms: difficulty swallowing   Presenting symptoms: no rash    65 year old male with a history of recent black cherry mediated anaphylaxis, ACE inhibitor induced angioedema, CHF who was recently hospitalized from 8/28-9/2 requiring intubation and ICU stay for 2 days after concern for anaphylaxis following red cherry consumption.  The patient states that he was discharged on a prednisone taper on 9/2 and has had some difficulty with breathing since discharge.  Tonight, he had a meal and felt an acute worsening in his breathing with a sensation of his throat closing up.  He presented to [facility] health ABC intact, with mild stridor noted and an occasional muffled voice, only able to talk in short phrases..  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     Family History  Problem Relation Age of Onset   Diabetes Mother    Diabetes Father     Social History   Tobacco Use   Smoking status: Every Day    Packs/day: 1.00    Types: Cigarettes   Smokeless tobacco: Never   Tobacco comments:    1 pack last 2-3 days  Substance Use Topics   Alcohol use: Yes    Alcohol/week: 1.0 standard drink    Types: 1 Shots of liquor per week    Comment: socially   Drug use: Yes    Types: Marijuana    Comment: every other week    Home Medications Prior to Admission medications   Medication Sig Start Date End Date Taking? Authorizing Provider  ADVAIR HFA 115-21 MCG/ACT inhaler INHALE 2 PUFFS INTO THE LUNGS 2 (TWO) TIMES DAILY. 07/23/20   Hoy Register, MD  albuterol (VENTOLIN HFA) 108 (90 Base) MCG/ACT inhaler Inhale 2 puffs into the lungs every 4 (four) hours as needed for wheezing or shortness of breath. 10/17/20   Harris, Abigail, PA-C  amLODipine (NORVASC) 10 MG tablet TAKE 1 TABLET (10 MG TOTAL) BY MOUTH DAILY. Patient taking differently: Take 10 mg by mouth daily. 11/06/19 01/07/21  Abelino Derrick, PA-C  apixaban (ELIQUIS) 5 MG TABS tablet Take 1 tablet (5 mg total) by mouth 2 (two) times daily. 01/09/21 04/09/21  Osvaldo Shipper, MD  atorvastatin (LIPITOR) 10 MG tablet TAKE 1 TABLET (10 MG TOTAL) BY MOUTH DAILY. Patient taking differently:  Take 10 mg by mouth daily. 11/06/19 01/07/21  Abelino Derrick, PA-C  carvedilol (COREG) 3.125 MG tablet TAKE 1 TABLET (3.125 MG TOTAL) BY MOUTH 2 (TWO) TIMES DAILY WITH A MEAL. Patient taking differently: Take 3.125 mg by mouth 2 (two) times daily with a meal. 11/06/19 01/07/21  Kilroy, Eda Paschal, PA-C  EPINEPHrine 0.3 mg/0.3 mL IJ SOAJ injection Inject 0.3 mg into the muscle as needed for anaphylaxis. 01/09/21   Osvaldo Shipper, MD  famotidine (PEPCID) 20 MG tablet Take 1 tablet (20 mg total) by mouth 2 (two) times daily for 14 days. 01/09/21 01/23/21  Osvaldo Shipper, MD  fluticasone (FLONASE) 50 MCG/ACT  nasal spray PLACE 2 SPRAYS INTO BOTH NOSTRILS DAILY. Patient taking differently: Place 2 sprays into both nostrils daily as needed for allergies. 06/26/20 06/26/21  Marcine Matar, MD  furosemide (LASIX) 40 MG tablet TAKE 1 TABLET (40 MG TOTAL) BY MOUTH DAILY. Patient taking differently: Take 40 mg by mouth daily. 11/06/19 01/07/21  Abelino Derrick, PA-C  hydrALAZINE (APRESOLINE) 50 MG tablet TAKE 1 TABLET (50 MG TOTAL) BY MOUTH 3 (THREE) TIMES DAILY. Patient taking differently: Take 50 mg by mouth 3 (three) times daily. 11/06/19 01/07/21  Abelino Derrick, PA-C  isosorbide dinitrate (ISORDIL) 20 MG tablet TAKE 1 TABLET (20 MG TOTAL) BY MOUTH 3 (THREE) TIMES DAILY. SCHEDULE OFFICE VISIT Patient taking differently: Take 20 mg by mouth 3 (three) times daily. 10/17/20 10/17/21  Arthor Captain, PA-C  loratadine (CLARITIN) 10 MG tablet Take 1 tablet (10 mg total) by mouth daily. 01/09/21 02/08/21  Osvaldo Shipper, MD  Multiple Vitamin (MULTIVITAMIN WITH MINERALS) TABS tablet Take 1 tablet by mouth daily. 01/10/21   Osvaldo Shipper, MD  polyethylene glycol (MIRALAX / GLYCOLAX) 17 g packet Take 17 g by mouth daily as needed for moderate constipation. 01/09/21   Osvaldo Shipper, MD  predniSONE (DELTASONE) 20 MG tablet Take 3 tablets once daily for 3 days followed by 2 tablets once daily for 3 days followed by 1 tablet once daily for 3 days and then stop 01/09/21   Osvaldo Shipper, MD  sodium chloride (OCEAN) 0.65 % SOLN nasal spray Place 2 sprays into both nostrils 3 (three) times daily. Patient taking differently: Place 2 sprays into both nostrils as needed for congestion. 04/18/19   Storm Frisk, MD  tamsulosin (FLOMAX) 0.4 MG CAPS capsule Take 1 capsule (0.4 mg total) by mouth daily after supper. 01/09/21   Osvaldo Shipper, MD    Allergies    Ace inhibitors, Black cherry fruit extract [name]], and Conservation officer, nature agent]  Review of Systems   Review of Systems  Constitutional:  Negative for  chills and fever.  HENT:  Positive for trouble swallowing and voice change. Negative for ear pain and sore throat.   Eyes:  Negative for pain and visual disturbance.  Respiratory:  Positive for shortness of breath and stridor. Negative for cough.   Cardiovascular:  Negative for chest pain and palpitations.  Gastrointestinal:  Negative for abdominal pain and vomiting.  Genitourinary:  Negative for dysuria and hematuria.  Musculoskeletal:  Negative for arthralgias and back pain.  Skin:  Negative for color change and rash.  Neurological:  Negative for seizures and syncope.  All other systems reviewed and are negative.  Physical Exam Updated Vital Signs There were no vitals taken for this visit.  Physical Exam Vitals and nursing note reviewed.  Constitutional:      General: He is not in acute distress.  Appearance: He is well-developed.     Comments: ABC intact, with mild stridor noted and an occasional muffled voice, only able to talk in short phrases.Marland Kitchen   HENT:     Head: Normocephalic and atraumatic.     Comments: Posterior oropharyngeal swelling present, tongue large but not overly edematous, no lip swelling    Mouth/Throat:     Pharynx: Pharyngeal swelling present. No oropharyngeal exudate.  Eyes:     Conjunctiva/sclera: Conjunctivae normal.  Cardiovascular:     Rate and Rhythm: Normal rate and regular rhythm.     Heart sounds: No murmur heard. Pulmonary:     Effort: Pulmonary effort is normal. Tachypnea present. No respiratory distress.     Breath sounds: Stridor present. Examination of the right-upper field reveals decreased breath sounds. Examination of the left-upper field reveals decreased breath sounds. Decreased breath sounds present.  Abdominal:     Palpations: Abdomen is soft.     Tenderness: There is no abdominal tenderness.  Musculoskeletal:     Cervical back: Neck supple.  Skin:    General: Skin is warm and dry.  Neurological:     General: No focal deficit  present.     Mental Status: He is alert and oriented to person, place, and time.    ED Results / Procedures / Treatments   Labs (all labs ordered are listed, but only abnormal results are displayed) Labs Reviewed - No data to display  EKG None  Radiology DG Chest 2 View  Result Date: 01/13/2021 CLINICAL DATA:  65 year old male with shortness of breath. Chest pain and tightness for several days. Recently discharged from the ICU. EXAM: CHEST - 2 VIEW COMPARISON:  Chest radiographs 01/08/2021 and earlier. FINDINGS: Lower lung volumes today. Stable cardiac size and mediastinal contours. Visualized tracheal air column is within normal limits. No pneumothorax, pulmonary edema, pleural effusion or confluent pulmonary opacity. No acute osseous abnormality identified. Negative visible bowel gas pattern. IMPRESSION: No acute cardiopulmonary abnormality. Electronically Signed   By: Odessa Fleming M.D.   On: 01/13/2021 06:50    Procedures .Critical Care  Date/Time: 01/14/2021 11:30 PM Performed by: Ernie Avena, MD Authorized by: Ernie Avena, MD   Critical care provider statement:    Critical care time (minutes):  38   Critical care was time spent personally by me on the following activities:  Discussions with consultants, evaluation of patient's response to treatment, examination of patient, ordering and performing treatments and interventions, ordering and review of laboratory studies, ordering and review of radiographic studies, pulse oximetry, re-evaluation of patient's condition, obtaining history from patient or surrogate and review of old charts   Medications Ordered in ED Medications - No data to display  ED Course  I have reviewed the triage vital signs and the nursing notes.  Pertinent labs & imaging results that were available during my care of the patient were reviewed by me and considered in my medical decision making (see chart for details).    MDM Rules/Calculators/A&P                            65 year old male with medical history as above including recent admission for intubation in the setting of anaphylactic reaction to cherries, known ACE inhibitor induced angioedema, presenting to the emergency department with new throat swelling and itching after eating dinner tonight.  He was previously discharged on a prednisone taper.  He feels that his throat swelling acutely worsened tonight after eating and  he felt like he could not breathe as his throat was closing up.  He felt some itching in the back of his throat.  He arrived to the emergency department ABC intact, mild stridor at rest noted, intermittently muffled voice noted.  Concern for significant posterior oropharyngeal swelling.  The patient was administered epinephrine and Solu-Medrol with EMS.  He was administered in the ED Pepcid and IV Benadryl in addition to racemic epinephrine.  The patient did not require emergent intubation on arrival.  He was tolerating his secretions and was able to swallow a small amount of water which improved his muffled voice somewhat.  Given his recent history, concern for recurrent angioedema with a sensation of pharyngeal swelling and closing of his throat triggered after eating food earlier tonight.  The patient is unclear which food item may have triggered this presentation.  Given the potential for sudden worsening deterioration of the patient's condition, critical care was consulted for admission.  Plan at time of signout to observe the patient in the ED while awaiting critical care admission.  Signout given to Dr. Preston FleetingGlick at [phone].   Final Clinical Impression(s) / ED Diagnoses Final diagnoses:  None    Rx / DC Orders ED Discharge Orders     None        Ernie AvenaLawsing, Sharlie Shreffler, MD 01/15/21 [id number]

## 2021-01-15 DIAGNOSIS — J9611 Chronic respiratory failure with hypoxia: Secondary | ICD-10-CM | POA: Diagnosis not present

## 2021-01-15 DIAGNOSIS — Z91018 Allergy to other foods: Secondary | ICD-10-CM | POA: Diagnosis not present

## 2021-01-15 DIAGNOSIS — Z888 Allergy status to other drugs, medicaments and biological substances status: Secondary | ICD-10-CM | POA: Diagnosis not present

## 2021-01-15 DIAGNOSIS — J309 Allergic rhinitis, unspecified: Secondary | ICD-10-CM | POA: Diagnosis present

## 2021-01-15 DIAGNOSIS — T783XXA Angioneurotic edema, initial encounter: Secondary | ICD-10-CM | POA: Diagnosis not present

## 2021-01-15 DIAGNOSIS — I5032 Chronic diastolic (congestive) heart failure: Secondary | ICD-10-CM | POA: Diagnosis not present

## 2021-01-15 DIAGNOSIS — N182 Chronic kidney disease, stage 2 (mild): Secondary | ICD-10-CM | POA: Diagnosis present

## 2021-01-15 DIAGNOSIS — T782XXA Anaphylactic shock, unspecified, initial encounter: Secondary | ICD-10-CM | POA: Diagnosis present

## 2021-01-15 DIAGNOSIS — R0602 Shortness of breath: Secondary | ICD-10-CM | POA: Diagnosis not present

## 2021-01-15 DIAGNOSIS — Z7901 Long term (current) use of anticoagulants: Secondary | ICD-10-CM | POA: Diagnosis not present

## 2021-01-15 DIAGNOSIS — Z6841 Body Mass Index (BMI) 40.0 and over, adult: Secondary | ICD-10-CM | POA: Diagnosis not present

## 2021-01-15 DIAGNOSIS — I429 Cardiomyopathy, unspecified: Secondary | ICD-10-CM | POA: Diagnosis not present

## 2021-01-15 DIAGNOSIS — I482 Chronic atrial fibrillation, unspecified: Secondary | ICD-10-CM | POA: Diagnosis present

## 2021-01-15 DIAGNOSIS — J9601 Acute respiratory failure with hypoxia: Secondary | ICD-10-CM | POA: Diagnosis present

## 2021-01-15 DIAGNOSIS — I10 Essential (primary) hypertension: Secondary | ICD-10-CM | POA: Diagnosis not present

## 2021-01-15 DIAGNOSIS — L509 Urticaria, unspecified: Secondary | ICD-10-CM | POA: Diagnosis present

## 2021-01-15 DIAGNOSIS — Z7951 Long term (current) use of inhaled steroids: Secondary | ICD-10-CM | POA: Diagnosis not present

## 2021-01-15 DIAGNOSIS — Z833 Family history of diabetes mellitus: Secondary | ICD-10-CM | POA: Diagnosis not present

## 2021-01-15 DIAGNOSIS — G4733 Obstructive sleep apnea (adult) (pediatric): Secondary | ICD-10-CM | POA: Diagnosis present

## 2021-01-15 DIAGNOSIS — F17210 Nicotine dependence, cigarettes, uncomplicated: Secondary | ICD-10-CM | POA: Diagnosis present

## 2021-01-15 DIAGNOSIS — Z20822 Contact with and (suspected) exposure to covid-19: Secondary | ICD-10-CM | POA: Diagnosis present

## 2021-01-15 DIAGNOSIS — Z79899 Other long term (current) drug therapy: Secondary | ICD-10-CM | POA: Diagnosis not present

## 2021-01-15 DIAGNOSIS — I130 Hypertensive heart and chronic kidney disease with heart failure and stage 1 through stage 4 chronic kidney disease, or unspecified chronic kidney disease: Secondary | ICD-10-CM | POA: Diagnosis present

## 2021-01-15 DIAGNOSIS — X58XXXA Exposure to other specified factors, initial encounter: Secondary | ICD-10-CM | POA: Diagnosis present

## 2021-01-15 LAB — CBC
HCT: 48.8 % (ref 39.0–52.0)
Hemoglobin: 15.3 g/dL (ref 13.0–17.0)
MCH: 29.1 pg (ref 26.0–34.0)
MCHC: 31.4 g/dL (ref 30.0–36.0)
MCV: 93 fL (ref 80.0–100.0)
Platelets: 310 10*3/uL (ref 150–400)
RBC: 5.25 MIL/uL (ref 4.22–5.81)
RDW: 15.2 % (ref 11.5–15.5)
WBC: 13.2 10*3/uL — ABNORMAL HIGH (ref 4.0–10.5)
nRBC: 0 % (ref 0.0–0.2)

## 2021-01-15 LAB — MRSA NEXT GEN BY PCR, NASAL: MRSA by PCR Next Gen: NOT DETECTED

## 2021-01-15 LAB — CREATININE, SERUM
Creatinine, Ser: 1.05 mg/dL (ref 0.61–1.24)
GFR, Estimated: >60 mL/min (ref >60)

## 2021-01-15 LAB — PROCALCITONIN: Procalcitonin: <0.1 ng/mL

## 2021-01-15 LAB — HEPARIN LEVEL (UNFRACTIONATED): Heparin Unfractionated: 0.97 IU/mL — ABNORMAL HIGH (ref 0.30–0.70)

## 2021-01-15 LAB — APTT
aPTT: 33 seconds (ref 24–36)
aPTT: 56 seconds — ABNORMAL HIGH (ref 24–36)

## 2021-01-15 LAB — SARS CORONAVIRUS 2 BY RT PCR (HOSPITAL ORDER, PERFORMED IN ~~LOC~~ HOSPITAL LAB): SARS Coronavirus 2: NEGATIVE

## 2021-01-15 MED ORDER — ENOXAPARIN SODIUM 40 MG/0.4ML IJ SOSY: 40.0000 mg | PREFILLED_SYRINGE | INTRAMUSCULAR | Status: DC

## 2021-01-15 MED ORDER — HYDRALAZINE HCL 20 MG/ML IJ SOLN
10.0000 mg | INTRAMUSCULAR | Status: DC | PRN
  Administered 2021-01-15: 20 mg via INTRAVENOUS
  Filled 2021-01-15: qty 1

## 2021-01-15 MED ORDER — SODIUM CHLORIDE 0.9 % IV SOLN
2.0000 g | INTRAVENOUS | Status: DC
  Administered 2021-01-15: 2 g via INTRAVENOUS
  Filled 2021-01-15: qty 20

## 2021-01-15 MED ORDER — CHLORHEXIDINE GLUCONATE CLOTH 2 % EX PADS
6.0000 | MEDICATED_PAD | Freq: Every day | CUTANEOUS | Status: DC
  Administered 2021-01-15 – 2021-01-17 (×3): 6 via TOPICAL

## 2021-01-15 MED ORDER — SODIUM CHLORIDE 0.9 % IV SOLN
500.0000 mg | INTRAVENOUS | Status: DC
  Filled 2021-01-15: qty 500

## 2021-01-15 MED ORDER — FAMOTIDINE 20 MG IN NS 100 ML IVPB
20.0000 mg | Freq: Two times a day (BID) | INTRAVENOUS | Status: DC
  Administered 2021-01-15 – 2021-01-17 (×5): 20 mg via INTRAVENOUS
  Filled 2021-01-15 (×5): qty 100

## 2021-01-15 MED ORDER — BUDESONIDE 0.5 MG/2ML IN SUSP
0.5000 mg | Freq: Two times a day (BID) | RESPIRATORY_TRACT | Status: DC
  Administered 2021-01-15 – 2021-01-17 (×5): 0.5 mg via RESPIRATORY_TRACT
  Filled 2021-01-15 (×5): qty 2

## 2021-01-15 MED ORDER — ARFORMOTEROL TARTRATE 15 MCG/2ML IN NEBU
15.0000 ug | INHALATION_SOLUTION | Freq: Two times a day (BID) | RESPIRATORY_TRACT | Status: DC
  Administered 2021-01-15 – 2021-01-17 (×5): 15 ug via RESPIRATORY_TRACT
  Filled 2021-01-15 (×5): qty 2

## 2021-01-15 MED ORDER — LABETALOL HCL 5 MG/ML IV SOLN
10.0000 mg | Freq: Four times a day (QID) | INTRAVENOUS | Status: DC
  Administered 2021-01-15 – 2021-01-16 (×4): 10 mg via INTRAVENOUS
  Filled 2021-01-15 (×5): qty 4

## 2021-01-15 MED ORDER — FUROSEMIDE 10 MG/ML IJ SOLN
40.0000 mg | Freq: Once | INTRAMUSCULAR | Status: AC
  Administered 2021-01-15: 40 mg via INTRAVENOUS
  Filled 2021-01-15: qty 4

## 2021-01-15 MED ORDER — HEPARIN (PORCINE) 25000 UT/250ML-% IV SOLN
1900.0000 [IU]/h | INTRAVENOUS | Status: DC
  Administered 2021-01-15: 1900 [IU]/h via INTRAVENOUS
  Administered 2021-01-15: 1400 [IU]/h via INTRAVENOUS
  Filled 2021-01-15 (×2): qty 250

## 2021-01-15 MED ORDER — DOCUSATE SODIUM 100 MG PO CAPS: 100.0000 mg | ORAL_CAPSULE | Freq: Two times a day (BID) | ORAL | Status: DC | PRN

## 2021-01-15 MED ORDER — POLYETHYLENE GLYCOL 3350 17 G PO PACK: 17.0000 g | PACK | Freq: Every day | ORAL | Status: DC | PRN

## 2021-01-15 MED ORDER — LEVETIRACETAM IN NACL 1000 MG/100ML IV SOLN: 1000.0000 mg | Freq: Two times a day (BID) | INTRAVENOUS | Status: DC

## 2021-01-15 MED ORDER — ALBUTEROL SULFATE (2.5 MG/3ML) 0.083% IN NEBU: 2.5000 mg | INHALATION_SOLUTION | Freq: Four times a day (QID) | RESPIRATORY_TRACT | Status: DC | PRN

## 2021-01-15 MED ORDER — ONDANSETRON HCL 4 MG/2ML IJ SOLN: 4.0000 mg | Freq: Four times a day (QID) | INTRAMUSCULAR | Status: DC | PRN

## 2021-01-15 NOTE — ED Notes (Signed)
Spoke with e link regarding patients BP of 174/122. They will relay the message to the doctor and orders will be placed.

## 2021-01-15 NOTE — Progress Notes (Addendum)
eLink Physician-Brief Progress Note Patient Name: Zachary Hawkins DOB: Sep 11, 1955 MRN: [id number]   Date of Service  01/15/2021  HPI/Events of Note  Patient admitted with angioedema and currently NPO, he has a history of hypertension and takes oral medications at home, current BP is 186/115.  eICU Interventions  New Patient Evaluation. PRN iv Hydralazine ordered.        Thomasene Lot Xochilth Standish 01/15/2021, 4:08 AM

## 2021-01-15 NOTE — Progress Notes (Signed)
ANTICOAGULATION CONSULT NOTE   Pharmacy Consult for heparin  Indication: atrial fibrillation  Allergies  Allergen Reactions   Ace Inhibitors Anaphylaxis   Black Cherry Fruit Hawkins Zachary Hawkins] Anaphylaxis    Tongue   Grenadine Flavor [Flavoring Agent] Anaphylaxis    Patient Measurements: Height: 6' (182.9 cm) Weight: 135.2 kg (298 lb 1 oz) IBW/kg (Calculated) : 77.6 Heparin Dosing Weight: 107kg  Vital Signs: Temp: 97.1 F (36.2 C) (09/08 1126) Temp Source: Oral (09/08 1126) BP: 126/101 (09/08 1200) Pulse Rate: 109 (09/08 1200)  Labs: Recent Labs    01/13/21 0705 01/13/21 0715 01/13/21 0905 01/15/21 0311 01/15/21 1305  HGB  --  14.2  --  15.3  --   HCT  --  46.5  --  48.8  --   PLT  --  304  --  310  --   APTT  --   --   --   --  33  HEPARINUNFRC  --   --   --   --  0.97*  CREATININE 1.12  --   --  1.05  --   TROPONINIHS 18*  --  16  --   --      Estimated Creatinine Clearance: 102.5 mL/min (by C-G formula based on SCr of 1.05 mg/dL).   Medical History: Past Medical History:  Diagnosis Date   Cardiomyopathy (HCC) 06/2017   EF 40-45%   Chronic renal insufficiency, stage 3 (moderate) (HCC)    Diastolic dysfunction 06/2017   grade 2    Hypertension    Hypertensive urgency    Obesity      Assessment: 65 year old male with history of afib on apixaban prior to admit. Patient with recent angioedema and was admitted overnight for concern of relapse and currently is unable to take po meds. Will transition to heparin.   Initial heparin level altered by DOAC, aPTT normal (subtherapeutic).   Goal of Therapy:  Heparin level 0.3-0.7 units/ml aPTT 66-102 seconds Monitor platelets by anticoagulation protocol: Yes   Plan:  Increase heparin to 1700 units/h Recheck aPTT in 6h  [name], PharmD, [name], [name] Clinical Pharmacist [phone] Please check AMION for all [name] Pharmacy numbers 01/15/2021

## 2021-01-15 NOTE — Progress Notes (Signed)
ANTICOAGULATION CONSULT NOTE   Pharmacy Consult for heparin  Indication: atrial fibrillation  Allergies  Allergen Reactions   Ace Inhibitors Anaphylaxis   Black Cherry Fruit Extract [name]] Anaphylaxis    Tongue   Grenadine Flavor [Flavoring Agent] Anaphylaxis    Patient Measurements: Height: 6' (182.9 cm) Weight: 135.2 kg (298 lb 1 oz) IBW/kg (Calculated) : 77.6 Heparin Dosing Weight: 107kg  Vital Signs: Temp: 98.7 F (37.1 C) (09/07 2236) Temp Source: Oral (09/07 2236) BP: 187/117 (09/08 0446) Pulse Rate: 96 (09/08 0310)  Labs: Recent Labs    01/13/21 0705 01/13/21 0715 01/13/21 0905 01/15/21 0311  HGB  --  14.2  --  15.3  HCT  --  46.5  --  48.8  PLT  --  304  --  310  CREATININE 1.12  --   --  1.05  TROPONINIHS 18*  --  16  --     Estimated Creatinine Clearance: 102.5 mL/min (by C-G formula based on SCr of 1.05 mg/dL).   Medical History: Past Medical History:  Diagnosis Date   Cardiomyopathy (HCC) 06/2017   EF 40-45%   Chronic renal insufficiency, stage 3 (moderate) (HCC)    Diastolic dysfunction 06/2017   grade 2    Hypertension    Hypertensive urgency    Obesity      Assessment: 65 year old male with history of afib on apixaban prior to admit. Patient with recent angioedema and was admitted overnight for concern of relapse and currently is unable to take po meds. Will transition to heparin. CBC appears normal.   Given recent apixaban use will monitor by aptt until they correlate.   Goal of Therapy:  Heparin level 0.3-0.7 units/ml aPTT 66-102 seconds Monitor platelets by anticoagulation protocol: Yes   Plan:  Start heparin infusion at 1400 units/hr Check anti-Xa level and aptt in 6-8 hours and daily while on heparin Continue to monitor H&H and platelets  Sheppard Coil PharmD., BCPS Clinical Pharmacist 01/15/2021 6:36 AM

## 2021-01-15 NOTE — Progress Notes (Signed)
ANTICOAGULATION CONSULT NOTE   Pharmacy Consult for heparin  Indication: atrial fibrillation  Allergies  Allergen Reactions   Ace Inhibitors Anaphylaxis   Black Cherry Fruit Extract [name]] Anaphylaxis    Tongue   Grenadine Flavor [Flavoring Agent] Anaphylaxis    Patient Measurements: Height: 6' (182.9 cm) Weight: 135.2 kg (298 lb 1 oz) IBW/kg (Calculated) : 77.6 Heparin Dosing Weight: 107kg  Vital Signs: Temp: 99 F (37.2 C) (09/08 1900) Temp Source: Oral (09/08 1900) BP: 152/97 (09/08 2000) Pulse Rate: 78 (09/08 2000)  Labs: Recent Labs    01/13/21 0705 01/13/21 0715 01/13/21 0905 01/15/21 0311 01/15/21 1305 01/15/21 2130  HGB  --  14.2  --  15.3  --   --   HCT  --  46.5  --  48.8  --   --   PLT  --  304  --  310  --   --   APTT  --   --   --   --  33 56*  HEPARINUNFRC  --   --   --   --  0.97*  --   CREATININE 1.12  --   --  1.05  --   --   TROPONINIHS 18*  --  16  --   --   --      Estimated Creatinine Clearance: 102.5 mL/min (by C-G formula based on SCr of 1.05 mg/dL).   Medical History: Past Medical History:  Diagnosis Date   Cardiomyopathy (HCC) 06/2017   EF 40-45%   Chronic renal insufficiency, stage 3 (moderate) (HCC)    Diastolic dysfunction 06/2017   grade 2    Hypertension    Hypertensive urgency    Obesity      Assessment: 65 year old male with history of afib on apixaban prior to admit. Patient with recent angioedema and was admitted overnight for concern of relapse and currently is unable to take po meds. Will transition to heparin.   Initial heparin level altered by DOAC, aPTT normal (subtherapeutic).   aPTT back at 56 - just below goal.  Goal of Therapy:  Heparin level 0.3-0.7 units/ml aPTT 66-102 seconds Monitor platelets by anticoagulation protocol: Yes   Plan:  Increase heparin to 1900 units/h Recheck aPTT in 6h Monitor CBC; plt; daily aptt/heparin levels  Delmar Landau, PharmD, BCPS 01/15/2021 11:07 PM ED  Clinical Pharmacist -  [phone]

## 2021-01-15 NOTE — Progress Notes (Signed)
Seen, protecting airway.  Keep in ICU today to make sure stays this way. Will need allergist referral on DC.

## 2021-01-15 NOTE — H&P (Addendum)
NAME:  Zachary Hawkins, MRN:  [id number], DOB:  [birth year], LOS: 0 ADMISSION DATE:  01/14/2021, CONSULTATION DATE:  01/15/21 REFERRING MD:  EDP, CHIEF COMPLAINT:  anaphylaxis   History of Present Illness:  Zachary Hawkins is a 65 y.o. M with PMH of cardiomyopathy, Afib on eliquis, and HFpEF, ACE-I angioedema 10/2020 and anaphylaxis secondary to black cherries requiring recent hospitalization and intubation and discharged home with steroid taper on 9/2.   He ate dinner and felt short of breath and as if his throat was closing up, so presented to the ED after giving himself an epi pen injection  In the ED, his vitals were stable, he was able to swallow his secretions and sip water.   He was given benadryl and solumedrol, given his history of airway compromise PCCM consulted for transfer to [facility] for possible ENT consult.  Pertinent  Medical History   has a past medical history of Cardiomyopathy (HCC) (06/2017), Chronic renal insufficiency, stage 3 (moderate) (HCC), Diastolic dysfunction (06/2017), Hypertension, Hypertensive urgency, and Obesity.   Significant Hospital Events: Including procedures, antibiotic start and stop dates in addition to other pertinent events   9/8 admit to [facility], hemodynamically stable  Interim History / Subjective:  Pt still feels that his throat is itchy and tight, though improved from arrival  Objective   Blood pressure (!) 173/114, pulse 86, temperature 98.7 F (37.1 C), temperature source Oral, resp. rate (!) 24, height 6' (1.829 m), weight 132 kg, SpO2 93 %.        Intake/Output Summary (Last 24 hours) at 01/15/2021 0212 Last data filed at 01/15/2021 0046 Gross per 24 hour  Intake 98.33 ml  Output --  Net 98.33 ml   Filed Weights   01/14/21 2237  Weight: 132 kg   General:  obese M, awake, sitting up in bed and in no distress HEENT: MM pink/moist, no facial, tongue or lip swelling, hoarse voice, swallowing secretions Neuro: awake and alert, oriented and moving  all extremities CV: s1s2 rrr, no m/r/g PULM:  lungs clear bilaterally without wheezing or tachypnea GI: soft, bsx4 active  Extremities: warm/dry, no edema  Skin: no rashes or lesions   Resolved Hospital Problem list     Assessment & Plan:    Shortness of breath and concern for anaphylaxis/angioedema History OSA Pt reports eating chicken and onions prior to admission, nothing he has ever reacted to prior.  No severe edema and hypertensive since arrival P: -no hypotension so less likely anaphylaxis -admit to ICU and closely monitor for worsening angio-edema and airway compromise -continue famotidine and solumedrol -continue home Advair and Albuterol  Chronic Atrial fibrillation, HFpEF, Cardiomyopathy, HL Pt unable to swallow pills at this time, so holding home meds P: -heparin gtt instead of Eliquis -labetalol and prn hydralazine  -hold statin -Lasix IV       Best Practice (right click and "Reselect all SmartList Selections" daily)   Diet/type: NPO DVT prophylaxis: systemic heparin GI prophylaxis: H2B Lines: N/A Foley:  N/A Code Status:  full code Last date of multidisciplinary goals of care discussion []   Labs   CBC: Recent Labs  Lab 01/13/21 0715  WBC 10.1  NEUTROABS 6.1  HGB 14.2  HCT 46.5  MCV 93.4  PLT 304    Basic Metabolic Panel: Recent Labs  Lab 01/09/21 0416 01/13/21 0705  NA 137 138  K 3.9 3.8  CL 97* 101  CO2 33* 30  GLUCOSE 96 125*  BUN 20 18  CREATININE 1.10 1.12  CALCIUM 8.9 8.8*   GFR: Estimated Creatinine Clearance: 94.9 mL/min (by C-G formula based on SCr of 1.12 mg/dL). Recent Labs  Lab 01/13/21 0715  WBC 10.1    Liver Function Tests: No results for input(s): AST, ALT, ALKPHOS, BILITOT, PROT, ALBUMIN in the last 168 hours. No results for input(s): LIPASE, AMYLASE in the last 168 hours. No results for input(s): AMMONIA in the last 168 hours.  ABG    Component Value Date/Time   PHART 7.348 (L) 01/04/2021 0919    PCO2ART 54.1 (H) 01/04/2021 0919   PO2ART 85 01/04/2021 0919   HCO3 29.8 (H) 01/04/2021 0919   TCO2 31 01/04/2021 0919   O2SAT 96.0 01/04/2021 0919     Coagulation Profile: No results for input(s): INR, PROTIME in the last 168 hours.  Cardiac Enzymes: No results for input(s): CKTOTAL, CKMB, CKMBINDEX, TROPONINI in the last 168 hours.  HbA1C: No results found for: HGBA1C  CBG: Recent Labs  Lab 01/08/21 0740 01/08/21 1125 01/08/21 1620 01/08/21 2209 01/09/21 0718  GLUCAP 128* 106* 120* 97 94    Review of Systems:   Review of Systems  Constitutional:  Negative for chills and fever.  Respiratory:  Positive for shortness of breath. Negative for cough and wheezing.   Cardiovascular:  Negative for chest pain.  Skin:  Positive for itching. Negative for rash.    Past Medical History:  He,  has a past medical history of Cardiomyopathy (HCC) (06/2017), Chronic renal insufficiency, stage 3 (moderate) (HCC), Diastolic dysfunction (06/2017), Hypertension, Hypertensive urgency, and Obesity.   Surgical History:  History reviewed. No pertinent surgical history.   Social History:   reports that he has been smoking cigarettes. He has been smoking an average of 1 pack per day. He has never used smokeless tobacco. He reports current alcohol use of about 1.0 standard drink per week. He reports current drug use. Drug: Marijuana.   Family History:  His family history includes Diabetes in his father and mother.   Allergies Allergies  Allergen Reactions   Ace Inhibitors Anaphylaxis   Black Cherry Fruit Extract [name]] Anaphylaxis    Tongue   Grenadine Flavor [Flavoring Agent] Anaphylaxis     Home Medications  Prior to Admission medications   Medication Sig Start Date End Date Taking? Authorizing Provider  ADVAIR HFA 115-21 MCG/ACT inhaler INHALE 2 PUFFS INTO THE LUNGS 2 (TWO) TIMES DAILY. 07/23/20   Hoy Register, MD  albuterol (VENTOLIN HFA) 108 (90 Base) MCG/ACT inhaler  Inhale 2 puffs into the lungs every 4 (four) hours as needed for wheezing or shortness of breath. 10/17/20   Harris, Abigail, PA-C  amLODipine (NORVASC) 10 MG tablet TAKE 1 TABLET (10 MG TOTAL) BY MOUTH DAILY. Patient taking differently: Take 10 mg by mouth daily. 11/06/19 01/07/21  Abelino Derrick, PA-C  apixaban (ELIQUIS) 5 MG TABS tablet Take 1 tablet (5 mg total) by mouth 2 (two) times daily. 01/09/21 04/09/21  Osvaldo Shipper, MD  atorvastatin (LIPITOR) 10 MG tablet TAKE 1 TABLET (10 MG TOTAL) BY MOUTH DAILY. Patient taking differently: Take 10 mg by mouth daily. 11/06/19 01/07/21  Abelino Derrick, PA-C  carvedilol (COREG) 3.125 MG tablet TAKE 1 TABLET (3.125 MG TOTAL) BY MOUTH 2 (TWO) TIMES DAILY WITH A MEAL. Patient taking differently: Take 3.125 mg by mouth 2 (two) times daily with a meal. 11/06/19 01/07/21  Kilroy, Eda Paschal, PA-C  EPINEPHrine 0.3 mg/0.3 mL IJ SOAJ injection Inject 0.3 mg into the muscle as needed for anaphylaxis. 01/09/21  Osvaldo Shipper, MD  famotidine (PEPCID) 20 MG tablet Take 1 tablet (20 mg total) by mouth 2 (two) times daily for 14 days. 01/09/21 01/23/21  Osvaldo Shipper, MD  fluticasone (FLONASE) 50 MCG/ACT nasal spray PLACE 2 SPRAYS INTO BOTH NOSTRILS DAILY. Patient taking differently: Place 2 sprays into both nostrils daily as needed for allergies. 06/26/20 06/26/21  Marcine Matar, MD  furosemide (LASIX) 40 MG tablet TAKE 1 TABLET (40 MG TOTAL) BY MOUTH DAILY. Patient taking differently: Take 40 mg by mouth daily. 11/06/19 01/07/21  Abelino Derrick, PA-C  hydrALAZINE (APRESOLINE) 50 MG tablet TAKE 1 TABLET (50 MG TOTAL) BY MOUTH 3 (THREE) TIMES DAILY. Patient taking differently: Take 50 mg by mouth 3 (three) times daily. 11/06/19 01/07/21  Abelino Derrick, PA-C  isosorbide dinitrate (ISORDIL) 20 MG tablet TAKE 1 TABLET (20 MG TOTAL) BY MOUTH 3 (THREE) TIMES DAILY. SCHEDULE OFFICE VISIT Patient taking differently: Take 20 mg by mouth 3 (three) times daily. 10/17/20 10/17/21  Arthor Captain, PA-C  loratadine (CLARITIN) 10 MG tablet Take 1 tablet (10 mg total) by mouth daily. 01/09/21 02/08/21  Osvaldo Shipper, MD  Multiple Vitamin (MULTIVITAMIN WITH MINERALS) TABS tablet Take 1 tablet by mouth daily. 01/10/21   Osvaldo Shipper, MD  polyethylene glycol (MIRALAX / GLYCOLAX) 17 g packet Take 17 g by mouth daily as needed for moderate constipation. 01/09/21   Osvaldo Shipper, MD  predniSONE (DELTASONE) 20 MG tablet Take 3 tablets once daily for 3 days followed by 2 tablets once daily for 3 days followed by 1 tablet once daily for 3 days and then stop 01/09/21   Osvaldo Shipper, MD  sodium chloride (OCEAN) 0.65 % SOLN nasal spray Place 2 sprays into both nostrils 3 (three) times daily. Patient taking differently: Place 2 sprays into both nostrils as needed for congestion. 04/18/19   Storm Frisk, MD  tamsulosin (FLOMAX) 0.4 MG CAPS capsule Take 1 capsule (0.4 mg total) by mouth daily after supper. 01/09/21   Osvaldo Shipper, MD     Critical care time: 35 minutes      CRITICAL CARE Performed by: Darcella Gasman Caffie Sotto   Total critical care time: 35 minutes  Critical care time was exclusive of separately billable procedures and treating other patients.  Critical care was necessary to treat or prevent imminent or life-threatening deterioration.  Critical care was time spent personally by me on the following activities: development of treatment plan with patient and/or surrogate as well as nursing, discussions with consultants, evaluation of patient's response to treatment, examination of patient, obtaining history from patient or surrogate, ordering and performing treatments and interventions, ordering and review of laboratory studies, ordering and review of radiographic studies, pulse oximetry and re-evaluation of patient's condition.  Darcella Gasman Zabdiel Dripps, PA-C [facility] See Amion for pager If no response to pager , please call [phone] until 7pm After 7:00 pm call  Elink  [phone]

## 2021-01-15 NOTE — Plan of Care (Signed)
  Problem: Clinical Measurements: Goal: Respiratory complications will improve Outcome: Progressing   Problem: Activity: Goal: Risk for activity intolerance will decrease Outcome: Progressing   Problem: Coping: Goal: Level of anxiety will decrease Outcome: Progressing   Problem: Elimination: Goal: Will not experience complications related to bowel motility Outcome: Progressing Goal: Will not experience complications related to urinary retention Outcome: Progressing   Problem: Pain Managment: Goal: General experience of comfort will improve Outcome: Progressing

## 2021-01-16 DIAGNOSIS — I10 Essential (primary) hypertension: Secondary | ICD-10-CM

## 2021-01-16 DIAGNOSIS — J9601 Acute respiratory failure with hypoxia: Secondary | ICD-10-CM | POA: Diagnosis not present

## 2021-01-16 DIAGNOSIS — T783XXA Angioneurotic edema, initial encounter: Secondary | ICD-10-CM | POA: Diagnosis not present

## 2021-01-16 DIAGNOSIS — I5032 Chronic diastolic (congestive) heart failure: Secondary | ICD-10-CM | POA: Diagnosis not present

## 2021-01-16 LAB — BASIC METABOLIC PANEL
Anion gap: 13 (ref 5–15)
BUN: 16 mg/dL (ref 8–23)
CO2: 31 mmol/L (ref 22–32)
Calcium: 9.3 mg/dL (ref 8.9–10.3)
Chloride: 96 mmol/L — ABNORMAL LOW (ref 98–111)
Creatinine, Ser: 1.04 mg/dL (ref 0.61–1.24)
GFR, Estimated: >60 mL/min (ref >60)
Glucose, Bld: 103 mg/dL — ABNORMAL HIGH (ref 70–99)
Potassium: 3.6 mmol/L (ref 3.5–5.1)
Sodium: 140 mmol/L (ref 135–145)

## 2021-01-16 LAB — HEPARIN LEVEL (UNFRACTIONATED): Heparin Unfractionated: 0.82 IU/mL — ABNORMAL HIGH (ref 0.30–0.70)

## 2021-01-16 LAB — C1 ESTERASE INHIBITOR: C1INH SerPl-mCnc: 52 mg/dL — ABNORMAL HIGH (ref 21–39)

## 2021-01-16 LAB — CBC
HCT: 46.5 % (ref 39.0–52.0)
Hemoglobin: 14.8 g/dL (ref 13.0–17.0)
MCH: 29.2 pg (ref 26.0–34.0)
MCHC: 31.8 g/dL (ref 30.0–36.0)
MCV: 91.9 fL (ref 80.0–100.0)
Platelets: 267 10*3/uL (ref 150–400)
RBC: 5.06 MIL/uL (ref 4.22–5.81)
RDW: 15.4 % (ref 11.5–15.5)
WBC: 12.2 10*3/uL — ABNORMAL HIGH (ref 4.0–10.5)
nRBC: 0 % (ref 0.0–0.2)

## 2021-01-16 LAB — APTT: aPTT: 87 seconds — ABNORMAL HIGH (ref 24–36)

## 2021-01-16 LAB — MAGNESIUM: Magnesium: 2.2 mg/dL (ref 1.7–2.4)

## 2021-01-16 LAB — C4 COMPLEMENT: Complement C4, Body Fluid: 31 mg/dL (ref 12–38)

## 2021-01-16 MED ORDER — TAMSULOSIN HCL 0.4 MG PO CAPS
0.4000 mg | ORAL_CAPSULE | Freq: Every day | ORAL | Status: DC
  Administered 2021-01-16 – 2021-01-17 (×2): 0.4 mg via ORAL
  Filled 2021-01-16 (×2): qty 1

## 2021-01-16 MED ORDER — LABETALOL HCL 5 MG/ML IV SOLN
10.0000 mg | INTRAVENOUS | Status: DC | PRN
  Administered 2021-01-17: 10 mg via INTRAVENOUS

## 2021-01-16 MED ORDER — FUROSEMIDE 40 MG PO TABS
40.0000 mg | ORAL_TABLET | Freq: Every day | ORAL | Status: DC
  Administered 2021-01-16 – 2021-01-17 (×2): 40 mg via ORAL
  Filled 2021-01-16 (×2): qty 1

## 2021-01-16 MED ORDER — CARVEDILOL 3.125 MG PO TABS
3.1250 mg | ORAL_TABLET | Freq: Two times a day (BID) | ORAL | Status: DC
  Administered 2021-01-16 – 2021-01-17 (×3): 3.125 mg via ORAL
  Filled 2021-01-16 (×4): qty 1

## 2021-01-16 MED ORDER — APIXABAN 5 MG PO TABS
5.0000 mg | ORAL_TABLET | Freq: Two times a day (BID) | ORAL | Status: DC
  Administered 2021-01-16 – 2021-01-17 (×3): 5 mg via ORAL
  Filled 2021-01-16 (×3): qty 1

## 2021-01-16 MED ORDER — FLUTICASONE PROPIONATE 50 MCG/ACT NA SUSP
2.0000 | Freq: Every day | NASAL | Status: DC | PRN
  Filled 2021-01-16: qty 16

## 2021-01-16 MED ORDER — ATORVASTATIN CALCIUM 10 MG PO TABS
10.0000 mg | ORAL_TABLET | Freq: Every day | ORAL | Status: DC
  Administered 2021-01-16 – 2021-01-17 (×2): 10 mg via ORAL
  Filled 2021-01-16 (×2): qty 1

## 2021-01-16 MED ORDER — HYDRALAZINE HCL 50 MG PO TABS
50.0000 mg | ORAL_TABLET | Freq: Three times a day (TID) | ORAL | Status: DC
  Administered 2021-01-16 – 2021-01-17 (×5): 50 mg via ORAL
  Filled 2021-01-16 (×5): qty 1

## 2021-01-16 MED ORDER — LORATADINE 10 MG PO TABS
10.0000 mg | ORAL_TABLET | Freq: Every day | ORAL | Status: DC
  Administered 2021-01-16 – 2021-01-17 (×2): 10 mg via ORAL
  Filled 2021-01-16 (×2): qty 1

## 2021-01-16 MED ORDER — PREDNISONE 20 MG PO TABS
40.0000 mg | ORAL_TABLET | Freq: Every day | ORAL | Status: DC
  Administered 2021-01-17: 40 mg via ORAL
  Filled 2021-01-16: qty 2

## 2021-01-16 MED ORDER — AMLODIPINE BESYLATE 10 MG PO TABS
10.0000 mg | ORAL_TABLET | Freq: Every day | ORAL | Status: DC
  Administered 2021-01-16 – 2021-01-17 (×2): 10 mg via ORAL
  Filled 2021-01-16 (×2): qty 1

## 2021-01-16 MED ORDER — APIXABAN 5 MG PO TABS: 5.0000 mg | ORAL_TABLET | Freq: Two times a day (BID) | ORAL | Status: DC

## 2021-01-16 MED ORDER — ISOSORBIDE DINITRATE 10 MG PO TABS
20.0000 mg | ORAL_TABLET | Freq: Three times a day (TID) | ORAL | Status: DC
  Administered 2021-01-16 – 2021-01-17 (×5): 20 mg via ORAL
  Filled 2021-01-16 (×5): qty 2

## 2021-01-16 NOTE — Progress Notes (Signed)
NAME:  Zachary Hawkins, MRN:  [id number], DOB:  11/19/1955, LOS: 1 ADMISSION DATE:  01/14/2021, CONSULTATION DATE:  01/16/21 REFERRING MD:  EDP, CHIEF COMPLAINT:  anaphylaxis   History of Present Illness:  Zachary Hawkins is a 65 y.o. M with PMH of cardiomyopathy, Afib on eliquis, and HFpEF, ACE-I angioedema 10/2020 and anaphylaxis secondary to black cherries requiring recent hospitalization and intubation and discharged home with steroid taper on 9/2.   He ate dinner and felt short of breath and as if his throat was closing up, so presented to the ED after giving himself an epi pen injection  In the ED, his vitals were stable, he was able to swallow his secretions and sip water.   He was given benadryl and solumedrol, given his history of airway compromise PCCM consulted for transfer to [facility] for possible ENT consult.  Pertinent  Medical History   has a past medical history of Cardiomyopathy (HCC) (06/2017), Chronic renal insufficiency, stage 3 (moderate) (HCC), Diastolic dysfunction (06/2017), Hypertension, Hypertensive urgency, and Obesity.   Significant Hospital Events: Including procedures, antibiotic start and stop dates in addition to other pertinent events   9/8 admit to [facility], hemodynamically stable  Interim History / Subjective:  Not back to normal, but throat and neck edema better. Wants to eat. Still mild sore throat with swallowing.  Objective   Blood pressure 140/85, pulse 76, temperature 99.4 F (37.4 C), temperature source Axillary, resp. rate (!) 21, height 6' (1.829 m), weight (!) 136.1 kg, SpO2 96 %.        Intake/Output Summary (Last 24 hours) at 01/16/2021 1040 Last data filed at 01/16/2021 0700 Gross per 24 hour  Intake 1167.2 ml  Output 1175 ml  Net -7.8 ml    Filed Weights   01/14/21 2237 01/15/21 0544 01/16/21 0600  Weight: 132 kg 135.2 kg (!) 136.1 kg   General: chronically ill appearing man sitting up in bed in NAD HEENT: [place]/AT, eyes anicteric Neck: some  redundant tissue, not indurated Neuro: Awake, alert, answering questions appropriately, moving all extremities. CV: S1S2, irreg rhythm, reg rate PULM:  Mildly truncated speech, breathing comfortably on 2L . GI: soft, NT, obese Extremities: mild ankle edema, no cyanosis  Skin: no rashes, warm & dry   Resolved Hospital Problem list     Assessment & Plan:   Shortness of breath and concern for angioedema vs anaphylaxis. This occurred on steroids and without an obvious precipitant, making anaphylaxis seem less likely unless this was food-related from a several hours prior ingestion. Normal C4 level suggests against C1 esterase deficiency. Pt reports eating chicken and onions prior to admission, nothing he has ever reacted to prior.  No severe edema and hypertensive since arrival. History of OSA P: -Transfer out of ICU today. Ok to start soft diet. -C1 esterase, tryptase, IgE pending -OP Allergy & Immunology referral (placed today). -Continue famotidine. Need to restart steroids in case this is angioedema with urticardia or histamine-mediasted angioedema as this look less like c1 esterase deficiency with normal C4. -resume claritin daily -Continue home pulmicort & brovana; can resume Advair at discharge.  History of allergic rhinitis -resume claritin, flonase   Chronic Atrial fibrillation, HFpEF, Cardiomyopathy, HL P: -resume Eliquis -resume PTA hydralazine, coreg, amlodipine, lasix  -resume  statin -tele monitoring  Acute respiratory failure with hypoxia -wean O2 off -OOB mobility    Best Practice (right click and "Reselect all SmartList Selections" daily)   Diet/type: dysphagia diet (see orders) DVT prophylaxis: DOAC GI prophylaxis: H2B  Lines: N/A Foley:  N/A Code Status:  full code Last date of multidisciplinary goals of care discussion [ ]   Labs   CBC: Recent Labs  Lab 01/13/21 0715 01/15/21 0311 01/16/21 0519  WBC 10.1 13.2* 12.2*  NEUTROABS 6.1  --   --    HGB 14.2 15.3 14.8  HCT 46.5 48.8 46.5  MCV 93.4 93.0 91.9  PLT 304 310 267     Basic Metabolic Panel: Recent Labs  Lab 01/13/21 0705 01/15/21 0311 01/16/21 0519  NA 138  --  140  K 3.8  --  3.6  CL 101  --  96*  CO2 30  --  31  GLUCOSE 125*  --  103*  BUN 18  --  16  CREATININE 1.12 1.05 1.04  CALCIUM 8.8*  --  9.3  MG  --   --  2.2    GFR: Estimated Creatinine Clearance: 103.9 mL/min (by C-G formula based on SCr of 1.04 mg/dL). Recent Labs  Lab 01/13/21 0715 01/15/21 0311 01/15/21 0708 01/16/21 0519  PROCALCITON  --   --  <0.10  --   WBC 10.1 13.2*  --  12.2*    03/18/21, DO 01/16/21 10:59 AM [facility]

## 2021-01-16 NOTE — Progress Notes (Signed)
Pt noted having several occurrences of bradycardia on the monitor, HR as low as the 40s. Pt noted asleep in the bed. Pt assessed, pt denies feeling CP or SOB at the current time. HR currently SR in 70s. BP 119/58. Will continue to monitor pt.

## 2021-01-16 NOTE — Progress Notes (Signed)
ANTICOAGULATION CONSULT NOTE   Pharmacy Consult for heparin  Indication: atrial fibrillation  Allergies  Allergen Reactions   Ace Inhibitors Anaphylaxis   Black Cherry Fruit Extract [name]] Anaphylaxis    Tongue   Grenadine Flavor [Flavoring Agent] Anaphylaxis    Patient Measurements: Height: 6' (182.9 cm) Weight: (!) 136.1 kg (300 lb 0.7 oz) IBW/kg (Calculated) : 77.6 Heparin Dosing Weight: 107kg  Vital Signs: Temp: 99.4 F (37.4 C) (09/09 0740) Temp Source: Axillary (09/09 0740) BP: 140/85 (09/09 0740) Pulse Rate: 76 (09/09 0740)  Labs: Recent Labs    01/13/21 0905 01/15/21 0311 01/15/21 1305 01/15/21 2130 01/16/21 0519  HGB  --  15.3  --   --  14.8  HCT  --  48.8  --   --  46.5  PLT  --  310  --   --  267  APTT  --   --  33 56* 87*  HEPARINUNFRC  --   --  0.97*  --  0.82*  CREATININE  --  1.05  --   --  1.04  TROPONINIHS 16  --   --   --   --      Estimated Creatinine Clearance: 103.9 mL/min (by C-G formula based on SCr of 1.04 mg/dL).   Medical History: Past Medical History:  Diagnosis Date   Cardiomyopathy (HCC) 06/2017   EF 40-45%   Chronic renal insufficiency, stage 3 (moderate) (HCC)    Diastolic dysfunction 06/2017   grade 2    Hypertension    Hypertensive urgency    Obesity      Assessment: 65 year old male with history of afib on apixaban prior to admit. Patient with recent angioedema and was admitted overnight for concern of relapse and currently is unable to take po meds. Will transition to heparin.   aPTT is therapeutic at 87 seconds, heparin level falsely elevated by recent DOAC use, CBC stable.  Goal of Therapy:  Heparin level 0.3-0.7 units/ml aPTT 66-102 seconds Monitor platelets by anticoagulation protocol: Yes   Plan:  Continue heparin 1900 units/h Daily heparin level and CBC   [name], PharmD, [place], [facility] Clinical Pharmacist [phone] Please check AMION for all [facility] numbers 01/16/2021

## 2021-01-17 DIAGNOSIS — J9611 Chronic respiratory failure with hypoxia: Secondary | ICD-10-CM | POA: Diagnosis not present

## 2021-01-17 DIAGNOSIS — T783XXA Angioneurotic edema, initial encounter: Secondary | ICD-10-CM | POA: Diagnosis not present

## 2021-01-17 LAB — CBC
HCT: 45.9 % (ref 39.0–52.0)
Hemoglobin: 14.3 g/dL (ref 13.0–17.0)
MCH: 28.8 pg (ref 26.0–34.0)
MCHC: 31.2 g/dL (ref 30.0–36.0)
MCV: 92.5 fL (ref 80.0–100.0)
Platelets: 280 10*3/uL (ref 150–400)
RBC: 4.96 MIL/uL (ref 4.22–5.81)
RDW: 15.1 % (ref 11.5–15.5)
WBC: 9.9 10*3/uL (ref 4.0–10.5)
nRBC: 0 % (ref 0.0–0.2)

## 2021-01-17 LAB — TRYPTASE: Tryptase: 5.5 ug/L (ref 2.2–13.2)

## 2021-01-17 MED ORDER — PREDNISONE 20 MG PO TABS: 40.0000 mg | ORAL_TABLET | Freq: Every day | ORAL | 0 refills | Status: DC

## 2021-01-17 MED ORDER — FAMOTIDINE 20 MG PO TABS: 20.0000 mg | ORAL_TABLET | Freq: Two times a day (BID) | ORAL | 1 refills | Status: DC

## 2021-01-17 MED ORDER — FAMOTIDINE 20 MG PO TABS: 20.0000 mg | ORAL_TABLET | Freq: Two times a day (BID) | ORAL | 0 refills | Status: AC

## 2021-01-17 NOTE — Evaluation (Signed)
Occupational Therapy Evaluation Patient Details Name: Zachary Hawkins MRN: [id number] DOB: [birth year] Today's Date: 01/17/2021    History of Present Illness The pt is a 65 yo male presenting 9/6 with SOB, feeling like his throat was closing up after eating dinner. Pt self-administered epi pen prior to arrival at ED. PMH includes: cardiomyopathy, renal insufficiency, HTN, and obesity.   Clinical Impression   Pt lives independently in a motel. He has a cane, but keeps it in his car. Pt presents with impulsivity, impaired safety and awareness of deficits with balance deficits during ambulation. Sp02 noted to drop to 79% with ambulation off 02, and rebound to 94% with 2L 02, rest break and pursed lip breathing. Pt requires hand held assist for ambulation and up to min assist for ADL. He reports his motel room is uninhabitable and plans to move to a different room. Will follow acutely.     Follow Up Recommendations  Home health OT    Equipment Recommendations  Tub seat   Recommendations for Other Services       Precautions / Restrictions Precautions Precautions: Fall Precaution Comments: watch O2 Restrictions Weight Bearing Restrictions: No      Mobility Bed Mobility Overal bed mobility: Needs Assistance Bed Mobility: Supine to Sit     Supine to sit: Min guard;HOB elevated     General bed mobility comments: minG for line management, pt able to complete without any physical assist    Transfers Overall transfer level: Needs assistance Equipment used: None Transfers: Sit to/from Stand Sit to Stand: Min guard        General transfer comment: minG for safety, pt able to rise and steady without assist    Balance Overall balance assessment: Mild deficits observed, not formally tested Sitting-balance support: No upper extremity supported Sitting balance-Leahy Scale: Good     Standing balance support: Single extremity supported Standing balance-Leahy Scale: Fair                              ADL either performed or assessed with clinical judgement   ADL Overall ADL's : Needs assistance/impaired Eating/Feeding: Independent;Sitting   Grooming: Wash/dry hands;Standing;Min guard   Upper Body Bathing: Set up;Cueing for UE precautions   Lower Body Bathing: Min guard;Sit to/from stand   Upper Body Dressing : Minimal assistance;Standing   Lower Body Dressing: Set up;Sitting/lateral leans Lower Body Dressing Details (indicate cue type and reason): socks Toilet Transfer: Ambulation;Minimal assistance Toilet Transfer Details (indicate cue type and reason): stood to urinate Toileting- Clothing Manipulation and Hygiene: Min guard       Functional mobility during ADLs: Minimal assistance (one hand held)       Vision Patient Visual Report: No change from baseline       Perception     Praxis      Pertinent Vitals/Pain Pain Assessment: No/denies pain Faces Pain Scale: No hurt Pain Intervention(s): Monitored during session;Limited activity within patient's tolerance;Repositioned     Hand Dominance Right   Extremity/Trunk Assessment Upper Extremity Assessment Upper Extremity Assessment: Overall WFL for tasks assessed   Lower Extremity Assessment Lower Extremity Assessment: Defer to PT evaluation   Cervical / Trunk Assessment Cervical / Trunk Assessment: Normal Cervical / Trunk Exceptions: large body habitus   Communication Communication Communication: No difficulties (impacted by SOB)   Cognition Arousal/Alertness: Awake/alert Behavior During Therapy: Impulsive Overall Cognitive Status: Impaired/Different from baseline Area of Impairment: Safety/judgement;Problem solving  Safety/Judgement: Decreased awareness of safety;Decreased awareness of deficits   Problem Solving: Slow processing General Comments: decreased awareness of lines   General Comments  Pt on 2L at rest, unable to tolerate  drop to RA (SpO2 to 86% on RA at rest and 89% on 1L). Pt with drop to 79% on RA with ambulation, returned to 94% on 2L    Exercises     Shoulder Instructions      Home Living Family/patient expects to be discharged to:: Other (Comment) (motel) Living Arrangements: Alone Available Help at Discharge: Family;Friend(s);Available PRN/intermittently Type of Home: Other(Comment) (motel) Home Access: Level entry     Home Layout: One level     Bathroom Shower/Tub: Chief Strategy Officer: Standard     Home Equipment: Cane - single point   Additional Comments: pt states current hotel room is unsafe living environment, looking for new place to stay      Prior Functioning/Environment Level of Independence: Independent        Comments: enjoys spending time with his 5 kids, 16 grandkids and 4 great grandkids        OT Problem List: Decreased activity tolerance;Impaired balance (sitting and/or standing);Decreased cognition;Decreased safety awareness;Obesity      OT Treatment/Interventions: Self-care/ADL training;Energy conservation;Therapeutic activities;Patient/family education;Balance training    OT Goals(Current goals can be found in the care plan section) Acute Rehab OT Goals Patient Stated Goal: to stop smoking OT Goal Formulation: With patient Time For Goal Achievement: 01/31/21 Potential to Achieve Goals: Good ADL Goals Pt Will Perform Grooming: with modified independence;standing Pt Will Transfer to Toilet: with modified independence;ambulating;regular height toilet Pt Will Perform Toileting - Clothing Manipulation and hygiene: with modified independence;sit to/from stand Pt Will Perform Tub/Shower Transfer: Tub transfer;with modified independence;shower seat Additional ADL Goal #1: Pt will monitor pace of activity and initiate rest breaks appropriately during ADL and mobility. Additional ADL Goal #2: Pt will gather items necessary for ADL around his room  modified independently.  OT Frequency: Min 2X/week   Barriers to D/C:            Co-evaluation PT/OT/SLP Co-Evaluation/Treatment: Yes Reason for Co-Treatment: For patient/therapist safety PT goals addressed during session: Mobility/safety with mobility;Balance;Proper use of DME OT goals addressed during session: ADL's and self-care      AM-PAC OT "6 Clicks" Daily Activity     Outcome Measure Help from another person eating meals?: A Little Help from another person taking care of personal grooming?: A Little Help from another person toileting, which includes using toliet, bedpan, or urinal?: A Little Help from another person bathing (including washing, rinsing, drying)?: A Little Help from another person to put on and taking off regular upper body clothing?: A Little Help from another person to put on and taking off regular lower body clothing?: A Little 6 Click Score: 18   End of Session Equipment Utilized During Treatment: Gait belt;Oxygen (2L) Nurse Communication: Mobility status;Other (comment) (02 requirements)  Activity Tolerance: Patient tolerated treatment well Patient left: in chair;with call bell/phone within reach  OT Visit Diagnosis: Unsteadiness on feet (R26.81);Other abnormalities of gait and mobility (R26.89);Other symptoms and signs involving cognitive function                Time: 2505-3976 OT Time Calculation (min): 31 min Charges:  OT General Charges $OT Visit: 1 Visit OT Evaluation $OT Eval Moderate Complexity: 1 Mod  Martie Round, OTR/L Acute Rehabilitation Services Pager: [phone] Office: [phone]  Evern Bio 01/17/2021, 12:36 PM

## 2021-01-17 NOTE — TOC Transition Note (Signed)
Transition of Care [facility]) - CM/SW Discharge Note   Patient Details  Name: DAMEER SPEISER MRN: [id number] Date of Birth: July 22, 1955  Transition of Care [facility]) CM/SW Contact:  Lawerance Sabal, RN Phone Number: 01/17/2021, 2:23 PM   Clinical Narrative:    Patient states he will DC to  [facility]  [address]  Oxygen for home ordered through [employer] who will deliver tanks to room for use in transport and until home concentrator is set up at home.  Patient states he has money for cab and will self pay if he is unable to get family to pick him up.  No other TOC needs identified.     Final next level of care: Home/Self Care Barriers to Discharge: No Barriers Identified   Patient Goals and CMS Choice        Discharge Placement                       Discharge Plan and Services                DME Arranged: Oxygen DME Agency: [employer] Date DME Agency Contacted: 01/17/21 Time DME Agency Contacted: 1423 Representative spoke with at DME Agency: Vaughan Basta HH Arranged: NA          Social Determinants of Health (SDOH) Interventions     Readmission Risk Interventions No flowsheet data found.

## 2021-01-17 NOTE — Discharge Summary (Signed)
Physician Discharge Summary         Patient ID: Zachary Hawkins MRN: [id number] DOB/AGE: 12-Jan-1956 65 y.o.  Admit date: 01/14/2021 Discharge date: 01/17/2021  Discharge Diagnoses:    Angioedema Hypoxic respiratory failure Chronic diastolic heart failure Essential Hypertension Cardiomyopathy Atrial fibrillation on chronic anticoagulation Chronic renal insufficiency, stage 3 Morbid obesity   Allergic rhinitis    Discharge summary    Zachary Hawkins is a 65 year old male with past medical history of tobacco abuse, cardiomyopathy, Afib on eliquis, and HFpEF, ACE-I angioedema 10/2020 and anaphylaxis secondary to black cherries requiring recent hospitalization and intubation who was discharged home with steroid taper on 9/2.   On 9/7, he ate dinner (chicken and onions) and felt short of breath and as if his throat was closing up (while still on steroid taper).  He reports no prior reaction to eating these foods.  Therefore he presented to the [facility] ED after giving himself an epi pen injection.  In the ED, his vitals were stable, he was able to swallow his secretions and sip water.   He was given benadryl and solumedrol, given his history of airway compromise PCCM consulted for transfer to [facility] for possible ENT consult.  He was admitted to the ICU under PCCM service for close monitoring for concern anaphylaxis versus angioedema.  It was felt to be less likely anaphylaxis given the absence of hypotension.  He was continued on steroids and antihistamines.  His high blood pressure was managed with IV antihypertensives till he was able to be resumed on his oral home meds.  He was transitioned to heparin gtt while NPO and unable to take his home apixaban.  Labs noted for normal C4 suggesting against C1 esterase deficiency. Tryptase negative, C1 Esterase Inhibitor slightly elevated at 52, IgE pending.  His Tmax was 99.8, WBC max at 13.2 since resolved, and PCT negative which suggest against  bacterial process.  He was transitioned and tolerated an oral diet.   He is to be discharged home with referral to allergy and immunology for further workup of recurrent angioedema with epi pen, steroid taper, and ongoing antihistamines.  During hospitalization, he did require ongoing oxygen use.  Ambulatory walk test on 9/10, patient desaturated to 79% on room air, improved to 94% on 2L [place].  Patient also has follow up outpatient appointment with pulmonology as below.   Discharge Plan by Active Problems    Angioedema, recurrent, unclear etiology  - epi pen prn at home - avoid known/ suspected allergies until outpatient evaluation by Allergy & Immunology (referral placed on 9/9) - continue steroid taper- prescription sent to [facility] - continue famotidine -prescription sent to [facility] - pending IgE   OSA Hypoxic respiratory failure -Continue on home Oxygen 2L (ordered, new this admission). Reassess at outpatient follow up. -  pulmonary outpatient f/u with Dr. Thora Lance on 01/23/2021 at 0940 - continue home advair     History of allergic rhinitis -resume claritin, flonase     Chronic Atrial fibrillation, HFpEF, Cardiomyopathy, HL P: - Continue home Eliquis 5mg  BID, hydralazine 50mg  TID, coreg 3.125mg  BID, amlodipine 10 mg daily, lasix 40mg  daily, lipitor 10mg  daily  - followup outpt with Dr. on 02/13/2021 at 0940  - heart healthy diet and monitor home weights  -avoid illicit drugs (has been cocaine positive in past admissions)   Morbid Obesity, BMI 40.69 kg/m2 - would benefit from weight loss therapy outpatient    Significant Hospital tests/ studies  C4 31 C1INH 52  IgE pending Tryptase 5.5  Procedures    Culture data/antimicrobials   9/8 SARS neg 9/8 MRSA neg   Consults  PT/ OT    Discharge Exam: BP 134/83   Pulse 78   Temp 98.5 F (36.9 C) (Axillary)   Resp (!) 22   Ht 6' (1.829 m)   Wt (!) 136.1 kg   SpO2 92%   BMI 40.69 kg/m   General:  morbidly  obese man sitting up in the chair in NAD HEENT: [place]/AT, eyes anicteric Neck: no ongoing edema Neuro: awake, alert, answering questions appropriately, moving all extremities. Normal gait. CV: s1s2, RRR, no m/r/g PULM:  mild tachypnea, no accessory muscle use, CTAB GI: obese, soft, bsx4 active  Extremities: warm/dry, no peripheral edema  Skin: no rashes or lesions, warm & dry   Labs at discharge   Lab Results  Component Value Date   CREATININE 1.04 01/16/2021   BUN 16 01/16/2021   NA 140 01/16/2021   K 3.6 01/16/2021   CL 96 (L) 01/16/2021   CO2 31 01/16/2021   Lab Results  Component Value Date   WBC 9.9 01/17/2021   HGB 14.3 01/17/2021   HCT 45.9 01/17/2021   MCV 92.5 01/17/2021   PLT 280 01/17/2021   Lab Results  Component Value Date   ALT 25 10/17/2020   AST 22 10/17/2020   ALKPHOS 70 10/17/2020   BILITOT 0.6 10/17/2020   Lab Results  Component Value Date   INR 1.0 12/16/2020   INR 1.1 08/02/2019    Current radiological studies    No results found.  Disposition:  Home- currently living at [facility] on [address], [address]   There are no questions and answers to display.       Discharge Instructions     Ambulatory referral to Immunology   Complete by: As directed    Recurrent angioedema       Allergies as of 01/17/2021       Reactions   Ace Inhibitors Anaphylaxis   Black Cherry Fruit Extract [name]] Anaphylaxis   Tongue   Grenadine Flavor [flavoring Agent] Anaphylaxis        Medication List     TAKE these medications    Advair HFA 115-21 MCG/ACT inhaler Generic drug: fluticasone-salmeterol INHALE 2 PUFFS INTO THE LUNGS 2 (TWO) TIMES DAILY.   albuterol 108 (90 Base) MCG/ACT inhaler Commonly known as: VENTOLIN HFA Inhale 2 puffs into the lungs every 4 (four) hours as needed for wheezing or shortness of breath.   amLODipine 10 MG tablet Commonly known as: NORVASC TAKE 1 TABLET (10 MG TOTAL) BY MOUTH DAILY. What  changed: how much to take   apixaban 5 MG Tabs tablet Commonly known as: ELIQUIS Take 1 tablet (5 mg total) by mouth 2 (two) times daily.   atorvastatin 10 MG tablet Commonly known as: LIPITOR TAKE 1 TABLET (10 MG TOTAL) BY MOUTH DAILY. What changed: how much to take   carvedilol 3.125 MG tablet Commonly known as: COREG TAKE 1 TABLET (3.125 MG TOTAL) BY MOUTH 2 (TWO) TIMES DAILY WITH A MEAL. What changed: how much to take   EPINEPHrine 0.3 mg/0.3 mL Soaj injection Commonly known as: EPI-PEN Inject 0.3 mg into the muscle as needed for anaphylaxis.   famotidine 20 MG tablet Commonly known as: PEPCID Take 1 tablet (20 mg total) by mouth 2 (two) times daily.   fluticasone 50 MCG/ACT nasal spray Commonly known as: FLONASE PLACE 2 SPRAYS INTO BOTH NOSTRILS  DAILY. What changed:  when to take this reasons to take this   furosemide 40 MG tablet Commonly known as: LASIX TAKE 1 TABLET (40 MG TOTAL) BY MOUTH DAILY. What changed: how much to take   hydrALAZINE 50 MG tablet Commonly known as: APRESOLINE TAKE 1 TABLET (50 MG TOTAL) BY MOUTH 3 (THREE) TIMES DAILY. What changed: how much to take   isosorbide dinitrate 20 MG tablet Commonly known as: ISORDIL TAKE 1 TABLET (20 MG TOTAL) BY MOUTH 3 (THREE) TIMES DAILY. SCHEDULE OFFICE VISIT What changed:  how much to take how to take this when to take this   loratadine 10 MG tablet Commonly known as: CLARITIN Take 1 tablet (10 mg total) by mouth daily.   multivitamin with minerals Tabs tablet Take 1 tablet by mouth daily.   polyethylene glycol 17 g packet Commonly known as: MIRALAX / GLYCOLAX Take 17 g by mouth daily as needed for moderate constipation.   predniSONE 20 MG tablet Commonly known as: DELTASONE Take 2 tablets (40 mg total) by mouth daily with breakfast. Take 40mg  ( 2 tabs) daily for 5 days,  then 20mg  (1 tab) daily for 5 days,  then 10mg  (half tab) daily for 5 days. Start taking on: January 18, 2021 What  changed:  how much to take how to take this when to take this additional instructions   sodium chloride 0.65 % Soln nasal spray Commonly known as: OCEAN Place 2 sprays into both nostrils 3 (three) times daily. What changed:  when to take this reasons to take this   tamsulosin 0.4 MG Caps capsule Commonly known as: FLOMAX Take 1 capsule (0.4 mg total) by mouth daily after supper.               Durable Medical Equipment  (From admission, onward)           Start     Ordered   01/17/21 1341  For home use only DME oxygen  Once       Question Answer Comment  Length of Need 6 Months   Mode or (Route) Nasal cannula   Liters per Minute 2   Frequency Continuous (stationary and portable oxygen unit needed)   Oxygen conserving device No   Oxygen delivery system Gas      01/17/21 1341   01/17/21 1048  For home use only DME oxygen  Once       Question Answer Comment  Length of Need 6 Months   Mode or (Route) Nasal cannula   Frequency Continuous (stationary and portable oxygen unit needed)   Oxygen delivery system Gas      01/17/21 1048             Follow-up appointment   Pulmonary >> Dr. 03/19/21 on 01/23/2021 at 0940 Cardiology >> Dr. 03/19/21 on 02/13/2021 at 0940  Has been referred to [facility] Allergy & Immunology   Discharge Condition:    stable  Physician Statement:   The Patient was personally examined, the discharge assessment and plan has been personally reviewed and I agree with ACNP Babcock's assessment and plan. 35 minutes of time have been dedicated to discharge assessment, planning and discharge instructions.   Signed:  Royann Shivers, DO 01/17/21 3:11 PM [facility]

## 2021-01-17 NOTE — Evaluation (Signed)
Physical Therapy Evaluation Patient Details Name: Zachary Hawkins MRN: [id number] DOB: Jun 24, 1955 Today's Date: 01/17/2021   History of Present Illness  The pt is a 65 yo male presenting 9/6 with SOB, feeling like his throat was closing up after eating dinner. Pt self-administered epi pen prior to arrival at ED. PMH includes: cardiomyopathy, renal insufficiency, HTN, and obesity.   Clinical Impression  Pt in bed upon arrival of PT, agreeable to evaluation at this time. Prior to admission the pt was living alone, states he was completely independent with mobility and ADLs since last recent admission. The pt now presents with limitations in functional mobility, dynamic stability, and activity tolerance due to above dx, and will continue to benefit from skilled PT to address these deficits. The pt was able to complete bed mobility and initial sit-stand transfers without physical assist, but does benefit from minG at this time for standing balance. He was able to complete hallway ambulation with minG-minA for balance, and +2 assist for managing O2 needs. The pt required 2L O2 at rest to maintain SpO2 > 90%, and was able to maintain >90% on 2L with ambulation, will likely benefit from home O2 for improved safety with mobility, and will benefit from continued skilled PT acutely to further progress dynamic stability and activity tolerance.   SATURATION QUALIFICATIONS: (This note is used to comply with regulatory documentation for home oxygen)  Patient Saturations on Room Air at Rest = 86%  Patient Saturations on Room Air while Ambulating = 79%  Patient Saturations on 2 Liters of oxygen while Ambulating = 94%  Please briefly explain why patient needs home oxygen: Pt unable to maintain SpO2 > 90% while on RA at rest or with mobility.      Follow Up Recommendations No PT follow up    Equipment Recommendations  None recommended by PT    Recommendations for Other Services       Precautions /  Restrictions Precautions Precautions: Fall Precaution Comments: watch O2 Restrictions Weight Bearing Restrictions: No      Mobility  Bed Mobility Overal bed mobility: Needs Assistance Bed Mobility: Supine to Sit     Supine to sit: Min guard;HOB elevated     General bed mobility comments: minG for line management, pt able to complete without any physical assist    Transfers Overall transfer level: Needs assistance Equipment used: None Transfers: Sit to/from [employer] Sit to Stand: Min guard Stand pivot transfers: Min guard       General transfer comment: minG for safety, pt able to rise and steady without assist. SpO2 to 86% on RA  Ambulation/Gait Ambulation/Gait assistance: Min assist;+2 safety/equipment (for O2 management) Gait Distance (Feet): 250 Feet Assistive device: 1 person hand held assist Gait Pattern/deviations: Step-through pattern;Decreased stride length;Steppage;Narrow base of support;Drifts right/left;Scissoring Gait velocity: decreased   General Gait Details: pt with narrow BOS and intermittent scissoring, needing minA to steady intermittently. Pt attempting ambulation on RA, SpO2 dropped to 79%, improved to 90s with 2L      Balance Overall balance assessment: Mild deficits observed, not formally tested Sitting-balance support: No upper extremity supported Sitting balance-Leahy Scale: Good     Standing balance support: Single extremity supported Standing balance-Leahy Scale: Fair                               Pertinent Vitals/Pain Pain Assessment: No/denies pain Faces Pain Scale: No hurt Pain Intervention(s): Monitored during session;Limited activity  within patient's tolerance;Repositioned    Home Living Family/patient expects to be discharged to:: Other (Comment) (hotel) Living Arrangements: Alone Available Help at Discharge: Family Type of Home: Other(Comment) (hotel room) Home Access: Level entry      Home Layout: One level Home Equipment: Cane - single point Additional Comments: pt states current hotel room is unsafe living environment, looking for new place to stay    Prior Function Level of Independence: Independent         Comments: enjoys spending time with his 5 kids, 16 grandkids and 4 great grandkids     Hand Dominance   Dominant Hand: Right    Extremity/Trunk Assessment   Upper Extremity Assessment Upper Extremity Assessment: Defer to OT evaluation    Lower Extremity Assessment Lower Extremity Assessment: Overall WFL for tasks assessed    Cervical / Trunk Assessment Cervical / Trunk Assessment: Normal Cervical / Trunk Exceptions: large body habitus  Communication   Communication: No difficulties (impacted by SOB)  Cognition Arousal/Alertness: Awake/alert Behavior During Therapy: WFL for tasks assessed/performed Overall Cognitive Status: Impaired/Different from baseline Area of Impairment: Safety/judgement;Problem solving                         Safety/Judgement: Decreased awareness of safety;Decreased awareness of deficits   Problem Solving: Slow processing General Comments: pt with slightly decreased insight to deficits, and slightly increased time to answer questions regarding PLOF. improved through session      General Comments General comments (skin integrity, edema, etc.): Pt on 2L at rest, unable to tolerate drop to RA (SpO2 to 86% on RA at rest and 89% on 1L). Pt with drop to 79% on RA with ambulation, returned to 94% on 2L    Exercises     Assessment/Plan    PT Assessment Patient needs continued PT services  PT Problem List Decreased strength;Decreased activity tolerance;Decreased balance;Decreased mobility;Cardiopulmonary status limiting activity       PT Treatment Interventions Gait training;Stair training;Functional mobility training;Therapeutic activities;Patient/family education    PT Goals (Current goals can be found in  the Care Plan section)  Acute Rehab PT Goals Patient Stated Goal: to stop smoking PT Goal Formulation: With patient Time For Goal Achievement: 01/31/21 Potential to Achieve Goals: Good    Frequency Min 3X/week   Barriers to discharge        Co-evaluation PT/OT/SLP Co-Evaluation/Treatment: Yes Reason for Co-Treatment: For patient/therapist safety;To address functional/ADL transfers PT goals addressed during session: Mobility/safety with mobility;Balance;Proper use of DME         AM-PAC PT "6 Clicks" Mobility  Outcome Measure Help needed turning from your back to your side while in a flat bed without using bedrails?: None Help needed moving from lying on your back to sitting on the side of a flat bed without using bedrails?: A Little Help needed moving to and from a bed to a chair (including a wheelchair)?: A Little Help needed standing up from a chair using your arms (e.g., wheelchair or bedside chair)?: A Little Help needed to walk in hospital room?: A Little Help needed climbing 3-5 steps with a railing? : A Little 6 Click Score: 19    End of Session Equipment Utilized During Treatment: Gait belt;Oxygen Activity Tolerance: Patient tolerated treatment well;Patient limited by fatigue Patient left: with call bell/phone within reach;in chair Nurse Communication: Mobility status PT Visit Diagnosis: Unsteadiness on feet (R26.81);Other abnormalities of gait and mobility (R26.89);Difficulty in walking, not elsewhere classified (R26.2)    Time:  4035-2481 PT Time Calculation (min) (ACUTE ONLY): 32 min   Charges:   PT Evaluation $PT Eval Moderate Complexity: 1 Mod          Vickki Muff, PT, DPT   Acute Rehabilitation Department Pager #: [phone]  Ronnie Derby 01/17/2021, 12:09 PM

## 2021-01-17 NOTE — Progress Notes (Signed)
SATURATION QUALIFICATIONS: (This note is used to comply with regulatory documentation for home oxygen)  Patient Saturations on Room Air at Rest = 86%  Patient Saturations on Room Air while Ambulating = 79%  Patient Saturations on 2 Liters of oxygen while Ambulating = 94%  Please briefly explain why patient needs home oxygen: Pt unable to maintain SpO2 > 90% while on RA at rest or with mobility.   Vickki Muff, PT, DPT   Acute Rehabilitation Department Pager #: [phone]

## 2021-01-21 LAB — IGE: IgE (Immunoglobulin E), Serum: 394 IU/mL (ref 6–495)

## 2021-01-22 NOTE — Progress Notes (Signed)
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 He was referred to allergy/immunology.   He has some DOE. Taking advair one puff BID. No cough especially since stopping smoking.   He never had any itching during either of the above episodes. Tryptase was negative.   He is finishing course of steroids.   Snores. Some PND. Some daytime sleepiness.   He had an episode yesterday where home oxygen cut off and it worried him.   Otherwise pertinent review of systems is negative.  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    Past Medical History:  Diagnosis Date   Cardiomyopathy (HCC) 06/2017   EF 40-45%   Chronic renal insufficiency, stage 3 (moderate) (HCC)    Diastolic dysfunction 06/2017   grade 2    Hypertension    Hypertensive urgency    Obesity      Family History  Problem Relation Age of Onset   Diabetes Mother    Diabetes Father      No past surgical history on file.  Social History   Socioeconomic History   Marital status: Single    Spouse name: Not on file   Number of children: Not on file   Years of education: Not on file   Highest education level: Not on file  Occupational History   Not on file  Tobacco Use    Smoking status: Every Day    Packs/day: 1.00    Types: Cigarettes   Smokeless tobacco: Never   Tobacco comments:    1 pack last 2-3 days- states stopped 01/04/21.  Substance and Sexual Activity   Alcohol use: Yes    Alcohol/week: 1.0 standard drink    Types: 1 Shots of liquor per week    Comment: socially   Drug use: Yes    Types: Marijuana    Comment: every other week   Sexual activity: Not on file  Other Topics Concern   Not on file  Social History Narrative   Not on file   Social Determinants of Health   Financial Resource Strain: Not on file  Food Insecurity: Not on file  Transportation Needs: Not on file  Physical Activity: Not on file  Stress: Not on file  Social Connections: Not on file  Intimate Partner Violence: Not on file     Allergies  Allergen Reactions   Ace Inhibitors Anaphylaxis   Black Cherry Fruit Extract [name]] Anaphylaxis    Tongue   Grenadine Flavor [Flavoring Agent] Anaphylaxis     Outpatient Medications Prior to Visit  Medication Sig Dispense Refill   ADVAIR HFA 115-21 MCG/ACT inhaler INHALE 2 PUFFS INTO THE LUNGS 2 (TWO) TIMES DAILY. 36 g 12   albuterol (VENTOLIN HFA) 108 (90 Base) MCG/ACT inhaler Inhale 2  puffs into the lungs every 4 (four) hours as needed for wheezing or shortness of breath. 18 g 1   amLODipine (NORVASC) 10 MG tablet TAKE 1 TABLET (10 MG TOTAL) BY MOUTH DAILY. (Patient taking differently: Take 10 mg by mouth daily.) 90 tablet 3   apixaban (ELIQUIS) 5 MG TABS tablet Take 1 tablet (5 mg total) by mouth 2 (two) times daily. 60 tablet 2   atorvastatin (LIPITOR) 10 MG tablet TAKE 1 TABLET (10 MG TOTAL) BY MOUTH DAILY. (Patient taking differently: Take 10 mg by mouth daily.) 90 tablet 3   carvedilol (COREG) 3.125 MG tablet TAKE 1 TABLET (3.125 MG TOTAL) BY MOUTH 2 (TWO) TIMES DAILY WITH A MEAL. (Patient taking differently: Take 3.125 mg by mouth 2 (two) times daily with a meal.) 180 tablet 3   EPINEPHrine 0.3 mg/0.3 mL IJ  SOAJ injection Inject 0.3 mg into the muscle as needed for anaphylaxis. 1 each 1   famotidine (PEPCID) 20 MG tablet Take 1 tablet (20 mg total) by mouth 2 (two) times daily. 28 tablet 0   fluticasone (FLONASE) 50 MCG/ACT nasal spray PLACE 2 SPRAYS INTO BOTH NOSTRILS DAILY. (Patient taking differently: Place 2 sprays into both nostrils daily as needed for allergies.) 16 g 6   furosemide (LASIX) 40 MG tablet TAKE 1 TABLET (40 MG TOTAL) BY MOUTH DAILY. (Patient taking differently: Take 40 mg by mouth daily.) 90 tablet 3   hydrALAZINE (APRESOLINE) 50 MG tablet TAKE 1 TABLET (50 MG TOTAL) BY MOUTH 3 (THREE) TIMES DAILY. (Patient taking differently: Take 50 mg by mouth 3 (three) times daily.) 270 tablet 3   isosorbide dinitrate (ISORDIL) 20 MG tablet TAKE 1 TABLET (20 MG TOTAL) BY MOUTH 3 (THREE) TIMES DAILY. SCHEDULE OFFICE VISIT (Patient taking differently: Take 20 mg by mouth 3 (three) times daily.) 270 tablet 1   loratadine (CLARITIN) 10 MG tablet Take 1 tablet (10 mg total) by mouth daily. (Patient not taking: Reported on 01/17/2021) 30 tablet 0   Multiple Vitamin (MULTIVITAMIN WITH MINERALS) TABS tablet Take 1 tablet by mouth daily.     polyethylene glycol (MIRALAX / GLYCOLAX) 17 g packet Take 17 g by mouth daily as needed for moderate constipation. 14 each 0   predniSONE (DELTASONE) 20 MG tablet Take 2 tablets (40 mg total) by mouth daily with breakfast. Take 40mg  ( 2 tabs) daily for 5 days, then 20mg  (1 tab) daily for 5 days, then 10mg  (half tab) daily for 5 days. 20 tablet 0   sodium chloride (OCEAN) 0.65 % SOLN nasal spray Place 2 sprays into both nostrils 3 (three) times daily. (Patient taking differently: Place 2 sprays into both nostrils as needed for congestion.) 100 mL 0   tamsulosin (FLOMAX) 0.4 MG CAPS capsule Take 1 capsule (0.4 mg total) by mouth daily after supper. 30 capsule 0   No facility-administered medications prior to visit.       Objective:   Physical Exam:  General  appearance: 65 y.o., male, NAD, conversant  Eyes: anicteric sclerae, moist conjunctivae; no lid-lag; PERRL, tracking appropriately HENT: NCAT; oropharynx, MMM, no mucosal ulcerations; normal hard and soft palate Neck: Trachea midline; no lymphadenopathy, no JVD Lungs: rhonchi bilaterally, no crackles, no wheeze, with normal respiratory effort CV: RRR, no MRGs  Abdomen: Soft, non-tender; obese non-distended, BS present  Extremities: No peripheral edema, radial and DP pulses present bilaterally  Skin: Normal temperature, turgor and texture; no rash Psych: Appropriate affect Neuro: Alert and oriented to person and place, no focal deficit  Vitals:   01/23/21 0939  BP: 130/80  Pulse: 92  SpO2: 98%  Weight: (!) 308 lb (139.7 kg)  Height: 6\' 1"  (1.854 m)   98% on RA BMI Readings from Last 3 Encounters:  01/23/21 40.64 kg/m  01/16/21 40.69 kg/m  01/13/21 39.47 kg/m   Wt Readings from Last 3 Encounters:  01/23/21 (!) 308 lb (139.7 kg)  01/16/21 (!) 300 lb 0.7 oz (136.1 kg)  01/13/21 291 lb 0.1 oz (132 kg)     CBC    Component Value Date/Time   WBC 9.9 01/17/2021 0041   RBC 4.96 01/17/2021 0041   HGB 14.3 01/17/2021 0041   HGB 16.7 07/11/2020 1103   HCT 45.9 01/17/2021 0041   HCT 50.3 07/11/2020 1103   PLT 280 01/17/2021 0041   PLT 232 07/11/2020 1103   MCV 92.5 01/17/2021 0041   MCV 88 07/11/2020 1103   MCH 28.8 01/17/2021 0041   MCHC 31.2 01/17/2021 0041   RDW 15.1 01/17/2021 0041   RDW 13.6 07/11/2020 1103   LYMPHSABS 2.5 01/13/2021 0715   MONOABS 0.9 01/13/2021 0715   EOSABS 0.2 01/13/2021 0715   BASOSABS 0.1 01/13/2021 0715   C4 31 IgE 394 C1 inh above normal  Tryptase WNL  Chest Imaging: CXR 01/13/21 reviewed by me and unremarkable  Pulmonary Functions Testing Results: No flowsheet data found.    Echocardiogram:   TTE 01/08/21 EF 60-65%, G1DD     Assessment & Plan:   # Angioedema: possible that it is still related to ACEi even years from taking  it. Not sure what to make of the elevated C1inh level.  # Dyspnea on exertion: May be multifactorial from deconditioning, diastolic dysfunction (but looks well compensated today), possible asthma/copd.   # Snoring  # Smoking: Congratulated him on cessation. Does not qualify for lung cancer screening with <20 py smoking.  Plan: - PFTs - hold advair for 3 days before PFTs - instructed him to rinse mouth out after using advair - weight loss encouraged - home sleep study, will see if there's anything we can do to help him afford a CPAP if he qualifies - no need for home O2, did not desaturate with exertion today - follow up with allergy for angioedema      03/10/21, MD [facility] 01/23/2021 9:43 AM

## 2021-01-23 ENCOUNTER — Ambulatory Visit (INDEPENDENT_AMBULATORY_CARE_PROVIDER_SITE_OTHER): Payer: Medicaid Other | Admitting: Student

## 2021-01-23 ENCOUNTER — Encounter: Payer: Self-pay | Admitting: Student

## 2021-01-23 ENCOUNTER — Other Ambulatory Visit: Payer: Self-pay

## 2021-01-23 DIAGNOSIS — R0600 Dyspnea, unspecified: Secondary | ICD-10-CM | POA: Diagnosis not present

## 2021-01-23 DIAGNOSIS — R0683 Snoring: Secondary | ICD-10-CM

## 2021-01-23 DIAGNOSIS — T783XXD Angioneurotic edema, subsequent encounter: Secondary | ICD-10-CM

## 2021-01-23 DIAGNOSIS — R0609 Other forms of dyspnea: Secondary | ICD-10-CM

## 2021-01-23 NOTE — Patient Instructions (Signed)
-   You will be called to schedule your sleep study at home and your breathing tests - No need for oxygen

## 2021-02-12 ENCOUNTER — Emergency Department (HOSPITAL_COMMUNITY)
Admission: EM | Admit: 2021-02-12 | Discharge: 2021-02-12 | Disposition: A | Discharge status: Home / Self Care | Payer: Medicaid Other | Attending: Emergency Medicine | Admitting: Emergency Medicine

## 2021-02-12 ENCOUNTER — Encounter (HOSPITAL_COMMUNITY): Payer: Self-pay | Admitting: Emergency Medicine

## 2021-02-12 ENCOUNTER — Other Ambulatory Visit: Payer: Self-pay

## 2021-02-12 ENCOUNTER — Emergency Department (HOSPITAL_COMMUNITY): Payer: Medicaid Other

## 2021-02-12 DIAGNOSIS — R0602 Shortness of breath: Secondary | ICD-10-CM | POA: Diagnosis not present

## 2021-02-12 DIAGNOSIS — Z5321 Procedure and treatment not carried out due to patient leaving prior to being seen by health care provider: Secondary | ICD-10-CM | POA: Diagnosis not present

## 2021-02-12 LAB — BASIC METABOLIC PANEL
Anion gap: 8 (ref 5–15)
BUN: 22 mg/dL (ref 8–23)
CO2: 31 mmol/L (ref 22–32)
Calcium: 8.8 mg/dL — ABNORMAL LOW (ref 8.9–10.3)
Chloride: 102 mmol/L (ref 98–111)
Creatinine, Ser: 1.29 mg/dL — ABNORMAL HIGH (ref 0.61–1.24)
GFR, Estimated: >60 mL/min (ref >60)
Glucose, Bld: 128 mg/dL — ABNORMAL HIGH (ref 70–99)
Potassium: 4 mmol/L (ref 3.5–5.1)
Sodium: 141 mmol/L (ref 135–145)

## 2021-02-12 LAB — CBC
HCT: 44.4 % (ref 39.0–52.0)
Hemoglobin: 14.1 g/dL (ref 13.0–17.0)
MCH: 29.4 pg (ref 26.0–34.0)
MCHC: 31.8 g/dL (ref 30.0–36.0)
MCV: 92.5 fL (ref 80.0–100.0)
Platelets: 196 10*3/uL (ref 150–400)
RBC: 4.8 MIL/uL (ref 4.22–5.81)
RDW: 15.9 % — ABNORMAL HIGH (ref 11.5–15.5)
WBC: 7.1 10*3/uL (ref 4.0–10.5)
nRBC: 0 % (ref 0.0–0.2)

## 2021-02-12 LAB — TROPONIN I (HIGH SENSITIVITY)
Troponin I (High Sensitivity): 31 ng/L — ABNORMAL HIGH (ref <18)
Troponin I (High Sensitivity): 30 ng/L — ABNORMAL HIGH (ref <18)

## 2021-02-12 LAB — BRAIN NATRIURETIC PEPTIDE: B Natriuretic Peptide: 242.1 pg/mL — ABNORMAL HIGH (ref 0.0–100.0)

## 2021-02-12 NOTE — ED Triage Notes (Signed)
Pt c/o shortness of breath with exertion, chest tightness and inability to lie flat.

## 2021-02-12 NOTE — ED Notes (Signed)
Pt brought back into triage room for closer monitoring, as he is tachypneic in lobby, c/o sob. Sats 97% on RA. He has been drinking a 2L bottle of juice while in the lobby, encouraged to stop until all results back. Hx of CHF, last dose of Lasix last night. BNP added onto labwork, in process now. Pt also reports his sister died recently, funeral yesterday. Seems very distraught about this.

## 2021-02-13 ENCOUNTER — Encounter: Payer: Self-pay | Admitting: Cardiovascular Disease

## 2021-02-13 ENCOUNTER — Ambulatory Visit (INDEPENDENT_AMBULATORY_CARE_PROVIDER_SITE_OTHER): Payer: Medicaid Other | Admitting: Cardiovascular Disease

## 2021-02-13 VITALS — BP 142/86 | HR 102 | Ht 73.0 in | Wt 325.8 lb

## 2021-02-13 DIAGNOSIS — I5032 Chronic diastolic (congestive) heart failure: Secondary | ICD-10-CM | POA: Diagnosis not present

## 2021-02-13 DIAGNOSIS — I480 Paroxysmal atrial fibrillation: Secondary | ICD-10-CM | POA: Diagnosis not present

## 2021-02-13 DIAGNOSIS — I5042 Chronic combined systolic (congestive) and diastolic (congestive) heart failure: Secondary | ICD-10-CM

## 2021-02-13 DIAGNOSIS — G4733 Obstructive sleep apnea (adult) (pediatric): Secondary | ICD-10-CM | POA: Diagnosis not present

## 2021-02-13 DIAGNOSIS — F17200 Nicotine dependence, unspecified, uncomplicated: Secondary | ICD-10-CM

## 2021-02-13 MED ORDER — FUROSEMIDE 80 MG PO TABS: 80.0000 mg | ORAL_TABLET | Freq: Every day | ORAL | 3 refills | Status: DC

## 2021-02-13 MED ORDER — ALBUTEROL SULFATE HFA 108 (90 BASE) MCG/ACT IN AERS: 2.0000 | INHALATION_SPRAY | RESPIRATORY_TRACT | 0 refills | Status: DC | PRN

## 2021-02-13 MED ORDER — SPIRONOLACTONE 25 MG PO TABS: 25.0000 mg | ORAL_TABLET | Freq: Every day | ORAL | 3 refills | Status: DC

## 2021-02-13 MED ORDER — BISOPROLOL FUMARATE 5 MG PO TABS: 5.0000 mg | ORAL_TABLET | Freq: Every day | ORAL | 3 refills | Status: DC

## 2021-02-13 NOTE — Patient Instructions (Addendum)
Medication Instructions:  START the Bisoprolol 5 mg once daily START Spironolactone 25 mg once daily  INCREASE the Furosemide to 80 mg once daily  STOP the Carvedilol  *If you need a refill on your cardiac medications before your next appointment, please call your pharmacy*   Lab Work: Your provider would like for you to return in 3 weeks  to have the following labs drawn: BMET and BNP. You do not need an appointment for the lab. Once in our office lobby there is a podium where you can sign in and ring the doorbell to alert [place] that you are here. The lab is open from 8:00 am to 4:30 pm; closed for lunch from 12:45pm-1:45pm.  If you have labs (blood work) drawn today and your tests are completely normal, you will receive your results only by: MyChart Message (if you have MyChart) OR A paper copy in the mail If you have any lab test that is abnormal or we need to change your treatment, we will call you to review the results.   Testing/Procedures: None ordered   Follow-Up: At [facility], you and your health needs are our priority.  As part of our continuing mission to provide you with exceptional heart care, we have created designated Provider Care Teams.  These Care Teams include your primary Cardiologist (physician) and Advanced Practice Providers (APPs -  Physician Assistants and Nurse Practitioners) who all work together to provide you with the care you need, when you need it.  We recommend signing up for the patient portal called "MyChart".  Sign up information is provided on this After Visit Summary.  MyChart is used to connect with patients for Virtual Visits (Telemedicine).  Patients are able to view lab/test results, encounter notes, upcoming appointments, etc.  Non-urgent messages can be sent to your provider as well.   To learn more about what you can do with MyChart, go to [email or web address].    Your next appointment:   3 week(s)  The format for your next  appointment:   In Person  Provider:   You will see one of the following Advanced Practice Providers on your designated Care Team:   Azalee Course, PA-C Micah Flesher, PA-C or  Judy Pimple, [place]   Your physician recommends that you weigh yourself everyday at the same time, on the same scale and with the same amount of clothing. Please keep a record of these weights and bring to your next appointment.  Other Instructions  The Salty Six:     Reading food labels Check food labels for the amount of salt (sodium) per serving. Choose foods with less than 5 percent of the Daily Value of sodium. Generally, foods with less than 300 milligrams (mg) of sodium per serving fit into this eating plan. To find whole grains, look for the word "whole" as the first word in the ingredient list. Shopping Buy products labeled as "low-sodium" or "no salt added." Buy fresh foods. Avoid canned foods and pre-made or frozen meals. Cooking Avoid adding salt when cooking. Use salt-free seasonings or herbs instead of table salt or sea salt. Check with your health care provider or pharmacist before using salt substitutes. Do not fry foods. Allaire foods using healthy methods such as baking, boiling, grilling, roasting, and broiling instead. Nahm with heart-healthy oils, such as olive, canola, avocado, soybean, or sunflower oil. Meal planning  Eat a balanced diet that includes: 4 or more servings of fruits and 4 or more servings of vegetables each day.  Try to fill one-half of your plate with fruits and vegetables. 6-8 servings of whole grains each day. Less than 6 oz (170 g) of lean meat, poultry, or fish each day. A 3-oz (85-g) serving of meat is about the same size as a deck of cards. One egg equals 1 oz (28 g). 2-3 servings of low-fat dairy each day. One serving is 1 cup (237 mL). 1 serving of nuts, seeds, or beans 5 times each week. 2-3 servings of heart-healthy fats. Healthy fats called omega-3 fatty acids are  found in foods such as walnuts, flaxseeds, fortified milks, and eggs. These fats are also found in cold-water fish, such as sardines, salmon, and mackerel. Limit how much you eat of: Canned or prepackaged foods. Food that is high in trans fat, such as some fried foods. Food that is high in saturated fat, such as fatty meat. Desserts and other sweets, sugary drinks, and other foods with added sugar. Full-fat dairy products. Do not salt foods before eating. Do not eat more than 4 egg yolks a week. Try to eat at least 2 vegetarian meals a week. Eat more home-cooked food and less restaurant, buffet, and fast food. Lifestyle When eating at a restaurant, ask that your food be prepared with less salt or no salt, if possible. If you drink alcohol: Limit how much you use to: 0-1 drink a day for women who are not pregnant. 0-2 drinks a day for men. Be aware of how much alcohol is in your drink. In the [place] one drink equals one 12 oz bottle of beer (355 mL), one 5 oz glass of wine (148 mL), or one 1 oz glass of hard liquor (44 mL). General information Avoid eating more than 2,300 mg of salt a day. If you have hypertension, you may need to reduce your sodium intake to 1,500 mg a day. Work with your health care provider to maintain a healthy body weight or to lose weight. Ask what an ideal weight is for you. Get at least 30 minutes of exercise that causes your heart to beat faster (aerobic exercise) most days of the week. Activities may include walking, swimming, or biking. Work with your health care provider or dietitian to adjust your eating plan to your individual calorie needs. What foods should I eat? Fruits All fresh, dried, or frozen fruit. Canned fruit in natural juice (without added sugar). Vegetables Fresh or frozen vegetables (raw, steamed, roasted, or grilled). Low-sodium or reduced-sodium tomato and vegetable juice. Low-sodium or reduced-sodium tomato sauce and tomato paste. Low-sodium  or reduced-sodium canned vegetables. Grains Whole-grain or whole-wheat bread. Whole-grain or whole-wheat pasta. Brown rice. Orpah Cobb. Bulgur. Whole-grain and low-sodium cereals. Pita bread. Low-fat, low-sodium crackers. Whole-wheat flour tortillas. Meats and other proteins Skinless chicken or [place]. Ground chicken or [place]. Pork with fat trimmed off. Fish and seafood. Egg whites. Dried beans, peas, or lentils. Unsalted nuts, nut butters, and seeds. Unsalted canned beans. Lean cuts of beef with fat trimmed off. Low-sodium, lean precooked or cured meat, such as sausages or meat loaves. Dairy Low-fat (1%) or fat-free (skim) milk. Reduced-fat, low-fat, or fat-free cheeses. Nonfat, low-sodium ricotta or cottage cheese. Low-fat or nonfat yogurt. Low-fat, low-sodium cheese. Fats and oils Soft margarine without trans fats. Vegetable oil. Reduced-fat, low-fat, or light mayonnaise and salad dressings (reduced-sodium). Canola, safflower, olive, avocado, soybean, and sunflower oils. Avocado. Seasonings and condiments Herbs. Spices. Seasoning mixes without salt. Other foods Unsalted popcorn and pretzels. Fat-free sweets. The items listed above may not be  a complete list of foods and beverages you can eat. Contact a dietitian for more information. What foods should I avoid? Fruits Canned fruit in a light or heavy syrup. Fried fruit. Fruit in cream or butter sauce. Vegetables Creamed or fried vegetables. Vegetables in a cheese sauce. Regular canned vegetables (not low-sodium or reduced-sodium). Regular canned tomato sauce and paste (not low-sodium or reduced-sodium). Regular tomato and vegetable juice (not low-sodium or reduced-sodium). Rosita Fire. Olives. Grains Baked goods made with fat, such as croissants, muffins, or some breads. Dry pasta or rice meal packs. Meats and other proteins Fatty cuts of meat. Ribs. Fried meat. Tomasa Blase. Bologna, salami, and other precooked or cured meats, such as sausages or  meat loaves. Fat from the back of a pig (fatback). Bratwurst. Salted nuts and seeds. Canned beans with added salt. Canned or smoked fish. Whole eggs or egg yolks. Chicken or [place] with skin. Dairy Whole or 2% milk, cream, and half-and-half. Whole or full-fat cream cheese. Whole-fat or sweetened yogurt. Full-fat cheese. Nondairy creamers. Whipped toppings. Processed cheese and cheese spreads. Fats and oils Butter. Stick margarine. Lard. Shortening. Ghee. Bacon fat. Tropical oils, such as coconut, palm kernel, or palm oil. Seasonings and condiments Onion salt, garlic salt, seasoned salt, table salt, and sea salt. Worcestershire sauce. Tartar sauce. Barbecue sauce. Teriyaki sauce. Soy sauce, including reduced-sodium. Steak sauce. Canned and packaged gravies. Fish sauce. Oyster sauce. Cocktail sauce. Store-bought horseradish. Ketchup. Mustard. Meat flavorings and tenderizers. Bouillon cubes. Hot sauces. Pre-made or packaged marinades. Pre-made or packaged taco seasonings. Relishes. Regular salad dressings. Other foods Salted popcorn and pretzels. The items listed above may not be a complete list of foods and beverages you should avoid. Contact a dietitian for more information. Where to find more information National Heart, Lung, and Blood Institute: PopSteam.is American Heart Association: www.heart.org Academy of Nutrition and Dietetics: www.eatright.org National Kidney Foundation: www.kidney.org Summary The DASH eating plan is a healthy eating plan that has been shown to reduce high blood pressure (hypertension). It may also reduce your risk for type 2 diabetes, heart disease, and stroke. When on the DASH eating plan, aim to eat more fresh fruits and vegetables, whole grains, lean proteins, low-fat dairy, and heart-healthy fats. With the DASH eating plan, you should limit salt (sodium) intake to 2,300 mg a day. If you have hypertension, you may need to reduce your sodium intake to 1,500 mg a  day. Work with your health care provider or dietitian to adjust your eating plan to your individual calorie needs. This information is not intended to replace advice given to you by your health care provider. Make sure you discuss any questions you have with your health care provider. Document Revised: 03/30/2019 Document Reviewed: 03/30/2019 Elsevier Patient Education  2022 [employer].

## 2021-02-13 NOTE — Progress Notes (Signed)
Cardiology Office Note:    Date:  02/14/2021   ID:  DOCK BACCAM, DOB 1955-07-07, MRN [id number]  PCP:  System, Provider Not In   [facility] Providers Cardiologist:  Thurmon Fair, MD     Referring MD: Newt Minion*   Chief Complaint  Patient presents with   Congestive Heart Failure   Atrial Fibrillation     History of Present Illness:    Zachary Hawkins is a 65 y.o. male with a hx of morbid obesity, obstructive sleep apnea (untreated), dilated cardiomyopathy (EF 40-45% echo 2019, improved to 60-65% in September 2022), history of smoking, chronic kidney disease stage III, hypertension and paroxysmal atrial fibrillation.  Due to financial difficulties, he has been unable to afford CPAP and often runs out of his medications.  He has a history of recurrent angioedema that has occurred both with and without ACE inhibitors.  He has had 3 separate episodes of throat swelling this year.  Although the initial episode in June was felt to be related to treatment with lisinopril, two further events occurred without this medication on August 28 and September 2.  He was recently admitted with an episode of upper airway obstruction due to angioedema, requiring epinephrine.  He was treated with steroids and antihistamines.  Labs noted for normal C4 suggesting against C1 esterase deficiency. Tryptase negative, C1 Esterase Inhibitor slightly elevated at 52.  He was discharged on January 17, 2021.  Cardiology was not consulted during that admission and it does not appear that he required diuretics.  An echocardiogram performed during that hospitalization showed normalization of left ventricular systolic function although there was evidence of grade 1 diastolic dysfunction.  Both atria were described as normal in size.  Past Medical History:  Diagnosis Date   Cardiomyopathy (HCC) 06/2017   EF 40-45%   Chronic renal insufficiency, stage 3 (moderate) (HCC)    Diastolic dysfunction  06/2017   grade 2    Hypertension    Hypertensive urgency    Obesity     No past surgical history on file.  Current Medications: Current Meds  Medication Sig   ADVAIR HFA 115-21 MCG/ACT inhaler INHALE 2 PUFFS INTO THE LUNGS 2 (TWO) TIMES DAILY.   amLODipine (NORVASC) 10 MG tablet TAKE 1 TABLET (10 MG TOTAL) BY MOUTH DAILY. (Patient taking differently: Take 10 mg by mouth daily.)   apixaban (ELIQUIS) 5 MG TABS tablet Take 1 tablet (5 mg total) by mouth 2 (two) times daily.   bisoprolol (ZEBETA) 5 MG tablet Take 1 tablet (5 mg total) by mouth daily.   EPINEPHrine 0.3 mg/0.3 mL IJ SOAJ injection Inject 0.3 mg into the muscle as needed for anaphylaxis.   famotidine (PEPCID) 20 MG tablet Take 1 tablet (20 mg total) by mouth 2 (two) times daily.   fluticasone (FLONASE) 50 MCG/ACT nasal spray PLACE 2 SPRAYS INTO BOTH NOSTRILS DAILY. (Patient taking differently: Place 2 sprays into both nostrils daily as needed for allergies.)   isosorbide dinitrate (ISORDIL) 20 MG tablet TAKE 1 TABLET (20 MG TOTAL) BY MOUTH 3 (THREE) TIMES DAILY. SCHEDULE OFFICE VISIT (Patient taking differently: Take 20 mg by mouth 3 (three) times daily.)   loratadine (CLARITIN) 10 MG tablet Take 1 tablet (10 mg total) by mouth daily.   Multiple Vitamin (MULTIVITAMIN WITH MINERALS) TABS tablet Take 1 tablet by mouth daily.   polyethylene glycol (MIRALAX / GLYCOLAX) 17 g packet Take 17 g by mouth daily as needed for moderate constipation.   predniSONE (DELTASONE) 20  MG tablet Take 2 tablets (40 mg total) by mouth daily with breakfast. Take  ( 2 tabs) daily for 5 days, then  (1 tab) daily for 5 days, then  (half tab) daily for 5 days.   sodium chloride (OCEAN) 0.65 % SOLN nasal spray Place 2 sprays into both nostrils 3 (three) times daily. (Patient taking differently: Place 2 sprays into both nostrils as needed for congestion.)   spironolactone (ALDACTONE) 25 MG tablet Take 1 tablet (25 mg total) by mouth daily.    tamsulosin (FLOMAX) 0.4 MG CAPS capsule Take 1 capsule (0.4 mg total) by mouth daily after supper.   [DISCONTINUED] albuterol (VENTOLIN HFA) 108 (90 Base) MCG/ACT inhaler Inhale 2 puffs into the lungs every 4 (four) hours as needed for wheezing or shortness of breath.     Allergies:   Ace inhibitors, Black cherry fruit extract [name]], and Conservation officer, nature agent]   Social History   Socioeconomic History   Marital status: Single    Spouse name: Not on file   Number of children: Not on file   Years of education: Not on file   Highest education level: Not on file  Occupational History   Not on file  Tobacco Use   Smoking status: Every Day    Packs/day: 1.00    Types: Cigarettes   Smokeless tobacco: Never   Tobacco comments:    1 pack last 2-3 days- states stopped 01/04/21.  Substance and Sexual Activity   Alcohol use: Yes    Alcohol/week: 1.0 standard drink    Types: 1 Shots of liquor per week    Comment: socially   Drug use: Yes    Types: Marijuana    Comment: every other week   Sexual activity: Not on file  Other Topics Concern   Not on file  Social History Narrative   Not on file   Social Determinants of Health   Financial Resource Strain: Not on file  Food Insecurity: Not on file  Transportation Needs: Not on file  Physical Activity: Not on file  Stress: Not on file  Social Connections: Not on file     Family History: The patient's family history includes Diabetes in his father and mother.  ROS:   Please see the history of present illness.     All other systems reviewed and are negative.  EKGs/Labs/Other Studies Reviewed:    The following studies were reviewed today: Echocardiogram 01/08/2021  1. Left ventricular ejection fraction, by estimation, is 60 to 65%. The  left ventricle has normal function. The left ventricle has no regional  wall motion abnormalities. There is mild left ventricular hypertrophy.  Left ventricular diastolic  parameters  are consistent with Grade I diastolic dysfunction (impaired relaxation).   2. Right ventricular systolic function is normal. The right ventricular  size is normal.   3. The mitral valve is normal in structure. No evidence of mitral valve  regurgitation. No evidence of mitral stenosis.   4. The aortic valve is normal in structure. Aortic valve regurgitation is  not visualized. No aortic stenosis is present.   5. Aortic dilatation noted. There is mild dilatation of the aortic root,  measuring 40 mm. There is borderline dilatation of the ascending aorta,  measuring 39 mm.   6. The inferior vena cava is normal in size with greater than 50%  respiratory variability, suggesting right atrial pressure of 3 mmHg.   EKG:  EKG is ordered today.  The ekg ordered today demonstrates A.  fib with a ventricular rate of 102 bpm.  Yesterday's electrocardiogram in the emergency room also showed atrial fibrillation with a ventricular rate of 106 bpm.  Recent Labs: 10/17/2020: ALT 25 01/16/2021: Magnesium 2.2 02/12/2021: B Natriuretic Peptide 242.1; BUN 22; Creatinine, Ser 1.29; Hemoglobin 14.1; Platelets 196; Potassium 4.0; Sodium 141  Recent Lipid Panel    Component Value Date/Time   CHOL 189 07/11/2020 1103   TRIG 126 01/05/2021 0645   HDL 49 07/11/2020 1103   CHOLHDL 3.9 07/11/2020 1103   LDLCALC 109 (H) 07/11/2020 1103     Risk Assessment/Calculations:    CHA2DS2-VASc Score = 4   This indicates a 4.8% annual risk of stroke. The patient's score is based upon: CHF History: 1 HTN History: 1 Diabetes History: 0 Stroke History: 2 Vascular Disease History: 0 Age Score: 0 Gender Score: 0         Physical Exam:    VS:  BP (!) 142/86   Pulse (!) 102   Ht 6\' 1"  (1.854 m)   Wt (!) 147.8 kg   SpO2 93%   BMI 42.98 kg/m     Wt Readings from Last 3 Encounters:  02/13/21 (!) 147.8 kg  01/23/21 (!) 139.7 kg  01/16/21 (!) 136.1 kg     GEN: Morbidly obese. Well nourished, well  developed in no acute distress HEENT: Normal NECK: No JVD; No carotid bruits LYMPHATICS: No lymphadenopathy CARDIAC: irregular, no murmurs, rubs, gallops RESPIRATORY:  Clear to auscultation without rales, wheezing or rhonchi  ABDOMEN: Soft, non-tender, non-distended MUSCULOSKELETAL:  No edema; No deformity  SKIN: Warm and dry NEUROLOGIC:  Alert and oriented x 3 PSYCHIATRIC:  Normal affect   ASSESSMENT:    1. Paroxysmal atrial fibrillation (HCC)   2. Chronic diastolic (congestive) heart failure (HCC)   3. OSA (obstructive sleep apnea)   4. Morbid obesity (HCC)   5. Smoking    PLAN:    In order of problems listed above:  Afib: Poorly rate controlled.  He has reactive airway disease and I am reluctant to increase his carvedilol.  We will switch to bisoprolol 5 mg daily.  He is on apixaban anticoagulation. CHF: He has clear clinical findings of volume overload and he weighs 22 pounds more than he did in June.  We will increase his furosemide to 80 mg daily and add spironolactone.  Check clinical response and labs in about 3 weeks.  Thankfully, his most recent echocardiogram shows complete normalization of left ventricular systolic function.  Discussed the importance of avoiding high sodium foods.  Recent treatment with steroids may have promoted sodium/fluid retention.  He has a history of angioedema (although this appears to be idiopathic and not just associated with with ACE inhibitors) and should not receive ARB or Entresto either.  He is on combination of hydralazine and nitrates. OSA: He is being seen in the pulmonary clinic and they are "getting him CPAP". Morbid obesity: This is having many negative health implications.  Strongly encouraged sustained efforts at losing weight. Smoking: He reports that he quit permanently about a month ago.        Medication Adjustments/Labs and Tests Ordered: Current medicines are reviewed at length with the patient today.  Concerns regarding  medicines are outlined above.  Orders Placed This Encounter  Procedures   Brain natriuretic peptide   Basic metabolic panel   EKG 12-Lead    Meds ordered this encounter  Medications   bisoprolol (ZEBETA) 5 MG tablet    Sig: Take 1 tablet (  5 mg total) by mouth daily.    Dispense:  90 tablet    Refill:  3   albuterol (VENTOLIN HFA) 108 (90 Base) MCG/ACT inhaler    Sig: Inhale 2 puffs into the lungs every 4 (four) hours as needed for wheezing or shortness of breath.    Dispense:  18 g    Refill:  0   furosemide (LASIX) 80 MG tablet    Sig: Take 1 tablet (80 mg total) by mouth daily.    Dispense:  90 tablet    Refill:  3   spironolactone (ALDACTONE) 25 MG tablet    Sig: Take 1 tablet (25 mg total) by mouth daily.    Dispense:  90 tablet    Refill:  3     Patient Instructions  Medication Instructions:  START the Bisoprolol 5 mg once daily START Spironolactone 25 mg once daily  INCREASE the Furosemide to 80 mg once daily  STOP the Carvedilol  *If you need a refill on your cardiac medications before your next appointment, please call your pharmacy*   Lab Work: Your provider would like for you to return in 3 weeks  to have the following labs drawn: BMET and BNP. You do not need an appointment for the lab. Once in our office lobby there is a podium where you can sign in and ring the doorbell to alert [place] that you are here. The lab is open from 8:00 am to 4:30 pm; closed for lunch from 12:45pm-1:45pm.  If you have labs (blood work) drawn today and your tests are completely normal, you will receive your results only by: MyChart Message (if you have MyChart) OR A paper copy in the mail If you have any lab test that is abnormal or we need to change your treatment, we will call you to review the results.   Testing/Procedures: None ordered   Follow-Up: At [facility], you and your health needs are our priority.  As part of our continuing mission to provide you with  exceptional heart care, we have created designated Provider Care Teams.  These Care Teams include your primary Cardiologist (physician) and Advanced Practice Providers (APPs -  Physician Assistants and Nurse Practitioners) who all work together to provide you with the care you need, when you need it.  We recommend signing up for the patient portal called "MyChart".  Sign up information is provided on this After Visit Summary.  MyChart is used to connect with patients for Virtual Visits (Telemedicine).  Patients are able to view lab/test results, encounter notes, upcoming appointments, etc.  Non-urgent messages can be sent to your provider as well.   To learn more about what you can do with MyChart, go to [email or web address].    Your next appointment:   3 week(s)  The format for your next appointment:   In Person  Provider:   You will see one of the following Advanced Practice Providers on your designated Care Team:   Azalee Course, PA-C Micah Flesher, PA-C or  Judy Pimple, [place]   Your physician recommends that you weigh yourself everyday at the same time, on the same scale and with the same amount of clothing. Please keep a record of these weights and bring to your next appointment.  Other Instructions  The Salty Six:     Reading food labels Check food labels for the amount of salt (sodium) per serving. Choose foods with less than 5 percent of the Daily Value of sodium. Generally, foods with  less than 300 milligrams (mg) of sodium per serving fit into this eating plan. To find whole grains, look for the word "whole" as the first word in the ingredient list. Shopping Buy products labeled as "low-sodium" or "no salt added." Buy fresh foods. Avoid canned foods and pre-made or frozen meals. Cooking Avoid adding salt when cooking. Use salt-free seasonings or herbs instead of table salt or sea salt. Check with your health care provider or pharmacist before using salt substitutes. Do not  fry foods. Rubinstein foods using healthy methods such as baking, boiling, grilling, roasting, and broiling instead. [place] with heart-healthy oils, such as olive, canola, avocado, soybean, or sunflower oil. Meal planning  Eat a balanced diet that includes: 4 or more servings of fruits and 4 or more servings of vegetables each day. Try to fill one-half of your plate with fruits and vegetables. 6-8 servings of whole grains each day. Less than 6 oz (170 g) of lean meat, poultry, or fish each day. A 3-oz (85-g) serving of meat is about the same size as a deck of cards. One egg equals 1 oz (28 g). 2-3 servings of low-fat dairy each day. One serving is 1 cup (237 mL). 1 serving of nuts, seeds, or beans 5 times each week. 2-3 servings of heart-healthy fats. Healthy fats called omega-3 fatty acids are found in foods such as walnuts, flaxseeds, fortified milks, and eggs. These fats are also found in cold-water fish, such as sardines, salmon, and mackerel. Limit how much you eat of: Canned or prepackaged foods. Food that is high in trans fat, such as some fried foods. Food that is high in saturated fat, such as fatty meat. Desserts and other sweets, sugary drinks, and other foods with added sugar. Full-fat dairy products. Do not salt foods before eating. Do not eat more than 4 egg yolks a week. Try to eat at least 2 vegetarian meals a week. Eat more home-cooked food and less restaurant, buffet, and fast food. Lifestyle When eating at a restaurant, ask that your food be prepared with less salt or no salt, if possible. If you drink alcohol: Limit how much you use to: 0-1 drink a day for women who are not pregnant. 0-2 drinks a day for men. Be aware of how much alcohol is in your drink. In the [place] one drink equals one 12 oz bottle of beer (355 mL), one 5 oz glass of wine (148 mL), or one 1 oz glass of hard liquor (44 mL). General information Avoid eating more than 2,300 mg of salt a day. If you have  hypertension, you may need to reduce your sodium intake to 1,500 mg a day. Work with your health care provider to maintain a healthy body weight or to lose weight. Ask what an ideal weight is for you. Get at least 30 minutes of exercise that causes your heart to beat faster (aerobic exercise) most days of the week. Activities may include walking, swimming, or biking. Work with your health care provider or dietitian to adjust your eating plan to your individual calorie needs. What foods should I eat? Fruits All fresh, dried, or frozen fruit. Canned fruit in natural juice (without added sugar). Vegetables Fresh or frozen vegetables (raw, steamed, roasted, or grilled). Low-sodium or reduced-sodium tomato and vegetable juice. Low-sodium or reduced-sodium tomato sauce and tomato paste. Low-sodium or reduced-sodium canned vegetables. Grains Whole-grain or whole-wheat bread. Whole-grain or whole-wheat pasta. Brown rice. Orpah Cobb. Bulgur. Whole-grain and low-sodium cereals. Pita bread. Low-fat, low-sodium  crackers. Whole-wheat flour tortillas. Meats and other proteins Skinless chicken or [place]. Ground chicken or [place]. Pork with fat trimmed off. Fish and seafood. Egg whites. Dried beans, peas, or lentils. Unsalted nuts, nut butters, and seeds. Unsalted canned beans. Lean cuts of beef with fat trimmed off. Low-sodium, lean precooked or cured meat, such as sausages or meat loaves. Dairy Low-fat (1%) or fat-free (skim) milk. Reduced-fat, low-fat, or fat-free cheeses. Nonfat, low-sodium ricotta or cottage cheese. Low-fat or nonfat yogurt. Low-fat, low-sodium cheese. Fats and oils Soft margarine without trans fats. Vegetable oil. Reduced-fat, low-fat, or light mayonnaise and salad dressings (reduced-sodium). Canola, safflower, olive, avocado, soybean, and sunflower oils. Avocado. Seasonings and condiments Herbs. Spices. Seasoning mixes without salt. Other foods Unsalted popcorn and pretzels. Fat-free  sweets. The items listed above may not be a complete list of foods and beverages you can eat. Contact a dietitian for more information. What foods should I avoid? Fruits Canned fruit in a light or heavy syrup. Fried fruit. Fruit in cream or butter sauce. Vegetables Creamed or fried vegetables. Vegetables in a cheese sauce. Regular canned vegetables (not low-sodium or reduced-sodium). Regular canned tomato sauce and paste (not low-sodium or reduced-sodium). Regular tomato and vegetable juice (not low-sodium or reduced-sodium). Rosita Fire. Olives. Grains Baked goods made with fat, such as croissants, muffins, or some breads. Dry pasta or rice meal packs. Meats and other proteins Fatty cuts of meat. Ribs. Fried meat. Tomasa Blase. Bologna, salami, and other precooked or cured meats, such as sausages or meat loaves. Fat from the back of a pig (fatback). Bratwurst. Salted nuts and seeds. Canned beans with added salt. Canned or smoked fish. Whole eggs or egg yolks. Chicken or [place] with skin. Dairy Whole or 2% milk, cream, and half-and-half. Whole or full-fat cream cheese. Whole-fat or sweetened yogurt. Full-fat cheese. Nondairy creamers. Whipped toppings. Processed cheese and cheese spreads. Fats and oils Butter. Stick margarine. Lard. Shortening. Ghee. Bacon fat. Tropical oils, such as coconut, palm kernel, or palm oil. Seasonings and condiments Onion salt, garlic salt, seasoned salt, table salt, and sea salt. Worcestershire sauce. Tartar sauce. Barbecue sauce. Teriyaki sauce. Soy sauce, including reduced-sodium. Steak sauce. Canned and packaged gravies. Fish sauce. Oyster sauce. Cocktail sauce. Store-bought horseradish. Ketchup. Mustard. Meat flavorings and tenderizers. Bouillon cubes. Hot sauces. Pre-made or packaged marinades. Pre-made or packaged taco seasonings. Relishes. Regular salad dressings. Other foods Salted popcorn and pretzels. The items listed above may not be a complete list of foods and  beverages you should avoid. Contact a dietitian for more information. Where to find more information National Heart, Lung, and Blood Institute: PopSteam.is American Heart Association: www.heart.org Academy of Nutrition and Dietetics: www.eatright.org National Kidney Foundation: www.kidney.org Summary The DASH eating plan is a healthy eating plan that has been shown to reduce high blood pressure (hypertension). It may also reduce your risk for type 2 diabetes, heart disease, and stroke. When on the DASH eating plan, aim to eat more fresh fruits and vegetables, whole grains, lean proteins, low-fat dairy, and heart-healthy fats. With the DASH eating plan, you should limit salt (sodium) intake to 2,300 mg a day. If you have hypertension, you may need to reduce your sodium intake to 1,500 mg a day. Work with your health care provider or dietitian to adjust your eating plan to your individual calorie needs. This information is not intended to replace advice given to you by your health care provider. Make sure you discuss any questions you have with your health care provider. Document Revised: 03/30/2019 Document  Reviewed: 03/30/2019 Elsevier Patient Education  2022 Elsevier Inc.   Signed, Thurmon Fair, MD  02/14/2021 8:47 PM    [facility]

## 2021-02-18 ENCOUNTER — Telehealth: Payer: Self-pay

## 2021-02-18 NOTE — Telephone Encounter (Signed)
NOTES SCANNED TO REFERRAL 

## 2021-03-09 ENCOUNTER — Other Ambulatory Visit: Payer: Self-pay

## 2021-03-09 ENCOUNTER — Ambulatory Visit: Payer: Medicaid Other | Admitting: Physician Assistant

## 2021-03-09 ENCOUNTER — Encounter: Payer: Self-pay | Admitting: Physician Assistant

## 2021-03-09 VITALS — BP 130/80 | HR 105

## 2021-03-09 DIAGNOSIS — G4733 Obstructive sleep apnea (adult) (pediatric): Secondary | ICD-10-CM

## 2021-03-09 DIAGNOSIS — I420 Dilated cardiomyopathy: Secondary | ICD-10-CM

## 2021-03-09 DIAGNOSIS — R531 Weakness: Secondary | ICD-10-CM | POA: Diagnosis not present

## 2021-03-09 DIAGNOSIS — I480 Paroxysmal atrial fibrillation: Secondary | ICD-10-CM

## 2021-03-09 DIAGNOSIS — Z79899 Other long term (current) drug therapy: Secondary | ICD-10-CM | POA: Diagnosis not present

## 2021-03-09 DIAGNOSIS — J9611 Chronic respiratory failure with hypoxia: Secondary | ICD-10-CM

## 2021-03-09 DIAGNOSIS — I10 Essential (primary) hypertension: Secondary | ICD-10-CM

## 2021-03-09 DIAGNOSIS — J9612 Chronic respiratory failure with hypercapnia: Secondary | ICD-10-CM

## 2021-03-09 MED ORDER — SPIRONOLACTONE 25 MG PO TABS: 25.0000 mg | ORAL_TABLET | Freq: Every day | ORAL | 3 refills | Status: DC

## 2021-03-09 MED ORDER — HYDRALAZINE HCL 50 MG PO TABS: 25.0000 mg | ORAL_TABLET | Freq: Three times a day (TID) | ORAL | 3 refills | Status: DC

## 2021-03-09 NOTE — Progress Notes (Signed)
Cardiology Office Note:    Date:  03/11/2021   ID:  DERAY KIMBRO, DOB Sep 01, 1955, MRN [id number]  PCP:  System, Provider Not In   [place] Cardiologist:  Sanda Klein, MD     Referring MD: No ref. provider found   Chief Complaint  Patient presents with   Follow-up    Seen for Dr. Sallyanne Kuster    History of Present Illness:    Zachary Hawkins is a 66 y.o. male with a hx of morbid obesity, untreated obstructive sleep apnea, dilated cardiomyopathy with improved EF, tobacco abuse, CKD stage III, hypertension and PAF.  Due to financial difficulties, he is unable to afford CPAP therapy and often around of medication.  Previous echocardiogram obtained in January 2019 showed EF 40 to 45%, grade 2 DD, mild LVH.  He has a history of recurrent angioedema that occurred both with and without ACE inhibitor.  Initial episode he has had 3 episodes of throat swelling this year in June, August and September.  Echocardiogram obtained on 01/08/2021 showed EF 60 to 65%, grade 1 DD, no regional wall motion abnormality, mild dilatation of the aortic root measuring at 40 mm.  Patient was last seen by Dr. Sallyanne Kuster in October 2022 at which time he had quit smoking for about a month.  He was noted to be in poorly rate controlled A. fib at the time.  Due to his reactive airway disease, carvedilol was stopped and he was switched to bisoprolol 5 mg daily.  He also had signs of volume overload and had a 22 pound weight gain, Lasix was increased to 80 mg daily and the spironolactone was also added to his medical regimen.  Looking at his medication bottles, for some reason, his PCP increased his spironolactone from the previous 25 mg daily to 100 mg daily on 03/03/2021.  When the patient first arrived, his weight has increased from the last visit, he was very drowsy in the initial interview.  O2 saturation was in the 70s to 80s, however came up to the 90s with oxygen therapy.  He is accompanied by his neighbor.   He reportedly has a portable oxygen machine at home which he only use as needed.  When I first interviewed the patient, his head was bobbing back-and-forth and he was dozing off frequently.  Talking with the patient, he does not have feel he is going to pass out, however he has trouble picking up things and was quite weak at home.  He will need CMP, CBC and a BMP along with TSH.  His blood pressure is 130/80 even though he has not taken any of his blood pressure medication this morning.  I will stop his amlodipine.  I also recommended cutting back hydralazine to 25 mg 3 times a day.  His spironolactone will also be cut back down to 25 mg daily.  I spoke with Dr. Sallyanne Kuster who also saw the patient, we discussed possible hospitalization to the patient who does not feel he needed to go to the hospital.  Dr. Sallyanne Kuster recommended follow-up within 1 week as he has high likelihood of going to the hospital in the near future if his condition does not improve.  Past Medical History:  Diagnosis Date   Cardiomyopathy ([place]) 06/2017   EF 40-45%   Chronic renal insufficiency, stage 3 (moderate) (HCC)    Diastolic dysfunction [id number]   grade 2    Hypertension    Hypertensive urgency    Obesity  History reviewed. No pertinent surgical history.  Current Medications: Current Meds  Medication Sig   ADVAIR HFA 115-21 MCG/ACT inhaler INHALE 2 PUFFS INTO THE LUNGS 2 (TWO) TIMES DAILY.   albuterol (VENTOLIN HFA) 108 (90 Base) MCG/ACT inhaler Inhale 2 puffs into the lungs every 4 (four) hours as needed for wheezing or shortness of breath.   apixaban (ELIQUIS) 5 MG TABS tablet Take 1 tablet (5 mg total) by mouth 2 (two) times daily.   atorvastatin (LIPITOR) 10 MG tablet TAKE 1 TABLET (10 MG TOTAL) BY MOUTH DAILY. (Patient taking differently: Take 10 mg by mouth daily.)   bisoprolol (ZEBETA) 5 MG tablet Take 1 tablet (5 mg total) by mouth daily. (Patient not taking: No sig reported)   EPINEPHrine 0.3 mg/0.3 mL IJ  SOAJ injection Inject 0.3 mg into the muscle as needed for anaphylaxis. (Patient not taking: No sig reported)   famotidine (PEPCID) 20 MG tablet Take 1 tablet (20 mg total) by mouth 2 (two) times daily.   fluticasone (FLONASE) 50 MCG/ACT nasal spray PLACE 2 SPRAYS INTO BOTH NOSTRILS DAILY. (Patient not taking: No sig reported)   furosemide (LASIX) 80 MG tablet Take 1 tablet (80 mg total) by mouth daily. (Patient not taking: No sig reported)   isosorbide dinitrate (ISORDIL) 20 MG tablet TAKE 1 TABLET (20 MG TOTAL) BY MOUTH 3 (THREE) TIMES DAILY. SCHEDULE OFFICE VISIT (Patient taking differently: Take 20 mg by mouth 3 (three) times daily.)   loratadine (CLARITIN) 10 MG tablet Take 1 tablet (10 mg total) by mouth daily.   Multiple Vitamin (MULTIVITAMIN WITH MINERALS) TABS tablet Take 1 tablet by mouth daily. (Patient not taking: No sig reported)   polyethylene glycol (MIRALAX / GLYCOLAX) 17 g packet Take 17 g by mouth daily as needed for moderate constipation. (Patient not taking: No sig reported)   predniSONE (DELTASONE) 20 MG tablet Take 2 tablets (40 mg total) by mouth daily with breakfast. Take 40mg  ( 2 tabs) daily for 5 days, then 20mg  (1 tab) daily for 5 days, then 10mg  (half tab) daily for 5 days. (Patient not taking: No sig reported)   sodium chloride (OCEAN) 0.65 % SOLN nasal spray Place 2 sprays into both nostrils 3 (three) times daily. (Patient not taking: No sig reported)   tamsulosin (FLOMAX) 0.4 MG CAPS capsule Take 1 capsule (0.4 mg total) by mouth daily after supper. (Patient not taking: No sig reported)   [DISCONTINUED] amLODipine (NORVASC) 10 MG tablet TAKE 1 TABLET (10 MG TOTAL) BY MOUTH DAILY. (Patient taking differently: Take 10 mg by mouth daily.)   [DISCONTINUED] hydrALAZINE (APRESOLINE) 50 MG tablet TAKE 1 TABLET (50 MG TOTAL) BY MOUTH 3 (THREE) TIMES DAILY. (Patient taking differently: Take 50 mg by mouth 3 (three) times daily.)   [DISCONTINUED] spironolactone (ALDACTONE) 25 MG  tablet Take 1 tablet (25 mg total) by mouth daily.     Allergies:   Ace inhibitors, Black cherry fruit extract Marcelline Mates extract], Grenadine flavor [flavoring agent], and Chlorhexidine   Social History   Socioeconomic History   Marital status: Single    Spouse name: Not on file   Number of children: Not on file   Years of education: Not on file   Highest education level: Not on file  Occupational History   Not on file  Tobacco Use   Smoking status: Every Day    Packs/day: 1.00    Types: Cigarettes   Smokeless tobacco: Never   Tobacco comments:    1 pack last 2-3 days- states  stopped 01/04/21.  Substance and Sexual Activity   Alcohol use: Yes    Alcohol/week: 1.0 standard drink    Types: 1 Shots of liquor per week    Comment: socially   Drug use: Yes    Types: Marijuana    Comment: every other week   Sexual activity: Not on file  Other Topics Concern   Not on file  Social History Narrative   Not on file   Social Determinants of Health   Financial Resource Strain: Not on file  Food Insecurity: Not on file  Transportation Needs: Not on file  Physical Activity: Not on file  Stress: Not on file  Social Connections: Not on file     Family History: The patient's family history includes Diabetes in his father and mother.  ROS:   Please see the history of present illness.     All other systems reviewed and are negative.  EKGs/Labs/Other Studies Reviewed:    The following studies were reviewed today:  Echo 01/08/2021 1. Left ventricular ejection fraction, by estimation, is 60 to 65%. The  left ventricle has normal function. The left ventricle has no regional  wall motion abnormalities. There is mild left ventricular hypertrophy.  Left ventricular diastolic parameters  are consistent with Grade I diastolic dysfunction (impaired relaxation).   2. Right ventricular systolic function is normal. The right ventricular  size is normal.   3. The mitral valve is normal in  structure. No evidence of mitral valve  regurgitation. No evidence of mitral stenosis.   4. The aortic valve is normal in structure. Aortic valve regurgitation is  not visualized. No aortic stenosis is present.   5. Aortic dilatation noted. There is mild dilatation of the aortic root,  measuring 40 mm. There is borderline dilatation of the ascending aorta,  measuring 39 mm.   6. The inferior vena cava is normal in size with greater than 50%  respiratory variability, suggesting right atrial pressure of 3 mmHg.   EKG:  EKG is ordered today.  The ekg ordered today demonstrates sinus tachycardia, heart rate 110s, no significant ST-T wave changes.  Recent Labs: 10/17/2020: ALT 25 01/16/2021: Magnesium 2.2 03/10/2021: B Natriuretic Peptide 28.6 03/11/2021: BUN 22; Creatinine, Ser 1.31; Hemoglobin 15.3; Platelets 198; Potassium 4.3; Sodium 140  Recent Lipid Panel    Component Value Date/Time   CHOL 189 07/11/2020 1103   TRIG 126 01/05/2021 0645   HDL 49 07/11/2020 1103   CHOLHDL 3.9 07/11/2020 1103   LDLCALC 109 (H) 07/11/2020 1103     Risk Assessment/Calculations:    CHA2DS2-VASc Score = 4   This indicates a 4.8% annual risk of stroke. The patient's score is based upon: CHF History: 1 HTN History: 1 Diabetes History: 0 Stroke History: 2 Vascular Disease History: 0 Age Score: 0 Gender Score: 0          Physical Exam:    VS:  BP 130/80 (BP Location: Right Arm, Patient Position: Sitting, Cuff Size: Large)   Pulse (!) 105   Ht (P) 6\' 1"  (1.854 m)   Wt (!) (P) 334 lb 3.2 oz (151.6 kg)   SpO2 94%   BMI (P) 44.09 kg/m     Wt Readings from Last 3 Encounters:  03/10/21 (!) 352 lb 11.8 oz (160 kg)  03/09/21 (!) (P) 334 lb 3.2 oz (151.6 kg)  02/13/21 (!) 325 lb 12.8 oz (147.8 kg)     GEN:  Well nourished, well developed in no acute distress HEENT: Normal NECK:  No JVD; No carotid bruits LYMPHATICS: No lymphadenopathy CARDIAC: RRR, no murmurs, rubs, gallops RESPIRATORY:   Clear to auscultation without rales, wheezing or rhonchi  ABDOMEN: Soft, non-tender, non-distended MUSCULOSKELETAL:  No edema; No deformity  SKIN: Warm and dry NEUROLOGIC:  Alert and oriented x 3 PSYCHIATRIC:  Normal affect   ASSESSMENT:    1. Weakness   2. Essential hypertension   3. Medication management   4. Morbid obesity (HCC)   5. OSA (obstructive sleep apnea)   6. Chronic respiratory failure with hypoxia and hypercapnia (HCC)   7. PAF (paroxysmal atrial fibrillation) (HCC)   8. Dilated cardiomyopathy (HCC)    PLAN:    In order of problems listed above:  Significant weakness: He did not take any of his blood pressure medications this morning, blood pressures in the 130s.  He has been having significant fatigue and has trouble picking things up.  We recommended he go to the hospital however he do not wish to.  We will reduce spironolactone to 25 mg daily and stop his amlodipine.  He will need 1 week follow-up  Hypertension: Blood pressure 130s even prior to him taking any blood pressure medication.  I suspect his recent weakness is related to hypotension, will stop amlodipine.  For some reason, his spironolactone was increased from 25 mg to 100 mg recently, we will reduce spironolactone down to 25 mg daily.  Obtain CMP, BNP, TSH and a CBC.  Morbid obesity: He is essentially wheelchair-bound and has extremely limited functional ability at home  Obstructive sleep apnea: Untreated.  Chronic respiratory failure with hypoxia and hypercapnia: He is on as needed oxygen at home.  When I first walked into the room, he is head was bobbing back-and-forth and that he seems to be altered.  As he was placed on oxygen, his mental status improved.  I emphasized the importance that he will need to be on 24/7 oxygen at home.  This was also discussed with Dr. Royann Shivers as well.  Obtain BNP.  Difficult to assess his volume status.  However he does not seems to be significantly volume overloaded.  We  are quite concerned about his current state, we initially recommended he go to the emergency room, however he did not wish to do so.  After discussing with MD, he was seen by Dr. Royann Shivers, we recommended follow-up within 1 week.  We have very low threshold of sending him to the hospital.  PAF: Continue Eliquis.  Maintaining sinus rhythm.  Dilated cardiomyopathy with improved EF: Difficult to assess volume status due to morbid obesity.  However he does not appears to be significantly volume overloaded.  He is on oxygen at home.          Medication Adjustments/Labs and Tests Ordered: Current medicines are reviewed at length with the patient today.  Concerns regarding medicines are outlined above.  Orders Placed This Encounter  Procedures   Comprehensive metabolic panel   Brain natriuretic peptide   TSH   CBC   EKG 12-Lead   Meds ordered this encounter  Medications   hydrALAZINE (APRESOLINE) 50 MG tablet    Sig: Take 0.5 tablets (25 mg total) by mouth 3 (three) times daily.    Dispense:  90 tablet    Refill:  3   spironolactone (ALDACTONE) 25 MG tablet    Sig: Take 1 tablet (25 mg total) by mouth daily.    Dispense:  30 tablet    Refill:  3    Patient Instructions  Medication Instructions:  STOP AMLODIPINE  INCREASE HYDRALIZINE 25NG THREE TIMES DAILY  DECREASE SPIRONOLACTONE 2MG  DAILY  *If you need a refill on your cardiac medications before your next appointment, please call your pharmacy*  Lab Work:    CMP, BNP,TSH AND CBC TODAY      Special Instructions MAKE SURE TO WEAR OXYDEN ALL THE TIME 24/7  Follow-Up: Your next appointment:  [place]  In Person with Sanda Klein, MD or Almyra Deforest, PA-C   At [facility], you and your health needs are our priority.  As part of our continuing mission to provide you with exceptional heart care, we have created designated Provider Care Teams.  These Care Teams include your primary Cardiologist  (physician) and Advanced Practice Providers (APPs -  Physician Assistants and Nurse Practitioners) who all work together to provide you with the care you need, when you need it.  We recommend signing up for the patient portal called "MyChart".  Sign up information is provided on this After Visit Summary.  MyChart is used to connect with patients for Virtual Visits (Telemedicine).  Patients are able to view lab/test results, encounter notes, upcoming appointments, etc.  Non-urgent messages can be sent to your provider as well.   To learn more about what you can do with MyChart, go to [email or web address].      Zachary Hawkins, [place]  03/11/2021 8:19 PM    [facility]

## 2021-03-09 NOTE — Patient Instructions (Signed)
Medication Instructions:  STOP AMLODIPINE  INCREASE HYDRALIZINE 25NG THREE TIMES DAILY  DECREASE SPIRONOLACTONE 2MG  DAILY  *If you need a refill on your cardiac medications before your next appointment, please call your pharmacy*  Lab Work:    CMP, BNP,TSH AND CBC TODAY      Special Instructions MAKE SURE TO WEAR OXYDEN ALL THE TIME 24/7  Follow-Up: Your next appointment:  1 WEEK  WE WILL CALL TO SCHEDULE  In Person with , MD or Thurmon Fair, PA-C   At [facility], you and your health needs are our priority.  As part of our continuing mission to provide you with exceptional heart care, we have created designated Provider Care Teams.  These Care Teams include your primary Cardiologist (physician) and Advanced Practice Providers (APPs -  Physician Assistants and Nurse Practitioners) who all work together to provide you with the care you need, when you need it.  We recommend signing up for the patient portal called "MyChart".  Sign up information is provided on this After Visit Summary.  MyChart is used to connect with patients for Virtual Visits (Telemedicine).  Patients are able to view lab/test results, encounter notes, upcoming appointments, etc.  Non-urgent messages can be sent to your provider as well.   To learn more about what you can do with MyChart, go to [facility].

## 2021-03-10 ENCOUNTER — Emergency Department (HOSPITAL_COMMUNITY): Payer: Medicaid Other

## 2021-03-10 ENCOUNTER — Encounter (HOSPITAL_COMMUNITY): Payer: Self-pay | Admitting: Emergency Medicine

## 2021-03-10 ENCOUNTER — Inpatient Hospital Stay (HOSPITAL_COMMUNITY)
Admission: EM | Admit: 2021-03-10 | Discharge: 2021-03-16 | DRG: 208 | Disposition: A | Discharge status: Home / Self Care | Payer: Medicaid Other | Attending: Internal Medicine | Admitting: Internal Medicine

## 2021-03-10 ENCOUNTER — Other Ambulatory Visit: Payer: Self-pay

## 2021-03-10 DIAGNOSIS — N179 Acute kidney failure, unspecified: Secondary | ICD-10-CM | POA: Diagnosis present

## 2021-03-10 DIAGNOSIS — J9602 Acute respiratory failure with hypercapnia: Secondary | ICD-10-CM | POA: Diagnosis not present

## 2021-03-10 DIAGNOSIS — Z6841 Body Mass Index (BMI) 40.0 and over, adult: Secondary | ICD-10-CM

## 2021-03-10 DIAGNOSIS — N1831 Chronic kidney disease, stage 3a: Secondary | ICD-10-CM | POA: Diagnosis present

## 2021-03-10 DIAGNOSIS — I130 Hypertensive heart and chronic kidney disease with heart failure and stage 1 through stage 4 chronic kidney disease, or unspecified chronic kidney disease: Secondary | ICD-10-CM | POA: Diagnosis present

## 2021-03-10 DIAGNOSIS — K219 Gastro-esophageal reflux disease without esophagitis: Secondary | ICD-10-CM | POA: Diagnosis present

## 2021-03-10 DIAGNOSIS — J9622 Acute and chronic respiratory failure with hypercapnia: Secondary | ICD-10-CM | POA: Diagnosis present

## 2021-03-10 DIAGNOSIS — J9621 Acute and chronic respiratory failure with hypoxia: Secondary | ICD-10-CM | POA: Diagnosis present

## 2021-03-10 DIAGNOSIS — J9601 Acute respiratory failure with hypoxia: Secondary | ICD-10-CM | POA: Diagnosis present

## 2021-03-10 DIAGNOSIS — Z01818 Encounter for other preprocedural examination: Secondary | ICD-10-CM | POA: Diagnosis not present

## 2021-03-10 DIAGNOSIS — Z4659 Encounter for fitting and adjustment of other gastrointestinal appliance and device: Secondary | ICD-10-CM | POA: Diagnosis not present

## 2021-03-10 DIAGNOSIS — J441 Chronic obstructive pulmonary disease with (acute) exacerbation: Secondary | ICD-10-CM | POA: Diagnosis present

## 2021-03-10 DIAGNOSIS — Z7901 Long term (current) use of anticoagulants: Secondary | ICD-10-CM | POA: Diagnosis not present

## 2021-03-10 DIAGNOSIS — J9811 Atelectasis: Secondary | ICD-10-CM | POA: Diagnosis present

## 2021-03-10 DIAGNOSIS — Z597 Insufficient social insurance and welfare support: Secondary | ICD-10-CM

## 2021-03-10 DIAGNOSIS — I5022 Chronic systolic (congestive) heart failure: Secondary | ICD-10-CM | POA: Diagnosis present

## 2021-03-10 DIAGNOSIS — F17210 Nicotine dependence, cigarettes, uncomplicated: Secondary | ICD-10-CM | POA: Diagnosis present

## 2021-03-10 DIAGNOSIS — G4733 Obstructive sleep apnea (adult) (pediatric): Secondary | ICD-10-CM | POA: Diagnosis present

## 2021-03-10 DIAGNOSIS — Z20822 Contact with and (suspected) exposure to covid-19: Secondary | ICD-10-CM | POA: Diagnosis present

## 2021-03-10 DIAGNOSIS — I509 Heart failure, unspecified: Secondary | ICD-10-CM | POA: Diagnosis not present

## 2021-03-10 DIAGNOSIS — I10 Essential (primary) hypertension: Secondary | ICD-10-CM | POA: Diagnosis not present

## 2021-03-10 DIAGNOSIS — Z833 Family history of diabetes mellitus: Secondary | ICD-10-CM

## 2021-03-10 DIAGNOSIS — I5032 Chronic diastolic (congestive) heart failure: Secondary | ICD-10-CM

## 2021-03-10 DIAGNOSIS — I4891 Unspecified atrial fibrillation: Secondary | ICD-10-CM | POA: Diagnosis present

## 2021-03-10 LAB — CBC WITH DIFFERENTIAL/PLATELET
Abs Immature Granulocytes: 0.24 10*3/uL — ABNORMAL HIGH (ref 0.00–0.07)
Basophils Absolute: 0.1 10*3/uL (ref 0.0–0.1)
Basophils Relative: 1 %
Eosinophils Absolute: 0.4 10*3/uL (ref 0.0–0.5)
Eosinophils Relative: 4 %
HCT: 46.3 % (ref 39.0–52.0)
Hemoglobin: 14 g/dL (ref 13.0–17.0)
Immature Granulocytes: 3 %
Lymphocytes Relative: 27 %
Lymphs Abs: 2.3 10*3/uL (ref 0.7–4.0)
MCH: 29 pg (ref 26.0–34.0)
MCHC: 30.2 g/dL (ref 30.0–36.0)
MCV: 96.1 fL (ref 80.0–100.0)
Monocytes Absolute: 1.2 10*3/uL — ABNORMAL HIGH (ref 0.1–1.0)
Monocytes Relative: 14 %
Neutro Abs: 4.3 10*3/uL (ref 1.7–7.7)
Neutrophils Relative %: 51 %
Platelets: 208 10*3/uL (ref 150–400)
RBC: 4.82 MIL/uL (ref 4.22–5.81)
RDW: 16 % — ABNORMAL HIGH (ref 11.5–15.5)
WBC: 8.5 10*3/uL (ref 4.0–10.5)
nRBC: 0.6 % — ABNORMAL HIGH (ref 0.0–0.2)

## 2021-03-10 LAB — I-STAT ARTERIAL BLOOD GAS, ED
Acid-Base Excess: 13 mmol/L — ABNORMAL HIGH (ref 0.0–2.0)
Bicarbonate: 44.1 mmol/L — ABNORMAL HIGH (ref 20.0–28.0)
Calcium, Ion: 1.24 mmol/L (ref 1.15–1.40)
HCT: 45 % (ref 39.0–52.0)
Hemoglobin: 15.3 g/dL (ref 13.0–17.0)
O2 Saturation: 97 %
Patient temperature: 98.8
Potassium: 4 mmol/L (ref 3.5–5.1)
Sodium: 138 mmol/L (ref 135–145)
TCO2: 47 mmol/L — ABNORMAL HIGH (ref 22–32)
pCO2 arterial: 91.1 mmHg (ref 32.0–48.0)
pH, Arterial: 7.293 — ABNORMAL LOW (ref 7.350–7.450)
pO2, Arterial: 109 mmHg — ABNORMAL HIGH (ref 83.0–108.0)

## 2021-03-10 LAB — LACTIC ACID, PLASMA
Lactic Acid, Venous: 0.9 mmol/L (ref 0.5–1.9)
Lactic Acid, Venous: 1.2 mmol/L (ref 0.5–1.9)

## 2021-03-10 LAB — TROPONIN I (HIGH SENSITIVITY)
Troponin I (High Sensitivity): 26 ng/L — ABNORMAL HIGH
Troponin I (High Sensitivity): 26 ng/L — ABNORMAL HIGH

## 2021-03-10 LAB — BASIC METABOLIC PANEL
Anion gap: 7 (ref 5–15)
BUN: 18 mg/dL (ref 8–23)
CO2: 39 mmol/L — ABNORMAL HIGH (ref 22–32)
Calcium: 8.8 mg/dL — ABNORMAL LOW (ref 8.9–10.3)
Chloride: 94 mmol/L — ABNORMAL LOW (ref 98–111)
Creatinine, Ser: 1.38 mg/dL — ABNORMAL HIGH (ref 0.61–1.24)
GFR, Estimated: 57 mL/min — ABNORMAL LOW (ref >60)
Glucose, Bld: 112 mg/dL — ABNORMAL HIGH (ref 70–99)
Potassium: 3.9 mmol/L (ref 3.5–5.1)
Sodium: 140 mmol/L (ref 135–145)

## 2021-03-10 LAB — BRAIN NATRIURETIC PEPTIDE: B Natriuretic Peptide: 28.6 pg/mL (ref 0.0–100.0)

## 2021-03-10 LAB — RESP PANEL BY RT-PCR (FLU A&B, COVID) ARPGX2
Influenza A by PCR: NEGATIVE
Influenza B by PCR: NEGATIVE
SARS Coronavirus 2 by RT PCR: NEGATIVE

## 2021-03-10 MED ORDER — POLYETHYLENE GLYCOL 3350 17 G PO PACK: 17.0000 g | PACK | Freq: Every day | ORAL | Status: DC | PRN

## 2021-03-10 MED ORDER — FAMOTIDINE 20 MG PO TABS
20.0000 mg | ORAL_TABLET | Freq: Two times a day (BID) | ORAL | Status: DC
  Administered 2021-03-10: 20 mg via ORAL
  Filled 2021-03-10: qty 1

## 2021-03-10 MED ORDER — FUROSEMIDE 10 MG/ML IJ SOLN
80.0000 mg | Freq: Once | INTRAMUSCULAR | Status: AC
  Administered 2021-03-10: 80 mg via INTRAVENOUS
  Filled 2021-03-10: qty 8

## 2021-03-10 MED ORDER — DOCUSATE SODIUM 100 MG PO CAPS: 100.0000 mg | ORAL_CAPSULE | Freq: Two times a day (BID) | ORAL | Status: DC | PRN

## 2021-03-10 MED ORDER — PREDNISONE 20 MG PO TABS: 40.0000 mg | ORAL_TABLET | Freq: Every day | ORAL | Status: DC

## 2021-03-10 MED ORDER — TAMSULOSIN HCL 0.4 MG PO CAPS: 0.4000 mg | ORAL_CAPSULE | Freq: Every day | ORAL | Status: DC

## 2021-03-10 MED ORDER — ARFORMOTEROL TARTRATE 15 MCG/2ML IN NEBU
15.0000 ug | INHALATION_SOLUTION | Freq: Two times a day (BID) | RESPIRATORY_TRACT | Status: DC
  Administered 2021-03-11 – 2021-03-13 (×5): 15 ug via RESPIRATORY_TRACT
  Filled 2021-03-10 (×5): qty 2

## 2021-03-10 MED ORDER — IPRATROPIUM-ALBUTEROL 0.5-2.5 (3) MG/3ML IN SOLN: 3.0000 mL | Freq: Four times a day (QID) | RESPIRATORY_TRACT | Status: DC | PRN

## 2021-03-10 MED ORDER — ENOXAPARIN SODIUM 40 MG/0.4ML IJ SOSY: 40.0000 mg | PREFILLED_SYRINGE | INTRAMUSCULAR | Status: DC

## 2021-03-10 MED ORDER — IPRATROPIUM BROMIDE 0.02 % IN SOLN
0.5000 mg | Freq: Four times a day (QID) | RESPIRATORY_TRACT | Status: DC
  Administered 2021-03-11 – 2021-03-12 (×6): 0.5 mg via RESPIRATORY_TRACT
  Filled 2021-03-10 (×6): qty 2.5

## 2021-03-10 MED ORDER — ISOSORBIDE DINITRATE 10 MG PO TABS
20.0000 mg | ORAL_TABLET | Freq: Three times a day (TID) | ORAL | Status: DC
  Filled 2021-03-10: qty 1

## 2021-03-10 MED ORDER — APIXABAN 5 MG PO TABS
5.0000 mg | ORAL_TABLET | Freq: Two times a day (BID) | ORAL | Status: DC
  Administered 2021-03-10: 5 mg via ORAL
  Filled 2021-03-10: qty 1

## 2021-03-10 MED ORDER — BUDESONIDE 0.25 MG/2ML IN SUSP
0.2500 mg | Freq: Two times a day (BID) | RESPIRATORY_TRACT | Status: DC
  Administered 2021-03-11 – 2021-03-12 (×3): 0.25 mg via RESPIRATORY_TRACT
  Filled 2021-03-10 (×3): qty 2

## 2021-03-10 NOTE — Progress Notes (Signed)
Prior to transporting pt. To 2H, pt. Took off his bipap mask and refused to let staff place it back on. Pt. Now in room 2H 22 and is wearing nasal cannula. Report given to RT for that unit.

## 2021-03-10 NOTE — Plan of Care (Signed)
  Problem: Education: Goal: Knowledge of General Education information will improve Description: Including pain rating scale, medication(s)/side effects and non-pharmacologic comfort measures Outcome: Progressing   Problem: Health Behavior/Discharge Planning: Goal: Ability to manage health-related needs will improve Outcome: Progressing   Problem: Clinical Measurements: Goal: Respiratory complications will improve Outcome: Progressing   

## 2021-03-10 NOTE — Progress Notes (Signed)
eLink Physician-Brief Progress Note Patient Name: Zachary Hawkins DOB: [birth year] MRN: [id number]   Date of Service  03/10/2021  HPI/Events of Note  Pt in ICU; is off BiPAP so he can make calls to his family; is on [facility].  Awake/calm.  RN's are in the room.  eICU Interventions  - pt to go back on BiPAP shortly - hemodynamically table      Intervention Category Evaluation Type: New Patient Evaluation  Jacinta Shoe 03/10/2021, 10:50 PM

## 2021-03-10 NOTE — ED Notes (Signed)
Report given to [facility] RN - [id number].

## 2021-03-10 NOTE — ED Provider Notes (Signed)
[facility] EMERGENCY DEPARTMENT Provider Note   CSN: [id number] Arrival date & time: 03/10/21  1954     History Chief Complaint  Patient presents with   Respiratory Distress    Zachary Hawkins is a 65 y.o. male.  Pt presents to the ED today with sob.  The pt is not a very good historian now because he is so sob.  However, he did go to his cardiologist yesterday.  They noted that his weight has increased from last visit.  His O2 sats were 70s to 80s on RA, but did come up with his portable oxygen tank which he uses prn.  Pt was sleepy and dozing off in the visit.  Pt was recommended to come to the hospital for admission.  However, he did not want to go.  Pt's portable oxygen tank was not working today.  He called his community Engineer, maintenance (IT) who saw pt and then called EMS.  Pt was saturating in the low 80s on RA and struggling to breathe.  Pt was given 2 g magnesium, duoneb, and placed on cpap.  EMS said their capnography was reading in the upper 70s prior to placing the cpap.  Pt denies f/c.  No cp.      Past Medical History:  Diagnosis Date   Cardiomyopathy ([place]) 06/2017   EF 40-45%   Chronic renal insufficiency, stage 3 (moderate) (HCC)    Diastolic dysfunction [id number]   grade 2    Hypertension    Hypertensive urgency    Obesity     Patient Active Problem List   Diagnosis Date Noted   Acute respiratory failure with hypoxia ([place]) 03/10/2021   Urticaria 01/15/2021   Chronic heart failure with preserved ejection fraction (HCC)    Angioedema 01/04/2021   Influenza vaccine refused 06/26/2020   COVID-19 vaccine series completed 06/26/2020   Paroxysmal atrial fibrillation ([place]) 07/02/2019   Secondary hypercoagulable state ([place]) 07/02/2019   OSA (obstructive sleep apnea) 04/03/2019   History of angioedema due to ACE INHIBITORS 10/13/2017   Non compliance w medication regimen [id number]   Systolic and diastolic CHF, chronic ([place]) 06/23/2017   Essential  hypertension 06/23/2017   Obesity with serious comorbidity    CKD (chronic kidney disease) stage 3, GFR 30-59 ml/min ([place])    Cardiomyopathy ([place]) 06/06/2017   Dyspnea 06/05/2017    History reviewed. No pertinent surgical history.     Family History  Problem Relation Age of Onset   Diabetes Mother    Diabetes Father     Social History   Tobacco Use   Smoking status: Every Day    Packs/day: 1.00    Types: Cigarettes   Smokeless tobacco: Never   Tobacco comments:    1 pack last 2-3 days- states stopped 01/04/21.  Substance Use Topics   Alcohol use: Yes    Alcohol/week: 1.0 standard drink    Types: 1 Shots of liquor per week    Comment: socially   Drug use: Yes    Types: Marijuana    Comment: every other week    Home Medications Prior to Admission medications   Medication Sig Start Date End Date Taking? Authorizing Provider  ADVAIR HFA 115-21 MCG/ACT inhaler INHALE 2 PUFFS INTO THE LUNGS 2 (TWO) TIMES DAILY. 07/23/20   Charlott Rakes, MD  albuterol (VENTOLIN HFA) 108 (90 Base) MCG/ACT inhaler Inhale 2 puffs into the lungs every 4 (four) hours as needed for wheezing or shortness of breath. 02/13/21   Croitoru, Benoit,  MD  apixaban (ELIQUIS) 5 MG TABS tablet Take 1 tablet (5 mg total) by mouth 2 (two) times daily. 01/09/21 04/09/21  Osvaldo Shipper, MD  atorvastatin (LIPITOR) 10 MG tablet TAKE 1 TABLET (10 MG TOTAL) BY MOUTH DAILY. Patient taking differently: Take 10 mg by mouth daily. 11/06/19 03/09/21  Abelino Derrick, PA-C  bisoprolol (ZEBETA) 5 MG tablet Take 1 tablet (5 mg total) by mouth daily. 02/13/21   Croitoru, Mihai, MD  EPINEPHrine 0.3 mg/0.3 mL IJ SOAJ injection Inject 0.3 mg into the muscle as needed for anaphylaxis. 01/09/21   Osvaldo Shipper, MD  famotidine (PEPCID) 20 MG tablet Take 1 tablet (20 mg total) by mouth 2 (two) times daily. 01/17/21 01/17/22  Karie Fetch P, DO  fluticasone (FLONASE) 50 MCG/ACT nasal spray PLACE 2 SPRAYS INTO BOTH NOSTRILS DAILY. Patient  taking differently: Place 2 sprays into both nostrils daily as needed for allergies. 06/26/20 06/26/21  Marcine Matar, MD  furosemide (LASIX) 80 MG tablet Take 1 tablet (80 mg total) by mouth daily. 02/13/21 02/13/22  Croitoru, Mihai, MD  GENTLE LAXATIVE 10 MG suppository Place 10 mg rectally daily as needed. 01/14/21   [provider]  hydrALAZINE (APRESOLINE) 50 MG tablet Take 0.5 tablets (25 mg total) by mouth 3 (three) times daily. 03/09/21 03/09/22  Azalee Course, PA  isosorbide dinitrate (ISORDIL) 20 MG tablet TAKE 1 TABLET (20 MG TOTAL) BY MOUTH 3 (THREE) TIMES DAILY. SCHEDULE OFFICE VISIT Patient taking differently: Take 20 mg by mouth 3 (three) times daily. 10/17/20 10/17/21  Arthor Captain, PA-C  loratadine (CLARITIN) 10 MG tablet Take 1 tablet (10 mg total) by mouth daily. 01/09/21 03/09/21  Osvaldo Shipper, MD  Multiple Vitamin (MULTIVITAMIN WITH MINERALS) TABS tablet Take 1 tablet by mouth daily. 01/10/21   Osvaldo Shipper, MD  polyethylene glycol (MIRALAX / GLYCOLAX) 17 g packet Take 17 g by mouth daily as needed for moderate constipation. 01/09/21   Osvaldo Shipper, MD  predniSONE (DELTASONE) 20 MG tablet Take 2 tablets (40 mg total) by mouth daily with breakfast. Take 40mg  ( 2 tabs) daily for 5 days, then 20mg  (1 tab) daily for 5 days, then 10mg  (half tab) daily for 5 days. 01/18/21   , DO  sodium chloride (OCEAN) 0.65 % SOLN nasal spray Place 2 sprays into both nostrils 3 (three) times daily. Patient taking differently: Place 2 sprays into both nostrils as needed for congestion. 04/18/19   03/20/21, MD  spironolactone (ALDACTONE) 25 MG tablet Take 1 tablet (25 mg total) by mouth daily. 03/09/21 06/07/21  Storm Frisk, PA  tamsulosin (FLOMAX) 0.4 MG CAPS capsule Take 1 capsule (0.4 mg total) by mouth daily after supper. 01/09/21   06/09/21, MD    Allergies    Ace inhibitors, Black cherry fruit extract Azalee Course extract], and [place] flavor [flavoring  agent]  Review of Systems   Review of Systems  Respiratory:  Positive for shortness of breath.   Cardiovascular:  Positive for leg swelling.  All other systems reviewed and are negative.  Physical Exam Updated Vital Signs BP (!) 156/103   Pulse 97   Temp 98.8 F (37.1 C) (Axillary)   Resp 12   Ht 6\' 1"  (1.854 m)   Wt (!) 160 kg   SpO2 99%   BMI 46.54 kg/m   Physical Exam Vitals and nursing note reviewed.  Constitutional:      General: He is not in acute distress.    Appearance: Normal appearance. He is ill-appearing.  HENT:     Head: Normocephalic and atraumatic.     Right Ear: External ear normal.     Left Ear: External ear normal.     Nose: Nose normal.     Mouth/Throat:     Mouth: Mucous membranes are dry.  Eyes:     Extraocular Movements: Extraocular movements intact.     Conjunctiva/sclera: Conjunctivae normal.     Pupils: Pupils are equal, round, and reactive to light.  Cardiovascular:     Rate and Rhythm: Regular rhythm. Tachycardia present.     Pulses: Normal pulses.     Heart sounds: Normal heart sounds.  Pulmonary:     Effort: Respiratory distress present.     Breath sounds: Rhonchi present.  Abdominal:     General: Abdomen is flat. Bowel sounds are normal.     Palpations: Abdomen is soft.  Musculoskeletal:     Cervical back: Normal range of motion and neck supple.     Right lower leg: Edema present.     Left lower leg: Edema present.  Skin:    General: Skin is warm.     Capillary Refill: Capillary refill takes less than 2 seconds.  Neurological:     General: No focal deficit present.     Mental Status: He is alert and oriented to person, place, and time.  Psychiatric:        Mood and Affect: Mood normal.        Behavior: Behavior normal.    ED Results / Procedures / Treatments   Labs (all labs ordered are listed, but only abnormal results are displayed) Labs Reviewed  BASIC METABOLIC PANEL - Abnormal; Notable for the following components:       Result Value   Chloride 94 (*)    CO2 39 (*)    Glucose, Bld 112 (*)    Creatinine, Ser 1.38 (*)    Calcium 8.8 (*)    GFR, Estimated 57 (*)    All other components within normal limits  CBC WITH DIFFERENTIAL/PLATELET - Abnormal; Notable for the following components:   RDW 16.0 (*)    nRBC 0.6 (*)    Monocytes Absolute 1.2 (*)    Abs Immature Granulocytes 0.24 (*)    All other components within normal limits  I-STAT ARTERIAL BLOOD GAS, ED - Abnormal; Notable for the following components:   pH, Arterial 7.293 (*)    pCO2 arterial 91.1 (*)    pO2, Arterial 109 (*)    Bicarbonate 44.1 (*)    TCO2 47 (*)    Acid-Base Excess 13.0 (*)    All other components within normal limits  TROPONIN I (HIGH SENSITIVITY) - Abnormal; Notable for the following components:   Troponin I (High Sensitivity) 26 (*)    All other components within normal limits  RESP PANEL BY RT-PCR (FLU A&B, COVID) ARPGX2  CULTURE, BLOOD (ROUTINE X 2)  CULTURE, BLOOD (ROUTINE X 2)  BRAIN NATRIURETIC PEPTIDE  LACTIC ACID, PLASMA  LACTIC ACID, PLASMA  LEGIONELLA PNEUMOPHILA SEROGP 1 UR AG  STREP PNEUMONIAE URINARY ANTIGEN  URINALYSIS, ROUTINE W REFLEX MICROSCOPIC  CBC  BASIC METABOLIC PANEL  BLOOD GAS, VENOUS  TROPONIN I (HIGH SENSITIVITY)    EKG EKG Interpretation  Date/Time:  Tuesday March 10 2021 20:08:45 EDT Ventricular Rate:  97 PR Interval:  154 QRS Duration: 86 QT Interval:  295 QTC Calculation: 375 R Axis:   8 Text Interpretation: Sinus rhythm Probable left atrial enlargement Anteroseptal infarct, old Minimal ST elevation, lateral  leads Confirmed by Isla Pence [id number]) on 03/10/2021 8:13:57 PM  Radiology DG Chest Port 1 View  Result Date: 03/10/2021 CLINICAL DATA:  Shortness of breath. EXAM: PORTABLE CHEST 1 VIEW COMPARISON:  Chest radiograph dated 02/12/2021. FINDINGS: There is cardiomegaly with vascular congestion and mild edema. Bibasilar atelectasis. Pneumonia is not excluded. No  pleural effusion pneumothorax. No acute osseous pathology. IMPRESSION: Cardiomegaly with findings of CHF. Pneumonia is not excluded. Electronically Signed   By: Anner Crete M.D.   On: 03/10/2021 20:47    Procedures Procedures   Medications Ordered in ED Medications  docusate sodium (COLACE) capsule 100 mg (has no administration in time range)  polyethylene glycol (MIRALAX / GLYCOLAX) packet 17 g (has no administration in time range)  enoxaparin (LOVENOX) injection 40 mg (has no administration in time range)  ipratropium-albuterol (DUONEB) 0.5-2.5 (3) MG/3ML nebulizer solution 3 mL (has no administration in time range)  ipratropium-albuterol (DUONEB) 0.5-2.5 (3) MG/3ML nebulizer solution 3 mL (has no administration in time range)  furosemide (LASIX) injection 80 mg (80 mg Intravenous Given 03/10/21 2035)    ED Course  I have reviewed the triage vital signs and the nursing notes.  Pertinent labs & imaging results that were available during my care of the patient were reviewed by me and considered in my medical decision making (see chart for details).    MDM Rules/Calculators/A&P                           Pt is in CHF with hypercarbic and hypoxic resp failure.  Pt placed on bipap upon arrival.  He was given lasix IV.  ABG done about 1 hr after bipap.  His pCO2 is elevated to 90.  pH is 7.295.  He likely has chronically elevated pCO2s, but is more than normal.  He is very sleepy, but will wake up.  He is d/w CCM (Dr. Hardin Negus) for admission due to the possible need for intubation later.  I don't think he needs intubation now.  Zachary Hawkins was evaluated in Emergency Department on 03/10/2021 for the symptoms described in the history of present illness. He was evaluated in the context of the global COVID-19 pandemic, which necessitated consideration that the patient might be at risk for infection with the SARS-CoV-2 virus that causes COVID-19. Institutional protocols and algorithms that  pertain to the evaluation of patients at risk for COVID-19 are in a state of rapid change based on information released by regulatory bodies including the CDC and federal and state organizations. These policies and algorithms were followed during the patient's care in the ED.   CRITICAL CARE Performed by: Isla Pence   Total critical care time: 30 minutes  Critical care time was exclusive of separately billable procedures and treating other patients.  Critical care was necessary to treat or prevent imminent or life-threatening deterioration.  Critical care was time spent personally by me on the following activities: development of treatment plan with patient and/or surrogate as well as nursing, discussions with consultants, evaluation of patient's response to treatment, examination of patient, obtaining history from patient or surrogate, ordering and performing treatments and interventions, ordering and review of laboratory studies, ordering and review of radiographic studies, pulse oximetry and re-evaluation of patient's condition.   Final Clinical Impression(s) / ED Diagnoses Final diagnoses:  Acute on chronic congestive heart failure, unspecified heart failure type ([place])  Acute respiratory failure with hypoxia and hypercapnia ([place])    Rx / DC Orders  ED Discharge Orders     None        Isla Pence, MD 03/10/21 2152

## 2021-03-10 NOTE — ED Triage Notes (Signed)
Patient arrived with EMS from home on CPAP reports SOB with chest congestion this evening , received Magnesium 2g. IV , Albuterol/Atrovent nebulizer by EMS prior to arrival , placed on BIPAP by RT at arrival .

## 2021-03-10 NOTE — H&P (Signed)
NAME:  Zachary Hawkins, MRN:  [id number], DOB:  Apr 08, 1956, LOS: 0 ADMISSION DATE:  03/10/2021, CONSULTATION DATE:  03/10/2021 REFERRING MD:  Dr. , CHIEF COMPLAINT:  SOB  History of Present Illness:  65 yo M w/ a hx of Afib on Eliquis, TUD in remission, OSA not currently on PAP therapy due to cost, HFrEF recovered (60-65% Sept 2022), ACE-I angioedema 10/2017, recent admission in Aug for possible anaphylaxis who is admitted for hypercarbic respiratory failure. Pt was seen at his cardiogists office yesterday and was recommended to come to the hospital due to somnolence, he refused. However, today pt was at home and thinks that his o2 was malfunctioning so he called his community Engineer, maintenance (IT) who assessed the pt and called EMS. Pt was reportedly saturating into the 70s when EMS arrived and pt was placed on CPAP, transitioned to BIPAP in the ED. Pt was initially somnolent in the ED but able to be awoken to voice. ABG in the ED was 7.29/91.1/109.   Pertinent  Medical History  Afib on Eliquis TUD in remission OSA not currently on PAP therapy due to cost HFrEF recovered (now 60-65% Sept 2022) ACE-I angioedema 10/2017  Significant Hospital Events: Including procedures, antibiotic start and stop dates in addition to other pertinent events   Admitted 11/1  Interim History / Subjective:  Initially when assessing the pt and verifying he was oriented x3, the pt ignored this provider and pulled off his BIPAP, removed his condom catheter, BP cuff, and SP02 monitor and crossed the room to urinate into the garbage can. He did not appear to be significantly short of breath during this time, and returned to sit on the edge of the bed.   Pt notes that he has been having trouble "off and on" with breathing, was feeling short of breath earlier tonight. The pt was short and elected to minimally engage with this provider during the interview, answering questions in very short sentences or choosing not to answer.    Objective   Blood pressure (!) 156/103, pulse 97, temperature 98.8 F (37.1 C), temperature source Axillary, resp. rate 12, height 6\' 1"  (1.854 m), weight (!) 160 kg, SpO2 99 %.       No intake or output data in the 24 hours ending 03/10/21 2148 Filed Weights   03/10/21 2006  Weight: (!) 160 kg   Examination: Physical Exam Vitals and nursing note reviewed.  Constitutional:      Appearance: He is obese.  HENT:     Head: Normocephalic and atraumatic.     Mouth/Throat:     Mouth: Mucous membranes are dry.  Cardiovascular:     Rate and Rhythm: Normal rate and regular rhythm.  Pulmonary:     Effort: Pulmonary effort is normal. Prolonged expiration present. No accessory muscle usage or respiratory distress.     Breath sounds: Decreased air movement present. Examination of the right-middle field reveals decreased breath sounds. Examination of the left-middle field reveals decreased breath sounds. Examination of the right-lower field reveals decreased breath sounds and rhonchi. Examination of the left-lower field reveals decreased breath sounds and rhonchi. Decreased breath sounds and rhonchi present.  Abdominal:     General: Abdomen is flat and protuberant.     Palpations: Abdomen is soft.     Tenderness: There is no abdominal tenderness.     Comments: Morbidly obese  Musculoskeletal:     Cervical back: Normal range of motion and neck supple.  Skin:    General: Skin is  warm and dry.  Neurological:     Mental Status: He is oriented to person, place, and time. He is lethargic.     GCS: GCS eye subscore is 4. GCS verbal subscore is 5. GCS motor subscore is 6.     Motor: Motor function is intact.     Gait: Gait is intact.  Psychiatric:        Behavior: Behavior is uncooperative.        Judgment: Judgment is impulsive.   Resolved Hospital Problem list   N/a  Assessment & Plan:  Pt is a 65 yo M w/ a hx of Afib on Eliquis, TUD in remission, CKD3, HTN, OSA not currently on PAP  therapy due to cost, HFmEF recovered (from 40-45% in 2019 -> 60-65% Sept 2022), ACE-I angioedema 10/2017 who is admitted for acute on chronic hypercarbic respiratory failure.   #acute on chronic hypoxic and hypercarbic respiratory failure - multifactorial, 2/2 ADHF with some degree of increased volume at present, with a component of untreated OSA and underlying undocumented COPD. On intermittent prn O2 at home of unclear indication. Less suspicion for infectious etiology but will obtain a sputum culture if able and get an RVP. CXR shows bibasilar infiltrates that are likely volume/atelectasis.  - For now, exchange Advair to Pulmicort/Brovana/Ipratropium nebs  - Prednisone 40mg  for 5 days  - Duonebs prn   - Received Lasix 80mg  IV in ED, redose in early AM  - Cont BIPAP this evening as tolerated  - RVP, sputum culture if bringing up a good sample  - Made some changes to BIPAP in room, current settings 20/8, pt tolerating comfortably.   - Repeat VBG in 1 hr  #Probable ADHF - w/ hx of HFmEF recovered (from 40-45% in 2019 -> 60-65% Sept 2022). Recent TTE and no changes on EKG compared with that in Oct 2022, so no need for repeat TTE at this time.  - as above  #Probable COPD exacerbation  - undocumented COPD, smoking hx on advair/prn O2 as outpatient #Reactive airway disease - diagnosis noted in previous notes but unclear if pt really has COPD, pt on Advair as an outpatient but without PFTs, repeat ordered by outpt Pulmonary and are pending. Medication cost has been an issue at times getting his meds. Given smoking hx, physical exam, and chart notes, will treat as a COPD exacerbation.  - as above  #OSA - AHI of 63.5 in 2019, no PAP titration. not currently on PAP therapy due to cost/financial issues  - Pt will need nightly CPAP while in hospital once euvolemic and acute issues resolve  #HTN - previously on hydralazine (reduced by his cardiologist to 25mg  TID on 10/31), Amlodipine (stopped by his  cardiologist 10/31), isosorbide dinitrate 20mg  TID, Lasix 80mg  daily   - Cont Hydralazine for now, cont to hold other meds  #Afib on Eliquis  - cont home DOAC  #GERD - chronic, stable  - cont home famotidine   Best Practice (right click and "Reselect all SmartList Selections" daily)   Diet/type: Regular consistency (see orders) DVT prophylaxis: DOAC GI prophylaxis: H2B Lines: PIVs Foley:  N/A - condom cath Code Status:  full code Last date of multidisciplinary goals of care discussion [03/10/2021]  Labs   CBC: Recent Labs  Lab 03/10/21 2005 03/10/21 2116  WBC 8.5  --   NEUTROABS 4.3  --   HGB 14.0 15.3  HCT 46.3 45.0  MCV 96.1  --   PLT 208  --  Basic Metabolic Panel: Recent Labs  Lab 03/10/21 2005 03/10/21 2116  NA 140 138  K 3.9 4.0  CL 94*  --   CO2 39*  --   GLUCOSE 112*  --   BUN 18  --   CREATININE 1.38*  --   CALCIUM 8.8*  --    GFR: Estimated Creatinine Clearance: 86.7 mL/min (A) (by C-G formula based on SCr of 1.38 mg/dL (H)). Recent Labs  Lab 03/10/21 2005  WBC 8.5  LATICACIDVEN 0.9    Liver Function Tests: No results for input(s): AST, ALT, ALKPHOS, BILITOT, PROT, ALBUMIN in the last 168 hours. No results for input(s): LIPASE, AMYLASE in the last 168 hours. No results for input(s): AMMONIA in the last 168 hours.  ABG    Component Value Date/Time   PHART 7.293 (L) 03/10/2021 2116   PCO2ART 91.1 (HH) 03/10/2021 2116   PO2ART 109 (H) 03/10/2021 2116   HCO3 44.1 (H) 03/10/2021 2116   TCO2 47 (H) 03/10/2021 2116   O2SAT 97.0 03/10/2021 2116    Coagulation Profile: No results for input(s): INR, PROTIME in the last 168 hours.  Cardiac Enzymes: No results for input(s): CKTOTAL, CKMB, CKMBINDEX, TROPONINI in the last 168 hours.  HbA1C: No results found for: HGBA1C  CBG: No results for input(s): GLUCAP in the last 168 hours.  Review of Systems:   Review of Systems  Constitutional:  Negative for chills and fever.  HENT:   Negative for congestion.   Respiratory:  Positive for cough and shortness of breath. Negative for sputum production and wheezing.   Cardiovascular:  Positive for orthopnea and leg swelling. Negative for chest pain and palpitations.  Gastrointestinal:  Negative for abdominal pain, nausea and vomiting.  Genitourinary:  Positive for frequency.  ROS limited by pt cooperation  Past Medical History:  He,  has a past medical history of Cardiomyopathy ([place]) (06/2017), Chronic renal insufficiency, stage 3 (moderate) ([place]), Diastolic dysfunction ([id number]), Hypertension, Hypertensive urgency, and Obesity.   Surgical History:  History reviewed. No pertinent surgical history.   Social History:   reports that he has been smoking cigarettes. He has been smoking an average of 1 pack per day. He has never used smokeless tobacco. He reports current alcohol use of about 1.0 standard drink per week. He reports current drug use. Drug: Marijuana.   Family History:  His family history includes Diabetes in his father and mother.   Allergies Allergies  Allergen Reactions   Ace Inhibitors Anaphylaxis   Black Cherry Fruit Extract Marcelline Mates Extract] Anaphylaxis    Tongue   Grenadine Flavor [Flavoring Agent] Anaphylaxis    Home Medications  Prior to Admission medications   Medication Sig Start Date End Date Taking? Authorizing Provider  ADVAIR HFA 115-21 MCG/ACT inhaler INHALE 2 PUFFS INTO THE LUNGS 2 (TWO) TIMES DAILY. 07/23/20   Charlott Rakes, MD  albuterol (VENTOLIN HFA) 108 (90 Base) MCG/ACT inhaler Inhale 2 puffs into the lungs every 4 (four) hours as needed for wheezing or shortness of breath. 02/13/21   Croitoru, Mihai, MD  apixaban (ELIQUIS) 5 MG TABS tablet Take 1 tablet (5 mg total) by mouth 2 (two) times daily. 01/09/21 04/09/21  Bonnielee Haff, MD  atorvastatin (LIPITOR) 10 MG tablet TAKE 1 TABLET (10 MG TOTAL) BY MOUTH DAILY. Patient taking differently: Take 10 mg by mouth daily. 11/06/19 03/09/21   Erlene Quan, PA-C  bisoprolol (ZEBETA) 5 MG tablet Take 1 tablet (5 mg total) by mouth daily. 02/13/21   Croitoru, Dani Gobble, MD  EPINEPHrine 0.3 mg/0.3 mL IJ SOAJ injection Inject 0.3 mg into the muscle as needed for anaphylaxis. 01/09/21   Bonnielee Haff, MD  famotidine (PEPCID) 20 MG tablet Take 1 tablet (20 mg total) by mouth 2 (two) times daily. 01/17/21 01/17/22  Noemi Chapel P, DO  fluticasone (FLONASE) 50 MCG/ACT nasal spray PLACE 2 SPRAYS INTO BOTH NOSTRILS DAILY. Patient taking differently: Place 2 sprays into both nostrils daily as needed for allergies. 06/26/20 06/26/21  Ladell Pier, MD  furosemide (LASIX) 80 MG tablet Take 1 tablet (80 mg total) by mouth daily. 02/13/21 02/13/22  Croitoru, Mihai, MD  GENTLE LAXATIVE 10 MG suppository Place 10 mg rectally daily as needed. 01/14/21   [provider]  hydrALAZINE (APRESOLINE) 50 MG tablet Take 0.5 tablets (25 mg total) by mouth 3 (three) times daily. 03/09/21 03/09/22  Almyra Deforest, PA  isosorbide dinitrate (ISORDIL) 20 MG tablet TAKE 1 TABLET (20 MG TOTAL) BY MOUTH 3 (THREE) TIMES DAILY. SCHEDULE OFFICE VISIT Patient taking differently: Take 20 mg by mouth 3 (three) times daily. 10/17/20 10/17/21  Margarita Mail, PA-C  loratadine (CLARITIN) 10 MG tablet Take 1 tablet (10 mg total) by mouth daily. 01/09/21 03/09/21  Bonnielee Haff, MD  Multiple Vitamin (MULTIVITAMIN WITH MINERALS) TABS tablet Take 1 tablet by mouth daily. 01/10/21   Bonnielee Haff, MD  polyethylene glycol (MIRALAX / GLYCOLAX) 17 g packet Take 17 g by mouth daily as needed for moderate constipation. 01/09/21   Bonnielee Haff, MD  predniSONE (DELTASONE) 20 MG tablet Take 2 tablets (40 mg total) by mouth daily with breakfast. Take 40mg  ( 2 tabs) daily for 5 days, then 20mg  (1 tab) daily for 5 days, then 10mg  (half tab) daily for 5 days. 01/18/21   Julian Hy, DO  sodium chloride (OCEAN) 0.65 % SOLN nasal spray Place 2 sprays into both nostrils 3 (three) times daily. Patient  taking differently: Place 2 sprays into both nostrils as needed for congestion. 04/18/19   Elsie Stain, MD  spironolactone (ALDACTONE) 25 MG tablet Take 1 tablet (25 mg total) by mouth daily. 03/09/21 06/07/21  Almyra Deforest, PA  tamsulosin (FLOMAX) 0.4 MG CAPS capsule Take 1 capsule (0.4 mg total) by mouth daily after supper. 01/09/21   Bonnielee Haff, MD    Critical care time: 25

## 2021-03-11 ENCOUNTER — Encounter: Payer: Self-pay | Admitting: Physician Assistant

## 2021-03-11 ENCOUNTER — Inpatient Hospital Stay (HOSPITAL_COMMUNITY): Payer: Medicaid Other

## 2021-03-11 DIAGNOSIS — J9622 Acute and chronic respiratory failure with hypercapnia: Secondary | ICD-10-CM

## 2021-03-11 DIAGNOSIS — J9621 Acute and chronic respiratory failure with hypoxia: Secondary | ICD-10-CM | POA: Diagnosis not present

## 2021-03-11 LAB — BLOOD CULTURE ID PANEL (REFLEXED) - BCID2

## 2021-03-11 LAB — RESPIRATORY PANEL BY PCR

## 2021-03-11 LAB — POCT I-STAT EG7
Acid-Base Excess: 13 mmol/L — ABNORMAL HIGH (ref 0.0–2.0)
Bicarbonate: 45.4 mmol/L — ABNORMAL HIGH (ref 20.0–28.0)
Calcium, Ion: 1.22 mmol/L (ref 1.15–1.40)
HCT: 45 % (ref 39.0–52.0)
Hemoglobin: 15.3 g/dL (ref 13.0–17.0)
O2 Saturation: 63 %
Patient temperature: 98.1
Potassium: 4.3 mmol/L (ref 3.5–5.1)
Sodium: 140 mmol/L (ref 135–145)
TCO2: 48 mmol/L — ABNORMAL HIGH (ref 22–32)
pCO2, Ven: 95.4 mmHg (ref 44.0–60.0)
pH, Ven: 7.284 (ref 7.250–7.430)
pO2, Ven: 39 mmHg (ref 32.0–45.0)

## 2021-03-11 LAB — URINALYSIS, ROUTINE W REFLEX MICROSCOPIC
Bacteria, UA: NONE SEEN
Bilirubin Urine: NEGATIVE
Glucose, UA: NEGATIVE mg/dL
Ketones, ur: NEGATIVE mg/dL
Leukocytes,Ua: NEGATIVE
Nitrite: NEGATIVE
Protein, ur: NEGATIVE mg/dL
Specific Gravity, Urine: 1.008 (ref 1.005–1.030)
pH: 5 (ref 5.0–8.0)

## 2021-03-11 LAB — BLOOD GAS, VENOUS
Acid-Base Excess: 14.3 mmol/L — ABNORMAL HIGH (ref 0.0–2.0)
Acid-Base Excess: 14.2 mmol/L — ABNORMAL HIGH (ref 0.0–2.0)
Acid-Base Excess: 14.2 mmol/L — ABNORMAL HIGH (ref 0.0–2.0)
Acid-Base Excess: 13.8 mmol/L — ABNORMAL HIGH (ref 0.0–2.0)
Acid-Base Excess: 9.2 mmol/L — ABNORMAL HIGH (ref 0.0–2.0)
Bicarbonate: 41.7 mmol/L — ABNORMAL HIGH (ref 20.0–28.0)
Bicarbonate: 41.1 mmol/L — ABNORMAL HIGH (ref 20.0–28.0)
Bicarbonate: 40.1 mmol/L — ABNORMAL HIGH (ref 20.0–28.0)
Bicarbonate: 36.8 mmol/L — ABNORMAL HIGH (ref 20.0–28.0)
Bicarbonate: 32.4 mmol/L — ABNORMAL HIGH (ref 20.0–28.0)
FIO2: 21
FIO2: 50
FIO2: 40
O2 Saturation: 96 %
O2 Saturation: 79.9 %
O2 Saturation: 65.8 %
O2 Saturation: 76.3 %
O2 Saturation: 88.4 %
Patient temperature: 37
Patient temperature: 37
Patient temperature: 37
Patient temperature: 37
Patient temperature: 37
pCO2, Ven: 94.8 mmHg (ref 44.0–60.0)
pCO2, Ven: 86.9 mmHg (ref 44.0–60.0)
pCO2, Ven: 71.1 mmHg (ref 44.0–60.0)
pCO2, Ven: 34.8 mmHg — ABNORMAL LOW (ref 44.0–60.0)
pCO2, Ven: 36.8 mmHg — ABNORMAL LOW (ref 44.0–60.0)
pH, Ven: 7.266 (ref 7.250–7.430)
pH, Ven: 7.296 (ref 7.250–7.430)
pH, Ven: 7.37 (ref 7.250–7.430)
pH, Ven: 7.627 (ref 7.250–7.430)
pH, Ven: 7.553 — ABNORMAL HIGH (ref 7.250–7.430)
pO2, Ven: 92.2 mmHg — ABNORMAL HIGH (ref 32.0–45.0)
pO2, Ven: 48.8 mmHg — ABNORMAL HIGH (ref 32.0–45.0)
pO2, Ven: 35.6 mmHg (ref 32.0–45.0)
pO2, Ven: 32.3 mmHg (ref 32.0–45.0)
pO2, Ven: 46.4 mmHg — ABNORMAL HIGH (ref 32.0–45.0)

## 2021-03-11 LAB — BASIC METABOLIC PANEL
Anion gap: 9 (ref 5–15)
Anion gap: 11 (ref 5–15)
BUN: 22 mg/dL (ref 8–23)
BUN: 23 mg/dL (ref 8–23)
CO2: 36 mmol/L — ABNORMAL HIGH (ref 22–32)
CO2: 28 mmol/L (ref 22–32)
Calcium: 8.6 mg/dL — ABNORMAL LOW (ref 8.9–10.3)
Calcium: 9.2 mg/dL (ref 8.9–10.3)
Chloride: 91 mmol/L — ABNORMAL LOW (ref 98–111)
Chloride: 98 mmol/L (ref 98–111)
Creatinine, Ser: 1.31 mg/dL — ABNORMAL HIGH (ref 0.61–1.24)
Creatinine, Ser: 1.72 mg/dL — ABNORMAL HIGH (ref 0.61–1.24)
GFR, Estimated: >60 mL/min (ref >60)
GFR, Estimated: 44 mL/min — ABNORMAL LOW (ref >60)
Glucose, Bld: 123 mg/dL — ABNORMAL HIGH (ref 70–99)
Glucose, Bld: 141 mg/dL — ABNORMAL HIGH (ref 70–99)
Potassium: 3.9 mmol/L (ref 3.5–5.1)
Potassium: 4.6 mmol/L (ref 3.5–5.1)
Sodium: 136 mmol/L (ref 135–145)
Sodium: 137 mmol/L (ref 135–145)

## 2021-03-11 LAB — CBC
HCT: 45.9 % (ref 39.0–52.0)
Hemoglobin: 14 g/dL (ref 13.0–17.0)
MCH: 29.1 pg (ref 26.0–34.0)
MCHC: 30.5 g/dL (ref 30.0–36.0)
MCV: 95.4 fL (ref 80.0–100.0)
Platelets: 198 10*3/uL (ref 150–400)
RBC: 4.81 MIL/uL (ref 4.22–5.81)
RDW: 15.9 % — ABNORMAL HIGH (ref 11.5–15.5)
WBC: 7.1 10*3/uL (ref 4.0–10.5)
nRBC: 0.6 % — ABNORMAL HIGH (ref 0.0–0.2)

## 2021-03-11 LAB — GLUCOSE, CAPILLARY
Glucose-Capillary: 132 mg/dL — ABNORMAL HIGH (ref 70–99)
Glucose-Capillary: 143 mg/dL — ABNORMAL HIGH (ref 70–99)
Glucose-Capillary: 152 mg/dL — ABNORMAL HIGH (ref 70–99)
Glucose-Capillary: 187 mg/dL — ABNORMAL HIGH (ref 70–99)

## 2021-03-11 LAB — MAGNESIUM: Magnesium: 2.5 mg/dL — ABNORMAL HIGH (ref 1.7–2.4)

## 2021-03-11 LAB — MRSA NEXT GEN BY PCR, NASAL: MRSA by PCR Next Gen: NOT DETECTED

## 2021-03-11 LAB — HEPARIN LEVEL (UNFRACTIONATED): Heparin Unfractionated: >1.1 IU/mL — ABNORMAL HIGH (ref 0.30–0.70)

## 2021-03-11 LAB — APTT
aPTT: 30 seconds (ref 24–36)
aPTT: 57 seconds — ABNORMAL HIGH (ref 24–36)

## 2021-03-11 MED ORDER — SODIUM CHLORIDE 0.9% FLUSH: 10.0000 mL | INTRAVENOUS | Status: DC | PRN

## 2021-03-11 MED ORDER — DEXMEDETOMIDINE HCL IN NACL 400 MCG/100ML IV SOLN
0.4000 ug/kg/h | INTRAVENOUS | Status: DC
  Administered 2021-03-11 (×3): 0.4 ug/kg/h via INTRAVENOUS
  Administered 2021-03-11: 0.6 ug/kg/h via INTRAVENOUS
  Administered 2021-03-12 (×2): 0.4 ug/kg/h via INTRAVENOUS
  Filled 2021-03-11 (×6): qty 100

## 2021-03-11 MED ORDER — SODIUM CHLORIDE 0.9 % IV SOLN
2.0000 g | Freq: Three times a day (TID) | INTRAVENOUS | Status: DC
  Administered 2021-03-11 – 2021-03-12 (×3): 2 g via INTRAVENOUS
  Filled 2021-03-11 (×4): qty 2

## 2021-03-11 MED ORDER — PROPOFOL 1000 MG/100ML IV EMUL
0.0000 ug/kg/min | INTRAVENOUS | Status: DC
  Administered 2021-03-11: 10 ug/kg/min via INTRAVENOUS
  Administered 2021-03-11: 35 ug/kg/min via INTRAVENOUS
  Administered 2021-03-11: 25 ug/kg/min via INTRAVENOUS
  Administered 2021-03-11: 5 ug/kg/min via INTRAVENOUS
  Administered 2021-03-11: 25 ug/kg/min via INTRAVENOUS
  Administered 2021-03-12: 15 ug/kg/min via INTRAVENOUS
  Filled 2021-03-11 (×6): qty 100

## 2021-03-11 MED ORDER — SODIUM CHLORIDE 0.9 % IV SOLN
500.0000 mg | INTRAVENOUS | Status: AC
  Administered 2021-03-11 – 2021-03-15 (×5): 500 mg via INTRAVENOUS
  Filled 2021-03-11 (×5): qty 500

## 2021-03-11 MED ORDER — MAGNESIUM SULFATE 2 GM/50ML IV SOLN
2.0000 g | Freq: Once | INTRAVENOUS | Status: AC
  Administered 2021-03-11: 2 g via INTRAVENOUS
  Filled 2021-03-11: qty 50

## 2021-03-11 MED ORDER — PHENYLEPHRINE 40 MCG/ML (10ML) SYRINGE FOR IV PUSH (FOR BLOOD PRESSURE SUPPORT)
PREFILLED_SYRINGE | INTRAVENOUS | Status: AC
  Filled 2021-03-11: qty 10

## 2021-03-11 MED ORDER — ORAL CARE MOUTH RINSE
15.0000 mL | OROMUCOSAL | Status: DC
  Administered 2021-03-11 – 2021-03-12 (×15): 15 mL via OROMUCOSAL

## 2021-03-11 MED ORDER — ROCURONIUM BROMIDE 10 MG/ML (PF) SYRINGE
PREFILLED_SYRINGE | INTRAVENOUS | Status: AC
  Filled 2021-03-11: qty 10

## 2021-03-11 MED ORDER — FUROSEMIDE 10 MG/ML IJ SOLN
80.0000 mg | Freq: Once | INTRAMUSCULAR | Status: AC
Start: 2021-03-11 — End: 2021-03-11
  Administered 2021-03-11: 80 mg via INTRAVENOUS
  Filled 2021-03-11: qty 8

## 2021-03-11 MED ORDER — SODIUM CHLORIDE 0.9% FLUSH
10.0000 mL | Freq: Two times a day (BID) | INTRAVENOUS | Status: DC
  Administered 2021-03-11 – 2021-03-13 (×3): 10 mL

## 2021-03-11 MED ORDER — PROPOFOL 10 MG/ML IV BOLUS: 0.5000 mg/kg | Freq: Once | INTRAVENOUS | Status: DC

## 2021-03-11 MED ORDER — POLYETHYLENE GLYCOL 3350 17 G PO PACK
17.0000 g | PACK | Freq: Every day | ORAL | Status: DC
  Filled 2021-03-11: qty 1

## 2021-03-11 MED ORDER — FENTANYL CITRATE PF 50 MCG/ML IJ SOSY
50.0000 ug | PREFILLED_SYRINGE | INTRAMUSCULAR | Status: DC | PRN
  Administered 2021-03-12: 100 ug via INTRAVENOUS
  Filled 2021-03-11 (×2): qty 2

## 2021-03-11 MED ORDER — ARTIFICIAL TEARS OPHTHALMIC OINT
TOPICAL_OINTMENT | OPHTHALMIC | Status: DC | PRN
  Administered 2021-03-11 – 2021-03-12 (×2): 1 via OPHTHALMIC
  Filled 2021-03-11: qty 3.5

## 2021-03-11 MED ORDER — BISACODYL 10 MG RE SUPP: 10.0000 mg | Freq: Every day | RECTAL | Status: DC | PRN

## 2021-03-11 MED ORDER — NOREPINEPHRINE 4 MG/250ML-% IV SOLN
INTRAVENOUS | Status: AC
  Administered 2021-03-11: 5 ug/min via INTRAVENOUS
  Filled 2021-03-11: qty 250

## 2021-03-11 MED ORDER — ETOMIDATE 2 MG/ML IV SOLN: 20.0000 mg | Freq: Once | INTRAVENOUS | Status: DC

## 2021-03-11 MED ORDER — HEPARIN (PORCINE) 25000 UT/250ML-% IV SOLN
1750.0000 [IU]/h | INTRAVENOUS | Status: AC
  Administered 2021-03-11: 1650 [IU]/h via INTRAVENOUS
  Administered 2021-03-12: 1850 [IU]/h via INTRAVENOUS
  Administered 2021-03-12: 1750 [IU]/h via INTRAVENOUS
  Filled 2021-03-11 (×3): qty 250

## 2021-03-11 MED ORDER — SODIUM CHLORIDE 0.9 % IV SOLN
250.0000 mL | INTRAVENOUS | Status: DC
  Administered 2021-03-11: 250 mL via INTRAVENOUS

## 2021-03-11 MED ORDER — NOREPINEPHRINE 4 MG/250ML-% IV SOLN
2.0000 ug/min | INTRAVENOUS | Status: DC
Start: 2021-03-11 — End: 2021-03-12

## 2021-03-11 MED ORDER — FAMOTIDINE IN NACL 20-0.9 MG/50ML-% IV SOLN
20.0000 mg | INTRAVENOUS | Status: DC
  Administered 2021-03-11 – 2021-03-12 (×2): 20 mg via INTRAVENOUS
  Filled 2021-03-11 (×2): qty 50

## 2021-03-11 MED ORDER — DOCUSATE SODIUM 50 MG/5ML PO LIQD: 100.0000 mg | Freq: Two times a day (BID) | ORAL | Status: DC

## 2021-03-11 MED ORDER — METHYLPREDNISOLONE SODIUM SUCC 40 MG IJ SOLR
40.0000 mg | Freq: Two times a day (BID) | INTRAMUSCULAR | Status: DC
  Administered 2021-03-11 (×2): 40 mg via INTRAVENOUS
  Filled 2021-03-11 (×2): qty 1

## 2021-03-11 MED ORDER — ETOMIDATE 2 MG/ML IV SOLN
INTRAVENOUS | Status: AC
  Administered 2021-03-11: 20 mg
  Filled 2021-03-11: qty 20

## 2021-03-11 MED ORDER — FENTANYL CITRATE PF 50 MCG/ML IJ SOSY
50.0000 ug | PREFILLED_SYRINGE | INTRAMUSCULAR | Status: DC | PRN
  Administered 2021-03-12: 50 ug via INTRAVENOUS

## 2021-03-11 MED ORDER — ROCURONIUM BROMIDE 50 MG/5ML IV SOLN
100.0000 mg | Freq: Once | INTRAVENOUS | Status: AC
  Administered 2021-03-11: 100 mg via INTRAVENOUS
  Filled 2021-03-11: qty 10

## 2021-03-11 MED ORDER — CHLORHEXIDINE GLUCONATE 0.12% ORAL RINSE (MEDLINE KIT)
15.0000 mL | Freq: Two times a day (BID) | OROMUCOSAL | Status: DC
  Administered 2021-03-11 – 2021-03-16 (×9): 15 mL via OROMUCOSAL

## 2021-03-11 NOTE — Progress Notes (Signed)
Pt arrived on 2H on Bi-PAP 20/8 60%. Pt was talking moving around. About 2 hours later Bi-PAP seems to not be productive so Pt was taken off V60 and placed on Servo-I NIV on 80% 22/8 and a rate of 15.

## 2021-03-11 NOTE — Progress Notes (Signed)
ANTICOAGULATION CONSULT NOTE - Initial Consult  Pharmacy Consult for Heparin Indication: atrial fibrillation  Allergies  Allergen Reactions   Ace Inhibitors Anaphylaxis   Black Cherry Fruit Extract Marcelline Mates Extract] Anaphylaxis    Tongue   Grenadine Flavor [Flavoring Agent] Anaphylaxis   Chlorhexidine     Patient Measurements: Height: _0  (185.4 cm) Weight: (!) 160 kg (352 lb 11.8 oz) IBW/kg (Calculated) : 79.9 Heparin Dosing Weight: 117.9 kg  Vital Signs: Temp: 99.5 F (37.5 C) (11/02 2100) Temp Source: Oral (11/02 2100) BP: 107/85 (11/02 2200) Pulse Rate: 67 (11/02 2200)  Labs: Recent Labs    03/10/21 2005 03/10/21 2116 03/10/21 2200 03/11/21 0206 03/11/21 0539 03/11/21 1102 03/11/21 2019  HGB 14.0 15.3  --  14.0 15.3  --   --   HCT 46.3 45.0  --  45.9 45.0  --   --   PLT 208  --   --  198  --   --   --   APTT  --   --   --   --   --  30 57*  HEPARINUNFRC  --   --   --   --   --   --  >1.10*  CREATININE 1.38*  --   --  1.31*  --   --  1.72*  TROPONINIHS 26*  --  26*  --   --   --   --      Estimated Creatinine Clearance: 69.6 mL/min (A) (by C-G formula based on SCr of 1.72 mg/dL (H)).   Medical History: Past Medical History:  Diagnosis Date   Cardiomyopathy ([place]) 06/2017   EF 40-45%   Chronic renal insufficiency, stage 3 (moderate) ([facility])    Diastolic dysfunction 58/0998   grade 2    Hypertension    Hypertensive urgency    Obesity     Medications:  Medications Prior to Admission  Medication Sig Dispense Refill Last Dose   ADVAIR HFA 115-21 MCG/ACT inhaler INHALE 2 PUFFS INTO THE LUNGS 2 (TWO) TIMES DAILY. 36 g 12 Past Week   albuterol (VENTOLIN HFA) 108 (90 Base) MCG/ACT inhaler Inhale 2 puffs into the lungs every 4 (four) hours as needed for wheezing or shortness of breath. 18 g 0 02/25/2021   amLODipine (NORVASC) 10 MG tablet Take 10 mg by mouth daily.   03/10/2021   apixaban (ELIQUIS) 5 MG TABS tablet Take 1 tablet (5 mg total) by mouth 2 (two)  times daily. 60 tablet 2 03/10/2021 at 5 pm   atorvastatin (LIPITOR) 10 MG tablet TAKE 1 TABLET (10 MG TOTAL) BY MOUTH DAILY. (Patient taking differently: Take 10 mg by mouth daily.) 90 tablet 3 03/10/2021   carvedilol (COREG) 3.125 MG tablet Take 3.125 mg by mouth 2 (two) times daily.   03/10/2021 at 5 pm   famotidine (PEPCID) 20 MG tablet Take 1 tablet (20 mg total) by mouth 2 (two) times daily. 28 tablet 0 03/10/2021   isosorbide dinitrate (ISORDIL) 20 MG tablet TAKE 1 TABLET (20 MG TOTAL) BY MOUTH 3 (THREE) TIMES DAILY. SCHEDULE OFFICE VISIT (Patient taking differently: Take 20 mg by mouth 3 (three) times daily.) 270 tablet 1 03/10/2021   loratadine (CLARITIN) 10 MG tablet Take 1 tablet (10 mg total) by mouth daily. 30 tablet 0 03/10/2021   bisoprolol (ZEBETA) 5 MG tablet Take 1 tablet (5 mg total) by mouth daily. (Patient not taking: No sig reported) 90 tablet 3 Not Taking   EPINEPHrine 0.3 mg/0.3 mL IJ SOAJ injection  Inject 0.3 mg into the muscle as needed for anaphylaxis. (Patient not taking: No sig reported) 1 each 1 Not Taking   fluticasone (FLONASE) 50 MCG/ACT nasal spray PLACE 2 SPRAYS INTO BOTH NOSTRILS DAILY. (Patient not taking: No sig reported) 16 g 6 Not Taking   furosemide (LASIX) 80 MG tablet Take 1 tablet (80 mg total) by mouth daily. (Patient not taking: No sig reported) 90 tablet 3 Not Taking   hydrALAZINE (APRESOLINE) 50 MG tablet Take 0.5 tablets (25 mg total) by mouth 3 (three) times daily. (Patient taking differently: Take 50 mg by mouth 3 (three) times daily.) 90 tablet 3    Multiple Vitamin (MULTIVITAMIN WITH MINERALS) TABS tablet Take 1 tablet by mouth daily. (Patient not taking: No sig reported)   Not Taking   polyethylene glycol (MIRALAX / GLYCOLAX) 17 g packet Take 17 g by mouth daily as needed for moderate constipation. (Patient not taking: No sig reported) 14 each 0 Not Taking   predniSONE (DELTASONE) 20 MG tablet Take 2 tablets (40 mg total) by mouth daily with breakfast.  Take $Rem'40mg'rpAw$  ( 2 tabs) daily for 5 days, then $RemoveBe'20mg'ROIpLkMfW$  (1 tab) daily for 5 days, then $RemoveBe'10mg'oCQvgYEbo$  (half tab) daily for 5 days. (Patient not taking: No sig reported) 20 tablet 0 Not Taking   sodium chloride (OCEAN) 0.65 % SOLN nasal spray Place 2 sprays into both nostrils 3 (three) times daily. (Patient not taking: No sig reported) 100 mL 0 Not Taking   spironolactone (ALDACTONE) 25 MG tablet Take 1 tablet (25 mg total) by mouth daily. (Patient not taking: No sig reported) 30 tablet 3 Not Taking   tamsulosin (FLOMAX) 0.4 MG CAPS capsule Take 1 capsule (0.4 mg total) by mouth daily after supper. (Patient not taking: No sig reported) 30 capsule 0 Not Taking   Scheduled:   arformoterol  15 mcg Nebulization BID   budesonide (PULMICORT) nebulizer solution  0.25 mg Nebulization BID   chlorhexidine gluconate (MEDLINE KIT)  15 mL Mouth Rinse BID   etomidate  20 mg Intravenous Once   ipratropium  0.5 mg Nebulization Q6H   mouth rinse  15 mL Mouth Rinse 10 times per day   methylPREDNISolone (SOLU-MEDROL) injection  40 mg Intravenous BID   polyethylene glycol  17 g Per Tube Daily   sodium chloride flush  10-40 mL Intracatheter Q12H   Infusions:   sodium chloride 10 mL/hr at 03/11/21 2200   azithromycin Stopped (03/11/21 1512)   ceFEPime (MAXIPIME) IV Stopped (03/11/21 1552)   dexmedetomidine (PRECEDEX) IV infusion 0.4 mcg/kg/hr (03/11/21 2200)   famotidine (PEPCID) IV Stopped (03/11/21 1317)   heparin 1,650 Units/hr (03/11/21 2200)   norepinephrine (LEVOPHED) Adult infusion Stopped (03/11/21 1652)   propofol (DIPRIVAN) infusion 15 mcg/kg/min (03/11/21 2200)    Assessment: 65 yo M presenting with hypercarbic respiratory failure, with history of atrial fibrillation (on Eliquis PTA). Last dose of Eliquis given on 11/1 @ 2309. Pharmacy consulted for heparin dosing.   Heparin level >1.1 (on Eliquis PTA) Aptt 57 seconds (on heparin 1650 units/hr)  Goal of Therapy:  Heparin level 0.3-0.7 units/ml Monitor platelets  by anticoagulation protocol: Yes aPTT goal 66-102   Plan:  Increase heparin infusion to 1850 units/hr. Check heparin level and aPTT with am labs  Daily CBC, heparin level.  Monitor for signs/symptoms of bleeding.    Thank you for allowing pharmacy to be a part of this patient's care.  Donnald Garre, PharmD Clinical Pharmacist  Please check AMION for all [place] numbers After  10:00 PM, call [place] [phone]

## 2021-03-11 NOTE — Progress Notes (Addendum)
eLink Physician-Brief Progress Note Patient Name: Zachary Hawkins DOB: [birth year] MRN: [id number]   Date of Service  03/11/2021  HPI/Events of Note  Received request for lacrilube.   eICU Interventions  Lacrilube ordered prn.      Intervention Category Minor Interventions: Other:  Larinda Buttery 03/11/2021, 10:10 PM  5:30 AM Pt is becoming hypertensive with BP 153/117, MAP 129.  HR in the 50s. Levophed has been off since 03/11/21.  Plan> Hydralazine IV ordered prn.   6:47 AM SBP 140s. Venous blood gas reviewed with pH 7.591/pCO2 31.4 on TV 630, rate 24, PEEP 10, 40% FiO2.   Plan> Decrease minute ventilation - decrease TV to 580, rate 20.

## 2021-03-11 NOTE — Progress Notes (Signed)
PHARMACY - PHYSICIAN COMMUNICATION CRITICAL VALUE ALERT - BLOOD CULTURE IDENTIFICATION (BCID)  Zachary Hawkins is an 65 y.o. male who presented to [facility] on 03/10/2021 with a chief complaint of possible PNA  Assessment:  1 out of 4 bottles positive (anaerobic) staph epi Seems likely to be a contaminant.  Patient is currently on broad spectrum antibiotics  Current antibiotics: Cefepime 2gm q8hr/azith 500mg  qd  Changes to prescribed antibiotics recommended:  Patient is on recommended antibiotics - No changes needed  Results for orders placed or performed during the hospital encounter of 03/10/21  Blood Culture ID Panel (Reflexed) (Collected: 03/10/2021  8:25 PM)  Result Value Ref Range   Enterococcus faecalis NOT DETECTED NOT DETECTED   Enterococcus Faecium NOT DETECTED NOT DETECTED   Listeria monocytogenes NOT DETECTED NOT DETECTED   Staphylococcus species DETECTED (A) NOT DETECTED   Staphylococcus aureus (BCID) NOT DETECTED NOT DETECTED   Staphylococcus epidermidis DETECTED (A) NOT DETECTED   Staphylococcus lugdunensis NOT DETECTED NOT DETECTED   Streptococcus species NOT DETECTED NOT DETECTED   Streptococcus agalactiae NOT DETECTED NOT DETECTED   Streptococcus pneumoniae NOT DETECTED NOT DETECTED   Streptococcus pyogenes NOT DETECTED NOT DETECTED   A.calcoaceticus-baumannii NOT DETECTED NOT DETECTED   Bacteroides fragilis NOT DETECTED NOT DETECTED   Enterobacterales NOT DETECTED NOT DETECTED   Enterobacter cloacae complex NOT DETECTED NOT DETECTED   Escherichia coli NOT DETECTED NOT DETECTED   Klebsiella aerogenes NOT DETECTED NOT DETECTED   Klebsiella oxytoca NOT DETECTED NOT DETECTED   Klebsiella pneumoniae NOT DETECTED NOT DETECTED   Proteus species NOT DETECTED NOT DETECTED   Salmonella species NOT DETECTED NOT DETECTED   Serratia marcescens NOT DETECTED NOT DETECTED   Haemophilus influenzae NOT DETECTED NOT DETECTED   Neisseria meningitidis NOT DETECTED NOT DETECTED    Pseudomonas aeruginosa NOT DETECTED NOT DETECTED   Stenotrophomonas maltophilia NOT DETECTED NOT DETECTED   Candida albicans NOT DETECTED NOT DETECTED   Candida auris NOT DETECTED NOT DETECTED   Candida glabrata NOT DETECTED NOT DETECTED   Candida krusei NOT DETECTED NOT DETECTED   Candida parapsilosis NOT DETECTED NOT DETECTED   Candida tropicalis NOT DETECTED NOT DETECTED   Cryptococcus neoformans/gattii NOT DETECTED NOT DETECTED   Methicillin resistance mecA/C NOT DETECTED NOT DETECTED    Thank you for allowing pharmacy to be a part of this patient's care.  13/05/2020, PharmD Clinical Pharmacist  Please check AMION for all [facility] Pharm[facility] numbers After 10:00 PM, call Main Pharmacy [phone]

## 2021-03-11 NOTE — Progress Notes (Signed)
ANTICOAGULATION CONSULT NOTE - Initial Consult  Pharmacy Consult for Heparin Indication: atrial fibrillation  Allergies  Allergen Reactions   Ace Inhibitors Anaphylaxis   Black Cherry Fruit Extract Marcelline Mates Extract] Anaphylaxis    Tongue   Grenadine Flavor [Flavoring Agent] Anaphylaxis   Chlorhexidine     Patient Measurements: Height: 6' 1"  (185.4 cm) Weight: (!) 160 kg (352 lb 11.8 oz) IBW/kg (Calculated) : 79.9 Heparin Dosing Weight: 117.9 kg  Vital Signs: Temp: 99.8 F (37.7 C) (11/02 0812) Temp Source: Oral (11/02 0812) BP: 94/46 (11/02 0700) Pulse Rate: 64 (11/02 0700)  Labs: Recent Labs    03/10/21 2005 03/10/21 2116 03/10/21 2200 03/11/21 0206 03/11/21 0539  HGB 14.0 15.3  --  14.0 15.3  HCT 46.3 45.0  --  45.9 45.0  PLT 208  --   --  198  --   CREATININE 1.38*  --   --  1.31*  --   TROPONINIHS 26*  --  26*  --   --     Estimated Creatinine Clearance: 91.4 mL/min (A) (by C-G formula based on SCr of 1.31 mg/dL (H)).   Medical History: Past Medical History:  Diagnosis Date   Cardiomyopathy ([place]) 06/2017   EF 40-45%   Chronic renal insufficiency, stage 3 (moderate) (HCC)    Diastolic dysfunction 42/3953   grade 2    Hypertension    Hypertensive urgency    Obesity     Medications:  Medications Prior to Admission  Medication Sig Dispense Refill Last Dose   ADVAIR HFA 115-21 MCG/ACT inhaler INHALE 2 PUFFS INTO THE LUNGS 2 (TWO) TIMES DAILY. 36 g 12    albuterol (VENTOLIN HFA) 108 (90 Base) MCG/ACT inhaler Inhale 2 puffs into the lungs every 4 (four) hours as needed for wheezing or shortness of breath. 18 g 0    apixaban (ELIQUIS) 5 MG TABS tablet Take 1 tablet (5 mg total) by mouth 2 (two) times daily. 60 tablet 2    atorvastatin (LIPITOR) 10 MG tablet TAKE 1 TABLET (10 MG TOTAL) BY MOUTH DAILY. (Patient taking differently: Take 10 mg by mouth daily.) 90 tablet 3    bisoprolol (ZEBETA) 5 MG tablet Take 1 tablet (5 mg total) by mouth daily. 90 tablet 3     EPINEPHrine 0.3 mg/0.3 mL IJ SOAJ injection Inject 0.3 mg into the muscle as needed for anaphylaxis. 1 each 1    famotidine (PEPCID) 20 MG tablet Take 1 tablet (20 mg total) by mouth 2 (two) times daily. 28 tablet 0    fluticasone (FLONASE) 50 MCG/ACT nasal spray PLACE 2 SPRAYS INTO BOTH NOSTRILS DAILY. (Patient taking differently: Place 2 sprays into both nostrils daily as needed for allergies.) 16 g 6    furosemide (LASIX) 80 MG tablet Take 1 tablet (80 mg total) by mouth daily. 90 tablet 3    GENTLE LAXATIVE 10 MG suppository Place 10 mg rectally daily as needed.      hydrALAZINE (APRESOLINE) 50 MG tablet Take 0.5 tablets (25 mg total) by mouth 3 (three) times daily. 90 tablet 3    isosorbide dinitrate (ISORDIL) 20 MG tablet TAKE 1 TABLET (20 MG TOTAL) BY MOUTH 3 (THREE) TIMES DAILY. SCHEDULE OFFICE VISIT (Patient taking differently: Take 20 mg by mouth 3 (three) times daily.) 270 tablet 1    loratadine (CLARITIN) 10 MG tablet Take 1 tablet (10 mg total) by mouth daily. 30 tablet 0    Multiple Vitamin (MULTIVITAMIN WITH MINERALS) TABS tablet Take 1 tablet by mouth daily.  polyethylene glycol (MIRALAX / GLYCOLAX) 17 g packet Take 17 g by mouth daily as needed for moderate constipation. 14 each 0    predniSONE (DELTASONE) 20 MG tablet Take 2 tablets (40 mg total) by mouth daily with breakfast. Take 52m ( 2 tabs) daily for 5 days, then 28m(1 tab) daily for 5 days, then 1012mhalf tab) daily for 5 days. 20 tablet 0    sodium chloride (OCEAN) 0.65 % SOLN nasal spray Place 2 sprays into both nostrils 3 (three) times daily. (Patient taking differently: Place 2 sprays into both nostrils as needed for congestion.) 100 mL 0    spironolactone (ALDACTONE) 25 MG tablet Take 1 tablet (25 mg total) by mouth daily. 30 tablet 3    tamsulosin (FLOMAX) 0.4 MG CAPS capsule Take 1 capsule (0.4 mg total) by mouth daily after supper. 30 capsule 0    Scheduled:   arformoterol  15 mcg Nebulization BID    budesonide (PULMICORT) nebulizer solution  0.25 mg Nebulization BID   chlorhexidine gluconate (MEDLINE KIT)  15 mL Mouth Rinse BID   docusate  100 mg Per Tube BID   etomidate  20 mg Intravenous Once   famotidine  20 mg Oral BID   ipratropium  0.5 mg Nebulization Q6H   mouth rinse  15 mL Mouth Rinse 10 times per day   methylPREDNISolone (SOLU-MEDROL) injection  40 mg Intravenous BID   polyethylene glycol  17 g Per Tube Daily   rocuronium bromide       sodium chloride flush  10-40 mL Intracatheter Q12H   Infusions:   sodium chloride 250 mL (03/11/21 0734)   dexmedetomidine (PRECEDEX) IV infusion 0.4 mcg/kg/hr (03/11/21 0814)   heparin     magnesium sulfate bolus IVPB 2 g (03/11/21 1055)   norepinephrine (LEVOPHED) Adult infusion 6 mcg/min (03/11/21 1056)   propofol (DIPRIVAN) infusion 25 mcg/kg/min (03/11/21 1041)    Assessment: 65 34 M presenting with hypercarbic respiratory failure, with history of atrial fibrillation (on Eliquis PTA). Last dose of Eliquis given on 11/1 @ 2309. Pharmacy consulted for heparin dosing.   CBC stable. No signs/symptoms of bleeding noted.   Goal of Therapy:  Heparin level 0.3-0.7 units/ml Monitor platelets by anticoagulation protocol: Yes   Plan:  Start heparin infusion at 1650 units/hr. Check ~6hr heparin level and aPTT.  Daily CBC, heparin level.  Monitor for signs/symptoms of bleeding.    AmaVance PeperharmD PGY1 Pharmacy Resident 03/11/2021 11:03 AM   Please check AMION for all [facility] numbers After 10:00 PM, call [phone]

## 2021-03-11 NOTE — Progress Notes (Addendum)
NAME:  Zachary Hawkins, MRN:  [id number], DOB:  06-25-1955, LOS: 1 ADMISSION DATE:  03/10/2021, CONSULTATION DATE:  03/10/2021 REFERRING MD:  Dr. , CHIEF COMPLAINT:  SOB  History of Present Illness:  65 yo M w/ a hx of Afib on Eliquis, TUD in remission, OSA not currently on PAP therapy due to cost, HFrEF recovered (60-65% Sept 2022), ACE-I angioedema 10/2017, recent admission in Aug for possible anaphylaxis who is admitted for hypercarbic respiratory failure. Pt was seen at his cardiogists office yesterday and was recommended to come to the hospital due to somnolence, he refused. However, today pt was at home and thinks that his o2 was malfunctioning so he called his community Conservator, museum/gallery who assessed the pt and called EMS. Pt was reportedly saturating into the 70s when EMS arrived and pt was placed on CPAP, transitioned to BIPAP in the ED. Pt was initially somnolent in the ED but able to be awoken to voice. ABG in the ED was 7.29/91.1/109.   Pertinent  Medical History  Afib on Eliquis TUD in remission OSA not currently on PAP therapy due to cost HFrEF recovered (now 60-65% Sept 2022) ACE-I angioedema 10/2017  Significant Hospital Events: Including procedures, antibiotic start and stop dates in addition to other pertinent events   Admitted 11/1 11/2 intubated after failing BiPAP   Interim History / Subjective:  Intubated 11/2 AM  Objective   Blood pressure 109/74, pulse 88, temperature 98.6 F (37 C), temperature source Oral, resp. rate 20, height 6\' 1"  (1.854 m), weight (!) 160 kg, SpO2 96 %.    Vent Mode: PRVC FiO2 (%):  [30 %-100 %] 100 % Set Rate:  [8 bmp-26 bmp] 26 bmp Vt Set:  [630 mL] 630 mL PEEP:  [8 cmH20-10 cmH20] 10 cmH20   Intake/Output Summary (Last 24 hours) at 03/11/2021 0726 Last data filed at 03/11/2021 0147 Gross per 24 hour  Intake --  Output 750 ml  Net -750 ml   Filed Weights   03/10/21 2006  Weight: (!) 160 kg   Examination:  General: Critically and  chronically ill appearing adult M, intubated sedated NAD  Neuro: Sedated PERRL does not follow commands HEENT: NCAT pink mm ETT secure  Pulm: Symmetrical chest expansion, wheeze, crackles. Mechanically ventilated. High pressures  CV: rrr s1s2 no rgm cap refill brisk  GI: Protuberant, firm.  + bowel sounds but distant.  GU: wnl MSK: no acute joint deformity no cyanosis no edema  Integ: c/d/w no rash    Resolved Hospital Problem list   N/a  Assessment & Plan:   Acute on chronic hypoxic and hypercarbic respiratory failure Pulm edema  Hx OSA Suspected COPD with acute exacerbation  -on advair outpt, no PFTs  -AHI 63.5, not on CPAP due to cost  P -Pulmicort, brovana, ipratropium  -changing pred to solumedrol  -cefepime, azithro -PRN lasix  -Cont MV. Rate incr and I:E adjusted based on last gas which also improved some of pts autopeep, pressures.   -Repeat VBG later today and adjust as needed   HTN -Hydral, isosorbide lasix outpt  P  - Cont Hydralazine for now, cont to hold other meds  Afib on Eliquis P  - changing to heparin per pharmacy   AKI on CKD III P -trend renal indices, UOP   GERD  P -pepcid   Inadequate PO intake -unable to place OGT 2/2 edema related to traumatic intubation P -will defer NGT / cortrak attempts 11/2. Is on prop so not completely devoid  of kcal at present. If needed, cortrak by which time hopefully some of the swelling will have improved  -if needed can start dextrose containing fluids, is ok for now   Best Practice (right click and "Reselect all SmartList Selections" daily)   Diet/type: NPO DVT prophylaxis: systemic heparin GI prophylaxis: H2B Lines: PIVs Foley:  yes Code Status:  full code Last date of multidisciplinary goals of care discussion: 11/1  Labs   CBC: Recent Labs  Lab 03/10/21 2005 03/10/21 2116 03/11/21 0206  WBC 8.5  --  7.1  NEUTROABS 4.3  --   --   HGB 14.0 15.3 14.0  HCT 46.3 45.0 45.9  MCV 96.1  --  95.4   PLT 208  --  198     Basic Metabolic Panel: Recent Labs  Lab 03/10/21 2005 03/10/21 2116 03/11/21 0206  NA 140 138 136  K 3.9 4.0 3.9  CL 94*  --  91*  CO2 39*  --  36*  GLUCOSE 112*  --  123*  BUN 18  --  22  CREATININE 1.38*  --  1.31*  CALCIUM 8.8*  --  8.6*    GFR: Estimated Creatinine Clearance: 91.4 mL/min (A) (by C-G formula based on SCr of 1.31 mg/dL (H)). Recent Labs  Lab 03/10/21 2005 03/10/21 2205 03/11/21 0206  WBC 8.5  --  7.1  LATICACIDVEN 0.9 1.2  --      Liver Function Tests: No results for input(s): AST, ALT, ALKPHOS, BILITOT, PROT, ALBUMIN in the last 168 hours. No results for input(s): LIPASE, AMYLASE in the last 168 hours. No results for input(s): AMMONIA in the last 168 hours.  ABG    Component Value Date/Time   PHART 7.293 (L) 03/10/2021 2116   PCO2ART 91.1 (HH) 03/10/2021 2116   PO2ART 109 (H) 03/10/2021 2116   HCO3 40.1 (H) 03/11/2021 0658   TCO2 47 (H) 03/10/2021 2116   O2SAT 65.8 03/11/2021 0658     Coagulation Profile: No results for input(s): INR, PROTIME in the last 168 hours.  Cardiac Enzymes: No results for input(s): CKTOTAL, CKMB, CKMBINDEX, TROPONINI in the last 168 hours.  HbA1C: No results found for: HGBA1C  CBG: No results for input(s): GLUCAP in the last 168 hours.   CRITICAL CARE Performed by: Cristal Generous   Total critical care time: 50 minutes  Critical care time was exclusive of separately billable procedures and treating other patients. Critical care was necessary to treat or prevent imminent or life-threatening deterioration.  Critical care was time spent personally by me on the following activities: development of treatment plan with patient and/or surrogate as well as nursing, discussions with consultants, evaluation of patient's response to treatment, examination of patient, obtaining history from patient or surrogate, ordering and performing treatments and interventions, ordering and review of  laboratory studies, ordering and review of radiographic studies, pulse oximetry and re-evaluation of patient's condition.  Eliseo Gum MSN, AGACNP-BC [place] for pager  03/11/2021, 11:52 AM

## 2021-03-11 NOTE — Progress Notes (Signed)
eLink Physician-Brief Progress Note Patient Name: Zachary Hawkins DOB: [birth year] MRN: [id number]   Date of Service  03/11/2021  HPI/Events of Note  Pt's gas exchange has not changed; he no longer arousals; his chest rise is non-existent on an iPAP of 22, ePAP 8.    eICU Interventions  Will ask in house team to eval     Intervention Category Major Interventions: Respiratory failure - evaluation and management  Jacinta Shoe 03/11/2021, 3:13 AM

## 2021-03-11 NOTE — Progress Notes (Addendum)
Pharmacy Antibiotic Note  Zachary Hawkins is a 65 y.o. male with possible pneumonia.  Pharmacy has been consulted for cefepime dosing (also on azithromycin).  Plan: -Cefepime 2gm IV q8h -Will follow renal function, cultures and clinical progress   Height: 6\' 1"  (185.4 cm) Weight: (!) 160 kg (352 lb 11.8 oz) IBW/kg (Calculated) : 79.9  Temp (24hrs), Avg:99.1 F (37.3 C), Min:98.6 F (37 C), Max:99.8 F (37.7 C)  Recent Labs  Lab 03/10/21 2005 03/10/21 2205 03/11/21 0206  WBC 8.5  --  7.1  CREATININE 1.38*  --  1.31*  LATICACIDVEN 0.9 1.2  --     Estimated Creatinine Clearance: 91.4 mL/min (A) (by C-G formula based on SCr of 1.31 mg/dL (H)).    Allergies  Allergen Reactions   Ace Inhibitors Anaphylaxis   Black Cherry Fruit Extract 13/02/22 Extract] Anaphylaxis    Tongue   Grenadine Flavor [Flavoring Agent] Anaphylaxis   Chlorhexidine     Antimicrobials this admission: 11/2 cefepime>> 11/2 azithromycin>>  Dose adjustments this admission: 11/1 blood x2- ngtd  Microbiology results:  Thank you for allowing pharmacy to be a part of this patient's care.  13/1, PharmD Clinical Pharmacist **Pharmacist phone directory can now be found on [email or web address] ([email or web address]).  Listed under [facility].

## 2021-03-11 NOTE — Progress Notes (Signed)
20mg  of etomidate and 100mg  of rocuronium given for rapid sequence intubation. Time out called by Md.  of neosynephrine given for post intubation hypotension

## 2021-03-11 NOTE — Procedures (Signed)
Intubation Procedure Note  Zachary Hawkins  [id number]  01-14-1956  Date:03/11/21  Time:6:17 AM   Provider Performing: Vear Clock   Procedure: Intubation (31500)  Indication(s) Respiratory Failure  Consent Unable to obtain consent due to emergent nature of procedure.  Anesthesia Etomidate and Rocuronium  Time Out Verified patient identification, verified procedure, site/side was marked, verified correct patient position, special equipment/implants available, medications/allergies/relevant history reviewed, required imaging and test results available.  Sterile Technique Usual hand hygeine, masks, and gloves were used  Procedure Description Patient positioned in bed supine.  Sedation given as noted above.  Patient was intubated with endotracheal tube using Glidescope.  View was Grade 2 only posterior commissure .  Number of attempts was  2 .  Colorimetric CO2 detector was consistent with tracheal placement. Somewhat of a difficult intubation due to anatomy. Pt was reventilated w/ BVM between attempts, and the lowest SPO2 saturation was 92%.   Complications/Tolerance Difficulty obtaining view due to large tongue that obscured view. A size 3 blade was used that was not sufficient to fully elevate the tongue. Additionally, redundant posterior glottic tissue obscured some of the view, and cricoid pressure was required. 2nd attempt required a rigid stylet and a small amount of bleeding was noted near the epiglottis. 5 minutes after the procedure the pt also required Phenylephrine for hypotension.  Chest X-ray is ordered to verify placement.   Specimen(s) None

## 2021-03-12 ENCOUNTER — Inpatient Hospital Stay (HOSPITAL_COMMUNITY): Payer: Medicaid Other

## 2021-03-12 DIAGNOSIS — J9622 Acute and chronic respiratory failure with hypercapnia: Secondary | ICD-10-CM | POA: Diagnosis not present

## 2021-03-12 DIAGNOSIS — J9621 Acute and chronic respiratory failure with hypoxia: Secondary | ICD-10-CM | POA: Diagnosis not present

## 2021-03-12 LAB — GLUCOSE, CAPILLARY
Glucose-Capillary: 155 mg/dL — ABNORMAL HIGH (ref 70–99)
Glucose-Capillary: 149 mg/dL — ABNORMAL HIGH (ref 70–99)
Glucose-Capillary: 163 mg/dL — ABNORMAL HIGH (ref 70–99)
Glucose-Capillary: 154 mg/dL — ABNORMAL HIGH (ref 70–99)

## 2021-03-12 LAB — POCT I-STAT EG7
Acid-Base Excess: 9 mmol/L — ABNORMAL HIGH (ref 0.0–2.0)
Bicarbonate: 30.2 mmol/L — ABNORMAL HIGH (ref 20.0–28.0)
Calcium, Ion: 1.13 mmol/L — ABNORMAL LOW (ref 1.15–1.40)
HCT: 48 % (ref 39.0–52.0)
Hemoglobin: 16.3 g/dL (ref 13.0–17.0)
O2 Saturation: 93 %
Patient temperature: 98.9
Potassium: 4.6 mmol/L (ref 3.5–5.1)
Sodium: 137 mmol/L (ref 135–145)
TCO2: 31 mmol/L (ref 22–32)
pCO2, Ven: 31.4 mmHg — ABNORMAL LOW (ref 44.0–60.0)
pH, Ven: 7.591 — ABNORMAL HIGH (ref 7.250–7.430)
pO2, Ven: 57 mmHg — ABNORMAL HIGH (ref 32.0–45.0)

## 2021-03-12 LAB — CBC
HCT: 48.9 % (ref 39.0–52.0)
Hemoglobin: 15.6 g/dL (ref 13.0–17.0)
MCH: 28.8 pg (ref 26.0–34.0)
MCHC: 31.9 g/dL (ref 30.0–36.0)
MCV: 90.2 fL (ref 80.0–100.0)
Platelets: 243 10*3/uL (ref 150–400)
RBC: 5.42 MIL/uL (ref 4.22–5.81)
RDW: 16.4 % — ABNORMAL HIGH (ref 11.5–15.5)
WBC: 9.1 10*3/uL (ref 4.0–10.5)
nRBC: 0 % (ref 0.0–0.2)

## 2021-03-12 LAB — BASIC METABOLIC PANEL
Anion gap: 17 — ABNORMAL HIGH (ref 5–15)
BUN: 28 mg/dL — ABNORMAL HIGH (ref 8–23)
CO2: 20 mmol/L — ABNORMAL LOW (ref 22–32)
Calcium: 9.2 mg/dL (ref 8.9–10.3)
Chloride: 100 mmol/L (ref 98–111)
Creatinine, Ser: 1.59 mg/dL — ABNORMAL HIGH (ref 0.61–1.24)
GFR, Estimated: 48 mL/min — ABNORMAL LOW (ref >60)
Glucose, Bld: 169 mg/dL — ABNORMAL HIGH (ref 70–99)
Potassium: 4.1 mmol/L (ref 3.5–5.1)
Sodium: 137 mmol/L (ref 135–145)

## 2021-03-12 LAB — BLOOD GAS, VENOUS
Acid-Base Excess: 3.8 mmol/L — ABNORMAL HIGH (ref 0.0–2.0)
Bicarbonate: 26.5 mmol/L (ref 20.0–28.0)
Drawn by: 5896
FIO2: 40
O2 Saturation: 92.9 %
Patient temperature: 37.2
pCO2, Ven: 31.4 mmHg — ABNORMAL LOW (ref 44.0–60.0)
pH, Ven: 7.537 — ABNORMAL HIGH (ref 7.250–7.430)
pO2, Ven: 61.6 mmHg — ABNORMAL HIGH (ref 32.0–45.0)

## 2021-03-12 LAB — APTT
aPTT: 89 seconds — ABNORMAL HIGH (ref 24–36)
aPTT: 117 seconds — ABNORMAL HIGH (ref 24–36)

## 2021-03-12 LAB — HEPARIN LEVEL (UNFRACTIONATED): Heparin Unfractionated: >1.1 IU/mL — ABNORMAL HIGH (ref 0.30–0.70)

## 2021-03-12 LAB — TRIGLYCERIDES: Triglycerides: 83 mg/dL (ref <150)

## 2021-03-12 MED ORDER — METHYLPREDNISOLONE SODIUM SUCC 40 MG IJ SOLR
40.0000 mg | Freq: Every day | INTRAMUSCULAR | Status: DC
Start: 2021-03-12 — End: 2021-03-13
  Administered 2021-03-12 – 2021-03-13 (×2): 40 mg via INTRAVENOUS
  Filled 2021-03-12 (×2): qty 1

## 2021-03-12 MED ORDER — REVEFENACIN 175 MCG/3ML IN SOLN
175.0000 ug | Freq: Every day | RESPIRATORY_TRACT | Status: DC
  Administered 2021-03-12 – 2021-03-13 (×2): 175 ug via RESPIRATORY_TRACT
  Filled 2021-03-12 (×2): qty 3

## 2021-03-12 MED ORDER — ISOSORBIDE DINITRATE 10 MG PO TABS
20.0000 mg | ORAL_TABLET | Freq: Three times a day (TID) | ORAL | Status: DC
  Administered 2021-03-12 – 2021-03-16 (×12): 20 mg via ORAL
  Filled 2021-03-12 (×12): qty 2

## 2021-03-12 MED ORDER — FENTANYL CITRATE PF 50 MCG/ML IJ SOSY
50.0000 ug | PREFILLED_SYRINGE | INTRAMUSCULAR | Status: DC | PRN
  Administered 2021-03-12: 100 ug via INTRAVENOUS
  Filled 2021-03-12: qty 2

## 2021-03-12 MED ORDER — POLYETHYLENE GLYCOL 3350 17 G PO PACK
17.0000 g | PACK | Freq: Every day | ORAL | Status: DC
  Administered 2021-03-13 – 2021-03-14 (×2): 17 g via ORAL
  Filled 2021-03-12 (×4): qty 1

## 2021-03-12 MED ORDER — BUDESONIDE 0.5 MG/2ML IN SUSP
0.5000 mg | Freq: Two times a day (BID) | RESPIRATORY_TRACT | Status: DC
  Administered 2021-03-12 – 2021-03-13 (×2): 0.5 mg via RESPIRATORY_TRACT
  Filled 2021-03-12 (×2): qty 2

## 2021-03-12 MED ORDER — SODIUM CHLORIDE 0.9 % IV SOLN
1.0000 g | INTRAVENOUS | Status: AC
  Administered 2021-03-12 – 2021-03-15 (×4): 1 g via INTRAVENOUS
  Filled 2021-03-12 (×4): qty 10

## 2021-03-12 MED ORDER — APIXABAN 5 MG PO TABS
5.0000 mg | ORAL_TABLET | Freq: Two times a day (BID) | ORAL | Status: DC
  Administered 2021-03-12 – 2021-03-16 (×8): 5 mg via ORAL
  Filled 2021-03-12 (×8): qty 1

## 2021-03-12 MED ORDER — BISOPROLOL FUMARATE 5 MG PO TABS
5.0000 mg | ORAL_TABLET | Freq: Every day | ORAL | Status: DC
  Administered 2021-03-12 – 2021-03-16 (×5): 5 mg via ORAL
  Filled 2021-03-12 (×5): qty 1

## 2021-03-12 MED ORDER — HYDRALAZINE HCL 20 MG/ML IJ SOLN
20.0000 mg | INTRAMUSCULAR | Status: DC | PRN
  Administered 2021-03-12: 20 mg via INTRAVENOUS
  Filled 2021-03-12: qty 1

## 2021-03-12 NOTE — Progress Notes (Addendum)
NAME:  Zachary Hawkins, MRN:  [id number], DOB:  [birth year], LOS: 2 ADMISSION DATE:  03/10/2021, CONSULTATION DATE:  03/10/2021 REFERRING MD:  Dr. , CHIEF COMPLAINT:  SOB  History of Present Illness:  65 yo M w/ a hx of Afib on Eliquis, TUD in remission, OSA not currently on PAP therapy due to cost, HFrEF recovered (60-65% Sept 2022), ACE-I angioedema 10/2017, recent admission in Aug for possible anaphylaxis who is admitted for hypercarbic respiratory failure. Pt was seen at his cardiogists office yesterday and was recommended to come to the hospital due to somnolence, he refused. However, today pt was at home and thinks that his o2 was malfunctioning so he called his community Engineer, maintenance (IT) who assessed the pt and called EMS. Pt was reportedly saturating into the 70s when EMS arrived and pt was placed on CPAP, transitioned to BIPAP in the ED. Pt was initially somnolent in the ED but able to be awoken to voice. ABG in the ED was 7.29/91.1/109.   Pertinent  Medical History  Afib on Eliquis TUD in remission OSA not currently on PAP therapy due to cost HFrEF recovered (now 60-65% Sept 2022) ACE-I angioedema 10/2017  Significant Hospital Events: Including procedures, antibiotic start and stop dates in addition to other pertinent events   Admitted 11/1 11/2 intubated after failing BiPAP   Interim History / Subjective:   No events overnight BC 1/4 staph epi- felt to be contaminant  Afebrile Woke up/ f/c on WUA  Remains sedated on precedex, propofol, and prn fentanyl  Midline removed overnight, not working  Objective   Blood pressure (!) 122/93, pulse 72, temperature 98.1 F (36.7 C), temperature source Oral, resp. rate 17, height 6' 1"  (1.854 m), weight (!) 160 kg, SpO2 95 %.    Vent Mode: PRVC FiO2 (%):  [40 %-50 %] 40 % Set Rate:  [20 bmp-30 bmp] 20 bmp Vt Set:  [580 mL-630 mL] 580 mL PEEP:  [8 cmH20-10 cmH20] 8 cmH20 Plateau Pressure:  [25 cmH20-28 cmH20] 27 cmH20   Intake/Output  Summary (Last 24 hours) at 03/12/2021 0858 Last data filed at 03/12/2021 0700 Gross per 24 hour  Intake 2175.01 ml  Output 3295 ml  Net -1119.99 ml   Filed Weights   03/10/21 2006  Weight: (!) 160 kg   Examination: General:  Critically ill adult male sedated on MV in NAD HEENT: MM pink/moist, ETT, no OGT, pupils pinpoint, anicteric  Neuro:  sedated, does not f/c, responds to noxious stimuli CV: rr, NSR PULM:  non labored on MV, small cuff leak, clear anteriorly, diminished right base, no wheeze, down to 8 peep/ 40%, no autopeeping, plat 27 GI:  firm/ distended, +bs, foley Extremities: warm/dry, trace LE edema  Skin: no rashes  UOP 3.2L/ 24hrs -1.1L/ net -2.1  Labs and micro reviewed > 1/4 BC stap epi (anaerobic) likely contaminant   Resolved Hospital Problem list   N/a  Assessment & Plan:   Acute on chronic hypoxic and hypercarbic respiratory failure Pulm edema  Hx OSA- untreated Suspected COPD with acute exacerbation  Difficult intubation -on advair outpt, no PFTs  -AHI 63.5, not on CPAP due to cost  P - continue full MV support- resp alk on VBG this am, TV/ rate reduced - hopeful to start weaning trials today, extubated as able - PAD protocol w/ precedex, wean off propofol, prn fentanyl for RASS goal 0/-1 - continue duonebs prn, pulmicort, brovanna, switching to yupelri - decrease solumedrol to 36m daily - change cefepime >  ceftriaxone, azithro for 5 days of abx therapy - follow cultures - holding lasix today pending BMET - OGT inserted to decompress abd, pending KUB   HTN -Hydral, isosorbide lasix outpt  P  - Cont prn Hydralazine for now, cont to hold other home meds (coreg,, isordil, spironolactone) given softer BP this morning  Afib on Eliquis P - continue heparin per pharmacy for now - remains in NSR  AKI on CKD III P - good UOP with diuresis 11/1 and 11/2 - BMET ordered  - Trend BMP / urinary output - Replace electrolytes as indicated - Avoid  nephrotoxic agents, ensure adequate renal perfusion  GERD  P -pepcid   Inadequate PO intake -unable to place OGT 2/2 edema related to traumatic intubation P - OGT placed, KUB pending.  Start TF if not extubated by afternoon   Best Practice (right click and "Reselect all SmartList Selections" daily)   Diet/type: NPO DVT prophylaxis: systemic heparin GI prophylaxis: H2B Lines: PIVs Foley:  yes Code Status:  full code Last date of multidisciplinary goals of care discussion: 11/1  No family at bedside.  Labs   CBC: Recent Labs  Lab 03/10/21 2005 03/10/21 2116 03/11/21 0206 03/11/21 0539 03/12/21 0510 03/12/21 0557 03/12/21 0621  WBC 8.5  --  7.1  --  9.1  --   --   NEUTROABS 4.3  --   --   --   --   --   --   HGB 14.0   < > 14.0 15.3 15.6 11.6* 16.3  HCT 46.3   < > 45.9 45.0 48.9 34.0* 48.0  MCV 96.1  --  95.4  --  90.2  --   --   PLT 208  --  198  --  243  --   --    < > = values in this interval not displayed.    Basic Metabolic Panel: Recent Labs  Lab 03/10/21 2005 03/10/21 2116 03/11/21 0206 03/11/21 0539 03/11/21 2019 03/12/21 0557 03/12/21 0621  NA 140   < > 136 140 137 127* 137  K 3.9   < > 3.9 4.3 4.6 >8.5* 4.6  CL 94*  --  91*  --  98  --   --   CO2 39*  --  36*  --  28  --   --   GLUCOSE 112*  --  123*  --  141*  --   --   BUN 18  --  22  --  23  --   --   CREATININE 1.38*  --  1.31*  --  1.72*  --   --   CALCIUM 8.8*  --  8.6*  --  9.2  --   --   MG  --   --   --   --  2.5*  --   --    < > = values in this interval not displayed.   GFR: Estimated Creatinine Clearance: 69.6 mL/min (A) (by C-G formula based on SCr of 1.72 mg/dL (H)). Recent Labs  Lab 03/10/21 2005 03/10/21 2205 03/11/21 0206 03/12/21 0510  WBC 8.5  --  7.1 9.1  LATICACIDVEN 0.9 1.2  --   --     Liver Function Tests: No results for input(s): AST, ALT, ALKPHOS, BILITOT, PROT, ALBUMIN in the last 168 hours. No results for input(s): LIPASE, AMYLASE in the last 168  hours. No results for input(s): AMMONIA in the last 168 hours.  ABG    Component  Value Date/Time   PHART 7.293 (L) 03/10/2021 2116   PCO2ART 91.1 (HH) 03/10/2021 2116   PO2ART 109 (H) 03/10/2021 2116   HCO3 26.5 03/12/2021 0632   TCO2 31 03/12/2021 0621   ACIDBASEDEF 2.0 03/12/2021 0557   O2SAT 92.9 03/12/2021 0632    Coagulation Profile: No results for input(s): INR, PROTIME in the last 168 hours.  Cardiac Enzymes: No results for input(s): CKTOTAL, CKMB, CKMBINDEX, TROPONINI in the last 168 hours.  HbA1C: No results found for: HGBA1C  CBG: Recent Labs  Lab 03/11/21 1737 03/11/21 2005 03/11/21 2337 03/12/21 0333 03/12/21 0824  GLUCAP 152* 143* 132* 155* 149*     CRITICAL CARE Performed by: Kennieth Rad   Total critical care time:  40 minutes  Critical care time was exclusive of separately billable procedures and treating other patients. Critical care was necessary to treat or prevent imminent or life-threatening deterioration.  Critical care was time spent personally by me on the following activities: development of treatment plan with patient and/or surrogate as well as nursing, discussions with consultants, evaluation of patient's response to treatment, examination of patient, obtaining history from patient or surrogate, ordering and performing treatments and interventions, ordering and review of laboratory studies, ordering and review of radiographic studies, pulse oximetry and re-evaluation of patient's condition.   Kennieth Rad, ACNP [facility] 03/12/2021, 8:58 AM  See Amion for pager If no response to pager, please call PCCM consult pager After 7:00 pm call Elink

## 2021-03-12 NOTE — Progress Notes (Addendum)
ANTICOAGULATION CONSULT NOTE  Pharmacy Consult for Heparin Indication: atrial fibrillation  Allergies  Allergen Reactions   Ace Inhibitors Anaphylaxis   Black Cherry Fruit Extract Marcelline Mates Extract] Anaphylaxis    Tongue   Grenadine Flavor [Flavoring Agent] Anaphylaxis   Chlorhexidine     Patient Measurements: Height: 6' 1"  (185.4 cm) Weight: (!) 160 kg (352 lb 11.8 oz) IBW/kg (Calculated) : 79.9 Heparin Dosing Weight: 117.9 kg  Vital Signs: Temp: 97.8 F (36.6 C) (11/03 1110) Temp Source: Oral (11/03 1110) BP: 120/77 (11/03 1200) Pulse Rate: 73 (11/03 1200)  Labs: Recent Labs    03/10/21 2005 03/10/21 2116 03/10/21 2200 03/11/21 0206 03/11/21 0539 03/11/21 1102 03/11/21 2019 03/12/21 0510 03/12/21 0557 03/12/21 0621  HGB 14.0   < >  --  14.0   < >  --   --  15.6 11.6* 16.3  HCT 46.3   < >  --  45.9   < >  --   --  48.9 34.0* 48.0  PLT 208  --   --  198  --   --   --  243  --   --   APTT  --   --   --   --   --  30 57* 89*  --   --   HEPARINUNFRC  --   --   --   --   --   --  >1.10* >1.10*  --   --   CREATININE 1.38*  --   --  1.31*  --   --  1.72* 1.59*  --   --   TROPONINIHS 26*  --  26*  --   --   --   --   --   --   --    < > = values in this interval not displayed.     Estimated Creatinine Clearance: 75.3 mL/min (A) (by C-G formula based on SCr of 1.59 mg/dL (H)).   Medical History: Past Medical History:  Diagnosis Date   Cardiomyopathy ([place]) 06/2017   EF 40-45%   Chronic renal insufficiency, stage 3 (moderate) (HCC)    Diastolic dysfunction 57/3220   grade 2    Hypertension    Hypertensive urgency    Obesity     Medications:  Medications Prior to Admission  Medication Sig Dispense Refill Last Dose   ADVAIR HFA 115-21 MCG/ACT inhaler INHALE 2 PUFFS INTO THE LUNGS 2 (TWO) TIMES DAILY. 36 g 12 Past Week   albuterol (VENTOLIN HFA) 108 (90 Base) MCG/ACT inhaler Inhale 2 puffs into the lungs every 4 (four) hours as needed for wheezing or shortness  of breath. 18 g 0 02/25/2021   amLODipine (NORVASC) 10 MG tablet Take 10 mg by mouth daily.   03/10/2021   apixaban (ELIQUIS) 5 MG TABS tablet Take 1 tablet (5 mg total) by mouth 2 (two) times daily. 60 tablet 2 03/10/2021 at 5 pm   atorvastatin (LIPITOR) 10 MG tablet TAKE 1 TABLET (10 MG TOTAL) BY MOUTH DAILY. (Patient taking differently: Take 10 mg by mouth daily.) 90 tablet 3 03/10/2021   carvedilol (COREG) 3.125 MG tablet Take 3.125 mg by mouth 2 (two) times daily.   03/10/2021 at 5 pm   famotidine (PEPCID) 20 MG tablet Take 1 tablet (20 mg total) by mouth 2 (two) times daily. 28 tablet 0 03/10/2021   isosorbide dinitrate (ISORDIL) 20 MG tablet TAKE 1 TABLET (20 MG TOTAL) BY MOUTH 3 (THREE) TIMES DAILY. SCHEDULE OFFICE VISIT (Patient taking differently:  Take 20 mg by mouth 3 (three) times daily.) 270 tablet 1 03/10/2021   loratadine (CLARITIN) 10 MG tablet Take 1 tablet (10 mg total) by mouth daily. 30 tablet 0 03/10/2021   bisoprolol (ZEBETA) 5 MG tablet Take 1 tablet (5 mg total) by mouth daily. (Patient not taking: No sig reported) 90 tablet 3 Not Taking   EPINEPHrine 0.3 mg/0.3 mL IJ SOAJ injection Inject 0.3 mg into the muscle as needed for anaphylaxis. (Patient not taking: No sig reported) 1 each 1 Not Taking   fluticasone (FLONASE) 50 MCG/ACT nasal spray PLACE 2 SPRAYS INTO BOTH NOSTRILS DAILY. (Patient not taking: No sig reported) 16 g 6 Not Taking   furosemide (LASIX) 80 MG tablet Take 1 tablet (80 mg total) by mouth daily. (Patient not taking: No sig reported) 90 tablet 3 Not Taking   hydrALAZINE (APRESOLINE) 50 MG tablet Take 0.5 tablets (25 mg total) by mouth 3 (three) times daily. (Patient taking differently: Take 50 mg by mouth 3 (three) times daily.) 90 tablet 3    Multiple Vitamin (MULTIVITAMIN WITH MINERALS) TABS tablet Take 1 tablet by mouth daily. (Patient not taking: No sig reported)   Not Taking   polyethylene glycol (MIRALAX / GLYCOLAX) 17 g packet Take 17 g by mouth daily as needed  for moderate constipation. (Patient not taking: No sig reported) 14 each 0 Not Taking   predniSONE (DELTASONE) 20 MG tablet Take 2 tablets (40 mg total) by mouth daily with breakfast. Take 23m ( 2 tabs) daily for 5 days, then 296m(1 tab) daily for 5 days, then 10108mhalf tab) daily for 5 days. (Patient not taking: No sig reported) 20 tablet 0 Not Taking   sodium chloride (OCEAN) 0.65 % SOLN nasal spray Place 2 sprays into both nostrils 3 (three) times daily. (Patient not taking: No sig reported) 100 mL 0 Not Taking   spironolactone (ALDACTONE) 25 MG tablet Take 1 tablet (25 mg total) by mouth daily. (Patient not taking: No sig reported) 30 tablet 3 Not Taking   tamsulosin (FLOMAX) 0.4 MG CAPS capsule Take 1 capsule (0.4 mg total) by mouth daily after supper. (Patient not taking: No sig reported) 30 capsule 0 Not Taking   Scheduled:   arformoterol  15 mcg Nebulization BID   budesonide (PULMICORT) nebulizer solution  0.5 mg Nebulization BID   chlorhexidine gluconate (MEDLINE KIT)  15 mL Mouth Rinse BID   etomidate  20 mg Intravenous Once   mouth rinse  15 mL Mouth Rinse 10 times per day   methylPREDNISolone (SOLU-MEDROL) injection  40 mg Intravenous Daily   polyethylene glycol  17 g Per Tube Daily   revefenacin  175 mcg Nebulization Daily   sodium chloride flush  10-40 mL Intracatheter Q12H   Infusions:   sodium chloride Stopped (03/12/21 0507)   azithromycin Stopped (03/11/21 1512)   cefTRIAXone (ROCEPHIN)  IV 1 g (03/12/21 1214)   dexmedetomidine (PRECEDEX) IV infusion 1 mcg/kg/hr (03/12/21 1000)   famotidine (PEPCID) IV 20 mg (03/12/21 1258)   heparin 1,850 Units/hr (03/12/21 0700)    Assessment: [age] M presenting with hypercarbic respiratory failure, with history of atrial fibrillation (on Eliquis PTA). Last dose of Eliquis given on 11/1 @ 2309. Pharmacy consulted for heparin dosing.   Heparin level >1.1 (on recent DOAC), aPTT came back therapeutic at 89, on heparin infusion at 1850  units/hr. Hgb 16.3, plt 243. No s/sx of bleeding or infusion issues.   Goal of Therapy:  Heparin level 0.3-0.7 units/ml Monitor  platelets by anticoagulation protocol: Yes aPTT goal 66-102   Plan:  Continue heparin infusion to 1850 units/hr Order aPTT in 6 hours from last check to confirm rate Check heparin level and aPTT with am labs  Daily CBC, heparin level.  Monitor for signs/symptoms of bleeding.    Thank you for allowing pharmacy to be a part of this patient's care.  Antonietta Jewel, PharmD, [place] Clinical Pharmacist  Phone: [phone] 03/12/2021 1:27 PM  Please check AMION for all [place] phone numbers After 10:00 PM, call [place] [phone]   ADDENDUM aPTT came back elevated at 117, on 1750 units/hr. No s/sx of bleeding or infusion issues - some blood tinted urine but will monitor. Will get level in 6 hours after rate change.  Antonietta Jewel, PharmD, [place] Clinical Pharmacist

## 2021-03-12 NOTE — Plan of Care (Signed)

## 2021-03-12 NOTE — Progress Notes (Signed)
[place] for stop heparin/start apixaban Indication: atrial fibrillation  Allergies  Allergen Reactions   Ace Inhibitors Anaphylaxis   Black Cherry Fruit Extract Marcelline Mates Extract] Anaphylaxis    Tongue   Grenadine Flavor [Flavoring Agent] Anaphylaxis   Chlorhexidine     Patient Measurements: Height: 6' 1"  (185.4 cm) Weight: (!) 160 kg (352 lb 11.8 oz) IBW/kg (Calculated) : 79.9 Heparin Dosing Weight: 117.9 kg  Vital Signs: Temp: 98.2 F (36.8 C) (11/03 1645) Temp Source: Oral (11/03 1645) BP: 130/43 (11/03 1700) Pulse Rate: 117 (11/03 1800)  Labs: Recent Labs    03/10/21 2005 03/10/21 2116 03/10/21 2200 03/11/21 0206 03/11/21 0539 03/11/21 2019 03/12/21 0510 03/12/21 0557 03/12/21 0621 03/12/21 1342  HGB 14.0   < >  --  14.0   < >  --  15.6 11.6* 16.3  --   HCT 46.3   < >  --  45.9   < >  --  48.9 34.0* 48.0  --   PLT 208  --   --  198  --   --  243  --   --   --   APTT  --   --   --   --    < > 57* 89*  --   --  117*  HEPARINUNFRC  --   --   --   --   --  >1.10* >1.10*  --   --   --   CREATININE 1.38*  --   --  1.31*  --  1.72* 1.59*  --   --   --   TROPONINIHS 26*  --  26*  --   --   --   --   --   --   --    < > = values in this interval not displayed.     Estimated Creatinine Clearance: 75.3 mL/min (A) (by C-G formula based on SCr of 1.59 mg/dL (H)).   Medical History: Past Medical History:  Diagnosis Date   Cardiomyopathy ([place]) 06/2017   EF 40-45%   Chronic renal insufficiency, stage 3 (moderate) (HCC)    Diastolic dysfunction 98/9211   grade 2    Hypertension    Hypertensive urgency    Obesity     Medications:  Medications Prior to Admission  Medication Sig Dispense Refill Last Dose   ADVAIR HFA 115-21 MCG/ACT inhaler INHALE 2 PUFFS INTO THE LUNGS 2 (TWO) TIMES DAILY. 36 g 12 Past Week   albuterol (VENTOLIN HFA) 108 (90 Base) MCG/ACT inhaler Inhale 2 puffs into the lungs every 4 (four) hours as needed  for wheezing or shortness of breath. 18 g 0 02/25/2021   amLODipine (NORVASC) 10 MG tablet Take 10 mg by mouth daily.   03/10/2021   apixaban (ELIQUIS) 5 MG TABS tablet Take 1 tablet (5 mg total) by mouth 2 (two) times daily. 60 tablet 2 03/10/2021 at 5 pm   atorvastatin (LIPITOR) 10 MG tablet TAKE 1 TABLET (10 MG TOTAL) BY MOUTH DAILY. (Patient taking differently: Take 10 mg by mouth daily.) 90 tablet 3 03/10/2021   carvedilol (COREG) 3.125 MG tablet Take 3.125 mg by mouth 2 (two) times daily.   03/10/2021 at 5 pm   famotidine (PEPCID) 20 MG tablet Take 1 tablet (20 mg total) by mouth 2 (two) times daily. 28 tablet 0 03/10/2021   isosorbide dinitrate (ISORDIL) 20 MG tablet TAKE 1 TABLET (20 MG TOTAL) BY MOUTH 3 (THREE) TIMES DAILY. SCHEDULE OFFICE VISIT (  Patient taking differently: Take 20 mg by mouth 3 (three) times daily.) 270 tablet 1 03/10/2021   loratadine (CLARITIN) 10 MG tablet Take 1 tablet (10 mg total) by mouth daily. 30 tablet 0 03/10/2021   bisoprolol (ZEBETA) 5 MG tablet Take 1 tablet (5 mg total) by mouth daily. (Patient not taking: No sig reported) 90 tablet 3 Not Taking   EPINEPHrine 0.3 mg/0.3 mL IJ SOAJ injection Inject 0.3 mg into the muscle as needed for anaphylaxis. (Patient not taking: No sig reported) 1 each 1 Not Taking   fluticasone (FLONASE) 50 MCG/ACT nasal spray PLACE 2 SPRAYS INTO BOTH NOSTRILS DAILY. (Patient not taking: No sig reported) 16 g 6 Not Taking   furosemide (LASIX) 80 MG tablet Take 1 tablet (80 mg total) by mouth daily. (Patient not taking: No sig reported) 90 tablet 3 Not Taking   hydrALAZINE (APRESOLINE) 50 MG tablet Take 0.5 tablets (25 mg total) by mouth 3 (three) times daily. (Patient taking differently: Take 50 mg by mouth 3 (three) times daily.) 90 tablet 3    Multiple Vitamin (MULTIVITAMIN WITH MINERALS) TABS tablet Take 1 tablet by mouth daily. (Patient not taking: No sig reported)   Not Taking   polyethylene glycol (MIRALAX / GLYCOLAX) 17 g packet Take 17 g  by mouth daily as needed for moderate constipation. (Patient not taking: No sig reported) 14 each 0 Not Taking   predniSONE (DELTASONE) 20 MG tablet Take 2 tablets (40 mg total) by mouth daily with breakfast. Take 20m ( 2 tabs) daily for 5 days, then 228m(1 tab) daily for 5 days, then 1079mhalf tab) daily for 5 days. (Patient not taking: No sig reported) 20 tablet 0 Not Taking   sodium chloride (OCEAN) 0.65 % SOLN nasal spray Place 2 sprays into both nostrils 3 (three) times daily. (Patient not taking: No sig reported) 100 mL 0 Not Taking   spironolactone (ALDACTONE) 25 MG tablet Take 1 tablet (25 mg total) by mouth daily. (Patient not taking: No sig reported) 30 tablet 3 Not Taking   tamsulosin (FLOMAX) 0.4 MG CAPS capsule Take 1 capsule (0.4 mg total) by mouth daily after supper. (Patient not taking: No sig reported) 30 capsule 0 Not Taking   Scheduled:   apixaban  5 mg Oral BID   arformoterol  15 mcg Nebulization BID   bisoprolol  5 mg Oral Daily   budesonide (PULMICORT) nebulizer solution  0.5 mg Nebulization BID   chlorhexidine gluconate (MEDLINE KIT)  15 mL Mouth Rinse BID   isosorbide dinitrate  20 mg Oral TID   mouth rinse  15 mL Mouth Rinse 10 times per day   methylPREDNISolone (SOLU-MEDROL) injection  40 mg Intravenous Daily   [START ON 03/13/2021] polyethylene glycol  17 g Oral Daily   revefenacin  175 mcg Nebulization Daily   sodium chloride flush  10-40 mL Intracatheter Q12H   Infusions:   sodium chloride Stopped (03/12/21 1525)   azithromycin Stopped (03/12/21 1450)   cefTRIAXone (ROCEPHIN)  IV Stopped (03/12/21 1244)   dexmedetomidine (PRECEDEX) IV infusion Stopped (03/12/21 1120)   heparin 1,750 Units/hr (03/12/21 1800)    Assessment: [age] M presenting with hypercarbic respiratory failure, with history of atrial fibrillation (on Eliquis PTA). Discontinue Heparin drip and restart eliquis    Plan:  Discontinued heparin drip at 22:00 Start apixaban 5 mg po bid at  22:00 Monitor for signs/symptoms of bleeding.    Thank you for allowing pharmacy to be a part of this patient's care.  Vaughan Basta BS, PharmD, BCPS Clinical Pharmacist  03/12/2021 6:58 PM  Please check AMION for all [place] phone numbers After 10:00 PM, call [place] [phone]

## 2021-03-12 NOTE — Procedures (Signed)
Extubation Procedure Note  Patient Details:   Name: BRYCIN KILLE DOB: [birth year] MRN: [id number]   Airway Documentation:    Vent end date: 03/12/21 Vent end time: 1127   Evaluation  O2 sats: stable throughout Complications: No apparent complications Patient did tolerate procedure well. Bilateral Breath Sounds: Clear, Diminished   Pt extubated to 2L [place] per MD order. Pt is tolerating well at this time. Pt had positive cuff leak prior to extubation. No stridor noted. Pt able to voice his name.  Guss Bunde 03/12/2021, 11:27 AM

## 2021-03-12 NOTE — Progress Notes (Signed)
PT placed on cpap for the night. 

## 2021-03-12 NOTE — Progress Notes (Signed)
Midline assessment: LUE midline occluded, unable to flush despite position changes and dressing change/ pulling back line. Site otherwise unremarkable. Midline removed. RN reports pt may require vasoactive medications again. PIV placed in R forearm with US guidance. Please consider PICC for prolonged infusion/ vasoactive medications.

## 2021-03-13 ENCOUNTER — Encounter (HOSPITAL_COMMUNITY): Payer: Self-pay | Admitting: Pulmonary Disease

## 2021-03-13 DIAGNOSIS — J9601 Acute respiratory failure with hypoxia: Secondary | ICD-10-CM

## 2021-03-13 LAB — POCT I-STAT 7, (LYTES, BLD GAS, ICA,H+H)
Acid-Base Excess: 4 mmol/L — ABNORMAL HIGH (ref 0.0–2.0)
Bicarbonate: 31.7 mmol/L — ABNORMAL HIGH (ref 20.0–28.0)
Calcium, Ion: 1.19 mmol/L (ref 1.15–1.40)
HCT: 46 % (ref 39.0–52.0)
Hemoglobin: 15.6 g/dL (ref 13.0–17.0)
O2 Saturation: 94 %
Patient temperature: 99.1
Potassium: 4.8 mmol/L (ref 3.5–5.1)
Sodium: 137 mmol/L (ref 135–145)
TCO2: 33 mmol/L — ABNORMAL HIGH (ref 22–32)
pCO2 arterial: 59.2 mmHg — ABNORMAL HIGH (ref 32.0–48.0)
pH, Arterial: 7.339 — ABNORMAL LOW (ref 7.350–7.450)
pO2, Arterial: 80 mmHg — ABNORMAL LOW (ref 83.0–108.0)

## 2021-03-13 LAB — CBC
HCT: 43.9 % (ref 39.0–52.0)
Hemoglobin: 14.3 g/dL (ref 13.0–17.0)
MCH: 29.5 pg (ref 26.0–34.0)
MCHC: 32.6 g/dL (ref 30.0–36.0)
MCV: 90.5 fL (ref 80.0–100.0)
Platelets: 250 10*3/uL (ref 150–400)
RBC: 4.85 MIL/uL (ref 4.22–5.81)
RDW: 17 % — ABNORMAL HIGH (ref 11.5–15.5)
WBC: 14.4 10*3/uL — ABNORMAL HIGH (ref 4.0–10.5)
nRBC: 0 % (ref 0.0–0.2)

## 2021-03-13 LAB — BASIC METABOLIC PANEL
Anion gap: 10 (ref 5–15)
BUN: 38 mg/dL — ABNORMAL HIGH (ref 8–23)
CO2: 30 mmol/L (ref 22–32)
Calcium: 8.6 mg/dL — ABNORMAL LOW (ref 8.9–10.3)
Chloride: 96 mmol/L — ABNORMAL LOW (ref 98–111)
Creatinine, Ser: 1.59 mg/dL — ABNORMAL HIGH (ref 0.61–1.24)
GFR, Estimated: 48 mL/min — ABNORMAL LOW (ref >60)
Glucose, Bld: 105 mg/dL — ABNORMAL HIGH (ref 70–99)
Potassium: 4 mmol/L (ref 3.5–5.1)
Sodium: 136 mmol/L (ref 135–145)

## 2021-03-13 LAB — CULTURE, BLOOD (ROUTINE X 2): Special Requests: ADEQUATE

## 2021-03-13 LAB — MAGNESIUM: Magnesium: 2.5 mg/dL — ABNORMAL HIGH (ref 1.7–2.4)

## 2021-03-13 LAB — GLUCOSE, CAPILLARY: Glucose-Capillary: 149 mg/dL — ABNORMAL HIGH (ref 70–99)

## 2021-03-13 MED ORDER — TAMSULOSIN HCL 0.4 MG PO CAPS
0.4000 mg | ORAL_CAPSULE | Freq: Every day | ORAL | Status: DC
  Administered 2021-03-13 – 2021-03-15 (×3): 0.4 mg via ORAL
  Filled 2021-03-13 (×3): qty 1

## 2021-03-13 MED ORDER — PREDNISONE 20 MG PO TABS: 20.0000 mg | ORAL_TABLET | Freq: Every day | ORAL | Status: DC

## 2021-03-13 MED ORDER — FUROSEMIDE 20 MG PO TABS
60.0000 mg | ORAL_TABLET | Freq: Every day | ORAL | Status: DC
  Administered 2021-03-13 – 2021-03-16 (×4): 60 mg via ORAL
  Filled 2021-03-13 (×4): qty 3

## 2021-03-13 MED ORDER — FUROSEMIDE 40 MG PO TABS: 60.0000 mg | ORAL_TABLET | Freq: Every day | ORAL | Status: DC

## 2021-03-13 MED ORDER — FUROSEMIDE 40 MG PO TABS
60.0000 mg | ORAL_TABLET | Freq: Every day | ORAL | Status: DC
  Filled 2021-03-13: qty 1

## 2021-03-13 MED ORDER — PREDNISONE 20 MG PO TABS
30.0000 mg | ORAL_TABLET | Freq: Every day | ORAL | Status: AC
  Administered 2021-03-15 – 2021-03-16 (×2): 30 mg via ORAL
  Filled 2021-03-13 (×2): qty 1

## 2021-03-13 MED ORDER — MOMETASONE FURO-FORMOTEROL FUM 100-5 MCG/ACT IN AERO
2.0000 | INHALATION_SPRAY | Freq: Two times a day (BID) | RESPIRATORY_TRACT | Status: DC
  Administered 2021-03-13 – 2021-03-16 (×6): 2 via RESPIRATORY_TRACT
  Filled 2021-03-13: qty 8.8

## 2021-03-13 MED ORDER — PREDNISONE 20 MG PO TABS
40.0000 mg | ORAL_TABLET | Freq: Every day | ORAL | Status: AC
  Administered 2021-03-14: 40 mg via ORAL
  Filled 2021-03-13: qty 2

## 2021-03-13 MED ORDER — METHYLPREDNISOLONE SODIUM SUCC 40 MG IJ SOLR
20.0000 mg | Freq: Every day | INTRAMUSCULAR | Status: DC
Start: 2021-03-14 — End: 2021-03-13

## 2021-03-13 MED ORDER — ALBUTEROL SULFATE (2.5 MG/3ML) 0.083% IN NEBU: 2.5000 mg | INHALATION_SOLUTION | RESPIRATORY_TRACT | Status: DC | PRN

## 2021-03-13 MED ORDER — FUROSEMIDE 10 MG/ML IJ SOLN: 60.0000 mg | Freq: Four times a day (QID) | INTRAMUSCULAR | Status: DC

## 2021-03-13 MED ORDER — MOMETASONE FURO-FORMOTEROL FUM 100-5 MCG/ACT IN AERO
2.0000 | INHALATION_SPRAY | Freq: Two times a day (BID) | RESPIRATORY_TRACT | Status: DC
  Filled 2021-03-13: qty 8.8

## 2021-03-13 MED ORDER — SPIRONOLACTONE 25 MG PO TABS
25.0000 mg | ORAL_TABLET | Freq: Every day | ORAL | Status: DC
  Administered 2021-03-13 – 2021-03-16 (×4): 25 mg via ORAL
  Filled 2021-03-13 (×4): qty 1

## 2021-03-13 NOTE — Progress Notes (Addendum)
NAME:  Zachary Hawkins, MRN:  [id number], DOB:  1956/01/10, LOS: 3 ADMISSION DATE:  03/10/2021, CONSULTATION DATE:  03/10/2021 REFERRING MD:  Dr. , CHIEF COMPLAINT:  SOB  History of Present Illness:  65 yo M w/ a hx of Afib on Eliquis, TUD in remission, OSA not currently on PAP therapy due to cost, HFrEF recovered (60-65% Sept 2022), ACE-I angioedema 10/2017, recent admission in Aug for possible anaphylaxis who is admitted for hypercarbic respiratory failure. Pt was seen at his cardiogists office yesterday and was recommended to come to the hospital due to somnolence, he refused. However, today pt was at home and thinks that his o2 was malfunctioning so he called his community Engineer, maintenance (IT) who assessed the pt and called EMS. Pt was reportedly saturating into the 70s when EMS arrived and pt was placed on CPAP, transitioned to BIPAP in the ED. Pt was initially somnolent in the ED but able to be awoken to voice. ABG in the ED was 7.29/91.1/109.   Pertinent  Medical History  Afib on Eliquis TUD in remission OSA not currently on PAP therapy due to cost HFrEF recovered (now 60-65% Sept 2022) ACE-I angioedema 10/2017  Significant Hospital Events: Including procedures, antibiotic start and stop dates in addition to other pertinent events   Admitted 11/1 11/2 intubated after failing BiPAP  11/3 extubated, BC 1/4 staph epi- felt to be contaminant, transitioned back to Eliquis   Interim History / Subjective:   No complaints other than scratchy voice  Doing well on 3L [facility] in hall  Did not tolerate CPAP overnight due to mask  Objective   Blood pressure 138/90, pulse (!) 102, temperature 98 F (36.7 C), temperature source Axillary, resp. rate 20, height 6\' 1"  (1.854 m), weight (!) 141.6 kg, SpO2 96 %.    Vent Mode: PSV;CPAP FiO2 (%):  [40 %] 40 % PEEP:  [5 cmH20] 5 cmH20 Pressure Support:  [7 cmH20] 7 cmH20   Intake/Output Summary (Last 24 hours) at 03/13/2021 1035 Last data filed at  03/13/2021 0641 Gross per 24 hour  Intake 979.6 ml  Output 1400 ml  Net -420.4 ml   Filed Weights   03/10/21 2006 03/13/21 0647  Weight: (!) 160 kg (!) 141.6 kg   Examination: General:  Adult male sitting upright in bed in NAD HEENT: MM pink/moist Neuro: Alert, oriented x 4, MAE CV: rr, NSR  PULM:  non labored, CTA GI: obese, softer today, bs+, NT, voiding Extremities: warm/dry, trace LE edema  Skin: no rashes   UOP 1.4L/ 24hrs Net I/O balance  Labs and micro reviewed > sCr stable 1.59, bicarb 30, WBC 14.4,  one set of blood cultures w/ staph epi  Resolved Hospital Problem list   N/a  Assessment & Plan:   Acute on chronic hypoxic and hypercarbic respiratory failure Pulm edema  Hx OSA- untreated Suspected COPD with acute exacerbation  Difficult intubation -on advair outpt, no PFTs  -AHI 63.5 (2019), not on CPAP due to cost/ no insurance - last seen in our office 9/22 w/ Dr. Verlee Monte- at that time did not qualify for home O2, sleep study ordered P - ongoing aggressive pulmonary hygiene, progress mobility, IS - wean supplemental O2 as able.  Will need ambulatory walk test to determine if qualifies for home O2 - switching BD to dulera (in place of home advair) and prn albuterol  - change to oral pred taper - continue ceftriaxone/ azithro for 5 days of abx therapy, day 3x/5  - diuresis  as below - did not tolerate CPAP mask overnight.  Remains alert and oriented today.  He has been ordered home sleep study, as he now has insurance for CPAP, however after sleep study, would likely be at least 2 months to obtain CPAP machine.  Need to watch closely for signs of recurrent hypercarbia, given body habitus, possible OHS component.   If he becomes altered, will need ABG to see if patient qualifies for home NIV - will consult TOC to possibly determine requirements to obtain NIV w/ his insurance  - will need outpt PFTs and f/u with Dr. Glenetta Hew   HTN -Hydral, isosorbide lasix outpt   P - lasix 60 mg x 1  - continue imdur, bisoprolol, restarting home spironolactone, hold on restarting hydralazine.  Recently seen in outpt w/ cards-> stopped norvasc, ?if was taking coreg, decreased spironolactone  Afib on Eliquis P - remains in NSR, restarted Eliquis 11/3  AKI on CKD III P - lasix as above - sCr continues to improve - Trend BMP / urinary output - Replace electrolytes as indicated - Avoid nephrotoxic agents, ensure adequate renal perfusion  GERD  P -continue pepcid   Inadequate PO intake -unable to place OGT 2/2 edema related to traumatic intubation P Resolved, eating well    Best Practice (right click and "Reselect all SmartList Selections" daily)   Diet/type: Regular consistency (see orders) DVT prophylaxis: DOAC GI prophylaxis: H2B Lines: PIVs Foley:  d/c 11/3 Code Status:  full code Last date of multidisciplinary goals of care discussion: 11/1  No family at bedside.  Patient updated on plan of care.  To transfer to tele and to [facility] as of 11/5, PCCM sign off.   Labs   CBC: Recent Labs  Lab 03/10/21 2005 03/10/21 2116 03/11/21 0206 03/11/21 0539 03/12/21 0510 03/12/21 0557 03/12/21 0621 03/13/21 0347  WBC 8.5  --  7.1  --  9.1  --   --  14.4*  NEUTROABS 4.3  --   --   --   --   --   --   --   HGB 14.0   < > 14.0 15.3 15.6 11.6* 16.3 14.3  HCT 46.3   < > 45.9 45.0 48.9 34.0* 48.0 43.9  MCV 96.1  --  95.4  --  90.2  --   --  90.5  PLT 208  --  198  --  243  --   --  250   < > = values in this interval not displayed.    Basic Metabolic Panel: Recent Labs  Lab 03/10/21 2005 03/10/21 2116 03/11/21 0206 03/11/21 0539 03/11/21 2019 03/12/21 0510 03/12/21 0557 03/12/21 0621 03/13/21 0347  NA 140   < > 136   < > 137 137 127* 137 136  K 3.9   < > 3.9   < > 4.6 4.1 >8.5* 4.6 4.0  CL 94*  --  91*  --  98 100  --   --  96*  CO2 39*  --  36*  --  28 20*  --   --  30  GLUCOSE 112*  --  123*  --  141* 169*  --   --  105*  BUN 18  --  22   --  23 28*  --   --  38*  CREATININE 1.38*  --  1.31*  --  1.72* 1.59*  --   --  1.59*  CALCIUM 8.8*  --  8.6*  --  9.2 9.2  --   --  8.6*  MG  --   --   --   --  2.5*  --   --   --  2.5*   < > = values in this interval not displayed.   GFR: Estimated Creatinine Clearance: 70.4 mL/min (A) (by C-G formula based on SCr of 1.59 mg/dL (H)). Recent Labs  Lab 03/10/21 2005 03/10/21 2205 03/11/21 0206 03/12/21 0510 03/13/21 0347  WBC 8.5  --  7.1 9.1 14.4*  LATICACIDVEN 0.9 1.2  --   --   --     Liver Function Tests: No results for input(s): AST, ALT, ALKPHOS, BILITOT, PROT, ALBUMIN in the last 168 hours. No results for input(s): LIPASE, AMYLASE in the last 168 hours. No results for input(s): AMMONIA in the last 168 hours.  ABG    Component Value Date/Time   PHART 7.293 (L) 03/10/2021 2116   PCO2ART 91.1 (HH) 03/10/2021 2116   PO2ART 109 (H) 03/10/2021 2116   HCO3 26.5 03/12/2021 0632   TCO2 31 03/12/2021 0621   ACIDBASEDEF 2.0 03/12/2021 0557   O2SAT 92.9 03/12/2021 0632    Coagulation Profile: No results for input(s): INR, PROTIME in the last 168 hours.  Cardiac Enzymes: No results for input(s): CKTOTAL, CKMB, CKMBINDEX, TROPONINI in the last 168 hours.  HbA1C: No results found for: HGBA1C  CBG: Recent Labs  Lab 03/12/21 0333 03/12/21 0824 03/12/21 1106 03/12/21 1646 03/12/21 2117  GLUCAP 155* 149* 163* 149* 154*      Posey Boyer, ACNP [facility] 03/13/2021, 10:35 AM  See Amion for pager If no response to pager, please call PCCM consult pager After 7:00 pm call [place]

## 2021-03-13 NOTE — TOC Progression Note (Signed)
Transition of Care [facility]) - Progression Note    Patient Details  Name: Zachary Hawkins MRN: [id number] Date of Birth: [birth year]  [facility] of [facility]) CM/SW Contact  Okey Dupre Lazaro Arms, LCSW Phone Number: 03/13/2021, 11:57 AM  Clinical Narrative:  CSW was informed by nurse case manager Steward Drone that patient will be moving into his sister's house and the lights are being disconnected today. Patient is requesting that his case manager Bonita Quin at [facility] be contacted - [phone]. Call made to [facility] and advised Theodoro Grist, who answered the phone the reason for the call. After checking, Theodoro Grist informed CSW that case manager Bonita Quin will call patient.         Expected Discharge Plan and Services                                                 Social Determinants of Health (SDOH) Interventions  Patient needing assistance with keeping the lights on at his sister's house. He is moving into this home post-discharge.  Readmission Risk Interventions No flowsheet data found.

## 2021-03-13 NOTE — Plan of Care (Signed)

## 2021-03-13 NOTE — Progress Notes (Signed)
PCCM Update   Walk test earlier today:   Patient Saturations on Room Air at Rest =86% Patient Saturations on Room Air while Ambulating = 83% Patient Saturations on 3 Liters of oxygen while Ambulating = 94%   ABG on 3L [place] 7.339/ 59.2/ 80/ 31.7  Also, hx of OSA- last sleep study 2019, AHI 63.5- untreated  Current BMI 41.19 kg/m2  Patient qualifies for home O2 and NIV based on above findings.  Patient continues to exhibit signs of hypercarbia associated with chronic respiratory failure secondary to suspected COPD (no formal PFTs thus far), chronic hypoxic respiratory failure, OSA, and morbid obesity.  Patient requires the use of NIV both nightly and daytime to help with exacerbation periods.  The use of NIV will treat the patient's high PCO2 levels and can reduce risk of exacerbations and future hospitalizations when used at night and during the day.  Patient will need these advanced settings in conjunction with their current medication regimen.  BiPAP is not an option due to its functional limitations and the severity of the patient's condition.  Failure to have NIV available for use over 24-hour period could lead to death.  Patient's ABG exhibited on 3L nasal cannula with ABG values as confirming PCO2 of to greater than 52    P:  DME placed for home NIV and home O2      Posey Boyer, ACNP [facility] 03/13/2021, 4:28 PM  See Amion for pager If no response to pager, please call PCCM consult pager After 7:00 pm call [place]

## 2021-03-13 NOTE — Progress Notes (Signed)
SATURATION QUALIFICATIONS: (This note is used to comply with regulatory documentation for home oxygen)  Patient Saturations on Room Air at Rest =86%  Patient Saturations on Room Air while Ambulating = 83%  Patient Saturations on 3  Liters of oxygen while Ambulating = 94%  Please briefly explain why patient needs home oxygen:desaturates upon exertion

## 2021-03-13 NOTE — TOC Initial Note (Addendum)
Transition of Care [facility]) - Initial/Assessment Note    Patient Details  Name: Zachary Hawkins MRN: [id number] Date of Birth: 1956-01-29  [facility]) CM/SW Contact:    Bethena Roys, RN Phone Number: 03/13/2021, 1:00 PM  Clinical Narrative:   Risk for readmission assessment completed. Prior to admission patient was from home alone. Patient had previously moved into his sisters home [place] [address]. Patient has support of his son that checks in on him. Patient reported that he has oxygen home fills via [employer] and Case Manager received a consult for NIV- Information sent to [employer]. Awaiting to obtain the order for the MD to sign to see if insurance will authorize the NIV. Case Manager also spoke with [employer] that the patient will need additional oxygen tubing in the home. Patient states he has a Tourist information centre manager via [employer] at [phone]. Case Manager reached out to [place] to assist with calling Vaughan Basta to see if she can assist patient with his light bill. Patient reports that his lights are being turned off today. Patient will benefit from home health RN for disease management and PT services. Case Manager did call [facility] and they will not be able to service the patient for home health. As the patient progresses he may benefit from PT/OT consult.         1343 03-13-21 Reached out to NP to see if she can sign the NIV order form to submit to the insurance company.  Expected Discharge Plan: [place] Barriers to Discharge: Continued Medical Work up   Patient Goals and CMS Choice Patient states their goals for this hospitalization and ongoing recovery are:: to return home once stable.      Expected Discharge Plan and Services Expected Discharge Plan: [place] In-house Referral: Clinical Social Work Discharge Planning Services: CM Consult Post Acute Care Choice: Durable Medical Equipment Living  arrangements for the past 2 months: Single Family Home (staying at his sisters home-asked CSW to follow up with lights being cut out.)                 DME Arranged: NIV DME Agency: [employer] (trying to get information submitted to the insurance.) Date DME Agency Contacted: 03/13/21 Time DME Agency Contacted: 1255 Representative spoke with at DME Agency: Brenton Grills            Prior Living Arrangements/Services Living arrangements for the past 2 months: [place] (staying at his sisters home-asked CSW to follow up with lights being cut out.) Lives with:: Self (has support of his son.) Patient language and need for interpreter reviewed:: Yes Do you feel safe going back to the place where you live?: Yes      Need for Family Participation in Patient Care: Yes (Comment) Care giver support system in place?: Yes (comment) Current home services: DME (Has oxygen via [employer]- submitted information for NIV to [employer].) Criminal Activity/Legal Involvement Pertinent to Current Situation/Hospitalization: No - Comment as needed  Activities of Daily Living Home Assistive Devices/Equipment: Oxygen ADL Screening (condition at time of admission) Patient's cognitive ability adequate to safely complete daily activities?: Yes Is the patient deaf or have difficulty hearing?: No Does the patient have difficulty seeing, even when wearing glasses/contacts?: No Does the patient have difficulty concentrating, remembering, or making decisions?: No Patient able to express need for assistance with ADLs?: Yes Does the patient have difficulty dressing or bathing?: No Independently  performs ADLs?: Yes (appropriate for developmental age) Does the patient have difficulty walking or climbing stairs?: No Weakness of Legs: None Weakness of Arms/Hands: None  Permission Sought/Granted Permission sought to share information with : Family Supports, Magazine features editor, Case Estate manager/land agent  granted to share information with : Yes, Verbal Permission Granted     Permission granted to share info w AGENCY: [employer], [facility]        Emotional Assessment Appearance:: Appears stated age Attitude/Demeanor/Rapport: Engaged Affect (typically observed): Appropriate Orientation: : Oriented to Situation, Oriented to Place, Oriented to Self Alcohol / Substance Use: Not Applicable Psych Involvement: No (comment)  Admission diagnosis:  Acute respiratory failure with hypoxia (HCC) [J96.01] Acute respiratory failure with hypoxia and hypercapnia (HCC) [J96.01, J96.02] Acute on chronic congestive heart failure, unspecified heart failure type (HCC) [I50.9] Patient Active Problem List   Diagnosis Date Noted   Acute respiratory failure with hypoxia (HCC) 03/10/2021   Urticaria 01/15/2021   Chronic heart failure with preserved ejection fraction (HCC)    Angioedema 01/04/2021   Influenza vaccine refused 06/26/2020   COVID-19 vaccine series completed 06/26/2020   Paroxysmal atrial fibrillation (HCC) 07/02/2019   Secondary hypercoagulable state (HCC) 07/02/2019   OSA (obstructive sleep apnea) 04/03/2019   History of angioedema due to ACE INHIBITORS 10/13/2017   Non compliance w medication regimen 07/13/2017   Systolic and diastolic CHF, chronic (HCC) 06/23/2017   Essential hypertension 06/23/2017   Obesity with serious comorbidity    CKD (chronic kidney disease) stage 3, GFR 30-59 ml/min (HCC)    Cardiomyopathy (HCC) 06/06/2017   Dyspnea 06/05/2017   PCP:  System, Provider Not In Pharmacy:   [facility] - [place], [place] - [address] [place] [address] Phone: [phone] Fax: [phone]   Readmission Risk Interventions Readmission Risk Prevention Plan 03/13/2021  Transportation Screening Complete  Medication Review (RN Care Manager) Complete  PCP or Specialist appointment within 3-5 days of discharge Complete  HRI or Home Care Consult  Complete  SW Recovery Care/Counseling Consult Complete  Palliative Care Screening Not Applicable  Skilled Nursing Facility Complete  Some recent data might be hidden

## 2021-03-14 DIAGNOSIS — I4891 Unspecified atrial fibrillation: Secondary | ICD-10-CM

## 2021-03-14 DIAGNOSIS — Z01818 Encounter for other preprocedural examination: Secondary | ICD-10-CM

## 2021-03-14 DIAGNOSIS — I10 Essential (primary) hypertension: Secondary | ICD-10-CM

## 2021-03-14 LAB — BASIC METABOLIC PANEL
Anion gap: 6 (ref 5–15)
BUN: 34 mg/dL — ABNORMAL HIGH (ref 8–23)
CO2: 30 mmol/L (ref 22–32)
Calcium: 8.3 mg/dL — ABNORMAL LOW (ref 8.9–10.3)
Chloride: 98 mmol/L (ref 98–111)
Creatinine, Ser: 1.25 mg/dL — ABNORMAL HIGH (ref 0.61–1.24)
GFR, Estimated: >60 mL/min (ref >60)
Glucose, Bld: 92 mg/dL (ref 70–99)
Potassium: 4.6 mmol/L (ref 3.5–5.1)
Sodium: 134 mmol/L — ABNORMAL LOW (ref 135–145)

## 2021-03-14 MED ORDER — PANTOPRAZOLE SODIUM 40 MG PO TBEC
40.0000 mg | DELAYED_RELEASE_TABLET | Freq: Every day | ORAL | Status: DC
  Administered 2021-03-14 – 2021-03-16 (×3): 40 mg via ORAL
  Filled 2021-03-14 (×3): qty 1

## 2021-03-14 MED ORDER — AMLODIPINE BESYLATE 5 MG PO TABS
5.0000 mg | ORAL_TABLET | Freq: Every day | ORAL | Status: DC
  Administered 2021-03-14 – 2021-03-16 (×3): 5 mg via ORAL
  Filled 2021-03-14 (×3): qty 1

## 2021-03-14 NOTE — Progress Notes (Addendum)
PROGRESS NOTE  Zachary Hawkins [id number] DOB: 1955/05/18 DOA: 03/10/2021 PCP: System, Provider Not In   LOS: 4 days   Brief narrative:  Patient is a 65 years old male with past medical history of atrial fibrillation on Eliquis, sleep apnea not on CPAP due to cost factor, history of heart failure with reduced ejection, ACE inhibitor induced edema, presented hospital with  increased somnolence.  He stated that his oxygen was malfunctioning at home.  He was brought into the hospital with and was noted to have hypoxia with a pulse ox of 70% on room air and was placed on CPAP and transitioned to BiPAP in the ED.  Patient remained somnolent so was admitted to the ICU.  Initial ABG was pH of 7.2 with a PCO2 of 94.  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  Patient was supposed to be on CPAP at home but was not on it due to cost and lack of insurance.  Patient was initially intubated on 03/11/21 and mechanically ventilated but was subsequently extubated on 11/ 3/22.  At this time, patient is on supplemental oxygen.  Would highly benefit from NIV at home.  Transition of care on board.  Will need outpatient pulmonary follow-up with Dr. Doyle Askew, Pulmonary and PFT.  Continue albuterol nebulizer.  Continue prednisone taper, antibiotic with Rocephin and Zithromax..  Essential HTN Continue bisoprolol, amlodipine isosorbide and Lasix.  Spironolactone has been started as well.  Closely monitor blood pressure.   Atrial fibrillation on Eliquis On bisoprolol.  Continue Eliquis.  Has been started 03/12/2021.   AKI on CKD III Latest creatinine of 1.2.  Diuretics have been resumed.   GERD  Protonix for now especially on steroids.   Inadequate PO intake On oral diet at this time.  Morbid  obesity.   Patient would benefit from weight loss as outpatient.  DVT prophylaxis:  apixaban (ELIQUIS) tablet 5 mg   Code Status: Full code  Family Communication:  Spoke with the patient at bedside.  Tried to reach the patient's brother on the phone but was unable to reach him.  Status is: Inpatient  Remains inpatient appropriate because: Respiratory failure requiring noninvasive ventilation at home, and extubation.   Consultants: PCCM  Procedures: Intubation and mechanical ventilation  Anti-infectives:  IV Rocephin 11/ 3> IV Zithromax 11/3>  Anti-infectives (From admission, onward)    Start     Dose/Rate Route Frequency Ordered Stop   03/12/21 1200  cefTRIAXone (ROCEPHIN) 1 g in sodium chloride 0.9 % 100 mL IVPB        1 g 200 mL/hr over 30 Minutes Intravenous Every 24 hours 03/12/21 0944 03/16/21 1159   03/11/21 1530  ceFEPIme (MAXIPIME) 2 g in sodium chloride 0.9 % 100 mL IVPB  Status:  Discontinued        2 g 200 mL/hr over 30 Minutes Intravenous Every 8 hours 03/11/21 1436 03/12/21 0944   03/11/21 1400  azithromycin (ZITHROMAX) 500 mg in sodium chloride 0.9 % 250 mL IVPB        500 mg 250 mL/hr over 60 Minutes Intravenous Every 24 hours 03/11/21 1143 03/16/21 1359      Subjective:  Today, patient was seen and examined at bedside.  Patient denies any chest pain, shortness of breath, nausea vomiting.  Has mild cough with phlegm production.  He did  walk some.  Objective: Vitals:   03/14/21 0600 03/14/21 0741  BP: 136/85   Pulse: 81   Resp: (!) 22   Temp:  98.2 F (36.8 C)  SpO2: 94%     Intake/Output Summary (Last 24 hours) at 03/14/2021 0808 Last data filed at 03/14/2021 0600 Gross per 24 hour  Intake 350 ml  Output 3150 ml  Net -2800 ml   Filed Weights   03/10/21 2006 03/13/21 0647 03/14/21 0600  Weight: (!) 160 kg (!) 141.6 kg (!) 145.6 kg   Body mass index is 42.35 kg/m.   Physical Exam:  GENERAL: Patient is alert awake and oriented. Not in  obvious distress.  On 3 L of oxygen by nasal cannula, morbidly obese HENT: No scleral pallor or icterus. Pupils equally reactive to light. Oral mucosa is moist NECK: is supple, no gross swelling noted. CHEST:  Diminished breath sounds bilaterally. CVS: S1 and S2 heard, no murmur. Regular rate and rhythm.  ABDOMEN: Soft, non-tender, bowel sounds are present.  Bilateral hydrocele noted. EXTREMITIES: Bilateral lower extremity trace edema. CNS: Cranial nerves are intact. No focal motor deficits. SKIN: warm and dry without rashes.  Data Review: I have personally reviewed the following laboratory data and studies,  CBC: Recent Labs  Lab 03/10/21 2005 03/10/21 2116 03/11/21 0206 03/11/21 0539 03/12/21 0510 03/12/21 0557 03/12/21 0621 03/13/21 0347 03/13/21 1530  WBC 8.5  --  7.1  --  9.1  --   --  14.4*  --   NEUTROABS 4.3  --   --   --   --   --   --   --   --   HGB 14.0   < > 14.0   < > 15.6 11.6* 16.3 14.3 15.6  HCT 46.3   < > 45.9   < > 48.9 34.0* 48.0 43.9 46.0  MCV 96.1  --  95.4  --  90.2  --   --  90.5  --   PLT 208  --  198  --  243  --   --  250  --    < > = values in this interval not displayed.   Basic Metabolic Panel: Recent Labs  Lab 03/11/21 0206 03/11/21 0539 03/11/21 2019 03/12/21 0510 03/12/21 0557 03/12/21 0621 03/13/21 0347 03/13/21 1530 03/14/21 0231  NA 136   < > 137 137 127* 137 136 137 134*  K 3.9   < > 4.6 4.1 >8.5* 4.6 4.0 4.8 4.6  CL 91*  --  98 100  --   --  96*  --  98  CO2 36*  --  28 20*  --   --  30  --  30  GLUCOSE 123*  --  141* 169*  --   --  105*  --  92  BUN 22  --  23 28*  --   --  38*  --  34*  CREATININE 1.31*  --  1.72* 1.59*  --   --  1.59*  --  1.25*  CALCIUM 8.6*  --  9.2 9.2  --   --  8.6*  --  8.3*  MG  --   --  2.5*  --   --   --  2.5*  --   --    < > = values in this interval not displayed.   Liver Function Tests: No results for input(s): AST, ALT, ALKPHOS, BILITOT, PROT, ALBUMIN in the last 168 hours. No results for  input(s): LIPASE, AMYLASE in the last 168 hours. No results for input(s): AMMONIA in the last 168 hours. Cardiac Enzymes: No results for input(s): CKTOTAL, CKMB, CKMBINDEX, TROPONINI in the last 168 hours. BNP (last 3 results) Recent Labs    12/16/20 0022 02/12/21 0216 03/10/21 2005  BNP 29.6 242.1* 28.6    ProBNP (last 3 results) No results for input(s): PROBNP in the last 8760 hours.  CBG: Recent Labs  Lab 03/12/21 0333 03/12/21 0824 03/12/21 1106 03/12/21 1646 03/12/21 2117  GLUCAP 155* 149* 163* 149* 154*   Recent Results (from the past 240 hour(s))  Resp Panel by RT-PCR (Flu A&B, Covid) Nasopharyngeal Swab     Status: None   Collection Time: 03/10/21  8:06 PM   Specimen: Nasopharyngeal Swab; Nasopharyngeal(NP) swabs in vial transport medium  Result Value Ref Range Status   SARS Coronavirus 2 by RT PCR NEGATIVE NEGATIVE Final    Comment: (NOTE) SARS-CoV-2 target nucleic acids are NOT DETECTED.  The SARS-CoV-2 RNA is generally detectable in upper respiratory specimens during the acute phase of infection. The lowest concentration of SARS-CoV-2 viral copies this assay can detect is 138 copies/mL. A negative result does not preclude SARS-Cov-2 infection and should not be used as the sole basis for treatment or other patient management decisions. A negative result may occur with  improper specimen collection/handling, submission of specimen other than nasopharyngeal swab, presence of viral mutation(s) within the areas targeted by this assay, and inadequate number of viral copies(<138 copies/mL). A negative result must be combined with clinical observations, patient history, and epidemiological information. The expected result is Negative.  Fact Sheet for Patients:  EntrepreneurPulse.com.au  Fact Sheet for Healthcare Providers:  IncredibleEmployment.be  This test is no t yet approved or cleared by the [place] FDA and  has  been authorized for detection and/or diagnosis of SARS-CoV-2 by FDA under an Emergency Use Authorization (EUA). This EUA will remain  in effect (meaning this test can be used) for the duration of the COVID-19 declaration under Section 564(b)(1) of the Act, 21 U.S.C.section 360bbb-3(b)(1), unless the authorization is terminated  or revoked sooner.       Influenza A by PCR NEGATIVE NEGATIVE Final   Influenza B by PCR NEGATIVE NEGATIVE Final    Comment: (NOTE) The Xpert Xpress SARS-CoV-2/FLU/RSV plus assay is intended as an aid in the diagnosis of influenza from Nasopharyngeal swab specimens and should not be used as a sole basis for treatment. Nasal washings and aspirates are unacceptable for Xpert Xpress SARS-CoV-2/FLU/RSV testing.  Fact Sheet for Patients: EntrepreneurPulse.com.au  Fact Sheet for Healthcare Providers: IncredibleEmployment.be  This test is not yet approved or cleared by the [place] FDA and has been authorized for detection and/or diagnosis of SARS-CoV-2 by FDA under an Emergency Use Authorization (EUA). This EUA will remain in effect (meaning this test can be used) for the duration of the COVID-19 declaration under Section 564(b)(1) of the Act, 21 U.S.C. section 360bbb-3(b)(1), unless the authorization is terminated or revoked.  Performed at [facility], [place] [address], [place], [place] [address]   Culture, blood (routine x 2)     Status: Abnormal   Collection Time: 03/10/21  8:25 PM   Specimen: BLOOD  Result Value Ref Range Status   Specimen Description BLOOD LEFT ANTECUBITAL  Final   Special Requests   Final    BOTTLES DRAWN AEROBIC AND ANAEROBIC Blood Culture adequate volume   Culture  Setup Time   Final    GRAM POSITIVE  [facility] IN CLUSTERS CRITICAL RESULT CALLED TO, READ BACK BY AND VERIFIED WITH: PHARMD EDEN BREWINGTON 03/11/2021 @1932  BY JW IN BOTH AEROBIC AND ANAEROBIC BOTTLES    Culture (A)  Final     STAPHYLOCOCCUS EPIDERMIDIS THE SIGNIFICANCE OF ISOLATING THIS ORGANISM FROM A SINGLE SET OF BLOOD CULTURES WHEN MULTIPLE SETS ARE DRAWN IS UNCERTAIN. PLEASE NOTIFY THE MICROBIOLOGY DEPARTMENT WITHIN ONE WEEK IF SPECIATION AND SENSITIVITIES ARE REQUIRED. Performed at [facility], [place] [address], [place], [place] [address]    Report Status 03/13/2021 FINAL  Final  Blood Culture ID Panel (Reflexed)     Status: Abnormal   Collection Time: 03/10/21  8:25 PM  Result Value Ref Range Status   Enterococcus faecalis NOT DETECTED NOT DETECTED Final   Enterococcus Faecium NOT DETECTED NOT DETECTED Final   Listeria monocytogenes NOT DETECTED NOT DETECTED Final   Staphylococcus species DETECTED (A) NOT DETECTED Final    Comment: CRITICAL RESULT CALLED TO, READ BACK BY AND VERIFIED WITH: PHARMD EDEN BREWINGTON 03/11/2021 @1932  BY JW    Staphylococcus aureus (BCID) NOT DETECTED NOT DETECTED Final   Staphylococcus epidermidis DETECTED (A) NOT DETECTED Final    Comment: CRITICAL RESULT CALLED TO, READ BACK BY AND VERIFIED WITH: PHARMD EDEN BREWINGTON 03/11/2021 @1932  BY JW    Staphylococcus lugdunensis NOT DETECTED NOT DETECTED Final   Streptococcus species NOT DETECTED NOT DETECTED Final   Streptococcus agalactiae NOT DETECTED NOT DETECTED Final   Streptococcus pneumoniae NOT DETECTED NOT DETECTED Final   Streptococcus pyogenes NOT DETECTED NOT DETECTED Final   A.calcoaceticus-baumannii NOT DETECTED NOT DETECTED Final   Bacteroides fragilis NOT DETECTED NOT DETECTED Final   Enterobacterales NOT DETECTED NOT DETECTED Final   Enterobacter cloacae complex NOT DETECTED NOT DETECTED Final   Escherichia coli NOT DETECTED NOT DETECTED Final   Klebsiella aerogenes NOT DETECTED NOT DETECTED Final   Klebsiella oxytoca NOT DETECTED NOT DETECTED Final   Klebsiella pneumoniae NOT DETECTED NOT DETECTED Final   Proteus species NOT DETECTED NOT DETECTED Final   Salmonella species NOT DETECTED NOT DETECTED  Final   Serratia marcescens NOT DETECTED NOT DETECTED Final   Haemophilus influenzae NOT DETECTED NOT DETECTED Final   Neisseria meningitidis NOT DETECTED NOT DETECTED Final   Pseudomonas aeruginosa NOT DETECTED NOT DETECTED Final   Stenotrophomonas maltophilia NOT DETECTED NOT DETECTED Final   Candida albicans NOT DETECTED NOT DETECTED Final   Candida auris NOT DETECTED NOT DETECTED Final   Candida glabrata NOT DETECTED NOT DETECTED Final   Candida krusei NOT DETECTED NOT DETECTED Final   Candida parapsilosis NOT DETECTED NOT DETECTED Final   Candida tropicalis NOT DETECTED NOT DETECTED Final   Cryptococcus neoformans/gattii NOT DETECTED NOT DETECTED Final   Methicillin resistance mecA/C NOT DETECTED NOT DETECTED Final    Comment: Performed at [facility], [address] [place], [place] [address]  Culture, blood (routine x 2)     Status: None (Preliminary result)   Collection Time: 03/10/21  8:40 PM   Specimen: BLOOD RIGHT HAND  Result Value Ref Range Status   Specimen Description BLOOD RIGHT HAND  Final   Special Requests   Final    BOTTLES DRAWN AEROBIC AND ANAEROBIC Blood Culture adequate volume   Culture   Final    NO GROWTH 3 DAYS Performed at [facility], [place] [address] [place], [place] [address]    Report Status PENDING  Incomplete  MRSA Next Gen by PCR, Nasal     Status: None  Collection Time: 03/10/21 11:02 PM   Specimen: Nasal Mucosa; Nasal Swab  Result Value Ref Range Status   MRSA by PCR Next Gen NOT DETECTED NOT DETECTED Final    Comment: (NOTE) The GeneXpert MRSA Assay (FDA approved for NASAL specimens only), is one component of a comprehensive MRSA colonization surveillance program. It is not intended to diagnose MRSA infection nor to guide or monitor treatment for MRSA infections. Test performance is not FDA approved in patients less than 40 years old. Performed at [facility], [address], [place], [place] [address]   Respiratory  (~20 pathogens) panel by PCR     Status: None   Collection Time: 03/11/21  2:58 AM   Specimen: Nasopharyngeal Swab; Respiratory  Result Value Ref Range Status   Adenovirus NOT DETECTED NOT DETECTED Final   Coronavirus 229E NOT DETECTED NOT DETECTED Final    Comment: (NOTE) The Coronavirus on the Respiratory Panel, DOES NOT test for the novel  Coronavirus (2019 nCoV)    Coronavirus HKU1 NOT DETECTED NOT DETECTED Final   Coronavirus NL63 NOT DETECTED NOT DETECTED Final   Coronavirus OC43 NOT DETECTED NOT DETECTED Final   Metapneumovirus NOT DETECTED NOT DETECTED Final   Rhinovirus / Enterovirus NOT DETECTED NOT DETECTED Final   Influenza A NOT DETECTED NOT DETECTED Final   Influenza B NOT DETECTED NOT DETECTED Final   Parainfluenza Virus 1 NOT DETECTED NOT DETECTED Final   Parainfluenza Virus 2 NOT DETECTED NOT DETECTED Final   Parainfluenza Virus 3 NOT DETECTED NOT DETECTED Final   Parainfluenza Virus 4 NOT DETECTED NOT DETECTED Final   Respiratory Syncytial Virus NOT DETECTED NOT DETECTED Final   Bordetella pertussis NOT DETECTED NOT DETECTED Final   Bordetella Parapertussis NOT DETECTED NOT DETECTED Final   Chlamydophila pneumoniae NOT DETECTED NOT DETECTED Final   Mycoplasma pneumoniae NOT DETECTED NOT DETECTED Final    Comment: Performed at [facility], [address] [place], [place] [address]     Studies: DG Abd 1 View  Result Date: 03/12/2021 CLINICAL DATA:  OG tube placement EXAM: ABDOMEN - 1 VIEW COMPARISON:  None. FINDINGS: Enteric tube passes into the stomach. Bowel gas pattern is not well evaluated on this study. IMPRESSION: Enteric tube within stomach. Electronically Signed   By: Guadlupe Spanish M.D.   On: 03/12/2021 10:31      Joycelyn Das, MD  [facility] 03/14/2021  If 7PM-7AM, please contact night-coverage

## 2021-03-14 NOTE — TOC Progression Note (Signed)
Transition of Care [facility]) - Progression Note    Patient Details  Name: Zachary Hawkins MRN: [id number] Date of Birth: Dec 21, 1955  Transition of Care [facility]) CM/SW Contact  Bess Kinds, RN Phone Number: [phone] 03/14/2021, 4:19 PM  Clinical Narrative:     Order form for NIV and medical neceissity for NIV on shadow chart pending MD signature on both pages.  TOC to fax to [employer] for processing and insurance authorization once paperwork completed.   Expected Discharge Plan: Home w Home Health Services Barriers to Discharge: Continued Medical Work up  Expected Discharge Plan and Services Expected Discharge Plan: Home w Home Health Services In-house Referral: Clinical Social Work Discharge Planning Services: CM Consult Post Acute Care Choice: Durable Medical Equipment Living arrangements for the past 2 months: Single Family Home (staying at his sisters home-asked CSW to follow up with lights being cut out.)                 DME Arranged: NIV DME Agency: [employer] (trying to get information submitted to the insurance.) Date DME Agency Contacted: 03/13/21 Time DME Agency Contacted: 1255 Representative spoke with at DME Agency: Vaughan Basta             Social Determinants of Health (SDOH) Interventions    Readmission Risk Interventions Readmission Risk Prevention Plan 03/13/2021  Transportation Screening Complete  Medication Review [employer]) Complete  PCP or Specialist appointment within 3-5 days of discharge Complete  HRI or Home Care Consult Complete  SW Recovery Care/Counseling Consult Complete  Palliative Care Screening Not Applicable  Skilled Nursing Facility Complete  Some recent data might be hidden

## 2021-03-14 NOTE — Progress Notes (Signed)
Report given to Wanita Chamberlain RN at this time. All pt belongings gathered and pt transferred to 2C12.

## 2021-03-15 DIAGNOSIS — G4733 Obstructive sleep apnea (adult) (pediatric): Secondary | ICD-10-CM

## 2021-03-15 LAB — CULTURE, BLOOD (ROUTINE X 2)
Culture: NO GROWTH
Special Requests: ADEQUATE

## 2021-03-15 LAB — CBC
HCT: 43.5 % (ref 39.0–52.0)
Hemoglobin: 13.2 g/dL (ref 13.0–17.0)
MCH: 28.6 pg (ref 26.0–34.0)
MCHC: 30.3 g/dL (ref 30.0–36.0)
MCV: 94.4 fL (ref 80.0–100.0)
Platelets: 219 10*3/uL (ref 150–400)
RBC: 4.61 MIL/uL (ref 4.22–5.81)
RDW: 17.1 % — ABNORMAL HIGH (ref 11.5–15.5)
WBC: 9.2 10*3/uL (ref 4.0–10.5)
nRBC: 0 % (ref 0.0–0.2)

## 2021-03-15 LAB — BASIC METABOLIC PANEL
Anion gap: 6 (ref 5–15)
BUN: 25 mg/dL — ABNORMAL HIGH (ref 8–23)
CO2: 29 mmol/L (ref 22–32)
Calcium: 8.2 mg/dL — ABNORMAL LOW (ref 8.9–10.3)
Chloride: 101 mmol/L (ref 98–111)
Creatinine, Ser: 1.13 mg/dL (ref 0.61–1.24)
GFR, Estimated: >60 mL/min (ref >60)
Glucose, Bld: 129 mg/dL — ABNORMAL HIGH (ref 70–99)
Potassium: 4.7 mmol/L (ref 3.5–5.1)
Sodium: 136 mmol/L (ref 135–145)

## 2021-03-15 LAB — MAGNESIUM: Magnesium: 2.4 mg/dL (ref 1.7–2.4)

## 2021-03-15 MED ORDER — COVID-19MRNA BIVAL VACC PFIZER 30 MCG/0.3ML IM SUSP
0.3000 mL | Freq: Once | INTRAMUSCULAR | Status: AC
  Administered 2021-03-16: 0.3 mL via INTRAMUSCULAR
  Filled 2021-03-15: qty 0.3

## 2021-03-15 NOTE — Progress Notes (Signed)
Mobility Specialist Progress Note:   03/15/21 1220  Mobility  Activity Ambulated in hall  Level of Assistance Standby assist, set-up cues, supervision of patient - no hands on  Assistive Device Four wheel walker  Distance Ambulated (ft) 370 ft  Mobility Ambulated with assistance in hallway  Mobility Response Tolerated well  Mobility performed by Mobility specialist  $Mobility charge 1 Mobility   Pt received in bed willing to participate in mobility. No complaints of pain, a little SOB during ambulation but SpO2 remained above 93% on 6L O2. Pt returned to EOB with call bell in reach and all needs met.   [facility] Health and safety inspector Phone [phone]

## 2021-03-15 NOTE — Progress Notes (Signed)
PROGRESS NOTE  Zachary Hawkins [id number] DOB: 04/05/1956 DOA: 03/10/2021 PCP: System, Provider Not In   LOS: 5 days   Brief narrative:  Patient is a 65 years old male with past medical history of atrial fibrillation on Eliquis, sleep apnea not on CPAP due to cost factor, history of heart failure with reduced ejection, ACE inhibitor induced edema, presented hospital with  increased somnolence.  He stated that his oxygen was malfunctioning at home.  He was brought into the hospital with and was noted to have hypoxia with a pulse ox of 70% on room air and was placed on CPAP and transitioned to BiPAP in the ED.  Patient remained somnolent so was admitted to the ICU.  Initial ABG was pH of 7.2 with a PCO2 of 94.  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  Patient was supposed to be on CPAP at home but was not on it due to cost and lack of insurance.  Patient was initially intubated on 03/11/21 and mechanically ventilated but was subsequently extubated on 11/ 3/22.  At this time, patient is on supplemental oxygen at 6 L/min..  Would highly benefit from NIV at home.  Transition of care on board.  Will need outpatient pulmonary follow-up with Dr. Doyle Askew, Pulmonary and PFT.  Continue albuterol nebulizer, prednisone taper, antibiotic with Rocephin and Zithromax..  Essential HTN Continue bisoprolol, amlodipine isosorbide, Lasix and spironolactone patient has remained stable.   Atrial fibrillation on Eliquis On bisoprolol.  Continue Eliquis.  Heart rate is stable.  AKI on CKD III Latest creatinine of 1.1.  Closely monitor on diuretic.   GERD  Continue Protonix for now especially on steroids.   Inadequate PO intake On oral diet at this time.  Morbid obesity.   Patient would benefit  from weight loss as outpatient.  DVT prophylaxis:  apixaban (ELIQUIS) tablet 5 mg   Code Status: Full code  Family Communication:   I again tried to reach the patient's brother on the phone but was unable to reach him.  Status is: Inpatient  Remains inpatient appropriate because: Respiratory failure requiring noninvasive ventilation,  Consultants: PCCM  Procedures: Intubation and mechanical ventilation  Anti-infectives:  IV Rocephin 11/ 3> IV Zithromax 11/3>  Anti-infectives (From admission, onward)    Start     Dose/Rate Route Frequency Ordered Stop   03/12/21 1200  cefTRIAXone (ROCEPHIN) 1 g in sodium chloride 0.9 % 100 mL IVPB        1 g 200 mL/hr over 30 Minutes Intravenous Every 24 hours 03/12/21 0944 03/16/21 1159   03/11/21 1530  ceFEPIme (MAXIPIME) 2 g in sodium chloride 0.9 % 100 mL IVPB  Status:  Discontinued        2 g 200 mL/hr over 30 Minutes Intravenous Every 8 hours 03/11/21 1436 03/12/21 0944   03/11/21 1400  azithromycin (ZITHROMAX) 500 mg in sodium chloride 0.9 % 250 mL IVPB        500 mg 250 mL/hr over 60 Minutes Intravenous Every 24 hours 03/11/21 1143 03/16/21 1359      Subjective:  Today, patient was seen and examined at bedside.  Patient denies any nausea vomiting but has mild shortness of breath and cough.  Denies chest pain.  Objective: Vitals:   03/15/21 0728 03/15/21 0748  BP:  118/82  Pulse: 72 79  Resp: 16 19  Temp:  97.8 F (36.6 C)  SpO2: 94% 95%    Intake/Output Summary (Last 24 hours) at 03/15/2021 0822 Last data filed at 03/15/2021 0700 Gross per 24 hour  Intake 500 ml  Output 675 ml  Net -175 ml    Filed Weights   03/13/21 0647 03/14/21 0600 03/15/21 0650  Weight: (!) 141.6 kg (!) 145.6 kg (!) 145.8 kg   Body mass index is 42.41 kg/m.   Physical Exam:  GENERAL: Patient is alert awake and oriented. Not in obvious distress.  On 6 L of oxygen by nasal cannula, morbidly obese HENT: No scleral pallor or icterus.  Pupils equally reactive to light. Oral mucosa is moist NECK: is supple, no gross swelling noted. CHEST:  Diminished breath sounds bilaterally. CVS: S1 and S2 heard, no murmur. Regular rate and rhythm.  ABDOMEN: Soft, non-tender, bowel sounds are present.  Bilateral hydrocele noted. EXTREMITIES: Bilateral lower extremity trace edema noted. CNS: Cranial nerves are intact. No focal motor deficits. SKIN: warm and dry without rashes.  Data Review: I have personally reviewed the following laboratory data and studies,  CBC: Recent Labs  Lab 03/10/21 2005 03/10/21 2116 03/11/21 0206 03/11/21 0539 03/12/21 0510 03/12/21 0557 03/12/21 0621 03/13/21 0347 03/13/21 1530 03/15/21 0018  WBC 8.5  --  7.1  --  9.1  --   --  14.4*  --  9.2  NEUTROABS 4.3  --   --   --   --   --   --   --   --   --   HGB 14.0   < > 14.0   < > 15.6 11.6* 16.3 14.3 15.6 13.2  HCT 46.3   < > 45.9   < > 48.9 34.0* 48.0 43.9 46.0 43.5  MCV 96.1  --  95.4  --  90.2  --   --  90.5  --  94.4  PLT 208  --  198  --  243  --   --  250  --  219   < > = values in this interval not displayed.    Basic Metabolic Panel: Recent Labs  Lab 03/11/21 2019 03/12/21 0510 03/12/21 0557 03/12/21 0621 03/13/21 0347 03/13/21 1530 03/14/21 0231 03/15/21 0018  NA 137 137   < > 137 136 137 134* 136  K 4.6 4.1   < > 4.6 4.0 4.8 4.6 4.7  CL 98 100  --   --  96*  --  98 101  CO2 28 20*  --   --  30  --  30 29  GLUCOSE 141* 169*  --   --  105*  --  92 129*  BUN 23 28*  --   --  38*  --  34* 25*  CREATININE 1.72* 1.59*  --   --  1.59*  --  1.25* 1.13  CALCIUM 9.2 9.2  --   --  8.6*  --  8.3* 8.2*  MG 2.5*  --   --   --  2.5*  --   --  2.4   < > = values in this interval not displayed.    Liver Function Tests: No results for input(s): AST, ALT, ALKPHOS, BILITOT, PROT, ALBUMIN in the last 168 hours. No results for input(s): LIPASE, AMYLASE in the last 168 hours. No results for input(s): AMMONIA in the last 168 hours. Cardiac  Enzymes: No results for input(s): CKTOTAL, CKMB, CKMBINDEX, TROPONINI in the last 168 hours. BNP (  last 3 results) Recent Labs    12/16/20 0022 02/12/21 0216 03/10/21 2005  BNP 29.6 242.1* 28.6     ProBNP (last 3 results) No results for input(s): PROBNP in the last 8760 hours.  CBG: Recent Labs  Lab 03/12/21 0333 03/12/21 0824 03/12/21 1106 03/12/21 1646 03/12/21 2117  GLUCAP 155* 149* 163* 149* 154*    Recent Results (from the past 240 hour(s))  Resp Panel by RT-PCR (Flu A&B, Covid) Nasopharyngeal Swab     Status: None   Collection Time: 03/10/21  8:06 PM   Specimen: Nasopharyngeal Swab; Nasopharyngeal(NP) swabs in vial transport medium  Result Value Ref Range Status   SARS Coronavirus 2 by RT PCR NEGATIVE NEGATIVE Final    Comment: (NOTE) SARS-CoV-2 target nucleic acids are NOT DETECTED.  The SARS-CoV-2 RNA is generally detectable in upper respiratory specimens during the acute phase of infection. The lowest concentration of SARS-CoV-2 viral copies this assay can detect is 138 copies/mL. A negative result does not preclude SARS-Cov-2 infection and should not be used as the sole basis for treatment or other patient management decisions. A negative result may occur with  improper specimen collection/handling, submission of specimen other than nasopharyngeal swab, presence of viral mutation(s) within the areas targeted by this assay, and inadequate number of viral copies(<138 copies/mL). A negative result must be combined with clinical observations, patient history, and epidemiological information. The expected result is Negative.  Fact Sheet for Patients:  EntrepreneurPulse.com.au  Fact Sheet for Healthcare Providers:  IncredibleEmployment.be  This test is no t yet approved or cleared by the [place] FDA and  has been authorized for detection and/or diagnosis of SARS-CoV-2 by FDA under an Emergency Use Authorization (EUA).  This EUA will remain  in effect (meaning this test can be used) for the duration of the COVID-19 declaration under Section 564(b)(1) of the Act, 21 U.S.C.section 360bbb-3(b)(1), unless the authorization is terminated  or revoked sooner.       Influenza A by PCR NEGATIVE NEGATIVE Final   Influenza B by PCR NEGATIVE NEGATIVE Final    Comment: (NOTE) The Xpert Xpress SARS-CoV-2/FLU/RSV plus assay is intended as an aid in the diagnosis of influenza from Nasopharyngeal swab specimens and should not be used as a sole basis for treatment. Nasal washings and aspirates are unacceptable for Xpert Xpress SARS-CoV-2/FLU/RSV testing.  Fact Sheet for Patients: EntrepreneurPulse.com.au  Fact Sheet for Healthcare Providers: IncredibleEmployment.be  This test is not yet approved or cleared by the [place] FDA and has been authorized for detection and/or diagnosis of SARS-CoV-2 by FDA under an Emergency Use Authorization (EUA). This EUA will remain in effect (meaning this test can be used) for the duration of the COVID-19 declaration under Section 564(b)(1) of the Act, 21 U.S.C. section 360bbb-3(b)(1), unless the authorization is terminated or revoked.  Performed at [facility], [place] [address] [place], [place] [address]   Culture, blood (routine x 2)     Status: Abnormal   Collection Time: 03/10/21  8:25 PM   Specimen: BLOOD  Result Value Ref Range Status   Specimen Description BLOOD LEFT ANTECUBITAL  Final   Special Requests   Final    BOTTLES DRAWN AEROBIC AND ANAEROBIC Blood Culture adequate volume   Culture  Setup Time   Final    GRAM POSITIVE COCCI IN CLUSTERS CRITICAL RESULT CALLED TO, READ BACK BY AND VERIFIED WITH: PHARMD EDEN BREWINGTON 03/11/2021 @1932  BY JW IN BOTH AEROBIC AND ANAEROBIC BOTTLES    Culture (A)  Final    STAPHYLOCOCCUS EPIDERMIDIS THE SIGNIFICANCE OF ISOLATING THIS ORGANISM FROM A SINGLE SET OF BLOOD CULTURES WHEN  MULTIPLE SETS ARE DRAWN IS UNCERTAIN. PLEASE NOTIFY THE MICROBIOLOGY DEPARTMENT WITHIN ONE WEEK IF SPECIATION AND SENSITIVITIES ARE REQUIRED. Performed at [facility], [place] [address], [place], [place] [address]    Report Status 03/13/2021 FINAL  Final  Blood Culture ID Panel (Reflexed)     Status: Abnormal   Collection Time: 03/10/21  8:25 PM  Result Value Ref Range Status   Enterococcus faecalis NOT DETECTED NOT DETECTED Final   Enterococcus Faecium NOT DETECTED NOT DETECTED Final   Listeria monocytogenes NOT DETECTED NOT DETECTED Final   Staphylococcus species DETECTED (A) NOT DETECTED Final    Comment: CRITICAL RESULT CALLED TO, READ BACK BY AND VERIFIED WITH: PHARMD EDEN BREWINGTON 03/11/2021 @1932  BY JW    Staphylococcus aureus (BCID) NOT DETECTED NOT DETECTED Final   Staphylococcus epidermidis DETECTED (A) NOT DETECTED Final    Comment: CRITICAL RESULT CALLED TO, READ BACK BY AND VERIFIED WITH: PHARMD EDEN BREWINGTON 03/11/2021 @1932  BY JW    Staphylococcus lugdunensis NOT DETECTED NOT DETECTED Final   Streptococcus species NOT DETECTED NOT DETECTED Final   Streptococcus agalactiae NOT DETECTED NOT DETECTED Final   Streptococcus pneumoniae NOT DETECTED NOT DETECTED Final   Streptococcus pyogenes NOT DETECTED NOT DETECTED Final   A.calcoaceticus-baumannii NOT DETECTED NOT DETECTED Final   Bacteroides fragilis NOT DETECTED NOT DETECTED Final   Enterobacterales NOT DETECTED NOT DETECTED Final   Enterobacter cloacae complex NOT DETECTED NOT DETECTED Final   Escherichia coli NOT DETECTED NOT DETECTED Final   Klebsiella aerogenes NOT DETECTED NOT DETECTED Final   Klebsiella oxytoca NOT DETECTED NOT DETECTED Final   Klebsiella pneumoniae NOT DETECTED NOT DETECTED Final   Proteus species NOT DETECTED NOT DETECTED Final   Salmonella species NOT DETECTED NOT DETECTED Final   Serratia marcescens NOT DETECTED NOT DETECTED Final   Haemophilus influenzae NOT DETECTED NOT DETECTED  Final   Neisseria meningitidis NOT DETECTED NOT DETECTED Final   Pseudomonas aeruginosa NOT DETECTED NOT DETECTED Final   Stenotrophomonas maltophilia NOT DETECTED NOT DETECTED Final   Candida albicans NOT DETECTED NOT DETECTED Final   Candida auris NOT DETECTED NOT DETECTED Final   Candida glabrata NOT DETECTED NOT DETECTED Final   Candida krusei NOT DETECTED NOT DETECTED Final   Candida parapsilosis NOT DETECTED NOT DETECTED Final   Candida tropicalis NOT DETECTED NOT DETECTED Final   Cryptococcus neoformans/gattii NOT DETECTED NOT DETECTED Final   Methicillin resistance mecA/C NOT DETECTED NOT DETECTED Final    Comment: Performed at [facility], [address], [place], [place] [address]  Culture, blood (routine x 2)     Status: None (Preliminary result)   Collection Time: 03/10/21  8:40 PM   Specimen: BLOOD RIGHT HAND  Result Value Ref Range Status   Specimen Description BLOOD RIGHT HAND  Final   Special Requests   Final    BOTTLES DRAWN AEROBIC AND ANAEROBIC Blood Culture adequate volume   Culture   Final    NO GROWTH 4 DAYS Performed at [facility], [place] [address], [place], [place] [address]    Report Status PENDING  Incomplete  MRSA Next Gen by PCR, Nasal     Status: None   Collection Time: 03/10/21 11:02 PM   Specimen: Nasal Mucosa; Nasal Swab  Result Value Ref Range Status   MRSA by PCR Next Gen NOT DETECTED NOT DETECTED Final  Comment: (NOTE) The GeneXpert MRSA Assay (FDA approved for NASAL specimens only), is one component of a comprehensive MRSA colonization surveillance program. It is not intended to diagnose MRSA infection nor to guide or monitor treatment for MRSA infections. Test performance is not FDA approved in patients less than 35 years old. Performed at [facility], [address] [place], [place] [address]   Respiratory (~20 pathogens) panel by PCR     Status: None   Collection Time: 03/11/21  2:58 AM   Specimen: Nasopharyngeal  Swab; Respiratory  Result Value Ref Range Status   Adenovirus NOT DETECTED NOT DETECTED Final   Coronavirus 229E NOT DETECTED NOT DETECTED Final    Comment: (NOTE) The Coronavirus on the Respiratory Panel, DOES NOT test for the novel  Coronavirus (2019 nCoV)    Coronavirus HKU1 NOT DETECTED NOT DETECTED Final   Coronavirus NL63 NOT DETECTED NOT DETECTED Final   Coronavirus OC43 NOT DETECTED NOT DETECTED Final   Metapneumovirus NOT DETECTED NOT DETECTED Final   Rhinovirus / Enterovirus NOT DETECTED NOT DETECTED Final   Influenza A NOT DETECTED NOT DETECTED Final   Influenza B NOT DETECTED NOT DETECTED Final   Parainfluenza Virus 1 NOT DETECTED NOT DETECTED Final   Parainfluenza Virus 2 NOT DETECTED NOT DETECTED Final   Parainfluenza Virus 3 NOT DETECTED NOT DETECTED Final   Parainfluenza Virus 4 NOT DETECTED NOT DETECTED Final   Respiratory Syncytial Virus NOT DETECTED NOT DETECTED Final   Bordetella pertussis NOT DETECTED NOT DETECTED Final   Bordetella Parapertussis NOT DETECTED NOT DETECTED Final   Chlamydophila pneumoniae NOT DETECTED NOT DETECTED Final   Mycoplasma pneumoniae NOT DETECTED NOT DETECTED Final    Comment: Performed at [facility], [address] [place], [place] [address]      Studies: No results found.   Joycelyn Das, MD  [facility] 03/15/2021  If 7PM-7AM, please contact night-coverage

## 2021-03-16 ENCOUNTER — Ambulatory Visit: Payer: Medicaid Other | Admitting: Physician Assistant

## 2021-03-16 DIAGNOSIS — Z4659 Encounter for fitting and adjustment of other gastrointestinal appliance and device: Secondary | ICD-10-CM

## 2021-03-16 DIAGNOSIS — I5032 Chronic diastolic (congestive) heart failure: Secondary | ICD-10-CM

## 2021-03-16 DIAGNOSIS — I509 Heart failure, unspecified: Secondary | ICD-10-CM

## 2021-03-16 DIAGNOSIS — J9602 Acute respiratory failure with hypercapnia: Secondary | ICD-10-CM

## 2021-03-16 LAB — MAGNESIUM: Magnesium: 2.3 mg/dL (ref 1.7–2.4)

## 2021-03-16 LAB — CBC
HCT: 44.5 % (ref 39.0–52.0)
Hemoglobin: 13.4 g/dL (ref 13.0–17.0)
MCH: 29 pg (ref 26.0–34.0)
MCHC: 30.1 g/dL (ref 30.0–36.0)
MCV: 96.3 fL (ref 80.0–100.0)
Platelets: 214 10*3/uL (ref 150–400)
RBC: 4.62 MIL/uL (ref 4.22–5.81)
RDW: 16.7 % — ABNORMAL HIGH (ref 11.5–15.5)
WBC: 9.8 10*3/uL (ref 4.0–10.5)
nRBC: 0 % (ref 0.0–0.2)

## 2021-03-16 LAB — BASIC METABOLIC PANEL
Anion gap: 5 (ref 5–15)
BUN: 19 mg/dL (ref 8–23)
CO2: 33 mmol/L — ABNORMAL HIGH (ref 22–32)
Calcium: 8.5 mg/dL — ABNORMAL LOW (ref 8.9–10.3)
Chloride: 97 mmol/L — ABNORMAL LOW (ref 98–111)
Creatinine, Ser: 1.19 mg/dL (ref 0.61–1.24)
GFR, Estimated: >60 mL/min (ref >60)
Glucose, Bld: 139 mg/dL — ABNORMAL HIGH (ref 70–99)
Potassium: 4.7 mmol/L (ref 3.5–5.1)
Sodium: 135 mmol/L (ref 135–145)

## 2021-03-16 MED ORDER — BISOPROLOL FUMARATE 5 MG PO TABS: 5.0000 mg | ORAL_TABLET | Freq: Every day | ORAL | 3 refills | Status: DC

## 2021-03-16 MED ORDER — SPIRONOLACTONE 25 MG PO TABS: 25.0000 mg | ORAL_TABLET | Freq: Every day | ORAL | 2 refills | Status: DC

## 2021-03-16 MED ORDER — PREDNISONE 20 MG PO TABS: 40.0000 mg | ORAL_TABLET | Freq: Every day | ORAL | 0 refills | Status: DC

## 2021-03-16 MED ORDER — FUROSEMIDE 80 MG PO TABS: 80.0000 mg | ORAL_TABLET | Freq: Every day | ORAL | 2 refills | Status: DC

## 2021-03-16 MED ORDER — TAMSULOSIN HCL 0.4 MG PO CAPS: 0.4000 mg | ORAL_CAPSULE | Freq: Every day | ORAL | 2 refills | Status: DC

## 2021-03-16 MED ORDER — PREDNISONE 20 MG PO TABS: 20.0000 mg | ORAL_TABLET | Freq: Every day | ORAL | 0 refills | Status: AC

## 2021-03-16 NOTE — Progress Notes (Addendum)
PROGRESS NOTE  Zachary Hawkins L Kady ONG:[id number] DOB: [birth year] DOA: 03/10/2021 PCP: System, Provider Not In   LOS: 6 days   Brief narrative:  Patient is a 65 years old male with past medical history of atrial fibrillation on Eliquis, sleep apnea not on CPAP due to cost factor, history of heart failure with reduced ejection, ACE inhibitor induced edema, presented hospital with  increased somnolence.  He stated that his oxygen was malfunctioning at home.  He was brought into the hospital with and was noted to have hypoxia with a pulse ox of 70% on room air and was placed on CPAP and transitioned to BiPAP in the ED.  Patient remained somnolent so was admitted to the ICU.  Initial ABG was pH of 7.2 with a PCO2 of 94.  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 Patient was supposed to be on CPAP at home but was not on it due to cost and lack of insurance.  Patient was initially intubated on 03/11/21 and mechanically ventilated but was subsequently extubated on 03/12/21.  At this time, patient is on supplemental oxygen at 6 L/min.  Would highly benefit from NIV at home.  Transition of care on board.  Will need outpatient pulmonary follow-up with Dr. Izola PriceMyers, Pulmonary with outpatient PFT.  Continue albuterol nebulizer, prednisone taper. Patient has completed antibiotic course with Rocephin and Zithromax.  Patient will need oxygen on discharge.  Essential HTN Continue bisoprolol, amlodipine, isosorbide, Lasix and spironolactone. Patient has remained stable.   Atrial fibrillation on Eliquis On bisoprolol, Eliquis.  Heart rate is stable.  AKI on CKD III Latest creatinine of 1.1.  Closely monitor on diuretic.   GERD  Continue Protonix for now especially on steroids.   Inadequate PO intake On  oral diet at this time.  Morbid obesity.   Patient would benefit from weight loss as outpatient.  DVT prophylaxis:  apixaban (ELIQUIS) tablet 5 mg   Code Status: Full code  Family Communication:  Spoke with patient at bedside.  Status is: Inpatient  Remains inpatient appropriate because: Respiratory failure requiring noninvasive ventilation, awaiting for set up.  Consultants: PCCM  Procedures: Intubation and mechanical ventilation  Anti-infectives:  IV Rocephin 11/ 3> IV Zithromax 11/3>  Anti-infectives (From admission, onward)    Start     Dose/Rate Route Frequency Ordered Stop   03/12/21 1200  cefTRIAXone (ROCEPHIN) 1 g in sodium chloride 0.9 % 100 mL IVPB        1 g 200 mL/hr over 30 Minutes Intravenous Every 24 hours 03/12/21 0944 03/15/21 1450   03/11/21 1530  ceFEPIme (MAXIPIME) 2 g in sodium chloride 0.9 % 100 mL IVPB  Status:  Discontinued        2 g 200 mL/hr over 30 Minutes Intravenous Every 8 hours 03/11/21 1436 03/12/21 0944   03/11/21 1400  azithromycin (ZITHROMAX) 500 mg in sodium chloride 0.9 % 250 mL IVPB        500 mg 250 mL/hr over 60 Minutes Intravenous Every 24 hours 03/11/21 1143 03/15/21 1635      Subjective: Today, patient was seen and examined at bedside.  Patient has mild cough but overall shortness of breath has improved.    Objective: Vitals:   03/16/21 0724 03/16/21 0829  BP: 136/85   Pulse: 77   Resp: 20   Temp: 98  F (36.7 C)   SpO2: 94% 96%    Intake/Output Summary (Last 24 hours) at 03/16/2021 1012 Last data filed at 03/16/2021 0900 Gross per 24 hour  Intake 284.95 ml  Output 1800 ml  Net -1515.05 ml    Filed Weights   03/14/21 0600 03/15/21 0650 03/16/21 0559  Weight: (!) 145.6 kg (!) 145.8 kg (!) 145.8 kg   Body mass index is 42.41 kg/m.   Physical Exam: General: Morbidly obese built, not in obvious distress, on 6 L oxygen by nasal cannula HENT:   No scleral pallor or icterus noted. Oral mucosa is moist.   Chest:  Diminished breath sounds bilaterally. No crackles or wheezes.  CVS: S1 &S2 heard. No murmur.  Regular rate and rhythm. Abdomen: Soft, nontender, nondistended.  Bowel sounds are heard.   Extremities: No cyanosis, clubbing trace edema, peripheral pulses are palpable. Psych: Alert, awake and oriented, normal mood CNS:  No cranial nerve deficits.  Power equal in all extremities.   Skin: Warm and dry.  No rashes noted.  Data Review: I have personally reviewed the following laboratory data and studies,  CBC: Recent Labs  Lab 03/10/21 2005 03/10/21 2116 03/11/21 0206 03/11/21 0539 03/12/21 0510 03/12/21 0557 03/12/21 0621 03/13/21 0347 03/13/21 1530 03/15/21 0018 03/16/21 0216  WBC 8.5  --  7.1  --  9.1  --   --  14.4*  --  9.2 9.8  NEUTROABS 4.3  --   --   --   --   --   --   --   --   --   --   HGB 14.0   < > 14.0   < > 15.6   < > 16.3 14.3 15.6 13.2 13.4  HCT 46.3   < > 45.9   < > 48.9   < > 48.0 43.9 46.0 43.5 44.5  MCV 96.1  --  95.4  --  90.2  --   --  90.5  --  94.4 96.3  PLT 208  --  198  --  243  --   --  250  --  219 214   < > = values in this interval not displayed.    Basic Metabolic Panel: Recent Labs  Lab 03/11/21 2019 03/12/21 0510 03/12/21 0557 03/13/21 0347 03/13/21 1530 03/14/21 0231 03/15/21 0018 03/16/21 0216  NA 137 137   < > 136 137 134* 136 135  K 4.6 4.1   < > 4.0 4.8 4.6 4.7 4.7  CL 98 100  --  96*  --  98 101 97*  CO2 28 20*  --  30  --  30 29 33*  GLUCOSE 141* 169*  --  105*  --  92 129* 139*  BUN 23 28*  --  38*  --  34* 25* 19  CREATININE 1.72* 1.59*  --  1.59*  --  1.25* 1.13 1.19  CALCIUM 9.2 9.2  --  8.6*  --  8.3* 8.2* 8.5*  MG 2.5*  --   --  2.5*  --   --  2.4 2.3   < > = values in this interval not displayed.    Liver Function Tests: No results for input(s): AST, ALT, ALKPHOS, BILITOT, PROT, ALBUMIN in the last 168 hours. No results for input(s): LIPASE, AMYLASE in the last 168 hours. No results for input(s): AMMONIA in  the last 168 hours. Cardiac Enzymes: No results for input(s): CKTOTAL, CKMB, CKMBINDEX, TROPONINI in the last 168 hours.  BNP (last 3 results) Recent Labs    12/16/20 0022 02/12/21 0216 03/10/21 2005  BNP 29.6 242.1* 28.6     ProBNP (last 3 results) No results for input(s): PROBNP in the last 8760 hours.  CBG: Recent Labs  Lab 03/12/21 0333 03/12/21 0824 03/12/21 1106 03/12/21 1646 03/12/21 2117  GLUCAP 155* 149* 163* 149* 154*    Recent Results (from the past 240 hour(s))  Resp Panel by RT-PCR (Flu A&B, Covid) Nasopharyngeal Swab     Status: None   Collection Time: 03/10/21  8:06 PM   Specimen: Nasopharyngeal Swab; Nasopharyngeal(NP) swabs in vial transport medium  Result Value Ref Range Status   SARS Coronavirus 2 by RT PCR NEGATIVE NEGATIVE Final    Comment: (NOTE) SARS-CoV-2 target nucleic acids are NOT DETECTED.  The SARS-CoV-2 RNA is generally detectable in upper respiratory specimens during the acute phase of infection. The lowest concentration of SARS-CoV-2 viral copies this assay can detect is 138 copies/mL. A negative result does not preclude SARS-Cov-2 infection and should not be used as the sole basis for treatment or other patient management decisions. A negative result may occur with  improper specimen collection/handling, submission of specimen other than nasopharyngeal swab, presence of viral mutation(s) within the areas targeted by this assay, and inadequate number of viral copies(<138 copies/mL). A negative result must be combined with clinical observations, patient history, and epidemiological information. The expected result is Negative.  Fact Sheet for Patients:  BloggerCourse.com  Fact Sheet for Healthcare Providers:  SeriousBroker.it  This test is no t yet approved or cleared by the [place] FDA and  has been authorized for detection and/or diagnosis of SARS-CoV-2 by FDA under an  Emergency Use Authorization (EUA). This EUA will remain  in effect (meaning this test can be used) for the duration of the COVID-19 declaration under Section 564(b)(1) of the Act, 21 U.S.C.section 360bbb-3(b)(1), unless the authorization is terminated  or revoked sooner.       Influenza A by PCR NEGATIVE NEGATIVE Final   Influenza B by PCR NEGATIVE NEGATIVE Final    Comment: (NOTE) The Xpert Xpress SARS-CoV-2/FLU/RSV plus assay is intended as an aid in the diagnosis of influenza from Nasopharyngeal swab specimens and should not be used as a sole basis for treatment. Nasal washings and aspirates are unacceptable for Xpert Xpress SARS-CoV-2/FLU/RSV testing.  Fact Sheet for Patients: BloggerCourse.com  Fact Sheet for Healthcare Providers: SeriousBroker.it  This test is not yet approved or cleared by the [place] FDA and has been authorized for detection and/or diagnosis of SARS-CoV-2 by FDA under an Emergency Use Authorization (EUA). This EUA will remain in effect (meaning this test can be used) for the duration of the COVID-19 declaration under Section 564(b)(1) of the Act, 21 U.S.C. section 360bbb-3(b)(1), unless the authorization is terminated or revoked.  Performed at [facility], [address], [place], [place] [address]   Culture, blood (routine x 2)     Status: Abnormal   Collection Time: 03/10/21  8:25 PM   Specimen: BLOOD  Result Value Ref Range Status   Specimen Description BLOOD LEFT ANTECUBITAL  Final   Special Requests   Final    BOTTLES DRAWN AEROBIC AND ANAEROBIC Blood Culture adequate volume   Culture  Setup Time   Final    GRAM POSITIVE COCCI IN CLUSTERS CRITICAL RESULT CALLED TO, READ BACK BY AND VERIFIED WITH: PHARMD EDEN BREWINGTON 03/11/2021 @1932  BY JW IN BOTH AEROBIC AND ANAEROBIC BOTTLES    Culture (A)  Final    STAPHYLOCOCCUS EPIDERMIDIS THE SIGNIFICANCE OF ISOLATING THIS ORGANISM FROM A  SINGLE SET OF BLOOD CULTURES WHEN MULTIPLE SETS ARE DRAWN IS UNCERTAIN. PLEASE NOTIFY THE MICROBIOLOGY DEPARTMENT WITHIN ONE WEEK IF SPECIATION AND SENSITIVITIES ARE REQUIRED. Performed at [facility], [address] [place], [place] [address]    Report Status 03/13/2021 FINAL  Final  Blood Culture ID Panel (Reflexed)     Status: Abnormal   Collection Time: 03/10/21  8:25 PM  Result Value Ref Range Status   Enterococcus faecalis NOT DETECTED NOT DETECTED Final   Enterococcus Faecium NOT DETECTED NOT DETECTED Final   Listeria monocytogenes NOT DETECTED NOT DETECTED Final   Staphylococcus species DETECTED (A) NOT DETECTED Final    Comment: CRITICAL RESULT CALLED TO, READ BACK BY AND VERIFIED WITH: PHARMD EDEN BREWINGTON 03/11/2021 @1932  BY JW    Staphylococcus aureus (BCID) NOT DETECTED NOT DETECTED Final   Staphylococcus epidermidis DETECTED (A) NOT DETECTED Final    Comment: CRITICAL RESULT CALLED TO, READ BACK BY AND VERIFIED WITH: PHARMD EDEN BREWINGTON 03/11/2021 @1932  BY JW    Staphylococcus lugdunensis NOT DETECTED NOT DETECTED Final   Streptococcus species NOT DETECTED NOT DETECTED Final   Streptococcus agalactiae NOT DETECTED NOT DETECTED Final   Streptococcus pneumoniae NOT DETECTED NOT DETECTED Final   Streptococcus pyogenes NOT DETECTED NOT DETECTED Final   A.calcoaceticus-baumannii NOT DETECTED NOT DETECTED Final   Bacteroides fragilis NOT DETECTED NOT DETECTED Final   Enterobacterales NOT DETECTED NOT DETECTED Final   Enterobacter cloacae complex NOT DETECTED NOT DETECTED Final   Escherichia coli NOT DETECTED NOT DETECTED Final   Klebsiella aerogenes NOT DETECTED NOT DETECTED Final   Klebsiella oxytoca NOT DETECTED NOT DETECTED Final   Klebsiella pneumoniae NOT DETECTED NOT DETECTED Final   Proteus species NOT DETECTED NOT DETECTED Final   Salmonella species NOT DETECTED NOT DETECTED Final   Serratia marcescens NOT DETECTED NOT DETECTED Final   Haemophilus  influenzae NOT DETECTED NOT DETECTED Final   Neisseria meningitidis NOT DETECTED NOT DETECTED Final   Pseudomonas aeruginosa NOT DETECTED NOT DETECTED Final   Stenotrophomonas maltophilia NOT DETECTED NOT DETECTED Final   Candida albicans NOT DETECTED NOT DETECTED Final   Candida auris NOT DETECTED NOT DETECTED Final   Candida glabrata NOT DETECTED NOT DETECTED Final   Candida krusei NOT DETECTED NOT DETECTED Final   Candida parapsilosis NOT DETECTED NOT DETECTED Final   Candida tropicalis NOT DETECTED NOT DETECTED Final   Cryptococcus neoformans/gattii NOT DETECTED NOT DETECTED Final   Methicillin resistance mecA/C NOT DETECTED NOT DETECTED Final    Comment: Performed at [facility], [address] [place], [place] [address]  Culture, blood (routine x 2)     Status: None   Collection Time: 03/10/21  8:40 PM   Specimen: BLOOD RIGHT HAND  Result Value Ref Range Status   Specimen Description BLOOD RIGHT HAND  Final   Special Requests   Final    BOTTLES DRAWN AEROBIC AND ANAEROBIC Blood Culture adequate volume   Culture   Final    NO GROWTH 5 DAYS Performed at [facility], [address] [place], [place] [address]    Report Status 03/15/2021 FINAL  Final  MRSA Next Gen by PCR, Nasal     Status: None   Collection Time: 03/10/21 11:02 PM   Specimen: Nasal Mucosa; Nasal Swab  Result Value Ref Range Status   MRSA by PCR Next Gen NOT DETECTED NOT DETECTED Final  Comment: (NOTE) The GeneXpert MRSA Assay (FDA approved for NASAL specimens only), is one component of a comprehensive MRSA colonization surveillance program. It is not intended to diagnose MRSA infection nor to guide or monitor treatment for MRSA infections. Test performance is not FDA approved in patients less than 44 years old. Performed at [facility], [place] [address], [place], [place] [address]   Respiratory (~20 pathogens) panel by PCR     Status: None   Collection Time: 03/11/21  2:58 AM    Specimen: Nasopharyngeal Swab; Respiratory  Result Value Ref Range Status   Adenovirus NOT DETECTED NOT DETECTED Final   Coronavirus 229E NOT DETECTED NOT DETECTED Final    Comment: (NOTE) The Coronavirus on the Respiratory Panel, DOES NOT test for the novel  Coronavirus (2019 nCoV)    Coronavirus HKU1 NOT DETECTED NOT DETECTED Final   Coronavirus NL63 NOT DETECTED NOT DETECTED Final   Coronavirus OC43 NOT DETECTED NOT DETECTED Final   Metapneumovirus NOT DETECTED NOT DETECTED Final   Rhinovirus / Enterovirus NOT DETECTED NOT DETECTED Final   Influenza A NOT DETECTED NOT DETECTED Final   Influenza B NOT DETECTED NOT DETECTED Final   Parainfluenza Virus 1 NOT DETECTED NOT DETECTED Final   Parainfluenza Virus 2 NOT DETECTED NOT DETECTED Final   Parainfluenza Virus 3 NOT DETECTED NOT DETECTED Final   Parainfluenza Virus 4 NOT DETECTED NOT DETECTED Final   Respiratory Syncytial Virus NOT DETECTED NOT DETECTED Final   Bordetella pertussis NOT DETECTED NOT DETECTED Final   Bordetella Parapertussis NOT DETECTED NOT DETECTED Final   Chlamydophila pneumoniae NOT DETECTED NOT DETECTED Final   Mycoplasma pneumoniae NOT DETECTED NOT DETECTED Final    Comment: Performed at [facility], [place]. [address], [place], [place] [address]      Studies: No results found.   Flora Lipps, MD  [facility] 03/16/2021  If 7PM-7AM, please contact night-coverage

## 2021-03-16 NOTE — Progress Notes (Signed)
Booster dose of covid  vaccine given to pt. . Monitored for 15 min.. belongings from safe given by Rn to pt.

## 2021-03-16 NOTE — Discharge Summary (Addendum)
Physician Discharge Summary  Zachary Hawkins [id number] DOB: [birth year] DOA: 03/10/2021  PCP: System, Provider Not In  Admit date: 03/10/2021 Discharge date: 03/16/2021  Admitted From: Home  Discharge disposition: Home.  Recommendations for Outpatient Follow-Up:   Follow up with your primary care provider in one week.  Check CBC, BMP, magnesium in the next visit Follow-up with pulmonary as outpatient in 2 to 3 weeks with Dr Verlee Monte.  Patient has been prescribed supplemental oxygen and noninvasive ventilation at home.  Will need PFTs and follow-up as outpatient.  Discharge Diagnosis:   Active Problems:   Acute respiratory failure with hypoxia (HCC) Atrial fibrillation,  sleep apnea, heart failure with reduced ejection fraction,  Discharge Condition: Improved.  Diet recommendation: Low sodium, heart healthy.    Wound care: None.  Code status: Full.   History of Present Illness:   Patient is a 65 years old male with past medical history of atrial fibrillation on Eliquis, sleep apnea not on CPAP due to cost factor, history of heart failure with reduced ejection, ACE inhibitor induced edema, presented hospital with  increased somnolence.  He stated that his oxygen was malfunctioning at home.  He was brought into the hospital with and was noted to have hypoxia with a pulse ox of 70% on room air and was placed on CPAP and transitioned to BiPAP in the ED.  Patient remained somnolent so was admitted to the ICU.  Initial ABG was pH of 7.2 with a PCO2 of 94.  Hospital Course:   Following conditions were addressed during hospitalization as listed below,  Acute on chronic hypoxic and hypercarbic respiratory failure with pulmonary edema, history of obstructive sleep apnea untreated and suspected COPD with acute exacerbation. Patient was supposed to be on CPAP at home but was not on it due to cost and lack of insurance.  Patient was initially intubated on 03/11/21 and mechanically  ventilated but was subsequently extubated on 03/12/21.  At this time, patient is on supplemental oxygen at 6 L/min.    Will need outpatient pulmonary follow-up with Dr. Verlee Monte, Pulmonary with outpatient PFT.  Continue albuterol nebulizer, prednisone taper. Patient has completed antibiotic course with Rocephin and Zithromax.  Patient will need oxygen on discharge.  Patient was encouraged to use noninvasive ventilation at home especially during napping and at nighttime.  Chronic systolic heart failure. Continue diuretics on discharge.  Patient is on bisoprolol isosorbide and spironolactone as well.  Essential HTN Continue bisoprolol, amlodipine, isosorbide, Lasix and spironolactone on discharge.   Atrial fibrillation on Eliquis On bisoprolol, Eliquis.  Heart rate is stable.   AKI on CKD IIIa Latest creatinine of 1.1.  Closely monitor on diuretic.   GERD  Continue Protonix   Inadequate PO intake On oral diet at this time.   Morbid obesity.   Patient would benefit from weight loss as outpatient.   Disposition.  At this time, patient is stable for disposition home outpatient PCP and pulmonary follow-up..  Medical Consultants:   PCCM  Procedures:    Intubation and mechanical ventilation Noninvasive ventilation  Subjective:   Today, patient was seen and examined at bedside.  Has mild cough but overall shortness of breath has improved.  Discharge Exam:   Vitals:   03/16/21 0829 03/16/21 1200  BP:    Pulse:    Resp:    Temp:    SpO2: 96% 96%   Vitals:   03/16/21 0559 03/16/21 0724 03/16/21 0829 03/16/21 1200  BP:  136/85    Pulse:  77    Resp:  20    Temp:  98 F (36.7 C)    TempSrc:      SpO2:  94% 96% 96%  Weight: (!) 145.8 kg     Height:       General: Morbidly obese built, not in obvious distress, on 6 L oxygen by nasal cannula HENT:   No scleral pallor or icterus noted. Oral mucosa is moist.  Chest:  Diminished breath sounds bilaterally. No crackles or  wheezes.  CVS: S1 &S2 heard. No murmur.  Regular rate and rhythm. Abdomen: Soft, nontender, nondistended.  Bowel sounds are heard.   Extremities: No cyanosis, clubbing trace edema, peripheral pulses are palpable. Psych: Alert, awake and oriented, normal mood CNS:  No cranial nerve deficits.  Power equal in all extremities.   Skin: Warm and dry.  No rashes noted.  The results of significant diagnostics from this hospitalization (including imaging, microbiology, ancillary and laboratory) are listed below for reference.     Diagnostic Studies:   DG CHEST PORT 1 VIEW  Result Date: 03/11/2021 CLINICAL DATA:  65 year old male intubated.  Respiratory distress. EXAM: PORTABLE CHEST 1 VIEW COMPARISON:  03/10/2021 portable chest and earlier. FINDINGS: Portable AP semi upright view at 0635 hours. Endotracheal tube tip in good position at the clavicles. Mildly larger lung volumes. Stable cardiac size and mediastinal contours., cardiomegaly versus mediastinal lipomatosis. Mild streaky bilateral lung base opacity, more resembles atelectasis than infection. No pneumothorax or pulmonary edema. No definite pleural effusion. No acute osseous abnormality identified. Paucity of bowel gas in the upper abdomen. IMPRESSION: 1. Endotracheal tube tip in good position. 2. Mildly improved lung volumes. Streaky lung base opacity favored to be atelectasis. Electronically Signed   By: Genevie Ann M.D.   On: 03/11/2021 06:45   DG Chest Port 1 View  Result Date: 03/10/2021 CLINICAL DATA:  Shortness of breath. EXAM: PORTABLE CHEST 1 VIEW COMPARISON:  Chest radiograph dated 02/12/2021. FINDINGS: There is cardiomegaly with vascular congestion and mild edema. Bibasilar atelectasis. Pneumonia is not excluded. No pleural effusion pneumothorax. No acute osseous pathology. IMPRESSION: Cardiomegaly with findings of CHF. Pneumonia is not excluded. Electronically Signed   By: Anner Crete M.D.   On: 03/10/2021 20:47    Labs:   Basic  Metabolic Panel: Recent Labs  Lab 03/11/21 2019 03/12/21 0510 03/12/21 0557 03/13/21 0347 03/13/21 1530 03/14/21 0231 03/15/21 0018 03/16/21 0216  NA 137 137   < > 136 137 134* 136 135  K 4.6 4.1   < > 4.0 4.8 4.6 4.7 4.7  CL 98 100  --  96*  --  98 101 97*  CO2 28 20*  --  30  --  30 29 33*  GLUCOSE 141* 169*  --  105*  --  92 129* 139*  BUN 23 28*  --  38*  --  34* 25* 19  CREATININE 1.72* 1.59*  --  1.59*  --  1.25* 1.13 1.19  CALCIUM 9.2 9.2  --  8.6*  --  8.3* 8.2* 8.5*  MG 2.5*  --   --  2.5*  --   --  2.4 2.3   < > = values in this interval not displayed.    GFR Estimated Creatinine Clearance: 95.5 mL/min (by C-G formula based on SCr of 1.19 mg/dL). Liver Function Tests: No results for input(s): AST, ALT, ALKPHOS, BILITOT, PROT, ALBUMIN in the last 168 hours. No results for input(s): LIPASE, AMYLASE in the last 168 hours. No results  for input(s): AMMONIA in the last 168 hours. Coagulation profile No results for input(s): INR, PROTIME in the last 168 hours.  CBC: Recent Labs  Lab 03/10/21 2005 03/10/21 2116 03/11/21 0206 03/11/21 0539 03/12/21 0510 03/12/21 0557 03/12/21 0621 03/13/21 0347 03/13/21 1530 03/15/21 0018 03/16/21 0216  WBC 8.5  --  7.1  --  9.1  --   --  14.4*  --  9.2 9.8  NEUTROABS 4.3  --   --   --   --   --   --   --   --   --   --   HGB 14.0   < > 14.0   < > 15.6   < > 16.3 14.3 15.6 13.2 13.4  HCT 46.3   < > 45.9   < > 48.9   < > 48.0 43.9 46.0 43.5 44.5  MCV 96.1  --  95.4  --  90.2  --   --  90.5  --  94.4 96.3  PLT 208  --  198  --  243  --   --  250  --  219 214   < > = values in this interval not displayed.    Cardiac Enzymes: No results for input(s): CKTOTAL, CKMB, CKMBINDEX, TROPONINI in the last 168 hours. BNP: Invalid input(s): POCBNP CBG: Recent Labs  Lab 03/12/21 0333 03/12/21 0824 03/12/21 1106 03/12/21 1646 03/12/21 2117  GLUCAP 155* 149* 163* 149* 154*    D-Dimer No results for input(s): DDIMER in the last  72 hours. Hgb A1c No results for input(s): HGBA1C in the last 72 hours. Lipid Profile No results for input(s): CHOL, HDL, LDLCALC, TRIG, CHOLHDL, LDLDIRECT in the last 72 hours. Thyroid function studies No results for input(s): TSH, T4TOTAL, T3FREE, THYROIDAB in the last 72 hours.  Invalid input(s): FREET3 Anemia work up No results for input(s): VITAMINB12, FOLATE, FERRITIN, TIBC, IRON, RETICCTPCT in the last 72 hours. Microbiology Recent Results (from the past 240 hour(s))  Resp Panel by RT-PCR (Flu A&B, Covid) Nasopharyngeal Swab     Status: None   Collection Time: 03/10/21  8:06 PM   Specimen: Nasopharyngeal Swab; Nasopharyngeal(NP) swabs in vial transport medium  Result Value Ref Range Status   SARS Coronavirus 2 by RT PCR NEGATIVE NEGATIVE Final    Comment: (NOTE) SARS-CoV-2 target nucleic acids are NOT DETECTED.  The SARS-CoV-2 RNA is generally detectable in upper respiratory specimens during the acute phase of infection. The lowest concentration of SARS-CoV-2 viral copies this assay can detect is 138 copies/mL. A negative result does not preclude SARS-Cov-2 infection and should not be used as the sole basis for treatment or other patient management decisions. A negative result may occur with  improper specimen collection/handling, submission of specimen other than nasopharyngeal swab, presence of viral mutation(s) within the areas targeted by this assay, and inadequate number of viral copies(<138 copies/mL). A negative result must be combined with clinical observations, patient history, and epidemiological information. The expected result is Negative.  Fact Sheet for Patients:  EntrepreneurPulse.com.au  Fact Sheet for Healthcare Providers:  IncredibleEmployment.be  This test is no t yet approved or cleared by the [place] FDA and  has been authorized for detection and/or diagnosis of SARS-CoV-2 by FDA under an Emergency Use  Authorization (EUA). This EUA will remain  in effect (meaning this test can be used) for the duration of the COVID-19 declaration under Section 564(b)(1) of the Act, 21 U.S.C.section 360bbb-3(b)(1), unless the authorization is terminated  or revoked  sooner.       Influenza A by PCR NEGATIVE NEGATIVE Final   Influenza B by PCR NEGATIVE NEGATIVE Final    Comment: (NOTE) The Xpert Xpress SARS-CoV-2/FLU/RSV plus assay is intended as an aid in the diagnosis of influenza from Nasopharyngeal swab specimens and should not be used as a sole basis for treatment. Nasal washings and aspirates are unacceptable for Xpert Xpress SARS-CoV-2/FLU/RSV testing.  Fact Sheet for Patients: EntrepreneurPulse.com.au  Fact Sheet for Healthcare Providers: IncredibleEmployment.be  This test is not yet approved or cleared by the [place] FDA and has been authorized for detection and/or diagnosis of SARS-CoV-2 by FDA under an Emergency Use Authorization (EUA). This EUA will remain in effect (meaning this test can be used) for the duration of the COVID-19 declaration under Section 564(b)(1) of the Act, 21 U.S.C. section 360bbb-3(b)(1), unless the authorization is terminated or revoked.  Performed at [facility], [place] [address], [place], [place] [address]   Culture, blood (routine x 2)     Status: Abnormal   Collection Time: 03/10/21  8:25 PM   Specimen: BLOOD  Result Value Ref Range Status   Specimen Description BLOOD LEFT ANTECUBITAL  Final   Special Requests   Final    BOTTLES DRAWN AEROBIC AND ANAEROBIC Blood Culture adequate volume   Culture  Setup Time   Final    GRAM POSITIVE COCCI IN CLUSTERS CRITICAL RESULT CALLED TO, READ BACK BY AND VERIFIED WITH: PHARMD EDEN BREWINGTON 03/11/2021 @1932  BY JW IN BOTH AEROBIC AND ANAEROBIC BOTTLES    Culture (A)  Final    STAPHYLOCOCCUS EPIDERMIDIS THE SIGNIFICANCE OF ISOLATING THIS ORGANISM FROM A SINGLE SET OF  BLOOD CULTURES WHEN MULTIPLE SETS ARE DRAWN IS UNCERTAIN. PLEASE NOTIFY THE MICROBIOLOGY DEPARTMENT WITHIN ONE WEEK IF SPECIATION AND SENSITIVITIES ARE REQUIRED. Performed at [facility], [place] [address] [place], [place] [address]    Report Status 03/13/2021 FINAL  Final  Blood Culture ID Panel (Reflexed)     Status: Abnormal   Collection Time: 03/10/21  8:25 PM  Result Value Ref Range Status   Enterococcus faecalis NOT DETECTED NOT DETECTED Final   Enterococcus Faecium NOT DETECTED NOT DETECTED Final   Listeria monocytogenes NOT DETECTED NOT DETECTED Final   Staphylococcus species DETECTED (A) NOT DETECTED Final    Comment: CRITICAL RESULT CALLED TO, READ BACK BY AND VERIFIED WITH: PHARMD EDEN BREWINGTON 03/11/2021 @1932  BY JW    Staphylococcus aureus (BCID) NOT DETECTED NOT DETECTED Final   Staphylococcus epidermidis DETECTED (A) NOT DETECTED Final    Comment: CRITICAL RESULT CALLED TO, READ BACK BY AND VERIFIED WITH: PHARMD EDEN BREWINGTON 03/11/2021 @1932  BY JW    Staphylococcus lugdunensis NOT DETECTED NOT DETECTED Final   Streptococcus species NOT DETECTED NOT DETECTED Final   Streptococcus agalactiae NOT DETECTED NOT DETECTED Final   Streptococcus pneumoniae NOT DETECTED NOT DETECTED Final   Streptococcus pyogenes NOT DETECTED NOT DETECTED Final   A.calcoaceticus-baumannii NOT DETECTED NOT DETECTED Final   Bacteroides fragilis NOT DETECTED NOT DETECTED Final   Enterobacterales NOT DETECTED NOT DETECTED Final   Enterobacter cloacae complex NOT DETECTED NOT DETECTED Final   Escherichia coli NOT DETECTED NOT DETECTED Final   Klebsiella aerogenes NOT DETECTED NOT DETECTED Final   Klebsiella oxytoca NOT DETECTED NOT DETECTED Final   Klebsiella pneumoniae NOT DETECTED NOT DETECTED Final   Proteus species NOT DETECTED NOT DETECTED Final   Salmonella species NOT DETECTED NOT DETECTED Final   Serratia marcescens NOT DETECTED NOT DETECTED  Final   Haemophilus influenzae NOT  DETECTED NOT DETECTED Final   Neisseria meningitidis NOT DETECTED NOT DETECTED Final   Pseudomonas aeruginosa NOT DETECTED NOT DETECTED Final   Stenotrophomonas maltophilia NOT DETECTED NOT DETECTED Final   Candida albicans NOT DETECTED NOT DETECTED Final   Candida auris NOT DETECTED NOT DETECTED Final   Candida glabrata NOT DETECTED NOT DETECTED Final   Candida krusei NOT DETECTED NOT DETECTED Final   Candida parapsilosis NOT DETECTED NOT DETECTED Final   Candida tropicalis NOT DETECTED NOT DETECTED Final   Cryptococcus neoformans/gattii NOT DETECTED NOT DETECTED Final   Methicillin resistance mecA/C NOT DETECTED NOT DETECTED Final    Comment: Performed at [facility], [address], [place], [place] [address]  Culture, blood (routine x 2)     Status: None   Collection Time: 03/10/21  8:40 PM   Specimen: BLOOD RIGHT HAND  Result Value Ref Range Status   Specimen Description BLOOD RIGHT HAND  Final   Special Requests   Final    BOTTLES DRAWN AEROBIC AND ANAEROBIC Blood Culture adequate volume   Culture   Final    NO GROWTH 5 DAYS Performed at [facility], [address] [place], [place] [address]    Report Status 03/15/2021 FINAL  Final  MRSA Next Gen by PCR, Nasal     Status: None   Collection Time: 03/10/21 11:02 PM   Specimen: Nasal Mucosa; Nasal Swab  Result Value Ref Range Status   MRSA by PCR Next Gen NOT DETECTED NOT DETECTED Final    Comment: (NOTE) The GeneXpert MRSA Assay (FDA approved for NASAL specimens only), is one component of a comprehensive MRSA colonization surveillance program. It is not intended to diagnose MRSA infection nor to guide or monitor treatment for MRSA infections. Test performance is not FDA approved in patients less than 66 years old. Performed at [facility], [address] [place], [place] [address]   Respiratory (~20 pathogens) panel by PCR     Status: None   Collection Time: 03/11/21  2:58 AM   Specimen:  Nasopharyngeal Swab; Respiratory  Result Value Ref Range Status   Adenovirus NOT DETECTED NOT DETECTED Final   Coronavirus 229E NOT DETECTED NOT DETECTED Final    Comment: (NOTE) The Coronavirus on the Respiratory Panel, DOES NOT test for the novel  Coronavirus (2019 nCoV)    Coronavirus HKU1 NOT DETECTED NOT DETECTED Final   Coronavirus NL63 NOT DETECTED NOT DETECTED Final   Coronavirus OC43 NOT DETECTED NOT DETECTED Final   Metapneumovirus NOT DETECTED NOT DETECTED Final   Rhinovirus / Enterovirus NOT DETECTED NOT DETECTED Final   Influenza A NOT DETECTED NOT DETECTED Final   Influenza B NOT DETECTED NOT DETECTED Final   Parainfluenza Virus 1 NOT DETECTED NOT DETECTED Final   Parainfluenza Virus 2 NOT DETECTED NOT DETECTED Final   Parainfluenza Virus 3 NOT DETECTED NOT DETECTED Final   Parainfluenza Virus 4 NOT DETECTED NOT DETECTED Final   Respiratory Syncytial Virus NOT DETECTED NOT DETECTED Final   Bordetella pertussis NOT DETECTED NOT DETECTED Final   Bordetella Parapertussis NOT DETECTED NOT DETECTED Final   Chlamydophila pneumoniae NOT DETECTED NOT DETECTED Final   Mycoplasma pneumoniae NOT DETECTED NOT DETECTED Final    Comment: Performed at [facility], [address], [place], [place] [address]     Discharge Instructions:   Discharge Instructions     Ambulatory referral to Pulmonology   Complete by: As directed  BiPAP at nighttime, home oxygen.   Reason for referral: Asthma/COPD   Diet - low sodium heart healthy   Complete by: As directed    Discharge instructions   Complete by: As directed    Follow-up with primary care provider in 1 week.  Follow-up with pulmonary physician in 2-3 weeks with Dr Verlee Monte).  Continue BiPAP and oxygen at home.  No overexertion.   Increase activity slowly   Complete by: As directed       Allergies as of 03/16/2021       Reactions   Ace Inhibitors Anaphylaxis   Black Cherry Fruit Extract Marcelline Mates Extract] Anaphylaxis    Tongue   Grenadine Flavor [flavoring Agent] Anaphylaxis   Chlorhexidine         Medication List     STOP taking these medications    atorvastatin 10 MG tablet Commonly known as: LIPITOR   carvedilol 3.125 MG tablet Commonly known as: COREG   fluticasone 50 MCG/ACT nasal spray Commonly known as: FLONASE   loratadine 10 MG tablet Commonly known as: CLARITIN   sodium chloride 0.65 % Soln nasal spray Commonly known as: OCEAN       TAKE these medications    Advair HFA 115-21 MCG/ACT inhaler Generic drug: fluticasone-salmeterol INHALE 2 PUFFS INTO THE LUNGS 2 (TWO) TIMES DAILY.   albuterol 108 (90 Base) MCG/ACT inhaler Commonly known as: VENTOLIN HFA Inhale 2 puffs into the lungs every 4 (four) hours as needed for wheezing or shortness of breath.   amLODipine 10 MG tablet Commonly known as: NORVASC Take 10 mg by mouth daily.   apixaban 5 MG Tabs tablet Commonly known as: ELIQUIS Take 1 tablet (5 mg total) by mouth 2 (two) times daily.   bisoprolol 5 MG tablet Commonly known as: ZEBETA Take 1 tablet (5 mg total) by mouth daily.   EPINEPHrine 0.3 mg/0.3 mL Soaj injection Commonly known as: EPI-PEN Inject 0.3 mg into the muscle as needed for anaphylaxis.   famotidine 20 MG tablet Commonly known as: PEPCID Take 1 tablet (20 mg total) by mouth 2 (two) times daily.   furosemide 80 MG tablet Commonly known as: LASIX Take 1 tablet (80 mg total) by mouth daily.   hydrALAZINE 50 MG tablet Commonly known as: APRESOLINE Take 0.5 tablets (25 mg total) by mouth 3 (three) times daily. What changed: how much to take   isosorbide dinitrate 20 MG tablet Commonly known as: ISORDIL TAKE 1 TABLET (20 MG TOTAL) BY MOUTH 3 (THREE) TIMES DAILY. SCHEDULE OFFICE VISIT What changed:  how much to take how to take this when to take this   multivitamin with minerals Tabs tablet Take 1 tablet by mouth daily.   polyethylene glycol 17 g packet Commonly known as: MIRALAX /  GLYCOLAX Take 17 g by mouth daily as needed for moderate constipation.   predniSONE 20 MG tablet Commonly known as: DELTASONE Take 1 tablets (20 mg total) by mouth daily with breakfast for 2 days. What changed: additional instructions   spironolactone 25 MG tablet Commonly known as: ALDACTONE Take 1 tablet (25 mg total) by mouth daily.   tamsulosin 0.4 MG Caps capsule Commonly known as: FLOMAX Take 1 capsule (0.4 mg total) by mouth daily after supper.               Durable Medical Equipment  (From admission, onward)           Start     Ordered   03/16/21 1117  For home use  only DME oxygen  Once       Question Answer Comment  Length of Need Lifetime   Mode or (Route) Nasal cannula   Liters per Minute 6   Frequency Continuous (stationary and portable oxygen unit needed)   Oxygen conserving device Yes   Oxygen delivery system Gas      03/16/21 1117   03/13/21 1608  For home use only DME Bipap  Once       Question Answer Comment  Length of Need Lifetime   Keep 02 saturation >90      03/13/21 1608            Follow-up Information     Croitoru, Mihai, MD. Schedule an appointment as soon as possible for a visit in 1 week(s).   Specialty: Cardiology Contact information: [address] [place] [address] [phone]         Maryjane Hurter, MD Follow up in 2 week(s).   Specialty: Pulmonary Disease Why: follow up of NIV, for PFT Contact information: [place] [address] [phone]                 Time coordinating discharge: 39 minutes  Signed:  Dajon Lazar  [facility] 03/16/2021, 2:18 PM

## 2021-03-16 NOTE — Progress Notes (Signed)
Mobility Specialist: Progress Note   03/16/21 1304  Mobility  Activity Ambulated in hall  Level of Assistance Modified independent, requires aide device or extra time  Assistive Device Front wheel walker  Distance Ambulated (ft) 440 ft (340'+100')  Mobility Ambulated with assistance in hallway  Mobility Response Tolerated well  Mobility performed by Mobility specialist  $Mobility charge 1 Mobility   During Mobility on RA: 85-87% SpO2 Post-Mobility on 2 L/min: 77 HR, 92% SpO2  Pt c/o feeling SOB during ambulation on RA as well as some lower back pain. Pt sitting EOB after walk with call bell at his side.  [facility] Daley Gosse Mobility Specialist Mobility Specialist Phone: [phone]

## 2021-03-16 NOTE — TOC Transition Note (Signed)
Transition of Care [facility]) - CM/SW Discharge Note   Patient Details  Name: Zachary Hawkins MRN: [id number] Date of Birth: 16-Feb-1956  Transition of Care [facility]) CM/SW Contact:  Leone Haven, RN Phone Number: 03/16/2021, 12:56 PM   Clinical Narrative:    Patient is for dc today, he would like the NIV to be set up at his home at address [address] [place], [place] [address],  [phone].  NCM informed Jermaine with [employer].  Patient states his son will transport him home at dc.  [employer] will bring oxygen tank to patient room for him to go home with today and the NIV will be set up at patient 's home.    Final next level of care: Home/Self Care Barriers to Discharge: No Barriers Identified   Patient Goals and CMS Choice Patient states their goals for this hospitalization and ongoing recovery are:: return home   Choice offered to / list presented to : NA  Discharge Placement                       Discharge Plan and Services In-house Referral: Clinical Social Work Discharge Planning Services: CM Consult Post Acute Care Choice: Durable Medical Equipment          DME Arranged: NIV, Oxygen DME Agency: [employer] Date DME Agency Contacted: 03/13/21 Time DME Agency Contacted: 1255 Representative spoke with at DME Agency: Vaughan Basta HH Arranged: NA          Social Determinants of Health (SDOH) Interventions     Readmission Risk Interventions Readmission Risk Prevention Plan 03/13/2021  Transportation Screening Complete  Medication Review [employer]) Complete  PCP or Specialist appointment within 3-5 days of discharge Complete  HRI or Home Care Consult Complete  SW Recovery Care/Counseling Consult Complete  Palliative Care Screening Not Applicable  Skilled Nursing Facility Complete  Some recent data might be hidden

## 2021-03-16 NOTE — Progress Notes (Signed)
SATURATION QUALIFICATIONS: (This note is used to comply with regulatory documentation for home oxygen)  Patient Saturations on Room Air at Rest = 88%  Patient Saturations on Room Air while Ambulating = 85%  Patient Saturations on 2 Liters of oxygen while Ambulating = 92%

## 2021-03-16 NOTE — Progress Notes (Signed)
Discharged instructions given to pt. Oxygen for home use  with pt.

## 2021-03-19 ENCOUNTER — Other Ambulatory Visit: Payer: Self-pay

## 2021-03-19 LAB — POCT I-STAT EG7
Acid-base deficit: 2 mmol/L (ref 0.0–2.0)
Bicarbonate: 19.7 mmol/L — ABNORMAL LOW (ref 20.0–28.0)
Calcium, Ion: 0.38 mmol/L — CL (ref 1.15–1.40)
HCT: 34 % — ABNORMAL LOW (ref 39.0–52.0)
Hemoglobin: 11.6 g/dL — ABNORMAL LOW (ref 13.0–17.0)
O2 Saturation: 90 %
Patient temperature: 98.9
Potassium: >8.5 mmol/L (ref 3.5–5.1)
Sodium: 127 mmol/L — ABNORMAL LOW (ref 135–145)
TCO2: 20 mmol/L — ABNORMAL LOW (ref 22–32)
pCO2, Ven: 24.7 mmHg — ABNORMAL LOW (ref 44.0–60.0)
pH, Ven: 7.511 — ABNORMAL HIGH (ref 7.250–7.430)
pO2, Ven: 52 mmHg — ABNORMAL HIGH (ref 32.0–45.0)

## 2021-03-30 ENCOUNTER — Other Ambulatory Visit: Payer: Self-pay

## 2021-03-30 ENCOUNTER — Encounter: Payer: Self-pay | Admitting: Physician Assistant

## 2021-03-30 ENCOUNTER — Ambulatory Visit: Payer: Medicaid Other | Admitting: Nurse Practitioner

## 2021-03-30 VITALS — BP 148/90 | HR 101 | Ht 73.0 in | Wt 338.2 lb

## 2021-03-30 DIAGNOSIS — R079 Chest pain, unspecified: Secondary | ICD-10-CM

## 2021-03-30 DIAGNOSIS — J9612 Chronic respiratory failure with hypercapnia: Secondary | ICD-10-CM

## 2021-03-30 DIAGNOSIS — I5032 Chronic diastolic (congestive) heart failure: Secondary | ICD-10-CM | POA: Diagnosis not present

## 2021-03-30 DIAGNOSIS — J9611 Chronic respiratory failure with hypoxia: Secondary | ICD-10-CM

## 2021-03-30 DIAGNOSIS — I480 Paroxysmal atrial fibrillation: Secondary | ICD-10-CM | POA: Diagnosis not present

## 2021-03-30 DIAGNOSIS — I10 Essential (primary) hypertension: Secondary | ICD-10-CM

## 2021-03-30 DIAGNOSIS — G4733 Obstructive sleep apnea (adult) (pediatric): Secondary | ICD-10-CM

## 2021-03-30 DIAGNOSIS — E785 Hyperlipidemia, unspecified: Secondary | ICD-10-CM

## 2021-03-30 MED ORDER — FUROSEMIDE 80 MG PO TABS: ORAL_TABLET | ORAL | 3 refills | Status: DC

## 2021-03-30 MED ORDER — SPIRONOLACTONE 25 MG PO TABS: 12.5000 mg | ORAL_TABLET | Freq: Every day | ORAL | 1 refills | Status: DC

## 2021-03-30 NOTE — Progress Notes (Signed)
Cardiology Clinic Note   Patient Name: Zachary Hawkins Date of Encounter: 03/30/2021  Primary Care Provider:  System, Provider Not In Primary Cardiologist:  Sanda Klein, MD  Patient Profile    65 year old male with a history of HFimpEF (echo 01/2021 showed EF 60-65%, GIDD), nonischemic cardiomyopathy, hypertension, paroxysmal atrial fibrillation on Eliquis, tobacco abuse, obstructive sleep apnea on CPAP, chronic respiratory failure on home oxygen therapy, CKD stage III, and morbid obesity who presents for follow-up post hospital discharge.   Past Medical History    Past Medical History:  Diagnosis Date   Cardiomyopathy ([facility]) 06/2017   EF 40-45%   Chronic renal insufficiency, stage 3 (moderate) (HCC)    Diastolic dysfunction [id number]   grade 2    Hypertension    Hypertensive urgency    Obesity    No past surgical history on file.  Allergies  Allergies  Allergen Reactions   Ace Inhibitors Anaphylaxis   Black Cherry Fruit Extract Marcelline Mates Extract] Anaphylaxis    Tongue   Grenadine Flavor [Flavoring Agent] Anaphylaxis   Chlorhexidine     History of Present Illness    65 year old male with the above past medical history including HFimpEF (echo 01/2021 showed EF 60-65%, improved from 40-45% in 2019, GIDD), nonischemic cardiomyopathy, hypertension, recurrent angioedema (which occurred both on and off ACE-I/ARB therapy), paroxysmal atrial fibrillation on Eliquis, tobacco abuse, obstructive sleep apnea on CPAP, chronic respiratory failure on home oxygen therapy, CKD stage III, and morbid obesity.   He established care with our practice in January 2019 following hospital cardiology consult in the setting of bradycardia, accelerated hypertension, renal insufficiency, and acute combined CHF.  Echocardiogram at the time showed an EF was 40 to 45% with grade 2 diastolic dysfunction.  He has a history of paroxysmal atrial fibrillation and is on Eliquis 5 mg twice daily.    He saw Dr.  Sallyanne Kuster in October 2022 at which time he had a 22 pound weight gain. His Lasix was increased to 20 mg daily and spironolactone 25 mg daily was added in setting of volume overload.  Additionally, his carvedilol was changed to bisoprolol secondary to reactive airway disease. At his most recent clinic visit on 03/09/2021, he was doing poorly, with episodes of somnolence at the time of his visit, he was not taking his medications at the time.  Possibility of hospitalization was discussed with Dr. Sallyanne Kuster however the patient did not feel he needed to go to the hospital at the time.  His blood pressure medication was reduced, including discontinuation of his amlodipine, hydralazine was reduced to 25 mg 3 times a day. and spironolactone was reduced to 25 mg daily.  He presented to ED the next day with acute hypoxic hypercarbic respiroatory failure with pulm edema in the setting of acute exac of COPD/CHF. He was hospitalized from 03/10/2021 -03/16/2021. He was admitted to ICU, intubated, and extubated on 03/12/2021. Chest x-ray showed cardiomegaly, chf, and possible pneumonia. He completed a course of antibiotics, was diuresed with Lasix, and discharged with home oxygen.  Follow-up with pulmonology was recommended for repeat PFTs.   He presents today for follow-up post hospital discharge. He was seen by his primary care with [facility] post-discharge, notes from visit not available at this time. He has been on home oxygen 4-6 L continuously, and has been using CPAP for 2 to 3 hours at night, he reports difficulty adjusting to the machine.  He has not yet followed up with pulmonology. He has stopped drinking alcohol  and smoking.   He reports overall low energy, some lower extremity edema, and abdominal tightness with some dyspnea on exertion.  Weight is stable at 338 pounds today, he is down 4 pounds since his last office visit. He is sleeping on three pillows at night, unchanged from previous.  Additionally, he  reports 2 episodes of brief chest tightness with activity, relieved with rest. EKG shows sinus tachycardia, rate 105, non-specific T wave changes and V1, V2, noted on prior EKG. He does appear volume up on exam today he states he is not sure he has been taking his spironolactone 25 mg daily. He has been liberal with his fluid and sodium intake.  He is taking his Lasix 80 mg daily however, he reports not taking his spironolactone 25 mg daily. He does not monitor his blood pressure or weight at home.  BP slightly elevated in office today at 148/90.  Per TOC note at hospital discharge he was going to be living with his sister as his lites are, disconnected, however his sister is deceased.  He is currently living in her house with a friend. He denies palpitations, pnd, n, v, dizziness, syncope, weight gain, or early satiety.  Home Medications    Current Outpatient Medications  Medication Sig Dispense Refill   ADVAIR HFA 115-21 MCG/ACT inhaler INHALE 2 PUFFS INTO THE LUNGS 2 (TWO) TIMES DAILY. 36 g 12   albuterol (VENTOLIN HFA) 108 (90 Base) MCG/ACT inhaler Inhale 2 puffs into the lungs every 4 (four) hours as needed for wheezing or shortness of breath. 18 g 0   amLODipine (NORVASC) 10 MG tablet Take 10 mg by mouth daily.     apixaban (ELIQUIS) 5 MG TABS tablet Take 1 tablet (5 mg total) by mouth 2 (two) times daily. 60 tablet 2   bisoprolol (ZEBETA) 5 MG tablet Take 1 tablet (5 mg total) by mouth daily. 90 tablet 3   EPINEPHrine 0.3 mg/0.3 mL IJ SOAJ injection Inject 0.3 mg into the muscle as needed for anaphylaxis. 1 each 1   hydrALAZINE (APRESOLINE) 50 MG tablet Take 0.5 tablets (25 mg total) by mouth 3 (three) times daily. (Patient taking differently: Take 50 mg by mouth 3 (three) times daily.) 90 tablet 3   isosorbide dinitrate (ISORDIL) 20 MG tablet TAKE 1 TABLET (20 MG TOTAL) BY MOUTH 3 (THREE) TIMES DAILY. SCHEDULE OFFICE VISIT (Patient taking differently: Take 20 mg by mouth 3 (three) times daily.)  270 tablet 1   famotidine (PEPCID) 20 MG tablet Take 1 tablet (20 mg total) by mouth 2 (two) times daily. (Patient not taking: Reported on 03/30/2021) 28 tablet 0   furosemide (LASIX) 80 MG tablet Take 1 tablet (80 mg total) by mouth in the morning AND 0.5 tablets (40 mg total) every evening. 135 tablet 3   Multiple Vitamin (MULTIVITAMIN WITH MINERALS) TABS tablet Take 1 tablet by mouth daily. (Patient not taking: Reported on 03/11/2021)     polyethylene glycol (MIRALAX / GLYCOLAX) 17 g packet Take 17 g by mouth daily as needed for moderate constipation. (Patient not taking: Reported on 03/11/2021) 14 each 0   spironolactone (ALDACTONE) 25 MG tablet Take 0.5 tablets (12.5 mg total) by mouth daily. 45 tablet 1   tamsulosin (FLOMAX) 0.4 MG CAPS capsule Take 1 capsule (0.4 mg total) by mouth daily after supper. (Patient not taking: Reported on 03/30/2021) 30 capsule 2   No current facility-administered medications for this visit.     Family History    Family History  Problem Relation Age of Onset   Diabetes Mother    Diabetes Father    He indicated that the status of his mother is unknown. He indicated that the status of his father is unknown.  Social History    Social History   Socioeconomic History   Marital status: Single    Spouse name: Not on file   Number of children: Not on file   Years of education: Not on file   Highest education level: Not on file  Occupational History   Not on file  Tobacco Use   Smoking status: Every Day    Packs/day: 1.00    Types: Cigarettes   Smokeless tobacco: Never   Tobacco comments:    1 pack last 2-3 days- states stopped 01/04/21.  Substance and Sexual Activity   Alcohol use: Yes    Alcohol/week: 1.0 standard drink    Types: 1 Shots of liquor per week    Comment: socially   Drug use: Yes    Types: Marijuana    Comment: every other week   Sexual activity: Not on file  Other Topics Concern   Not on file  Social History Narrative   Not on  file   Social Determinants of Health   Financial Resource Strain: Not on file  Food Insecurity: Not on file  Transportation Needs: Not on file  Physical Activity: Not on file  Stress: Not on file  Social Connections: Not on file  Intimate Partner Violence: Not on file     Review of Systems    General:  No chills, fever, night sweats or weight changes.  Cardiovascular:  See HPI, chest tightness on exertion, dyspnea on exertion, lower extremity edema, 3 pillow orthopnea. He denies palpitations, paroxysmal nocturnal dyspnea. Dermatological: No rash, lesions/masses Respiratory: No cough, positive dyspnea Urologic: No hematuria, dysuria Abdominal: No nausea, vomiting, diarrhea, bright red blood per rectum, melena, or hematemesis Neurologic: No visual changes, wkns, changes in mental status. All other systems reviewed and are otherwise negative except as noted above.     Physical Exam    VS:  BP (!) 148/90   Pulse (!) 101   Ht 6\' 1"  (1.854 m)   Wt (!) 338 lb 3.2 oz (153.4 kg)   SpO2 97%   BMI 44.62 kg/m   GEN: Obese, well developed, in no acute distress. HEENT: normal. Neck: Supple, no JVD, carotid bruits, or masses. Cardiac: RRR, 2/6 murmurs, rubs, or gallops. No clubbing, cyanosis, edema.  Radials/DP/PT 2+ and equal bilaterally.  Respiratory:  Respirations regular and unlabored, clear to auscultation bilaterally. GI: Obese, distended, nontender, BS + x 4. MS: no deformity or atrophy. Skin: warm and dry, no rash. Neuro:  Strength and sensation are intact. Psych: Normal affect.  Accessory Clinical Findings    ECG personally reviewed by me today- Sinus tachycardia, 101 bpm- No acute changes  Lab Results  Component Value Date   WBC 9.8 03/16/2021   HGB 13.4 03/16/2021   HCT 44.5 03/16/2021   MCV 96.3 03/16/2021   PLT 214 03/16/2021   Lab Results  Component Value Date   CREATININE 1.19 03/16/2021   BUN 19 03/16/2021   NA 135 03/16/2021   K 4.7 03/16/2021   CL 97  (L) 03/16/2021   CO2 33 (H) 03/16/2021   Lab Results  Component Value Date   ALT 25 10/17/2020   AST 22 10/17/2020   ALKPHOS 70 10/17/2020   BILITOT 0.6 10/17/2020   Lab Results  Component Value Date  CHOL 189 07/11/2020   HDL 49 07/11/2020   LDLCALC 109 (H) 07/11/2020   TRIG 83 03/12/2021   CHOLHDL 3.9 07/11/2020    No results found for: HGBA1C  Assessment & Plan   1.  Acute on chronic systolic heart failure with improved EF: Echo 01/2021 showed EF 60-65%, improved from 40-45% in 2019, GIDD. Recently hospitalized with acute respiratory failure no on home oxygen therapy.  He was discharged on Lasix 80 mg daily and spironolactone 25 mg daily.  However, he has not been taking his spironolactone.  His weight is stable, however he appears volume up on exam today. Will increase Lasix to 80 mg daily in the morning with an additional 40 mg daily in the evening.  We will also restart spironolactone at 12.5 mg daily with plan to follow-up in 2 weeks. Discussed sodium restriction, fluid restriction, and with the "salty six."  Continue amlodipine 10 mg daily, bisoprolol 5 mg daily, hydralazine 25 mg 3 times daily, and Isordil 20 mg 3 times daily.  Repeat BMET at follow-up visit.   2. Paroxysmal atrial fibrillation: In sinus rhythm today, though he is slightly tachycardic with a rate of 101 bpm.  Tachycardia possibly in the setting of increase in fluid volume status.  Will provide additional diuresis as above.  Continue Eliquis 5 mg twice daily, bisoprolol 5 mg daily.   3. Chronic respiratory failure: Discharged from the hospital on 03/16/2021 with acute hypoxic hypercarbic respiroatory failure with pulmonary edema in the setting of acute exac of COPD/CHF. He was discharged on 2 to 4 L of oxygen for continuous use. Also with history of OSA now on CPAP.  He appears volume up on exam today. Will provide additional diuresis as above.  He will follow-up with pulmonology for PFTs.  Continue home oxygen  therapy.  4. Chest pain, nonspecific: He reports 2 episodes of brief chest tightness with activity, relieved with rest. EKG shows sinus tachycardia, rate 105, non-specific T wave changes and V1, V2, noted on prior EKG. symptoms are somewhat difficult to assess in the setting of volume overload.  We will follow-up in 2 weeks and discuss need for additional ischemic evaluation at that time. Given history of reactive airway disease recommend avoiding Myoview, consider cardiac CT vs cath.  Continue medications as above.  5. Obstructive sleep apnea: He is using his CPAP 2 to 3 hours a night and is finding it difficult to tolerate the mask.  Encouraged continued adherence to CPAP machine.  He will follow-up with pulmonology.   6. Hypertension: Borderline in office today, 148/90.  He is not checking his blood pressure at home.  Possibly elevated in the setting of volume overload.  Continue amlodipine 10 mg daily, bisoprolol 5 mg daily, hydralazine 25 mg 3 times daily, Isordil 20 mg 3 times daily,  7. Hyperlipidemia: Last lipid panel was in 07/2020.  LDL 109, HDL 49, total cholesterol 283.  He has not currently taking any medication for this.  We will repeat lipid panel at next visit.  8. Disposition: Increase Lasix as above.  Restart spironolactone 12.5 mg daily.  Follow-up in 2 weeks.  Follow-up labs in next visit.  Joylene Grapes, NP 03/30/2021, 5:41 PM

## 2021-03-30 NOTE — Patient Instructions (Signed)
Medication Instructions:   -Restart spironolactone (aldactone) 12.5mg  once daily.  -Increase furosemide (lasix) to 80mg  in the morning and 40mg  (1/2 tablet) in the evening.  *If you need a refill on your cardiac medications before your next appointment, please call your pharmacy*   Follow-Up: At [facility], you and your health needs are our priority.  As part of our continuing mission to provide you with exceptional heart care, we have created designated Provider Care Teams.  These Care Teams include your primary Cardiologist (physician) and Advanced Practice Providers (APPs -  Physician Assistants and Nurse Practitioners) who all work together to provide you with the care you need, when you need it.  We recommend signing up for the patient portal called "MyChart".  Sign up information is provided on this After Visit Summary.  MyChart is used to connect with patients for Virtual Visits (Telemedicine).  Patients are able to view lab/test results, encounter notes, upcoming appointments, etc.  Non-urgent messages can be sent to your provider as well.   To learn more about what you can do with MyChart, go to .    Your next appointment:   Tuesday, November 29 @ 11:40am  Provider:   [email or web address], MD

## 2021-04-07 ENCOUNTER — Other Ambulatory Visit: Payer: Self-pay

## 2021-04-07 ENCOUNTER — Encounter: Payer: Self-pay | Admitting: Cardiovascular Disease

## 2021-04-07 ENCOUNTER — Ambulatory Visit: Payer: Medicaid Other | Admitting: Cardiovascular Disease

## 2021-04-07 VITALS — BP 142/82 | HR 106 | Ht 73.0 in | Wt 341.6 lb

## 2021-04-07 DIAGNOSIS — G4733 Obstructive sleep apnea (adult) (pediatric): Secondary | ICD-10-CM | POA: Diagnosis not present

## 2021-04-07 DIAGNOSIS — I5033 Acute on chronic diastolic (congestive) heart failure: Secondary | ICD-10-CM | POA: Diagnosis not present

## 2021-04-07 DIAGNOSIS — J9622 Acute and chronic respiratory failure with hypercapnia: Secondary | ICD-10-CM

## 2021-04-07 DIAGNOSIS — J449 Chronic obstructive pulmonary disease, unspecified: Secondary | ICD-10-CM

## 2021-04-07 DIAGNOSIS — I480 Paroxysmal atrial fibrillation: Secondary | ICD-10-CM | POA: Diagnosis not present

## 2021-04-07 DIAGNOSIS — J9621 Acute and chronic respiratory failure with hypoxia: Secondary | ICD-10-CM

## 2021-04-07 DIAGNOSIS — I5032 Chronic diastolic (congestive) heart failure: Secondary | ICD-10-CM

## 2021-04-07 MED ORDER — FUROSEMIDE 80 MG PO TABS: 80.0000 mg | ORAL_TABLET | Freq: Two times a day (BID) | ORAL | 3 refills | Status: DC

## 2021-04-07 MED ORDER — SPIRONOLACTONE 25 MG PO TABS: 25.0000 mg | ORAL_TABLET | Freq: Every day | ORAL | 3 refills | Status: DC

## 2021-04-07 NOTE — Patient Instructions (Addendum)
Medication Instructions:  INCREASE the Furosemide to 80 mg twice daily INCREASE the Spironolactone to 25 mg daily  BRING YOUR MEDICATIONS TO THE NEXT APPOINTMENT *If you need a refill on your cardiac medications before your next appointment, please call your pharmacy*   Lab Work: None ordered If you have labs (blood work) drawn today and your tests are completely normal, you will receive your results only by: MyChart Message (if you have MyChart) OR A paper copy in the mail If you have any lab test that is abnormal or we need to change your treatment, we will call you to review the results.   Testing/Procedures: None ordered   Follow-Up: At [facility], you and your health needs are our priority.  As part of our continuing mission to provide you with exceptional heart care, we have created designated Provider Care Teams.  These Care Teams include your primary Cardiologist (physician) and Advanced Practice Providers (APPs -  Physician Assistants and Nurse Practitioners) who all work together to provide you with the care you need, when you need it.  We recommend signing up for the patient portal called "MyChart".  Sign up information is provided on this After Visit Summary.  MyChart is used to connect with patients for Virtual Visits (Telemedicine).  Patients are able to view lab/test results, encounter notes, upcoming appointments, etc.  Non-urgent messages can be sent to your provider as well.   To learn more about what you can do with MyChart, go to [email or web address].    Your next appointment:   Follow up in one week with Dr. Royann Shivers or app

## 2021-04-07 NOTE — Progress Notes (Signed)
Cardiology Office Note:    Date:  04/12/2021   ID:  Zachary Hawkins, DOB [birth year], MRN [id number]  PCP:  System, Provider Not In   [facility] Providers Cardiologist:  Thurmon FairMihai Zayda Angell, MD     Referring MD: No ref. provider found   Chief Complaint  Patient presents with   Congestive Heart Failure           History of Present Illness:    Zachary Hawkins is a 65 y.o. male with a hx of morbid obesity, obstructive sleep apnea (untreated), dilated cardiomyopathy (EF 40-45% echo 2019, improved to 60-65% in September 2022), history of smoking, chronic kidney disease stage III, hypertension and paroxysmal atrial fibrillation.  Due to financial difficulties, he has been unable to afford CPAP and often runs out of his medications.  He has a history of recurrent angioedema that has occurred both with and without ACE inhibitors.  Shortly after his last cardiology office appointment he was hospitalized for heart failure exacerbation and hypercapnic/hypoxemic respiratory failure.  He was endotracheally intubated for about 2 days, he in the hospital from November 1 to November 7 .  Cardiology was not notified or consulted at that admission.  He was treated both with diuretics and antibiotics for presumed pneumonia and heart failure.    He continues to have orthopnea.  He was discharged with home oxygen 4-6 L/min and is still running this at 3-5 L/min.  He uses CPAP at night for a few hours, but then takes it off.  He reports that he is no longer drinking alcohol and not smoking.  He would like a portable oxygen concentrator, but I explained that his oxygen requirements are still too high for this.  Hospital discharge weight was 145.8 kg (322 pounds) he was seen back in the clinic 11/21 by Bernadene PersonEmily Monge, NP and weight 338 pounds.  Today he weighs 341 pounds.  He has still not had the recommended pulmonary clinic evaluation, pulmonary function tests or sleep study.  Social situation remains  precarious.  He is living in his sister's home, but she is deceased.  He has had 3 separate episodes of throat swelling this year.  Although the initial episode in June was felt to be related to treatment with lisinopril, two further events occurred without this medication on August 28 and September 2.  He was recently admitted with an episode of upper airway obstruction due to angioedema, requiring epinephrine.  He was treated with steroids and antihistamines.  Labs noted for normal C4 suggesting against C1 esterase deficiency. Tryptase negative, C1 Esterase Inhibitor slightly elevated at 52.  He was discharged on January 17, 2021.  Cardiology was not consulted during that admission and it does not appear that he required diuretics.  An echocardiogram performed during that hospitalization showed normalization of left ventricular systolic function although there was evidence of grade 1 diastolic dysfunction.  Both atria were described as normal in size.  Past Medical History:  Diagnosis Date   Cardiomyopathy (HCC) 06/2017   EF 40-45%   Chronic renal insufficiency, stage 3 (moderate) (HCC)    Diastolic dysfunction 06/2017   grade 2    Hypertension    Hypertensive urgency    Obesity     History reviewed. No pertinent surgical history.  Current Medications: Current Meds  Medication Sig   ADVAIR HFA 115-21 MCG/ACT inhaler INHALE 2 PUFFS INTO THE LUNGS 2 (TWO) TIMES DAILY.   albuterol (VENTOLIN HFA) 108 (90 Base) MCG/ACT inhaler Inhale 2 puffs into  the lungs every 4 (four) hours as needed for wheezing or shortness of breath.   amLODipine (NORVASC) 10 MG tablet Take 10 mg by mouth daily.   apixaban (ELIQUIS) 5 MG TABS tablet Take 1 tablet (5 mg total) by mouth 2 (two) times daily.   bisoprolol (ZEBETA) 5 MG tablet Take 1 tablet (5 mg total) by mouth daily.   EPINEPHrine 0.3 mg/0.3 mL IJ SOAJ injection Inject 0.3 mg into the muscle as needed for anaphylaxis.   famotidine (PEPCID) 20 MG tablet  Take 1 tablet (20 mg total) by mouth 2 (two) times daily.   hydrALAZINE (APRESOLINE) 50 MG tablet Take 0.5 tablets (25 mg total) by mouth 3 (three) times daily. (Patient taking differently: Take 50 mg by mouth 3 (three) times daily.)   isosorbide dinitrate (ISORDIL) 20 MG tablet TAKE 1 TABLET (20 MG TOTAL) BY MOUTH 3 (THREE) TIMES DAILY. SCHEDULE OFFICE VISIT (Patient taking differently: Take 20 mg by mouth 3 (three) times daily.)   Multiple Vitamin (MULTIVITAMIN WITH MINERALS) TABS tablet Take 1 tablet by mouth daily.   polyethylene glycol (MIRALAX / GLYCOLAX) 17 g packet Take 17 g by mouth daily as needed for moderate constipation.   tamsulosin (FLOMAX) 0.4 MG CAPS capsule Take 1 capsule (0.4 mg total) by mouth daily after supper.   [DISCONTINUED] furosemide (LASIX) 80 MG tablet Take 1 tablet (80 mg total) by mouth in the morning AND 0.5 tablets (40 mg total) every evening.   [DISCONTINUED] spironolactone (ALDACTONE) 25 MG tablet Take 0.5 tablets (12.5 mg total) by mouth daily.     Allergies:   Ace inhibitors, Black cherry fruit extract Marcelline Mates extract], Grenadine flavor [flavoring agent], and Chlorhexidine   Social History   Socioeconomic History   Marital status: Single    Spouse name: Not on file   Number of children: Not on file   Years of education: Not on file   Highest education level: Not on file  Occupational History   Not on file  Tobacco Use   Smoking status: Every Day    Packs/day: 1.00    Types: Cigarettes   Smokeless tobacco: Never   Tobacco comments:    1 pack last 2-3 days- states stopped 01/04/21.  Substance and Sexual Activity   Alcohol use: Yes    Alcohol/week: 1.0 standard drink    Types: 1 Shots of liquor per week    Comment: socially   Drug use: Yes    Types: Marijuana    Comment: every other week   Sexual activity: Not on file  Other Topics Concern   Not on file  Social History Narrative   Not on file   Social Determinants of Health   Financial  Resource Strain: Not on file  Food Insecurity: Not on file  Transportation Needs: Not on file  Physical Activity: Not on file  Stress: Not on file  Social Connections: Not on file     Family History: The patient's family history includes Diabetes in his father and mother.  ROS:   Please see the history of present illness.     All other systems reviewed and are negative.  EKGs/Labs/Other Studies Reviewed:    The following studies were reviewed today: Echocardiogram 01/08/2021  1. Left ventricular ejection fraction, by estimation, is 60 to 65%. The  left ventricle has normal function. The left ventricle has no regional  wall motion abnormalities. There is mild left ventricular hypertrophy.  Left ventricular diastolic parameters  are consistent with Grade I diastolic dysfunction (  impaired relaxation).   2. Right ventricular systolic function is normal. The right ventricular  size is normal.   3. The mitral valve is normal in structure. No evidence of mitral valve  regurgitation. No evidence of mitral stenosis.   4. The aortic valve is normal in structure. Aortic valve regurgitation is  not visualized. No aortic stenosis is present.   5. Aortic dilatation noted. There is mild dilatation of the aortic root,  measuring 40 mm. There is borderline dilatation of the ascending aorta,  measuring 39 mm.   6. The inferior vena cava is normal in size with greater than 50%  respiratory variability, suggesting right atrial pressure of 3 mmHg.   EKG:  EKG is not ordered today.  The tracing from 03/30/2021 shows sinus tachycardia, probable LVH with voltage masked by obesity, nonspecific T wave changes  Recent Labs: 10/17/2020: ALT 25 03/10/2021: B Natriuretic Peptide 28.6 03/16/2021: BUN 19; Creatinine, Ser 1.19; Hemoglobin 13.4; Magnesium 2.3; Platelets 214; Potassium 4.7; Sodium 135  Recent Lipid Panel    Component Value Date/Time   CHOL 189 07/11/2020 1103   TRIG 83 03/12/2021 0510   HDL  49 07/11/2020 1103   CHOLHDL 3.9 07/11/2020 1103   LDLCALC 109 (H) 07/11/2020 1103     Risk Assessment/Calculations:    CHA2DS2-VASc Score = 4   This indicates a 4.8% annual risk of stroke. The patient's score is based upon: CHF History: 1 HTN History: 1 Diabetes History: 0 Stroke History: 2 Vascular Disease History: 0 Age Score: 0 Gender Score: 0         Physical Exam:    VS:  BP (!) 142/82   Pulse (!) 106   Ht 6\' 1"  (1.854 m)   Wt (!) 341 lb 9.6 oz (154.9 kg)   SpO2 97%   BMI 45.07 kg/m     Wt Readings from Last 3 Encounters:  04/07/21 (!) 341 lb 9.6 oz (154.9 kg)  03/30/21 (!) 338 lb 3.2 oz (153.4 kg)  03/16/21 (!) 321 lb 6.9 oz (145.8 kg)      General: Alert, oriented x3, no distress, morbidly obese Head: no evidence of trauma, PERRL, EOMI, no exophtalmos or lid lag, no myxedema, no xanthelasma; normal ears, nose and oropharynx Neck: normal jugular venous pulsations and no hepatojugular reflux; brisk carotid pulses without delay and no carotid bruits Chest: clear to auscultation, no signs of consolidation by percussion or palpation, normal fremitus, symmetrical and full respiratory excursions Cardiovascular: normal position and quality of the apical impulse, regular rhythm, normal first and second heart sounds, no murmurs, rubs or gallops Abdomen: no tenderness or distention, no masses by palpation, no abnormal pulsatility or arterial bruits, normal bowel sounds, no hepatosplenomegaly Extremities: no clubbing, cyanosis ; 2+ symmetrical ankle edema; 2+ radial, ulnar and brachial pulses bilaterally; 2+ right femoral, posterior tibial and dorsalis pedis pulses; 2+ left femoral, posterior tibial and dorsalis pedis pulses; no subclavian or femoral bruits Neurological: grossly nonfocal Psych: Normal mood and affect   ASSESSMENT:    1. PAF (paroxysmal atrial fibrillation) ([place])   2. Acute on chronic diastolic (congestive) heart failure ([place])   3. Acute on chronic  respiratory failure with hypoxia and hypercapnia (HCC)   4. OSA (obstructive sleep apnea)   5. Morbid obesity ([place])   6. Chronic obstructive pulmonary disease, unspecified COPD type ([place])     PLAN:    In order of problems listed above:  Afib: This was poorly rate controlled.  He tells me that he was  not prescribed bisoprolol at discharge, although it is on the medication list at discharge.  Prescription sent today. He is on apixaban anticoagulation. CHF: He is again hypervolemic.  He has drained 3 pounds since last week and 19 pounds since hospital discharge.  His dry weight is probably in the 290-300 pound range and he is 40 pounds over that.  Increase furosemide to 80 mg twice daily and spironolactone 25 mg daily.  The most recent echocardiogram shows complete normalization of left ventricular systolic function.  Discussed the importance of avoiding high sodium foods.   He has a history of angioedema (although this appears to be idiopathic and not just associated with with ACE inhibitors) and should not receive ARB or Entresto either.  He is on combination of hydralazine and nitrates and reports compliance with these medications. Acute on chronic hypoxic/hypercapnic respiratory failure: He probably has COPD and/or obesity hypoventilation syndrome.  Noted that his PCO2 was almost 61 his pH was 7.34, venous CO2 30-33 consistent with chronic respiratory acidosis/hypercapnia (CO2 retainer).  Important for him to follow-up in the pulmonary clinic. OSA: He was being seen in the pulmonary clinic and they were "getting him CPAP". Morbid obesity: Underlies many of his respiratory and metabolic problems Smoking: He states that he has not had any cigarettes for the last few months.  He "wants to see his grandkids grow up".        Medication Adjustments/Labs and Tests Ordered: Current medicines are reviewed at length with the patient today.  Concerns regarding medicines are outlined above.  No orders of  the defined types were placed in this encounter.   Meds ordered this encounter  Medications   spironolactone (ALDACTONE) 25 MG tablet    Sig: Take 1 tablet (25 mg total) by mouth daily.    Dispense:  90 tablet    Refill:  3   furosemide (LASIX) 80 MG tablet    Sig: Take 1 tablet (80 mg total) by mouth 2 (two) times daily.    Dispense:  180 tablet    Refill:  3      Patient Instructions  Medication Instructions:  INCREASE the Furosemide to 80 mg twice daily INCREASE the Spironolactone to 25 mg daily  BRING YOUR MEDICATIONS TO THE NEXT APPOINTMENT *If you need a refill on your cardiac medications before your next appointment, please call your pharmacy*   Lab Work: None ordered If you have labs (blood work) drawn today and your tests are completely normal, you will receive your results only by: [place] (if you have MyChart) OR A paper copy in the mail If you have any lab test that is abnormal or we need to change your treatment, we will call you to review the results.   Testing/Procedures: None ordered   Follow-Up: At [facility], you and your health needs are our priority.  As part of our continuing mission to provide you with exceptional heart care, we have created designated Provider Care Teams.  These Care Teams include your primary Cardiologist (physician) and Advanced Practice Providers (APPs -  Physician Assistants and Nurse Practitioners) who all work together to provide you with the care you need, when you need it.  We recommend signing up for the patient portal called "MyChart".  Sign up information is provided on this After Visit Summary.  MyChart is used to connect with patients for Virtual Visits (Telemedicine).  Patients are able to view lab/test results, encounter notes, upcoming appointments, etc.  Non-urgent messages can be sent  to your provider as well.   To learn more about what you can do with MyChart, go to [email or web address].    Your next  appointment:   Follow up in one week with Dr. Royann Shivers or app   Signed, Thurmon Fair, MD  04/12/2021 12:08 PM    [facility]

## 2021-04-08 ENCOUNTER — Ambulatory Visit: Payer: Medicaid Other | Admitting: Cardiovascular Disease

## 2021-04-10 ENCOUNTER — Telehealth: Payer: Self-pay | Admitting: Cardiovascular Disease

## 2021-04-10 NOTE — Telephone Encounter (Signed)
Spoke to [employer] with [employer] calling to ask Dr.Croitoru if he would order cardiac rehab for patient.Advised I will send message to him.

## 2021-04-10 NOTE — Telephone Encounter (Signed)
Crystal from patient's PCP, [facility], calling to request the patient be set up for cardiac rehab.

## 2021-04-12 ENCOUNTER — Encounter: Payer: Self-pay | Admitting: Cardiovascular Disease

## 2021-04-13 NOTE — Telephone Encounter (Signed)
Spoke to patient Dr.Croitoru advised ok to place referral to cardiac rehab.Order placed.Advised someone from cardiac rehab will call back with appointment.Stated he needs a clearance to have dental work done.Advised have dentist fax a clearance to office.

## 2021-04-14 ENCOUNTER — Telehealth: Payer: Self-pay

## 2021-04-14 NOTE — Telephone Encounter (Signed)
Spoke with pharmacy  Bisoprolol was prescribed in hospital Patient has [place] [employer] (a [employer] version) and bisoprolol needs a PA  Will route to primary nurse and MD  Per review of formulary, bisoprolol is non-preferred Alternative beta-blockers covered: Carvedilol  Metoprolol tartrate/succinate Labetalol  Sotalol  Propranolol

## 2021-04-14 NOTE — Telephone Encounter (Signed)
Called the requesting office to ask about number of teeth being extracted.

## 2021-04-14 NOTE — Telephone Encounter (Signed)
   [facility] Pre-operative Risk Assessment    Patient Name: Zachary Hawkins  DOB: [birth year] MRN: [id number]  HEARTCARE STAFF:  - IMPORTANT!!!!!! Under Visit Info/Reason for Call, type in Other and utilize the format Clearance MM/DD/YY or Clearance TBD. Do not use dashes or single digits. - Please review there is not already an duplicate clearance open for this procedure. - If request is for dental extraction, please clarify the # of teeth to be extracted. - If the patient is currently at the dentist's office, call Pre-Op Callback Staff (MA/nurse) to input urgent request.  - If the patient is not currently in the dentist office, please route to the Pre-Op pool.  Request for surgical clearance:  What type of surgery is being performed? Cleaning and extraction   When is this surgery scheduled? TBD  What type of clearance is required (medical clearance vs. Pharmacy clearance to hold med vs. Both)? Both   Are there any medications that need to be held prior to surgery and how long? Eliquis   Practice name and name of physician performing surgery? Zachary Hawkins, DDS, Dr. Juanda Crumble  What is the office phone number? [phone]   7.   What is the office fax number? [phone]  8.   Anesthesia type (None, local, MAC, general) ? Local with Epinephrine   Zachary Hawkins 04/14/2021, 12:43 PM  _________________________________________________________________   (provider comments below)

## 2021-04-14 NOTE — Telephone Encounter (Signed)
Crystal with [facility] is following up. She wanted to ensure that referral for Cardiac Rehab has been placed and I informed her, per previous note from LPN, the order has been placed. Cardiac Rehab will contact patient to coordinate scheduling.  Crystal also wanted to inform our office that a clearance request was sent on yesterday, 12/05. Please confirm received when able.  Pt c/o medication issue:  1. Name of Medication:  bisoprolol (ZEBETA) 5 MG tablet 2. How are you currently taking this medication (dosage and times per day)?   3. Are you having a reaction (difficulty breathing--STAT)?   4. What is your medication issue?   Crystal states she also spoke with patient's local pharmacy, [facility]. Pharmacy is requesting a PA from Dr. Royann Shivers in order to distribute Bisoprolol. It was prescribed in the hospital, but they are unable to contact the prescribing physician. Okay for patient to continue on Bisoprolol or does he need to restart Coreg? Please advise with pharmacy.

## 2021-04-16 NOTE — Telephone Encounter (Signed)
Called DDS office to confirm howe many teeth to be extracted. I stated that I know our office had s/w their office 2 days ago, but I did not see that there was a number of how many teeth being extracted. S/w Annmarie, who reviewed their notes to see if the dentist has stated how many teeth for extraction, though still not noted in chart. Annmarie, states she will send another note to the dentist asking how many teeth for extraction. I did advised as the pt is on Eliquis, the amount teeth for extraction will determine how long Eliquis is held for.   Annmarie, called back and stated to me that Dr. Hulen Shouts, DDS stated procedure is:   4 teeth to be surgically extracted 5 teeth simple extractions Partial implant  I stated to [facility], to please confirm if this all being done in 1 visit, or is this a multi Tx plan. Asked if she will call me back once confirmed with Dr. Dala Dock. I apologized for the extra work. However, if it is multi tx plan then we will need a clearance when ready for next Tx plan. We cannot provide a blanket type clearance due to blood thinner. Annmarie, will confirm with Dr. Dala Dock and call me back.

## 2021-04-16 NOTE — Telephone Encounter (Signed)
Prior Auth for Bisoprolol sent through CoverMyMeds: Key: [id number] and has been approved through 04/16/2022  [facility] has been made aware

## 2021-04-17 NOTE — Telephone Encounter (Signed)
I s/w Nash Dimmer with Dr. Nancy Nordmann, DDS office. I stated that I had called yesterday and s/w Annmarie, see previous notes. Annmarie was going to find out from Dr. Dala Dock if these procedures listed are being done in a one time visit or if they are going to be done in stages or multi Tx plan. Advised if mulit Tx plan we can only give clearance for the procedure ot be done at this time. Will need a new clearance sent over when ready for each Tx plan. Pt is on Eliquis and we cannot keep pt's off blood thinners for extended period of time with risk. Nash Dimmer verbalized understanding and will send message to Dr. Dala Dock, who will be back in the office Monday 04/20/21. I gave my direct # [phone] to call back on Monday. Informed that the pt has an appt 04/24/21 with his cardiologist which I will update as well.

## 2021-04-20 ENCOUNTER — Telehealth (HOSPITAL_COMMUNITY): Payer: Self-pay

## 2021-04-20 ENCOUNTER — Encounter (HOSPITAL_COMMUNITY): Payer: Self-pay

## 2021-04-20 NOTE — Telephone Encounter (Signed)
Clinical pharmacist to review Eliquis 

## 2021-04-20 NOTE — Telephone Encounter (Addendum)
   Name: Zachary Hawkins  DOB: 02/28/1956  MRN: [id number]   Primary Cardiologist: Thurmon Fair, MD  Chart reviewed as part of pre-operative protocol coverage. Patient was contacted 04/20/2021 in reference to pre-operative risk assessment for pending surgery as outlined below.  Zachary Hawkins was last seen on 04/07/2021 by Dr. Royann Shivers.  Since that day, Zachary Hawkins has been fairly stable.  Procedure is considered low risk procedure.  Note, patient has severe obstructive sleep apnea and chronic respiratory failure.  He requires high flow oxygen at home.  Local anesthesia would be the only anesthesia type that he may tolerate.  He should not be given any sedation or general anesthesia for any reason given high risk of respiratory failure and intubation due to breathing issue. His breathing should be closely observed during the procedure.   Will forward to Dr. Royann Shivers to make him aware as well.  Patient does not require SBE prophylaxis.  Clinical pharmacist has recommended to hold Eliquis for 2 days prior to each of the upcoming dental procedures and restart as soon as possible afterward at the surgeon's discretion.  Therefore, based on ACC/AHA guidelines, the patient would be at acceptable risk for the planned procedure without further cardiovascular testing.   The patient was advised that if he develops new symptoms prior to surgery to contact our office to arrange for a follow-up visit, and he verbalized understanding.  I will route this recommendation to the requesting party via Epic fax function and remove from pre-op pool. Please call with questions.  [place], [place] 04/20/2021, 5:32 PM

## 2021-04-20 NOTE — Telephone Encounter (Signed)
Pt insurance is active through [place]. Ref# [id number]   Will fax over [facility] Reimbursement form to Dr. Royann Shivers   Will contact pt to see if he is interested in the Cardiac Rehab program. If interested, pt will need to complete f/u appt on. Once completed, pt will be contacted for scheduling upon review by the RN Navigator

## 2021-04-20 NOTE — Telephone Encounter (Signed)
Patient with diagnosis of afib on Eliquis for anticoagulation.    Procedure #1: 4 surgical extractions Procedure #2: 5 simple extractions Procedure #3 (may be combined with #2): partial implant Date of procedure: TBD  CHA2DS2-VASc Score = 2  This indicates a 2.2% annual risk of stroke. The patient's score is based upon: CHF History: 1 HTN History: 1 Diabetes History: 0 Stroke History: 0 Vascular Disease History: 0 Age Score: 0 Gender Score: 0  CrCl 38mL/min using adjusted body weight Platelet count 214K  Patient does not require pre-op antibiotics for dental procedure.  Per office protocol, patient can hold Eliquis for 2 days prior to each of his upcoming dental procedures.

## 2021-04-20 NOTE — Telephone Encounter (Signed)
Case Manager from PCP office called and asked is there something she can do to help expedite clearance for the pt. Pt stated to Case Manager he is in so much pain and needs the dental work to be done. I stated to Case Manager there is nothing she can do to help expedite clearance. I assured, Case Manager that I will have pre op provider look at clearance now. I did inform the Case Manager that the delay has been that we started out with incomplete information. I did inform that when sending a clearance to cardiology most of the pt's are on anticoagulation therapy. Advised that it is important to not keep the pt off their blood thinner for extended period of time as this put's the pt at high risk for stroke. The pt has appt 12/16.

## 2021-04-20 NOTE — Telephone Encounter (Signed)
PCP office called and informed dental Tx plan will be multi Tx plan for the pt. I explained that cardiology cannot provide a blanket type clearance due to blood thinner. Pt is on Eliquis. Pt's cannot be off their blood thinners for extended period of time due to increased risk of stroke. Informed that cardiologist will assess for procedure for clearance. Dental office will need to fax over new clearance when ready for the next Tx plan.   First procedure will be 4 teeth surgically extracted  2nd procedure will be 5 teeth simple extractions  3rd procedure will be partial implant; per PCP office this may be combined with the 2nd procedure.  I will resend back to pre op pool for final notes and recommendations.

## 2021-04-20 NOTE — Telephone Encounter (Signed)
Called patient to see if he is interested in the Cardiac Rehab Program. Patient expressed interest. Explained scheduling process and went over insurance process, patient verbalized understanding. I advised pt that I will pass his information to the nurse navigator and we will contact him at a later date for scheduling.

## 2021-04-21 ENCOUNTER — Telehealth: Payer: Self-pay | Admitting: Cardiovascular Disease

## 2021-04-21 NOTE — Telephone Encounter (Signed)
Follow Up:    Whiney from [facility] is calling t o check on the status of patient's clearance. Please fax asap to  [facility] [phone].

## 2021-04-21 NOTE — Telephone Encounter (Signed)
Returned call and was provided with a main fax number, [phone]  to send the clearance to. Was unable to determine if they had received our fax as of yet since fax number provided earlier today was unfamiliar to them, she believed that it may have been a direct personal fax for Whitney. Will fax now.

## 2021-04-21 NOTE — Telephone Encounter (Signed)
I forwarded the cardiac clearance to the dental office yesterday and again today. Please check to see if they have received it.

## 2021-04-22 ENCOUNTER — Telehealth: Payer: Self-pay

## 2021-04-22 NOTE — Telephone Encounter (Signed)
° °  Pre-operative Risk Assessment    Patient Name: Zachary Hawkins  DOB: [birth year] MRN: [id number]     Request for Surgical Clearance    Procedure:  Dental Extraction  7 teeth  Date of Surgery:  Clearance TBD                                 Surgeon:  Stephenie Acres DDS Surgeon's Group or Practice Name:  [facility] Phone number:  [phone] Fax number:  [phone]   Type of Clearance Requested:   Pharmacy Eliquis   Type of Anesthesia:  Local    Additional requests/questions:  Please advise surgeon/provider what medications should be held.  Zachary Hawkins   04/22/2021, 8:16 AM

## 2021-04-22 NOTE — Telephone Encounter (Signed)
This is a duplicate, clearance has been manually faxed.

## 2021-04-22 NOTE — Telephone Encounter (Signed)
This is a duplicate request, please verify with the dentist office if they have received the previous clearance note from 04/14/2021.

## 2021-04-23 ENCOUNTER — Ambulatory Visit: Payer: Medicaid Other | Admitting: Physician Assistant

## 2021-04-24 ENCOUNTER — Encounter: Payer: Self-pay | Admitting: Cardiovascular Disease

## 2021-04-24 ENCOUNTER — Other Ambulatory Visit: Payer: Self-pay

## 2021-04-24 ENCOUNTER — Ambulatory Visit (INDEPENDENT_AMBULATORY_CARE_PROVIDER_SITE_OTHER): Payer: Medicaid Other | Admitting: Cardiovascular Disease

## 2021-04-24 VITALS — BP 102/76 | HR 78 | Ht 73.0 in | Wt 347.0 lb

## 2021-04-24 DIAGNOSIS — G4733 Obstructive sleep apnea (adult) (pediatric): Secondary | ICD-10-CM

## 2021-04-24 DIAGNOSIS — I5033 Acute on chronic diastolic (congestive) heart failure: Secondary | ICD-10-CM | POA: Diagnosis not present

## 2021-04-24 DIAGNOSIS — I480 Paroxysmal atrial fibrillation: Secondary | ICD-10-CM | POA: Diagnosis not present

## 2021-04-24 DIAGNOSIS — J9622 Acute and chronic respiratory failure with hypercapnia: Secondary | ICD-10-CM

## 2021-04-24 DIAGNOSIS — Z87891 Personal history of nicotine dependence: Secondary | ICD-10-CM

## 2021-04-24 MED ORDER — METOLAZONE 2.5 MG PO TABS: 2.5000 mg | ORAL_TABLET | ORAL | 3 refills | Status: DC

## 2021-04-24 NOTE — Progress Notes (Signed)
Cardiology Office Note:    Date:  04/28/2021   ID:  Zachary, Hawkins Jul 30, 1955, MRN [id number]  PCP:  System, Provider Not In   [facility] Providers Cardiologist:  Thurmon Fair, MD     Referring MD: No ref. provider found   Chief Complaint  Patient presents with   Congestive Heart Failure   History of Present Illness:    Zachary Hawkins is a 65 y.o. male with a hx of morbid obesity, obstructive sleep apnea (untreated), dilated cardiomyopathy (EF 40-45% echo 2019, improved to 60-65% in September 2022), history of smoking, chronic kidney disease stage III, hypertension and paroxysmal atrial fibrillation.  Due to financial difficulties, he has been unable to afford CPAP and often runs out of his medications.  He has a history of recurrent angioedema that has occurred both with and without ACE inhibitors.  He was most recently hospitalized November 1 - November 7, including a few days when he was endotracheally intubated for acute hypoxic and hypercapnic respiratory failure.  Cardiology was not consulted at that time.  When we saw him in clinic 04/07/2021 he weighed 342 pounds, already 20 pounds up from his hospital discharge weight.  We increase his loop diuretic, but he has gained 5 more pounds since then.  He remains on oxygen at 3 L/min.  He reports that he uses CPAP intermittently since he cannot tolerate at all now.  He reports that he has managed to stay away from smoking cigarettes.  He reports that he is not adding salt to his food, but he is eating a lot of foods that have salty ingredients to begin with.  He sleeps sitting up.  He has tight lower extremity edema.  He denies chest pain, dizziness, palpitations, syncope.   He has had 3 separate episodes of throat swelling this year.  Although the initial episode in June was felt to be related to treatment with lisinopril, two further events occurred without this medication on August 28 and September 2.  He was recently  admitted with an episode of upper airway obstruction due to angioedema, requiring epinephrine.  He was treated with steroids and antihistamines.  Labs noted for normal C4 suggesting against C1 esterase deficiency. Tryptase negative, C1 Esterase Inhibitor slightly elevated at 52.  He was discharged on January 17, 2021.  Cardiology was not consulted during that admission and it does not appear that he required diuretics.  An echocardiogram performed during that hospitalization showed normalization of left ventricular systolic function although there was evidence of grade 1 diastolic dysfunction.  Both atria were described as normal in size.  Past Medical History:  Diagnosis Date   Cardiomyopathy (HCC) 06/2017   EF 40-45%   Chronic renal insufficiency, stage 3 (moderate) (HCC)    Diastolic dysfunction 06/2017   grade 2    Hypertension    Hypertensive urgency    Obesity     History reviewed. No pertinent surgical history.  Current Medications: Current Meds  Medication Sig   ADVAIR HFA 115-21 MCG/ACT inhaler INHALE 2 PUFFS INTO THE LUNGS 2 (TWO) TIMES DAILY.   amLODipine (NORVASC) 10 MG tablet Take 10 mg by mouth daily.   apixaban (ELIQUIS) 5 MG TABS tablet Take 1 tablet (5 mg total) by mouth 2 (two) times daily.   bisoprolol (ZEBETA) 5 MG tablet Take 1 tablet (5 mg total) by mouth daily.   famotidine (PEPCID) 20 MG tablet Take 1 tablet (20 mg total) by mouth 2 (two) times daily.  furosemide (LASIX) 80 MG tablet Take 1 tablet (80 mg total) by mouth 2 (two) times daily.   hydrALAZINE (APRESOLINE) 50 MG tablet Take 0.5 tablets (25 mg total) by mouth 3 (three) times daily. (Patient taking differently: Take 50 mg by mouth 3 (three) times daily.)   isosorbide dinitrate (ISORDIL) 20 MG tablet TAKE 1 TABLET (20 MG TOTAL) BY MOUTH 3 (THREE) TIMES DAILY. SCHEDULE OFFICE VISIT (Patient taking differently: Take 20 mg by mouth 3 (three) times daily.)   metolazone (ZAROXOLYN) 2.5 MG tablet Take 1 tablet  (2.5 mg total) by mouth 2 (two) times a week.   tamsulosin (FLOMAX) 0.4 MG CAPS capsule Take 1 capsule (0.4 mg total) by mouth daily after supper.     Allergies:   Ace inhibitors, Black cherry fruit extract Marcelline Mates extract], Grenadine flavor [flavoring agent], and Chlorhexidine   Social History   Socioeconomic History   Marital status: Single    Spouse name: Not on file   Number of children: Not on file   Years of education: Not on file   Highest education level: Not on file  Occupational History   Not on file  Tobacco Use   Smoking status: Every Day    Packs/day: 1.00    Types: Cigarettes   Smokeless tobacco: Never   Tobacco comments:    1 pack last 2-3 days- states stopped 01/04/21.  Substance and Sexual Activity   Alcohol use: Yes    Alcohol/week: 1.0 standard drink    Types: 1 Shots of liquor per week    Comment: socially   Drug use: Yes    Types: Marijuana    Comment: every other week   Sexual activity: Not on file  Other Topics Concern   Not on file  Social History Narrative   Not on file   Social Determinants of Health   Financial Resource Strain: Not on file  Food Insecurity: Not on file  Transportation Needs: Not on file  Physical Activity: Not on file  Stress: Not on file  Social Connections: Not on file     Family History: The patient's family history includes Diabetes in his father and mother.  ROS:   Please see the history of present illness.     All other systems reviewed and are negative.  EKGs/Labs/Other Studies Reviewed:    The following studies were reviewed today: Echocardiogram 01/08/2021  1. Left ventricular ejection fraction, by estimation, is 60 to 65%. The  left ventricle has normal function. The left ventricle has no regional  wall motion abnormalities. There is mild left ventricular hypertrophy.  Left ventricular diastolic parameters  are consistent with Grade I diastolic dysfunction (impaired relaxation).   2. Right ventricular  systolic function is normal. The right ventricular  size is normal.   3. The mitral valve is normal in structure. No evidence of mitral valve  regurgitation. No evidence of mitral stenosis.   4. The aortic valve is normal in structure. Aortic valve regurgitation is  not visualized. No aortic stenosis is present.   5. Aortic dilatation noted. There is mild dilatation of the aortic root,  measuring 40 mm. There is borderline dilatation of the ascending aorta,  measuring 39 mm.   6. The inferior vena cava is normal in size with greater than 50%  respiratory variability, suggesting right atrial pressure of 3 mmHg.   EKG:  EKG is not ordered today.  The tracing (personally reviewed) 03/30/2021 shows sinus tachycardia, probable LVH with voltage masked by obesity, nonspecific T  wave changes  Recent Labs: 10/17/2020: ALT 25 03/10/2021: B Natriuretic Peptide 28.6 03/16/2021: BUN 19; Creatinine, Ser 1.19; Hemoglobin 13.4; Magnesium 2.3; Platelets 214; Potassium 4.7; Sodium 135  Recent Lipid Panel    Component Value Date/Time   CHOL 189 07/11/2020 1103   TRIG 83 03/12/2021 0510   HDL 49 07/11/2020 1103   CHOLHDL 3.9 07/11/2020 1103   LDLCALC 109 (H) 07/11/2020 1103     Risk Assessment/Calculations:    CHA2DS2-VASc Score = 2   This indicates a 2.2% annual risk of stroke. The patient's score is based upon: CHF History: 1 HTN History: 1 Diabetes History: 0 Stroke History: 0 Vascular Disease History: 0 Age Score: 0 Gender Score: 0          Physical Exam:    VS:  BP 102/76 (BP Location: Left Arm, Patient Position: Sitting, Cuff Size: Large)    Pulse 78    Ht 6\' 1"  (1.854 m)    Wt (!) 347 lb (157.4 kg)    SpO2 93%    BMI 45.78 kg/m     Wt Readings from Last 3 Encounters:  04/24/21 (!) 347 lb (157.4 kg)  04/07/21 (!) 341 lb 9.6 oz (154.9 kg)  03/30/21 (!) 338 lb 3.2 oz (153.4 kg)      General: Alert, oriented x3, no distress, morbidly obese Head: no evidence of trauma, PERRL,  EOMI, no exophtalmos or lid lag, no myxedema, no xanthelasma; normal ears, nose and oropharynx Neck: Hard to see jugular venous pulsations due to obesity; brisk carotid pulses without delay and no carotid bruits Chest: clear to auscultation, no signs of consolidation by percussion or palpation, normal fremitus, symmetrical and full respiratory excursions Cardiovascular: normal position and quality of the apical impulse, regular rhythm, normal first and second heart sounds, no murmurs, rubs or gallops Abdomen: no tenderness or distention, no masses by palpation, no abnormal pulsatility or arterial bruits, normal bowel sounds, no hepatosplenomegaly Extremities: no clubbing, cyanosis ; 3+ symmetrical ankle and pretibial bilateral edema; 2+ radial, ulnar and brachial pulses bilaterally; 2+ right femoral, posterior tibial and dorsalis pedis pulses; 2+ left femoral, posterior tibial and dorsalis pedis pulses; no subclavian or femoral bruits Neurological: grossly nonfocal Psych: Normal mood and affect   ASSESSMENT:    1. PAF (paroxysmal atrial fibrillation) ([place])   2. Acute on chronic diastolic (congestive) heart failure ([place])   3. 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 OSA (obstructive sleep apnea)   5. Morbid obesity ([place])   6. History of cigarette smoking      PLAN:    In order of problems listed above:  Afib: Appropriately anticoagulated with Eliquis.  On bisoprolol for rate control.  Currently in sinus rhythm. CHF: He continues to have steady weight gain despite high-dose loop diuretic.  We will add metolazone 30 minutes before his morning dose of furosemide twice weekly and bring him back in a couple of weeks for labs and reassessment of volume status.  Hopefully we can avoid readmission for intravenous diuretics, but I am concerned that he will require hospitalization again before the end of the year.  The most recent echocardiogram shows complete normalization of  left ventricular systolic function.  We reviewed the importance of limiting sodium in his diet.  ACE inhibitor's were stopped when he developed angioedema, although this has occurred independently of treatment with ACE inhibitor's as well and we are avoiding ARB and Entresto.  On hydralazine/ nitrates and spironolactone.  BP precludes additional vasodilator  medications. Acute on chronic hypoxic/hypercapnic respiratory failure: He has not yet had PFTs or follow-up in the pulmonary clinic.  He probably has COPD and/or obesity hypoventilation syndrome.  Noted that his PCO2 was almost 61 his pH was 7.34, venous CO2 30-33 consistent with chronic respiratory acidosis/hypercapnia (CO2 retainer).  Important for him to follow-up in the pulmonary clinic. OSA: He was being seen in the pulmonary clinic and they were "getting him CPAP".  He reports that he just got his CPAP and that he is usually using it for 2 or 3 hours every night, cannot tolerate it longer than that.  Discussed the relationship between untreated sleep apnea and arrhythmia and heart failure. Morbid obesity: Underlies many of his respiratory and metabolic problems Smoking: He is now quit for about 4 months.  He states that he has not had any cigarettes for the last few months.  He "wants to see his grandkids grow up".        Medication Adjustments/Labs and Tests Ordered: Current medicines are reviewed at length with the patient today.  Concerns regarding medicines are outlined above.  No orders of the defined types were placed in this encounter.    Meds ordered this encounter  Medications   metolazone (ZAROXOLYN) 2.5 MG tablet    Sig: Take 1 tablet (2.5 mg total) by mouth 2 (two) times a week.    Dispense:  10 tablet    Refill:  3       Patient Instructions  Medication Instructions:  START Metolazone 2.5 mg twice a week. Take it 30 minutes before your other medications.   IT IS VERY IMPORTANT THAT YOU BRING YOU MEDICATIONS TO  YOUR NEXT APPOINTMENT  *If you need a refill on your cardiac medications before your next appointment, please call your pharmacy*   Lab Work: None ordered If you have labs (blood work) drawn today and your tests are completely normal, you will receive your results only by: [place] (if you have MyChart) OR A paper copy in the mail If you have any lab test that is abnormal or we need to change your treatment, we will call you to review the results.   Testing/Procedures: None ordered   Follow-Up: At [facility], you and your health needs are our priority.  As part of our continuing mission to provide you with exceptional heart care, we have created designated Provider Care Teams.  These Care Teams include your primary Cardiologist (physician) and Advanced Practice Providers (APPs -  Physician Assistants and Nurse Practitioners) who all work together to provide you with the care you need, when you need it.  We recommend signing up for the patient portal called "MyChart".  Sign up information is provided on this After Visit Summary.  MyChart is used to connect with patients for Virtual Visits (Telemedicine).  Patients are able to view lab/test results, encounter notes, upcoming appointments, etc.  Non-urgent messages can be sent to your provider as well.   To learn more about what you can do with MyChart, go to [email or web address].    Your next appointment:   2 week(s)  The format for your next appointment:   In Person  Provider:   APP     Signed, Sanda Klein, MD  04/28/2021 9:01 AM    [place] He was most recently hospitalized November 1 - November 7 including 2 days of endotracheal intubation for acute respiratory failure

## 2021-04-24 NOTE — Patient Instructions (Signed)
Medication Instructions:  START Metolazone 2.5 mg twice a week. Take it 30 minutes before your other medications.   IT IS VERY IMPORTANT THAT YOU BRING YOU MEDICATIONS TO YOUR NEXT APPOINTMENT  *If you need a refill on your cardiac medications before your next appointment, please call your pharmacy*   Lab Work: None ordered If you have labs (blood work) drawn today and your tests are completely normal, you will receive your results only by: MyChart Message (if you have MyChart) OR A paper copy in the mail If you have any lab test that is abnormal or we need to change your treatment, we will call you to review the results.   Testing/Procedures: None ordered   Follow-Up: At [facility], you and your health needs are our priority.  As part of our continuing mission to provide you with exceptional heart care, we have created designated Provider Care Teams.  These Care Teams include your primary Cardiologist (physician) and Advanced Practice Providers (APPs -  Physician Assistants and Nurse Practitioners) who all work together to provide you with the care you need, when you need it.  We recommend signing up for the patient portal called "MyChart".  Sign up information is provided on this After Visit Summary.  MyChart is used to connect with patients for Virtual Visits (Telemedicine).  Patients are able to view lab/test results, encounter notes, upcoming appointments, etc.  Non-urgent messages can be sent to your provider as well.   To learn more about what you can do with MyChart, go to [email or web address].    Your next appointment:   2 week(s)  The format for your next appointment:   In Person  Provider:   APP

## 2021-04-28 ENCOUNTER — Encounter: Payer: Self-pay | Admitting: Cardiovascular Disease

## 2021-04-28 ENCOUNTER — Telehealth: Payer: Self-pay | Admitting: Cardiovascular Disease

## 2021-04-28 NOTE — Telephone Encounter (Signed)
Called and spoke with [facility] with [facility]. She stated that the patient has not been compliant with wearing his oxygen nor taking his medications. She stated that the patient has not picked up his Metolazone yet. They were going to send a mobile unit to the house but they were overbooked. He has an appointment with their PCP tomorrow morning at 9am.  Call placed to the patient. He was audibly very short of breath on the phone and hardly able to speak. He was confused about what medications he was supposed to be on. We have gone through his medication list for clarification.   He stated that he is on 5 liters of oxygen and his oxygen level was 88%. The nurse with [facility] stated that he had swelling from his feet to his scrotum area. The patient has been advised that he needs to go to the Emergency Room for IV diuretics. He adamantly refused stating that he will not go. He stated that he had an appointment tomorrow morning and that he could wait until then. He has been advised that at his point he most likely needs IV diuretics and that they may send him to the ED tomorrow morning at his appointment.   He once again has been advised to go to the ED. He stated that if he feels like he is getting worse then he promised that he would go.

## 2021-04-28 NOTE — Telephone Encounter (Signed)
Pt called their office stating he is having scrotal swelling and SOB. She currently has their mobile urgent care going out to see him now. Requesting a callback from Dr. Salena Saner to advise on what he recommends for him upon their arrival.

## 2021-04-29 ENCOUNTER — Inpatient Hospital Stay (HOSPITAL_COMMUNITY)
Admission: EM | Admit: 2021-04-29 | Discharge: 2021-05-04 | DRG: 291 | Disposition: A | Discharge status: Home / Self Care | Payer: Medicaid Other | Attending: Family Medicine | Admitting: Family Medicine

## 2021-04-29 ENCOUNTER — Observation Stay (HOSPITAL_COMMUNITY): Payer: Medicaid Other

## 2021-04-29 ENCOUNTER — Encounter (HOSPITAL_COMMUNITY): Payer: Self-pay | Admitting: Emergency Medicine

## 2021-04-29 ENCOUNTER — Emergency Department (HOSPITAL_COMMUNITY): Payer: Medicaid Other

## 2021-04-29 DIAGNOSIS — J449 Chronic obstructive pulmonary disease, unspecified: Secondary | ICD-10-CM | POA: Diagnosis present

## 2021-04-29 DIAGNOSIS — N5089 Other specified disorders of the male genital organs: Secondary | ICD-10-CM

## 2021-04-29 DIAGNOSIS — I5033 Acute on chronic diastolic (congestive) heart failure: Secondary | ICD-10-CM | POA: Diagnosis present

## 2021-04-29 DIAGNOSIS — N4889 Other specified disorders of penis: Secondary | ICD-10-CM

## 2021-04-29 DIAGNOSIS — I5042 Chronic combined systolic (congestive) and diastolic (congestive) heart failure: Secondary | ICD-10-CM | POA: Diagnosis present

## 2021-04-29 DIAGNOSIS — G4733 Obstructive sleep apnea (adult) (pediatric): Secondary | ICD-10-CM | POA: Diagnosis present

## 2021-04-29 DIAGNOSIS — E876 Hypokalemia: Secondary | ICD-10-CM | POA: Diagnosis not present

## 2021-04-29 DIAGNOSIS — N5082 Scrotal pain: Secondary | ICD-10-CM

## 2021-04-29 DIAGNOSIS — J9612 Chronic respiratory failure with hypercapnia: Secondary | ICD-10-CM | POA: Diagnosis present

## 2021-04-29 DIAGNOSIS — R0602 Shortness of breath: Secondary | ICD-10-CM

## 2021-04-29 DIAGNOSIS — N433 Hydrocele, unspecified: Secondary | ICD-10-CM | POA: Diagnosis present

## 2021-04-29 DIAGNOSIS — N1831 Chronic kidney disease, stage 3a: Secondary | ICD-10-CM | POA: Diagnosis present

## 2021-04-29 DIAGNOSIS — N400 Enlarged prostate without lower urinary tract symptoms: Secondary | ICD-10-CM | POA: Diagnosis present

## 2021-04-29 DIAGNOSIS — R0609 Other forms of dyspnea: Secondary | ICD-10-CM | POA: Diagnosis present

## 2021-04-29 DIAGNOSIS — Z91018 Allergy to other foods: Secondary | ICD-10-CM

## 2021-04-29 DIAGNOSIS — R6889 Other general symptoms and signs: Secondary | ICD-10-CM

## 2021-04-29 DIAGNOSIS — E873 Alkalosis: Secondary | ICD-10-CM | POA: Diagnosis present

## 2021-04-29 DIAGNOSIS — E785 Hyperlipidemia, unspecified: Secondary | ICD-10-CM | POA: Diagnosis present

## 2021-04-29 DIAGNOSIS — Z6841 Body Mass Index (BMI) 40.0 and over, adult: Secondary | ICD-10-CM

## 2021-04-29 DIAGNOSIS — Z9114 Patient's other noncompliance with medication regimen: Secondary | ICD-10-CM

## 2021-04-29 DIAGNOSIS — Z7901 Long term (current) use of anticoagulants: Secondary | ICD-10-CM

## 2021-04-29 DIAGNOSIS — Z87891 Personal history of nicotine dependence: Secondary | ICD-10-CM

## 2021-04-29 DIAGNOSIS — N179 Acute kidney failure, unspecified: Secondary | ICD-10-CM | POA: Diagnosis present

## 2021-04-29 DIAGNOSIS — Z20822 Contact with and (suspected) exposure to covid-19: Secondary | ICD-10-CM | POA: Diagnosis present

## 2021-04-29 DIAGNOSIS — K047 Periapical abscess without sinus: Secondary | ICD-10-CM | POA: Diagnosis present

## 2021-04-29 DIAGNOSIS — Z888 Allergy status to other drugs, medicaments and biological substances status: Secondary | ICD-10-CM

## 2021-04-29 DIAGNOSIS — I480 Paroxysmal atrial fibrillation: Secondary | ICD-10-CM | POA: Diagnosis present

## 2021-04-29 DIAGNOSIS — Z79899 Other long term (current) drug therapy: Secondary | ICD-10-CM

## 2021-04-29 DIAGNOSIS — I10 Essential (primary) hypertension: Secondary | ICD-10-CM | POA: Diagnosis present

## 2021-04-29 DIAGNOSIS — J9611 Chronic respiratory failure with hypoxia: Secondary | ICD-10-CM | POA: Diagnosis present

## 2021-04-29 DIAGNOSIS — I130 Hypertensive heart and chronic kidney disease with heart failure and stage 1 through stage 4 chronic kidney disease, or unspecified chronic kidney disease: Principal | ICD-10-CM | POA: Diagnosis present

## 2021-04-29 DIAGNOSIS — Z833 Family history of diabetes mellitus: Secondary | ICD-10-CM

## 2021-04-29 DIAGNOSIS — Z9981 Dependence on supplemental oxygen: Secondary | ICD-10-CM

## 2021-04-29 DIAGNOSIS — I509 Heart failure, unspecified: Secondary | ICD-10-CM

## 2021-04-29 LAB — URINALYSIS, ROUTINE W REFLEX MICROSCOPIC
Bacteria, UA: NONE SEEN
Bilirubin Urine: NEGATIVE
Glucose, UA: NEGATIVE mg/dL
Ketones, ur: NEGATIVE mg/dL
Leukocytes,Ua: NEGATIVE
Nitrite: NEGATIVE
Protein, ur: NEGATIVE mg/dL
Specific Gravity, Urine: 1.009 (ref 1.005–1.030)
pH: 5 (ref 5.0–8.0)

## 2021-04-29 LAB — BRAIN NATRIURETIC PEPTIDE: B Natriuretic Peptide: 31 pg/mL (ref 0.0–100.0)

## 2021-04-29 LAB — CBC
HCT: 46.1 % (ref 39.0–52.0)
Hemoglobin: 14.1 g/dL (ref 13.0–17.0)
MCH: 29.8 pg (ref 26.0–34.0)
MCHC: 30.6 g/dL (ref 30.0–36.0)
MCV: 97.5 fL (ref 80.0–100.0)
Platelets: 253 10*3/uL (ref 150–400)
RBC: 4.73 MIL/uL (ref 4.22–5.81)
RDW: 14.6 % (ref 11.5–15.5)
WBC: 7.3 10*3/uL (ref 4.0–10.5)
nRBC: 0 % (ref 0.0–0.2)

## 2021-04-29 LAB — TROPONIN I (HIGH SENSITIVITY)
Troponin I (High Sensitivity): 22 ng/L — ABNORMAL HIGH (ref <18)
Troponin I (High Sensitivity): 23 ng/L — ABNORMAL HIGH (ref <18)

## 2021-04-29 LAB — BASIC METABOLIC PANEL
Anion gap: 9 (ref 5–15)
BUN: 27 mg/dL — ABNORMAL HIGH (ref 8–23)
CO2: 38 mmol/L — ABNORMAL HIGH (ref 22–32)
Calcium: 9.5 mg/dL (ref 8.9–10.3)
Chloride: 91 mmol/L — ABNORMAL LOW (ref 98–111)
Creatinine, Ser: 1.78 mg/dL — ABNORMAL HIGH (ref 0.61–1.24)
GFR, Estimated: 42 mL/min — ABNORMAL LOW (ref >60)
Glucose, Bld: 132 mg/dL — ABNORMAL HIGH (ref 70–99)
Potassium: 4.1 mmol/L (ref 3.5–5.1)
Sodium: 138 mmol/L (ref 135–145)

## 2021-04-29 LAB — RESP PANEL BY RT-PCR (FLU A&B, COVID) ARPGX2
Influenza A by PCR: NEGATIVE
Influenza B by PCR: NEGATIVE
SARS Coronavirus 2 by RT PCR: NEGATIVE

## 2021-04-29 LAB — TSH: TSH: 0.962 u[IU]/mL (ref 0.350–4.500)

## 2021-04-29 LAB — MAGNESIUM: Magnesium: 2.1 mg/dL (ref 1.7–2.4)

## 2021-04-29 MED ORDER — SODIUM CHLORIDE 0.9 % IV SOLN: 250.0000 mL | INTRAVENOUS | Status: DC | PRN

## 2021-04-29 MED ORDER — SODIUM CHLORIDE 0.9% FLUSH
3.0000 mL | Freq: Two times a day (BID) | INTRAVENOUS | Status: DC
  Administered 2021-04-29 – 2021-05-04 (×7): 3 mL via INTRAVENOUS

## 2021-04-29 MED ORDER — HYDROCODONE-ACETAMINOPHEN 5-325 MG PO TABS: 1.0000 | ORAL_TABLET | ORAL | Status: DC | PRN

## 2021-04-29 MED ORDER — ALBUTEROL SULFATE (2.5 MG/3ML) 0.083% IN NEBU: 2.5000 mg | INHALATION_SOLUTION | RESPIRATORY_TRACT | Status: DC | PRN

## 2021-04-29 MED ORDER — ISOSORBIDE DINITRATE 10 MG PO TABS
20.0000 mg | ORAL_TABLET | Freq: Three times a day (TID) | ORAL | Status: DC
  Administered 2021-04-29 – 2021-05-04 (×14): 20 mg via ORAL
  Filled 2021-04-29 (×14): qty 2

## 2021-04-29 MED ORDER — FAMOTIDINE 20 MG PO TABS
20.0000 mg | ORAL_TABLET | Freq: Two times a day (BID) | ORAL | Status: DC
  Administered 2021-04-29 – 2021-05-04 (×10): 20 mg via ORAL
  Filled 2021-04-29 (×10): qty 1

## 2021-04-29 MED ORDER — ACETAMINOPHEN 325 MG PO TABS: 650.0000 mg | ORAL_TABLET | Freq: Four times a day (QID) | ORAL | Status: DC | PRN

## 2021-04-29 MED ORDER — FUROSEMIDE 10 MG/ML IJ SOLN
80.0000 mg | Freq: Two times a day (BID) | INTRAMUSCULAR | Status: DC
  Administered 2021-04-29 – 2021-05-01 (×5): 80 mg via INTRAVENOUS
  Filled 2021-04-29 (×6): qty 8

## 2021-04-29 MED ORDER — FLUTICASONE FUROATE-VILANTEROL 200-25 MCG/ACT IN AEPB
1.0000 | INHALATION_SPRAY | Freq: Every day | RESPIRATORY_TRACT | Status: DC
  Administered 2021-04-30 – 2021-05-04 (×5): 1 via RESPIRATORY_TRACT
  Filled 2021-04-29 (×2): qty 28

## 2021-04-29 MED ORDER — SODIUM CHLORIDE 0.9% FLUSH
3.0000 mL | Freq: Two times a day (BID) | INTRAVENOUS | Status: DC
  Administered 2021-04-29 – 2021-05-02 (×6): 3 mL via INTRAVENOUS

## 2021-04-29 MED ORDER — ALBUTEROL SULFATE HFA 108 (90 BASE) MCG/ACT IN AERS: 2.0000 | INHALATION_SPRAY | RESPIRATORY_TRACT | Status: DC | PRN

## 2021-04-29 MED ORDER — SODIUM CHLORIDE 0.9% FLUSH: 3.0000 mL | INTRAVENOUS | Status: DC | PRN

## 2021-04-29 MED ORDER — APIXABAN 5 MG PO TABS
5.0000 mg | ORAL_TABLET | Freq: Two times a day (BID) | ORAL | Status: DC
  Administered 2021-04-29 – 2021-05-04 (×10): 5 mg via ORAL
  Filled 2021-04-29 (×10): qty 1

## 2021-04-29 MED ORDER — AMLODIPINE BESYLATE 10 MG PO TABS
10.0000 mg | ORAL_TABLET | Freq: Every day | ORAL | Status: DC
  Administered 2021-04-30: 10:00:00 10 mg via ORAL
  Filled 2021-04-29: qty 1

## 2021-04-29 MED ORDER — TAMSULOSIN HCL 0.4 MG PO CAPS
0.4000 mg | ORAL_CAPSULE | Freq: Every day | ORAL | Status: DC
  Administered 2021-04-30 – 2021-05-03 (×4): 0.4 mg via ORAL
  Filled 2021-04-29 (×4): qty 1

## 2021-04-29 MED ORDER — AMOXICILLIN 500 MG PO CAPS
500.0000 mg | ORAL_CAPSULE | Freq: Three times a day (TID) | ORAL | Status: DC
  Administered 2021-04-29 – 2021-05-04 (×15): 500 mg via ORAL
  Filled 2021-04-29 (×15): qty 1

## 2021-04-29 MED ORDER — ACETAMINOPHEN 650 MG RE SUPP: 650.0000 mg | Freq: Four times a day (QID) | RECTAL | Status: DC | PRN

## 2021-04-29 MED ORDER — ATORVASTATIN CALCIUM 10 MG PO TABS
10.0000 mg | ORAL_TABLET | Freq: Every day | ORAL | Status: DC
  Administered 2021-04-29 – 2021-05-04 (×6): 10 mg via ORAL
  Filled 2021-04-29 (×6): qty 1

## 2021-04-29 NOTE — ED Notes (Signed)
Lunch tray ordered 

## 2021-04-29 NOTE — H&P (Signed)
History and Physical    Zachary Hawkins FYB:[id number] DOB: 07-28-1955 DOA: 04/29/2021  PCP: [facility]  Consultants:  cardiology: Dr. Royann Shivers, pulm: Dr. Thora Lance (seen one time)  Patient coming from:  Home - lives alone   Chief Complaint: shortness of breath and groin swelling.   HPI: Zachary Hawkins is a 65 y.o. male with medical history significant of CKD stage III, diastolic and systolic CHF, HTN, atrial fibrillation on eliquis, OSA on CPAP at night (doesn't wear nightly), ACE-I angioedema (10/2017), undocumented COPD, chronic respiratory failure on 4-6L [place] after recent hospital discharge in November of this year who presented to ED with complaints of shortness of breath and swollen testicles that started about 2-3 days ago.  He states his "testicles are as big as house and hard."  He doesn't really endorse pain or redness. They are not tender to touch. He has no pain with urination. Denies any dysuria. He is sexually active, no pain with sex.   He also talked to his cardiologist yesterday who advised he come to hospital, but he didn't want to come. He has increased shortness of breath with exertion. He denies any coughing, but has increased leg swelling. He has worsening orthopnea.   He saw Dr. Royann Shivers 04/24/21 and metolazone was added on in the AM before his furosemide twice weekly. He has also been taking ibuprofen for his tooth that was pulled. He has not been compliant with medication and never even picked up the metolazone per cardiology note. He also doesn't wear his oxygen like he is supposed to at home and states when his oxygen was in the 80s today he had taken it off to go to the bathroom and left it off for a bit.   He had recent admit in 03/10/21-03/16/2021 for acute on chronic respiratory failure with pulmonary edema, hx of OSA not treated and suspected COPD with acute exacerbation. He required intubation on 03/11/21 and extubated on 03/12/21. He was discharged on 6L [place] and advised to f/u  with pulm which he has done, but has never completed his PFTs. He also was diuresed and weight at time of discharge was 322 pounds. Weight 5 days ago was 345 pounds.   He denies any fever/chills, no headaches/vision changes, chest pain/palpitations, stomach pain, N/V/D, dysuria. He has to sometimes lift his scrotum up to urinate.    ED Course: vitals: afebrile, bp: 130/72, HR: 78, RR: 18, oxygen: 99% on 3L [place] Pertinent labs: CO2: 38, BUN: 27, creatinine: 1.78 (1.2-1.5), BNP: 31 CXR: cardiomegaly and low lung volumes with bibasilar atelectasis. No acute process.    Review of Systems: As per HPI; otherwise review of systems reviewed and negative.   Ambulatory Status:  Ambulates without assistance   Past Medical History:  Diagnosis Date   Cardiomyopathy (HCC) 06/2017   EF 40-45%   Chronic renal insufficiency, stage 3 (moderate) (HCC)    Diastolic dysfunction 06/2017   grade 2    Hypertension    Hypertensive urgency    Obesity     History reviewed. No pertinent surgical history.  Social History   Socioeconomic History   Marital status: Single    Spouse name: Not on file   Number of children: Not on file   Years of education: Not on file   Highest education level: Not on file  Occupational History   Not on file  Tobacco Use   Smoking status: Every Day    Packs/day: 1.00    Types: Cigarettes   Smokeless  tobacco: Never   Tobacco comments:    1 pack last 2-3 days- states stopped 01/04/21.  Substance and Sexual Activity   Alcohol use: Yes    Alcohol/week: 1.0 standard drink    Types: 1 Shots of liquor per week    Comment: socially   Drug use: Yes    Types: Marijuana    Comment: every other week   Sexual activity: Not on file  Other Topics Concern   Not on file  Social History Narrative   Not on file   Social Determinants of Health   Financial Resource Strain: Not on file  Food Insecurity: Not on file  Transportation Needs: Not on file  Physical Activity: Not on  file  Stress: Not on file  Social Connections: Not on file  Intimate Partner Violence: Not on file    Allergies  Allergen Reactions   Ace Inhibitors Anaphylaxis   Black Cherry Fruit Extract Marcelline Mates Extract] Anaphylaxis    Tongue   Grenadine Flavor [Flavoring Agent] Anaphylaxis   Chlorhexidine     Family History  Problem Relation Age of Onset   Diabetes Mother    Diabetes Father     Prior to Admission medications   Medication Sig Start Date End Date Taking? Authorizing Provider  ADVAIR HFA 115-21 MCG/ACT inhaler INHALE 2 PUFFS INTO THE LUNGS 2 (TWO) TIMES DAILY. 07/23/20   Charlott Rakes, MD  albuterol (VENTOLIN HFA) 108 (90 Base) MCG/ACT inhaler Inhale 2 puffs into the lungs every 4 (four) hours as needed for wheezing or shortness of breath. Patient not taking: Reported on 04/24/2021 02/13/21   Croitoru, Mihai, MD  amLODipine (NORVASC) 10 MG tablet Take 10 mg by mouth daily. 03/10/21   [provider]  apixaban (ELIQUIS) 5 MG TABS tablet Take 1 tablet (5 mg total) by mouth 2 (two) times daily. 01/09/21 04/24/21  Bonnielee Haff, MD  bisoprolol (ZEBETA) 5 MG tablet Take 1 tablet (5 mg total) by mouth daily. 03/16/21   Pokhrel, Corrie Mckusick, MD  EPINEPHrine 0.3 mg/0.3 mL IJ SOAJ injection Inject 0.3 mg into the muscle as needed for anaphylaxis. 01/09/21   Bonnielee Haff, MD  famotidine (PEPCID) 20 MG tablet Take 1 tablet (20 mg total) by mouth 2 (two) times daily. 01/17/21 01/17/22  Julian Hy, DO  furosemide (LASIX) 80 MG tablet Take 1 tablet (80 mg total) by mouth 2 (two) times daily. 04/07/21   Croitoru, Mihai, MD  hydrALAZINE (APRESOLINE) 50 MG tablet Take 0.5 tablets (25 mg total) by mouth 3 (three) times daily. Patient taking differently: Take 50 mg by mouth 3 (three) times daily. 03/09/21 03/09/22  Almyra Deforest, PA  isosorbide dinitrate (ISORDIL) 20 MG tablet TAKE 1 TABLET (20 MG TOTAL) BY MOUTH 3 (THREE) TIMES DAILY. SCHEDULE OFFICE VISIT Patient taking differently: Take 20 mg by  mouth 3 (three) times daily. 10/17/20 10/17/21  Margarita Mail, PA-C  metolazone (ZAROXOLYN) 2.5 MG tablet Take 1 tablet (2.5 mg total) by mouth 2 (two) times a week. 04/27/21 07/26/21  Croitoru, Mihai, MD  Multiple Vitamin (MULTIVITAMIN WITH MINERALS) TABS tablet Take 1 tablet by mouth daily. Patient not taking: Reported on 04/24/2021 01/10/21   Bonnielee Haff, MD  polyethylene glycol (MIRALAX / GLYCOLAX) 17 g packet Take 17 g by mouth daily as needed for moderate constipation. Patient not taking: Reported on 04/24/2021 01/09/21   Bonnielee Haff, MD  spironolactone (ALDACTONE) 25 MG tablet Take 1 tablet (25 mg total) by mouth daily. 04/07/21   Croitoru, Mihai, MD  tamsulosin (FLOMAX) 0.4  MG CAPS capsule Take 1 capsule (0.4 mg total) by mouth daily after supper. 03/16/21   Flora Lipps, MD    Physical Exam: Vitals:   04/29/21 1410 04/29/21 1415 04/29/21 1614 04/29/21 1658  BP: 106/87 120/89 120/75 (!) 142/89  Pulse: 85 78 78 78  Resp: 15 15 18 18   Temp:   98.1 F (36.7 C) 98.2 F (36.8 C)  TempSrc:   Oral Oral  SpO2: 92% 93% 96% 94%     General:  Appears calm and comfortable and is in NAD Eyes:  PERRL, EOMI, normal lids, iris. Clear discharge from left eye  ENT:  grossly normal hearing, lips & tongue, mmm; poor dentition  Neck:  no LAD, masses or thyromegaly; no carotid bruits, no JVD Cardiovascular:  RRR, no m/r/g. Bilateral LE edema, L>R.  Respiratory:   CTA bilaterally with no wheezes/rales/rhonchi.  Dyspneic on talking  Abdomen:  soft, protuberant with umbilical hernia  Back:   normal alignment, no CVAT GU: bilateral edematous scrotum. No erythema or warmth. No TTP.  Skin:  no rash or induration seen on limited exam Musculoskeletal:  grossly normal tone BUE/BLE, good ROM, no bony abnormality Lower extremity:    Limited foot exam with no ulcerations.  2+ distal pulses. Psychiatric:  grossly normal mood and affect, speech fluent and appropriate, AOx3 Neurologic:  CN 2-12 grossly  intact, moves all extremities in coordinated fashion, sensation intact    Radiological Exams on Admission: Independently reviewed - see discussion in A/P where applicable  CT ABDOMEN PELVIS WO CONTRAST  Result Date: 04/29/2021 CLINICAL DATA:  Lymphadenopathy, groin abdominal pain. EXAM: CT ABDOMEN AND PELVIS WITHOUT CONTRAST TECHNIQUE: Multidetector CT imaging of the abdomen and pelvis was performed following the standard protocol without IV contrast. COMPARISON:  None. FINDINGS: Lower chest: Right basilar atelectasis. Hepatobiliary: No focal liver abnormality is seen. No gallstones, gallbladder wall thickening, or biliary dilatation. Pancreas: Unremarkable. No pancreatic ductal dilatation or surrounding inflammatory changes. Spleen: Normal in size without focal abnormality. Adrenals/Urinary Tract: Adrenal glands are unremarkable. Kidneys are normal, without renal calculi, focal lesion, or hydronephrosis. Simple left renal cysts in the upper and lower pole. Bladder is unremarkable. Stomach/Bowel: Stomach is within normal limits. Appendix appears normal. No evidence of bowel wall thickening, distention, or inflammatory changes. Vascular/Lymphatic: Aortic atherosclerosis. Mildly enlarged bilateral inguinal lymph nodes. No retroperitoneal fibrosis or lymphadenopathy. Reproductive: Prostate is mildly enlarged. Other: There is marked skin thickening of the scrotal sac with bilateral hydroceles. Fat containing umbilical hernia. Musculoskeletal: Mild degenerate disc disease of the lumbar spine. No acute osseous abnormality or suspicious osseous lesion. IMPRESSION: 1. Marked skin thickening of the scrotal sac with bilateral hydroceles concerning for cellulitis. Mildly enlarged bilateral inguinal lymph nodes. No abdominal/pelvic lymphadenopathy. This may be secondary to scrotal infectious/inflammatory process. Clinical correlation is suggested. 2.  No CT evidence of acute abdominal/pelvic process. Electronically  Signed   By: Keane Police D.O.   On: 04/29/2021 16:31   DG Chest 2 View  Result Date: 04/29/2021 CLINICAL DATA:  Shortness of breath. Known congestive heart failure. EXAM: CHEST - 2 VIEW COMPARISON:  03/11/2021 FINDINGS: Both views are degraded by patient size. Low lung volumes, especially on the frontal radiograph. Midline trachea. Mild cardiomegaly. Radiopaque densities projecting over the lower chest bilaterally may be external to the patient. Not localized on the frontal. No pleural effusion or pneumothorax. No congestive failure. Bibasilar atelectasis, greater on the right. IMPRESSION: Cardiomegaly and low lung volumes with bibasilar atelectasis. No acute superimposed process. Decreased sensitivity and specificity  exam due to technique related factors, as described above. Electronically Signed   By: Abigail Miyamoto M.D.   On: 04/29/2021 10:55   US SCROTUM W/DOPPLER  Result Date: 04/29/2021 CLINICAL DATA:  A 65 year old male presents for evaluation of scrotal pain. EXAM: SCROTAL ULTRASOUND DOPPLER ULTRASOUND OF THE TESTICLES TECHNIQUE: Complete ultrasound examination of the testicles, epididymis, and other scrotal structures was performed. Color and spectral Doppler ultrasound were also utilized to evaluate blood flow to the testicles. COMPARISON:  None FINDINGS: Right testicle Measurements: 4.5 x 3.5 x 2.9 cm. No mass or microlithiasis visualized. Left testicle Measurements: 4.2 x 3.2 x 3.2 cm. No mass or microlithiasis visualized. Right epididymis:  Normal in size and appearance. Left epididymis:  Normal in size and appearance. Hydrocele: Bilateral large hydroceles. Small areas of echogenic debris without septation within the RIGHT hydrocele. Varicocele: RIGHT-sided varicocele up to 5 mm. No signs of LEFT varicocele. Pulsed Doppler interrogation of both testes demonstrates normal low resistance arterial and venous waveforms bilaterally. IMPRESSION: No signs of testicular torsion. Scrotal wall thickening  and bilateral hydroceles, minimally complex on the RIGHT without signs of septation. Scrotal thickening and hydroceles more likely relate to volume overload and or heart failure/anasarca. Would however correlate with any signs of infection over the scrotum. Potential isolated RIGHT varicocele. Attention on upcoming CT of the abdomen and pelvis for any signs of retroperitoneal mass or similar process that would explain and isolated RIGHT varicocele. Electronically Signed   By: Zetta Bills M.D.   On: 04/29/2021 15:50    EKG: Independently reviewed.  NSR with rate 82; nonspecific ST changes with no evidence of acute ischemia   Labs on Admission: I have personally reviewed the available labs and imaging studies at the time of the admission.  Pertinent labs:  CO2: 38,  BUN: 27,  creatinine: 1.78 (1.2-1.5),  BNP: 31   Assessment/Plan Principal Problem: Worsening dyspnea in setting of Acute exacerbation of CHF (congestive heart failure) and chronic respiratory failure with hypercapnia  -65 year old presenting with 2-3 day history of worsening dyspnea and 20+ pound weight gain with increased LE swelling and scrotal swelling --based off findings on clinical exam including worsening LE edema, scrotal edema, worsening orthopnea and 20+ pound weight gain confirms that patient is in acute on chronic diastolic  CHF exacerbation -? Dry weight around 322 (was 322 on hospital d/c 03/16/21. 345# today) -echo pending. Last echo in 01/2021 showed EF to 60-65% and grade 1 diastolic dysfunction.  -strict I/O and daily weights -IV diuresis 80mg  IV BID per cardiology  -check magnesium and follow electrolytes -telemetry  -hold bisoprolol -consulted cardiology  -has not been compliant with medication at home nor instructions from his cardiologist. Social work consult has been placed to hopefully help with medication.  -not requiring anymore oxygen from baseline, (4-6L, on 3L currently) but unsure if wearing at  home like he should be.   Active Problems: Acute renal failure superimposed on stage 3 chronic kidney disease (HCC) -baseline appears to be 1.2-1.5, was 1.1 on discharge back in November -has not been compliant with medication and never picked up the metolozone.  -has been taking NSAIDs after having his tooth pulled -? If worsening in setting of acute on chronic CHF -will have to watch closely with diuresis  -avoid nephrotoxic drugs, holding spironolactone/oral lasix. IV lasix per cardiology -bmp q am -strict intake/output     Bilateral hydroceles Likely due to volume overload Physical exam not consistent with cellulitis, no abx at this time.  Diurese and monitor clinically Checking urine cytology  Jock strap as tolerated  Cultures if temperature >100.4 and consult urology.  -isolated right varicocele on scrotal US  Paroxysmal atrial fibrillation (HCC) In NSR, rate controlled, continue telemetry  CHaDSVasc score of 2, continue his eliquis for [facility] Hold bisprolol in setting of acute on chronic CHF     Essential hypertension Blood pressure well controlled Diuresis with lasix 80mg  BID per cardiology Holding bisprolol, continue norvasc. Hold hydralazine as BP well controlled and high dose diuresis. Holding spironolactone with acute on CKD.    OSA (obstructive sleep apnea) -not very compliant, wearing about 2-3 hours at a time -will order QHS while here in hospital.   Undocumented COPD -saw pulmonology 1x and has never followed up for PFTs. Appears some confusion with appointments and what he should be doing -no wheezing or exam or findings of exacerbation -not using his inhaler like he should -will continue advair and albuterol prn    Tooth abscess with recent extraction  Continue amoxicillin   There is no height or weight on file to calculate BMI.    Level of care: Progressive DVT prophylaxis:  eliquis  Code Status:  Full - confirmed with patient Family Communication:  None present Disposition Plan:  The patient is from: home  Anticipated d/c is to: home   Patient placed in observation as anticipate less than 2 midnight stay. Requires hospitalization for IV diuresis, close monitoring and MDM with specialists.    Patient is currently: stable  Consults called: cardiology   Admission status:  observation    Orma Flaming MD [facility]   How to contact the [facility] Attending or Consulting provider [place] or covering provider during after hours [place], for this patient?  Check the care team in [facility] and look for a) attending/consulting [facility] provider listed and b) the [facility] team listed Log into [email or web address] and use 's universal password to access. If you do not have the password, please contact the hospital operator. Locate the [facility] provider you are looking for under [facility] and page to a number that you can be directly reached. If you still have difficulty reaching the provider, please page the [facility] (Director on Call) for the Hospitalists listed on amion for assistance.   04/29/2021, 5:15 PM

## 2021-04-29 NOTE — ED Triage Notes (Signed)
Pt endorses groin swelling for a few days and worsening SOB today. Pt normally on 4 L. Endorses soreness to his groin.

## 2021-04-29 NOTE — ED Provider Notes (Signed)
Emergency Medicine Provider Triage Evaluation Note  Zachary Hawkins , a 65 y.o. male  was evaluated in triage.  Pt complains of worsening shortness of breath in the setting of known congestive heart failure.  He is on home oxygen at 3 to 4 L O2 nasal cannula at all times.  Patient also states that he has had some groin swelling for the last 2 days it is been making it difficult for him to urinate.  He has been taking Lasix for fluid in his legs and is concerned that the groin swelling is related.  He denies hematuria, dysuria.  He denies abdominal pain, nausea, vomiting, diarrhea. Review of Systems  Positive: Shortness of breath, leg swelling, groin swelling Negative: Abdominal pain, nausea, vomiting, diarrhea, dysuria, hematuria  Physical Exam  BP 130/72 (BP Location: Left Arm)    Pulse 78    Temp 98.4 F (36.9 C) (Oral)    Resp 18    SpO2 99%  Gen:   Awake, no distress, originally falling asleep during exam Resp:  Normal effort, on nasal cannula, diminished breath sounds bilaterally MSK:   Moves extremities without difficulty  Other:    Medical Decision Making  Medically screening exam initiated at 10:39 AM.  Appropriate orders placed.  Zachary Hawkins was informed that the remainder of the evaluation will be completed by another provider, this initial triage assessment does not replace that evaluation, and the importance of remaining in the ED until their evaluation is complete.     Janell Quiet, [place] 04/29/21 1043    Gloris Manchester, MD 04/29/21 2014

## 2021-04-29 NOTE — Consult Note (Signed)
Cardiology Consultation:   Patient ID: Zachary Hawkins MRN: [id number]; DOB: 11-25-1955  Admit date: 04/29/2021 Date of Consult: 04/29/2021  PCP:  Merryl Hacker, No   [facility] Providers Cardiologist:  Sanda Klein, MD   Patient Profile:   Zachary Hawkins is a 65 y.o. male with a hx of chronic respiratory failure on 3-4 L home O2, OSA not 100% compliant on CPAP, dilated cardiomyopathy (EF 40-45% 2019, improved to 60-65% 2022, hx of smoking, CKD stage III, HTN, and PAF who is being seen 04/29/2021 for the evaluation of CHF exacerbation at the request of Dr. Rogers Blocker.  History of Present Illness:   Mr. Brumfield was initially unable to afford CPAP and has a history of running out of medications. He also has a history of angiodema both with and without ACEI. Echocardiogram 01/08/21 with LVEF 60-65%, grade 1 DD, no RWMA, mildly dilated aortic root of 40 mm. His Afib was poorly rate controlled at his visit in Oct 2022. Coreg was switched to bisoprolol due to reactive airway disease. He was 22 lbs up and lasix was increased to 80 mg daily and spironolactone was added. PCP increased spiro from 25 mg to 100 mg on 03/03/21. He was seen in the office with Almyra Deforest [facility] 03/09/21 and was hypoxic in the 70s and drowsy, which improved with application of O2.  Hospitalization was recommended, but patient deferred. Arlyce Harman reduced back to 25 mg and hydralazine reduced to 25 mg TID.   He was hospitalized in Nov 1-7 2022 for acute hypoxic and hypercapnic respiratory failure requiring intubation. He was diuresed, cardiology was not consulted at that time. At his follow up clinic visit 04/07/21 his weight was 342 lb, up 20 lbs from hospital discharge weight. Diuretic was increased, but he continued to gain weight. At clinic follow up Dr. Sallyanne Kuster and was recently seen in the clinic on 04/24/21 with continued weight gain. Metolazone was added twice weekly. Pt had not been adherent to a low sodium diet and sleep sitting up. Dr.  Sallyanne Kuster was hopeful to avoid admission for IV diuresis.   Pharmacy called our office 04/28/21 stating the patient was not compliant with medications or with O2. He had not picked up his metolazone yet. Call placed to the patient who was short of breath on the phone. He was confused about his medications. He was on 5L O2 and was advised to go to the ER.    Presented to [facility] with worsening shortness of breath with acute on chronic respiratory failure.  He also reported scrotal swelling as well.   In the ER: Acute on chronic kidney insufficiency with sCr 1.78 K 4.1 BNP 31, which may be falsely low in the setting of morbid obesity UA negative for UTI WBC 7.3  During my interview, he states his shortness of breath worsened yesterday. He is unsure how much weight he has gained. He sleeps on a flat bed with three pillows. He tries to use CPAP three times weekly, but uses it for a short time while sitting up in the recliner. He states he is compliant on medications. He presents his pill pack - the last pack taken was for Tues AM. He picked up his metolazone yesterday and took two pills last night. He did not particularly notice increased urination. I instructed him to stop taking his medications from home while in the hospital. He denies chest pain. He reports what sounds like PND and possibly orthopnea, although description of symptoms is somewhat difficult to interpret.  He is  not using O2 for ambulation at home because the tanks are too heavy.    Past Medical History:  Diagnosis Date   Cardiomyopathy ([place]) 06/2017   EF 40-45%   Chronic renal insufficiency, stage 3 (moderate) (HCC)    Diastolic dysfunction [id number]   grade 2    Hypertension    Hypertensive urgency    Obesity     History reviewed. No pertinent surgical history.   Home Medications:  Prior to Admission medications   Medication Sig Start Date End Date Taking? Authorizing Provider  ADVAIR HFA 115-21 MCG/ACT inhaler INHALE 2  PUFFS INTO THE LUNGS 2 (TWO) TIMES DAILY. 07/23/20   Charlott Rakes, MD  albuterol (VENTOLIN HFA) 108 (90 Base) MCG/ACT inhaler Inhale 2 puffs into the lungs every 4 (four) hours as needed for wheezing or shortness of breath. Patient not taking: Reported on 04/24/2021 02/13/21   Croitoru, Mihai, MD  amLODipine (NORVASC) 10 MG tablet Take 10 mg by mouth daily. 03/10/21   [provider]  apixaban (ELIQUIS) 5 MG TABS tablet Take 1 tablet (5 mg total) by mouth 2 (two) times daily. 01/09/21 04/24/21  Bonnielee Haff, MD  bisoprolol (ZEBETA) 5 MG tablet Take 1 tablet (5 mg total) by mouth daily. 03/16/21   Pokhrel, Corrie Mckusick, MD  EPINEPHrine 0.3 mg/0.3 mL IJ SOAJ injection Inject 0.3 mg into the muscle as needed for anaphylaxis. 01/09/21   Bonnielee Haff, MD  famotidine (PEPCID) 20 MG tablet Take 1 tablet (20 mg total) by mouth 2 (two) times daily. 01/17/21 01/17/22  Julian Hy, DO  furosemide (LASIX) 80 MG tablet Take 1 tablet (80 mg total) by mouth 2 (two) times daily. 04/07/21   Croitoru, Mihai, MD  hydrALAZINE (APRESOLINE) 50 MG tablet Take 0.5 tablets (25 mg total) by mouth 3 (three) times daily. Patient taking differently: Take 50 mg by mouth 3 (three) times daily. 03/09/21 03/09/22  Almyra Deforest, PA  isosorbide dinitrate (ISORDIL) 20 MG tablet TAKE 1 TABLET (20 MG TOTAL) BY MOUTH 3 (THREE) TIMES DAILY. SCHEDULE OFFICE VISIT Patient taking differently: Take 20 mg by mouth 3 (three) times daily. 10/17/20 10/17/21  Margarita Mail, PA-C  metolazone (ZAROXOLYN) 2.5 MG tablet Take 1 tablet (2.5 mg total) by mouth 2 (two) times a week. 04/27/21 07/26/21  Croitoru, Mihai, MD  Multiple Vitamin (MULTIVITAMIN WITH MINERALS) TABS tablet Take 1 tablet by mouth daily. Patient not taking: Reported on 04/24/2021 01/10/21   Bonnielee Haff, MD  polyethylene glycol (MIRALAX / GLYCOLAX) 17 g packet Take 17 g by mouth daily as needed for moderate constipation. Patient not taking: Reported on 04/24/2021 01/09/21   Bonnielee Haff, MD  spironolactone (ALDACTONE) 25 MG tablet Take 1 tablet (25 mg total) by mouth daily. 04/07/21   Croitoru, Mihai, MD  tamsulosin (FLOMAX) 0.4 MG CAPS capsule Take 1 capsule (0.4 mg total) by mouth daily after supper. 03/16/21   Pokhrel, Corrie Mckusick, MD    Inpatient Medications: Scheduled Meds:  Continuous Infusions:  PRN Meds:   Allergies:    Allergies  Allergen Reactions   Ace Inhibitors Anaphylaxis   Black Cherry Fruit Extract Marcelline Mates Extract] Anaphylaxis    Tongue   Grenadine Flavor [Flavoring Agent] Anaphylaxis   Chlorhexidine Other (See Comments)    unknown    Social History:   Social History   Socioeconomic History   Marital status: Single    Spouse name: Not on file   Number of children: Not on file   Years of education: Not on file  Highest education level: Not on file  Occupational History   Not on file  Tobacco Use   Smoking status: Every Day    Packs/day: 1.00    Types: Cigarettes   Smokeless tobacco: Never   Tobacco comments:    1 pack last 2-3 days- states stopped 01/04/21.  Substance and Sexual Activity   Alcohol use: Yes    Alcohol/week: 1.0 standard drink    Types: 1 Shots of liquor per week    Comment: socially   Drug use: Yes    Types: Marijuana    Comment: every other week   Sexual activity: Not on file  Other Topics Concern   Not on file  Social History Narrative   Not on file   Social Determinants of Health   Financial Resource Strain: Not on file  Food Insecurity: Not on file  Transportation Needs: Not on file  Physical Activity: Not on file  Stress: Not on file  Social Connections: Not on file  Intimate Partner Violence: Not on file    Family History:    Family History  Problem Relation Age of Onset   Diabetes Mother    Diabetes Father      ROS:  Please see the history of present illness.   All other ROS reviewed and negative.     Physical Exam/Data:   Vitals:   04/29/21 1410 04/29/21 1415 04/29/21 1614  04/29/21 1658  BP: 106/87 120/89 120/75 (!) 142/89  Pulse: 85 78 78 78  Resp: 15 15 18 18   Temp:   98.1 F (36.7 C) 98.2 F (36.8 C)  TempSrc:   Oral Oral  SpO2: 92% 93% 96% 94%   No intake or output data in the 24 hours ending 04/29/21 1813 Last 3 Weights 04/24/2021 04/07/2021 03/30/2021  Weight (lbs) 347 lb 341 lb 9.6 oz 338 lb 3.2 oz  Weight (kg) 157.398 kg 154.949 kg 153.407 kg     There is no height or weight on file to calculate BMI.  General:  obese male in NAD HEENT: normal Neck: no JVD - exam difficult Vascular: No carotid bruits; Distal pulses 2+ bilaterally Cardiac:  normal S1, S2; RRR; no murmur  Lungs:  respirations  unlabored on 5L, diminished in bases, poor respirations in upper lobes Abd: abdomen very tight, nontender, + BS Ext: 1+ edema just above knees Musculoskeletal:  No deformities, BUE and BLE strength normal and equal Skin: warm and dry  Neuro:  CNs 2-12 intact, no focal abnormalities noted Psych:  Normal affect   EKG:  The EKG was personally reviewed and demonstrates:  sinus rhythm with HR 82 Telemetry:  Telemetry was personally reviewed and demonstrates:  sinus rhythm with HR 80s  Relevant CV Studies:  Echo 01/08/21:  1. Left ventricular ejection fraction, by estimation, is 60 to 65%. The  left ventricle has normal function. The left ventricle has no regional  wall motion abnormalities. There is mild left ventricular hypertrophy.  Left ventricular diastolic parameters  are consistent with Grade I diastolic dysfunction (impaired relaxation).   2. Right ventricular systolic function is normal. The right ventricular  size is normal.   3. The mitral valve is normal in structure. No evidence of mitral valve  regurgitation. No evidence of mitral stenosis.   4. The aortic valve is normal in structure. Aortic valve regurgitation is  not visualized. No aortic stenosis is present.   5. Aortic dilatation noted. There is mild dilatation of the aortic root,   measuring 40 mm.  There is borderline dilatation of the ascending aorta,  measuring 39 mm.   6. The inferior vena cava is normal in size with greater than 50%  respiratory variability, suggesting right atrial pressure of 3 mmHg.   Laboratory Data:  High Sensitivity Troponin:  No results for input(s): TROPONINIHS in the last 720 hours.   Chemistry Recent Labs  Lab 04/29/21 1010  NA 138  K 4.1  CL 91*  CO2 38*  GLUCOSE 132*  BUN 27*  CREATININE 1.78*  CALCIUM 9.5  GFRNONAA 42*  ANIONGAP 9    No results for input(s): PROT, ALBUMIN, AST, ALT, ALKPHOS, BILITOT in the last 168 hours. Lipids No results for input(s): CHOL, TRIG, HDL, LABVLDL, LDLCALC, CHOLHDL in the last 168 hours.  Hematology Recent Labs  Lab 04/29/21 1010  WBC 7.3  RBC 4.73  HGB 14.1  HCT 46.1  MCV 97.5  MCH 29.8  MCHC 30.6  RDW 14.6  PLT 253   Thyroid No results for input(s): TSH, FREET4 in the last 168 hours.  BNP Recent Labs  Lab 04/29/21 1010  BNP 31.0    DDimer No results for input(s): DDIMER in the last 168 hours.   Radiology/Studies:  CT ABDOMEN PELVIS WO CONTRAST  Result Date: 04/29/2021 CLINICAL DATA:  Lymphadenopathy, groin abdominal pain. EXAM: CT ABDOMEN AND PELVIS WITHOUT CONTRAST TECHNIQUE: Multidetector CT imaging of the abdomen and pelvis was performed following the standard protocol without IV contrast. COMPARISON:  None. FINDINGS: Lower chest: Right basilar atelectasis. Hepatobiliary: No focal liver abnormality is seen. No gallstones, gallbladder wall thickening, or biliary dilatation. Pancreas: Unremarkable. No pancreatic ductal dilatation or surrounding inflammatory changes. Spleen: Normal in size without focal abnormality. Adrenals/Urinary Tract: Adrenal glands are unremarkable. Kidneys are normal, without renal calculi, focal lesion, or hydronephrosis. Simple left renal cysts in the upper and lower pole. Bladder is unremarkable. Stomach/Bowel: Stomach is within normal limits.  Appendix appears normal. No evidence of bowel wall thickening, distention, or inflammatory changes. Vascular/Lymphatic: Aortic atherosclerosis. Mildly enlarged bilateral inguinal lymph nodes. No retroperitoneal fibrosis or lymphadenopathy. Reproductive: Prostate is mildly enlarged. Other: There is marked skin thickening of the scrotal sac with bilateral hydroceles. Fat containing umbilical hernia. Musculoskeletal: Mild degenerate disc disease of the lumbar spine. No acute osseous abnormality or suspicious osseous lesion. IMPRESSION: 1. Marked skin thickening of the scrotal sac with bilateral hydroceles concerning for cellulitis. Mildly enlarged bilateral inguinal lymph nodes. No abdominal/pelvic lymphadenopathy. This may be secondary to scrotal infectious/inflammatory process. Clinical correlation is suggested. 2.  No CT evidence of acute abdominal/pelvic process. Electronically Signed   By: Larose Hires D.O.   On: 04/29/2021 16:31   DG Chest 2 View  Result Date: 04/29/2021 CLINICAL DATA:  Shortness of breath. Known congestive heart failure. EXAM: CHEST - 2 VIEW COMPARISON:  03/11/2021 FINDINGS: Both views are degraded by patient size. Low lung volumes, especially on the frontal radiograph. Midline trachea. Mild cardiomegaly. Radiopaque densities projecting over the lower chest bilaterally may be external to the patient. Not localized on the frontal. No pleural effusion or pneumothorax. No congestive failure. Bibasilar atelectasis, greater on the right. IMPRESSION: Cardiomegaly and low lung volumes with bibasilar atelectasis. No acute superimposed process. Decreased sensitivity and specificity exam due to technique related factors, as described above. Electronically Signed   By: Jeronimo Greaves M.D.   On: 04/29/2021 10:55   US SCROTUM W/DOPPLER  Result Date: 04/29/2021 CLINICAL DATA:  A 65 year old male presents for evaluation of scrotal pain. EXAM: SCROTAL ULTRASOUND DOPPLER ULTRASOUND OF THE TESTICLES  TECHNIQUE: Complete ultrasound examination of the testicles, epididymis, and other scrotal structures was performed. Color and spectral Doppler ultrasound were also utilized to evaluate blood flow to the testicles. COMPARISON:  None FINDINGS: Right testicle Measurements: 4.5 x 3.5 x 2.9 cm. No mass or microlithiasis visualized. Left testicle Measurements: 4.2 x 3.2 x 3.2 cm. No mass or microlithiasis visualized. Right epididymis:  Normal in size and appearance. Left epididymis:  Normal in size and appearance. Hydrocele: Bilateral large hydroceles. Small areas of echogenic debris without septation within the RIGHT hydrocele. Varicocele: RIGHT-sided varicocele up to 5 mm. No signs of LEFT varicocele. Pulsed Doppler interrogation of both testes demonstrates normal low resistance arterial and venous waveforms bilaterally. IMPRESSION: No signs of testicular torsion. Scrotal wall thickening and bilateral hydroceles, minimally complex on the RIGHT without signs of septation. Scrotal thickening and hydroceles more likely relate to volume overload and or heart failure/anasarca. Would however correlate with any signs of infection over the scrotum. Potential isolated RIGHT varicocele. Attention on upcoming CT of the abdomen and pelvis for any signs of retroperitoneal mass or similar process that would explain and isolated RIGHT varicocele. Electronically Signed   By: Zetta Bills M.D.   On: 04/29/2021 15:50     Assessment and Plan:   Acute on chronic diastolic heart failure Acute on chronic respiratory failure - suspected COPD - he took 2 metolazone tablets last night without increase in urination -  BNP WNL, but may be falsely low - do not suspect and infectious etiology - suspect he is retaining fluid in his abdomen as his lower extremities are not particularly edematous - will start 80 mg IV lasix BID and monitor strict I&OS - weight not recorded in the ER - please obtain daily weights - discharge weight on  03/16/21 was 145.8 kg - maintained on hydralazine and isordil - hold these with IV diuretic - will add back as pressure allows - will likely need torsemide vs bumex at discharge   PAF Chronic anticoagulation - hold bisoprolol given exacerbation - continue eliquis 5 mg BID - fortunately is in sinus rhythm  - he is unaware of his arrhythmia   Hypertension - hold BB - I instructed him to stop taking his home medications while in the hospital  - given pressure only in the 120s in the ER, will hold hydralazine and nitrate while diuresing   Acute on chronic kidney disease stage III - sCr 1.78, up from 1.19 - repeat BMP tomorrow - do not suspect this is due to low cardiac output   Scrotal swelling - scrotal US with right variocele - per primary   OSA - not compliant on CPAP, he states he is trying to get used to it    Risk Assessment/Risk Scores:    New York Heart Association (NYHA) Functional Class NYHA Class IV        For questions or updates, please contact [facility] Please consult [email or web address] for contact info under    Signed, Ledora Bottcher, [place]  04/29/2021 6:13 PM

## 2021-04-29 NOTE — ED Provider Notes (Signed)
[facility] EMERGENCY DEPARTMENT Provider Note   CSN: [id number] Arrival date & time: 04/29/21  0957     History Chief Complaint  Patient presents with   Shortness of Breath    Zachary Hawkins is a 65 y.o. male.   Shortness of Breath Associated symptoms: no abdominal pain, no chest pain, no cough, no ear pain, no fever, no neck pain, no rash, no sore throat, no vomiting and no wheezing   Patient presents for shortness of breath and groin swelling.  Shortness of breath has been worsening over the past week.  Patient has a history of cardiomyopathy and CHF.  He is currently on 80 mg twice daily of Lasix.  Given his recent worsening of shortness of breath, he was prescribed metolazone.  He was advised to take 1 tablet twice per week.  He took 2 tablets yesterday and 1 tablet today.  In addition to his shortness of breath, patient has had worsening bilateral lower extremity edema.  Over the past 24 hours, he has had a scrotal and penile edema.  Currently, he denies any chest discomfort.  He does feel short of breath at rest.  Shortness of breath is worsened with any exertion.  Currently he has exercise intolerance and is only able to take a few steps at a time.  Patient does live alone.  He has a friend who is at home with him often.  She also accompanies him in the ED.  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  In addition to the metolazone, recent new medications include antibiotics following a dental extraction yesterday.  Patient leaves that these antibiotics as well as his metolazone were started after he started to experience scrotal edema, although is not entirely sure.    Past Medical History:  Diagnosis Date   Cardiomyopathy ([place]) 06/2017   EF 40-45%   Chronic renal insufficiency, stage 3 (moderate) (HCC)    Diastolic dysfunction [id number]   grade 2    Hypertension    Hypertensive urgency     Obesity     Patient Active Problem List   Diagnosis Date Noted   Acute renal failure superimposed on stage 3 chronic kidney disease ([place]) 04/29/2021   Acute exacerbation of CHF (congestive heart failure) ([place]) 04/29/2021   Chronic respiratory failure with hypercapnia ([place]) 04/29/2021   Bilateral hydrocele 04/29/2021   Dyspnea on exertion 04/29/2021   Acute respiratory failure with hypoxia ([place]) 03/10/2021   Urticaria 01/15/2021   Chronic heart failure with preserved ejection fraction ([place])    Angioedema 01/04/2021   Influenza vaccine refused 06/26/2020   COVID-19 vaccine series completed 06/26/2020   Paroxysmal atrial fibrillation ([place]) 07/02/2019   Secondary hypercoagulable state ([place]) 07/02/2019   OSA (obstructive sleep apnea) 04/03/2019   History of angioedema due to ACE INHIBITORS 10/13/2017   Non compliance w medication regimen 07/13/2017   Essential hypertension 06/23/2017   Obesity with serious comorbidity    Cardiomyopathy ([place]) 06/06/2017   Dyspnea 06/05/2017    History reviewed. No pertinent surgical history.     Family History  Problem Relation Age of Onset   Diabetes Mother    Diabetes Father     Social History   Tobacco Use   Smoking status: Every Day    Packs/day: 1.00    Types: Cigarettes   Smokeless tobacco: Never   Tobacco comments:    1 pack last 2-3 days- states stopped 01/04/21.  Substance Use Topics   Alcohol use: Yes    Alcohol/week: 1.0 standard drink    Types: 1 Shots of liquor per week    Comment: socially   Drug use: Yes    Types: Marijuana    Comment: every other week    Home Medications Prior to Admission medications   Medication Sig Start Date End Date Taking? Authorizing Provider  albuterol (VENTOLIN HFA) 108 (90 Base) MCG/ACT inhaler Inhale 2 puffs into the lungs every 4 (four) hours as needed for wheezing or shortness of breath. 02/13/21  Yes Croitoru, Mihai, MD  amLODipine (NORVASC) 10 MG tablet Take 10 mg by mouth daily.  03/10/21  Yes [provider]  amoxicillin (AMOXIL) 500 MG capsule Take 500 mg by mouth every 8 (eight) hours. 04/27/21  Yes [provider]  apixaban (ELIQUIS) 5 MG TABS tablet Take 1 tablet (5 mg total) by mouth 2 (two) times daily. 01/09/21 04/29/21 Yes Bonnielee Haff, MD  atorvastatin (LIPITOR) 10 MG tablet Take 10 mg by mouth daily.   Yes [provider]  bisoprolol (ZEBETA) 5 MG tablet Take 1 tablet (5 mg total) by mouth daily. 03/16/21  Yes Pokhrel, Laxman, MD  EPINEPHrine 0.3 mg/0.3 mL IJ SOAJ injection Inject 0.3 mg into the muscle as needed for anaphylaxis. Patient taking differently: Inject 0.3 mg into the muscle once as needed for anaphylaxis. 01/09/21  Yes Bonnielee Haff, MD  famotidine (PEPCID) 20 MG tablet Take 1 tablet (20 mg total) by mouth 2 (two) times daily. 01/17/21 01/17/22 Yes Noemi Chapel P, DO  furosemide (LASIX) 80 MG tablet Take 1 tablet (80 mg total) by mouth 2 (two) times daily. 04/07/21  Yes Croitoru, Mihai, MD  hydrALAZINE (APRESOLINE) 25 MG tablet Take 25 mg by mouth 3 (three) times daily.   Yes [provider]  ibuprofen (ADVIL) 600 MG tablet Take 600 mg by mouth every 6 (six) hours as needed for moderate pain. 04/27/21  Yes [provider]  isosorbide dinitrate (ISORDIL) 20 MG tablet TAKE 1 TABLET (20 MG TOTAL) BY MOUTH 3 (THREE) TIMES DAILY. SCHEDULE OFFICE VISIT Patient taking differently: Take 20 mg by mouth 3 (three) times daily. 10/17/20 10/17/21 Yes Harris, Abigail, PA-C  metolazone (ZAROXOLYN) 2.5 MG tablet Take 1 tablet (2.5 mg total) by mouth 2 (two) times a week. 04/27/21 07/26/21 Yes Croitoru, Mihai, MD  polyethylene glycol (MIRALAX / GLYCOLAX) 17 g packet Take 17 g by mouth daily as needed for moderate constipation. 01/09/21  Yes Bonnielee Haff, MD  spironolactone (ALDACTONE) 25 MG tablet Take 1 tablet (25 mg total) by mouth daily. 04/07/21  Yes Croitoru, Mihai, MD  tamsulosin (FLOMAX) 0.4 MG CAPS capsule Take 1 capsule  (0.4 mg total) by mouth daily after supper. 03/16/21  Yes Pokhrel, Corrie Mckusick, MD  ADVAIR HFA 115-21 MCG/ACT inhaler INHALE 2 PUFFS INTO THE LUNGS 2 (TWO) TIMES DAILY. Patient not taking: Reported on 04/29/2021 07/23/20   Charlott Rakes, MD  hydrALAZINE (APRESOLINE) 50 MG tablet Take 0.5 tablets (25 mg total) by mouth 3 (three) times daily. Patient not taking: Reported on 04/29/2021 03/09/21 03/09/22  Almyra Deforest, PA  Multiple Vitamin (MULTIVITAMIN WITH MINERALS) TABS tablet Take 1 tablet by mouth daily. Patient not taking: Reported on 04/24/2021 01/10/21   Bonnielee Haff, MD    Allergies    Ace inhibitors, Black cherry fruit extract Marcelline Mates extract], Grenadine flavor [flavoring agent], and Chlorhexidine  Review of Systems   Review of Systems  Constitutional:  Positive for fatigue. Negative for chills and fever.  HENT:  Negative for congestion, ear pain and sore throat.   Eyes:  Negative for pain and visual disturbance.  Respiratory:  Positive for shortness of breath. Negative for cough, chest tightness and wheezing.   Cardiovascular:  Positive for leg swelling. Negative for chest pain and palpitations.  Gastrointestinal:  Negative for abdominal pain, diarrhea, nausea and vomiting.  Genitourinary:  Positive for decreased urine volume, penile swelling and scrotal swelling. Negative for dysuria, flank pain, frequency, hematuria and penile discharge.  Musculoskeletal:  Negative for arthralgias, back pain, joint swelling, myalgias and neck pain.  Skin:  Negative for color change and rash.  Neurological:  Negative for dizziness, seizures, syncope, weakness, light-headedness and numbness.  Psychiatric/Behavioral:  Negative for confusion and decreased concentration.   All other systems reviewed and are negative.  Physical Exam Updated Vital Signs BP 122/84 (BP Location: Left Arm)    Pulse 84    Temp 98.6 F (37 C) (Oral)    Resp 17    SpO2 95%   Physical Exam Vitals and nursing note reviewed. Exam  conducted with a chaperone present.  Constitutional:      General: He is not in acute distress.    Appearance: He is well-developed. He is obese. He is not ill-appearing, toxic-appearing or diaphoretic.  HENT:     Head: Normocephalic and atraumatic.  Eyes:     Extraocular Movements: Extraocular movements intact.     Conjunctiva/sclera: Conjunctivae normal.  Cardiovascular:     Rate and Rhythm: Normal rate and regular rhythm.     Heart sounds: No murmur heard. Pulmonary:     Effort: Tachypnea present. No respiratory distress.     Breath sounds: No wheezing or rhonchi.     Comments: Increased work of breathing Chest:     Chest wall: No tenderness.  Abdominal:     Palpations: Abdomen is soft.     Tenderness: There is no abdominal tenderness.  Genitourinary:    Comments: Significant scrotal and penile edema Musculoskeletal:        General: No swelling.     Cervical back: Normal range of motion and neck supple.     Right lower leg: Edema present.     Left lower leg: Edema present.  Skin:    General: Skin is warm and dry.  Neurological:     General: No focal deficit present.     Mental Status: He is alert and oriented to person, place, and time.     Cranial Nerves: No cranial nerve deficit.     Motor: No weakness.  Psychiatric:        Mood and Affect: Mood normal.        Behavior: Behavior normal.    ED Results / Procedures / Treatments   Labs (all labs ordered are listed, but only abnormal results are displayed) Labs Reviewed  BASIC METABOLIC PANEL - Abnormal; Notable for the following components:      Result Value   Chloride 91 (*)    CO2 38 (*)    Glucose, Bld 132 (*)    BUN 27 (*)    Creatinine, Ser 1.78 (*)    GFR, Estimated 42 (*)    All other components within normal limits  URINALYSIS, ROUTINE W REFLEX MICROSCOPIC - Abnormal; Notable for the following components:   Color, Urine STRAW (*)    Hgb urine dipstick SMALL (*)    All other components within normal  limits  RESP PANEL BY RT-PCR (FLU A&B, COVID) ARPGX2  MRSA NEXT  GEN BY PCR, NASAL  CBC  BRAIN NATRIURETIC PEPTIDE  RAPID URINE DRUG SCREEN, HOSP PERFORMED  TSH  BASIC METABOLIC PANEL  CBC  MAGNESIUM  CBG MONITORING, ED  URINE CYTOLOGY ANCILLARY ONLY  TROPONIN I (HIGH SENSITIVITY)  TROPONIN I (HIGH SENSITIVITY)    EKG EKG Interpretation  Date/Time:  Wednesday April 29 2021 10:10:41 EST Ventricular Rate:  82 PR Interval:  162 QRS Duration: 90 QT Interval:  342 QTC Calculation: 399 R Axis:   18 Text Interpretation: Normal sinus rhythm Normal ECG Confirmed by Godfrey Pick [phone]) on 04/29/2021 12:15:36 PM  Radiology CT ABDOMEN PELVIS WO CONTRAST  Result Date: 04/29/2021 CLINICAL DATA:  Lymphadenopathy, groin abdominal pain. EXAM: CT ABDOMEN AND PELVIS WITHOUT CONTRAST TECHNIQUE: Multidetector CT imaging of the abdomen and pelvis was performed following the standard protocol without IV contrast. COMPARISON:  None. FINDINGS: Lower chest: Right basilar atelectasis. Hepatobiliary: No focal liver abnormality is seen. No gallstones, gallbladder wall thickening, or biliary dilatation. Pancreas: Unremarkable. No pancreatic ductal dilatation or surrounding inflammatory changes. Spleen: Normal in size without focal abnormality. Adrenals/Urinary Tract: Adrenal glands are unremarkable. Kidneys are normal, without renal calculi, focal lesion, or hydronephrosis. Simple left renal cysts in the upper and lower pole. Bladder is unremarkable. Stomach/Bowel: Stomach is within normal limits. Appendix appears normal. No evidence of bowel wall thickening, distention, or inflammatory changes. Vascular/Lymphatic: Aortic atherosclerosis. Mildly enlarged bilateral inguinal lymph nodes. No retroperitoneal fibrosis or lymphadenopathy. Reproductive: Prostate is mildly enlarged. Other: There is marked skin thickening of the scrotal sac with bilateral hydroceles. Fat containing umbilical hernia. Musculoskeletal: Mild  degenerate disc disease of the lumbar spine. No acute osseous abnormality or suspicious osseous lesion. IMPRESSION: 1. Marked skin thickening of the scrotal sac with bilateral hydroceles concerning for cellulitis. Mildly enlarged bilateral inguinal lymph nodes. No abdominal/pelvic lymphadenopathy. This may be secondary to scrotal infectious/inflammatory process. Clinical correlation is suggested. 2.  No CT evidence of acute abdominal/pelvic process. Electronically Signed   By: Keane Police D.O.   On: 04/29/2021 16:31   DG Chest 2 View  Result Date: 04/29/2021 CLINICAL DATA:  Shortness of breath. Known congestive heart failure. EXAM: CHEST - 2 VIEW COMPARISON:  03/11/2021 FINDINGS: Both views are degraded by patient size. Low lung volumes, especially on the frontal radiograph. Midline trachea. Mild cardiomegaly. Radiopaque densities projecting over the lower chest bilaterally may be external to the patient. Not localized on the frontal. No pleural effusion or pneumothorax. No congestive failure. Bibasilar atelectasis, greater on the right. IMPRESSION: Cardiomegaly and low lung volumes with bibasilar atelectasis. No acute superimposed process. Decreased sensitivity and specificity exam due to technique related factors, as described above. Electronically Signed   By: Abigail Miyamoto M.D.   On: 04/29/2021 10:55   US SCROTUM W/DOPPLER  Result Date: 04/29/2021 CLINICAL DATA:  A 65 year old male presents for evaluation of scrotal pain. EXAM: SCROTAL ULTRASOUND DOPPLER ULTRASOUND OF THE TESTICLES TECHNIQUE: Complete ultrasound examination of the testicles, epididymis, and other scrotal structures was performed. Color and spectral Doppler ultrasound were also utilized to evaluate blood flow to the testicles. COMPARISON:  None FINDINGS: Right testicle Measurements: 4.5 x 3.5 x 2.9 cm. No mass or microlithiasis visualized. Left testicle Measurements: 4.2 x 3.2 x 3.2 cm. No mass or microlithiasis visualized. Right  epididymis:  Normal in size and appearance. Left epididymis:  Normal in size and appearance. Hydrocele: Bilateral large hydroceles. Small areas of echogenic debris without septation within the RIGHT hydrocele. Varicocele: RIGHT-sided varicocele up to 5 mm. No signs of LEFT varicocele.  Pulsed Doppler interrogation of both testes demonstrates normal low resistance arterial and venous waveforms bilaterally. IMPRESSION: No signs of testicular torsion. Scrotal wall thickening and bilateral hydroceles, minimally complex on the RIGHT without signs of septation. Scrotal thickening and hydroceles more likely relate to volume overload and or heart failure/anasarca. Would however correlate with any signs of infection over the scrotum. Potential isolated RIGHT varicocele. Attention on upcoming CT of the abdomen and pelvis for any signs of retroperitoneal mass or similar process that would explain and isolated RIGHT varicocele. Electronically Signed   By: Zetta Bills M.D.   On: 04/29/2021 15:50    Procedures Procedures   Medications Ordered in ED Medications  sodium chloride flush (NS) 0.9 % injection 3 mL (has no administration in time range)  sodium chloride flush (NS) 0.9 % injection 3 mL (has no administration in time range)  0.9 %  sodium chloride infusion (has no administration in time range)  acetaminophen (TYLENOL) tablet 650 mg (has no administration in time range)    Or  acetaminophen (TYLENOL) suppository 650 mg (has no administration in time range)  HYDROcodone-acetaminophen (NORCO/VICODIN) 5-325 MG per tablet 1-2 tablet (has no administration in time range)  furosemide (LASIX) injection 80 mg (80 mg Intravenous Given 04/29/21 1858)  sodium chloride flush (NS) 0.9 % injection 3 mL (has no administration in time range)  sodium chloride flush (NS) 0.9 % injection 3 mL (has no administration in time range)  0.9 %  sodium chloride infusion (has no administration in time range)    ED Course  I have  reviewed the triage vital signs and the nursing notes.  Pertinent labs & imaging results that were available during my care of the patient were reviewed by me and considered in my medical decision making (see chart for details).    MDM Rules/Calculators/A&P                         Patient is a very pleasant 65 year old male with history of CHF, who presents for worsening shortness of breath over the past week in addition to scrotal and penile edema over the past 24 hours.  Vital signs are normal upon arrival.  Patient is on his home dose of 4 L of supplemental oxygen.  He does have increased work of breathing.  Lung auscultation is limited by habitus.  BLE edema is present.  Additionally, he has significant scrotal and penile edema.  This appears to be limited to his genital region and does not involve his thigh areas.  Lab work shows a normal BNP.  Interestingly, patient has been diagnosed with C1 esterase deficiency in the past.  Scrotal swelling could be an atypical presentation of angioedema.  Patient denies any throat or tongue swelling.  Lab work is notable for AKI. Patient will require diuresis and this will be made challenging by his already high doses of Lasix in the setting of an AKI. Ultrasound of scrotum and CT scan of abdomen pelvis were ordered to further characterize areas of swelling. Patient was admitted to hospitalist for further management.    Final Clinical Impression(s) / ED Diagnoses Final diagnoses:  Scrotal pain  Scrotal swelling  SOB (shortness of breath)  Exercise intolerance  Penile swelling    Rx / DC Orders ED Discharge Orders     None        Godfrey Pick, MD 04/29/21 2013

## 2021-04-29 NOTE — Telephone Encounter (Signed)
Levonne Lapping, NP, from [facility] calling back to give an update on the patient. She says he did not go to the ER last night. She says he came to their office this morning and had a 20 lbs+ weight gain and continued scrotal edema. She says his oxygen was 88%. She states the patient was sent to the ER. She says she does not need a call back.

## 2021-04-29 NOTE — ED Notes (Signed)
PT to CT.

## 2021-04-30 ENCOUNTER — Observation Stay (HOSPITAL_COMMUNITY): Payer: Medicaid Other

## 2021-04-30 DIAGNOSIS — Z7901 Long term (current) use of anticoagulants: Secondary | ICD-10-CM | POA: Diagnosis not present

## 2021-04-30 DIAGNOSIS — J9612 Chronic respiratory failure with hypercapnia: Secondary | ICD-10-CM | POA: Diagnosis present

## 2021-04-30 DIAGNOSIS — J9611 Chronic respiratory failure with hypoxia: Secondary | ICD-10-CM | POA: Diagnosis present

## 2021-04-30 DIAGNOSIS — I5033 Acute on chronic diastolic (congestive) heart failure: Secondary | ICD-10-CM | POA: Diagnosis present

## 2021-04-30 DIAGNOSIS — E785 Hyperlipidemia, unspecified: Secondary | ICD-10-CM | POA: Diagnosis present

## 2021-04-30 DIAGNOSIS — E876 Hypokalemia: Secondary | ICD-10-CM | POA: Diagnosis not present

## 2021-04-30 DIAGNOSIS — Z20822 Contact with and (suspected) exposure to covid-19: Secondary | ICD-10-CM | POA: Diagnosis present

## 2021-04-30 DIAGNOSIS — I5031 Acute diastolic (congestive) heart failure: Secondary | ICD-10-CM

## 2021-04-30 DIAGNOSIS — Z9981 Dependence on supplemental oxygen: Secondary | ICD-10-CM | POA: Diagnosis not present

## 2021-04-30 DIAGNOSIS — J449 Chronic obstructive pulmonary disease, unspecified: Secondary | ICD-10-CM | POA: Diagnosis present

## 2021-04-30 DIAGNOSIS — N433 Hydrocele, unspecified: Secondary | ICD-10-CM | POA: Diagnosis present

## 2021-04-30 DIAGNOSIS — Z9114 Patient's other noncompliance with medication regimen: Secondary | ICD-10-CM | POA: Diagnosis not present

## 2021-04-30 DIAGNOSIS — N400 Enlarged prostate without lower urinary tract symptoms: Secondary | ICD-10-CM | POA: Diagnosis present

## 2021-04-30 DIAGNOSIS — G4733 Obstructive sleep apnea (adult) (pediatric): Secondary | ICD-10-CM | POA: Diagnosis present

## 2021-04-30 DIAGNOSIS — Z87891 Personal history of nicotine dependence: Secondary | ICD-10-CM | POA: Diagnosis not present

## 2021-04-30 DIAGNOSIS — N5082 Scrotal pain: Secondary | ICD-10-CM | POA: Diagnosis present

## 2021-04-30 DIAGNOSIS — K047 Periapical abscess without sinus: Secondary | ICD-10-CM | POA: Diagnosis present

## 2021-04-30 DIAGNOSIS — I130 Hypertensive heart and chronic kidney disease with heart failure and stage 1 through stage 4 chronic kidney disease, or unspecified chronic kidney disease: Secondary | ICD-10-CM | POA: Diagnosis present

## 2021-04-30 DIAGNOSIS — E873 Alkalosis: Secondary | ICD-10-CM | POA: Diagnosis present

## 2021-04-30 DIAGNOSIS — Z888 Allergy status to other drugs, medicaments and biological substances status: Secondary | ICD-10-CM | POA: Diagnosis not present

## 2021-04-30 DIAGNOSIS — N1831 Chronic kidney disease, stage 3a: Secondary | ICD-10-CM | POA: Diagnosis present

## 2021-04-30 DIAGNOSIS — Z79899 Other long term (current) drug therapy: Secondary | ICD-10-CM | POA: Diagnosis not present

## 2021-04-30 DIAGNOSIS — N179 Acute kidney failure, unspecified: Secondary | ICD-10-CM | POA: Diagnosis present

## 2021-04-30 DIAGNOSIS — Z6841 Body Mass Index (BMI) 40.0 and over, adult: Secondary | ICD-10-CM | POA: Diagnosis not present

## 2021-04-30 DIAGNOSIS — I480 Paroxysmal atrial fibrillation: Secondary | ICD-10-CM | POA: Diagnosis present

## 2021-04-30 DIAGNOSIS — I10 Essential (primary) hypertension: Secondary | ICD-10-CM | POA: Diagnosis not present

## 2021-04-30 LAB — ECHOCARDIOGRAM COMPLETE
Area-P 1/2: 3.42 cm2
S' Lateral: 3.4 cm
Weight: 5315.2 oz

## 2021-04-30 LAB — BASIC METABOLIC PANEL
Anion gap: 11 (ref 5–15)
BUN: 36 mg/dL — ABNORMAL HIGH (ref 8–23)
CO2: 39 mmol/L — ABNORMAL HIGH (ref 22–32)
Calcium: 9.2 mg/dL (ref 8.9–10.3)
Chloride: 86 mmol/L — ABNORMAL LOW (ref 98–111)
Creatinine, Ser: 2.21 mg/dL — ABNORMAL HIGH (ref 0.61–1.24)
GFR, Estimated: 33 mL/min — ABNORMAL LOW (ref >60)
Glucose, Bld: 144 mg/dL — ABNORMAL HIGH (ref 70–99)
Potassium: 4.2 mmol/L (ref 3.5–5.1)
Sodium: 136 mmol/L (ref 135–145)

## 2021-04-30 LAB — CBC
HCT: 46.1 % (ref 39.0–52.0)
Hemoglobin: 14.1 g/dL (ref 13.0–17.0)
MCH: 29.6 pg (ref 26.0–34.0)
MCHC: 30.6 g/dL (ref 30.0–36.0)
MCV: 96.6 fL (ref 80.0–100.0)
Platelets: 243 10*3/uL (ref 150–400)
RBC: 4.77 MIL/uL (ref 4.22–5.81)
RDW: 14.6 % (ref 11.5–15.5)
WBC: 7.7 10*3/uL (ref 4.0–10.5)
nRBC: 0 % (ref 0.0–0.2)

## 2021-04-30 LAB — MRSA NEXT GEN BY PCR, NASAL: MRSA by PCR Next Gen: DETECTED — AB

## 2021-04-30 MED ORDER — AMLODIPINE BESYLATE 5 MG PO TABS
5.0000 mg | ORAL_TABLET | Freq: Every day | ORAL | Status: DC
  Administered 2021-05-01 – 2021-05-04 (×4): 5 mg via ORAL
  Filled 2021-04-30 (×4): qty 1

## 2021-04-30 MED ORDER — MUPIROCIN 2 % EX OINT
1.0000 "application " | TOPICAL_OINTMENT | Freq: Two times a day (BID) | CUTANEOUS | Status: DC
  Administered 2021-04-30 – 2021-05-04 (×8): 1 via NASAL
  Filled 2021-04-30: qty 22

## 2021-04-30 MED ORDER — PERFLUTREN LIPID MICROSPHERE
1.0000 mL | INTRAVENOUS | Status: AC | PRN
Start: 2021-04-30 — End: 2021-04-30
  Administered 2021-04-30: 09:00:00 4 mL via INTRAVENOUS
  Filled 2021-04-30: qty 10

## 2021-04-30 NOTE — Discharge Instructions (Signed)

## 2021-04-30 NOTE — Progress Notes (Signed)
Progress Note  Patient Name: Zachary Hawkins Date of Encounter: 04/30/2021  Primary Cardiologist:   Thurmon Fair, MD   Subjective   He is somnolent.  No pain.   Inpatient Medications    Scheduled Meds:  amLODipine  10 mg Oral Daily   amoxicillin  500 mg Oral Q8H   apixaban  5 mg Oral BID   atorvastatin  10 mg Oral Daily   famotidine  20 mg Oral BID   fluticasone furoate-vilanterol  1 puff Inhalation Daily   furosemide  80 mg Intravenous BID   isosorbide dinitrate  20 mg Oral TID   mupirocin ointment  1 application Nasal BID   sodium chloride flush  3 mL Intravenous Q12H   sodium chloride flush  3 mL Intravenous Q12H   tamsulosin  0.4 mg Oral QPC supper   Continuous Infusions:  sodium chloride     sodium chloride     PRN Meds: sodium chloride, sodium chloride, acetaminophen **OR** acetaminophen, albuterol, HYDROcodone-acetaminophen, sodium chloride flush, sodium chloride flush   Vital Signs    Vitals:   04/30/21 0045 04/30/21 0300 04/30/21 0405 04/30/21 0753  BP: 121/81  (!) 140/110 106/84  Pulse: 80  84 86  Resp: 18  18 20   Temp: 98.4 F (36.9 C)  98.4 F (36.9 C) 98.4 F (36.9 C)  TempSrc: Oral  Oral Oral  SpO2: 97%  100% 91%  Weight:  (!) 150.7 kg      Intake/Output Summary (Last 24 hours) at 04/30/2021 0813 Last data filed at 04/30/2021 0700 Gross per 24 hour  Intake 360 ml  Output 1950 ml  Net -1590 ml   Filed Weights   04/30/21 0300  Weight: (!) 150.7 kg    Telemetry    NSR - Personally Reviewed  ECG    NA - Personally Reviewed  Physical Exam   GEN: No acute distress.   Neck: No  JVD Cardiac: RRR, no murmurs, rubs, or gallops.  Respiratory:    Decreased breath sounds but no crackles.  GI: Soft, nontender, non-distended  MS:   Mild edema; No deformity. Neuro:  Nonfocal  Psych: Normal affect    Labs    Chemistry Recent Labs  Lab 04/29/21 1010 04/30/21 0206  NA 138 136  K 4.1 4.2  CL 91* 86*  CO2 38* 39*  GLUCOSE  132* 144*  BUN 27* 36*  CREATININE 1.78* 2.21*  CALCIUM 9.5 9.2  GFRNONAA 42* 33*  ANIONGAP 9 11     Hematology Recent Labs  Lab 04/29/21 1010 04/30/21 0206  WBC 7.3 7.7  RBC 4.73 4.77  HGB 14.1 14.1  HCT 46.1 46.1  MCV 97.5 96.6  MCH 29.8 29.6  MCHC 30.6 30.6  RDW 14.6 14.6  PLT 253 243    Cardiac EnzymesNo results for input(s): TROPONINI in the last 168 hours. No results for input(s): TROPIPOC in the last 168 hours.   BNP Recent Labs  Lab 04/29/21 1010  BNP 31.0     DDimer No results for input(s): DDIMER in the last 168 hours.   Radiology    CT ABDOMEN PELVIS WO CONTRAST  Result Date: 04/29/2021 CLINICAL DATA:  Lymphadenopathy, groin abdominal pain. EXAM: CT ABDOMEN AND PELVIS WITHOUT CONTRAST TECHNIQUE: Multidetector CT imaging of the abdomen and pelvis was performed following the standard protocol without IV contrast. COMPARISON:  None. FINDINGS: Lower chest: Right basilar atelectasis. Hepatobiliary: No focal liver abnormality is seen. No gallstones, gallbladder wall thickening, or biliary dilatation. Pancreas: Unremarkable. No  pancreatic ductal dilatation or surrounding inflammatory changes. Spleen: Normal in size without focal abnormality. Adrenals/Urinary Tract: Adrenal glands are unremarkable. Kidneys are normal, without renal calculi, focal lesion, or hydronephrosis. Simple left renal cysts in the upper and lower pole. Bladder is unremarkable. Stomach/Bowel: Stomach is within normal limits. Appendix appears normal. No evidence of bowel wall thickening, distention, or inflammatory changes. Vascular/Lymphatic: Aortic atherosclerosis. Mildly enlarged bilateral inguinal lymph nodes. No retroperitoneal fibrosis or lymphadenopathy. Reproductive: Prostate is mildly enlarged. Other: There is marked skin thickening of the scrotal sac with bilateral hydroceles. Fat containing umbilical hernia. Musculoskeletal: Mild degenerate disc disease of the lumbar spine. No acute osseous  abnormality or suspicious osseous lesion. IMPRESSION: 1. Marked skin thickening of the scrotal sac with bilateral hydroceles concerning for cellulitis. Mildly enlarged bilateral inguinal lymph nodes. No abdominal/pelvic lymphadenopathy. This may be secondary to scrotal infectious/inflammatory process. Clinical correlation is suggested. 2.  No CT evidence of acute abdominal/pelvic process. Electronically Signed   By: Keane Police D.O.   On: 04/29/2021 16:31   DG Chest 2 View  Result Date: 04/29/2021 CLINICAL DATA:  Shortness of breath. Known congestive heart failure. EXAM: CHEST - 2 VIEW COMPARISON:  03/11/2021 FINDINGS: Both views are degraded by patient size. Low lung volumes, especially on the frontal radiograph. Midline trachea. Mild cardiomegaly. Radiopaque densities projecting over the lower chest bilaterally may be external to the patient. Not localized on the frontal. No pleural effusion or pneumothorax. No congestive failure. Bibasilar atelectasis, greater on the right. IMPRESSION: Cardiomegaly and low lung volumes with bibasilar atelectasis. No acute superimposed process. Decreased sensitivity and specificity exam due to technique related factors, as described above. Electronically Signed   By: Abigail Miyamoto M.D.   On: 04/29/2021 10:55   US SCROTUM W/DOPPLER  Result Date: 04/29/2021 CLINICAL DATA:  A 65 year old male presents for evaluation of scrotal pain. EXAM: SCROTAL ULTRASOUND DOPPLER ULTRASOUND OF THE TESTICLES TECHNIQUE: Complete ultrasound examination of the testicles, epididymis, and other scrotal structures was performed. Color and spectral Doppler ultrasound were also utilized to evaluate blood flow to the testicles. COMPARISON:  None FINDINGS: Right testicle Measurements: 4.5 x 3.5 x 2.9 cm. No mass or microlithiasis visualized. Left testicle Measurements: 4.2 x 3.2 x 3.2 cm. No mass or microlithiasis visualized. Right epididymis:  Normal in size and appearance. Left epididymis:  Normal  in size and appearance. Hydrocele: Bilateral large hydroceles. Small areas of echogenic debris without septation within the RIGHT hydrocele. Varicocele: RIGHT-sided varicocele up to 5 mm. No signs of LEFT varicocele. Pulsed Doppler interrogation of both testes demonstrates normal low resistance arterial and venous waveforms bilaterally. IMPRESSION: No signs of testicular torsion. Scrotal wall thickening and bilateral hydroceles, minimally complex on the RIGHT without signs of septation. Scrotal thickening and hydroceles more likely relate to volume overload and or heart failure/anasarca. Would however correlate with any signs of infection over the scrotum. Potential isolated RIGHT varicocele. Attention on upcoming CT of the abdomen and pelvis for any signs of retroperitoneal mass or similar process that would explain and isolated RIGHT varicocele. Electronically Signed   By: Zetta Bills M.D.   On: 04/29/2021 15:50    Cardiac Studies   ECHO :  Pending    Patient Profile     65 y.o. 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 Rogers Blocker.  Assessment & Plan    Acute on chronic diastolic heart failure Acute on chronic respiratory failure Despite the increased creat today I would continue the IV diuresis as ordered. I have asked him to raise his legs and I will put on compression stockings.  Hopefully he will start to turn the corner with diuresis without further bump in creat.    PAF Chronic anticoagulation NSR.  Continue anticoagulation   Hypertension I am going to reduce the Norvasc to give [place] a little more renal perfusion pressure.     Acute on chronic kidney disease stage III Net negative 1600 cc.  Creat is up.  See above.   For questions or  updates, please contact [facility] Please consult [email or web address] for contact info under Cardiology/STEMI.   Signed, Minus Breeding, MD  04/30/2021, 8:13 AM

## 2021-04-30 NOTE — Progress Notes (Signed)
°  Echocardiogram 2D Echocardiogram has been performed.  Zachary Hawkins 04/30/2021, 9:26 AM

## 2021-04-30 NOTE — Progress Notes (Signed)
PROGRESS NOTE    Zachary TIBBETTS  [id number] DOB: Dec 26, 1955 DOA: 04/29/2021 PCP: Pcp, No   Brief Narrative:  HPI: Zachary Hawkins is a 65 y.o. male with medical history significant of CKD stage III, diastolic and systolic CHF, HTN, atrial fibrillation on eliquis, OSA on CPAP at night (doesn't wear nightly), ACE-I angioedema (10/2017), undocumented COPD, chronic respiratory failure on 4-6L [place] after recent hospital discharge in November of this year who presented to ED with complaints of shortness of breath and swollen testicles that started about 2-3 days ago.  He states his "testicles are as big as house and hard."  He doesn't really endorse pain or redness. They are not tender to touch. He has no pain with urination. Denies any dysuria. He is sexually active, no pain with sex.    He also talked to his cardiologist yesterday who advised he come to hospital, but he didn't want to come. He has increased shortness of breath with exertion. He denies any coughing, but has increased leg swelling. He has worsening orthopnea.    He saw Dr. Sallyanne Kuster 04/24/21 and metolazone was added on in the AM before his furosemide twice weekly. He has also been taking ibuprofen for his tooth that was pulled. He has not been compliant with medication and never even picked up the metolazone per cardiology note. He also doesn't wear his oxygen like he is supposed to at home and states when his oxygen was in the 80s today he had taken it off to go to the bathroom and left it off for a bit.    He had recent admit in 03/10/21-03/16/2021 for acute on chronic respiratory failure with pulmonary edema, hx of OSA not treated and suspected COPD with acute exacerbation. He required intubation on 03/11/21 and extubated on 03/12/21. He was discharged on 6L [place] and advised to f/u with pulm which he has done, but has never completed his PFTs. He also was diuresed and weight at time of discharge was 322 pounds. Weight 5 days ago was 345  pounds.    He denies any fever/chills, no headaches/vision changes, chest pain/palpitations, stomach pain, N/V/D, dysuria. He has to sometimes lift his scrotum up to urinate.      ED Course: vitals: afebrile, bp: 130/72, HR: 78, RR: 18, oxygen: 99% on 3L [place] Pertinent labs: CO2: 38, BUN: 27, creatinine: 1.78 (1.2-1.5), BNP: 31 CXR: cardiomegaly and low lung volumes with bibasilar atelectasis. No acute process.   Assessment & Plan:   Principal Problem:   Acute exacerbation of CHF (congestive heart failure) (HCC) Active Problems:   Essential hypertension   OSA (obstructive sleep apnea)   Paroxysmal atrial fibrillation (HCC)   Acute renal failure superimposed on stage 3 chronic kidney disease (HCC)   Chronic respiratory failure with hypercapnia (HCC)   Bilateral hydrocele   Dyspnea on exertion   Acute on chronic diastolic (congestive) heart failure (HCC)  Chronic respiratory failure with hypoxia/acute on chronic diastolic congestive heart failure: Presented with 20+ pound weight gain with increased LE swelling and scrotal swelling. Dry weight around 322 (was 322 on hospital d/c 03/16/21. 345lbs on the day of admission) Last echo in 01/2021 showed EF to 60-65% and grade 1 diastolic dysfunction.  Repeat echo done, results pending.  Please note that patient uses 5 to 6 L of oxygen at home and he is currently at his baseline, he does not have acute on chronic hypoxic respiratory failure as mentioned in cardiology note.  We will continue IV diuresis  with strict I's and O's and daily weight and defer to cardiology for management of CHF.  AKI on CKD stage IIIa: Baseline creatinine anywhere between 1.1-1.4.  Presented with 1.78 and now up again to 2.21, likely cardiorenal/poor perfusion to kidneys.  Noncompliant with medications.  Never picked up metolazone.  Has also been taking NSAIDs after having his tooth pulled.  Continue to avoid nephrotoxic agents and watch closely while he is on IV  diuretics.  Bilateral hydroceles: Likely due to volume overload. Physical exam not consistent with cellulitis, no abx at this time. isolated right varicocele on scrotal US.   Paroxysmal atrial fibrillation (HCC) In NSR, rate controlled, continue telemetry  CHaDSVasc score of 2, continue his eliquis for [facility] Continue to hold bisprolol in setting of acute on chronic CHF    Essential hypertension: Very well controlled.  Continue Isordil and amlodipine but hold Zebeta and home dose of hydralazine while he is on IV diuretics.  OSA (obstructive sleep apnea): not very compliant, wearing about 2-3 hours at a time.  Nightly CPAP.   Undocumented COPD: saw pulmonology 1x and has never followed up for PFTs.  Appears to be stable.   Tooth abscess with recent extraction  Continue amoxicillin   HLD: Continue atorvastatin.  BPH: Continue Flomax  DVT prophylaxis:    Code Status: Full Code  Family Communication:  None present at bedside.  Plan of care discussed with patient in length and he verbalized understanding and agreed with it.  Status is: Inpatient  Remains inpatient appropriate because: Needs IV diuresis.  Estimated body mass index is 43.83 kg/m as calculated from the following:   Height as of 04/24/21: 6\' 1"  (1.854 m).   Weight as of this encounter: 150.7 kg.   Nutritional Assessment: Body mass index is 43.83 kg/m. Seen by dietician.  I agree with the assessment and plan as outlined below: Nutrition Status:   Skin Assessment: I have examined the patient's skin and I agree with the wound assessment as performed by the wound care RN as outlined below:    Consultants:  Cardiology  Procedures:  None  Antimicrobials:  Anti-infectives (From admission, onward)    Start     Dose/Rate Route Frequency Ordered Stop   04/29/21 2200  amoxicillin (AMOXIL) capsule 500 mg        500 mg Oral Every 8 hours 04/29/21 2028            Subjective: Seen and examined.  He has no  complaints.  He is only worried about his scrotal swelling.  Objective: Vitals:   04/30/21 0753 04/30/21 0940 04/30/21 1042 04/30/21 1139  BP: 106/84 126/76  (!) 125/92  Pulse: 86   69  Resp: 20   16  Temp: 98.4 F (36.9 C)   98.5 F (36.9 C)  TempSrc: Oral   Oral  SpO2: 91%  93% 94%  Weight:        Intake/Output Summary (Last 24 hours) at 04/30/2021 1153 Last data filed at 04/30/2021 1140 Gross per 24 hour  Intake 480 ml  Output 3000 ml  Net -2520 ml   Filed Weights   04/30/21 0300  Weight: (!) 150.7 kg    Examination:  General exam: Appears calm and comfortable, morbidly obese Respiratory system: Diminished breath sounds due to body habitus, respiratory effort normal. Cardiovascular system: S1 & S2 heard, RRR. No JVD, murmurs, rubs, gallops or clicks.  +2 pitting edema bilateral lower extremity. Gastrointestinal system: Abdomen is nondistended, soft and nontender. No organomegaly  or masses felt. Normal bowel sounds heard.  Massive scrotal edema. Central nervous system: Alert and oriented. No focal neurological deficits. Extremities: Symmetric 5 x 5 power. Skin: No rashes, lesions or ulcers Psychiatry: Judgement and insight appear poor.   Data Reviewed: I have personally reviewed following labs and imaging studies  CBC: Recent Labs  Lab 04/29/21 1010 04/30/21 0206  WBC 7.3 7.7  HGB 14.1 14.1  HCT 46.1 46.1  MCV 97.5 96.6  PLT 253 243   Basic Metabolic Panel: Recent Labs  Lab 04/29/21 1010 04/29/21 1946 04/30/21 0206  NA 138  --  136  K 4.1  --  4.2  CL 91*  --  86*  CO2 38*  --  39*  GLUCOSE 132*  --  144*  BUN 27*  --  36*  CREATININE 1.78*  --  2.21*  CALCIUM 9.5  --  9.2  MG  --  2.1  --    GFR: Estimated Creatinine Clearance: 52.4 mL/min (A) (by C-G formula based on SCr of 2.21 mg/dL (H)). Liver Function Tests: No results for input(s): AST, ALT, ALKPHOS, BILITOT, PROT, ALBUMIN in the last 168 hours. No results for input(s): LIPASE, AMYLASE  in the last 168 hours. No results for input(s): AMMONIA in the last 168 hours. Coagulation Profile: No results for input(s): INR, PROTIME in the last 168 hours. Cardiac Enzymes: No results for input(s): CKTOTAL, CKMB, CKMBINDEX, TROPONINI in the last 168 hours. BNP (last 3 results) No results for input(s): PROBNP in the last 8760 hours. HbA1C: No results for input(s): HGBA1C in the last 72 hours. CBG: No results for input(s): GLUCAP in the last 168 hours. Lipid Profile: No results for input(s): CHOL, HDL, LDLCALC, TRIG, CHOLHDL, LDLDIRECT in the last 72 hours. Thyroid Function Tests: Recent Labs    04/29/21 1802  TSH 0.962   Anemia Panel: No results for input(s): VITAMINB12, FOLATE, FERRITIN, TIBC, IRON, RETICCTPCT in the last 72 hours. Sepsis Labs: No results for input(s): PROCALCITON, LATICACIDVEN in the last 168 hours.  Recent Results (from the past 240 hour(s))  Resp Panel by RT-PCR (Flu A&B, Covid) Nasopharyngeal Swab     Status: None   Collection Time: 04/29/21  3:51 PM   Specimen: Nasopharyngeal Swab; Nasopharyngeal(NP) swabs in vial transport medium  Result Value Ref Range Status   SARS Coronavirus 2 by RT PCR NEGATIVE NEGATIVE Final    Comment: (NOTE) SARS-CoV-2 target nucleic acids are NOT DETECTED.  The SARS-CoV-2 RNA is generally detectable in upper respiratory specimens during the acute phase of infection. The lowest concentration of SARS-CoV-2 viral copies this assay can detect is 138 copies/mL. A negative result does not preclude SARS-Cov-2 infection and should not be used as the sole basis for treatment or other patient management decisions. A negative result may occur with  improper specimen collection/handling, submission of specimen other than nasopharyngeal swab, presence of viral mutation(s) within the areas targeted by this assay, and inadequate number of viral copies(<138 copies/mL). A negative result must be combined with clinical observations,  patient history, and epidemiological information. The expected result is Negative.  Fact Sheet for Patients:  BloggerCourse.comhttps://www.fda.gov/media/152166/download  Fact Sheet for Healthcare Providers:  SeriousBroker.ithttps://www.fda.gov/media/152162/download  This test is no t yet approved or cleared by the [place] FDA and  has been authorized for detection and/or diagnosis of SARS-CoV-2 by FDA under an Emergency Use Authorization (EUA). This EUA will remain  in effect (meaning this test can be used) for the duration of the COVID-19 declaration under Section  564(b)(1) of the Act, 21 U.S.C.section 360bbb-3(b)(1), unless the authorization is terminated  or revoked sooner.       Influenza A by PCR NEGATIVE NEGATIVE Final   Influenza B by PCR NEGATIVE NEGATIVE Final    Comment: (NOTE) The Xpert Xpress SARS-CoV-2/FLU/RSV plus assay is intended as an aid in the diagnosis of influenza from Nasopharyngeal swab specimens and should not be used as a sole basis for treatment. Nasal washings and aspirates are unacceptable for Xpert Xpress SARS-CoV-2/FLU/RSV testing.  Fact Sheet for Patients: EntrepreneurPulse.com.au  Fact Sheet for Healthcare Providers: IncredibleEmployment.be  This test is not yet approved or cleared by the [place] FDA and has been authorized for detection and/or diagnosis of SARS-CoV-2 by FDA under an Emergency Use Authorization (EUA). This EUA will remain in effect (meaning this test can be used) for the duration of the COVID-19 declaration under Section 564(b)(1) of the Act, 21 U.S.C. section 360bbb-3(b)(1), unless the authorization is terminated or revoked.  Performed at [facility], [place] [address], [place], [place] [address]   MRSA Next Gen by PCR, Nasal     Status: Abnormal   Collection Time: 04/29/21  8:00 PM   Specimen: Nasal Mucosa; Nasal Swab  Result Value Ref Range Status   MRSA by PCR Next Gen DETECTED (A) NOT DETECTED  Final    Comment: RESULT CALLED TO, READ BACK BY AND VERIFIED WITH: D HART,RN@[phone]  04/30/21 [place] (NOTE) The GeneXpert MRSA Assay (FDA approved for NASAL specimens only), is one component of a comprehensive MRSA colonization surveillance program. It is not intended to diagnose MRSA infection nor to guide or monitor treatment for MRSA infections. Test performance is not FDA approved in patients less than 20 years old. Performed at [facility], [place] [address] [place], [place] [address]       Radiology Studies: CT ABDOMEN PELVIS WO CONTRAST  Result Date: 04/29/2021 CLINICAL DATA:  Lymphadenopathy, groin abdominal pain. EXAM: CT ABDOMEN AND PELVIS WITHOUT CONTRAST TECHNIQUE: Multidetector CT imaging of the abdomen and pelvis was performed following the standard protocol without IV contrast. COMPARISON:  None. FINDINGS: Lower chest: Right basilar atelectasis. Hepatobiliary: No focal liver abnormality is seen. No gallstones, gallbladder wall thickening, or biliary dilatation. Pancreas: Unremarkable. No pancreatic ductal dilatation or surrounding inflammatory changes. Spleen: Normal in size without focal abnormality. Adrenals/Urinary Tract: Adrenal glands are unremarkable. Kidneys are normal, without renal calculi, focal lesion, or hydronephrosis. Simple left renal cysts in the upper and lower pole. Bladder is unremarkable. Stomach/Bowel: Stomach is within normal limits. Appendix appears normal. No evidence of bowel wall thickening, distention, or inflammatory changes. Vascular/Lymphatic: Aortic atherosclerosis. Mildly enlarged bilateral inguinal lymph nodes. No retroperitoneal fibrosis or lymphadenopathy. Reproductive: Prostate is mildly enlarged. Other: There is marked skin thickening of the scrotal sac with bilateral hydroceles. Fat containing umbilical hernia. Musculoskeletal: Mild degenerate disc disease of the lumbar spine. No acute osseous abnormality or suspicious osseous lesion. IMPRESSION:  1. Marked skin thickening of the scrotal sac with bilateral hydroceles concerning for cellulitis. Mildly enlarged bilateral inguinal lymph nodes. No abdominal/pelvic lymphadenopathy. This may be secondary to scrotal infectious/inflammatory process. Clinical correlation is suggested. 2.  No CT evidence of acute abdominal/pelvic process. Electronically Signed   By: Keane Police D.O.   On: 04/29/2021 16:31   DG Chest 2 View  Result Date: 04/29/2021 CLINICAL DATA:  Shortness of breath. Known congestive heart failure. EXAM: CHEST - 2 VIEW COMPARISON:  03/11/2021 FINDINGS: Both views are degraded by patient size. Low lung volumes, especially  on the frontal radiograph. Midline trachea. Mild cardiomegaly. Radiopaque densities projecting over the lower chest bilaterally may be external to the patient. Not localized on the frontal. No pleural effusion or pneumothorax. No congestive failure. Bibasilar atelectasis, greater on the right. IMPRESSION: Cardiomegaly and low lung volumes with bibasilar atelectasis. No acute superimposed process. Decreased sensitivity and specificity exam due to technique related factors, as described above. Electronically Signed   By: Abigail Miyamoto M.D.   On: 04/29/2021 10:55   US SCROTUM W/DOPPLER  Result Date: 04/29/2021 CLINICAL DATA:  A 65 year old male presents for evaluation of scrotal pain. EXAM: SCROTAL ULTRASOUND DOPPLER ULTRASOUND OF THE TESTICLES TECHNIQUE: Complete ultrasound examination of the testicles, epididymis, and other scrotal structures was performed. Color and spectral Doppler ultrasound were also utilized to evaluate blood flow to the testicles. COMPARISON:  None FINDINGS: Right testicle Measurements: 4.5 x 3.5 x 2.9 cm. No mass or microlithiasis visualized. Left testicle Measurements: 4.2 x 3.2 x 3.2 cm. No mass or microlithiasis visualized. Right epididymis:  Normal in size and appearance. Left epididymis:  Normal in size and appearance. Hydrocele: Bilateral large  hydroceles. Small areas of echogenic debris without septation within the RIGHT hydrocele. Varicocele: RIGHT-sided varicocele up to 5 mm. No signs of LEFT varicocele. Pulsed Doppler interrogation of both testes demonstrates normal low resistance arterial and venous waveforms bilaterally. IMPRESSION: No signs of testicular torsion. Scrotal wall thickening and bilateral hydroceles, minimally complex on the RIGHT without signs of septation. Scrotal thickening and hydroceles more likely relate to volume overload and or heart failure/anasarca. Would however correlate with any signs of infection over the scrotum. Potential isolated RIGHT varicocele. Attention on upcoming CT of the abdomen and pelvis for any signs of retroperitoneal mass or similar process that would explain and isolated RIGHT varicocele. Electronically Signed   By: Zetta Bills M.D.   On: 04/29/2021 15:50    Scheduled Meds:  amLODipine  10 mg Oral Daily   amoxicillin  500 mg Oral Q8H   apixaban  5 mg Oral BID   atorvastatin  10 mg Oral Daily   famotidine  20 mg Oral BID   fluticasone furoate-vilanterol  1 puff Inhalation Daily   furosemide  80 mg Intravenous BID   isosorbide dinitrate  20 mg Oral TID   mupirocin ointment  1 application Nasal BID   sodium chloride flush  3 mL Intravenous Q12H   sodium chloride flush  3 mL Intravenous Q12H   tamsulosin  0.4 mg Oral QPC supper   Continuous Infusions:  sodium chloride     sodium chloride       LOS: 0 days   Time spent: 36 minutes   Darliss Cheney, MD [facility]  04/30/2021, 11:53 AM  Please page via [place] [place] and do not message via secure chat for anything urgent. Secure chat can be used for anything non urgent.  How to contact the [facility] Attending or Consulting provider [place] or covering provider during after hours [place], for this patient?  Check the care team in [facility] and look for a) attending/consulting [facility] provider listed and b) the [facility] team listed. Page or secure chat  7A-7P. Log into [email or web address] and use [place]'s universal password to access. If you do not have the password, please contact the hospital operator. Locate the [facility] provider you are looking for under [facility] and page to a number that you can be directly reached. If you still have difficulty reaching the provider, please page the [facility] (Director on  Call) for the Hospitalists listed on amion for assistance.

## 2021-04-30 NOTE — Progress Notes (Signed)
Heart Failure Nurse Navigator Progress Note  Following this hospitalization to assess for HV TOC readiness. Pending updated ECHO.   2019:40-45% 01/2021: 60-65%  Will plan to interview patient tomorrow.   Ozella Rocks, MSN, RN Heart Failure Nurse Navigator [phone]

## 2021-04-30 NOTE — Progress Notes (Signed)
Mobility Specialist Progress Note    04/30/21 1454  Mobility  Activity Ambulated in hall  Level of Assistance Standby assist, set-up cues, supervision of patient - no hands on  Assistive Device Front wheel walker  Distance Ambulated (ft) 450 ft  Mobility Ambulated with assistance in hallway  Mobility Response Tolerated fair  Mobility performed by Mobility specialist  Bed Position Chair  $Mobility charge 1 Mobility   Pt received in bed and agreeable. Ambulated on 8LO2. Encouraged pt to focus on pursed lip breathing to maintain SpO2 in low 90s. Returned to chair with call bell in reach.   [facility] Mobility Specialist  M.S. Primary Phone: [phone] M.S. Secondary Phone: [phone]

## 2021-04-30 NOTE — Progress Notes (Signed)
Patient called about his home oxygen tank that he brought with him to ED. He stated that when he was just transported to [facility], the transporter told him that he will bring his O2 Tank to his room but never came back. Patient described his O2 tank with wheels and has long tubing.  Pt was admitted on [facility] around 1702 of 04/29/21. Thus far, No O2 was documented on his belongings when he was admitted.  Called ED at 2155 of today but then they transferred my call to Security instead, Spoke to Hendrix and he said that unused O2 Tank were all put in the storage and there is no way to see what is his. Will address it to leadership.

## 2021-05-01 DIAGNOSIS — N433 Hydrocele, unspecified: Secondary | ICD-10-CM | POA: Diagnosis not present

## 2021-05-01 DIAGNOSIS — J9612 Chronic respiratory failure with hypercapnia: Secondary | ICD-10-CM | POA: Diagnosis not present

## 2021-05-01 DIAGNOSIS — I10 Essential (primary) hypertension: Secondary | ICD-10-CM

## 2021-05-01 DIAGNOSIS — I5033 Acute on chronic diastolic (congestive) heart failure: Secondary | ICD-10-CM | POA: Diagnosis not present

## 2021-05-01 LAB — BASIC METABOLIC PANEL
Anion gap: 8 (ref 5–15)
BUN: 34 mg/dL — ABNORMAL HIGH (ref 8–23)
CO2: 45 mmol/L — ABNORMAL HIGH (ref 22–32)
Calcium: 8.9 mg/dL (ref 8.9–10.3)
Chloride: 83 mmol/L — ABNORMAL LOW (ref 98–111)
Creatinine, Ser: 1.83 mg/dL — ABNORMAL HIGH (ref 0.61–1.24)
GFR, Estimated: 41 mL/min — ABNORMAL LOW (ref >60)
Glucose, Bld: 116 mg/dL — ABNORMAL HIGH (ref 70–99)
Potassium: 3.4 mmol/L — ABNORMAL LOW (ref 3.5–5.1)
Sodium: 136 mmol/L (ref 135–145)

## 2021-05-01 MED ORDER — SALINE SPRAY 0.65 % NA SOLN
1.0000 | NASAL | Status: DC | PRN
  Administered 2021-05-01: 01:00:00 1 via NASAL
  Filled 2021-05-01: qty 44

## 2021-05-01 MED ORDER — POTASSIUM CHLORIDE CRYS ER 20 MEQ PO TBCR
40.0000 meq | EXTENDED_RELEASE_TABLET | Freq: Once | ORAL | Status: AC
  Administered 2021-05-01: 09:00:00 40 meq via ORAL
  Filled 2021-05-01: qty 2

## 2021-05-01 NOTE — Progress Notes (Signed)
Progress Note  Patient Name: Zachary Hawkins Date of Encounter: 05/01/2021  Primary Cardiologist:   Thurmon Fair, MD   Subjective   Breathing OK.  Still with swelling of his scrotum and soreness.  No other pain.   Inpatient Medications    Scheduled Meds:  amLODipine  5 mg Oral Daily   amoxicillin  500 mg Oral Q8H   apixaban  5 mg Oral BID   atorvastatin  10 mg Oral Daily   famotidine  20 mg Oral BID   fluticasone furoate-vilanterol  1 puff Inhalation Daily   furosemide  80 mg Intravenous BID   isosorbide dinitrate  20 mg Oral TID   mupirocin ointment  1 application Nasal BID   sodium chloride flush  3 mL Intravenous Q12H   sodium chloride flush  3 mL Intravenous Q12H   tamsulosin  0.4 mg Oral QPC supper   Continuous Infusions:  sodium chloride     sodium chloride     PRN Meds: sodium chloride, sodium chloride, acetaminophen **OR** acetaminophen, albuterol, HYDROcodone-acetaminophen, sodium chloride, sodium chloride flush, sodium chloride flush   Vital Signs    Vitals:   04/30/21 1940 04/30/21 2332 05/01/21 0408 05/01/21 0744  BP: (!) 135/91  113/84 116/84  Pulse: 88 89 84 94  Resp:  20    Temp: 99.1 F (37.3 C)  98.9 F (37.2 C) 98.1 F (36.7 C)  TempSrc: Oral   Oral  SpO2: 95% 96% 94% 95%  Weight:   (!) 150.4 kg     Intake/Output Summary (Last 24 hours) at 05/01/2021 1116 Last data filed at 05/01/2021 1014 Gross per 24 hour  Intake 1080 ml  Output 3450 ml  Net -2370 ml   Filed Weights   04/30/21 0300 05/01/21 0408  Weight: (!) 150.7 kg (!) 150.4 kg    Telemetry    NSR - Personally Reviewed  ECG    NA - Personally Reviewed  Physical Exam   GEN: No  acute distress.   Neck: No  JVD Cardiac: RRR, no murmurs, rubs, or gallops.  Respiratory: Clear  to auscultation bilaterally. GI: Soft, nontender, non-distended, normal bowel sounds  MS:  Moderate leg edema; No deformity. Neuro:   Nonfocal  Psych: Oriented and appropriate     Labs     Chemistry Recent Labs  Lab 04/29/21 1010 04/30/21 0206 05/01/21 0218  NA 138 136 136  K 4.1 4.2 3.4*  CL 91* 86* 83*  CO2 38* 39* 45*  GLUCOSE 132* 144* 116*  BUN 27* 36* 34*  CREATININE 1.78* 2.21* 1.83*  CALCIUM 9.5 9.2 8.9  GFRNONAA 42* 33* 41*  ANIONGAP 9 11 8      Hematology Recent Labs  Lab 04/29/21 1010 04/30/21 0206  WBC 7.3 7.7  RBC 4.73 4.77  HGB 14.1 14.1  HCT 46.1 46.1  MCV 97.5 96.6  MCH 29.8 29.6  MCHC 30.6 30.6  RDW 14.6 14.6  PLT 253 243    Cardiac EnzymesNo results for input(s): TROPONINI in the last 168 hours. No results for input(s): TROPIPOC in the last 168 hours.   BNP Recent Labs  Lab 04/29/21 1010  BNP 31.0     DDimer No results for input(s): DDIMER in the last 168 hours.   Radiology    CT ABDOMEN PELVIS WO CONTRAST  Result Date: 04/29/2021 CLINICAL DATA:  Lymphadenopathy, groin abdominal pain. EXAM: CT ABDOMEN AND PELVIS WITHOUT CONTRAST TECHNIQUE: Multidetector CT imaging of the abdomen and pelvis was performed following the standard protocol  without IV contrast. COMPARISON:  None. FINDINGS: Lower chest: Right basilar atelectasis. Hepatobiliary: No focal liver abnormality is seen. No gallstones, gallbladder wall thickening, or biliary dilatation. Pancreas: Unremarkable. No pancreatic ductal dilatation or surrounding inflammatory changes. Spleen: Normal in size without focal abnormality. Adrenals/Urinary Tract: Adrenal glands are unremarkable. Kidneys are normal, without renal calculi, focal lesion, or hydronephrosis. Simple left renal cysts in the upper and lower pole. Bladder is unremarkable. Stomach/Bowel: Stomach is within normal limits. Appendix appears normal. No evidence of bowel wall thickening, distention, or inflammatory changes. Vascular/Lymphatic: Aortic atherosclerosis. Mildly enlarged bilateral inguinal lymph nodes. No retroperitoneal fibrosis or lymphadenopathy. Reproductive: Prostate is mildly enlarged. Other: There is marked  skin thickening of the scrotal sac with bilateral hydroceles. Fat containing umbilical hernia. Musculoskeletal: Mild degenerate disc disease of the lumbar spine. No acute osseous abnormality or suspicious osseous lesion. IMPRESSION: 1. Marked skin thickening of the scrotal sac with bilateral hydroceles concerning for cellulitis. Mildly enlarged bilateral inguinal lymph nodes. No abdominal/pelvic lymphadenopathy. This may be secondary to scrotal infectious/inflammatory process. Clinical correlation is suggested. 2.  No CT evidence of acute abdominal/pelvic process. Electronically Signed   By: Keane Police D.O.   On: 04/29/2021 16:31   ECHOCARDIOGRAM COMPLETE  Result Date: 04/30/2021    ECHOCARDIOGRAM REPORT   Patient Name:   Zachary Hawkins Date of Exam: 04/30/2021 Medical Rec #:  [id number]       Height:       73.0 in Accession #:    [id number]      Weight:       332.2 lb Date of Birth:  1955-07-17       BSA:          2.669 m Patient Age:    65 years        BP:           106/84 mmHg Patient Gender: M               HR:           77 bpm. Exam Location:  Inpatient Procedure: 2D Echo Indications:    acute diastolic chf  History:        Patient has prior history of Echocardiogram examinations. CHF,                 chronic kidney disease, Arrythmias:Paroxysmal A-fib; Risk                 Factors:Sleep Apnea and Hypertension.  Sonographer:    Johny Chess RDCS Referring Phys: [id number] [place] [facility]  Sonographer Comments: Suboptimal apical window and patient is morbidly obese. Image acquisition challenging due to respiratory motion and Image acquisition challenging due to patient body habitus. IMPRESSIONS  1. Left ventricular ejection fraction, by estimation, is 60 to 65%. The left ventricle has normal function. The left ventricle has no regional wall motion abnormalities. Left ventricular diastolic parameters were normal.  2. Right ventricular systolic function is normal. The right ventricular size is not well  visualized.  3. The mitral valve is grossly normal. No evidence of mitral valve regurgitation. No evidence of mitral stenosis.  4. The aortic valve is grossly normal. Aortic valve regurgitation is not visualized. No aortic stenosis is present.  5. Aortic dilatation noted. There is mild dilatation of the ascending aorta, measuring 39 mm.  6. The inferior vena cava is normal in size with greater than 50% respiratory variability, suggesting right atrial pressure of 3 mmHg. Comparison(s): No significant change from prior study.  FINDINGS  Left Ventricle: Left ventricular ejection fraction, by estimation, is 60 to 65%. The left ventricle has normal function. The left ventricle has no regional wall motion abnormalities. Definity contrast agent was given IV to delineate the left ventricular  endocardial borders. The left ventricular internal cavity size was normal in size. There is borderline left ventricular hypertrophy. Left ventricular diastolic parameters were normal. Right Ventricle: The right ventricular size is not well visualized. Right vetricular wall thickness was not well visualized. Right ventricular systolic function is normal. Left Atrium: Left atrial size was normal in size. Right Atrium: Right atrial size was normal in size. Pericardium: There is no evidence of pericardial effusion. Mitral Valve: The mitral valve is grossly normal. No evidence of mitral valve regurgitation. No evidence of mitral valve stenosis. Tricuspid Valve: The tricuspid valve is grossly normal. Tricuspid valve regurgitation is not demonstrated. No evidence of tricuspid stenosis. Aortic Valve: The aortic valve is grossly normal. Aortic valve regurgitation is not visualized. No aortic stenosis is present. Pulmonic Valve: The pulmonic valve was not well visualized. Pulmonic valve regurgitation is not visualized. Aorta: Aortic dilatation noted. There is mild dilatation of the ascending aorta, measuring 39 mm. Venous: The inferior vena cava  is normal in size with greater than 50% respiratory variability, suggesting right atrial pressure of 3 mmHg. IAS/Shunts: The atrial septum is grossly normal.  LEFT VENTRICLE PLAX 2D LVIDd:         4.50 cm   Diastology LVIDs:         3.40 cm   LV e' medial:    6.42 cm/s LV PW:         1.30 cm   LV E/e' medial:  8.9 LVOT diam:     2.10 cm   LV e' lateral:   8.59 cm/s LVOT Area:     3.46 cm  LV E/e' lateral: 6.7  RIGHT VENTRICLE         IVC TAPSE (M-mode): 2.1 cm  IVC diam: 2.00 cm LEFT ATRIUM             Index        RIGHT ATRIUM           Index LA diam:        3.30 cm 1.24 cm/m   RA Area:     14.50 cm LA Vol (A2C):   81.0 ml 30.34 ml/m  RA Volume:   34.80 ml  13.04 ml/m LA Vol (A4C):   53.1 ml 19.89 ml/m LA Biplane Vol: 68.1 ml 25.51 ml/m   AORTA Ao Root diam: 3.10 cm Ao Asc diam:  3.90 cm MITRAL VALVE MV Area (PHT): 3.42 cm    SHUNTS MV Decel Time: 222 msec    Systemic Diam: 2.10 cm MV E velocity: 57.20 cm/s MV A velocity: 67.20 cm/s MV E/A ratio:  0.85 Buford Dresser MD Electronically signed by Buford Dresser MD Signature Date/Time: 04/30/2021/12:43:59 PM    Final    US SCROTUM W/DOPPLER  Result Date: 04/29/2021 CLINICAL DATA:  A 65 year old male presents for evaluation of scrotal pain. EXAM: SCROTAL ULTRASOUND DOPPLER ULTRASOUND OF THE TESTICLES TECHNIQUE: Complete ultrasound examination of the testicles, epididymis, and other scrotal structures was performed. Color and spectral Doppler ultrasound were also utilized to evaluate blood flow to the testicles. COMPARISON:  None FINDINGS: Right testicle Measurements: 4.5 x 3.5 x 2.9 cm. No mass or microlithiasis visualized. Left testicle Measurements: 4.2 x 3.2 x 3.2 cm. No mass or microlithiasis visualized. Right epididymis:  Normal  in size and appearance. Left epididymis:  Normal in size and appearance. Hydrocele: Bilateral large hydroceles. Small areas of echogenic debris without septation within the RIGHT hydrocele. Varicocele:  RIGHT-sided varicocele up to 5 mm. No signs of LEFT varicocele. Pulsed Doppler interrogation of both testes demonstrates normal low resistance arterial and venous waveforms bilaterally. IMPRESSION: No signs of testicular torsion. Scrotal wall thickening and bilateral hydroceles, minimally complex on the RIGHT without signs of septation. Scrotal thickening and hydroceles more likely relate to volume overload and or heart failure/anasarca. Would however correlate with any signs of infection over the scrotum. Potential isolated RIGHT varicocele. Attention on upcoming CT of the abdomen and pelvis for any signs of retroperitoneal mass or similar process that would explain and isolated RIGHT varicocele. Electronically Signed   By: Zetta Bills M.D.   On: 04/29/2021 15:50    Cardiac Studies   ECHO :  Pending    Patient Profile     65 y.o. 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 Rogers Blocker.  Assessment & Plan    Acute on chronic diastolic heart failure: Continue IV diuresis today.   Compression stockings ordered and I put his feet up again and had this discussion again with him today.    PAF:  NSR . On DOAC  Hypertension:    Norvasc decreased yesterday.  BP is well controlled.    Acute on chronic kidney disease stage III:    Net negative 2390 yesterday with net negative 3980 since admission.  Creat is better.   Continue therapy as above.   For questions or updates, please contact [place] Please consult [email or web address] for contact info under Cardiology/STEMI.   Signed, Minus Breeding, MD  05/01/2021, 11:16 AM

## 2021-05-01 NOTE — Progress Notes (Signed)
Mobility Specialist Progress Note:   05/01/21 1642  Mobility  Activity Ambulated to bathroom  Level of Assistance Modified independent, requires aide device or extra time  Assistive Device None  Distance Ambulated (ft) 30 ft  Mobility Out of bed for toileting  Mobility Response Tolerated well  Mobility performed by Mobility specialist  Bed Position Chair  $Mobility charge 1 Mobility    [facility] Dayla Gasca Mobility Specialist Primary Phone [phone] Secondary Phone [phone]

## 2021-05-01 NOTE — Progress Notes (Signed)
PROGRESS NOTE    Zachary Hawkins  [id number] DOB: May 17, 1955 DOA: 04/29/2021 PCP: Pcp, No   Brief Narrative:  Zachary Hawkins is a 65 y.o. male with medical history significant of CKD stage IIIa, diastolic and systolic CHF, HTN, atrial fibrillation on eliquis, OSA on CPAP at night (doesn't wear nightly), ACE-I angioedema (10/2017), undocumented COPD, chronic respiratory failure on 4-6L [place] after recent hospital discharge in November of this year who presented to ED with complaints of shortness of breath and swollen testicles that started about 2-3 days ago associated with bilateral lower extremity edema and orthopnea.  He saw Dr. Sallyanne Kuster 04/24/21 and metolazone was added on in the AM before his furosemide twice weekly. He has also been taking ibuprofen for his tooth that was pulled. He has not been compliant with medication and never even picked up the metolazone per cardiology note. He also doesn't wear his oxygen like he is supposed to at home and states when his oxygen was in the 80s today he had taken it off to go to the bathroom and left it off for a bit.    He had recent admit in 03/10/21-03/16/2021 for acute on chronic respiratory failure with pulmonary edema, hx of OSA not treated and suspected COPD with acute exacerbation. He required intubation on 03/11/21 and extubated on 03/12/21. He was discharged on 6L [place] and advised to f/u with pulm which he has done, but has never completed his PFTs. He also was diuresed and weight at time of discharge was 322 pounds. Weight 5 days ago was 345 pounds.   ED Course: vitals: afebrile, bp: 130/72, HR: 78, RR: 18, oxygen: 99% on 3L [place] Pertinent labs: CO2: 38, BUN: 27, creatinine: 1.78 (1.2-1.5), BNP: 31 CXR: cardiomegaly and low lung volumes with bibasilar atelectasis. No acute process.   Assessment & Plan:   Principal Problem:   Acute exacerbation of CHF (congestive heart failure) (HCC) Active Problems:   Essential hypertension   OSA (obstructive  sleep apnea)   Paroxysmal atrial fibrillation (HCC)   Acute renal failure superimposed on stage 3 chronic kidney disease (HCC)   Chronic respiratory failure with hypercapnia (HCC)   Bilateral hydrocele   Dyspnea on exertion   Acute on chronic diastolic (congestive) heart failure (HCC)  Chronic respiratory failure with hypoxia/acute on chronic diastolic congestive heart failure: Presented with 20+ pound weight gain with increased LE swelling and scrotal swelling. Dry weight around 322 (was 322 on hospital d/c 03/16/21. 345lbs on the day of admission) Last echo in 01/2021 showed EF to 60-65% and grade 1 diastolic dysfunction.  Repeat echo shows normal ejection fraction, no wall motion abnormality.  Still on 5 L oxygen which is his baseline.  He has had good diuresis of 2.3 L in last 24 hours with net -4 L.  Continue IV diuresis.  Cardiology managing.  AKI on CKD stage IIIa: Baseline creatinine anywhere between 1.1-1.4.  Presented with 1.78 and peaked at 2.21, likely cardiorenal/poor perfusion to kidneys.  Noncompliant with medications.  Never picked up metolazone.  Has also been taking NSAIDs after having his tooth pulled.  Creatinine improved somewhat.  Continue to avoid nephrotoxic agents and watch closely while he is on IV diuretics.  Hypokalemia: We will replace.  Bilateral hydroceles: Likely due to volume overload. Physical exam not consistent with cellulitis, no abx at this time. isolated right varicocele on scrotal US.   Paroxysmal atrial fibrillation (HCC) In NSR, rate controlled, continue telemetry  CHaDSVasc score of 2, continue his eliquis  for [facility] Continue to hold bisprolol in setting of acute on chronic CHF    Essential hypertension: Very well controlled.  Continue Isordil and amlodipine (dose decreased to 5 mg by ) but hold Zebeta and home dose of hydralazine while he is on IV diuretics.  OSA (obstructive sleep apnea): not very compliant, wearing about 2-3 hours at a time.  Nightly  CPAP.   Undocumented COPD: saw pulmonology 1x and has never followed up for PFTs.  Appears to be stable.   Tooth abscess with recent extraction  Continue amoxicillin   HLD: Continue atorvastatin.  BPH: Continue Flomax  DVT prophylaxis: Place TED hose Start: 04/30/21 1211   Code Status: Full Code  Family Communication:  None present at bedside.  Plan of care discussed with patient in length and he verbalized understanding and agreed with it.  Status is: Inpatient  Remains inpatient appropriate because: Needs IV diuresis.  Estimated body mass index is 43.75 kg/m as calculated from the following:   Height as of 04/24/21: 6\' 1"  (1.854 m).   Weight as of this encounter: 150.4 kg.   Nutritional Assessment: Body mass index is 43.75 kg/m.Marland Kitchen Seen by dietician.  I agree with the assessment and plan as outlined below: Nutrition Status:   Skin Assessment: I have examined the patient's skin and I agree with the wound assessment as performed by the wound care RN as outlined below:    Consultants:  Cardiology  Procedures:  None  Antimicrobials:  Anti-infectives (From admission, onward)    Start     Dose/Rate Route Frequency Ordered Stop   04/29/21 2200  amoxicillin (AMOXIL) capsule 500 mg        500 mg Oral Every 8 hours 04/29/21 2028            Subjective: Seen and examined.  No shortness of breath or other complaint.  Still concerned about scrotal edema.  Objective: Vitals:   04/30/21 1940 04/30/21 2332 05/01/21 0408 05/01/21 0744  BP: (!) 135/91  113/84 116/84  Pulse: 88 89 84 94  Resp:  20    Temp: 99.1 F (37.3 C)  98.9 F (37.2 C) 98.1 F (36.7 C)  TempSrc: Oral   Oral  SpO2: 95% 96% 94% 95%  Weight:   (!) 150.4 kg     Intake/Output Summary (Last 24 hours) at 05/01/2021 0824 Last data filed at 05/01/2021 1607 Gross per 24 hour  Intake 1200 ml  Output 3350 ml  Net -2150 ml    Filed Weights   04/30/21 0300 05/01/21 0408  Weight: (!) 150.7 kg (!)  150.4 kg    Examination:  General exam: Appears calm and comfortable, morbidly obese Respiratory system: Diminished breath sounds due to body habitus. Respiratory effort normal. Cardiovascular system: S1 & S2 heard, RRR. No JVD, murmurs, rubs, gallops or clicks.  Trace pitting edema bilateral lower extremity Gastrointestinal system: Abdomen is nondistended, soft and nontender. No organomegaly or masses felt. Normal bowel sounds heard.  Moderate scrotal edema Central nervous system: Alert and oriented. No focal neurological deficits. Extremities: Symmetric 5 x 5 power. Skin: No rashes, lesions or ulcers.  Psychiatry: Judgement and insight appear normal. Mood & affect appropriate.   Data Reviewed: I have personally reviewed following labs and imaging studies  CBC: Recent Labs  Lab 04/29/21 1010 04/30/21 0206  WBC 7.3 7.7  HGB 14.1 14.1  HCT 46.1 46.1  MCV 97.5 96.6  PLT 253 243    Basic Metabolic Panel: Recent Labs  Lab 04/29/21 1010  04/29/21 1946 04/30/21 0206 05/01/21 0218  NA 138  --  136 136  K 4.1  --  4.2 3.4*  CL 91*  --  86* 83*  CO2 38*  --  39* 45*  GLUCOSE 132*  --  144* 116*  BUN 27*  --  36* 34*  CREATININE 1.78*  --  2.21* 1.83*  CALCIUM 9.5  --  9.2 8.9  MG  --  2.1  --   --     GFR: Estimated Creatinine Clearance: 63.2 mL/min (A) (by C-G formula based on SCr of 1.83 mg/dL (H)). Liver Function Tests: No results for input(s): AST, ALT, ALKPHOS, BILITOT, PROT, ALBUMIN in the last 168 hours. No results for input(s): LIPASE, AMYLASE in the last 168 hours. No results for input(s): AMMONIA in the last 168 hours. Coagulation Profile: No results for input(s): INR, PROTIME in the last 168 hours. Cardiac Enzymes: No results for input(s): CKTOTAL, CKMB, CKMBINDEX, TROPONINI in the last 168 hours. BNP (last 3 results) No results for input(s): PROBNP in the last 8760 hours. HbA1C: No results for input(s): HGBA1C in the last 72 hours. CBG: No results for  input(s): GLUCAP in the last 168 hours. Lipid Profile: No results for input(s): CHOL, HDL, LDLCALC, TRIG, CHOLHDL, LDLDIRECT in the last 72 hours. Thyroid Function Tests: Recent Labs    04/29/21 1802  TSH 0.962    Anemia Panel: No results for input(s): VITAMINB12, FOLATE, FERRITIN, TIBC, IRON, RETICCTPCT in the last 72 hours. Sepsis Labs: No results for input(s): PROCALCITON, LATICACIDVEN in the last 168 hours.  Recent Results (from the past 240 hour(s))  Resp Panel by RT-PCR (Flu A&B, Covid) Nasopharyngeal Swab     Status: None   Collection Time: 04/29/21  3:51 PM   Specimen: Nasopharyngeal Swab; Nasopharyngeal(NP) swabs in vial transport medium  Result Value Ref Range Status   SARS Coronavirus 2 by RT PCR NEGATIVE NEGATIVE Final    Comment: (NOTE) SARS-CoV-2 target nucleic acids are NOT DETECTED.  The SARS-CoV-2 RNA is generally detectable in upper respiratory specimens during the acute phase of infection. The lowest concentration of SARS-CoV-2 viral copies this assay can detect is 138 copies/mL. A negative result does not preclude SARS-Cov-2 infection and should not be used as the sole basis for treatment or other patient management decisions. A negative result may occur with  improper specimen collection/handling, submission of specimen other than nasopharyngeal swab, presence of viral mutation(s) within the areas targeted by this assay, and inadequate number of viral copies(<138 copies/mL). A negative result must be combined with clinical observations, patient history, and epidemiological information. The expected result is Negative.  Fact Sheet for Patients:  BloggerCourse.com  Fact Sheet for Healthcare Providers:  SeriousBroker.it  This test is no t yet approved or cleared by the [place] FDA and  has been authorized for detection and/or diagnosis of SARS-CoV-2 by FDA under an Emergency Use Authorization (EUA).  This EUA will remain  in effect (meaning this test can be used) for the duration of the COVID-19 declaration under Section 564(b)(1) of the Act, 21 U.S.C.section 360bbb-3(b)(1), unless the authorization is terminated  or revoked sooner.       Influenza A by PCR NEGATIVE NEGATIVE Final   Influenza B by PCR NEGATIVE NEGATIVE Final    Comment: (NOTE) The Xpert Xpress SARS-CoV-2/FLU/RSV plus assay is intended as an aid in the diagnosis of influenza from Nasopharyngeal swab specimens and should not be used as a sole basis for treatment. Nasal washings and aspirates  are unacceptable for Xpert Xpress SARS-CoV-2/FLU/RSV testing.  Fact Sheet for Patients: EntrepreneurPulse.com.au  Fact Sheet for Healthcare Providers: IncredibleEmployment.be  This test is not yet approved or cleared by the [place] FDA and has been authorized for detection and/or diagnosis of SARS-CoV-2 by FDA under an Emergency Use Authorization (EUA). This EUA will remain in effect (meaning this test can be used) for the duration of the COVID-19 declaration under Section 564(b)(1) of the Act, 21 U.S.C. section 360bbb-3(b)(1), unless the authorization is terminated or revoked.  Performed at [facility], [place] [address] [place], [place] [address]   MRSA Next Gen by PCR, Nasal     Status: Abnormal   Collection Time: 04/29/21  8:00 PM   Specimen: Nasal Mucosa; Nasal Swab  Result Value Ref Range Status   MRSA by PCR Next Gen DETECTED (A) NOT DETECTED Final    Comment: RESULT CALLED TO, READ BACK BY AND VERIFIED WITH: D HART,RN@0658  04/30/21 [place] (NOTE) The GeneXpert MRSA Assay (FDA approved for NASAL specimens only), is one component of a comprehensive MRSA colonization surveillance program. It is not intended to diagnose MRSA infection nor to guide or monitor treatment for MRSA infections. Test performance is not FDA approved in patients less than 23 years old. Performed  at [facility], [place] [address], [place], [place] [address]        Radiology Studies: CT ABDOMEN PELVIS WO CONTRAST  Result Date: 04/29/2021 CLINICAL DATA:  Lymphadenopathy, groin abdominal pain. EXAM: CT ABDOMEN AND PELVIS WITHOUT CONTRAST TECHNIQUE: Multidetector CT imaging of the abdomen and pelvis was performed following the standard protocol without IV contrast. COMPARISON:  None. FINDINGS: Lower chest: Right basilar atelectasis. Hepatobiliary: No focal liver abnormality is seen. No gallstones, gallbladder wall thickening, or biliary dilatation. Pancreas: Unremarkable. No pancreatic ductal dilatation or surrounding inflammatory changes. Spleen: Normal in size without focal abnormality. Adrenals/Urinary Tract: Adrenal glands are unremarkable. Kidneys are normal, without renal calculi, focal lesion, or hydronephrosis. Simple left renal cysts in the upper and lower pole. Bladder is unremarkable. Stomach/Bowel: Stomach is within normal limits. Appendix appears normal. No evidence of bowel wall thickening, distention, or inflammatory changes. Vascular/Lymphatic: Aortic atherosclerosis. Mildly enlarged bilateral inguinal lymph nodes. No retroperitoneal fibrosis or lymphadenopathy. Reproductive: Prostate is mildly enlarged. Other: There is marked skin thickening of the scrotal sac with bilateral hydroceles. Fat containing umbilical hernia. Musculoskeletal: Mild degenerate disc disease of the lumbar spine. No acute osseous abnormality or suspicious osseous lesion. IMPRESSION: 1. Marked skin thickening of the scrotal sac with bilateral hydroceles concerning for cellulitis. Mildly enlarged bilateral inguinal lymph nodes. No abdominal/pelvic lymphadenopathy. This may be secondary to scrotal infectious/inflammatory process. Clinical correlation is suggested. 2.  No CT evidence of acute abdominal/pelvic process. Electronically Signed   By: Keane Police D.O.   On: 04/29/2021 16:31   DG Chest 2  View  Result Date: 04/29/2021 CLINICAL DATA:  Shortness of breath. Known congestive heart failure. EXAM: CHEST - 2 VIEW COMPARISON:  03/11/2021 FINDINGS: Both views are degraded by patient size. Low lung volumes, especially on the frontal radiograph. Midline trachea. Mild cardiomegaly. Radiopaque densities projecting over the lower chest bilaterally may be external to the patient. Not localized on the frontal. No pleural effusion or pneumothorax. No congestive failure. Bibasilar atelectasis, greater on the right. IMPRESSION: Cardiomegaly and low lung volumes with bibasilar atelectasis. No acute superimposed process. Decreased sensitivity and specificity exam due to technique related factors, as described above. Electronically Signed   By: Adria Devon.D.  On: 04/29/2021 10:55   ECHOCARDIOGRAM COMPLETE  Result Date: 04/30/2021    ECHOCARDIOGRAM REPORT   Patient Name:   Zachary Hawkins Date of Exam: 04/30/2021 Medical Rec #:  [id number]       Height:       73.0 in Accession #:    [id number]      Weight:       332.2 lb Date of Birth:  [birth year]       BSA:          2.669 m Patient Age:    31 years        BP:           106/84 mmHg Patient Gender: M               HR:           77 bpm. Exam Location:  Inpatient Procedure: 2D Echo Indications:    acute diastolic chf  History:        Patient has prior history of Echocardiogram examinations. CHF,                 chronic kidney disease, Arrythmias:Paroxysmal A-fib; Risk                 Factors:Sleep Apnea and Hypertension.  Sonographer:    Johny Chess RDCS Referring Phys: [id number] [place] [facility]  Sonographer Comments: Suboptimal apical window and patient is morbidly obese. Image acquisition challenging due to respiratory motion and Image acquisition challenging due to patient body habitus. IMPRESSIONS  1. Left ventricular ejection fraction, by estimation, is 60 to 65%. The left ventricle has normal function. The left ventricle has no regional wall motion  abnormalities. Left ventricular diastolic parameters were normal.  2. Right ventricular systolic function is normal. The right ventricular size is not well visualized.  3. The mitral valve is grossly normal. No evidence of mitral valve regurgitation. No evidence of mitral stenosis.  4. The aortic valve is grossly normal. Aortic valve regurgitation is not visualized. No aortic stenosis is present.  5. Aortic dilatation noted. There is mild dilatation of the ascending aorta, measuring 39 mm.  6. The inferior vena cava is normal in size with greater than 50% respiratory variability, suggesting right atrial pressure of 3 mmHg. Comparison(s): No significant change from prior study. FINDINGS  Left Ventricle: Left ventricular ejection fraction, by estimation, is 60 to 65%. The left ventricle has normal function. The left ventricle has no regional wall motion abnormalities. Definity contrast agent was given IV to delineate the left ventricular  endocardial borders. The left ventricular internal cavity size was normal in size. There is borderline left ventricular hypertrophy. Left ventricular diastolic parameters were normal. Right Ventricle: The right ventricular size is not well visualized. Right vetricular wall thickness was not well visualized. Right ventricular systolic function is normal. Left Atrium: Left atrial size was normal in size. Right Atrium: Right atrial size was normal in size. Pericardium: There is no evidence of pericardial effusion. Mitral Valve: The mitral valve is grossly normal. No evidence of mitral valve regurgitation. No evidence of mitral valve stenosis. Tricuspid Valve: The tricuspid valve is grossly normal. Tricuspid valve regurgitation is not demonstrated. No evidence of tricuspid stenosis. Aortic Valve: The aortic valve is grossly normal. Aortic valve regurgitation is not visualized. No aortic stenosis is present. Pulmonic Valve: The pulmonic valve was not well visualized. Pulmonic valve  regurgitation is not visualized. Aorta: Aortic dilatation noted. There is mild dilatation of the ascending aorta, measuring  39 mm. Venous: The inferior vena cava is normal in size with greater than 50% respiratory variability, suggesting right atrial pressure of 3 mmHg. IAS/Shunts: The atrial septum is grossly normal.  LEFT VENTRICLE PLAX 2D LVIDd:         4.50 cm   Diastology LVIDs:         3.40 cm   LV e' medial:    6.42 cm/s LV PW:         1.30 cm   LV E/e' medial:  8.9 LVOT diam:     2.10 cm   LV e' lateral:   8.59 cm/s LVOT Area:     3.46 cm  LV E/e' lateral: 6.7  RIGHT VENTRICLE         IVC TAPSE (M-mode): 2.1 cm  IVC diam: 2.00 cm LEFT ATRIUM             Index        RIGHT ATRIUM           Index LA diam:        3.30 cm 1.24 cm/m   RA Area:     14.50 cm LA Vol (A2C):   81.0 ml 30.34 ml/m  RA Volume:   34.80 ml  13.04 ml/m LA Vol (A4C):   53.1 ml 19.89 ml/m LA Biplane Vol: 68.1 ml 25.51 ml/m   AORTA Ao Root diam: 3.10 cm Ao Asc diam:  3.90 cm MITRAL VALVE MV Area (PHT): 3.42 cm    SHUNTS MV Decel Time: 222 msec    Systemic Diam: 2.10 cm MV E velocity: 57.20 cm/s MV A velocity: 67.20 cm/s MV E/A ratio:  0.85 Buford Dresser MD Electronically signed by Buford Dresser MD Signature Date/Time: 04/30/2021/12:43:59 PM    Final    US SCROTUM W/DOPPLER  Result Date: 04/29/2021 CLINICAL DATA:  A 65 year old male presents for evaluation of scrotal pain. EXAM: SCROTAL ULTRASOUND DOPPLER ULTRASOUND OF THE TESTICLES TECHNIQUE: Complete ultrasound examination of the testicles, epididymis, and other scrotal structures was performed. Color and spectral Doppler ultrasound were also utilized to evaluate blood flow to the testicles. COMPARISON:  None FINDINGS: Right testicle Measurements: 4.5 x 3.5 x 2.9 cm. No mass or microlithiasis visualized. Left testicle Measurements: 4.2 x 3.2 x 3.2 cm. No mass or microlithiasis visualized. Right epididymis:  Normal in size and appearance. Left epididymis:  Normal  in size and appearance. Hydrocele: Bilateral large hydroceles. Small areas of echogenic debris without septation within the RIGHT hydrocele. Varicocele: RIGHT-sided varicocele up to 5 mm. No signs of LEFT varicocele. Pulsed Doppler interrogation of both testes demonstrates normal low resistance arterial and venous waveforms bilaterally. IMPRESSION: No signs of testicular torsion. Scrotal wall thickening and bilateral hydroceles, minimally complex on the RIGHT without signs of septation. Scrotal thickening and hydroceles more likely relate to volume overload and or heart failure/anasarca. Would however correlate with any signs of infection over the scrotum. Potential isolated RIGHT varicocele. Attention on upcoming CT of the abdomen and pelvis for any signs of retroperitoneal mass or similar process that would explain and isolated RIGHT varicocele. Electronically Signed   By: Zetta Bills M.D.   On: 04/29/2021 15:50    Scheduled Meds:  amLODipine  5 mg Oral Daily   amoxicillin  500 mg Oral Q8H   apixaban  5 mg Oral BID   atorvastatin  10 mg Oral Daily   famotidine  20 mg Oral BID   fluticasone furoate-vilanterol  1 puff Inhalation Daily   furosemide  80  mg Intravenous BID   isosorbide dinitrate  20 mg Oral TID   mupirocin ointment  1 application Nasal BID   potassium chloride  40 mEq Oral Once   sodium chloride flush  3 mL Intravenous Q12H   sodium chloride flush  3 mL Intravenous Q12H   tamsulosin  0.4 mg Oral QPC supper   Continuous Infusions:  sodium chloride     sodium chloride       LOS: 1 day   Time spent: 30 minutes   Darliss Cheney, MD [facility]  05/01/2021, 8:24 AM  Please page via [place] [place] and do not message via secure chat for anything urgent. Secure chat can be used for anything non urgent.  How to contact the [facility] Attending or Consulting provider [place] or covering provider during after hours [place], for this patient?  Check the care team in [facility] and look for a)  attending/consulting [facility] provider listed and b) the [facility] team listed. Page or secure chat [phone]. Log into [email or web address] and use [place]'s universal password to access. If you do not have the password, please contact the hospital operator. Locate the [facility] provider you are looking for under [facility] and page to a number that you can be directly reached. If you still have difficulty reaching the provider, please page the [facility] (Director on Call) for the Hospitalists listed on amion for assistance.

## 2021-05-01 NOTE — Progress Notes (Signed)
Heart Failure Nurse Navigator Progress Note  Assessed for Heart & Vascular TOC clinic readiness.  Patient does not meet criteria due to follow up with Luther Parody, NP Cardiology @ [facility] scheduled prior to admission for 1/6 @ 8:10AM. Pt had appt with Dr. Salena Saner 12/16, started on metolazone twice weekly. Pt had 5 out of the 8 tablets left in pill bottle. States he gets confused on what medications he should be taking as they change from his bubble pack. Explained to pt bedside RN will review medication plan prior to discharge and will have in written form.  Pt requested a nurse aid to help him bathe as he gets short of breath sometimes, and also wants someone to clean up around the house. Pt already has home O2 set up.   Education Assessment and Provision:  Detailed education and instructions provided on heart failure disease management including the following:  Signs and symptoms of Heart Failure When to call the physician Importance of daily weights Low sodium diet Fluid restriction Medication management Anticipated future follow-up appointments  Patient education given on each of the above topics.  Patient acknowledges understanding via teach back method and acceptance of all instructions.  Education Materials:  "Living Better With Heart Failure" Booklet, HF zone tool, & Daily Weight Tracker Tool.  Patient has scale at home: yes Patient has pill box at home: yes, bubble pack too.     Navigator available for educational resources.   Ozella Rocks, MSN, RN Heart Failure Nurse Navigator [phone]

## 2021-05-01 NOTE — TOC Progression Note (Signed)
Transition of Care [facility]) - Progression Note    Patient Details  Name: Zachary Hawkins MRN: [id number] Date of Birth: 1955/12/24  Transition of Care [facility]) CM/SW Contact  Leone Haven, RN Phone Number: 05/01/2021, 6:06 PM  Clinical Narrative:     Transition of Care [facility]) Screening Note   Patient Details  Name: Zachary Hawkins Date of Birth: 1955-12-31   Transition of Care [employer]) CM/SW Contact:    Leone Haven, RN Phone Number: 05/01/2021, 6:06 PM    Transition of Care Department [facility]) has reviewed patient and no TOC needs have been identified at this time. We will continue to monitor patient advancement through interdisciplinary progression rounds. If new patient transition needs arise, please place a TOC consult.          Expected Discharge Plan and Services                                                 Social Determinants of Health (SDOH) Interventions    Readmission Risk Interventions Readmission Risk Prevention Plan 03/13/2021  Transportation Screening Complete  Medication Review [employer]) Complete  PCP or Specialist appointment within 3-5 days of discharge Complete  HRI or Home Care Consult Complete  SW Recovery Care/Counseling Consult Complete  Palliative Care Screening Not Applicable  Skilled Nursing Facility Complete  Some recent data might be hidden

## 2021-05-01 NOTE — Progress Notes (Signed)
Mobility Specialist Progress Note:   05/01/21 1028  Mobility  Activity Ambulated in hall  Level of Assistance Modified independent, requires aide device or extra time  Assistive Device Front wheel walker  Distance Ambulated (ft) 520 ft  Mobility Ambulated with assistance in hallway  Mobility Response Tolerated well  Mobility performed by Mobility specialist  Bed Position Chair  $Mobility charge 1 Mobility   Pt received in bed willing to participate in mobility. No complaints of pain. Pt returned to chair with call bell in reach and all needs met.   [facility] Public librarian Phone [phone] Secondary Phone [phone]

## 2021-05-02 DIAGNOSIS — N179 Acute kidney failure, unspecified: Secondary | ICD-10-CM | POA: Diagnosis not present

## 2021-05-02 DIAGNOSIS — I5033 Acute on chronic diastolic (congestive) heart failure: Secondary | ICD-10-CM | POA: Diagnosis not present

## 2021-05-02 DIAGNOSIS — I480 Paroxysmal atrial fibrillation: Secondary | ICD-10-CM

## 2021-05-02 LAB — BASIC METABOLIC PANEL
Anion gap: 8 (ref 5–15)
BUN: 34 mg/dL — ABNORMAL HIGH (ref 8–23)
CO2: 45 mmol/L — ABNORMAL HIGH (ref 22–32)
Calcium: 9 mg/dL (ref 8.9–10.3)
Chloride: 83 mmol/L — ABNORMAL LOW (ref 98–111)
Creatinine, Ser: 1.8 mg/dL — ABNORMAL HIGH (ref 0.61–1.24)
GFR, Estimated: 42 mL/min — ABNORMAL LOW (ref >60)
Glucose, Bld: 126 mg/dL — ABNORMAL HIGH (ref 70–99)
Potassium: 3.5 mmol/L (ref 3.5–5.1)
Sodium: 136 mmol/L (ref 135–145)

## 2021-05-02 LAB — MAGNESIUM: Magnesium: 2.3 mg/dL (ref 1.7–2.4)

## 2021-05-02 MED ORDER — POTASSIUM CHLORIDE CRYS ER 20 MEQ PO TBCR
40.0000 meq | EXTENDED_RELEASE_TABLET | Freq: Once | ORAL | Status: AC
  Administered 2021-05-02: 14:00:00 40 meq via ORAL
  Filled 2021-05-02: qty 2

## 2021-05-02 MED ORDER — FUROSEMIDE 40 MG PO TABS
80.0000 mg | ORAL_TABLET | Freq: Two times a day (BID) | ORAL | Status: DC
  Administered 2021-05-02 – 2021-05-04 (×5): 80 mg via ORAL
  Filled 2021-05-02 (×5): qty 2

## 2021-05-02 MED ORDER — ACETAZOLAMIDE 250 MG PO TABS
250.0000 mg | ORAL_TABLET | Freq: Two times a day (BID) | ORAL | Status: DC
  Administered 2021-05-02 (×2): 250 mg via ORAL
  Filled 2021-05-02 (×2): qty 1

## 2021-05-02 NOTE — Progress Notes (Signed)
Mobility Specialist Progress Note    05/02/21 1641  Mobility  Activity Ambulated in hall  Level of Assistance Standby assist, set-up cues, supervision of patient - no hands on  Assistive Device Front wheel walker  Distance Ambulated (ft) 720 ft  Mobility Ambulated with assistance in hallway  Mobility Response Tolerated fair  Mobility performed by Mobility specialist  Bed Position Chair  $Mobility charge 1 Mobility   Pt received sitting EOB and agreeable. Ambulated on 6LO2. Pt SOB upon return to chair. Left with call bell in reach.   [facility] Mobility Specialist  M.S. Primary Phone: [phone] M.S. Secondary Phone: [phone]

## 2021-05-02 NOTE — Progress Notes (Signed)
PROGRESS NOTE    Zachary Hawkins  [id number] DOB: August 06, 1955 DOA: 04/29/2021 PCP: Pcp, No   Brief Narrative:  Zachary Hawkins is a 65 y.o. male with medical history significant of CKD stage IIIa, diastolic and systolic CHF, HTN, atrial fibrillation on eliquis, OSA on CPAP at night (doesn't wear nightly), ACE-I angioedema (10/2017), undocumented COPD, chronic respiratory failure on 4-6L [place] after recent hospital discharge in November of this year who presented to ED with complaints of shortness of breath and swollen testicles that started about 2-3 days ago associated with bilateral lower extremity edema and orthopnea.  He saw Dr. Sallyanne Kuster 04/24/21 and metolazone was added on in the AM before his furosemide twice weekly. He has also been taking ibuprofen for his tooth that was pulled. He has not been compliant with medication and never even picked up the metolazone per cardiology note. He also doesn't wear his oxygen like he is supposed to at home and states when his oxygen was in the 80s today he had taken it off to go to the bathroom and left it off for a bit.    He had recent admit in 03/10/21-03/16/2021 for acute on chronic respiratory failure with pulmonary edema, hx of OSA not treated and suspected COPD with acute exacerbation. He required intubation on 03/11/21 and extubated on 03/12/21. He was discharged on 6L [place] and advised to f/u with pulm which he has done, but has never completed his PFTs. He also was diuresed and weight at time of discharge was 322 pounds. Weight 5 days ago was 345 pounds.   ED Course: vitals: afebrile, bp: 130/72, HR: 78, RR: 18, oxygen: 99% on 3L [place] Pertinent labs: CO2: 38, BUN: 27, creatinine: 1.78 (1.2-1.5), BNP: 31 CXR: cardiomegaly and low lung volumes with bibasilar atelectasis. No acute process.   Assessment & Plan:   Principal Problem:   Acute exacerbation of CHF (congestive heart failure) (HCC) Active Problems:   Essential hypertension   OSA (obstructive  sleep apnea)   Paroxysmal atrial fibrillation (HCC)   AKI (acute kidney injury) (HCC)   Chronic respiratory failure with hypercapnia (HCC)   Bilateral hydrocele   Dyspnea on exertion   Acute on chronic diastolic (congestive) heart failure (HCC)  Chronic respiratory failure with hypoxia/acute on chronic diastolic congestive heart failure: Presented with 20+ pound weight gain with increased LE swelling and scrotal swelling. Dry weight around 322 (was 322 on hospital d/c 03/16/21. 345lbs on the day of admission) Last echo in 01/2021 showed EF to 60-65% and grade 1 diastolic dysfunction.  Repeat echo shows normal ejection fraction, no wall motion abnormality.  Still on 5 L oxygen which is his baseline.  Decent diuresis again in last 24 hours with 3.6 L and now he is net -6.3 L.  Feels better.  Audiology has transition him to oral Lasix now.  Appreciate cardiology help and defer further management to them.  Metabolic alkalosis: Likely secondary to diuresis.  Discussed with cardiology, will start him on Diamox to 50 mg twice daily.  AKI on CKD stage IIIa: Baseline creatinine anywhere between 1.1-1.4.  Presented with 1.78 and peaked at 2.21, but has remained stable around 1.8 since last 2 days . likely cardiorenal/poor perfusion to kidneys.  Noncompliant with medications.  Never picked up metolazone.  Has also been taking NSAIDs after having his tooth pulled.  Continue to avoid nephrotoxic agents.  Hypokalemia: Resolved but borderline, will replace again today.  Bilateral hydroceles: Likely due to volume overload. Physical exam not consistent  with cellulitis, no abx at this time. isolated right varicocele on scrotal US.   Paroxysmal atrial fibrillation (HCC) In NSR, rate controlled, continue telemetry  CHaDSVasc score of 2, continue his eliquis for [facility] Continue to hold bisprolol in setting of acute on chronic CHF    Essential hypertension: Very well controlled.  Continue Isordil and amlodipine (dose  decreased to 5 mg by ) but hold Zebeta and home dose of hydralazine while he is on IV diuretics.  OSA (obstructive sleep apnea): not very compliant, wearing about 2-3 hours at a time.  Nightly CPAP.   Undocumented COPD: saw pulmonology 1x and has never followed up for PFTs.  Appears to be stable.   Tooth abscess with recent extraction  Continue amoxicillin   HLD: Continue atorvastatin.  BPH: Continue Flomax  DVT prophylaxis: Place TED hose Start: 04/30/21 1211   Code Status: Full Code  Family Communication:  None present at bedside.  Plan of care discussed with patient in length and he verbalized understanding and agreed with it.  Status is: Inpatient  Remains inpatient appropriate because: Needs IV diuresis.  Estimated body mass index is 43.12 kg/m as calculated from the following:   Height as of 04/24/21: 6\' 1"  (1.854 m).   Weight as of this encounter: 148.2 kg.   Nutritional Assessment: Body mass index is 43.12 kg/m. Seen by dietician.  I agree with the assessment and plan as outlined below: Nutrition Status:   Skin Assessment: I have examined the patient's skin and I agree with the wound assessment as performed by the wound care RN as outlined below:    Consultants:  Cardiology  Procedures:  None  Antimicrobials:  Anti-infectives (From admission, onward)    Start     Dose/Rate Route Frequency Ordered Stop   04/29/21 2200  amoxicillin (AMOXIL) capsule 500 mg        500 mg Oral Every 8 hours 04/29/21 2028            Subjective:  Seen and examined.  Continues to improve.  He is grateful.  No other complaint.  Objective: Vitals:   05/01/21 2042 05/01/21 2302 05/02/21 0322 05/02/21 0749  BP: (!) 118/107  (!) 141/61   Pulse: 95 90 98   Resp: 18  18   Temp: 97.8 F (36.6 C)  (!) 97.4 F (36.3 C)   TempSrc: Oral  Oral   SpO2: (!) 87% 92% 92% 92%  Weight:   (!) 148.2 kg     Intake/Output Summary (Last 24 hours) at 05/02/2021 1234 Last data filed  at 05/02/2021 05/04/2021 Gross per 24 hour  Intake 720 ml  Output 2500 ml  Net -1780 ml    Filed Weights   04/30/21 0300 05/01/21 0408 05/02/21 0322  Weight: (!) 150.7 kg (!) 150.4 kg (!) 148.2 kg    Examination:  General exam: Appears calm and comfortable, morbidly obese Respiratory system: Diminished breath sounds due to body habitus. Respiratory effort normal. Cardiovascular system: S1 & S2 heard, RRR. No JVD, murmurs, rubs, gallops or clicks.  +1 pitting edema bilateral lower extremity Gastrointestinal system: Abdomen is nondistended, soft and nontender. No organomegaly or masses felt. Normal bowel sounds heard.  Moderate scrotal edema Central nervous system: Alert and oriented. No focal neurological deficits. Extremities: Symmetric 5 x 5 power. Skin: No rashes, lesions or ulcers.  Psychiatry: Judgement and insight appear poor  Data Reviewed: I have personally reviewed following labs and imaging studies  CBC: Recent Labs  Lab 04/29/21 1010 04/30/21 0206  WBC 7.3 7.7  HGB 14.1 14.1  HCT 46.1 46.1  MCV 97.5 96.6  PLT 253 0000000    Basic Metabolic Panel: Recent Labs  Lab 04/29/21 1010 04/29/21 1946 04/30/21 0206 05/01/21 0218 05/02/21 0443  NA 138  --  136 136 136  K 4.1  --  4.2 3.4* 3.5  CL 91*  --  86* 83* 83*  CO2 38*  --  39* 45* 45*  GLUCOSE 132*  --  144* 116* 126*  BUN 27*  --  36* 34* 34*  CREATININE 1.78*  --  2.21* 1.83* 1.80*  CALCIUM 9.5  --  9.2 8.9 9.0  MG  --  2.1  --   --  2.3    GFR: Estimated Creatinine Clearance: 63.7 mL/min (A) (by C-G formula based on SCr of 1.8 mg/dL (H)). Liver Function Tests: No results for input(s): AST, ALT, ALKPHOS, BILITOT, PROT, ALBUMIN in the last 168 hours. No results for input(s): LIPASE, AMYLASE in the last 168 hours. No results for input(s): AMMONIA in the last 168 hours. Coagulation Profile: No results for input(s): INR, PROTIME in the last 168 hours. Cardiac Enzymes: No results for input(s): CKTOTAL, CKMB,  CKMBINDEX, TROPONINI in the last 168 hours. BNP (last 3 results) No results for input(s): PROBNP in the last 8760 hours. HbA1C: No results for input(s): HGBA1C in the last 72 hours. CBG: No results for input(s): GLUCAP in the last 168 hours. Lipid Profile: No results for input(s): CHOL, HDL, LDLCALC, TRIG, CHOLHDL, LDLDIRECT in the last 72 hours. Thyroid Function Tests: Recent Labs    04/29/21 1802  TSH 0.962    Anemia Panel: No results for input(s): VITAMINB12, FOLATE, FERRITIN, TIBC, IRON, RETICCTPCT in the last 72 hours. Sepsis Labs: No results for input(s): PROCALCITON, LATICACIDVEN in the last 168 hours.  Recent Results (from the past 240 hour(s))  Resp Panel by RT-PCR (Flu A&B, Covid) Nasopharyngeal Swab     Status: None   Collection Time: 04/29/21  3:51 PM   Specimen: Nasopharyngeal Swab; Nasopharyngeal(NP) swabs in vial transport medium  Result Value Ref Range Status   SARS Coronavirus 2 by RT PCR NEGATIVE NEGATIVE Final    Comment: (NOTE) SARS-CoV-2 target nucleic acids are NOT DETECTED.  The SARS-CoV-2 RNA is generally detectable in upper respiratory specimens during the acute phase of infection. The lowest concentration of SARS-CoV-2 viral copies this assay can detect is 138 copies/mL. A negative result does not preclude SARS-Cov-2 infection and should not be used as the sole basis for treatment or other patient management decisions. A negative result may occur with  improper specimen collection/handling, submission of specimen other than nasopharyngeal swab, presence of viral mutation(s) within the areas targeted by this assay, and inadequate number of viral copies(<138 copies/mL). A negative result must be combined with clinical observations, patient history, and epidemiological information. The expected result is Negative.  Fact Sheet for Patients:  EntrepreneurPulse.com.au  Fact Sheet for Healthcare Providers:   IncredibleEmployment.be  This test is no t yet approved or cleared by the [place] FDA and  has been authorized for detection and/or diagnosis of SARS-CoV-2 by FDA under an Emergency Use Authorization (EUA). This EUA will remain  in effect (meaning this test can be used) for the duration of the COVID-19 declaration under Section 564(b)(1) of the Act, 21 U.S.C.section 360bbb-3(b)(1), unless the authorization is terminated  or revoked sooner.       Influenza A by PCR NEGATIVE NEGATIVE Final   Influenza B by PCR NEGATIVE  NEGATIVE Final    Comment: (NOTE) The Xpert Xpress SARS-CoV-2/FLU/RSV plus assay is intended as an aid in the diagnosis of influenza from Nasopharyngeal swab specimens and should not be used as a sole basis for treatment. Nasal washings and aspirates are unacceptable for Xpert Xpress SARS-CoV-2/FLU/RSV testing.  Fact Sheet for Patients: EntrepreneurPulse.com.au  Fact Sheet for Healthcare Providers: IncredibleEmployment.be  This test is not yet approved or cleared by the [place] FDA and has been authorized for detection and/or diagnosis of SARS-CoV-2 by FDA under an Emergency Use Authorization (EUA). This EUA will remain in effect (meaning this test can be used) for the duration of the COVID-19 declaration under Section 564(b)(1) of the Act, 21 U.S.C. section 360bbb-3(b)(1), unless the authorization is terminated or revoked.  Performed at [facility], [place] [address] [place], [place] [address]   MRSA Next Gen by PCR, Nasal     Status: Abnormal   Collection Time: 04/29/21  8:00 PM   Specimen: Nasal Mucosa; Nasal Swab  Result Value Ref Range Status   MRSA by PCR Next Gen DETECTED (A) NOT DETECTED Final    Comment: RESULT CALLED TO, READ BACK BY AND VERIFIED WITH: D HART,RN@[id number]  04/30/21 [place] (NOTE) The GeneXpert MRSA Assay (FDA approved for NASAL specimens only), is one component of a  comprehensive MRSA colonization surveillance program. It is not intended to diagnose MRSA infection nor to guide or monitor treatment for MRSA infections. Test performance is not FDA approved in patients less than 37 years old. Performed at [facility], [place] [address], [place], [place] [address]        Radiology Studies: No results found.  Scheduled Meds:  acetaZOLAMIDE  250 mg Oral BID   amLODipine  5 mg Oral Daily   amoxicillin  500 mg Oral Q8H   apixaban  5 mg Oral BID   atorvastatin  10 mg Oral Daily   famotidine  20 mg Oral BID   fluticasone furoate-vilanterol  1 puff Inhalation Daily   furosemide  80 mg Oral BID   isosorbide dinitrate  20 mg Oral TID   mupirocin ointment  1 application Nasal BID   sodium chloride flush  3 mL Intravenous Q12H   sodium chloride flush  3 mL Intravenous Q12H   tamsulosin  0.4 mg Oral QPC supper   Continuous Infusions:  sodium chloride     sodium chloride       LOS: 2 days   Time spent: 28 minutes   Darliss Cheney, MD [facility]  05/02/2021, 12:34 PM  Please page via [place] [place] and do not message via secure chat for anything urgent. Secure chat can be used for anything non urgent.  How to contact the [facility] Attending or Consulting provider [place] or covering provider during after hours [place], for this patient?  Check the care team in [facility] and look for a) attending/consulting TRH provider listed and b) the [facility] team listed. Page or secure chat [phone]. Log into [email or web address] and use [place]'s universal password to access. If you do not have the password, please contact the hospital operator. Locate the [facility] provider you are looking for under [facility] and page to a number that you can be directly reached. If you still have difficulty reaching the provider, please page the [facility] (Director on Call) for the Hospitalists listed on amion for assistance.

## 2021-05-02 NOTE — Progress Notes (Signed)
Patient was placed on CPAP per respiratory therapy and within ten minutes calling out to desk to be taken off of CPAP and to be placed back on nasal canula.

## 2021-05-02 NOTE — Progress Notes (Addendum)
Progress Note  Patient Name: Zachary Hawkins Date of Encounter: 05/02/2021  Primary Cardiologist:   Sanda Klein, MD   Subjective   Net -2.3 L yesterday, -6.3 L on admission.  Stable creatinine (1.83 > 1.8).  BP 141/61 this morning.  Weight is down 5 pounds from yesterday.  Reports breathing is improved, states that he feels about back to his baseline  Inpatient Medications    Scheduled Meds:  amLODipine  5 mg Oral Daily   amoxicillin  500 mg Oral Q8H   apixaban  5 mg Oral BID   atorvastatin  10 mg Oral Daily   famotidine  20 mg Oral BID   fluticasone furoate-vilanterol  1 puff Inhalation Daily   furosemide  80 mg Intravenous BID   isosorbide dinitrate  20 mg Oral TID   mupirocin ointment  1 application Nasal BID   sodium chloride flush  3 mL Intravenous Q12H   sodium chloride flush  3 mL Intravenous Q12H   tamsulosin  0.4 mg Oral QPC supper   Continuous Infusions:  sodium chloride     sodium chloride     PRN Meds: sodium chloride, sodium chloride, acetaminophen **OR** acetaminophen, albuterol, HYDROcodone-acetaminophen, sodium chloride, sodium chloride flush, sodium chloride flush   Vital Signs    Vitals:   05/01/21 0744 05/01/21 2042 05/01/21 2302 05/02/21 0322  BP: 116/84 (!) 118/107  (!) 141/61  Pulse: 94 95 90 98  Resp:  18  18  Temp: 98.1 F (36.7 C) 97.8 F (36.6 C)  (!) 97.4 F (36.3 C)  TempSrc: Oral Oral  Oral  SpO2: 95% (!) 87% 92% 92%  Weight:    (!) 148.2 kg    Intake/Output Summary (Last 24 hours) at 05/02/2021 0704 Last data filed at 05/02/2021 [id number] Gross per 24 hour  Intake 1320 ml  Output 3650 ml  Net -2330 ml    Filed Weights   04/30/21 0300 05/01/21 0408 05/02/21 0322  Weight: (!) 150.7 kg (!) 150.4 kg (!) 148.2 kg    Telemetry    NSR with rate 80s to 100s- Personally Reviewed  ECG    NA - Personally Reviewed  Physical Exam   GEN: No  acute distress.   Neck: No  JVD appreciated Cardiac: RRR, no murmurs, rubs, or  gallops.  Respiratory: Diminished breath sounds GI: Soft, nontender, non-distended, normal bowel sounds  MS: Trace edema Neuro:   Nonfocal  Psych: Oriented and appropriate     Labs    Chemistry Recent Labs  Lab 04/30/21 0206 05/01/21 0218 05/02/21 0443  NA 136 136 136  K 4.2 3.4* 3.5  CL 86* 83* 83*  CO2 39* 45* 45*  GLUCOSE 144* 116* 126*  BUN 36* 34* 34*  CREATININE 2.21* 1.83* 1.80*  CALCIUM 9.2 8.9 9.0  GFRNONAA 33* 41* 42*  ANIONGAP 11 8 8       Hematology Recent Labs  Lab 04/29/21 1010 04/30/21 0206  WBC 7.3 7.7  RBC 4.73 4.77  HGB 14.1 14.1  HCT 46.1 46.1  MCV 97.5 96.6  MCH 29.8 29.6  MCHC 30.6 30.6  RDW 14.6 14.6  PLT 253 243     Cardiac EnzymesNo results for input(s): TROPONINI in the last 168 hours. No results for input(s): TROPIPOC in the last 168 hours.   BNP Recent Labs  Lab 04/29/21 1010  BNP 31.0      DDimer No results for input(s): DDIMER in the last 168 hours.   Radiology    ECHOCARDIOGRAM COMPLETE  Result Date: 04/30/2021    ECHOCARDIOGRAM REPORT   Patient Name:   [name] Date of Exam: 04/30/2021 Medical Rec #:  [id number]       Height:       73.0 in Accession #:    [id number]      Weight:       332.2 lb Date of Birth:  Sep 25, 1955       BSA:          2.669 m Patient Age:    65 years        BP:           106/84 mmHg Patient Gender: M               HR:           77 bpm. Exam Location:  Inpatient Procedure: 2D Echo Indications:    acute diastolic chf  History:        Patient has prior history of Echocardiogram examinations. CHF,                 chronic kidney disease, Arrythmias:Paroxysmal A-fib; Risk                 Factors:Sleep Apnea and Hypertension.  Sonographer:    Delcie Roch RDCS Referring Phys: [id number] [facility] NICOLE [facility]  Sonographer Comments: Suboptimal apical window and patient is morbidly obese. Image acquisition challenging due to respiratory motion and Image acquisition challenging due to patient body habitus.  IMPRESSIONS  1. Left ventricular ejection fraction, by estimation, is 60 to 65%. The left ventricle has normal function. The left ventricle has no regional wall motion abnormalities. Left ventricular diastolic parameters were normal.  2. Right ventricular systolic function is normal. The right ventricular size is not well visualized.  3. The mitral valve is grossly normal. No evidence of mitral valve regurgitation. No evidence of mitral stenosis.  4. The aortic valve is grossly normal. Aortic valve regurgitation is not visualized. No aortic stenosis is present.  5. Aortic dilatation noted. There is mild dilatation of the ascending aorta, measuring 39 mm.  6. The inferior vena cava is normal in size with greater than 50% respiratory variability, suggesting right atrial pressure of 3 mmHg. Comparison(s): No significant change from prior study. FINDINGS  Left Ventricle: Left ventricular ejection fraction, by estimation, is 60 to 65%. The left ventricle has normal function. The left ventricle has no regional wall motion abnormalities. Definity contrast agent was given IV to delineate the left ventricular  endocardial borders. The left ventricular internal cavity size was normal in size. There is borderline left ventricular hypertrophy. Left ventricular diastolic parameters were normal. Right Ventricle: The right ventricular size is not well visualized. Right vetricular wall thickness was not well visualized. Right ventricular systolic function is normal. Left Atrium: Left atrial size was normal in size. Right Atrium: Right atrial size was normal in size. Pericardium: There is no evidence of pericardial effusion. Mitral Valve: The mitral valve is grossly normal. No evidence of mitral valve regurgitation. No evidence of mitral valve stenosis. Tricuspid Valve: The tricuspid valve is grossly normal. Tricuspid valve regurgitation is not demonstrated. No evidence of tricuspid stenosis. Aortic Valve: The aortic valve is  grossly normal. Aortic valve regurgitation is not visualized. No aortic stenosis is present. Pulmonic Valve: The pulmonic valve was not well visualized. Pulmonic valve regurgitation is not visualized. Aorta: Aortic dilatation noted. There is mild dilatation of the ascending aorta, measuring 39 mm. Venous: The inferior vena cava is  normal in size with greater than 50% respiratory variability, suggesting right atrial pressure of 3 mmHg. IAS/Shunts: The atrial septum is grossly normal.  LEFT VENTRICLE PLAX 2D LVIDd:         4.50 cm   Diastology LVIDs:         3.40 cm   LV e' medial:    6.42 cm/s LV PW:         1.30 cm   LV E/e' medial:  8.9 LVOT diam:     2.10 cm   LV e' lateral:   8.59 cm/s LVOT Area:     3.46 cm  LV E/e' lateral: 6.7  RIGHT VENTRICLE         IVC TAPSE (M-mode): 2.1 cm  IVC diam: 2.00 cm LEFT ATRIUM             Index        RIGHT ATRIUM           Index LA diam:        3.30 cm 1.24 cm/m   RA Area:     14.50 cm LA Vol (A2C):   81.0 ml 30.34 ml/m  RA Volume:   34.80 ml  13.04 ml/m LA Vol (A4C):   53.1 ml 19.89 ml/m LA Biplane Vol: 68.1 ml 25.51 ml/m   AORTA Ao Root diam: 3.10 cm Ao Asc diam:  3.90 cm MITRAL VALVE MV Area (PHT): 3.42 cm    SHUNTS MV Decel Time: 222 msec    Systemic Diam: 2.10 cm MV E velocity: 57.20 cm/s MV A velocity: 67.20 cm/s MV E/A ratio:  0.85 Buford Dresser MD Electronically signed by Buford Dresser MD Signature Date/Time: 04/30/2021/12:43:59 PM    Final     Cardiac Studies   ECHO 04/30/21:  1. Left ventricular ejection fraction, by estimation, is 60 to 65%. The  left ventricle has normal function. The left ventricle has no regional  wall motion abnormalities. Left ventricular diastolic parameters were  normal.   2. Right ventricular systolic function is normal. The right ventricular  size is not well visualized.   3. The mitral valve is grossly normal. No evidence of mitral valve  regurgitation. No evidence of mitral stenosis.   4. The aortic  valve is grossly normal. Aortic valve regurgitation is not  visualized. No aortic stenosis is present.   5. Aortic dilatation noted. There is mild dilatation of the ascending  aorta, measuring 39 mm.   6. The inferior vena cava is normal in size with greater than 50%  respiratory variability, suggesting right atrial pressure of 3 mmHg.   Patient Profile     65 y.o. 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 Rogers Blocker.  Assessment & Plan    Acute on chronic diastolic heart failure: Improved with IV diuresis.  Net -6 L on admission.  Trace edema on exam today.  IVC small/collapsible on echo yesterday.  Rising bicarb.  Suspect approaching euvolemia, will transition to home p.o. Lasix 80 mg twice daily   PAF:  NSR . On DOAC  Hypertension:    On Norvasc, Isordil   Acute on chronic kidney disease stage III:    Stable creatinine today.  Continue to monitor   For  questions or updates, please contact [place] Please consult [email or web address] for contact info under Cardiology/STEMI.   Signed, Donato Heinz, MD  05/02/2021, 7:04 AM

## 2021-05-03 ENCOUNTER — Other Ambulatory Visit: Payer: Self-pay | Admitting: Physician Assistant

## 2021-05-03 DIAGNOSIS — J9612 Chronic respiratory failure with hypercapnia: Secondary | ICD-10-CM

## 2021-05-03 DIAGNOSIS — I5033 Acute on chronic diastolic (congestive) heart failure: Secondary | ICD-10-CM

## 2021-05-03 DIAGNOSIS — I480 Paroxysmal atrial fibrillation: Secondary | ICD-10-CM | POA: Diagnosis not present

## 2021-05-03 DIAGNOSIS — N179 Acute kidney failure, unspecified: Secondary | ICD-10-CM | POA: Diagnosis not present

## 2021-05-03 LAB — BASIC METABOLIC PANEL
Anion gap: 11 (ref 5–15)
BUN: 34 mg/dL — ABNORMAL HIGH (ref 8–23)
CO2: 40 mmol/L — ABNORMAL HIGH (ref 22–32)
Calcium: 8.9 mg/dL (ref 8.9–10.3)
Chloride: 85 mmol/L — ABNORMAL LOW (ref 98–111)
Creatinine, Ser: 1.82 mg/dL — ABNORMAL HIGH (ref 0.61–1.24)
GFR, Estimated: 41 mL/min — ABNORMAL LOW (ref >60)
Glucose, Bld: 123 mg/dL — ABNORMAL HIGH (ref 70–99)
Potassium: 3.4 mmol/L — ABNORMAL LOW (ref 3.5–5.1)
Sodium: 136 mmol/L (ref 135–145)

## 2021-05-03 LAB — MAGNESIUM: Magnesium: 2.4 mg/dL (ref 1.7–2.4)

## 2021-05-03 MED ORDER — POTASSIUM CHLORIDE CRYS ER 20 MEQ PO TBCR
40.0000 meq | EXTENDED_RELEASE_TABLET | Freq: Once | ORAL | Status: AC
  Administered 2021-05-03: 09:00:00 40 meq via ORAL
  Filled 2021-05-03: qty 2

## 2021-05-03 MED ORDER — SPIRONOLACTONE 25 MG PO TABS
25.0000 mg | ORAL_TABLET | Freq: Every day | ORAL | Status: DC
  Administered 2021-05-03 – 2021-05-04 (×2): 25 mg via ORAL
  Filled 2021-05-03 (×2): qty 1

## 2021-05-03 MED ORDER — ACETAZOLAMIDE 250 MG PO TABS
500.0000 mg | ORAL_TABLET | Freq: Two times a day (BID) | ORAL | Status: DC
  Administered 2021-05-03 – 2021-05-04 (×3): 500 mg via ORAL
  Filled 2021-05-03 (×3): qty 2

## 2021-05-03 MED ORDER — BISOPROLOL FUMARATE 5 MG PO TABS
5.0000 mg | ORAL_TABLET | Freq: Every day | ORAL | Status: DC
  Administered 2021-05-03 – 2021-05-04 (×2): 5 mg via ORAL
  Filled 2021-05-03 (×2): qty 1

## 2021-05-03 NOTE — Progress Notes (Addendum)
Progress Note  Patient Name: Zachary Hawkins Date of Encounter: 05/03/2021  Primary Cardiologist:   Thurmon Fair, MD   Subjective   Net -1.7L yesterday, -8 L on admission.  Stable creatinine (1.83 > 1.8>1.8).  BP 112/92 this morning.  Weight is down 2 pounds from yesterday, down 7lbs from admission.  Reports breathing feels back to baseline  Inpatient Medications    Scheduled Meds:  acetaZOLAMIDE  250 mg Oral BID   amLODipine  5 mg Oral Daily   amoxicillin  500 mg Oral Q8H   apixaban  5 mg Oral BID   atorvastatin  10 mg Oral Daily   famotidine  20 mg Oral BID   fluticasone furoate-vilanterol  1 puff Inhalation Daily   furosemide  80 mg Oral BID   isosorbide dinitrate  20 mg Oral TID   mupirocin ointment  1 application Nasal BID   sodium chloride flush  3 mL Intravenous Q12H   sodium chloride flush  3 mL Intravenous Q12H   tamsulosin  0.4 mg Oral QPC supper   Continuous Infusions:  sodium chloride     sodium chloride     PRN Meds: sodium chloride, sodium chloride, acetaminophen **OR** acetaminophen, albuterol, HYDROcodone-acetaminophen, sodium chloride, sodium chloride flush, sodium chloride flush   Vital Signs    Vitals:   05/02/21 0322 05/02/21 0749 05/02/21 1941 05/03/21 0452  BP: (!) 141/61  118/65 (!) 112/92  Pulse: 98  88 97  Resp: 18  19 20   Temp: (!) 97.4 F (36.3 C)  98 F (36.7 C) 97.8 F (36.6 C)  TempSrc: Oral  Oral Oral  SpO2: 92% 92% 95% 93%  Weight: (!) 148.2 kg   (!) 147.6 kg    Intake/Output Summary (Last 24 hours) at 05/03/2021 0734 Last data filed at 05/03/2021 0600 Gross per 24 hour  Intake 960 ml  Output 2650 ml  Net -1690 ml    Filed Weights   05/01/21 0408 05/02/21 0322 05/03/21 0452  Weight: (!) 150.4 kg (!) 148.2 kg (!) 147.6 kg    Telemetry    NSR with rate 90s- Personally Reviewed  ECG    NA - Personally Reviewed  Physical Exam   GEN: No  acute distress.   Neck: No  JVD appreciated Cardiac: RRR, no murmurs,  rubs, or gallops.  Respiratory: CTA B GI: Soft, nontender, non-distended, normal bowel sounds  MS: No edema Neuro:   Nonfocal  Psych: Oriented and appropriate     Labs    Chemistry Recent Labs  Lab 05/01/21 0218 05/02/21 0443 05/03/21 0121  NA 136 136 136  K 3.4* 3.5 3.4*  CL 83* 83* 85*  CO2 45* 45* 40*  GLUCOSE 116* 126* 123*  BUN 34* 34* 34*  CREATININE 1.83* 1.80* 1.82*  CALCIUM 8.9 9.0 8.9  GFRNONAA 41* 42* 41*  ANIONGAP 8 8 11       Hematology Recent Labs  Lab 04/29/21 1010 04/30/21 0206  WBC 7.3 7.7  RBC 4.73 4.77  HGB 14.1 14.1  HCT 46.1 46.1  MCV 97.5 96.6  MCH 29.8 29.6  MCHC 30.6 30.6  RDW 14.6 14.6  PLT 253 243     Cardiac EnzymesNo results for input(s): TROPONINI in the last 168 hours. No results for input(s): TROPIPOC in the last 168 hours.   BNP Recent Labs  Lab 04/29/21 1010  BNP 31.0      DDimer No results for input(s): DDIMER in the last 168 hours.   Radiology  No results found.  Cardiac Studies   ECHO 04/30/21:  1. Left ventricular ejection fraction, by estimation, is 60 to 65%. The  left ventricle has normal function. The left ventricle has no regional  wall motion abnormalities. Left ventricular diastolic parameters were  normal.   2. Right ventricular systolic function is normal. The right ventricular  size is not well visualized.   3. The mitral valve is grossly normal. No evidence of mitral valve  regurgitation. No evidence of mitral stenosis.   4. The aortic valve is grossly normal. Aortic valve regurgitation is not  visualized. No aortic stenosis is present.   5. Aortic dilatation noted. There is mild dilatation of the ascending  aorta, measuring 39 mm.   6. The inferior vena cava is normal in size with greater than 50%  respiratory variability, suggesting right atrial pressure of 3 mmHg.   Patient Profile     65 y.o. male  with a hx of chronic respiratory failure on 3-4 L home O2, OSA not 100% compliant on  CPAP, dilated cardiomyopathy (EF 40-45% 2019, improved to 60-65% 2022, hx of smoking, CKD stage III, HTN, and PAF who is being seen 04/29/2021 for the evaluation of CHF exacerbation at the request of Dr. Rogers Blocker.  Assessment & Plan    Acute on chronic diastolic heart failure: Improved with IV diuresis.  Net -8 L on admission.  Appears euvolemic IVC small/collapsible on echo yesterday.   -Continue PO lasix 80 mg BID -Alkalosis improved, will hold off on further Diamox   PAF:  NSR. On DOAC, bisoprolol  Hypertension:    On Norvasc, Isordil, bisoprolol.  Will restart home spironolactone   Acute on chronic kidney disease stage III:    Stable creatinine today.    [facility] will sign off.   Medication Recommendations: Lasix 80 mg twice daily, bisoprolol 5 mg daily, spironolactone 25 mg daily, amlodipine 5 mg daily, Eliquis 5 mg twice daily, potassium  Other recommendations (labs, testing, etc):  BMET within 1 week Follow up as an outpatient:  Scheduled for 05/15/21   For questions or updates, please contact [place] Please consult [email or web address] for contact info under Cardiology/STEMI.   Signed, Donato Heinz, MD  05/03/2021, 7:34 AM

## 2021-05-03 NOTE — Progress Notes (Signed)
PROGRESS NOTE    Zachary Hawkins  [id number] DOB: 1955/11/22 DOA: 04/29/2021 PCP: Pcp, No   Brief Narrative:  Zachary Hawkins is a 65 y.o. male with medical history significant of CKD stage IIIa, diastolic and systolic CHF, HTN, atrial fibrillation on eliquis, OSA on CPAP at night (doesn't wear nightly), ACE-I angioedema (10/2017), undocumented COPD, chronic respiratory failure on 4-6L [place] after recent hospital discharge in November of this year who presented to ED with complaints of shortness of breath and swollen testicles that started about 2-3 days ago associated with bilateral lower extremity edema and orthopnea.  He saw Dr. Sallyanne Kuster 04/24/21 and metolazone was added on in the AM before his furosemide twice weekly. He has also been taking ibuprofen for his tooth that was pulled. He has not been compliant with medication and never even picked up the metolazone per cardiology note. He also doesn't wear his oxygen like he is supposed to at home and states when his oxygen was in the 80s today he had taken it off to go to the bathroom and left it off for a bit.    He had recent admit in 03/10/21-03/16/2021 for acute on chronic respiratory failure with pulmonary edema, hx of OSA not treated and suspected COPD with acute exacerbation. He required intubation on 03/11/21 and extubated on 03/12/21. He was discharged on 6L [place] and advised to f/u with pulm which he has done, but has never completed his PFTs. He also was diuresed and weight at time of discharge was 322 pounds. Weight 5 days ago was 345 pounds.   ED Course: vitals: afebrile, bp: 130/72, HR: 78, RR: 18, oxygen: 99% on 3L [place] Pertinent labs: CO2: 38, BUN: 27, creatinine: 1.78 (1.2-1.5), BNP: 31 CXR: cardiomegaly and low lung volumes with bibasilar atelectasis. No acute process.   Assessment & Plan:   Principal Problem:   Acute exacerbation of CHF (congestive heart failure) (HCC) Active Problems:   Essential hypertension   OSA (obstructive  sleep apnea)   Paroxysmal atrial fibrillation (HCC)   AKI (acute kidney injury) (HCC)   Chronic respiratory failure with hypercapnia (HCC)   Bilateral hydrocele   Dyspnea on exertion   Acute on chronic diastolic (congestive) heart failure (HCC)  Chronic respiratory failure with hypoxia/acute on chronic diastolic congestive heart failure: Presented with 20+ pound weight gain with increased LE swelling and scrotal swelling. Dry weight around 322 (was 322 on hospital d/c 03/16/21. 345lbs on the day of admission) Last echo in 01/2021 showed EF to 60-65% and grade 1 diastolic dysfunction.  Repeat echo shows normal ejection fraction, no wall motion abnormality.  Still on 5 L oxygen which is his baseline.  Decent diuresis again in last 24 hours with 2.6 L and now he is net -8 L.  Feels better.  Continue oral Lasix.  Scrotal edema improving.  Metabolic alkalosis: Likely secondary to diuresis.  CO2 improved.  We will give him Diamox again today.  AKI on CKD stage IIIa: Baseline creatinine anywhere between 1.1-1.4.  Presented with 1.78 and peaked at 2.21, but has remained stable around 1.8 since last 3 days . likely cardiorenal/poor perfusion to kidneys but now it appears that this might be his new baseline and if so, this will be CKD stage IIIb.  Noncompliant with medications.  Never picked up metolazone.  Has also been taking NSAIDs after having his tooth pulled.  Continue to avoid nephrotoxic agents.  Hypokalemia: Low again, will replace.  Bilateral hydroceles: Likely due to volume overload. Physical  exam not consistent with cellulitis, no abx at this time. isolated right varicocele on scrotal US.   Paroxysmal atrial fibrillation (HCC) In NSR, rate controlled, continue telemetry  CHaDSVasc score of 2, continue his eliquis for [facility] Continue to hold bisprolol in setting of acute on chronic CHF    Essential hypertension: Very well controlled.  Continue Isordil and amlodipine (dose decreased to 5 mg by )  but hold Zebeta and home dose of hydralazine while he is on IV diuretics.  OSA (obstructive sleep apnea): not very compliant, wearing about 2-3 hours at a time.  Nightly CPAP.   Undocumented COPD: saw pulmonology 1x and has never followed up for PFTs.  Appears to be stable.   Tooth abscess with recent extraction  Continue amoxicillin   HLD: Continue atorvastatin.  BPH: Continue Flomax  DVT prophylaxis: Place TED hose Start: 04/30/21 1211   Code Status: Full Code  Family Communication:  None present at bedside.  Plan of care discussed with patient in length and he verbalized understanding and agreed with it.  Status is: Inpatient  Remains inpatient appropriate because: Needs IV diuresis.  Estimated body mass index is 42.93 kg/m as calculated from the following:   Height as of 04/24/21: 6\' 1"  (1.854 m).   Weight as of this encounter: 147.6 kg.   Nutritional Assessment: Body mass index is 42.93 kg/m.Marland Kitchen Seen by dietician.  I agree with the assessment and plan as outlined below: Nutrition Status:   Skin Assessment: I have examined the patient's skin and I agree with the wound assessment as performed by the wound care RN as outlined below:    Consultants:  Cardiology  Procedures:  None  Antimicrobials:  Anti-infectives (From admission, onward)    Start     Dose/Rate Route Frequency Ordered Stop   04/29/21 2200  amoxicillin (AMOXIL) capsule 500 mg        500 mg Oral Every 8 hours 04/29/21 2028            Subjective:  Seen and examined.  No complaints.  He is happy that his scrotal edema is improving.  Objective: Vitals:   05/02/21 1941 05/03/21 0452 05/03/21 0737 05/03/21 1203  BP: 118/65 (!) 112/92  114/63  Pulse: 88 97  87  Resp: 19 20  18   Temp: 98 F (36.7 C) 97.8 F (36.6 C)  98.1 F (36.7 C)  TempSrc: Oral Oral  Oral  SpO2: 95% 93% 95% 96%  Weight:  (!) 147.6 kg      Intake/Output Summary (Last 24 hours) at 05/03/2021 1434 Last data filed at  05/03/2021 1424 Gross per 24 hour  Intake 1320 ml  Output 1750 ml  Net -430 ml    Filed Weights   05/01/21 0408 05/02/21 0322 05/03/21 0452  Weight: (!) 150.4 kg (!) 148.2 kg (!) 147.6 kg    Examination:  General exam: Appears calm and comfortable, morbidly obese Respiratory system: Diminished breath sounds due to body habitus. Respiratory effort normal. Cardiovascular system: S1 & S2 heard, RRR. No JVD, murmurs, rubs, gallops or clicks.  +1 pitting edema bilateral lower extremity Gastrointestinal system: Abdomen is nondistended, soft and nontender. No organomegaly or masses felt. Normal bowel sounds heard. Central nervous system: Alert and oriented. No focal neurological deficits. Extremities: Symmetric 5 x 5 power. Skin: No rashes, lesions or ulcers.  Psychiatry: Judgement and insight appear poor  Data Reviewed: I have personally reviewed following labs and imaging studies  CBC: Recent Labs  Lab 04/29/21 1010 04/30/21 0206  WBC 7.3 7.7  HGB 14.1 14.1  HCT 46.1 46.1  MCV 97.5 96.6  PLT 253 243    Basic Metabolic Panel: Recent Labs  Lab 04/29/21 1010 04/29/21 1946 04/30/21 0206 05/01/21 0218 05/02/21 0443 05/03/21 0121  NA 138  --  136 136 136 136  K 4.1  --  4.2 3.4* 3.5 3.4*  CL 91*  --  86* 83* 83* 85*  CO2 38*  --  39* 45* 45* 40*  GLUCOSE 132*  --  144* 116* 126* 123*  BUN 27*  --  36* 34* 34* 34*  CREATININE 1.78*  --  2.21* 1.83* 1.80* 1.82*  CALCIUM 9.5  --  9.2 8.9 9.0 8.9  MG  --  2.1  --   --  2.3 2.4    GFR: Estimated Creatinine Clearance: 62.9 mL/min (A) (by C-G formula based on SCr of 1.82 mg/dL (H)). Liver Function Tests: No results for input(s): AST, ALT, ALKPHOS, BILITOT, PROT, ALBUMIN in the last 168 hours. No results for input(s): LIPASE, AMYLASE in the last 168 hours. No results for input(s): AMMONIA in the last 168 hours. Coagulation Profile: No results for input(s): INR, PROTIME in the last 168 hours. Cardiac Enzymes: No results  for input(s): CKTOTAL, CKMB, CKMBINDEX, TROPONINI in the last 168 hours. BNP (last 3 results) No results for input(s): PROBNP in the last 8760 hours. HbA1C: No results for input(s): HGBA1C in the last 72 hours. CBG: No results for input(s): GLUCAP in the last 168 hours. Lipid Profile: No results for input(s): CHOL, HDL, LDLCALC, TRIG, CHOLHDL, LDLDIRECT in the last 72 hours. Thyroid Function Tests: No results for input(s): TSH, T4TOTAL, FREET4, T3FREE, THYROIDAB in the last 72 hours.  Anemia Panel: No results for input(s): VITAMINB12, FOLATE, FERRITIN, TIBC, IRON, RETICCTPCT in the last 72 hours. Sepsis Labs: No results for input(s): PROCALCITON, LATICACIDVEN in the last 168 hours.  Recent Results (from the past 240 hour(s))  Resp Panel by RT-PCR (Flu A&B, Covid) Nasopharyngeal Swab     Status: None   Collection Time: 04/29/21  3:51 PM   Specimen: Nasopharyngeal Swab; Nasopharyngeal(NP) swabs in vial transport medium  Result Value Ref Range Status   SARS Coronavirus 2 by RT PCR NEGATIVE NEGATIVE Final    Comment: (NOTE) SARS-CoV-2 target nucleic acids are NOT DETECTED.  The SARS-CoV-2 RNA is generally detectable in upper respiratory specimens during the acute phase of infection. The lowest concentration of SARS-CoV-2 viral copies this assay can detect is 138 copies/mL. A negative result does not preclude SARS-Cov-2 infection and should not be used as the sole basis for treatment or other patient management decisions. A negative result may occur with  improper specimen collection/handling, submission of specimen other than nasopharyngeal swab, presence of viral mutation(s) within the areas targeted by this assay, and inadequate number of viral copies(<138 copies/mL). A negative result must be combined with clinical observations, patient history, and epidemiological information. The expected result is Negative.  Fact Sheet for Patients:   BloggerCourse.com  Fact Sheet for Healthcare Providers:  SeriousBroker.it  This test is no t yet approved or cleared by the [place] FDA and  has been authorized for detection and/or diagnosis of SARS-CoV-2 by FDA under an Emergency Use Authorization (EUA). This EUA will remain  in effect (meaning this test can be used) for the duration of the COVID-19 declaration under Section 564(b)(1) of the Act, 21 U.S.C.section 360bbb-3(b)(1), unless the authorization is terminated  or revoked sooner.       Influenza  A by PCR NEGATIVE NEGATIVE Final   Influenza B by PCR NEGATIVE NEGATIVE Final    Comment: (NOTE) The Xpert Xpress SARS-CoV-2/FLU/RSV plus assay is intended as an aid in the diagnosis of influenza from Nasopharyngeal swab specimens and should not be used as a sole basis for treatment. Nasal washings and aspirates are unacceptable for Xpert Xpress SARS-CoV-2/FLU/RSV testing.  Fact Sheet for Patients: EntrepreneurPulse.com.au  Fact Sheet for Healthcare Providers: IncredibleEmployment.be  This test is not yet approved or cleared by the [place] FDA and has been authorized for detection and/or diagnosis of SARS-CoV-2 by FDA under an Emergency Use Authorization (EUA). This EUA will remain in effect (meaning this test can be used) for the duration of the COVID-19 declaration under Section 564(b)(1) of the Act, 21 U.S.C. section 360bbb-3(b)(1), unless the authorization is terminated or revoked.  Performed at [facility], [place] [address] [place], [place] [address]   MRSA Next Gen by PCR, Nasal     Status: Abnormal   Collection Time: 04/29/21  8:00 PM   Specimen: Nasal Mucosa; Nasal Swab  Result Value Ref Range Status   MRSA by PCR Next Gen DETECTED (A) NOT DETECTED Final    Comment: RESULT CALLED TO, READ BACK BY AND VERIFIED WITH: D HART,RN@0658  04/30/21 [place] (NOTE) The  GeneXpert MRSA Assay (FDA approved for NASAL specimens only), is one component of a comprehensive MRSA colonization surveillance program. It is not intended to diagnose MRSA infection nor to guide or monitor treatment for MRSA infections. Test performance is not FDA approved in patients less than 17 years old. Performed at [facility], [place] [address] [place], [place] [address]        Radiology Studies: No results found.  Scheduled Meds:  acetaZOLAMIDE  500 mg Oral BID   amLODipine  5 mg Oral Daily   amoxicillin  500 mg Oral Q8H   apixaban  5 mg Oral BID   atorvastatin  10 mg Oral Daily   bisoprolol  5 mg Oral Daily   famotidine  20 mg Oral BID   fluticasone furoate-vilanterol  1 puff Inhalation Daily   furosemide  80 mg Oral BID   isosorbide dinitrate  20 mg Oral TID   mupirocin ointment  1 application Nasal BID   sodium chloride flush  3 mL Intravenous Q12H   sodium chloride flush  3 mL Intravenous Q12H   spironolactone  25 mg Oral Daily   tamsulosin  0.4 mg Oral QPC supper   Continuous Infusions:  sodium chloride     sodium chloride       LOS: 3 days   Time spent: 26 minutes   Darliss Cheney, MD [facility]  05/03/2021, 2:34 PM  Please page via Amion and do not message via secure chat for anything urgent. Secure chat can be used for anything non urgent.  How to contact the [facility] Attending or Consulting provider [place] or covering provider during after hours [place], for this patient?  Check the care team in [facility] and look for a) attending/consulting TRH provider listed and b) the [facility] team listed. Page or secure chat [phone]. Log into [email or web address] and use 's universal password to access. If you do not have the password, please contact the hospital operator. Locate the [facility] provider you are looking for under [facility] and page to a number that you can be directly reached. If you still have difficulty reaching the provider, please page the  [facility] (Director  on Call) for the Hospitalists listed on amion for assistance.

## 2021-05-03 NOTE — TOC Transition Note (Signed)
Transition of Care [facility]) - CM/SW Discharge Note   Patient Details  Name: WESS BANEY MRN: [id number] Date of Birth: [birth year]  Transition of [facility]) CM/SW Contact:  Lawerance Sabal, RN Phone Number: 05/03/2021, 12:15 PM   Clinical Narrative:   Spoke w patient, he states that he lost his home tank on the way up from the ED. Notified [employer] that patient will need tank for transport. Requested RN to make sure patient has full understanding of DC meds. We discussed noncompliance, and he explained that he isn't noncompliant but confused by recent changes in his medications. No other TOC needs identified at this time          Patient Goals and CMS Choice        Discharge Placement                       Discharge Plan and Services                                     Social Determinants of Health (SDOH) Interventions     Readmission Risk Interventions Readmission Risk Prevention Plan 03/13/2021  Transportation Screening Complete  Medication Review Oceanographer) Complete  PCP or Specialist appointment within 3-5 days of discharge Complete  HRI or Home Care Consult Complete  SW Recovery Care/Counseling Consult Complete  Palliative Care Screening Not Applicable  Skilled Nursing Facility Complete  Some recent data might be hidden

## 2021-05-03 NOTE — Progress Notes (Signed)
Mobility Specialist Progress Note    05/03/21 1158  Mobility  Activity Ambulated in hall  Level of Assistance Standby assist, set-up cues, supervision of patient - no hands on  Assistive Device Front wheel walker  Distance Ambulated (ft) 480 ft  Mobility Ambulated with assistance in hallway  Mobility Response Tolerated well  Mobility performed by Mobility specialist  Bed Position Chair  $Mobility charge 1 Mobility   Pt received in chair and agreeable. No complaints. Ambulated on 6LO2. Returned to chair with call bell in reach.   [facility] Mobility Specialist  M.S. Primary Phone: [phone] M.S. Secondary Phone: [phone]

## 2021-05-04 DIAGNOSIS — N433 Hydrocele, unspecified: Secondary | ICD-10-CM | POA: Diagnosis not present

## 2021-05-04 DIAGNOSIS — G4733 Obstructive sleep apnea (adult) (pediatric): Secondary | ICD-10-CM

## 2021-05-04 DIAGNOSIS — I5033 Acute on chronic diastolic (congestive) heart failure: Secondary | ICD-10-CM | POA: Diagnosis not present

## 2021-05-04 DIAGNOSIS — I5042 Chronic combined systolic (congestive) and diastolic (congestive) heart failure: Secondary | ICD-10-CM

## 2021-05-04 DIAGNOSIS — J9612 Chronic respiratory failure with hypercapnia: Secondary | ICD-10-CM | POA: Diagnosis not present

## 2021-05-04 DIAGNOSIS — N179 Acute kidney failure, unspecified: Secondary | ICD-10-CM | POA: Diagnosis not present

## 2021-05-04 LAB — BASIC METABOLIC PANEL
Anion gap: 10 (ref 5–15)
BUN: 35 mg/dL — ABNORMAL HIGH (ref 8–23)
CO2: 38 mmol/L — ABNORMAL HIGH (ref 22–32)
Calcium: 8.6 mg/dL — ABNORMAL LOW (ref 8.9–10.3)
Chloride: 88 mmol/L — ABNORMAL LOW (ref 98–111)
Creatinine, Ser: 1.89 mg/dL — ABNORMAL HIGH (ref 0.61–1.24)
GFR, Estimated: 39 mL/min — ABNORMAL LOW (ref >60)
Glucose, Bld: 123 mg/dL — ABNORMAL HIGH (ref 70–99)
Potassium: 3.5 mmol/L (ref 3.5–5.1)
Sodium: 136 mmol/L (ref 135–145)

## 2021-05-04 MED ORDER — AMLODIPINE BESYLATE 5 MG PO TABS: 5.0000 mg | ORAL_TABLET | Freq: Every day | ORAL | 0 refills | Status: DC

## 2021-05-04 MED ORDER — POTASSIUM CHLORIDE CRYS ER 20 MEQ PO TBCR: 20.0000 meq | EXTENDED_RELEASE_TABLET | Freq: Two times a day (BID) | ORAL | 0 refills | Status: DC

## 2021-05-04 NOTE — Evaluation (Signed)
Occupational Therapy Evaluation Patient Details Name: Zachary Hawkins MRN: [id number] DOB: 1955-11-12 Today's Date: 05/04/2021   History of Present Illness Pt is 65 yo male admitted on 04/29/21 with acute CHF.  Pt with hx of CKD stage IIIa, diastolic and systolic CHF, HTN, atrial fibrillation on eliquis, OSA on CPAP at night (doesn't wear nightly), ACE-I angioedema (10/2017), undocumented COPD, chronic respiratory failure on 4-6L [place] after recent hospital discharge in November   Clinical Impression   PTA patient reports independent with ADLs, mobility. Admitted for above and presenting with deficits in decreased activity tolerance, endurance.  He reports his family assists as needed, and is working on Restaurant manager, fast food for increased assist at home. He currently requires min to supervision for ADLs, supervision for mobility in room with min cueing for O2 line mgmt during session. Educated on energy conservation techniques, daily weights and watching salt intake, and AE for self care (issued reacher and long handled sponge).  He is on O2 at baseline, on 5L during session with VSS.  Recommend 3:1 for shower chair at home.  Based on performance today, will follow acutely to optimize independence and tolerance for ADL/IADL performance but anticipate no further needs after dc home.      Recommendations for follow up therapy are one component of a multi-disciplinary discharge planning process, led by the attending physician.  Recommendations may be updated based on patient status, additional functional criteria and insurance authorization.   Follow Up Recommendations  No OT follow up    Assistance Recommended at Discharge PRN  Functional Status Assessment     Equipment Recommendations  BSC/3in1    Recommendations for Other Services       Precautions / Restrictions Precautions Precautions: Other (comment) (watch O2) Restrictions Weight Bearing Restrictions: No      Mobility Bed Mobility Overal  bed mobility: Modified Independent Bed Mobility: Supine to Sit;Sit to Supine     Supine to sit: Modified independent (Device/Increase time) Sit to supine: Modified independent (Device/Increase time)   General bed mobility comments: OOB in recliner upon entry    Transfers Overall transfer level: Needs assistance Equipment used: None Transfers: Sit to/from Stand Sit to Stand: Supervision                  Balance Overall balance assessment: Needs assistance Sitting-balance support: No upper extremity supported Sitting balance-Leahy Scale: Good     Standing balance support: Bilateral upper extremity supported;No upper extremity supported;During functional activity Standing balance-Leahy Scale: Good Standing balance comment: RW for energy conservation                           ADL either performed or assessed with clinical judgement   ADL Overall ADL's : Needs assistance/impaired     Grooming: Wash/dry hands;Modified independent;Standing       Lower Body Bathing: Modified independent;Sitting/lateral leans;Sit to/from stand Lower Body Bathing Details (indicate cue type and reason): educated on use of long sponge, recommended bathing sitting for energy conservation     Lower Body Dressing: Modified independent;Sit to/from stand Lower Body Dressing Details (indicate cue type and reason): issued reacher for LB dressing, educated on use of sock aide (but did not issue) Toilet Transfer: Modified Independent;Ambulation;Rolling walker (2 wheels)         Tub/Shower Transfer Details (indicate cue type and reason): verbally educated on use of BSC for shower chair Functional mobility during ADLs: Supervision/safety;Rolling walker (2 wheels) General ADL Comments: supervision for O2  ling mgmt and safety in room     Vision   Vision Assessment?: No apparent visual deficits     Perception     Praxis      Pertinent Vitals/Pain Pain Assessment: No/denies pain      Hand Dominance Right   Extremity/Trunk Assessment Upper Extremity Assessment Upper Extremity Assessment: Overall WFL for tasks assessed   Lower Extremity Assessment Lower Extremity Assessment: Defer to PT evaluation   Cervical / Trunk Assessment Cervical / Trunk Assessment: Other exceptions Cervical / Trunk Exceptions: body habitus   Communication Communication Communication: No difficulties   Cognition Arousal/Alertness: Awake/alert Behavior During Therapy: WFL for tasks assessed/performed Overall Cognitive Status: Within Functional Limits for tasks assessed                                 General Comments: verbose and requires redirection at times, but anticipate this is his baseline     General Comments  Pt on 5L O2 via [place] with VSS during session; disucssed energy conservation techniques    Exercises     Shoulder Instructions      Home Living Family/patient expects to be discharged to:: Private residence Living Arrangements: Alone Available Help at Discharge: Friend(s);Available PRN/intermittently Type of Home: House Home Access: Stairs to enter [employer] of Steps: 3+1 Entrance Stairs-Rails: None Home Layout: One level     Bathroom Shower/Tub: Chief Strategy Officer: Standard     Home Equipment: Grab bars - tub/shower   Additional Comments: Pt on 4-6 L at home; Pulse oximeter,      Prior Functioning/Environment Prior Level of Function : Driving;Independent/Modified Independent             Mobility Comments: Pt ambulates without AD; does short community distances ADLs Comments: Independent with ADLs and modified IADLs        OT Problem List: Decreased strength;Decreased activity tolerance;Obesity;Cardiopulmonary status limiting activity;Decreased knowledge of precautions;Decreased knowledge of use of DME or AE      OT Treatment/Interventions: Self-care/ADL training;Therapeutic exercise;DME and/or AE  instruction;Patient/family education;Therapeutic activities;Energy conservation    OT Goals(Current goals can be found in the care plan section) Acute Rehab OT Goals Patient Stated Goal: home OT Goal Formulation: With patient Time For Goal Achievement: 05/18/21 Potential to Achieve Goals: Good  OT Frequency: Min 2X/week   Barriers to D/C:            Co-evaluation              AM-PAC OT "6 Clicks" Daily Activity     Outcome Measure Help from another person eating meals?: None Help from another person taking care of personal grooming?: None Help from another person toileting, which includes using toliet, bedpan, or urinal?: None Help from another person bathing (including washing, rinsing, drying)?: A Little Help from another person to put on and taking off regular upper body clothing?: None Help from another person to put on and taking off regular lower body clothing?: A Little 6 Click Score: 22   End of Session Equipment Utilized During Treatment: Rolling walker (2 wheels);Oxygen Nurse Communication: Mobility status  Activity Tolerance: Patient tolerated treatment well Patient left: in chair;with call bell/phone within reach;with chair alarm set  OT Visit Diagnosis: Other abnormalities of gait and mobility (R26.89);Other (comment) (decreased activity tolerance)                Time: 2993-7169 OT Time Calculation (min): 32 min Charges:  OT General Charges $OT Visit: 1 Visit OT Evaluation $OT Eval Low Complexity: 1 Low OT Treatments $Self Care/Home Management : 8-22 mins  Jolaine Artist, OT Acute Rehabilitation Services Pager [phone] Office [phone]   Delight Stare 05/04/2021, 1:18 PM

## 2021-05-04 NOTE — Progress Notes (Signed)
Nursing Discharge Note   Admit Date:ADMITDTTM@   Discharge date:TODAY@    Zachary Hawkins is to be discharged home per MD order.  AVS completed. Reviewed with patient at bedside. Highlighted copy provided for patient to take home.  Patient/caregiver able to verbalize understanding of discharge instructions. PIV removed. Patient stable upon discharge.   Patient discharged with new oxygen tank at bedside, new BSC and rolling walker both delivered to room prior to discharge and taken to vehicle with patient.     Discharge Instructions      Information on my medicine - ELIQUIS (apixaban)  Why was Eliquis prescribed for you? Eliquis was prescribed for you to reduce the risk of a blood clot forming that can cause a stroke if you have a medical condition called atrial fibrillation (a type of irregular heartbeat).  What do You need to know about Eliquis ? Take your Eliquis TWICE DAILY - one tablet in the morning and one tablet in the evening with or without food. If you have difficulty swallowing the tablet whole please discuss with your pharmacist how to take the medication safely.  Take Eliquis exactly as prescribed by your doctor and DO NOT stop taking Eliquis without talking to the doctor who prescribed the medication.  Stopping may increase your risk of developing a stroke.  Refill your prescription before you run out.  After discharge, you should have regular check-up appointments with your healthcare provider that is prescribing your Eliquis.  In the future your dose may need to be changed if your kidney function or weight changes by a significant amount or as you get older.  What do you do if you miss a dose? If you miss a dose, take it as soon as you remember on the same day and resume taking twice daily.  Do not take more than one dose of ELIQUIS at the same time to make up a missed dose.  Important Safety Information A possible side effect of Eliquis is bleeding. You should  call your healthcare provider right away if you experience any of the following: Bleeding from an injury or your nose that does not stop. Unusual colored urine (red or dark brown) or unusual colored stools (red or black). Unusual bruising for unknown reasons. A serious fall or if you hit your head (even if there is no bleeding).  Some medicines may interact with Eliquis and might increase your risk of bleeding or clotting while on Eliquis. To help avoid this, consult your healthcare provider or pharmacist prior to using any new prescription or non-prescription medications, including herbals, vitamins, non-steroidal anti-inflammatory drugs (NSAIDs) and supplements.  This website has more information on Eliquis (apixaban): [email or web address].[email or web address]      Allergies as of 05/04/2021       Reactions   Ace Inhibitors Anaphylaxis   Black Cherry Fruit Extract [name]] Anaphylaxis   Tongue   Grenadine Flavor [flavoring Agent] Anaphylaxis   Chlorhexidine Other (See Comments)   unknown        Medication List     STOP taking these medications    hydrALAZINE 25 MG tablet Commonly known as: APRESOLINE   hydrALAZINE 50 MG tablet Commonly known as: APRESOLINE   ibuprofen 600 MG tablet Commonly known as: ADVIL       TAKE these medications    Advair HFA 115-21 MCG/ACT inhaler Generic drug: fluticasone-salmeterol INHALE 2 PUFFS INTO THE LUNGS 2 (TWO) TIMES DAILY.   albuterol 108 (90 Base) MCG/ACT inhaler Commonly known as: VENTOLIN HFA Inhale  2 puffs into the lungs every 4 (four) hours as needed for wheezing or shortness of breath.   amLODipine 5 MG tablet Commonly known as: NORVASC Take 1 tablet (5 mg total) by mouth daily. What changed:  medication strength how much to take   amoxicillin 500 MG capsule Commonly known as: AMOXIL Take 500 mg by mouth every 8 (eight) hours.   apixaban 5 MG Tabs tablet Commonly known as: ELIQUIS Take 1 tablet (5 mg  total) by mouth 2 (two) times daily.   atorvastatin 10 MG tablet Commonly known as: LIPITOR Take 10 mg by mouth daily.   bisoprolol 5 MG tablet Commonly known as: ZEBETA Take 1 tablet (5 mg total) by mouth daily.   EPINEPHrine 0.3 mg/0.3 mL Soaj injection Commonly known as: EPI-PEN Inject 0.3 mg into the muscle as needed for anaphylaxis. What changed: when to take this   famotidine 20 MG tablet Commonly known as: PEPCID Take 1 tablet (20 mg total) by mouth 2 (two) times daily.   furosemide 80 MG tablet Commonly known as: LASIX Take 1 tablet (80 mg total) by mouth 2 (two) times daily.   isosorbide dinitrate 20 MG tablet Commonly known as: ISORDIL TAKE 1 TABLET (20 MG TOTAL) BY MOUTH 3 (THREE) TIMES DAILY. SCHEDULE OFFICE VISIT What changed:  how much to take how to take this when to take this   metolazone 2.5 MG tablet Commonly known as: ZAROXOLYN Take 1 tablet (2.5 mg total) by mouth 2 (two) times a week.   multivitamin with minerals Tabs tablet Take 1 tablet by mouth daily.   polyethylene glycol 17 g packet Commonly known as: MIRALAX / GLYCOLAX Take 17 g by mouth daily as needed for moderate constipation.   potassium chloride SA 20 MEQ tablet Commonly known as: KLOR-CON M Take 1 tablet (20 mEq total) by mouth 2 (two) times daily.   spironolactone 25 MG tablet Commonly known as: ALDACTONE Take 1 tablet (25 mg total) by mouth daily.   tamsulosin 0.4 MG Caps capsule Commonly known as: FLOMAX Take 1 capsule (0.4 mg total) by mouth daily after supper.               Durable Medical Equipment  (From admission, onward)           Start     Ordered   05/04/21 1228  For home use only DME Bedside commode  Once       Comments: HD  Question:  Patient needs a bedside commode to treat with the following condition  Answer:  Weakness   05/04/21 1228   05/04/21 1141  For home use only DME Walker rolling  Once       Comments: HD  Question Answer Comment   Walker: With [place]   Patient needs a walker to treat with the following condition Weakness      05/04/21 1140             Discharge Instructions/ Education: Discharge instructions given to patient/family with verbalized understanding. Discharge education completed with patient/family including: follow up instructions, medication list, discharge activities, and limitations if indicated. Patient escorted via wheelchair to lobby and discharged home via private automobile.

## 2021-05-04 NOTE — Discharge Summary (Signed)
Physician Discharge Summary  Zachary Hawkins HRC:[id number] DOB: 02-01-1956 DOA: 04/29/2021  PCP: Pcp, No  Admit date: 04/29/2021 Discharge date: 05/04/2021 30 Day Unplanned Readmission Risk Score    Flowsheet Row ED to Hosp-Admission (Current) from 04/29/2021 in [facility] 3E HF PCU  30 Day Unplanned Readmission Risk Score (%) 23.79 Filed at 05/04/2021 0801       This score is the patient's risk of an unplanned readmission within 30 days of being discharged (0 -100%). The score is based on dignosis, age, lab data, medications, orders, and past utilization.   Low:  0-14.9   Medium: 15-21.9   High: 22-29.9   Extreme: 30 and above          Admitted From: Home Disposition: Home  Recommendations for Outpatient Follow-up:  Follow up with PCP in 1-2 weeks Please obtain BMP/CBC in one week Follow-up with your cardiologist in 2 to 3 weeks Please follow up with your PCP on the following pending results: Unresulted Labs (From admission, onward)     Start     Ordered   04/29/21 1713  Urine rapid drug screen (hosp performed)  ONCE - STAT,   STAT        04/29/21 1712              Home Health: None Equipment/Devices: Walker  Discharge Condition: Stable CODE STATUS: Full code Diet recommendation: Low-sodium  Subjective: Seen and examined.  Feels much better.  No shortness of breath.  He wants to go home today.  Brief/Interim Summary: Zachary Hawkins is a 65 y.o. male with medical history significant of CKD stage IIIa, diastolic and systolic CHF, HTN, atrial fibrillation on eliquis, OSA on CPAP at night (doesn't wear nightly), ACE-I angioedema (10/2017), undocumented COPD, chronic respiratory failure on 4-6L [place] after recent hospital discharge in November of this year who presented to ED with complaints of shortness of breath and swollen testicles that started about 2-3 days ago associated with bilateral lower extremity edema and orthopnea.  He had recent admit in  03/10/21-03/16/2021 for acute on chronic respiratory failure with pulmonary edema, hx of OSA not treated and suspected COPD with acute exacerbation. He required intubation on 03/11/21 and extubated on 03/12/21. He was discharged on 6L [place] and advised to f/u with pulm which he has done, but has never completed his PFTs. He also was diuresed and weight at time of discharge was 322 pounds. Weight 5 days ago was 345 pounds.    He saw Dr. Royann Shivers 04/24/21 and metolazone was added on in the AM before his furosemide twice weekly. He has also been taking ibuprofen for his tooth that was pulled. He has not been compliant with medication and never even picked up the metolazone per cardiology note. He also doesn't wear his oxygen like he is supposed to at home and  his oxygen was in the 80s at home on the day of admission.   Upon arrival to ED, vitals: afebrile, bp: 130/72, HR: 78, RR: 18, oxygen: 99% on 3L  Pertinent labs: CO2: 38, BUN: 27, creatinine: 1.78 (1.2-1.5), BNP: 31 CXR: cardiomegaly and low lung volumes with bibasilar atelectasis. No acute process.  Patient was admitted with following medical issues with detailed hospitalization.   Chronic respiratory failure with hypoxia/acute on chronic diastolic congestive heart failure: Presented with 20+ pound weight gain with increased LE swelling and scrotal swelling. Dry weight around 322 (was 322 on hospital d/c 03/16/21. 345lbs on the day of admission). Last echo in  01/2021 showed EF to 60-65% and grade 1 diastolic dysfunction.  Repeat echo shows normal ejection fraction, no wall motion abnormality.  He was diuresed with IV Lasix which was subsequently transitioned to oral Lasix yesterday and this was managed by cardiology who signed off yesterday.  We have diuresed at least 9 L since admission.  Weight is down as well.  Cardiology cleared him for discharge.  His scrotal edema has improved and so did his bilateral lower extremity IMA although he still has +1 pitting  edema bilateral lower extremity but no crackles on the lung exam.  He wants to go home and feels like he is back at his baseline so he is being discharged home today.   Metabolic alkalosis: He was given Diamox, CO2 improved.  Cardiology recommended no further Diamox here.   AKI on CKD stage IIIa: Baseline creatinine anywhere between 1.1-1.4.  Presented with 1.78 and peaked at 2.21, but has remained stable around 1.8 since last 3 days . likely cardiorenal/poor perfusion to kidneys but now it appears that this might be his new baseline and if so, this will be CKD stage IIIb.  Noncompliant with medications.  Never picked up metolazone.  Has also been taking NSAIDs after having his tooth pulled.  Long discussion about avoiding NSAIDs and any other nephrotoxic agents and being compliant with his medications.   Hypokalemia: Low again, will replace.   Bilateral hydroceles: Likely due to volume overload. Physical exam not consistent with cellulitis, no abx at this time. isolated right varicocele on scrotal US.  Follow-up with urology.   Paroxysmal atrial fibrillation (HCC) In NSR, rate controlled, continue telemetry  CHaDSVasc score of 2, continue his eliquis for [facility] Continue to hold bisprolol in setting of acute on chronic CHF    Essential hypertension: Continue Isordil and amlodipine (dose decreased to 5 mg by ) and Zebeta.   OSA (obstructive sleep apnea): not very compliant, wearing about 2-3 hours at a time.  Nightly CPAP.   Undocumented COPD: saw pulmonology 1x and has never followed up for PFTs.  Appears to be stable.   Tooth abscess with recent extraction  Continue amoxicillin and complete your course.   HLD: Continue atorvastatin.   BPH: Continue Flomax   Estimated body mass index is 42.93 kg/m as calculated from the following:   Height as of 04/24/21: 6\' 1"  (1.854 m).   Weight as of this encounter: 147.6 kg.  Weight loss counseling provided.    Discharge Diagnoses:  Principal  Problem:   Acute exacerbation of CHF (congestive heart failure) (HCC) Active Problems:   Essential hypertension   OSA (obstructive sleep apnea)   Paroxysmal atrial fibrillation (HCC)   AKI (acute kidney injury) (HCC)   Chronic respiratory failure with hypercapnia (HCC)   Bilateral hydrocele   Dyspnea on exertion   Acute on chronic diastolic (congestive) heart failure ([place])    Discharge Instructions   Allergies as of 05/04/2021       Reactions   Ace Inhibitors Anaphylaxis   Black Cherry Fruit Extract Marcelline Mates Extract] Anaphylaxis   Tongue   Grenadine Flavor [flavoring Agent] Anaphylaxis   Chlorhexidine Other (See Comments)   unknown        Medication List     STOP taking these medications    hydrALAZINE 25 MG tablet Commonly known as: APRESOLINE   hydrALAZINE 50 MG tablet Commonly known as: APRESOLINE   ibuprofen 600 MG tablet Commonly known as: ADVIL       TAKE these medications  Advair HFA 115-21 MCG/ACT inhaler Generic drug: fluticasone-salmeterol INHALE 2 PUFFS INTO THE LUNGS 2 (TWO) TIMES DAILY.   albuterol 108 (90 Base) MCG/ACT inhaler Commonly known as: VENTOLIN HFA Inhale 2 puffs into the lungs every 4 (four) hours as needed for wheezing or shortness of breath.   amLODipine 5 MG tablet Commonly known as: NORVASC Take 1 tablet (5 mg total) by mouth daily. What changed:  medication strength how much to take   amoxicillin 500 MG capsule Commonly known as: AMOXIL Take 500 mg by mouth every 8 (eight) hours.   apixaban 5 MG Tabs tablet Commonly known as: ELIQUIS Take 1 tablet (5 mg total) by mouth 2 (two) times daily.   atorvastatin 10 MG tablet Commonly known as: LIPITOR Take 10 mg by mouth daily.   bisoprolol 5 MG tablet Commonly known as: ZEBETA Take 1 tablet (5 mg total) by mouth daily.   EPINEPHrine 0.3 mg/0.3 mL Soaj injection Commonly known as: EPI-PEN Inject 0.3 mg into the muscle as needed for anaphylaxis. What changed:  when to take this   famotidine 20 MG tablet Commonly known as: PEPCID Take 1 tablet (20 mg total) by mouth 2 (two) times daily.   furosemide 80 MG tablet Commonly known as: LASIX Take 1 tablet (80 mg total) by mouth 2 (two) times daily.   isosorbide dinitrate 20 MG tablet Commonly known as: ISORDIL TAKE 1 TABLET (20 MG TOTAL) BY MOUTH 3 (THREE) TIMES DAILY. SCHEDULE OFFICE VISIT What changed:  how much to take how to take this when to take this   metolazone 2.5 MG tablet Commonly known as: ZAROXOLYN Take 1 tablet (2.5 mg total) by mouth 2 (two) times a week.   multivitamin with minerals Tabs tablet Take 1 tablet by mouth daily.   polyethylene glycol 17 g packet Commonly known as: MIRALAX / GLYCOLAX Take 17 g by mouth daily as needed for moderate constipation.   potassium chloride SA 20 MEQ tablet Commonly known as: KLOR-CON M Take 1 tablet (20 mEq total) by mouth 2 (two) times daily.   spironolactone 25 MG tablet Commonly known as: ALDACTONE Take 1 tablet (25 mg total) by mouth daily.   tamsulosin 0.4 MG Caps capsule Commonly known as: FLOMAX Take 1 capsule (0.4 mg total) by mouth daily after supper.        Follow-up Information     Loel Dubonnet, NP Follow up on 05/15/2021.   Specialty: Cardiology Why: 8:25 am Contact information: [place] [address] [phone]         [place] Follow up in 1 week(s).   Specialty: Cardiology Why: The office will call to arrange labwork (BMET) in the next 1 week. Contact information: [address] [place] [phone]        Croitoru, Dani Gobble, MD Follow up in 1 week(s).   Specialty: Cardiology Contact information: [employer] [address] [place] [address] [phone]                Allergies  Allergen Reactions   Ace Inhibitors Anaphylaxis   Black Cherry Fruit Extract Marcelline Mates Extract] Anaphylaxis    Tongue    Grenadine Flavor [Flavoring Agent] Anaphylaxis   Chlorhexidine Other (See Comments)    unknown    Consultations: Cardiology   Procedures/Studies: CT ABDOMEN PELVIS WO CONTRAST  Result Date: 04/29/2021 CLINICAL DATA:  Lymphadenopathy, groin abdominal pain. EXAM: CT ABDOMEN AND PELVIS WITHOUT CONTRAST TECHNIQUE: Multidetector CT imaging of the abdomen  and pelvis was performed following the standard protocol without IV contrast. COMPARISON:  None. FINDINGS: Lower chest: Right basilar atelectasis. Hepatobiliary: No focal liver abnormality is seen. No gallstones, gallbladder wall thickening, or biliary dilatation. Pancreas: Unremarkable. No pancreatic ductal dilatation or surrounding inflammatory changes. Spleen: Normal in size without focal abnormality. Adrenals/Urinary Tract: Adrenal glands are unremarkable. Kidneys are normal, without renal calculi, focal lesion, or hydronephrosis. Simple left renal cysts in the upper and lower pole. Bladder is unremarkable. Stomach/Bowel: Stomach is within normal limits. Appendix appears normal. No evidence of bowel wall thickening, distention, or inflammatory changes. Vascular/Lymphatic: Aortic atherosclerosis. Mildly enlarged bilateral inguinal lymph nodes. No retroperitoneal fibrosis or lymphadenopathy. Reproductive: Prostate is mildly enlarged. Other: There is marked skin thickening of the scrotal sac with bilateral hydroceles. Fat containing umbilical hernia. Musculoskeletal: Mild degenerate disc disease of the lumbar spine. No acute osseous abnormality or suspicious osseous lesion. IMPRESSION: 1. Marked skin thickening of the scrotal sac with bilateral hydroceles concerning for cellulitis. Mildly enlarged bilateral inguinal lymph nodes. No abdominal/pelvic lymphadenopathy. This may be secondary to scrotal infectious/inflammatory process. Clinical correlation is suggested. 2.  No CT evidence of acute abdominal/pelvic process. Electronically Signed   By: Keane Police  D.O.   On: 04/29/2021 16:31   DG Chest 2 View  Result Date: 04/29/2021 CLINICAL DATA:  Shortness of breath. Known congestive heart failure. EXAM: CHEST - 2 VIEW COMPARISON:  03/11/2021 FINDINGS: Both views are degraded by patient size. Low lung volumes, especially on the frontal radiograph. Midline trachea. Mild cardiomegaly. Radiopaque densities projecting over the lower chest bilaterally may be external to the patient. Not localized on the frontal. No pleural effusion or pneumothorax. No congestive failure. Bibasilar atelectasis, greater on the right. IMPRESSION: Cardiomegaly and low lung volumes with bibasilar atelectasis. No acute superimposed process. Decreased sensitivity and specificity exam due to technique related factors, as described above. Electronically Signed   By: Abigail Miyamoto M.D.   On: 04/29/2021 10:55   ECHOCARDIOGRAM COMPLETE  Result Date: 04/30/2021    ECHOCARDIOGRAM REPORT   Patient Name:   Zachary Hawkins Date of Exam: 04/30/2021 Medical Rec #:  [id number]       Height:       73.0 in Accession #:    [id number]      Weight:       332.2 lb Date of Birth:  [birth year]       BSA:          2.669 m Patient Age:    59 years        BP:           106/84 mmHg Patient Gender: M               HR:           77 bpm. Exam Location:  Inpatient Procedure: 2D Echo Indications:    acute diastolic chf  History:        Patient has prior history of Echocardiogram examinations. CHF,                 chronic kidney disease, Arrythmias:Paroxysmal A-fib; Risk                 Factors:Sleep Apnea and Hypertension.  Sonographer:    Johny Chess RDCS Referring Phys: [id number] [place] [employer]  Sonographer Comments: Suboptimal apical window and patient is morbidly obese. Image acquisition challenging due to respiratory motion and Image acquisition challenging due to patient body habitus. IMPRESSIONS  1. Left ventricular ejection  fraction, by estimation, is 60 to 65%. The left ventricle has normal function. The  left ventricle has no regional wall motion abnormalities. Left ventricular diastolic parameters were normal.  2. Right ventricular systolic function is normal. The right ventricular size is not well visualized.  3. The mitral valve is grossly normal. No evidence of mitral valve regurgitation. No evidence of mitral stenosis.  4. The aortic valve is grossly normal. Aortic valve regurgitation is not visualized. No aortic stenosis is present.  5. Aortic dilatation noted. There is mild dilatation of the ascending aorta, measuring 39 mm.  6. The inferior vena cava is normal in size with greater than 50% respiratory variability, suggesting right atrial pressure of 3 mmHg. Comparison(s): No significant change from prior study. FINDINGS  Left Ventricle: Left ventricular ejection fraction, by estimation, is 60 to 65%. The left ventricle has normal function. The left ventricle has no regional wall motion abnormalities. Definity contrast agent was given IV to delineate the left ventricular  endocardial borders. The left ventricular internal cavity size was normal in size. There is borderline left ventricular hypertrophy. Left ventricular diastolic parameters were normal. Right Ventricle: The right ventricular size is not well visualized. Right vetricular wall thickness was not well visualized. Right ventricular systolic function is normal. Left Atrium: Left atrial size was normal in size. Right Atrium: Right atrial size was normal in size. Pericardium: There is no evidence of pericardial effusion. Mitral Valve: The mitral valve is grossly normal. No evidence of mitral valve regurgitation. No evidence of mitral valve stenosis. Tricuspid Valve: The tricuspid valve is grossly normal. Tricuspid valve regurgitation is not demonstrated. No evidence of tricuspid stenosis. Aortic Valve: The aortic valve is grossly normal. Aortic valve regurgitation is not visualized. No aortic stenosis is present. Pulmonic Valve: The pulmonic valve was  not well visualized. Pulmonic valve regurgitation is not visualized. Aorta: Aortic dilatation noted. There is mild dilatation of the ascending aorta, measuring 39 mm. Venous: The inferior vena cava is normal in size with greater than 50% respiratory variability, suggesting right atrial pressure of 3 mmHg. IAS/Shunts: The atrial septum is grossly normal.  LEFT VENTRICLE PLAX 2D LVIDd:         4.50 cm   Diastology LVIDs:         3.40 cm   LV e' medial:    6.42 cm/s LV PW:         1.30 cm   LV E/e' medial:  8.9 LVOT diam:     2.10 cm   LV e' lateral:   8.59 cm/s LVOT Area:     3.46 cm  LV E/e' lateral: 6.7  RIGHT VENTRICLE         IVC TAPSE (M-mode): 2.1 cm  IVC diam: 2.00 cm LEFT ATRIUM             Index        RIGHT ATRIUM           Index LA diam:        3.30 cm 1.24 cm/m   RA Area:     14.50 cm LA Vol (A2C):   81.0 ml 30.34 ml/m  RA Volume:   34.80 ml  13.04 ml/m LA Vol (A4C):   53.1 ml 19.89 ml/m LA Biplane Vol: 68.1 ml 25.51 ml/m   AORTA Ao Root diam: 3.10 cm Ao Asc diam:  3.90 cm MITRAL VALVE MV Area (PHT): 3.42 cm    SHUNTS MV Decel Time: 222 msec    Systemic Diam:  2.10 cm MV E velocity: 57.20 cm/s MV A velocity: 67.20 cm/s MV E/A ratio:  0.85 Buford Dresser MD Electronically signed by Buford Dresser MD Signature Date/Time: 04/30/2021/12:43:59 PM    Final    US SCROTUM W/DOPPLER  Result Date: 04/29/2021 CLINICAL DATA:  A 65 year old male presents for evaluation of scrotal pain. EXAM: SCROTAL ULTRASOUND DOPPLER ULTRASOUND OF THE TESTICLES TECHNIQUE: Complete ultrasound examination of the testicles, epididymis, and other scrotal structures was performed. Color and spectral Doppler ultrasound were also utilized to evaluate blood flow to the testicles. COMPARISON:  None FINDINGS: Right testicle Measurements: 4.5 x 3.5 x 2.9 cm. No mass or microlithiasis visualized. Left testicle Measurements: 4.2 x 3.2 x 3.2 cm. No mass or microlithiasis visualized. Right epididymis:  Normal in size and  appearance. Left epididymis:  Normal in size and appearance. Hydrocele: Bilateral large hydroceles. Small areas of echogenic debris without septation within the RIGHT hydrocele. Varicocele: RIGHT-sided varicocele up to 5 mm. No signs of LEFT varicocele. Pulsed Doppler interrogation of both testes demonstrates normal low resistance arterial and venous waveforms bilaterally. IMPRESSION: No signs of testicular torsion. Scrotal wall thickening and bilateral hydroceles, minimally complex on the RIGHT without signs of septation. Scrotal thickening and hydroceles more likely relate to volume overload and or heart failure/anasarca. Would however correlate with any signs of infection over the scrotum. Potential isolated RIGHT varicocele. Attention on upcoming CT of the abdomen and pelvis for any signs of retroperitoneal mass or similar process that would explain and isolated RIGHT varicocele. Electronically Signed   By: Zetta Bills M.D.   On: 04/29/2021 15:50     Discharge Exam: Vitals:   05/04/21 0331 05/04/21 0912  BP: 103/72   Pulse: 77   Resp: 18   Temp: 98.2 F (36.8 C)   SpO2: 95% (!) 88%   Vitals:   05/03/21 1203 05/03/21 1935 05/04/21 0331 05/04/21 0912  BP: 114/63 94/79 103/72   Pulse: 87 76 77   Resp: 18 18 18    Temp: 98.1 F (36.7 C) 97.9 F (36.6 C) 98.2 F (36.8 C)   TempSrc: Oral Oral Oral   SpO2: 96% 94% 95% (!) 88%  Weight:   (!) 147.8 kg     General: Pt is alert, awake, not in acute distress, morbidly obese Cardiovascular: RRR, S1/S2 +, no rubs, no gallops Respiratory: CTA bilaterally, no wheezing, no rhonchi Abdominal: Soft, NT, ND, bowel sounds +, mild to moderate scrotal edema Extremities: +1 pitting edema bilateral lower extremity no cyanosis    The results of significant diagnostics from this hospitalization (including imaging, microbiology, ancillary and laboratory) are listed below for reference.     Microbiology: Recent Results (from the past 240 hour(s))   Resp Panel by RT-PCR (Flu A&B, Covid) Nasopharyngeal Swab     Status: None   Collection Time: 04/29/21  3:51 PM   Specimen: Nasopharyngeal Swab; Nasopharyngeal(NP) swabs in vial transport medium  Result Value Ref Range Status   SARS Coronavirus 2 by RT PCR NEGATIVE NEGATIVE Final    Comment: (NOTE) SARS-CoV-2 target nucleic acids are NOT DETECTED.  The SARS-CoV-2 RNA is generally detectable in upper respiratory specimens during the acute phase of infection. The lowest concentration of SARS-CoV-2 viral copies this assay can detect is 138 copies/mL. A negative result does not preclude SARS-Cov-2 infection and should not be used as the sole basis for treatment or other patient management decisions. A negative result may occur with  improper specimen collection/handling, submission of specimen other than nasopharyngeal swab, presence of  viral mutation(s) within the areas targeted by this assay, and inadequate number of viral copies(<138 copies/mL). A negative result must be combined with clinical observations, patient history, and epidemiological information. The expected result is Negative.  Fact Sheet for Patients:  EntrepreneurPulse.com.au  Fact Sheet for Healthcare Providers:  IncredibleEmployment.be  This test is no t yet approved or cleared by the [place] FDA and  has been authorized for detection and/or diagnosis of SARS-CoV-2 by FDA under an Emergency Use Authorization (EUA). This EUA will remain  in effect (meaning this test can be used) for the duration of the COVID-19 declaration under Section 564(b)(1) of the Act, 21 U.S.C.section 360bbb-3(b)(1), unless the authorization is terminated  or revoked sooner.       Influenza A by PCR NEGATIVE NEGATIVE Final   Influenza B by PCR NEGATIVE NEGATIVE Final    Comment: (NOTE) The Xpert Xpress SARS-CoV-2/FLU/RSV plus assay is intended as an aid in the diagnosis of influenza from  Nasopharyngeal swab specimens and should not be used as a sole basis for treatment. Nasal washings and aspirates are unacceptable for Xpert Xpress SARS-CoV-2/FLU/RSV testing.  Fact Sheet for Patients: EntrepreneurPulse.com.au  Fact Sheet for Healthcare Providers: IncredibleEmployment.be  This test is not yet approved or cleared by the [place] FDA and has been authorized for detection and/or diagnosis of SARS-CoV-2 by FDA under an Emergency Use Authorization (EUA). This EUA will remain in effect (meaning this test can be used) for the duration of the COVID-19 declaration under Section 564(b)(1) of the Act, 21 U.S.C. section 360bbb-3(b)(1), unless the authorization is terminated or revoked.  Performed at [facility], [place] [address] [place], [place] [address]   MRSA Next Gen by PCR, Nasal     Status: Abnormal   Collection Time: 04/29/21  8:00 PM   Specimen: Nasal Mucosa; Nasal Swab  Result Value Ref Range Status   MRSA by PCR Next Gen DETECTED (A) NOT DETECTED Final    Comment: RESULT CALLED TO, READ BACK BY AND VERIFIED WITH: D HART,RN@[id number]  04/30/21 [place] (NOTE) The GeneXpert MRSA Assay (FDA approved for NASAL specimens only), is one component of a comprehensive MRSA colonization surveillance program. It is not intended to diagnose MRSA infection nor to guide or monitor treatment for MRSA infections. Test performance is not FDA approved in patients less than 9 years old. Performed at [facility], [place] [address] [place], [place] [address]      Labs: BNP (last 3 results) Recent Labs    02/12/21 0216 03/10/21 2005 04/29/21 1010  BNP 242.1* 28.6 0000000   Basic Metabolic Panel: Recent Labs  Lab 04/29/21 1946 04/30/21 0206 05/01/21 0218 05/02/21 0443 05/03/21 0121 05/04/21 0705  NA  --  136 136 136 136 136  K  --  4.2 3.4* 3.5 3.4* 3.5  CL  --  86* 83* 83* 85* 88*  CO2  --  39* 45* 45* 40* 38*  GLUCOSE  --   144* 116* 126* 123* 123*  BUN  --  36* 34* 34* 34* 35*  CREATININE  --  2.21* 1.83* 1.80* 1.82* 1.89*  CALCIUM  --  9.2 8.9 9.0 8.9 8.6*  MG 2.1  --   --  2.3 2.4  --    Liver Function Tests: No results for input(s): AST, ALT, ALKPHOS, BILITOT, PROT, ALBUMIN in the last 168 hours. No results for input(s): LIPASE, AMYLASE in the last 168 hours. No results for input(s): AMMONIA in the last 168 hours. CBC: Recent  Labs  Lab 04/29/21 1010 04/30/21 0206  WBC 7.3 7.7  HGB 14.1 14.1  HCT 46.1 46.1  MCV 97.5 96.6  PLT 253 243   Cardiac Enzymes: No results for input(s): CKTOTAL, CKMB, CKMBINDEX, TROPONINI in the last 168 hours. BNP: Invalid input(s): POCBNP CBG: No results for input(s): GLUCAP in the last 168 hours. D-Dimer No results for input(s): DDIMER in the last 72 hours. Hgb A1c No results for input(s): HGBA1C in the last 72 hours. Lipid Profile No results for input(s): CHOL, HDL, LDLCALC, TRIG, CHOLHDL, LDLDIRECT in the last 72 hours. Thyroid function studies No results for input(s): TSH, T4TOTAL, T3FREE, THYROIDAB in the last 72 hours.  Invalid input(s): FREET3 Anemia work up No results for input(s): VITAMINB12, FOLATE, FERRITIN, TIBC, IRON, RETICCTPCT in the last 72 hours. Urinalysis    Component Value Date/Time   COLORURINE STRAW (A) 04/29/2021 1008   APPEARANCEUR CLEAR 04/29/2021 1008   LABSPEC 1.009 04/29/2021 1008   PHURINE 5.0 04/29/2021 1008   GLUCOSEU NEGATIVE 04/29/2021 1008   HGBUR SMALL (A) 04/29/2021 1008   BILIRUBINUR NEGATIVE 04/29/2021 1008   KETONESUR NEGATIVE 04/29/2021 1008   PROTEINUR NEGATIVE 04/29/2021 1008   NITRITE NEGATIVE 04/29/2021 1008   LEUKOCYTESUR NEGATIVE 04/29/2021 1008   Sepsis Labs Invalid input(s): PROCALCITONIN,  WBC,  LACTICIDVEN Microbiology Recent Results (from the past 240 hour(s))  Resp Panel by RT-PCR (Flu A&B, Covid) Nasopharyngeal Swab     Status: None   Collection Time: 04/29/21  3:51 PM   Specimen: Nasopharyngeal  Swab; Nasopharyngeal(NP) swabs in vial transport medium  Result Value Ref Range Status   SARS Coronavirus 2 by RT PCR NEGATIVE NEGATIVE Final    Comment: (NOTE) SARS-CoV-2 target nucleic acids are NOT DETECTED.  The SARS-CoV-2 RNA is generally detectable in upper respiratory specimens during the acute phase of infection. The lowest concentration of SARS-CoV-2 viral copies this assay can detect is 138 copies/mL. A negative result does not preclude SARS-Cov-2 infection and should not be used as the sole basis for treatment or other patient management decisions. A negative result may occur with  improper specimen collection/handling, submission of specimen other than nasopharyngeal swab, presence of viral mutation(s) within the areas targeted by this assay, and inadequate number of viral copies(<138 copies/mL). A negative result must be combined with clinical observations, patient history, and epidemiological information. The expected result is Negative.  Fact Sheet for Patients:  EntrepreneurPulse.com.au  Fact Sheet for Healthcare Providers:  IncredibleEmployment.be  This test is no t yet approved or cleared by the [place] FDA and  has been authorized for detection and/or diagnosis of SARS-CoV-2 by FDA under an Emergency Use Authorization (EUA). This EUA will remain  in effect (meaning this test can be used) for the duration of the COVID-19 declaration under Section 564(b)(1) of the Act, 21 U.S.C.section 360bbb-3(b)(1), unless the authorization is terminated  or revoked sooner.       Influenza A by PCR NEGATIVE NEGATIVE Final   Influenza B by PCR NEGATIVE NEGATIVE Final    Comment: (NOTE) The Xpert Xpress SARS-CoV-2/FLU/RSV plus assay is intended as an aid in the diagnosis of influenza from Nasopharyngeal swab specimens and should not be used as a sole basis for treatment. Nasal washings and aspirates are unacceptable for Xpert Xpress  SARS-CoV-2/FLU/RSV testing.  Fact Sheet for Patients: EntrepreneurPulse.com.au  Fact Sheet for Healthcare Providers: IncredibleEmployment.be  This test is not yet approved or cleared by the [place] FDA and has been authorized for detection and/or diagnosis of SARS-CoV-2 by  FDA under an Emergency Use Authorization (EUA). This EUA will remain in effect (meaning this test can be used) for the duration of the COVID-19 declaration under Section 564(b)(1) of the Act, 21 U.S.C. section 360bbb-3(b)(1), unless the authorization is terminated or revoked.  Performed at [facility], [address] [place], [place] [address]   MRSA Next Gen by PCR, Nasal     Status: Abnormal   Collection Time: 04/29/21  8:00 PM   Specimen: Nasal Mucosa; Nasal Swab  Result Value Ref Range Status   MRSA by PCR Next Gen DETECTED (A) NOT DETECTED Final    Comment: RESULT CALLED TO, READ BACK BY AND VERIFIED WITH: D HART,RN@0658  04/30/21 MK (NOTE) The GeneXpert MRSA Assay (FDA approved for NASAL specimens only), is one component of a comprehensive MRSA colonization surveillance program. It is not intended to diagnose MRSA infection nor to guide or monitor treatment for MRSA infections. Test performance is not FDA approved in patients less than 8 years old. Performed at [facility], [address], [place], [place] [address]      Time coordinating discharge: Over 30 minutes  SIGNED:   Hughie Closs, MD  [facility] 05/04/2021, 10:27 AM  If 7PM-7AM, please contact night-coverage [email or web address]

## 2021-05-04 NOTE — Evaluation (Signed)
Physical Therapy Evaluation Patient Details Name: Zachary Hawkins MRN: [id number] DOB: [birth year] Today's Date: 05/04/2021  History of Present Illness  Pt is 65 yo male admitted on 04/29/21 with acute CHF.  Pt with hx of CKD stage IIIa, diastolic and systolic CHF, HTN, atrial fibrillation on eliquis, OSA on CPAP at night (doesn't wear nightly), ACE-I angioedema (10/2017), undocumented COPD, chronic respiratory failure on 4-6L [place] after recent hospital discharge in November  Clinical Impression  Pt admitted with above diagnosis.  He was able to ambulate 400' with light use of RW.  Pt ambulated on 6 L of O2 which is baseline with stable sats.  He had steady gait pattern without LOB.  Reports he is working on getting aides to assist occasionally and has friends/families that assist with IADLs at times.  Pt was repetitive with questions at times - unsure of baseline cognition.  Pt does not have further PT needs.  Would benefit from RW at d/c.  Pt also asking about reacher for home (advised on locations to purchase).         Recommendations for follow up therapy are one component of a multi-disciplinary discharge planning process, led by the attending physician.  Recommendations may be updated based on patient status, additional functional criteria and insurance authorization.  Follow Up Recommendations No PT follow up    Assistance Recommended at Discharge Set up Supervision/Assistance  Functional Status Assessment Patient has not had a recent decline in their functional status  Equipment Recommendations  Rolling walker (2 wheels)    Recommendations for Other Services       Precautions / Restrictions Precautions Precautions: None      Mobility  Bed Mobility Overal bed mobility: Modified Independent Bed Mobility: Supine to Sit;Sit to Supine     Supine to sit: Modified independent (Device/Increase time) Sit to supine: Modified independent (Device/Increase time)         Transfers Overall transfer level: Needs assistance Equipment used: None Transfers: Sit to/from Stand Sit to Stand: Supervision                Ambulation/Gait Ambulation/Gait assistance: Supervision Gait Distance (Feet): 400 Feet Assistive device: Rolling walker (2 wheels) Gait Pattern/deviations: Step-through pattern;Wide base of support       General Gait Details: Steady gait at near normal speed; light use of RW  Stairs            Wheelchair Mobility    Modified Rankin (Stroke Patients Only)       Balance Overall balance assessment: Needs assistance Sitting-balance support: No upper extremity supported Sitting balance-Leahy Scale: Good     Standing balance support: No upper extremity supported Standing balance-Leahy Scale: Good Standing balance comment: RW for energy conservation but did take some steps in his room                             Pertinent Vitals/Pain Pain Assessment: No/denies pain    Home Living Family/patient expects to be discharged to:: Private residence Living Arrangements: Alone Available Help at Discharge: Friend(s);Available PRN/intermittently Type of Home: House Home Access: Stairs to enter Entrance Stairs-Rails: None Entrance Stairs-Number of Steps: 3+1   Home Layout: One level Home Equipment: Grab bars - tub/shower Additional Comments: Pt on 4-6 L at home; Pulse oximeter,    Prior Function Prior Level of Function : Driving;Independent/Modified Independent             Mobility Comments: Pt ambulates without  AD; does short community distances ADLs Comments: Independent with ADLs and modified IADLs     Hand Dominance   Dominant Hand: Right    Extremity/Trunk Assessment   Upper Extremity Assessment Upper Extremity Assessment: Overall WFL for tasks assessed    Lower Extremity Assessment Lower Extremity Assessment: Overall WFL for tasks assessed    Cervical / Trunk Assessment Cervical /  Trunk Assessment: Other exceptions Cervical / Trunk Exceptions: body habitus  Communication   Communication: No difficulties  Cognition Arousal/Alertness: Awake/alert Behavior During Therapy: WFL for tasks assessed/performed Overall Cognitive Status: No family/caregiver present to determine baseline cognitive functioning                                 General Comments: Pt was repetitive at times with questions; needed redirecting        General Comments General comments (skin integrity, edema, etc.): Pt on 5 L O2 with sats 93%; ambulated on 6 as 5 L not an option on tank; maintained 98% on 6 L with ambulation, placed back to 5 L with satble sats at rest    Exercises     Assessment/Plan    PT Assessment Patient does not need any further PT services  PT Problem List         PT Treatment Interventions      PT Goals (Current goals can be found in the Care Plan section)  Acute Rehab PT Goals Patient Stated Goal: return home PT Goal Formulation: All assessment and education complete, DC therapy    Frequency     Barriers to discharge        Co-evaluation               AM-PAC PT "6 Clicks" Mobility  Outcome Measure Help needed turning from your back to your side while in a flat bed without using bedrails?: None Help needed moving from lying on your back to sitting on the side of a flat bed without using bedrails?: None Help needed moving to and from a bed to a chair (including a wheelchair)?: A Little Help needed standing up from a chair using your arms (e.g., wheelchair or bedside chair)?: A Little Help needed to walk in hospital room?: A Little Help needed climbing 3-5 steps with a railing? : A Little 6 Click Score: 20    End of Session Equipment Utilized During Treatment: Oxygen Activity Tolerance: Patient tolerated treatment well Patient left: with chair alarm set;in chair;with call bell/phone within reach Nurse Communication: Mobility  status PT Visit Diagnosis: Other abnormalities of gait and mobility (R26.89)    Time: 1761-6073 PT Time Calculation (min) (ACUTE ONLY): 34 min   Charges:   PT Evaluation $PT Eval Low Complexity: 1 Low PT Treatments $Gait Training: 8-22 mins        Anise Salvo, PT Acute Rehab Services Pager [phone] [facility] [phone]   Rayetta Humphrey 05/04/2021, 10:36 AM

## 2021-05-04 NOTE — TOC Initial Note (Addendum)
Transition of Care [facility]) - Initial/Assessment Note    Patient Details  Name: Zachary Hawkins MRN: [id number] Date of Birth: [birth year]  [facility] CM/SW Contact:    Leone Haven, RN Phone Number: 05/04/2021, 10:05 AM  Clinical Narrative:                 Patient is for dc today, [employer] has brought his oxygen tank which is in the room with him to go home with.  Physical therapy is working with patient.  He states he has no issues with getting his medication, he has co pay of 3.00 thru Medicaid. Patient also wants a rolling walker, he is ok with [employer] supplying this for him. He also wants a BSC, NCM made referral to [facility] with [employer] for this  as well.   Expected Discharge Plan: Home/Self Care Barriers to Discharge: No Barriers Identified   Patient Goals and CMS Choice Patient states their goals for this hospitalization and ongoing recovery are:: return home   Choice offered to / list presented to : NA  Expected Discharge Plan and Services Expected Discharge Plan: Home/Self Care   Discharge Planning Services: CM Consult Post Acute Care Choice: NA Living arrangements for the past 2 months: Single Family Home Expected Discharge Date: 05/04/21                 DME Agency: NA       HH Arranged: NA          Prior Living Arrangements/Services Living arrangements for the past 2 months: Single Family Home Lives with:: Self Patient language and need for interpreter reviewed:: Yes Do you feel safe going back to the place where you live?: Yes      Need for Family Participation in Patient Care: No (Comment) Care giver support system in place?: No (comment) Current home services:  (home oxygen with [employer]) Criminal Activity/Legal Involvement Pertinent to Current Situation/Hospitalization: No - Comment as needed  Activities of Daily Living Home Assistive Devices/Equipment: None ADL Screening (condition at time of admission) Patient's cognitive ability  adequate to safely complete daily activities?: Yes Is the patient deaf or have difficulty hearing?: No Does the patient have difficulty seeing, even when wearing glasses/contacts?: No Does the patient have difficulty concentrating, remembering, or making decisions?: No Patient able to express need for assistance with ADLs?: Yes Does the patient have difficulty dressing or bathing?: Yes Independently performs ADLs?: Yes (appropriate for developmental age) Does the patient have difficulty walking or climbing stairs?: Yes Weakness of Legs: Both Weakness of Arms/Hands: None  Permission Sought/Granted                  Emotional Assessment Appearance:: Appears stated age Attitude/Demeanor/Rapport: Engaged Affect (typically observed): Appropriate Orientation: : Oriented to Self, Oriented to Place, Oriented to  Time, Oriented to Situation Alcohol / Substance Use: Not Applicable Psych Involvement: No (comment)  Admission diagnosis:  Scrotal pain [N50.82] Scrotal swelling [N50.89] Acute exacerbation of CHF (congestive heart failure) (HCC) [I50.9] Acute on chronic diastolic (congestive) heart failure (HCC) [I50.33] Patient Active Problem List   Diagnosis Date Noted   Acute on chronic diastolic (congestive) heart failure (HCC) 04/30/2021   AKI (acute kidney injury) (HCC) 04/29/2021   Acute exacerbation of CHF (congestive heart failure) (HCC) 04/29/2021   Chronic respiratory failure with hypercapnia (HCC) 04/29/2021   Bilateral hydrocele 04/29/2021   Dyspnea on exertion 04/29/2021   Acute respiratory failure with hypoxia (HCC) 03/10/2021   Urticaria 01/15/2021  Chronic heart failure with preserved ejection fraction (HCC)    Angioedema 01/04/2021   Influenza vaccine refused 06/26/2020   COVID-19 vaccine series completed 06/26/2020   Paroxysmal atrial fibrillation (HCC) 07/02/2019   Secondary hypercoagulable state (HCC) 07/02/2019   OSA (obstructive sleep apnea) 04/03/2019   History  of angioedema due to ACE INHIBITORS 10/13/2017   Non compliance w medication regimen 07/13/2017   Essential hypertension 06/23/2017   Obesity with serious comorbidity    Cardiomyopathy (HCC) 06/06/2017   Dyspnea 06/05/2017   PCP:  Oneita Hurt No Pharmacy:   [facility] - [place], [place] - [address] [place] [address] Phone: [phone] Fax: [phone]     Social Determinants of Health (SDOH) Interventions    Readmission Risk Interventions Readmission Risk Prevention Plan 05/04/2021 03/13/2021  Transportation Screening Complete Complete  PCP or Specialist Appt within 3-5 Days Complete -  HRI or Home Care Consult Complete -  Social Work Consult for Recovery Care Planning/Counseling Complete -  Palliative Care Screening Not Applicable -  Medication Review Oceanographer) Complete Complete  PCP or Specialist appointment within 3-5 days of discharge - Complete  HRI or Home Care Consult - Complete  SW Recovery Care/Counseling Consult - Complete  Palliative Care Screening - Not Applicable  Skilled Nursing Facility - Complete  Some recent data might be hidden

## 2021-05-05 ENCOUNTER — Telehealth: Payer: Self-pay | Admitting: Cardiovascular Disease

## 2021-05-05 NOTE — Telephone Encounter (Signed)
Spoke to [employer]- she states that patient has not been taking it since prescribed, but they would like to know if you would like to switch to something else, or try PA. ED doc sent in RX originally.   Advised that MD was out of office, but would route for covering providers to review.

## 2021-05-05 NOTE — Telephone Encounter (Signed)
Crystal has been made aware that Bisoprolol was approved as of 12/8.  Prior Auth for Bisoprolol sent through CoverMyMeds: Key: [id number] and has been approved through 04/16/2022   [facility] has been made aware

## 2021-05-05 NOTE — Telephone Encounter (Signed)
Pt c/o medication issue:  1. Name of Medication: bisoprolol (ZEBETA) 5 MG tablet  2. How are you currently taking this medication (dosage and times per day)? Patient has not started taking   3. Are you having a reaction (difficulty breathing--STAT)?   4. What is your medication issue? Patient can not afford medication and needs a Prior Auth

## 2021-05-06 ENCOUNTER — Telehealth: Payer: Self-pay | Admitting: Cardiovascular Disease

## 2021-05-06 NOTE — Progress Notes (Signed)
Synopsis: Referred for decreased O2 saturation, restrictive lung disease, dyspnea by No ref. provider found  Subjective:   PATIENT ID: Vinscent L Pilat GENDER: male DOB: [birth year], MRN: [id number]  Chief Complaint  Patient presents with   Follow-up    Breathing has improved some since the last visit. He states he has minimal wheezing at times. His cough has been non prod. He c/o nasal congestion and watery eyes. He is using his albuterol inhaler 2 x daily on average.    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 He was referred to allergy/immunology.   He has some DOE. Taking advair one puff BID. No cough especially since stopping smoking.   He never had any itching during either of the above episodes. Tryptase was negative.   He is finishing course of steroids.   Snores. Some PND. Some daytime sleepiness.   He had an episode yesterday where home oxygen cut off and it worried him.   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   Interval HPI: Last seen by me 01/2021 at which point recommended PFTs (to hold advair for 3 days beforehand), home sleep study.   Since then has had admission 11/1-11/7/22 for acute on chronic hypoxic hypercapnic respiratory failure due to volume overload, diuresed to 322 lb, possible AECOPD. Had admission 04/29/21-05/04/21 for volume overload, diuresed from 345 lb to 322 lb.   Today he is 330 lb. Breathing is good actually he says. He thinks he's using lasix 80 BID, unsure about metolazone. Using advair 2 puffs BID, rinsing  mouth afterward  He says he got a CPAP through [facility]. He does not know the brand. He uses it 2-3 times per week. Usually wears it for a couple hours. He is afraid if he wears it, he'll go to sleep and die.   Otherwise pertinent review of systems is negative.     Past Medical History:  Diagnosis Date   Cardiomyopathy (HCC) 06/2017   EF 40-45%   Chronic renal insufficiency, stage 3 (moderate) (HCC)    Diastolic dysfunction 06/2017   grade 2    Hypertension    Hypertensive urgency    Obesity      Family History  Problem Relation Age of Onset   Diabetes Mother    Diabetes Father      No past surgical history on file.  Social History   Socioeconomic History   Marital status: Single    Spouse name: Not on file   Number of children: Not on file   Years of education: Not on file   Highest education level: Not on file  Occupational History   Not on file  Tobacco Use   Smoking status: Former    Packs/day: 1.00    Years: 36.00    Pack years: 36.00    Types: Cigarettes    Quit date: 01/08/2021    Years since quitting: 0.3   Smokeless tobacco: Never  Substance and Sexual Activity   Alcohol use: Yes    Alcohol/week: 1.0 standard drink    Types: 1 Shots of liquor  per week    Comment: socially   Drug use: Yes    Types: Marijuana    Comment: every other week   Sexual activity: Not on file  Other Topics Concern   Not on file  Social History Narrative   Not on file   Social Determinants of Health   Financial Resource Strain: Not on file  Food Insecurity: Not on file  Transportation Needs: Not on file  Physical Activity: Not on file  Stress: Not on file  Social Connections: Not on file  Intimate Partner Violence: Not on file     Allergies  Allergen Reactions   Ace Inhibitors Anaphylaxis   Black Cherry Fruit Extract Valentino Saxon[Cherry Extract] Anaphylaxis    Tongue   Grenadine Flavor [Flavoring Agent] Anaphylaxis   Chlorhexidine Other (See Comments)    unknown      Outpatient Medications Prior to Visit  Medication Sig Dispense Refill   ADVAIR HFA 115-21 MCG/ACT inhaler INHALE 2 PUFFS INTO THE LUNGS 2 (TWO) TIMES DAILY. 36 g 12   albuterol (VENTOLIN HFA) 108 (90 Base) MCG/ACT inhaler Inhale 2 puffs into the lungs every 4 (four) hours as needed for wheezing or shortness of breath. 18 g 0   amLODipine (NORVASC) 5 MG tablet Take 1 tablet (5 mg total) by mouth daily. 30 tablet 0   amoxicillin (AMOXIL) 500 MG capsule Take 500 mg by mouth every 8 (eight) hours.     atorvastatin (LIPITOR) 10 MG tablet Take 10 mg by mouth daily.     bisoprolol (ZEBETA) 5 MG tablet Take 1 tablet (5 mg total) by mouth daily. 90 tablet 3   EPINEPHrine 0.3 mg/0.3 mL IJ SOAJ injection Inject 0.3 mg into the muscle as needed for anaphylaxis. (Patient taking differently: Inject 0.3 mg into the muscle once as needed for anaphylaxis.) 1 each 1   famotidine (PEPCID) 20 MG tablet Take 1 tablet (20 mg total) by mouth 2 (two) times daily. 28 tablet 0   furosemide (LASIX) 80 MG tablet Take 1 tablet (80 mg total) by mouth 2 (two) times daily. 180 tablet 3   isosorbide dinitrate (ISORDIL) 20 MG tablet TAKE 1 TABLET (20 MG TOTAL) BY MOUTH 3 (THREE) TIMES DAILY. SCHEDULE OFFICE VISIT (Patient taking differently: Take 20 mg by mouth 3 (three) times daily.) 270 tablet 1   metolazone (ZAROXOLYN) 2.5 MG tablet Take 1 tablet (2.5 mg total) by mouth 2 (two) times a week. 10 tablet 3   Multiple Vitamin (MULTIVITAMIN WITH MINERALS) TABS tablet Take 1 tablet by mouth daily.     polyethylene glycol (MIRALAX / GLYCOLAX) 17 g packet Take 17 g by mouth daily as needed for moderate constipation. 14 each 0   potassium chloride SA (KLOR-CON M) 20 MEQ tablet Take 1 tablet (20 mEq total) by mouth 2 (two) times daily. 60 tablet 0   spironolactone (ALDACTONE) 25 MG tablet Take 1 tablet (25 mg total) by mouth daily. 90 tablet 3   tamsulosin (FLOMAX) 0.4 MG CAPS capsule Take 1 capsule (0.4 mg total) by mouth daily after  supper. 30 capsule 2   apixaban (ELIQUIS) 5 MG TABS tablet Take 1 tablet (5 mg total) by mouth 2 (two) times daily. 60 tablet 2   No facility-administered medications prior to visit.       Objective:   Physical Exam:  General appearance: 65 y.o., male, NAD, conversant  Eyes: anicteric sclerae; PERRL, tracking appropriately HENT: NCAT; MMM Neck: Trachea midline; no lymphadenopathy, no JVD Lungs: distant, no crackles, no  wheeze, with normal respiratory effort on 2L O2 CV: RRR, no murmur  Abdomen: Soft, non-tender; non-distended, BS present  Extremities: 1+ nonpitting peripheral edema, warm Skin: Normal turgor and texture; no rash Psych: Appropriate affect Neuro: Alert and oriented to person and place, no focal deficit     Vitals:   05/07/21 1135  BP: 134/86  Pulse: 82  Temp: 97.8 F (36.6 C)  TempSrc: Oral  SpO2: 94%  Weight: (!) 331 lb 3.2 oz (150.2 kg)  Height: 6\' 1"  (1.854 m)    94% on RA BMI Readings from Last 3 Encounters:  05/07/21 43.70 kg/m  05/04/21 42.98 kg/m  04/24/21 45.78 kg/m   Wt Readings from Last 3 Encounters:  05/07/21 (!) 331 lb 3.2 oz (150.2 kg)  05/04/21 (!) 325 lb 12.8 oz (147.8 kg)  04/24/21 (!) 347 lb (157.4 kg)     CBC    Component Value Date/Time   WBC 7.7 04/30/2021 0206   RBC 4.77 04/30/2021 0206   HGB 14.1 04/30/2021 0206   HGB 16.7 07/11/2020 1103   HCT 46.1 04/30/2021 0206   HCT 50.3 07/11/2020 1103   PLT 243 04/30/2021 0206   PLT 232 07/11/2020 1103   MCV 96.6 04/30/2021 0206   MCV 88 07/11/2020 1103   MCH 29.6 04/30/2021 0206   MCHC 30.6 04/30/2021 0206   RDW 14.6 04/30/2021 0206   RDW 13.6 07/11/2020 1103   LYMPHSABS 2.3 03/10/2021 2005   MONOABS 1.2 (H) 03/10/2021 2005   EOSABS 0.4 03/10/2021 2005   BASOSABS 0.1 03/10/2021 2005   C4 31 IgE 394 C1 inh above normal  Tryptase WNL  Chest Imaging: CXR 01/13/21 reviewed by me and unremarkable  CT A/P 04/29/21 lung bases reviewed by with RLL scar and lower  lobe ?cysts   Pulmonary Functions Testing Results: No flowsheet data found.    Echocardiogram:   TTE 01/08/21 EF 60-65%, G1DD  TTE 04/30/21 essentially normal     Assessment & Plan:   # Dyspnea on exertion: # OSA # HFpEF # Chronic hypoxic hypercapnic respiratory failure May be multifactorial from deconditioning, diastolic dysfunction (but looks well compensated today), undertreated sleep-disordered breathing, possible asthma/copd.   # Angioedema: possible that it is still related to ACEi even years from taking it. Not sure what to make of the elevated C1inh level.  # Smoking: Congratulated him on cessation. Does not qualify for lung cancer screening with <20 py smoking.  Plan: - Focused this visit on encouraging adherence to lasix 80 BID, metolazone 2.5 mg twice weekly, diligent use of CPAP at night and with naps - We will call to verify CPAP manufacturer to see if we can get download - check BMP next visit - DME supplier is [employer] - continue advair 2 puffs BID, encouraged to stop it 3 days prior to PFT next visit - weight loss encouraged - follow up with allergy for angioedema      Maryjane Hurter, MD [facility] 05/07/2021 11:55 AM

## 2021-05-06 NOTE — Telephone Encounter (Signed)
Called patient to schedule lab work, MB is full  Called both his sister and son (on [place]) son's VM is full and the sister's phone number is not working.

## 2021-05-07 ENCOUNTER — Other Ambulatory Visit: Payer: Self-pay

## 2021-05-07 ENCOUNTER — Encounter: Payer: Self-pay | Admitting: Gastroenterology

## 2021-05-07 ENCOUNTER — Ambulatory Visit (INDEPENDENT_AMBULATORY_CARE_PROVIDER_SITE_OTHER): Payer: Medicaid Other | Admitting: Student

## 2021-05-07 ENCOUNTER — Ambulatory Visit: Payer: Medicaid Other

## 2021-05-07 ENCOUNTER — Encounter: Payer: Self-pay | Admitting: Student

## 2021-05-07 ENCOUNTER — Telehealth (HOSPITAL_BASED_OUTPATIENT_CLINIC_OR_DEPARTMENT_OTHER): Payer: Self-pay | Admitting: Cardiovascular Disease

## 2021-05-07 ENCOUNTER — Telehealth: Payer: Self-pay | Admitting: Student

## 2021-05-07 VITALS — BP 134/86 | HR 82 | Temp 97.8°F | Ht 73.0 in | Wt 331.2 lb

## 2021-05-07 DIAGNOSIS — R0609 Other forms of dyspnea: Secondary | ICD-10-CM

## 2021-05-07 DIAGNOSIS — Z9989 Dependence on other enabling machines and devices: Secondary | ICD-10-CM

## 2021-05-07 DIAGNOSIS — J9611 Chronic respiratory failure with hypoxia: Secondary | ICD-10-CM | POA: Diagnosis not present

## 2021-05-07 DIAGNOSIS — G4733 Obstructive sleep apnea (adult) (pediatric): Secondary | ICD-10-CM | POA: Diagnosis not present

## 2021-05-07 NOTE — Telephone Encounter (Signed)
Spoke with patient regarding lab work ordered by Dr. Hinda Kehr to go to the [facility] office tomorrow 05/08/21 between the hours of 8am and 4pm for lab work---patient states he will be there after 2:00p m tomorrow----patient also has appointments today 05/07/21 at [facility]---arrival time is 10:45 am---[address], [address]----he voiced his understanding.

## 2021-05-07 NOTE — Patient Instructions (Addendum)
-   lasix 80 mg twice daily - metolazone 2.5 mg twice weekly - advair 2 puffs twice daily - stop this for 3 days before breathing tests - we'll clarify what your oxygen prescription - tell us what kind (brand) of CPAP you're using. CPAP every time you sleep ideally for at least 4 hours each time you use it - come back in a month

## 2021-05-07 NOTE — Telephone Encounter (Signed)
Pt seen in clinic 05/07/21 and mentioned that he uses o2 and CPAP machine. He was unsure about settings or who prescribed these for him. I called [facility] [phone] and spoke with rep. She states that the pt is using BIPAP 10/20 and o2 2lpm. She says that these were prescribed by "Amenia". I asked for her to fax a DL from the BIPAP, and she said the RT who has access to the DL is not back until Tuesday 05/12/20 and will have her fax one over when she returns. Forwarding to Dr Thora Lance as Lorain Childes.

## 2021-05-11 ENCOUNTER — Emergency Department (HOSPITAL_COMMUNITY)
Admission: EM | Admit: 2021-05-11 | Discharge: 2021-05-11 | Disposition: A | Discharge status: Home / Self Care | Payer: Medicaid Other | Attending: Emergency Medicine | Admitting: Emergency Medicine

## 2021-05-11 ENCOUNTER — Emergency Department (HOSPITAL_COMMUNITY): Payer: Medicaid Other

## 2021-05-11 ENCOUNTER — Encounter (HOSPITAL_COMMUNITY): Payer: Self-pay | Admitting: Emergency Medicine

## 2021-05-11 DIAGNOSIS — Z7901 Long term (current) use of anticoagulants: Secondary | ICD-10-CM | POA: Insufficient documentation

## 2021-05-11 DIAGNOSIS — J441 Chronic obstructive pulmonary disease with (acute) exacerbation: Secondary | ICD-10-CM | POA: Diagnosis not present

## 2021-05-11 DIAGNOSIS — I110 Hypertensive heart disease with heart failure: Secondary | ICD-10-CM | POA: Diagnosis not present

## 2021-05-11 DIAGNOSIS — Z79899 Other long term (current) drug therapy: Secondary | ICD-10-CM | POA: Diagnosis not present

## 2021-05-11 DIAGNOSIS — R0602 Shortness of breath: Secondary | ICD-10-CM | POA: Diagnosis present

## 2021-05-11 DIAGNOSIS — I480 Paroxysmal atrial fibrillation: Secondary | ICD-10-CM | POA: Diagnosis not present

## 2021-05-11 DIAGNOSIS — M7989 Other specified soft tissue disorders: Secondary | ICD-10-CM | POA: Insufficient documentation

## 2021-05-11 DIAGNOSIS — Z7951 Long term (current) use of inhaled steroids: Secondary | ICD-10-CM | POA: Insufficient documentation

## 2021-05-11 DIAGNOSIS — Z20822 Contact with and (suspected) exposure to covid-19: Secondary | ICD-10-CM | POA: Insufficient documentation

## 2021-05-11 DIAGNOSIS — I5033 Acute on chronic diastolic (congestive) heart failure: Secondary | ICD-10-CM | POA: Insufficient documentation

## 2021-05-11 LAB — COMPREHENSIVE METABOLIC PANEL
ALT: 16 U/L (ref 0–44)
AST: 23 U/L (ref 15–41)
Albumin: 3.3 g/dL — ABNORMAL LOW (ref 3.5–5.0)
Alkaline Phosphatase: 52 U/L (ref 38–126)
Anion gap: 11 (ref 5–15)
BUN: 40 mg/dL — ABNORMAL HIGH (ref 8–23)
CO2: 33 mmol/L — ABNORMAL HIGH (ref 22–32)
Calcium: 9 mg/dL (ref 8.9–10.3)
Chloride: 94 mmol/L — ABNORMAL LOW (ref 98–111)
Creatinine, Ser: 1.67 mg/dL — ABNORMAL HIGH (ref 0.61–1.24)
GFR, Estimated: 46 mL/min — ABNORMAL LOW (ref >60)
Glucose, Bld: 128 mg/dL — ABNORMAL HIGH (ref 70–99)
Potassium: 4 mmol/L (ref 3.5–5.1)
Sodium: 138 mmol/L (ref 135–145)
Total Bilirubin: 0.7 mg/dL (ref 0.3–1.2)
Total Protein: 6.1 g/dL — ABNORMAL LOW (ref 6.5–8.1)

## 2021-05-11 LAB — CBC WITH DIFFERENTIAL/PLATELET
Abs Immature Granulocytes: 0.07 10*3/uL (ref 0.00–0.07)
Basophils Absolute: 0.1 10*3/uL (ref 0.0–0.1)
Basophils Relative: 1 %
Eosinophils Absolute: 0.7 10*3/uL — ABNORMAL HIGH (ref 0.0–0.5)
Eosinophils Relative: 10 %
HCT: 44.6 % (ref 39.0–52.0)
Hemoglobin: 13.6 g/dL (ref 13.0–17.0)
Immature Granulocytes: 1 %
Lymphocytes Relative: 25 %
Lymphs Abs: 1.8 10*3/uL (ref 0.7–4.0)
MCH: 29.1 pg (ref 26.0–34.0)
MCHC: 30.5 g/dL (ref 30.0–36.0)
MCV: 95.5 fL (ref 80.0–100.0)
Monocytes Absolute: 0.7 10*3/uL (ref 0.1–1.0)
Monocytes Relative: 10 %
Neutro Abs: 3.8 10*3/uL (ref 1.7–7.7)
Neutrophils Relative %: 53 %
Platelets: 235 10*3/uL (ref 150–400)
RBC: 4.67 MIL/uL (ref 4.22–5.81)
RDW: 14.4 % (ref 11.5–15.5)
WBC: 7 10*3/uL (ref 4.0–10.5)
nRBC: 0 % (ref 0.0–0.2)

## 2021-05-11 LAB — RESP PANEL BY RT-PCR (FLU A&B, COVID) ARPGX2
Influenza A by PCR: NEGATIVE
Influenza B by PCR: NEGATIVE
SARS Coronavirus 2 by RT PCR: NEGATIVE

## 2021-05-11 LAB — TROPONIN I (HIGH SENSITIVITY)
Troponin I (High Sensitivity): 17 ng/L (ref <18)
Troponin I (High Sensitivity): 16 ng/L (ref <18)

## 2021-05-11 LAB — D-DIMER, QUANTITATIVE: D-Dimer, Quant: 0.78 ug/mL-FEU — ABNORMAL HIGH (ref 0.00–0.50)

## 2021-05-11 LAB — BRAIN NATRIURETIC PEPTIDE: B Natriuretic Peptide: 26.1 pg/mL (ref 0.0–100.0)

## 2021-05-11 MED ORDER — METHYLPREDNISOLONE SODIUM SUCC 125 MG IJ SOLR
60.0000 mg | Freq: Once | INTRAMUSCULAR | Status: AC
  Administered 2021-05-11: 60 mg via INTRAVENOUS
  Filled 2021-05-11: qty 2

## 2021-05-11 MED ORDER — IOHEXOL 350 MG/ML SOLN
80.0000 mL | Freq: Once | INTRAVENOUS | Status: AC | PRN
  Administered 2021-05-11: 80 mL via INTRAVENOUS

## 2021-05-11 MED ORDER — PREDNISONE 10 MG (21) PO TBPK: ORAL_TABLET | Freq: Every day | ORAL | 0 refills | Status: DC

## 2021-05-11 MED ORDER — SODIUM CHLORIDE 0.9 % IV BOLUS
500.0000 mL | Freq: Once | INTRAVENOUS | Status: AC
  Administered 2021-05-11: 500 mL via INTRAVENOUS

## 2021-05-11 MED ORDER — DOXYCYCLINE HYCLATE 100 MG PO CAPS: 100.0000 mg | ORAL_CAPSULE | Freq: Two times a day (BID) | ORAL | 0 refills | Status: DC

## 2021-05-11 MED ORDER — IPRATROPIUM-ALBUTEROL 0.5-2.5 (3) MG/3ML IN SOLN
3.0000 mL | Freq: Once | RESPIRATORY_TRACT | Status: AC
  Administered 2021-05-11: 3 mL via RESPIRATORY_TRACT
  Filled 2021-05-11: qty 3

## 2021-05-11 NOTE — ED Triage Notes (Signed)
Pt arrived via [facility] from home. Per EMS pt c/o SOB onset approx 1hr prior to calling for EMS. Pt denies CP, N/V, dizziness or any additional complaints at present. Pt has hx of COPD and is on 3L O2 at home. SpO2 WNL on 3L O2 via [place].

## 2021-05-11 NOTE — ED Provider Notes (Signed)
[facility] EMERGENCY DEPARTMENT Provider Note   CSN: [id number] Arrival date & time: 05/11/21  1606     History  Chief Complaint  Patient presents with   Shortness of Breath    Zachary Hawkins is a 66 y.o. male.  This is a 66 y.o. male with significant medical history as below, including CHF on 2-3 L [place], COPD no longer smoking, AF on eliquis, OSA but not on CPAP 2/2 cost issueswho presents to the ED with complaint of dyspnea.  Patient reports worsened exertional dyspnea with past 24 hours, orthopnea worse over the past month.  He checked his pulse ox at home and it was 82 to 83%.  He increases oxygen to 5 L which did improve his respiratory status.  He called EMS.  Patient denies chest pain, fevers, chills, nausea or vomiting.  Reports compliance with her medications, no recent medication changes that he is aware of.  No sick contacts or recent travel.  He feels as though his breathing is greatly improved on the increased oxygen level.  Mild increased lower extremity swelling over the past 2 days.  No change in bowel or bladder function.  No rashes.  No abdominal pain.  No lightheadedness.  Symptoms worsened by exertion, improved by rest.  The history is provided by the patient and the EMS personnel. No language interpreter was used.     Patient Active Problem List   Diagnosis Date Noted   Acute on chronic diastolic (congestive) heart failure ([place]) 04/30/2021   AKI (acute kidney injury) ([place]) 04/29/2021   Acute exacerbation of CHF (congestive heart failure) ([place]) 04/29/2021   Chronic respiratory failure with hypercapnia ([place]) 04/29/2021   Bilateral hydrocele 04/29/2021   Dyspnea on exertion 04/29/2021   Acute respiratory failure with hypoxia ([place]) 03/10/2021   Urticaria 01/15/2021   Chronic heart failure with preserved ejection fraction ([place])    Angioedema 01/04/2021   Influenza vaccine refused 06/26/2020   COVID-19 vaccine series completed 06/26/2020    Paroxysmal atrial fibrillation ([place]) 07/02/2019   Secondary hypercoagulable state ([place]) 07/02/2019   OSA (obstructive sleep apnea) 04/03/2019   History of angioedema due to ACE INHIBITORS 10/13/2017   Non compliance w medication regimen 07/13/2017   Essential hypertension 06/23/2017   Obesity with serious comorbidity    Cardiomyopathy ([place]) 06/06/2017   Dyspnea 06/05/2017    Home Medications Prior to Admission medications   Medication Sig Start Date End Date Taking? Authorizing Provider  doxycycline (VIBRAMYCIN) 100 MG capsule Take 1 capsule (100 mg total) by mouth 2 (two) times daily. 05/11/21  Yes Wynona Dove A, DO  predniSONE (STERAPRED UNI-PAK 21 TAB) 10 MG (21) TBPK tablet Take by mouth daily. Take 6 tabs by mouth daily  for 2 days, then 5 tabs for 2 days, then 4 tabs for 2 days, then 3 tabs for 2 days, 2 tabs for 2 days, then 1 tab by mouth daily for 2 days 05/11/21  Yes Wynona Dove A, DO  ADVAIR HFA 115-21 MCG/ACT inhaler INHALE 2 PUFFS INTO THE LUNGS 2 (TWO) TIMES DAILY. 07/23/20   Charlott Rakes, MD  albuterol (VENTOLIN HFA) 108 (90 Base) MCG/ACT inhaler Inhale 2 puffs into the lungs every 4 (four) hours as needed for wheezing or shortness of breath. 02/13/21   Croitoru, Mihai, MD  amLODipine (NORVASC) 5 MG tablet Take 1 tablet (5 mg total) by mouth daily. 05/04/21 06/03/21  Darliss Cheney, MD  amoxicillin (AMOXIL) 500 MG capsule Take 500 mg by mouth every 8 (  eight) hours. 04/27/21   [provider]  apixaban (ELIQUIS) 5 MG TABS tablet Take 1 tablet (5 mg total) by mouth 2 (two) times daily. 01/09/21 04/29/21  Bonnielee Haff, MD  atorvastatin (LIPITOR) 10 MG tablet Take 10 mg by mouth daily.    [provider]  bisoprolol (ZEBETA) 5 MG tablet Take 1 tablet (5 mg total) by mouth daily. 03/16/21   Pokhrel, Corrie Mckusick, MD  EPINEPHrine 0.3 mg/0.3 mL IJ SOAJ injection Inject 0.3 mg into the muscle as needed for anaphylaxis. Patient taking differently: Inject 0.3 mg into the muscle  once as needed for anaphylaxis. 01/09/21   Bonnielee Haff, MD  famotidine (PEPCID) 20 MG tablet Take 1 tablet (20 mg total) by mouth 2 (two) times daily. 01/17/21 01/17/22  Julian Hy, DO  furosemide (LASIX) 80 MG tablet Take 1 tablet (80 mg total) by mouth 2 (two) times daily. 04/07/21   Croitoru, Mihai, MD  isosorbide dinitrate (ISORDIL) 20 MG tablet TAKE 1 TABLET (20 MG TOTAL) BY MOUTH 3 (THREE) TIMES DAILY. SCHEDULE OFFICE VISIT Patient taking differently: Take 20 mg by mouth 3 (three) times daily. 10/17/20 10/17/21  Margarita Mail, PA-C  metolazone (ZAROXOLYN) 2.5 MG tablet Take 1 tablet (2.5 mg total) by mouth 2 (two) times a week. 04/27/21 07/26/21  Croitoru, Mihai, MD  Multiple Vitamin (MULTIVITAMIN WITH MINERALS) TABS tablet Take 1 tablet by mouth daily. 01/10/21   Bonnielee Haff, MD  polyethylene glycol (MIRALAX / GLYCOLAX) 17 g packet Take 17 g by mouth daily as needed for moderate constipation. 01/09/21   Bonnielee Haff, MD  potassium chloride SA (KLOR-CON M) 20 MEQ tablet Take 1 tablet (20 mEq total) by mouth 2 (two) times daily. 05/04/21 06/03/21  Darliss Cheney, MD  spironolactone (ALDACTONE) 25 MG tablet Take 1 tablet (25 mg total) by mouth daily. 04/07/21   Croitoru, Mihai, MD  tamsulosin (FLOMAX) 0.4 MG CAPS capsule Take 1 capsule (0.4 mg total) by mouth daily after supper. 03/16/21   Pokhrel, Corrie Mckusick, MD      Allergies    Ace inhibitors, Black cherry fruit extract Marcelline Mates extract], Grenadine flavor [flavoring agent], and Chlorhexidine    Review of Systems   Review of Systems  Constitutional:  Positive for fatigue. Negative for chills and fever.  HENT:  Negative for facial swelling and trouble swallowing.   Eyes:  Negative for photophobia and visual disturbance.  Respiratory:  Positive for cough and shortness of breath.   Cardiovascular:  Positive for leg swelling. Negative for chest pain and palpitations.  Gastrointestinal:  Negative for abdominal pain, nausea and vomiting.   Endocrine: Negative for polydipsia and polyuria.  Genitourinary:  Negative for difficulty urinating and hematuria.  Musculoskeletal:  Negative for gait problem and joint swelling.  Skin:  Negative for pallor and rash.  Neurological:  Negative for syncope and headaches.  Psychiatric/Behavioral:  Negative for agitation and confusion.    Physical Exam Updated Vital Signs BP (!) 131/93    Pulse 82    Temp 98.9 F (37.2 C) (Oral)    Resp 16    SpO2 (!) 88%  Physical Exam Vitals and nursing note reviewed.  Constitutional:      General: He is not in acute distress.    Appearance: Normal appearance. He is well-developed. He is obese. He is not ill-appearing.  HENT:     Head: Normocephalic and atraumatic.     Right Ear: External ear normal.     Left Ear: External ear normal.     Mouth/Throat:  Mouth: Mucous membranes are moist.  Eyes:     General: No scleral icterus. Cardiovascular:     Rate and Rhythm: Normal rate and regular rhythm.     Pulses: Normal pulses.     Heart sounds: Normal heart sounds.  Pulmonary:     Effort: Pulmonary effort is normal. Tachypnea present. No respiratory distress.     Breath sounds: Normal breath sounds. Decreased air movement present.  Abdominal:     General: Abdomen is flat.     Palpations: Abdomen is soft.     Tenderness: There is no abdominal tenderness.  Musculoskeletal:        General: Normal range of motion.     Cervical back: Normal range of motion.     Right lower leg: No edema.     Left lower leg: No edema.  Skin:    General: Skin is warm and dry.     Capillary Refill: Capillary refill takes less than 2 seconds.  Neurological:     Mental Status: He is alert and oriented to person, place, and time.  Psychiatric:        Mood and Affect: Mood normal.        Behavior: Behavior normal.    ED Results / Procedures / Treatments   Labs (all labs ordered are listed, but only abnormal results are displayed) Labs Reviewed  CBC WITH  DIFFERENTIAL/PLATELET - Abnormal; Notable for the following components:      Result Value   Eosinophils Absolute 0.7 (*)    All other components within normal limits  COMPREHENSIVE METABOLIC PANEL - Abnormal; Notable for the following components:   Chloride 94 (*)    CO2 33 (*)    Glucose, Bld 128 (*)    BUN 40 (*)    Creatinine, Ser 1.67 (*)    Total Protein 6.1 (*)    Albumin 3.3 (*)    GFR, Estimated 46 (*)    All other components within normal limits  D-DIMER, QUANTITATIVE - Abnormal; Notable for the following components:   D-Dimer, Quant 0.78 (*)    All other components within normal limits  RESP PANEL BY RT-PCR (FLU A&B, COVID) [id number]  BRAIN NATRIURETIC PEPTIDE  BLOOD GAS, VENOUS  TROPONIN I (HIGH SENSITIVITY)  TROPONIN I (HIGH SENSITIVITY)    EKG EKG Interpretation  Date/Time:  Monday May 11 2021 16:21:50 EST Ventricular Rate:  70 PR Interval:  165 QRS Duration: 88 QT Interval:  381 QTC Calculation: 412 R Axis:   38 Text Interpretation: Sinus rhythm Borderline ST elevation, anterolateral leads Baseline wander in lead(s) I II aVR Interpretation limited secondary to artifact similar to prior Confirmed by Wynona Dove ([id number]) on 05/11/2021 4:29:11 PM  Radiology CT Angio Chest PE W and/or Wo Contrast  Result Date: 05/11/2021 CLINICAL DATA:  Short of breath, positive D-dimer, COPD EXAM: CT ANGIOGRAPHY CHEST WITH CONTRAST TECHNIQUE: Multidetector CT imaging of the chest was performed using the standard protocol during bolus administration of intravenous contrast. Multiplanar CT image reconstructions and MIPs were obtained to evaluate the vascular anatomy. CONTRAST:  22mL OMNIPAQUE IOHEXOL 350 MG/ML SOLN COMPARISON:  05/11/2021, 04/29/2021 FINDINGS: Cardiovascular: This is a technically suboptimal evaluation of the pulmonary vasculature due to timing of the contrast bolus. The majority of contrast is within the left side of the heart by the time of the exam. There is sufficient  contrast to exclude central and proximal segmental pulmonary emboli however. Heart is unremarkable without pericardial effusion. No evidence of thoracic aortic aneurysm or dissection.  Mild atherosclerosis of the aorta and coronary vessels. Mediastinum/Nodes: No enlarged mediastinal, hilar, or axillary lymph nodes. Thyroid gland, trachea, and esophagus demonstrate no significant findings. Lungs/Pleura: Areas of linear consolidation are seen at the lung bases, right greater than left, consistent with atelectasis. Mild background emphysema. No airspace disease, effusion, or pneumothorax. Central airways are patent. Upper Abdomen: No acute abnormality. Musculoskeletal: No acute or destructive bony lesions. Reconstructed images demonstrate no additional findings. Review of the MIP images confirms the above findings. IMPRESSION: 1. No evidence of central or proximal segmental pulmonary embolus. Timing of contrast bolus limits evaluation of the distal segmental and subsegmental vessels. 2. Minimal bibasilar atelectasis.  No acute airspace disease. 3. Aortic Atherosclerosis (ICD10-I70.0) and Emphysema (ICD10-J43.9). Electronically Signed   By: Randa Ngo M.D.   On: 05/11/2021 20:38   DG Chest Port 1 View  Result Date: 05/11/2021 CLINICAL DATA:  Shortness of breath EXAM: PORTABLE CHEST 1 VIEW COMPARISON:  04/29/2021 FINDINGS: Transverse diameter of heart is increased. Thoracic aorta is tortuous and ectatic. There are linear densities in the right lower lung fields with interval improvement. There are no new infiltrates or signs of alveolar pulmonary edema. IMPRESSION: Cardiomegaly. There are no signs of pulmonary edema. There is interval decrease in the linear densities in the right lower lung fields suggesting resolving atelectasis/pneumonia. Electronically Signed   By: Elmer Picker M.D.   On: 05/11/2021 16:36    Procedures Procedures   Medications Ordered in ED Medications  sodium chloride 0.9 % bolus  500 mL (0 mLs Intravenous Stopped 05/11/21 2200)  ipratropium-albuterol (DUONEB) 0.5-2.5 (3) MG/3ML nebulizer solution 3 mL (3 mLs Nebulization Given 05/11/21 1902)  methylPREDNISolone sodium succinate (SOLU-MEDROL) 125 mg/2 mL injection 60 mg (60 mg Intravenous Given 05/11/21 1905)  iohexol (OMNIPAQUE) 350 MG/ML injection 80 mL (80 mLs Intravenous Contrast Given 05/11/21 2030)    ED Course/ Medical Decision Making/ A&P                           Medical Decision Making   CC: dib  This patient complains of above; this involves an extensive number of treatment options and is a complaint that carries with it a high risk of complications and morbidity. Vital signs were reviewed. Serious etiologies considered.  Record review:   Previous records obtained and reviewed   Work up as above, notable for:  Labs & imaging results that were available during my care of the patient were reviewed by me and considered in my medical decision making.   Low risk Wells score, D-dimer is elevated.  Will obtain CT PE.  I ordered imaging studies which included chest x-ray, CT PE.  And I independently visualized and interpreted imaging which showed no acute airspace disease.  No evidence of acute pulmonary edema.  Management: Patient given nebulized breathing treatments, steroids.  Patient reports significant provement to his symptoms following nebulized breathing treatments and steroids.  He is able to converse without respiratory difficulty.  He is on his home oxygen 3 L currently.  Overall reports he is feeling better.  Pulse ox ranging between 88 to 95% w/o conversational dyspnea, likely his baseline given extensive COPD and CHF history.  He has no conversational dyspnea at this time, no notable exertional dyspnea, pt reports that he feels at his approximate baseline.   Cardiac monitoring reviewed by myself demonstrates normal sinus rhythm.   The patient improved significantly and was discharged in stable  condition. Detailed discussions were had  with the patient regarding current findings, and need for close f/u with PCP or on call doctor. The patient has been instructed to return immediately if the symptoms worsen in any way for re-evaluation. Patient verbalized understanding and is in agreement with current care plan. All questions answered prior to discharge.      This chart was dictated using voice recognition software.  Despite best efforts to proofread,  errors can occur which can change the documentation meaning.  Final Clinical Impression(s) / ED Diagnoses Final diagnoses:  COPD exacerbation ([place])    Rx / DC Orders ED Discharge Orders          Ordered    doxycycline (VIBRAMYCIN) 100 MG capsule  2 times daily        05/11/21 2144    predniSONE (STERAPRED UNI-PAK 21 TAB) 10 MG (21) TBPK tablet  Daily        05/11/21 2144              Jeanell Sparrow, DO 05/11/21 2317

## 2021-05-11 NOTE — Discharge Instructions (Addendum)
Please follow-up with your primary care doctor in the next 2 to 3 days.  Please return to the emergency part for any worsening or worrisome symptoms.

## 2021-05-14 ENCOUNTER — Encounter (HOSPITAL_BASED_OUTPATIENT_CLINIC_OR_DEPARTMENT_OTHER): Payer: Self-pay

## 2021-05-14 NOTE — Progress Notes (Signed)
Office Visit    Patient Name: Zachary Hawkins Date of Encounter: 05/15/2021  PCP:  Aviva Kluver   [facility]  Cardiologist:  Thurmon Fair, MD  Advanced Practice Provider:  No care team member to display Electrophysiologist:  None      Chief Complaint    Zachary Hawkins is a 66 y.o. male with a hx of CKDIIIa, combined systolic and diastolic heart failure, HTN, atrial fibrillation, OSA, ACE angioedema 10/2017, COPD, chronic respiratory failure on home O2 presents today for hospital follow up   Past Medical History    Past Medical History:  Diagnosis Date   Cardiomyopathy (HCC) 06/2017   EF 40-45%   Chronic renal insufficiency, stage 3 (moderate) (HCC)    Diastolic dysfunction 06/2017   grade 2    Hypertension    Hypertensive urgency    Obesity    History reviewed. No pertinent surgical history.  Allergies  Allergies  Allergen Reactions   Ace Inhibitors Anaphylaxis   Black Cherry Fruit Extract [name]] Anaphylaxis    Tongue   Grenadine Flavor [Flavoring Agent] Anaphylaxis   Chlorhexidine Other (See Comments)    unknown    History of Present Illness    Zachary Hawkins is a 66 y.o. male with a hx of CKDIIIa, combined systolic and diastolic heart failure, HTN, atrial fibrillation, OSA, ACE angioedema 10/2017, COPD, chronic respiratory failure on home O2 last seen while hospitalized.  Echo 9/2022LVEF 60-65%, gr1DD.  He was admitted 03/10/21-03/16/21 for acute on chronic respiratory failure with pulmonary edema, hx of OSA not treated and suspected COPD with acute exacerbation. He required intubation on 03/11/21 and extubated on 03/12/21. He was discharged on 6L O2 and followed up with pulmonology. Weight at discharge 322 lbs.   He saw Dr. Royann Hawkins 04/24/21 and Metolazone added in the morning prior to Furosemide twice weekly. He unfortunately did not pick up the medication. He presented to the hospital 04/29/21 with acute on chronic heart failure  as well as acute on chronic respiratory failure. He had 20+ lb weight gain (weighing 245 lbs), LE edema, scrotal swelling. He was diuresed 9L. He was 324.7 lbs at discharge.  He presents today for follow up.  He is up 12.3 pounds since discharge 2 weeks ago. Tells me he feels he is worn out with minimal activity. Notes dyspnea on exertion and is wearing his oxygen "for the most part" at home.. Tells me there is something wrong with his Trilogy device and has not been using. He has some problems with his [employer] and oxygen. He has contacted them to resolve the issue. Has enough oxygen but not the tank size he prefers. Weighs himself at home very intermittently. Taking Lasix twice per day using pill pack but unclear when or if he is taking Metolazone. He is not taking it 30 minutes prior to Lasix.   EKGs/Labs/Other Studies Reviewed:   The following studies were reviewed today:  ECHO 04/30/21:  1. Left ventricular ejection fraction, by estimation, is 60 to 65%. The  left ventricle has normal function. The left ventricle has no regional  wall motion abnormalities. Left ventricular diastolic parameters were  normal.   2. Right ventricular systolic function is normal. The right ventricular  size is not well visualized.   3. The mitral valve is grossly normal. No evidence of mitral valve  regurgitation. No evidence of mitral stenosis.   4. The aortic valve is grossly normal. Aortic valve regurgitation is not  visualized. No aortic stenosis is present.   5. Aortic dilatation noted. There is mild dilatation of the ascending  aorta, measuring 39 mm.   6. The inferior vena cava is normal in size with greater than 50%  respiratory variability, suggesting right atrial pressure of 3 mmHg.   EKG:  No EKG today.  Recent Labs: 04/29/2021: TSH 0.962 05/03/2021: Magnesium 2.4 05/11/2021: ALT 16; B Natriuretic Peptide 26.1; BUN 40; Creatinine, Ser 1.67; Hemoglobin 13.6; Platelets 235; Potassium 4.0; Sodium  138  Recent Lipid Panel    Component Value Date/Time   CHOL 189 07/11/2020 1103   TRIG 83 03/12/2021 0510   HDL 49 07/11/2020 1103   CHOLHDL 3.9 07/11/2020 1103   LDLCALC 109 (H) 07/11/2020 1103    Risk Assessment/Calculations:   CHA2DS2-VASc Score = 2   This indicates a 2.2% annual risk of stroke. The patient's score is based upon: CHF History: 1 HTN History: 1 Diabetes History: 0 Stroke History: 0 Vascular Disease History: 0 Age Score: 0 Gender Score: 0   Home Medications   Current Meds  Medication Sig   ADVAIR HFA 115-21 MCG/ACT inhaler INHALE 2 PUFFS INTO THE LUNGS 2 (TWO) TIMES DAILY.   albuterol (VENTOLIN HFA) 108 (90 Base) MCG/ACT inhaler Inhale 2 puffs into the lungs every 4 (four) hours as needed for wheezing or shortness of breath.   amLODipine (NORVASC) 5 MG tablet Take 1 tablet (5 mg total) by mouth daily.   atorvastatin (LIPITOR) 10 MG tablet Take 10 mg by mouth daily.   bisoprolol (ZEBETA) 5 MG tablet Take 1 tablet (5 mg total) by mouth daily.   doxycycline (VIBRAMYCIN) 100 MG capsule Take 1 capsule (100 mg total) by mouth 2 (two) times daily.   EPINEPHrine 0.3 mg/0.3 mL IJ SOAJ injection Inject 0.3 mg into the muscle as needed for anaphylaxis. (Patient taking differently: Inject 0.3 mg into the muscle once as needed for anaphylaxis.)   famotidine (PEPCID) 20 MG tablet Take 1 tablet (20 mg total) by mouth 2 (two) times daily.   furosemide (LASIX) 80 MG tablet Take 1 tablet (80 mg total) by mouth 2 (two) times daily.   isosorbide dinitrate (ISORDIL) 20 MG tablet TAKE 1 TABLET (20 MG TOTAL) BY MOUTH 3 (THREE) TIMES DAILY. SCHEDULE OFFICE VISIT (Patient taking differently: Take 20 mg by mouth 3 (three) times daily.)   metolazone (ZAROXOLYN) 2.5 MG tablet Take 1 tablet (2.5 mg total) by mouth 2 (two) times a week.   Multiple Vitamin (MULTIVITAMIN WITH MINERALS) TABS tablet Take 1 tablet by mouth daily.   polyethylene glycol (MIRALAX / GLYCOLAX) 17 g packet Take 17 g  by mouth daily as needed for moderate constipation.   potassium chloride SA (KLOR-CON M) 20 MEQ tablet Take 1 tablet (20 mEq total) by mouth 2 (two) times daily.   predniSONE (STERAPRED UNI-PAK 21 TAB) 10 MG (21) TBPK tablet Take by mouth daily. Take 6 tabs by mouth daily  for 2 days, then 5 tabs for 2 days, then 4 tabs for 2 days, then 3 tabs for 2 days, 2 tabs for 2 days, then 1 tab by mouth daily for 2 days   spironolactone (ALDACTONE) 25 MG tablet Take 1 tablet (25 mg total) by mouth daily.   tamsulosin (FLOMAX) 0.4 MG CAPS capsule Take 1 capsule (0.4 mg total) by mouth daily after supper.   [DISCONTINUED] amoxicillin (AMOXIL) 500 MG capsule Take 500 mg by mouth every 8 (eight) hours.     Review of Systems  Review of Systems  Constitutional: Positive for malaise/fatigue. Negative for chills and fever.  Cardiovascular:  Positive for dyspnea on exertion and leg swelling. Negative for chest pain, irregular heartbeat, near-syncope, orthopnea, palpitations and syncope.  Respiratory:  Positive for shortness of breath. Negative for cough and wheezing.   Gastrointestinal:  Negative for melena, nausea and vomiting.  Genitourinary:  Negative for hematuria.  Neurological:  Negative for dizziness, light-headedness and weakness.    All other systems reviewed and are otherwise negative except as noted above.  Physical Exam    VS:  BP (!) 146/84    Pulse 95    Ht 6\' 1"  (1.854 m)    Wt (!) 337 lb 12.8 oz (153.2 kg)    SpO2 97%    BMI 44.57 kg/m  , BMI Body mass index is 44.57 kg/m.  Wt Readings from Last 3 Encounters:  05/15/21 (!) 337 lb 12.8 oz (153.2 kg)  05/07/21 (!) 331 lb 3.2 oz (150.2 kg)  05/04/21 (!) 325 lb 12.8 oz (147.8 kg)     GEN: Well nourished, overweigh, well developed, in no acute distress. HEENT: normal. Neck: Supple, no JVD, carotid bruits, or masses. Cardiac: RRR, no murmurs, rubs, or gallops. No clubbing, cyanosis. Bilateral LE with 2+  pitting edema. .  Radials/PT 2+  and equal bilaterally.  Respiratory:  Respirations regular and unlabored, diminished bases bilaterally likely due to body habitus GI: Soft, nontender, nondistended. MS: No deformity or atrophy. Skin: Warm and dry, no rash. Neuro:  Strength and sensation are intact. Psych: Normal affect.  Assessment & Plan    Combined systolic and diastolic heart failure - [id number] LVEF 40-45% improved to 60-65% 2022.  Recent admission with acute on chronic diastolic heart failure.  He is presently up 12 pounds from discharge 2 weeks prior. Dry weight approximately 322 lbs. Anticipate noncompliance with heart failure lifestyle changes as well as recent addition of prednisone are contributory. He is not taking Metolazone consistently nor 30 minutes prior to Furosemide. He will take Metolazone tomorrow (Saturday) 30 minutes prior to Furosemide. Thereafter he will take on Mondays and Thursdays. Updated instructions provided and he will take his pill pack to his pharmacy to be updated. Additional GDMT includes Isordil, Bisoprolol, Spironolactone. Previous angioedema with ACE. Low salt diet, fluid restriction <2 liters encouraged. Will refer to [facility] RN for heart failure teaching and medication management.   Deconditioning - Will refer to [facility] PT as he is markedly deconditioned after recent admission.   Chronic hypoxic respiratory failure / COPD -seen in ED for exacerbation 05/11/2021.  Presently on prednisone as well as doxycycline.  Upcoming follow-up with pulmonology 06/23/2021  CKDIIIa - Careful titration of diuretic and antihypertensive.    Hypokalemia - continue potassium 20 mEq twice daily.  Refill provided.  Bilateral hydroceles - Follow with urology.  PAF / Chronic anticoagulation - SR today by auscultation. Continue Bisoprolol 5mg  daily.  Continue Eliquis 5 mg twice daily.  Does not meet dose reduction criteria.  Denies bleeding complications. CHA2DS2-VASc Score = 2 [CHF History: 1, HTN History: 1, Diabetes History: 0,  Stroke History: 0, Vascular Disease History: 0, Age Score: 0, Gender Score: 0].  Therefore, the patient's annual risk of stroke is 2.2 %.    Eilquis refill provided.  HTN - BP elevated in setting of volume overload. Increased diuresis, as above. Continue current antihypertensive regimen. Recommend home monitoring of BP. BP goal <130/80.   OSA - Reports Trilogy device at home not working. Encouraged him to call his  DME company to ensure resolution. Discussed the importance of using his Trilogy ventilator at night.   COPD - ED visit 05/11/20 with exacerbation. Encouraged to complete course of Doxycycline and Prednisone. Continue to follow with pulmonology.   HLD - Continue Atorvastatin 10mg  QD.     Cardiac Rehabilitation Eligibility Assessment  The patient is ready to start cardiac rehabilitation from a cardiac standpoint.  However he has additionally been referred for home health and may complete that prior to cardiac rehab if necessary from an insurance standpoint.   Disposition: Follow up in 2 week(s) with Sanda Klein, MD or APP.  Signed, Loel Dubonnet, NP 05/15/2021, 11:14 AM [place]

## 2021-05-15 ENCOUNTER — Encounter (HOSPITAL_BASED_OUTPATIENT_CLINIC_OR_DEPARTMENT_OTHER): Payer: Self-pay | Admitting: Family

## 2021-05-15 ENCOUNTER — Ambulatory Visit (HOSPITAL_BASED_OUTPATIENT_CLINIC_OR_DEPARTMENT_OTHER): Payer: Medicaid Other | Admitting: Family

## 2021-05-15 ENCOUNTER — Other Ambulatory Visit: Payer: Self-pay

## 2021-05-15 VITALS — BP 146/84 | HR 95 | Ht 73.0 in | Wt 337.8 lb

## 2021-05-15 DIAGNOSIS — J9611 Chronic respiratory failure with hypoxia: Secondary | ICD-10-CM

## 2021-05-15 DIAGNOSIS — J449 Chronic obstructive pulmonary disease, unspecified: Secondary | ICD-10-CM | POA: Diagnosis not present

## 2021-05-15 DIAGNOSIS — I5033 Acute on chronic diastolic (congestive) heart failure: Secondary | ICD-10-CM

## 2021-05-15 DIAGNOSIS — D6859 Other primary thrombophilia: Secondary | ICD-10-CM | POA: Diagnosis not present

## 2021-05-15 DIAGNOSIS — J9612 Chronic respiratory failure with hypercapnia: Secondary | ICD-10-CM

## 2021-05-15 DIAGNOSIS — I480 Paroxysmal atrial fibrillation: Secondary | ICD-10-CM | POA: Diagnosis not present

## 2021-05-15 DIAGNOSIS — E782 Mixed hyperlipidemia: Secondary | ICD-10-CM

## 2021-05-15 DIAGNOSIS — E876 Hypokalemia: Secondary | ICD-10-CM

## 2021-05-15 DIAGNOSIS — N1831 Chronic kidney disease, stage 3a: Secondary | ICD-10-CM

## 2021-05-15 DIAGNOSIS — R29898 Other symptoms and signs involving the musculoskeletal system: Secondary | ICD-10-CM

## 2021-05-15 DIAGNOSIS — G4733 Obstructive sleep apnea (adult) (pediatric): Secondary | ICD-10-CM

## 2021-05-15 MED ORDER — APIXABAN 5 MG PO TABS: 5.0000 mg | ORAL_TABLET | Freq: Two times a day (BID) | ORAL | 5 refills | Status: DC

## 2021-05-15 MED ORDER — POTASSIUM CHLORIDE CRYS ER 20 MEQ PO TBCR: 20.0000 meq | EXTENDED_RELEASE_TABLET | Freq: Two times a day (BID) | ORAL | 1 refills | Status: DC

## 2021-05-15 NOTE — Patient Instructions (Addendum)
Medication Instructions:  Your physician has recommended you make the following change in your medication:   TODAY Take your Furosemide (Lasix) 80mg  this afternoon  TOMORROW (Saturday) Take Metolazone 30-minutes before you take your morning Furosemide (Lasix) Take Furosemide (Lasix) in the afternoon  Sunday, January 8th Take Furosemide (Lasix) in the morning and the afternoon  Monday, January 9th Take Metolazone 30-minutes before you take your morning Furosemide (Lasix) Take Furosemide (Lasix) in the afternoon ______________________________________________________________________  From then on:  Mondays and Thursdays Take Metolazone 30-minutes before you take your morning Furosemide (Lasix) Take Furosemide (Lasix) in the afternoon  Sunday, Tuesday, Wednesday, Friday, and Saturday Take Furosemide (Lasix) in the morning and the afternoon  *If you need a refill on your cardiac medications before your next appointment, please call your pharmacy*  Lab Work: None ordered today.   Testing/Procedures: None ordered today.    Follow-Up: At [facility], you and your health needs are our priority.  As part of our continuing mission to provide you with exceptional heart care, we have created designated Provider Care Teams.  These Care Teams include your primary Cardiologist (physician) and Advanced Practice Providers (APPs -  Physician Assistants and Nurse Practitioners) who all work together to provide you with the care you need, when you need it.  We recommend signing up for the patient portal called "MyChart".  Sign up information is provided on this After Visit Summary.  MyChart is used to connect with patients for Virtual Visits (Telemedicine).  Patients are able to view lab/test results, encounter notes, upcoming appointments, etc.  Non-urgent messages can be sent to your provider as well.   To learn more about what you can do with MyChart, go to [facility].    Your  next appointment:   January 18th at 1:30 PM with Dr. January 20 at the [facility]   Other Instructions We will reach out to our Social Work team to see if we can get you set up with a meal delivery service.

## 2021-05-22 ENCOUNTER — Telehealth: Payer: Self-pay | Admitting: Cardiovascular Disease

## 2021-05-22 NOTE — Telephone Encounter (Signed)
°  Pt c/o medication issue:  1. Name of Medication: furosemide (LASIX) 80 MG tablet  2. How are you currently taking this medication (dosage and times per day)? Take 1 tablet (80 mg total) by mouth 2 (two) times daily  3. Are you having a reaction (difficulty breathing--STAT)? no  4. What is your medication issue? They are calling to see if they can change Fursemide to Trosemide once daily

## 2021-05-22 NOTE — Telephone Encounter (Signed)
Left message for Dorene Grebe or Tresa Endo to call back. The patient has a follow up appointment with Dr. Royann Shivers on 1/18

## 2021-05-27 ENCOUNTER — Encounter: Payer: Self-pay | Admitting: Cardiovascular Disease

## 2021-05-27 ENCOUNTER — Ambulatory Visit: Payer: Medicaid Other | Admitting: Cardiovascular Disease

## 2021-05-27 ENCOUNTER — Other Ambulatory Visit: Payer: Self-pay

## 2021-05-27 VITALS — BP 95/58 | HR 116 | Wt 344.0 lb

## 2021-05-27 DIAGNOSIS — I5033 Acute on chronic diastolic (congestive) heart failure: Secondary | ICD-10-CM | POA: Diagnosis not present

## 2021-05-27 DIAGNOSIS — J9621 Acute and chronic respiratory failure with hypoxia: Secondary | ICD-10-CM | POA: Diagnosis not present

## 2021-05-27 DIAGNOSIS — G4733 Obstructive sleep apnea (adult) (pediatric): Secondary | ICD-10-CM

## 2021-05-27 DIAGNOSIS — D6869 Other thrombophilia: Secondary | ICD-10-CM

## 2021-05-27 DIAGNOSIS — I480 Paroxysmal atrial fibrillation: Secondary | ICD-10-CM | POA: Diagnosis not present

## 2021-05-27 DIAGNOSIS — J9622 Acute and chronic respiratory failure with hypercapnia: Secondary | ICD-10-CM

## 2021-05-27 MED ORDER — TORSEMIDE 100 MG PO TABS: 100.0000 mg | ORAL_TABLET | Freq: Every day | ORAL | 11 refills | Status: DC

## 2021-05-27 NOTE — Patient Instructions (Signed)
Medication Instructions:  STOP the Furosemide STOP the Isosorbide  START Torsemide 100 mg once daily  *If you need a refill on your cardiac medications before your next appointment, please call your pharmacy*   Lab Work: Your provider would like for you to return in 2 weeks to have the following labs drawn: BMET and BNP. You do not need an appointment for the lab. Once in our office lobby there is a podium where you can sign in and ring the doorbell to alert [place] that you are here. The lab is open from 8:00 am to 4:30 pm; closed for lunch from 12:45pm-1:45pm.  If you have labs (blood work) drawn today and your tests are completely normal, you will receive your results only by: MyChart Message (if you have MyChart) OR A paper copy in the mail If you have any lab test that is abnormal or we need to change your treatment, we will call you to review the results.   Testing/Procedures: None ordered   Follow-Up: At [facility], you and your health needs are our priority.  As part of our continuing mission to provide you with exceptional heart care, we have created designated Provider Care Teams.  These Care Teams include your primary Cardiologist (physician) and Advanced Practice Providers (APPs -  Physician Assistants and Nurse Practitioners) who all work together to provide you with the care you need, when you need it.  We recommend signing up for the patient portal called "MyChart".  Sign up information is provided on this After Visit Summary.  MyChart is used to connect with patients for Virtual Visits (Telemedicine).  Patients are able to view lab/test results, encounter notes, upcoming appointments, etc.  Non-urgent messages can be sent to your provider as well.   To learn more about what you can do with MyChart, go to [email or web address].    Your next appointment:   1 month(s)  The format for your next appointment:   In Person  Provider:   Dr. Royann Shivers or APP {   Other  Instructions Please be sure to wear your CPAP all night.

## 2021-05-27 NOTE — Progress Notes (Signed)
Cardiology Office Note:    Date:  05/28/2021   ID:  BIRNEY SALUD, DOB 1955-07-27, MRN [id number]  PCP:  Pcp, No   [facility] Providers Cardiologist:  Sanda Klein, MD     Referring MD: No ref. provider found   Chief Complaint  Patient presents with   Congestive Heart Failure   History of Present Illness:    Zachary Hawkins is a 66 y.o. male with a hx of respiratory failure on home oxygen, morbid obesity, obstructive sleep apnea (untreated), dilated cardiomyopathy (EF 40-45% echo 2019, improved to 60-65% in September 2022), history of smoking, chronic kidney disease stage III, hypertension and paroxysmal atrial fibrillation.  Care has been difficulty to compliance and financial issues, as well as incomplete understanding of the purpose of his medications.  He has a history of recurrent angioedema that has occurred both with and without ACE inhibitors.  He was most recently hospitalized November 1 - November 7, including a few days when he was endotracheally intubated for acute hypoxic and hypercapnic respiratory failure.  Cardiology was not consulted at that time.  He had a brief emergency room visit on January 2 for "COPD exacerbation", when his oxygen saturation was down to about 82% and he had to increase his home O2 to 5 L/minute temporarily.  A CT angiogram was performed to exclude pulmonary embolism was a negative study.  There was no clear evidence of heart failure related lung infiltrates either.  There was evidence of aortic atherosclerosis and emphysema.  Although he has remained markedly hypervolemic, he has not gained much additional weight.  His weight is currently about 22 pounds higher than it was at the time of hospital discharge in November.  He has profound abdominal distention and tense lower extremity edema.  He is wearing oxygen.  He appears more alert than on previous visits.  This may be due to the fact that he has been using CPAP now for the last few nights.  He  has only been wearing it for 3 to 4 hours at night.  I am not sure understand why, but he takes it off in the second half of the night.  Continues to eat foods that are high in sodium and appeared surprised when he is told at fast food, hot dogs, cold cuts have high sodium content.  Even though we have had this discussion before.  He appears to be more receptive today.  He is also accompanied by a friend who is living with him [name]), who is clearly more aware of nutritional food content.  He has type 1 diabetes mellitus and is familiar with reading nutritional labels.  Roy plans to stay with Zachary for a while and help him with his meals.  He has had 3 separate episodes of throat swelling this year.  Although the initial episode in June was felt to be related to treatment with lisinopril, two further events occurred without this medication on August 28 and September 2.  He was recently admitted with an episode of upper airway obstruction due to angioedema, requiring epinephrine.  He was treated with steroids and antihistamines.  Labs noted for normal C4 suggesting against C1 esterase deficiency. Tryptase negative, C1 Esterase Inhibitor slightly elevated at 52.  He was discharged on January 17, 2021.  Cardiology was not consulted during that admission and it does not appear that he required diuretics.  An echocardiogram performed during that hospitalization showed normalization of left ventricular systolic function although there was evidence of  grade 1 diastolic dysfunction.  Both atria were described as normal in size.  Past Medical History:  Diagnosis Date   Cardiomyopathy ([place]) 06/2017   EF 40-45%   Chronic renal insufficiency, stage 3 (moderate) (HCC)    Diastolic dysfunction [id number]   grade 2    Hypertension    Hypertensive urgency    Obesity     History reviewed. No pertinent surgical history.  Current Medications: Current Meds  Medication Sig   ADVAIR HFA 115-21 MCG/ACT inhaler  INHALE 2 PUFFS INTO THE LUNGS 2 (TWO) TIMES DAILY.   albuterol (VENTOLIN HFA) 108 (90 Base) MCG/ACT inhaler Inhale 2 puffs into the lungs every 4 (four) hours as needed for wheezing or shortness of breath.   amLODipine (NORVASC) 5 MG tablet Take 1 tablet (5 mg total) by mouth daily.   apixaban (ELIQUIS) 5 MG TABS tablet Take 1 tablet (5 mg total) by mouth 2 (two) times daily.   atorvastatin (LIPITOR) 10 MG tablet Take 10 mg by mouth daily.   bisoprolol (ZEBETA) 5 MG tablet Take 1 tablet (5 mg total) by mouth daily.   EPINEPHrine 0.3 mg/0.3 mL IJ SOAJ injection Inject 0.3 mg into the muscle as needed for anaphylaxis. (Patient taking differently: Inject 0.3 mg into the muscle once as needed for anaphylaxis.)   famotidine (PEPCID) 20 MG tablet Take 1 tablet (20 mg total) by mouth 2 (two) times daily.   metolazone (ZAROXOLYN) 2.5 MG tablet Take 1 tablet (2.5 mg total) by mouth 2 (two) times a week.   Multiple Vitamin (MULTIVITAMIN WITH MINERALS) TABS tablet Take 1 tablet by mouth daily.   potassium chloride SA (KLOR-CON M) 20 MEQ tablet Take 1 tablet (20 mEq total) by mouth 2 (two) times daily.   spironolactone (ALDACTONE) 25 MG tablet Take 1 tablet (25 mg total) by mouth daily.   tamsulosin (FLOMAX) 0.4 MG CAPS capsule Take 1 capsule (0.4 mg total) by mouth daily after supper.   torsemide (DEMADEX) 100 MG tablet Take 1 tablet (100 mg total) by mouth daily.   [DISCONTINUED] doxycycline (VIBRAMYCIN) 100 MG capsule Take 1 capsule (100 mg total) by mouth 2 (two) times daily.   [DISCONTINUED] furosemide (LASIX) 80 MG tablet Take 1 tablet (80 mg total) by mouth 2 (two) times daily.   [DISCONTINUED] isosorbide dinitrate (ISORDIL) 20 MG tablet TAKE 1 TABLET (20 MG TOTAL) BY MOUTH 3 (THREE) TIMES DAILY. SCHEDULE OFFICE VISIT (Patient taking differently: Take 20 mg by mouth 3 (three) times daily.)   [DISCONTINUED] polyethylene glycol (MIRALAX / GLYCOLAX) 17 g packet Take 17 g by mouth daily as needed for  moderate constipation.     Allergies:   Ace inhibitors, Black cherry fruit extract Marcelline Mates extract], [place]adine flavor [flavoring agent], and Chlorhexidine   Social History   Socioeconomic History   Marital status: Single    Spouse name: Not on file   Number of children: Not on file   Years of education: Not on file   Highest education level: Not on file  Occupational History   Not on file  Tobacco Use   Smoking status: Former    Packs/day: 1.00    Years: 36.00    Pack years: 36.00    Types: Cigarettes    Quit date: 01/08/2021    Years since quitting: 0.3   Smokeless tobacco: Never  Substance and Sexual Activity   Alcohol use: Yes    Alcohol/week: 1.0 standard drink    Types: 1 Shots of liquor per week  Comment: socially   Drug use: Yes    Types: Marijuana    Comment: every other week   Sexual activity: Not on file  Other Topics Concern   Not on file  Social History Narrative   Not on file   Social Determinants of Health   Financial Resource Strain: Not on file  Food Insecurity: Not on file  Transportation Needs: Not on file  Physical Activity: Not on file  Stress: Not on file  Social Connections: Not on file     Family History: The patient's family history includes Diabetes in his father and mother.  ROS:   Please see the history of present illness.     All other systems reviewed and are negative.  EKGs/Labs/Other Studies Reviewed:    The following studies were reviewed today: Echocardiogram 01/08/2021  1. Left ventricular ejection fraction, by estimation, is 60 to 65%. The  left ventricle has normal function. The left ventricle has no regional  wall motion abnormalities. There is mild left ventricular hypertrophy.  Left ventricular diastolic parameters  are consistent with Grade I diastolic dysfunction (impaired relaxation).   2. Right ventricular systolic function is normal. The right ventricular  size is normal.   3. The mitral valve is normal in  structure. No evidence of mitral valve  regurgitation. No evidence of mitral stenosis.   4. The aortic valve is normal in structure. Aortic valve regurgitation is  not visualized. No aortic stenosis is present.   5. Aortic dilatation noted. There is mild dilatation of the aortic root,  measuring 40 mm. There is borderline dilatation of the ascending aorta,  measuring 39 mm.   6. The inferior vena cava is normal in size with greater than 50%  respiratory variability, suggesting right atrial pressure of 3 mmHg.   EKG:  EKG is ordered today shows sinus tachycardia with a single block premature atrial contraction, otherwise normal tracing  Recent Labs: 04/29/2021: TSH 0.962 05/03/2021: Magnesium 2.4 05/11/2021: ALT 16; B Natriuretic Peptide 26.1; BUN 40; Creatinine, Ser 1.67; Hemoglobin 13.6; Platelets 235; Potassium 4.0; Sodium 138  Recent Lipid Panel    Component Value Date/Time   CHOL 189 07/11/2020 1103   TRIG 83 03/12/2021 0510   HDL 49 07/11/2020 1103   CHOLHDL 3.9 07/11/2020 1103   LDLCALC 109 (H) 07/11/2020 1103     Risk Assessment/Calculations:    CHA2DS2-VASc Score = 2   This indicates a 2.2% annual risk of stroke. The patient's score is based upon: CHF History: 1 HTN History: 1 Diabetes History: 0 Stroke History: 0 Vascular Disease History: 0 Age Score: 0 Gender Score: 0          Physical Exam:    VS:  BP (!) 95/58    Pulse (!) 116    Wt (!) 344 lb (156 kg)    SpO2 97%    BMI 45.39 kg/m     Wt Readings from Last 3 Encounters:  05/27/21 (!) 344 lb (156 kg)  05/15/21 (!) 337 lb 12.8 oz (153.2 kg)  05/07/21 (!) 331 lb 3.2 oz (150.2 kg)     General: Alert, oriented x3, no distress, morbidly obese Head: no evidence of trauma, PERRL, EOMI, no exophtalmos or lid lag, no myxedema, no xanthelasma; normal ears, nose.  Crowded oropharynx, nasal voice. Neck: Unable to evaluate jugular venous pulsations and no hepatojugular reflux; brisk carotid pulses without delay  and no carotid bruits Chest: clear to auscultation, no signs of consolidation by percussion or palpation, normal fremitus,  symmetrical and full respiratory excursions Cardiovascular: normal position and quality of the apical impulse, regular rhythm, normal first and second heart sounds, no murmurs, rubs or gallops Abdomen: no tenderness or distention, no masses by palpation, no abnormal pulsatility or arterial bruits, normal bowel sounds, no hepatosplenomegaly Extremities: no clubbing, cyanosis; 4+ tense bilateral edema of the feet, ankles, shins all the way to the knees; 2+ radial, ulnar and brachial pulses bilaterally; 2+ right femoral, posterior tibial and dorsalis pedis pulses; 2+ left femoral, posterior tibial and dorsalis pedis pulses; no subclavian or femoral bruits Neurological: grossly nonfocal Psych: Normal mood and affect    ASSESSMENT:    1. PAF (paroxysmal atrial fibrillation) (HCC)   2. Acquired thrombophilia (HCC)   3. Acute on chronic diastolic heart failure (HCC)   4. Acute on chronic respiratory failure with hypoxia and hypercapnia (HCC)   5. OSA (obstructive sleep apnea)   6. Morbid obesity (HCC)       PLAN:    In order of problems listed above:  Afib: Appropriately anticoagulated with Eliquis.  Currently in sinus rhythm.  On bisoprolol. Anticoagulation: No bleeding complications. CHF: Right greater than left biventricular failure.  Remains markedly hypervolemic.  His home health service reports that he often does not take the afternoon dose of furosemide.  We will consolidate his diuretic to a single daily dose of torsemide and continue metolazone twice weekly.  Hopefully will be able to avoid repeat hospitalization, especially since he is now using CPAP.Marland Kitchen  The most recent echocardiogram shows complete normalization of left ventricular systolic function.  We reviewed the importance of limiting sodium in his diet.  ACE inhibitor's were stopped when he developed  angioedema, although this has occurred independently of treatment with ACE inhibitor's as well and we are avoiding ARB and Entresto.  His hydralazine has been stopped but he still taking long-acting nitrates.  We will stop these as well, especially since it is unlikely he will be compliant with thrice daily medication dosing.  His blood pressure is relatively low.  Continue amlodipine, bisoprolol, spironolactone.  Would consider starting on Farxiga or Jardiance if we can somehow get these medications for him.  I do harbor concern regarding the risk of infectious complications in view of his prominent pannus, deep skin folds. Acute on chronic hypoxic/hypercapnic respiratory failure: He has not yet had PFTs or follow-up in the pulmonary clinic, although the PFTs have been ordered.  There is evidence of emphysema on his CT chest.  He likely has both obstructive sleep apnea and obesity hypoventilation syndrome.    Note that his PCO2 was almost 61 his pH was 7.34, venous CO2 30-33 consistent with chronic respiratory acidosis/hypercapnia (CO2 retainer).  Important for him to follow-up in the pulmonary clinic. OSA: Finally on CPAP.  I told him that he needs to wear this anytime he is asleep.  This includes all night long as well as any afternoon naps. Morbid obesity: Underlies many of his respiratory and metabolic problems         Medication Adjustments/Labs and Tests Ordered: Current medicines are reviewed at length with the patient today.  Concerns regarding medicines are outlined above.  Orders Placed This Encounter  Procedures   Brain natriuretic peptide   Basic metabolic panel   EKG 12-Lead     Meds ordered this encounter  Medications   torsemide (DEMADEX) 100 MG tablet    Sig: Take 1 tablet (100 mg total) by mouth daily.    Dispense:  30 tablet  Refill:  11    Stop the Furosemide       Patient Instructions  Medication Instructions:  STOP the Furosemide STOP the Isosorbide  START  Torsemide 100 mg once daily  *If you need a refill on your cardiac medications before your next appointment, please call your pharmacy*   Lab Work: Your provider would like for you to return in 2 weeks to have the following labs drawn: BMET and BNP. You do not need an appointment for the lab. Once in our office lobby there is a podium where you can sign in and ring the doorbell to alert [place] that you are here. The lab is open from 8:00 am to 4:30 pm; closed for lunch from 12:45pm-1:45pm.  If you have labs (blood work) drawn today and your tests are completely normal, you will receive your results only by: [place] (if you have MyChart) OR A paper copy in the mail If you have any lab test that is abnormal or we need to change your treatment, we will call you to review the results.   Testing/Procedures: None ordered   Follow-Up: At [facility], you and your health needs are our priority.  As part of our continuing mission to provide you with exceptional heart care, we have created designated Provider Care Teams.  These Care Teams include your primary Cardiologist (physician) and Advanced Practice Providers (APPs -  Physician Assistants and Nurse Practitioners) who all work together to provide you with the care you need, when you need it.  We recommend signing up for the patient portal called "MyChart".  Sign up information is provided on this After Visit Summary.  MyChart is used to connect with patients for Virtual Visits (Telemedicine).  Patients are able to view lab/test results, encounter notes, upcoming appointments, etc.  Non-urgent messages can be sent to your provider as well.   To learn more about what you can do with MyChart, go to [email or web address].    Your next appointment:   1 month(s)  The format for your next appointment:   In Person  Provider:   Dr. Sallyanne Kuster or APP {   Other Instructions Please be sure to wear your CPAP all night.     Signed, Sanda Klein, MD  05/28/2021 1:46 PM    [facility] He was most recently hospitalized November 1 - November 7 including 2 days of endotracheal intubation for acute respiratory failure

## 2021-05-28 ENCOUNTER — Encounter: Payer: Self-pay | Admitting: Cardiovascular Disease

## 2021-06-22 NOTE — Progress Notes (Unsigned)
Synopsis: Referred for decreased O2 saturation, restrictive lung disease, dyspnea by No ref. provider found  Subjective:   PATIENT ID: Zachary Hawkins GENDER: male DOB: 1955-06-24, MRN: [id number]  No chief complaint on file.  30yM with history of CHF (EF 40-45% now recovered, G2DD -> G1DD), moderate OSA not on CPAP since he couldn't afford it, AF on eliquis, obesity, smoking 15-20 years half ppd quit 01/04/21, ACEi angioedema 10/2017, anaphylaxis due to black cherries requiring hospitalization and intubation dc'd home with steroid taper 9/2, recent discharge from ICU 9/10 after giving himself an epi pen injection 9/7 after he felt his throat closing up after dinner. He was referred to allergy/immunology.   He has some DOE. Taking advair one puff BID. No cough especially since stopping smoking.   He never had any itching during either of the above episodes. Tryptase was negative.   He is finishing course of steroids.   Snores. Some PND. Some daytime sleepiness.   He had an episode yesterday where home oxygen cut off and it worried him.   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   Interval HPI: Last seen by me 05/07/21, went to ED 05/11/21, given prednisone/doxy. To cardiology 05/15/21 where he was 12 lb over dry weight (322).   Otherwise pertinent review of systems is negative.     Past Medical History:  Diagnosis Date   Cardiomyopathy ([place]) 06/2017   EF 40-45%   Chronic renal insufficiency, stage 3 (moderate) (HCC)    Diastolic dysfunction XX123456   grade 2    Hypertension    Hypertensive urgency    Obesity      Family History  Problem Relation Age of Onset   Diabetes Mother    Diabetes Father      No past surgical history on file.  Social History   Socioeconomic History   Marital status: Single    Spouse name: Not on file   Number of children: Not on file   Years of education: Not on file   Highest education level: Not on  file  Occupational History   Not on file  Tobacco Use   Smoking status: Former    Packs/day: 1.00    Years: 36.00    Pack years: 36.00    Types: Cigarettes    Quit date: 01/08/2021    Years since quitting: 0.4   Smokeless tobacco: Never  Substance and Sexual Activity   Alcohol use: Yes    Alcohol/week: 1.0 standard drink    Types: 1 Shots of liquor per week    Comment: socially   Drug use: Yes    Types: Marijuana    Comment: every other week   Sexual activity: Not on file  Other Topics Concern   Not on file  Social History Narrative   Not on file   Social Determinants of Health   Financial Resource Strain: Not on file  Food Insecurity: Not on file  Transportation Needs: Not on file  Physical Activity: Not on file  Stress: Not on file  Social Connections: Not on file  Intimate Partner Violence: Not on file     Allergies  Allergen Reactions   Ace Inhibitors Anaphylaxis   Black Cherry Fruit Extract Marcelline Mates Extract] Anaphylaxis    Tongue   Grenadine Flavor [Flavoring Agent] Anaphylaxis   Chlorhexidine Other (See Comments)    unknown     Outpatient Medications Prior to Visit  Medication Sig Dispense Refill  ADVAIR HFA 115-21 MCG/ACT inhaler INHALE 2 PUFFS INTO THE LUNGS 2 (TWO) TIMES DAILY. 36 g 12   albuterol (VENTOLIN HFA) 108 (90 Base) MCG/ACT inhaler Inhale 2 puffs into the lungs every 4 (four) hours as needed for wheezing or shortness of breath. 18 g 0   amLODipine (NORVASC) 5 MG tablet Take 1 tablet (5 mg total) by mouth daily. 30 tablet 0   apixaban (ELIQUIS) 5 MG TABS tablet Take 1 tablet (5 mg total) by mouth 2 (two) times daily. 60 tablet 5   atorvastatin (LIPITOR) 10 MG tablet Take 10 mg by mouth daily.     bisoprolol (ZEBETA) 5 MG tablet Take 1 tablet (5 mg total) by mouth daily. 90 tablet 3   EPINEPHrine 0.3 mg/0.3 mL IJ SOAJ injection Inject 0.3 mg into the muscle as needed for anaphylaxis. (Patient taking differently: Inject 0.3 mg into the muscle once  as needed for anaphylaxis.) 1 each 1   famotidine (PEPCID) 20 MG tablet Take 1 tablet (20 mg total) by mouth 2 (two) times daily. 28 tablet 0   metolazone (ZAROXOLYN) 2.5 MG tablet Take 1 tablet (2.5 mg total) by mouth 2 (two) times a week. 10 tablet 3   Multiple Vitamin (MULTIVITAMIN WITH MINERALS) TABS tablet Take 1 tablet by mouth daily.     potassium chloride SA (KLOR-CON M) 20 MEQ tablet Take 1 tablet (20 mEq total) by mouth 2 (two) times daily. 180 tablet 1   spironolactone (ALDACTONE) 25 MG tablet Take 1 tablet (25 mg total) by mouth daily. 90 tablet 3   tamsulosin (FLOMAX) 0.4 MG CAPS capsule Take 1 capsule (0.4 mg total) by mouth daily after supper. 30 capsule 2   torsemide (DEMADEX) 100 MG tablet Take 1 tablet (100 mg total) by mouth daily. 30 tablet 11   No facility-administered medications prior to visit.       Objective:   Physical Exam:  General appearance: 66 y.o., male, NAD, conversant  Eyes: anicteric sclerae; PERRL, tracking appropriately HENT: NCAT; MMM Neck: Trachea midline; no lymphadenopathy, no JVD Lungs: distant, no crackles, no wheeze, with normal respiratory effort on 2L O2 CV: RRR, no murmur  Abdomen: Soft, non-tender; non-distended, BS present  Extremities: 1+ nonpitting peripheral edema, warm Skin: Normal turgor and texture; no rash Psych: Appropriate affect Neuro: Alert and oriented to person and place, no focal deficit     There were no vitals filed for this visit.     on RA BMI Readings from Last 3 Encounters:  05/27/21 45.39 kg/m  05/15/21 44.57 kg/m  05/07/21 43.70 kg/m   Wt Readings from Last 3 Encounters:  05/27/21 (!) 344 lb (156 kg)  05/15/21 (!) 337 lb 12.8 oz (153.2 kg)  05/07/21 (!) 331 lb 3.2 oz (150.2 kg)     CBC    Component Value Date/Time   WBC 7.0 05/11/2021 1619   RBC 4.67 05/11/2021 1619   HGB 13.6 05/11/2021 1619   HGB 16.7 07/11/2020 1103   HCT 44.6 05/11/2021 1619   HCT 50.3 07/11/2020 1103   PLT 235  05/11/2021 1619   PLT 232 07/11/2020 1103   MCV 95.5 05/11/2021 1619   MCV 88 07/11/2020 1103   MCH 29.1 05/11/2021 1619   MCHC 30.5 05/11/2021 1619   RDW 14.4 05/11/2021 1619   RDW 13.6 07/11/2020 1103   LYMPHSABS 1.8 05/11/2021 1619   MONOABS 0.7 05/11/2021 1619   EOSABS 0.7 (H) 05/11/2021 1619   BASOSABS 0.1 05/11/2021 1619   C4 31 IgE  394 C1 inh above normal  Tryptase WNL  Chest Imaging: CXR 01/13/21 reviewed by me and unremarkable  CT A/P 04/29/21 lung bases reviewed by with RLL scar and lower lobe ?cysts   CTA Chest 05/11/21 reviewed by me with bronchial wall thickening, stable scar  Pulmonary Functions Testing Results: No flowsheet data found.    Echocardiogram:   TTE 01/08/21 EF 60-65%, G1DD  TTE 04/30/21 essentially normal     Assessment & Plan:   # Dyspnea on exertion: # OSA # HFpEF # Chronic hypoxic hypercapnic respiratory failure May be multifactorial from deconditioning, diastolic dysfunction (but looks well compensated today), undertreated sleep-disordered breathing, possible asthma/copd.   # Angioedema: possible that it is still related to ACEi even years from taking it. Not sure what to make of the elevated C1inh level.  # Smoking: Congratulated him on cessation. Does not qualify for lung cancer screening with <20 py smoking.  Plan: - Focused this visit on encouraging adherence to lasix 80 BID, metolazone 2.5 mg twice weekly, diligent use of CPAP at night and with naps - We will call to verify CPAP manufacturer to see if we can get download - check BMP next visit - DME supplier is [employer] - continue advair 2 puffs BID, encouraged to stop it 3 days prior to PFT next visit - weight loss encouraged - follow up with allergy for angioedema      Maryjane Hurter, MD [facility] 06/22/2021 4:31 PM

## 2021-06-23 ENCOUNTER — Ambulatory Visit: Payer: Medicaid Other | Admitting: Student

## 2021-06-23 ENCOUNTER — Emergency Department (HOSPITAL_COMMUNITY): Payer: Medicaid Other

## 2021-06-23 ENCOUNTER — Encounter (HOSPITAL_COMMUNITY): Payer: Self-pay

## 2021-06-23 ENCOUNTER — Telehealth: Payer: Self-pay

## 2021-06-23 ENCOUNTER — Ambulatory Visit (INDEPENDENT_AMBULATORY_CARE_PROVIDER_SITE_OTHER): Payer: Medicaid Other | Admitting: Student

## 2021-06-23 ENCOUNTER — Inpatient Hospital Stay (HOSPITAL_COMMUNITY)
Admission: EM | Admit: 2021-06-23 | Discharge: 2021-06-26 | DRG: 291 | Disposition: A | Discharge status: Home / Self Care | Payer: Medicaid Other | Attending: Internal Medicine | Admitting: Internal Medicine

## 2021-06-23 ENCOUNTER — Other Ambulatory Visit: Payer: Self-pay

## 2021-06-23 DIAGNOSIS — Z6841 Body Mass Index (BMI) 40.0 and over, adult: Secondary | ICD-10-CM

## 2021-06-23 DIAGNOSIS — E662 Morbid (severe) obesity with alveolar hypoventilation: Secondary | ICD-10-CM | POA: Diagnosis present

## 2021-06-23 DIAGNOSIS — Z91018 Allergy to other foods: Secondary | ICD-10-CM

## 2021-06-23 DIAGNOSIS — Z20822 Contact with and (suspected) exposure to covid-19: Secondary | ICD-10-CM | POA: Diagnosis present

## 2021-06-23 DIAGNOSIS — G4733 Obstructive sleep apnea (adult) (pediatric): Secondary | ICD-10-CM | POA: Diagnosis present

## 2021-06-23 DIAGNOSIS — I480 Paroxysmal atrial fibrillation: Secondary | ICD-10-CM | POA: Diagnosis present

## 2021-06-23 DIAGNOSIS — N400 Enlarged prostate without lower urinary tract symptoms: Secondary | ICD-10-CM | POA: Diagnosis present

## 2021-06-23 DIAGNOSIS — J9621 Acute and chronic respiratory failure with hypoxia: Secondary | ICD-10-CM | POA: Diagnosis not present

## 2021-06-23 DIAGNOSIS — R0609 Other forms of dyspnea: Secondary | ICD-10-CM

## 2021-06-23 DIAGNOSIS — Z86711 Personal history of pulmonary embolism: Secondary | ICD-10-CM

## 2021-06-23 DIAGNOSIS — I509 Heart failure, unspecified: Secondary | ICD-10-CM

## 2021-06-23 DIAGNOSIS — N179 Acute kidney failure, unspecified: Secondary | ICD-10-CM | POA: Diagnosis present

## 2021-06-23 DIAGNOSIS — J9622 Acute and chronic respiratory failure with hypercapnia: Secondary | ICD-10-CM | POA: Diagnosis present

## 2021-06-23 DIAGNOSIS — I5033 Acute on chronic diastolic (congestive) heart failure: Secondary | ICD-10-CM | POA: Diagnosis present

## 2021-06-23 DIAGNOSIS — Z79899 Other long term (current) drug therapy: Secondary | ICD-10-CM

## 2021-06-23 DIAGNOSIS — N1832 Chronic kidney disease, stage 3b: Secondary | ICD-10-CM | POA: Diagnosis present

## 2021-06-23 DIAGNOSIS — Z91199 Patient's noncompliance with other medical treatment and regimen due to unspecified reason: Secondary | ICD-10-CM

## 2021-06-23 DIAGNOSIS — I130 Hypertensive heart and chronic kidney disease with heart failure and stage 1 through stage 4 chronic kidney disease, or unspecified chronic kidney disease: Principal | ICD-10-CM | POA: Diagnosis present

## 2021-06-23 DIAGNOSIS — Z7951 Long term (current) use of inhaled steroids: Secondary | ICD-10-CM

## 2021-06-23 DIAGNOSIS — I5032 Chronic diastolic (congestive) heart failure: Secondary | ICD-10-CM

## 2021-06-23 DIAGNOSIS — Z9114 Patient's other noncompliance with medication regimen: Secondary | ICD-10-CM

## 2021-06-23 DIAGNOSIS — Z7901 Long term (current) use of anticoagulants: Secondary | ICD-10-CM

## 2021-06-23 DIAGNOSIS — I429 Cardiomyopathy, unspecified: Secondary | ICD-10-CM | POA: Diagnosis present

## 2021-06-23 DIAGNOSIS — E872 Acidosis, unspecified: Secondary | ICD-10-CM | POA: Diagnosis present

## 2021-06-23 DIAGNOSIS — J441 Chronic obstructive pulmonary disease with (acute) exacerbation: Secondary | ICD-10-CM | POA: Diagnosis present

## 2021-06-23 DIAGNOSIS — R0902 Hypoxemia: Secondary | ICD-10-CM

## 2021-06-23 DIAGNOSIS — Z9981 Dependence on supplemental oxygen: Secondary | ICD-10-CM

## 2021-06-23 DIAGNOSIS — I10 Essential (primary) hypertension: Secondary | ICD-10-CM | POA: Diagnosis present

## 2021-06-23 DIAGNOSIS — Z888 Allergy status to other drugs, medicaments and biological substances status: Secondary | ICD-10-CM

## 2021-06-23 DIAGNOSIS — Z87891 Personal history of nicotine dependence: Secondary | ICD-10-CM

## 2021-06-23 HISTORY — DX: Heart failure, unspecified: I50.9

## 2021-06-23 HISTORY — DX: Chronic obstructive pulmonary disease, unspecified: J44.9

## 2021-06-23 LAB — CBC WITH DIFFERENTIAL/PLATELET
Abs Immature Granulocytes: 0.13 10*3/uL — ABNORMAL HIGH (ref 0.00–0.07)
Basophils Absolute: 0.1 10*3/uL (ref 0.0–0.1)
Basophils Relative: 1 %
Eosinophils Absolute: 0.4 10*3/uL (ref 0.0–0.5)
Eosinophils Relative: 6 %
HCT: 46 % (ref 39.0–52.0)
Hemoglobin: 14.2 g/dL (ref 13.0–17.0)
Immature Granulocytes: 2 %
Lymphocytes Relative: 21 %
Lymphs Abs: 1.7 10*3/uL (ref 0.7–4.0)
MCH: 29.8 pg (ref 26.0–34.0)
MCHC: 30.9 g/dL (ref 30.0–36.0)
MCV: 96.4 fL (ref 80.0–100.0)
Monocytes Absolute: 0.8 10*3/uL (ref 0.1–1.0)
Monocytes Relative: 11 %
Neutro Abs: 4.8 10*3/uL (ref 1.7–7.7)
Neutrophils Relative %: 59 %
Platelets: 218 10*3/uL (ref 150–400)
RBC: 4.77 MIL/uL (ref 4.22–5.81)
RDW: 14.6 % (ref 11.5–15.5)
WBC: 7.9 10*3/uL (ref 4.0–10.5)
nRBC: 0 % (ref 0.0–0.2)

## 2021-06-23 LAB — COMPREHENSIVE METABOLIC PANEL
ALT: 12 U/L (ref 0–44)
AST: 25 U/L (ref 15–41)
Albumin: 3.3 g/dL — ABNORMAL LOW (ref 3.5–5.0)
Alkaline Phosphatase: 53 U/L (ref 38–126)
Anion gap: 8 (ref 5–15)
BUN: 16 mg/dL (ref 8–23)
CO2: 38 mmol/L — ABNORMAL HIGH (ref 22–32)
Calcium: 9 mg/dL (ref 8.9–10.3)
Chloride: 94 mmol/L — ABNORMAL LOW (ref 98–111)
Creatinine, Ser: 1.39 mg/dL — ABNORMAL HIGH (ref 0.61–1.24)
GFR, Estimated: 57 mL/min — ABNORMAL LOW (ref >60)
Glucose, Bld: 110 mg/dL — ABNORMAL HIGH (ref 70–99)
Potassium: 5.2 mmol/L — ABNORMAL HIGH (ref 3.5–5.1)
Sodium: 140 mmol/L (ref 135–145)
Total Bilirubin: 1.6 mg/dL — ABNORMAL HIGH (ref 0.3–1.2)
Total Protein: 6.3 g/dL — ABNORMAL LOW (ref 6.5–8.1)

## 2021-06-23 LAB — I-STAT ARTERIAL BLOOD GAS, ED
Acid-Base Excess: 13 mmol/L — ABNORMAL HIGH (ref 0.0–2.0)
Bicarbonate: 40.6 mmol/L — ABNORMAL HIGH (ref 20.0–28.0)
Calcium, Ion: 1.21 mmol/L (ref 1.15–1.40)
HCT: 41 % (ref 39.0–52.0)
Hemoglobin: 13.9 g/dL (ref 13.0–17.0)
O2 Saturation: 93 %
Patient temperature: 98.9
Potassium: 3.6 mmol/L (ref 3.5–5.1)
Sodium: 139 mmol/L (ref 135–145)
TCO2: 43 mmol/L — ABNORMAL HIGH (ref 22–32)
pCO2 arterial: 65.2 mmHg (ref 32–48)
pH, Arterial: 7.403 (ref 7.35–7.45)
pO2, Arterial: 70 mmHg — ABNORMAL LOW (ref 83–108)

## 2021-06-23 LAB — TROPONIN I (HIGH SENSITIVITY)
Troponin I (High Sensitivity): 17 ng/L (ref <18)
Troponin I (High Sensitivity): 17 ng/L (ref <18)

## 2021-06-23 LAB — RESP PANEL BY RT-PCR (FLU A&B, COVID) ARPGX2
Influenza A by PCR: NEGATIVE
Influenza B by PCR: NEGATIVE
SARS Coronavirus 2 by RT PCR: NEGATIVE

## 2021-06-23 LAB — BRAIN NATRIURETIC PEPTIDE: B Natriuretic Peptide: 27.3 pg/mL (ref 0.0–100.0)

## 2021-06-23 MED ORDER — POTASSIUM CHLORIDE CRYS ER 20 MEQ PO TBCR
20.0000 meq | EXTENDED_RELEASE_TABLET | Freq: Two times a day (BID) | ORAL | Status: DC
  Administered 2021-06-24 – 2021-06-26 (×6): 20 meq via ORAL
  Filled 2021-06-23 (×6): qty 1

## 2021-06-23 MED ORDER — BISOPROLOL FUMARATE 5 MG PO TABS
5.0000 mg | ORAL_TABLET | Freq: Every day | ORAL | Status: DC
  Administered 2021-06-24 – 2021-06-26 (×3): 5 mg via ORAL
  Filled 2021-06-23 (×4): qty 1

## 2021-06-23 MED ORDER — ONDANSETRON HCL 4 MG PO TABS: 4.0000 mg | ORAL_TABLET | Freq: Four times a day (QID) | ORAL | Status: DC | PRN

## 2021-06-23 MED ORDER — FLUTICASONE FUROATE-VILANTEROL 200-25 MCG/ACT IN AEPB
1.0000 | INHALATION_SPRAY | Freq: Every day | RESPIRATORY_TRACT | Status: DC
  Administered 2021-06-25 – 2021-06-26 (×3): 1 via RESPIRATORY_TRACT
  Filled 2021-06-23 (×2): qty 28

## 2021-06-23 MED ORDER — APIXABAN 5 MG PO TABS
5.0000 mg | ORAL_TABLET | Freq: Two times a day (BID) | ORAL | Status: DC
  Administered 2021-06-24 – 2021-06-26 (×5): 5 mg via ORAL
  Filled 2021-06-23 (×5): qty 1

## 2021-06-23 MED ORDER — ONDANSETRON HCL 4 MG/2ML IJ SOLN: 4.0000 mg | Freq: Four times a day (QID) | INTRAMUSCULAR | Status: DC | PRN

## 2021-06-23 MED ORDER — SENNOSIDES-DOCUSATE SODIUM 8.6-50 MG PO TABS: 1.0000 | ORAL_TABLET | Freq: Every evening | ORAL | Status: DC | PRN

## 2021-06-23 MED ORDER — SPIRONOLACTONE 25 MG PO TABS
25.0000 mg | ORAL_TABLET | Freq: Every day | ORAL | Status: DC
  Administered 2021-06-24 – 2021-06-26 (×3): 25 mg via ORAL
  Filled 2021-06-23 (×4): qty 1

## 2021-06-23 MED ORDER — ATORVASTATIN CALCIUM 10 MG PO TABS
10.0000 mg | ORAL_TABLET | Freq: Every day | ORAL | Status: DC
  Administered 2021-06-24 – 2021-06-26 (×3): 10 mg via ORAL
  Filled 2021-06-23 (×3): qty 1

## 2021-06-23 MED ORDER — FUROSEMIDE 10 MG/ML IJ SOLN
60.0000 mg | Freq: Two times a day (BID) | INTRAMUSCULAR | Status: DC
  Administered 2021-06-24 (×2): 60 mg via INTRAVENOUS
  Filled 2021-06-23 (×2): qty 6

## 2021-06-23 MED ORDER — ALBUTEROL SULFATE (2.5 MG/3ML) 0.083% IN NEBU: 2.5000 mg | INHALATION_SOLUTION | RESPIRATORY_TRACT | Status: DC | PRN

## 2021-06-23 MED ORDER — TAMSULOSIN HCL 0.4 MG PO CAPS
0.4000 mg | ORAL_CAPSULE | Freq: Every day | ORAL | Status: DC
  Administered 2021-06-24 – 2021-06-25 (×2): 0.4 mg via ORAL
  Filled 2021-06-23 (×2): qty 1

## 2021-06-23 MED ORDER — ALBUTEROL SULFATE HFA 108 (90 BASE) MCG/ACT IN AERS: 2.0000 | INHALATION_SPRAY | RESPIRATORY_TRACT | Status: DC | PRN

## 2021-06-23 MED ORDER — SODIUM CHLORIDE 0.9% FLUSH: 3.0000 mL | INTRAVENOUS | Status: DC | PRN

## 2021-06-23 MED ORDER — AMLODIPINE BESYLATE 5 MG PO TABS
5.0000 mg | ORAL_TABLET | Freq: Every day | ORAL | Status: DC
Start: 2021-06-24 — End: 2021-06-26
  Administered 2021-06-24 – 2021-06-26 (×3): 5 mg via ORAL
  Filled 2021-06-23 (×3): qty 1

## 2021-06-23 MED ORDER — SODIUM CHLORIDE 0.9% FLUSH
3.0000 mL | Freq: Two times a day (BID) | INTRAVENOUS | Status: DC
Start: 2021-06-23 — End: 2021-06-26
  Administered 2021-06-24 – 2021-06-26 (×6): 3 mL via INTRAVENOUS

## 2021-06-23 MED ORDER — ACETAMINOPHEN 650 MG RE SUPP: 650.0000 mg | Freq: Four times a day (QID) | RECTAL | Status: DC | PRN

## 2021-06-23 MED ORDER — ACETAMINOPHEN 325 MG PO TABS: 650.0000 mg | ORAL_TABLET | Freq: Four times a day (QID) | ORAL | Status: DC | PRN

## 2021-06-23 MED ORDER — SODIUM CHLORIDE 0.9 % IV SOLN: 250.0000 mL | INTRAVENOUS | Status: DC | PRN

## 2021-06-23 NOTE — ED Notes (Signed)
ED Provider at bedside. 

## 2021-06-23 NOTE — ED Notes (Signed)
This RN went to room after pt was arguing with NT about his bed and taking his stuff off. Pt began raising his voice at this RN and NT. This RN explained that he needed to lower his voice and talk to [place] with respect. Pt continued to raise his voice with this RN. This RN explained to him that this behavior is not tolerated and I will address his concerns when he will talk to me in an appropriate manner. Pt then began yelling at this RN for his oxygen being off. This RN reminded pt that he took his own oxygen off and he can put it back on. Pt then began to get upset, stating "look at me, I'm 400 pounds and look at this bed. You can't expect me to sit in this bed like this. I've been to this hospital before, this is ridiculous." This RN went to speak to EDP regarding this situation.

## 2021-06-23 NOTE — Assessment & Plan Note (Addendum)
Lasix given in ER with increase in base weight and venous congestion on CXR. Continue beta blocker, spironolactone. Monitor I&Os, daily weight.  Check serial troponin Check electrolytes and renal function in am

## 2021-06-23 NOTE — Assessment & Plan Note (Signed)
Continue norvasc, zebeta, spironolactone.  Monitor BP

## 2021-06-23 NOTE — Assessment & Plan Note (Signed)
Continue eliquis. Continue zebeta.

## 2021-06-23 NOTE — Patient Instructions (Addendum)
Patient was not able to perform. 

## 2021-06-23 NOTE — Assessment & Plan Note (Signed)
Will use Bipap overnight.  Pt encouraged to use CPAP at home as prescribed

## 2021-06-23 NOTE — Assessment & Plan Note (Addendum)
Zachary Hawkins is placed on medical telemetry for observation.  Supplemental oxygen provided in the ER. With hypercapnia on ABG will provide Bipap overnight. Will wean bipap as tolerated in am once hypercapnia improves.  Recheck ABG in am D-dimer pending. Pt reports he has not missed doses of eliquis but he does say he has hx of PE and with increased O2 demand and dyspnea will check for PE

## 2021-06-23 NOTE — ED Notes (Signed)
Patient transported to CT 

## 2021-06-23 NOTE — ED Provider Notes (Signed)
[facility] EMERGENCY DEPARTMENT Provider Note   CSN: [id number] Arrival date & time: 06/23/21  1731     History  Chief Complaint  Patient presents with   Seizures    Zachary Hawkins is a 66 y.o. male.   Seizures Patient presented with shortness of breath.  States he was told to come in by his pulmonologist.  States he was at home and went up to go the bathroom.  Began to feel more shaky.  Leaned against the door closed and was reported shaking in both arms and legs.  States his girlfriend told him that his oxygen was disconnected.  He is on chronic 3 L.  States he became somewhat improved after getting back on the oxygen but it took a little while.  Does not have a cough.  Some swelling in his legs.  States he is feeling better now compared to before.  No headache.  No confusion.  No trauma.  Patient states he was told to come in because he could be having a seizure.  Apparently had an episode at home and potentially again at the pulmonologist.    Home Medications Prior to Admission medications   Medication Sig Start Date End Date Taking? Authorizing Provider  ADVAIR HFA 115-21 MCG/ACT inhaler INHALE 2 PUFFS INTO THE LUNGS 2 (TWO) TIMES DAILY. 07/23/20   Charlott Rakes, MD  albuterol (VENTOLIN HFA) 108 (90 Base) MCG/ACT inhaler Inhale 2 puffs into the lungs every 4 (four) hours as needed for wheezing or shortness of breath. 02/13/21   Croitoru, Mihai, MD  amLODipine (NORVASC) 5 MG tablet Take 1 tablet (5 mg total) by mouth daily. 05/04/21 06/03/21  Darliss Cheney, MD  apixaban (ELIQUIS) 5 MG TABS tablet Take 1 tablet (5 mg total) by mouth 2 (two) times daily. 05/15/21 11/11/21  Loel Dubonnet, NP  atorvastatin (LIPITOR) 10 MG tablet Take 10 mg by mouth daily.    [provider]  bisoprolol (ZEBETA) 5 MG tablet Take 1 tablet (5 mg total) by mouth daily. 03/16/21   Pokhrel, Corrie Mckusick, MD  EPINEPHrine 0.3 mg/0.3 mL IJ SOAJ injection Inject 0.3 mg into the muscle as  needed for anaphylaxis. Patient taking differently: Inject 0.3 mg into the muscle once as needed for anaphylaxis. 01/09/21   Bonnielee Haff, MD  famotidine (PEPCID) 20 MG tablet Take 1 tablet (20 mg total) by mouth 2 (two) times daily. 01/17/21 01/17/22  Julian Hy, DO  metolazone (ZAROXOLYN) 2.5 MG tablet Take 1 tablet (2.5 mg total) by mouth 2 (two) times a week. 04/27/21 07/26/21  Croitoru, Mihai, MD  Multiple Vitamin (MULTIVITAMIN WITH MINERALS) TABS tablet Take 1 tablet by mouth daily. 01/10/21   Bonnielee Haff, MD  potassium chloride SA (KLOR-CON M) 20 MEQ tablet Take 1 tablet (20 mEq total) by mouth 2 (two) times daily. 05/15/21 11/11/21  Loel Dubonnet, NP  spironolactone (ALDACTONE) 25 MG tablet Take 1 tablet (25 mg total) by mouth daily. 04/07/21   Croitoru, Mihai, MD  tamsulosin (FLOMAX) 0.4 MG CAPS capsule Take 1 capsule (0.4 mg total) by mouth daily after supper. 03/16/21   Pokhrel, Corrie Mckusick, MD  torsemide (DEMADEX) 100 MG tablet Take 1 tablet (100 mg total) by mouth daily. 05/27/21   Croitoru, Mihai, MD      Allergies    Ace inhibitors, Black cherry fruit extract Marcelline Mates extract], Grenadine flavor [flavoring agent], and Chlorhexidine    Review of Systems   Review of Systems  Constitutional:  Negative for appetite change.  Respiratory:  Positive for shortness of breath.   Cardiovascular:  Negative for leg swelling.  Gastrointestinal:  Negative for abdominal pain.  Genitourinary:  Negative for enuresis.  Neurological:  Positive for seizures.   Physical Exam Updated Vital Signs BP (!) 136/116    Pulse (!) 106    Temp 98.9 F (37.2 C)    Resp 20    Ht 6\' 1"  (1.854 m)    Wt (!) 156.9 kg    SpO2 92%    BMI 45.65 kg/m  Physical Exam Vitals and nursing note reviewed.  Constitutional:      Appearance: Normal appearance.  HENT:     Head: Normocephalic.  Eyes:     Pupils: Pupils are equal, round, and reactive to light.  Pulmonary:     Breath sounds: No wheezing.  Abdominal:      Tenderness: There is no abdominal tenderness.  Musculoskeletal:        General: No tenderness.     Right lower leg: Edema present.     Left lower leg: Edema present.  Skin:    General: Skin is warm.  Neurological:     Mental Status: He is alert and oriented to person, place, and time.    ED Results / Procedures / Treatments   Labs (all labs ordered are listed, but only abnormal results are displayed) Labs Reviewed  COMPREHENSIVE METABOLIC PANEL - Abnormal; Notable for the following components:      Result Value   Potassium 5.2 (*)    Chloride 94 (*)    CO2 38 (*)    Glucose, Bld 110 (*)    Creatinine, Ser 1.39 (*)    Total Protein 6.3 (*)    Albumin 3.3 (*)    Total Bilirubin 1.6 (*)    GFR, Estimated 57 (*)    All other components within normal limits  CBC WITH DIFFERENTIAL/PLATELET - Abnormal; Notable for the following components:   Abs Immature Granulocytes 0.13 (*)    All other components within normal limits  BRAIN NATRIURETIC PEPTIDE  TROPONIN I (HIGH SENSITIVITY)  TROPONIN I (HIGH SENSITIVITY)    EKG EKG Interpretation  Date/Time:  Tuesday June 23 2021 17:44:28 EST Ventricular Rate:  83 PR Interval:  159 QRS Duration: 86 QT Interval:  354 QTC Calculation: 416 R Axis:   24 Text Interpretation: Sinus arrhythmia Probable anteroseptal infarct, old ST elevation suggests acute pericarditis Confirmed by Davonna Belling [id number] on 06/23/2021 9:51:01 PM  Radiology CT HEAD WO CONTRAST (5MM)  Result Date: 06/23/2021 CLINICAL DATA:  Delirium.  Convulsions and incontinence. EXAM: CT HEAD WITHOUT CONTRAST TECHNIQUE: Contiguous axial images were obtained from the base of the skull through the vertex without intravenous contrast. RADIATION DOSE REDUCTION: This exam was performed according to the departmental dose-optimization program which includes automated exposure control, adjustment of the mA and/or kV according to patient size and/or use of iterative reconstruction  technique. COMPARISON:  None. FINDINGS: Brain: No evidence of acute infarction, hemorrhage, hydrocephalus, extra-axial collection or mass lesion/mass effect. Vascular: No hyperdense vessel or unexpected calcification. Skull: Normal. Negative for fracture or focal lesion. Sinuses/Orbits: Partial opacification of the bilateral maxillary sinus, complete opacification of the ethmoid air cells as well as partial opacification of the sphenoid sinuses. Patchy opacification of the mastoid air cells. Other: None. IMPRESSION: 1.  No acute intracranial abnormality. 2. Severe paranasal sinus disease. No evidence of osseous erosion. Differential includes inflammatory, allergic as well as infectious process including fungal infection. Clinical correlation is suggested. Electronically Signed  By: Keane Police D.O.   On: 06/23/2021 19:29   DG Chest Portable 1 View  Result Date: 06/23/2021 CLINICAL DATA:  Shortness of breath EXAM: PORTABLE CHEST 1 VIEW COMPARISON:  05/11/2021 FINDINGS: Enlarged cardiomediastinal silhouette with mild central congestion. Patchy right greater than left basilar opacities. No pleural effusion or pneumothorax. IMPRESSION: 1. Cardiomegaly with mild central congestion 2. Streaky basilar opacities which may reflect atelectasis or pneumonia. Electronically Signed   By: Donavan Foil M.D.   On: 06/23/2021 18:23    Procedures Procedures    Medications Ordered in ED Medications - No data to display  ED Course/ Medical Decision Making/ A&P                           Medical Decision Making Problems Addressed: Congestive heart failure, unspecified HF chronicity, unspecified heart failure type [employer]): chronic illness or injury with exacerbation, progression, or side effects of treatment Hypoxia: acute illness or injury  Amount and/or Complexity of Data Reviewed External Data Reviewed: notes.    Details: p[revious admission note Labs: ordered. Decision-making details documented in ED  Course. Radiology: ordered and independent interpretation performed. Decision-making details documented in ED Course. ECG/medicine tests: independent interpretation performed. Decision-making details documented in ED Course. Discussion of management or test interpretation with external provider(s): hospitalist  Risk Decision regarding hospitalization.  Initial differential diagnosis for shortness of breath and shaking is long and includes COPD exacerbation, pneumonia, CHF.  Patient presents with shortness of breath and mental status change.  Was reportedly shaky and even went unconscious.  Both happened when he was off his oxygen.  1 at home and 1 at pulmonology offices.  Chest x-ray here independently interpreted by me and shows cardiomegaly and some central vascular congestion.  Denies new cough.  BNP reassuring.  Creatinine slightly elevated but at baseline.  Patient had a head CT that is also reassuring.  Doubt seizure activity.  Patient has taken off his oxygen a couple different times here.  Does appear to have some confusion.  Will desat when his oxygen comes off.  Ambulated with his home oxygen and had hypoxia.  Patient's weight appears to be up also.  Looks like baseline may be around 150 kg and is at 157.  With hypoxia I feels he could benefit from admission to the hospital.  We will start some Lasix.  Will discuss with hospitalist         Final Clinical Impression(s) / ED Diagnoses Final diagnoses:  Hypoxia  Congestive heart failure, unspecified HF chronicity, unspecified heart failure type [facility])    Rx / DC Orders ED Discharge Orders     None         Davonna Belling, MD 06/23/21 2153

## 2021-06-23 NOTE — ED Triage Notes (Signed)
Pt bib [facility] from pulmonary clinic appointment with complaints of 5 second convulsions and incontinence. During this episode, oxygen machine was unplugged. Pt had another episode of convulsions this morning at home while oxygen was unplugged as well. Pt states he remembers everything throughout episodes and says he was shaking on both sides of his body. Pt arrives AOx4 on 5L [place]. Pt 3L [place] at baseline EMS vitals:150/100, 80 HR, 24R,CBG 157

## 2021-06-23 NOTE — H&P (Signed)
History and Physical    Patient: Zachary Hawkins DOB: [birth year] DOA: 06/23/2021 DOS: the patient was seen and examined on 06/23/2021 PCP: Pcp, No  Patient coming from: Home  Chief Complaint:  Chief Complaint  Patient presents with   Seizures    HPI: Zachary Hawkins is a 66 y.o. male with medical history significant of Diastolic CHF, A-fib on eliquis, HTN, COPD, OSA but does use CPAP regularly who presents with episode of confusion and shaking.  Patient states that he was at home and took a nap on the couch and he woke up he had to lean against a door and was reportedly shaking in his arms and his legs and was not as responsive as he normally is per his girlfriend that was with him.  She noted that he did not have his home oxygen on.  He normally uses oxygen at 3 L/min.  Later in the day went to his pulmonologist clearance for an upcoming dental procedure and while there is oxygen was taken off to do breathing test and when this occurred he became hypoxic confused and had an episode of shaking.  He states he did not have any loss of consciousness but reportedly when he was sent from the pulmonology office concerned he may have had a seizure.  Patient has no history of seizures in the past.  While in the emergency room he has had periods of confusion and sometimes his answers do not correlate with the question that was asked.  He is currently on 5 L of oxygen by nasal cannula.  He denies having any chest pain or pressure.  He denies any fever or chills.  States he has a chronic cough but it is unchanged.  He has chronic edema of his legs which seems to be a little worse than normal.  He normally is 150 kg but today he is 156.9 kg.  He states he has been compliant with his medications.  He admits he does not use his CPAP nightly. He has a history of smoking cigarettes but quit 5 months ago.  He denies alcohol or illicit drug use.  While in the emergency room patient has been  hemodynamically stable.  He has a periods of intermittent confusion.  CT of the head was negative.  ABG revealed hypoxia and hypercapnia with normal pH.  He does have venous congestion on chest x-ray.  BNP is normal.  He does have 2+ pitting edema of his lower extremities.  Given dose of Lasix.  Hospitalist service is asked to evaluate and manage patient overnight  Review of Systems: As mentioned in the history of present illness. All other systems reviewed and are negative. Past Medical History:  Diagnosis Date   Cardiomyopathy ([place]) 06/2017   EF 40-45%   CHF (congestive heart failure) (HCC)    Chronic renal insufficiency, stage 3 (moderate) (HCC)    COPD (chronic obstructive pulmonary disease) (HCC)    Diastolic dysfunction [id number]   grade 2    Hypertension    Hypertensive urgency    Obesity    History reviewed. No pertinent surgical history. Social History:  reports that he quit smoking about 5 months ago. His smoking use included cigarettes. He has a 36.00 pack-year smoking history. He has never used smokeless tobacco. He reports current alcohol use of about 1.0 standard drink per week. He reports current drug use. Drug: Marijuana.  Allergies  Allergen Reactions   Ace Inhibitors Anaphylaxis   Black Cherry Fruit Extract [  Cherry Extract] Anaphylaxis    Tongue   Grenadine Flavor [Flavoring Agent] Anaphylaxis   Chlorhexidine Other (See Comments)    unknown    Family History  Problem Relation Age of Onset   Diabetes Mother    Diabetes Father     Prior to Admission medications   Medication Sig Start Date End Date Taking? Authorizing Provider  apixaban (ELIQUIS) 5 MG TABS tablet Take 1 tablet (5 mg total) by mouth 2 (two) times daily. 05/15/21 11/11/21 Yes Loel Dubonnet, NP  ADVAIR HFA 115-21 MCG/ACT inhaler INHALE 2 PUFFS INTO THE LUNGS 2 (TWO) TIMES DAILY. 07/23/20   Charlott Rakes, MD  albuterol (VENTOLIN HFA) 108 (90 Base) MCG/ACT inhaler Inhale 2 puffs into the lungs every 4  (four) hours as needed for wheezing or shortness of breath. 02/13/21   Croitoru, Mihai, MD  amLODipine (NORVASC) 5 MG tablet Take 1 tablet (5 mg total) by mouth daily. 05/04/21 06/03/21  Darliss Cheney, MD  atorvastatin (LIPITOR) 10 MG tablet Take 10 mg by mouth daily.    [provider]  bisoprolol (ZEBETA) 5 MG tablet Take 1 tablet (5 mg total) by mouth daily. 03/16/21   Pokhrel, Corrie Mckusick, MD  EPINEPHrine 0.3 mg/0.3 mL IJ SOAJ injection Inject 0.3 mg into the muscle as needed for anaphylaxis. Patient taking differently: Inject 0.3 mg into the muscle once as needed for anaphylaxis. 01/09/21   Bonnielee Haff, MD  famotidine (PEPCID) 20 MG tablet Take 1 tablet (20 mg total) by mouth 2 (two) times daily. 01/17/21 01/17/22  Julian Hy, DO  metolazone (ZAROXOLYN) 2.5 MG tablet Take 1 tablet (2.5 mg total) by mouth 2 (two) times a week. 04/27/21 07/26/21  Croitoru, Mihai, MD  Multiple Vitamin (MULTIVITAMIN WITH MINERALS) TABS tablet Take 1 tablet by mouth daily. 01/10/21   Bonnielee Haff, MD  potassium chloride SA (KLOR-CON M) 20 MEQ tablet Take 1 tablet (20 mEq total) by mouth 2 (two) times daily. 05/15/21 11/11/21  Loel Dubonnet, NP  spironolactone (ALDACTONE) 25 MG tablet Take 1 tablet (25 mg total) by mouth daily. 04/07/21   Croitoru, Mihai, MD  tamsulosin (FLOMAX) 0.4 MG CAPS capsule Take 1 capsule (0.4 mg total) by mouth daily after supper. 03/16/21   Pokhrel, Corrie Mckusick, MD  torsemide (DEMADEX) 100 MG tablet Take 1 tablet (100 mg total) by mouth daily. 05/27/21   Croitoru, Dani Gobble, MD    Physical Exam: Vitals:   06/23/21 1815 06/23/21 1845 06/23/21 1915 06/23/21 2138  BP: (!) 140/128 (!) 125/94 134/78 (!) 136/116  Pulse: 96 (!) 106 (!) 102 (!) 106  Resp: 13 18 20 20   Temp:    98.9 F (37.2 C)  TempSrc:      SpO2: 96% 94% 98% 92%  Weight:      Height:       General: WDWN, Alert and oriented x3.  Eyes: EOMI, PERRL, conjunctivae normal.  Sclera nonicteric HENT:  [place]/AT, external ears normal.   Nares patent without epistasis.  Mucous membranes are moist. Posterior pharynx clear of any exudate. Poor dentition.  Neck: Soft, normal range of motion, supple, no masses,  Trachea midline Respiratory: Equal but diminished breath sounds.  Diffuse scattered rales. no wheezing, no crackles. Normal respiratory effort. No accessory muscle use.  Cardiovascular: Irregularly irregular rhythm, normal rate. no murmurs / rubs / gallops. +2 lower extremity edema. Abdomen: Soft, no tenderness, no rebound or guarding.  No masses palpated. Morbid obesity. Bowel sounds normoactive Musculoskeletal: FROM. no cyanosis. No joint deformity upper and lower  extremities. Normal muscle tone.  Skin: Warm, dry, intact no rashes, lesions, ulcers. No induration Neurologic: CN 2-12 grossly intact.  Normal speech. Sensation intact to touch. Strength 5/5 in all extremities.   Psychiatric: Normal mood.    Data Reviewed: Lab work:    CBC unremarkable.  Troponin 17 initially and repeat troponin 17.  BNP 27.3 sodium 140 potassium 5.2 chloride 94 bicarb 38 creatinine 1.39 BUN 16 glucose 110 alkaline phosphatase 53 AST 25 ALT 12 albumin 3.3 total bilirubin 1.6  ABG shows pH of 7.403 with PCO2 of 65.2 PO2 of 70 bicarb 43  Chest x-ray shows cardiomegaly with central venous congestion.  Bibasilar streaky opacities consistent with atelectasis.  Less likely to be pneumonia with no fever and normal WBC  EKG shows atrial fibrillation with no acute ST elevation or depression.  QTc 416.  No changes from previous EKGs   Assessment and Plan: * Acute on chronic respiratory failure with hypoxia and hypercapnia ([place])- (present on admission) Mr. Pursifull is placed on medical telemetry for observation.  Supplemental oxygen provided in the ER. With hypercapnia on ABG will provide Bipap overnight. Will wean bipap as tolerated in am once hypercapnia improves.  Recheck ABG in am D-dimer pending. Pt reports he has not missed doses of eliquis but he  does say he has hx of PE and with increased O2 demand and dyspnea will check for PE  COPD with acute exacerbation (HCC) Continue advair and albuterol MDI.  Continue home O2 when hypercapnia, hypoxia resolves.  Chronic diastolic CHF (congestive heart failure) (HCC) Lasix given in ER with increase in base weight and venous congestion on CXR. Continue beta blocker, spironolactone. Monitor I&Os, daily weight.  Check serial troponin Check electrolytes and renal function in am  Essential hypertension- (present on admission) Continue norvasc, zebeta, spironolactone.  Monitor BP  Paroxysmal atrial fibrillation ([place])- (present on admission) Continue eliquis. Continue zebeta.   OSA (obstructive sleep apnea)- (present on admission) Will use Bipap overnight.  Pt encouraged to use CPAP at home as prescribed  CKD 3A Stable.   Advance Care Planning:   CODE STATUS: Full code    patient is anticoagulated on Eliquis which is continued  Family Communication: Diagnosis and plan discussed with patient.  Patient is agreement with plan.  Further recommendation follow as clinically indicated  Author: Eben Burow, MD 06/23/2021 11:35 PM  For on call review [email or web address].

## 2021-06-23 NOTE — ED Notes (Signed)
Pt repeatedly taking telemetry and oxygen off. This RN went into room to place pt back on monitor and instructed pt to stop taking everything off so we can monitor him adequately. Pt states "well do you know how hard it is to sit in this bed? I'm 400 freaking pounds and all this shit keeps pulling." Tele and O2 placed back on pt. Will continue to monitor.

## 2021-06-23 NOTE — Progress Notes (Signed)
Patient not able to perform PFT today. Patient have difficulty breathing without 02. Patient 02 dropping below 88% without 02. PFT started  and patient was not able to blow out long enough. Patient was given water per request. Patient states he started feeling a burning sensation, he then was in a frozen state. He was not able to respond to questions.Test ended.

## 2021-06-23 NOTE — ED Notes (Addendum)
Pt ambulated with oxygen  Pt oxygen dropped to 85 while ambulated

## 2021-06-23 NOTE — Assessment & Plan Note (Signed)
Continue advair and albuterol MDI.  Continue home O2 when hypercapnia, hypoxia resolves.

## 2021-06-23 NOTE — Telephone Encounter (Signed)
° °  Pre-operative Risk Assessment    Patient Name: Zachary Hawkins  DOB: Jun 13, 1955 MRN: [id number]     Request for Surgical Clearance    Procedure:  Dental Extraction - Amount of Teeth to be Pulled:  9 W/ALVEOLOPLASTY  Date of Surgery:  Clearance TBD                                 Surgeon:  Georgia Lopes, DMD., PA Surgeon's Group or Practice Name:  [facility] Phone number:  [phone] Fax number:  [phone]   Type of Clearance Requested:   - Medical    Type of Anesthesia:  Local  NITROUS   Additional requests/questions:

## 2021-06-24 DIAGNOSIS — Z7951 Long term (current) use of inhaled steroids: Secondary | ICD-10-CM | POA: Diagnosis not present

## 2021-06-24 DIAGNOSIS — I480 Paroxysmal atrial fibrillation: Secondary | ICD-10-CM | POA: Diagnosis present

## 2021-06-24 DIAGNOSIS — Z6841 Body Mass Index (BMI) 40.0 and over, adult: Secondary | ICD-10-CM | POA: Diagnosis not present

## 2021-06-24 DIAGNOSIS — N1832 Chronic kidney disease, stage 3b: Secondary | ICD-10-CM | POA: Diagnosis present

## 2021-06-24 DIAGNOSIS — N400 Enlarged prostate without lower urinary tract symptoms: Secondary | ICD-10-CM | POA: Diagnosis present

## 2021-06-24 DIAGNOSIS — Z87891 Personal history of nicotine dependence: Secondary | ICD-10-CM | POA: Diagnosis not present

## 2021-06-24 DIAGNOSIS — J441 Chronic obstructive pulmonary disease with (acute) exacerbation: Secondary | ICD-10-CM | POA: Diagnosis present

## 2021-06-24 DIAGNOSIS — I429 Cardiomyopathy, unspecified: Secondary | ICD-10-CM | POA: Diagnosis present

## 2021-06-24 DIAGNOSIS — Z91018 Allergy to other foods: Secondary | ICD-10-CM | POA: Diagnosis not present

## 2021-06-24 DIAGNOSIS — Z7901 Long term (current) use of anticoagulants: Secondary | ICD-10-CM | POA: Diagnosis not present

## 2021-06-24 DIAGNOSIS — Z9114 Patient's other noncompliance with medication regimen: Secondary | ICD-10-CM | POA: Diagnosis not present

## 2021-06-24 DIAGNOSIS — Z9981 Dependence on supplemental oxygen: Secondary | ICD-10-CM | POA: Diagnosis not present

## 2021-06-24 DIAGNOSIS — Z20822 Contact with and (suspected) exposure to covid-19: Secondary | ICD-10-CM | POA: Diagnosis present

## 2021-06-24 DIAGNOSIS — Z86711 Personal history of pulmonary embolism: Secondary | ICD-10-CM | POA: Diagnosis not present

## 2021-06-24 DIAGNOSIS — I130 Hypertensive heart and chronic kidney disease with heart failure and stage 1 through stage 4 chronic kidney disease, or unspecified chronic kidney disease: Secondary | ICD-10-CM | POA: Diagnosis present

## 2021-06-24 DIAGNOSIS — J9622 Acute and chronic respiratory failure with hypercapnia: Secondary | ICD-10-CM

## 2021-06-24 DIAGNOSIS — E662 Morbid (severe) obesity with alveolar hypoventilation: Secondary | ICD-10-CM | POA: Diagnosis present

## 2021-06-24 DIAGNOSIS — J9621 Acute and chronic respiratory failure with hypoxia: Secondary | ICD-10-CM

## 2021-06-24 DIAGNOSIS — R0902 Hypoxemia: Secondary | ICD-10-CM | POA: Diagnosis present

## 2021-06-24 DIAGNOSIS — I5032 Chronic diastolic (congestive) heart failure: Secondary | ICD-10-CM | POA: Diagnosis not present

## 2021-06-24 DIAGNOSIS — Z91199 Patient's noncompliance with other medical treatment and regimen due to unspecified reason: Secondary | ICD-10-CM | POA: Diagnosis not present

## 2021-06-24 DIAGNOSIS — E872 Acidosis, unspecified: Secondary | ICD-10-CM | POA: Diagnosis present

## 2021-06-24 DIAGNOSIS — Z888 Allergy status to other drugs, medicaments and biological substances status: Secondary | ICD-10-CM | POA: Diagnosis not present

## 2021-06-24 DIAGNOSIS — Z79899 Other long term (current) drug therapy: Secondary | ICD-10-CM | POA: Diagnosis not present

## 2021-06-24 DIAGNOSIS — I5033 Acute on chronic diastolic (congestive) heart failure: Secondary | ICD-10-CM | POA: Diagnosis present

## 2021-06-24 DIAGNOSIS — N179 Acute kidney failure, unspecified: Secondary | ICD-10-CM | POA: Diagnosis present

## 2021-06-24 LAB — I-STAT VENOUS BLOOD GAS, ED
Acid-Base Excess: 13 mmol/L — ABNORMAL HIGH (ref 0.0–2.0)
Bicarbonate: 44.9 mmol/L — ABNORMAL HIGH (ref 20.0–28.0)
Calcium, Ion: 1.23 mmol/L (ref 1.15–1.40)
HCT: 43 % (ref 39.0–52.0)
Hemoglobin: 14.6 g/dL (ref 13.0–17.0)
O2 Saturation: 45 %
Patient temperature: 98.5
Potassium: 4.1 mmol/L (ref 3.5–5.1)
Sodium: 137 mmol/L (ref 135–145)
TCO2: 48 mmol/L — ABNORMAL HIGH (ref 22–32)
pCO2, Ven: 98.3 mmHg (ref 44–60)
pH, Ven: 7.268 (ref 7.25–7.43)
pO2, Ven: 30 mmHg — CL (ref 32–45)

## 2021-06-24 LAB — I-STAT ARTERIAL BLOOD GAS, ED
Acid-Base Excess: 12 mmol/L — ABNORMAL HIGH (ref 0.0–2.0)
Bicarbonate: 43.6 mmol/L — ABNORMAL HIGH (ref 20.0–28.0)
Calcium, Ion: 1.24 mmol/L (ref 1.15–1.40)
HCT: 44 % (ref 39.0–52.0)
Hemoglobin: 15 g/dL (ref 13.0–17.0)
O2 Saturation: 97 %
Patient temperature: 98.9
Potassium: 4 mmol/L (ref 3.5–5.1)
Sodium: 139 mmol/L (ref 135–145)
TCO2: 47 mmol/L — ABNORMAL HIGH (ref 22–32)
pCO2 arterial: 98.6 mmHg (ref 32–48)
pH, Arterial: 7.254 — ABNORMAL LOW (ref 7.35–7.45)
pO2, Arterial: 115 mmHg — ABNORMAL HIGH (ref 83–108)

## 2021-06-24 LAB — POCT I-STAT 7, (LYTES, BLD GAS, ICA,H+H)
Acid-Base Excess: 13 mmol/L — ABNORMAL HIGH (ref 0.0–2.0)
Bicarbonate: 43.7 mmol/L — ABNORMAL HIGH (ref 20.0–28.0)
Calcium, Ion: 1.23 mmol/L (ref 1.15–1.40)
HCT: 43 % (ref 39.0–52.0)
Hemoglobin: 14.6 g/dL (ref 13.0–17.0)
O2 Saturation: 99 %
Potassium: 4.5 mmol/L (ref 3.5–5.1)
Sodium: 140 mmol/L (ref 135–145)
TCO2: 46 mmol/L — ABNORMAL HIGH (ref 22–32)
pCO2 arterial: 87.5 mmHg (ref 32–48)
pH, Arterial: 7.307 — ABNORMAL LOW (ref 7.35–7.45)
pO2, Arterial: 139 mmHg — ABNORMAL HIGH (ref 83–108)

## 2021-06-24 LAB — BASIC METABOLIC PANEL
Anion gap: 6 (ref 5–15)
BUN: 14 mg/dL (ref 8–23)
CO2: 40 mmol/L — ABNORMAL HIGH (ref 22–32)
Calcium: 8.6 mg/dL — ABNORMAL LOW (ref 8.9–10.3)
Chloride: 95 mmol/L — ABNORMAL LOW (ref 98–111)
Creatinine, Ser: 1.34 mg/dL — ABNORMAL HIGH (ref 0.61–1.24)
GFR, Estimated: 60 mL/min — ABNORMAL LOW (ref >60)
Glucose, Bld: 138 mg/dL — ABNORMAL HIGH (ref 70–99)
Potassium: 4.2 mmol/L (ref 3.5–5.1)
Sodium: 141 mmol/L (ref 135–145)

## 2021-06-24 LAB — CBC
HCT: 46.2 % (ref 39.0–52.0)
Hemoglobin: 13.6 g/dL (ref 13.0–17.0)
MCH: 28.9 pg (ref 26.0–34.0)
MCHC: 29.4 g/dL — ABNORMAL LOW (ref 30.0–36.0)
MCV: 98.3 fL (ref 80.0–100.0)
Platelets: 203 10*3/uL (ref 150–400)
RBC: 4.7 MIL/uL (ref 4.22–5.81)
RDW: 14.5 % (ref 11.5–15.5)
WBC: 7.8 10*3/uL (ref 4.0–10.5)
nRBC: 0 % (ref 0.0–0.2)

## 2021-06-24 LAB — D-DIMER, QUANTITATIVE: D-Dimer, Quant: 0.36 ug/mL-FEU (ref 0.00–0.50)

## 2021-06-24 MED ORDER — FLUTICASONE PROPIONATE 50 MCG/ACT NA SUSP
2.0000 | Freq: Two times a day (BID) | NASAL | Status: DC
  Administered 2021-06-24 – 2021-06-26 (×4): 2 via NASAL
  Filled 2021-06-24: qty 16

## 2021-06-24 MED ORDER — OXYMETAZOLINE HCL 0.05 % NA SOLN
2.0000 | Freq: Two times a day (BID) | NASAL | Status: DC
  Administered 2021-06-24 – 2021-06-26 (×4): 2 via NASAL
  Filled 2021-06-24: qty 30

## 2021-06-24 MED ORDER — CHLORHEXIDINE GLUCONATE CLOTH 2 % EX PADS
6.0000 | MEDICATED_PAD | Freq: Every day | CUTANEOUS | Status: DC
  Administered 2021-06-24 – 2021-06-25 (×2): 6 via TOPICAL

## 2021-06-24 MED ORDER — FUROSEMIDE 10 MG/ML IJ SOLN
80.0000 mg | Freq: Three times a day (TID) | INTRAMUSCULAR | Status: DC
  Administered 2021-06-24 – 2021-06-25 (×3): 80 mg via INTRAVENOUS
  Filled 2021-06-24 (×3): qty 8

## 2021-06-24 MED ORDER — CHLORHEXIDINE GLUCONATE 0.12 % MT SOLN
15.0000 mL | Freq: Two times a day (BID) | OROMUCOSAL | Status: DC
  Administered 2021-06-24 – 2021-06-26 (×5): 15 mL via OROMUCOSAL
  Filled 2021-06-24 (×5): qty 15

## 2021-06-24 MED ORDER — SALINE SPRAY 0.65 % NA SOLN
2.0000 | Freq: Two times a day (BID) | NASAL | Status: DC
  Administered 2021-06-24 – 2021-06-26 (×4): 2 via NASAL
  Filled 2021-06-24: qty 44

## 2021-06-24 MED ORDER — ORAL CARE MOUTH RINSE
15.0000 mL | Freq: Two times a day (BID) | OROMUCOSAL | Status: DC
  Administered 2021-06-24 – 2021-06-25 (×4): 15 mL via OROMUCOSAL

## 2021-06-24 NOTE — Progress Notes (Signed)
Pt. 7am abg done. Results given to RN. RN states she will notify MD.

## 2021-06-24 NOTE — Telephone Encounter (Addendum)
° °  Name: [name]  DOB: November 03, 1955  MRN: [id number]  Primary Cardiologist: Sanda Klein, MD  Chart reviewed as part of pre-operative protocol coverage. Because of Vinscent L Berber's past medical history and time since last visit, he will require a follow-up visit in order to better assess preoperative cardiovascular risk.  Patient was actively admitted to the hospital yesterday with acute respiratory failure and hypoxia and is being followed by the pulmonary/critical care team so we are unable to provide a clearance recommendation without following up with the patient after discharge. (FYI per H&P, "Later in the day went to his pulmonologist clearance for an upcoming dental procedure and while there is oxygen was taken off to do breathing test and when this occurred he became hypoxic confused and had an episode of shaking.")  Pre-op covering staff: - Closer to time of discharge, please schedule appointment and call patient to inform them. This is a fluid situation and dispo unknown at this time. High risk for decompensation per notes. - Please contact requesting surgeon's office via preferred method (i.e, phone, fax) to inform them of need for appointment prior to surgery.  Will route to pharmD team for preliminary input on holding apixaban but keep in mind this a fluid situation with hospitalization above, so will need to formally see patient in follow-up before including their final recommendations. SBE ppx can also be reviewed at time of follow-up visit.  Charlie Pitter, PA-C  06/24/2021, 11:01 AM

## 2021-06-24 NOTE — ED Notes (Signed)
MD notified of ABG changes and RT changes. Waiting for further orders. No acute changes noted. Pt resting on stretcher with BiPAP in place, respirations even and unlabored. Will continue to monitor.

## 2021-06-24 NOTE — ED Notes (Signed)
Pt is requesting portable 02 for home. What he has at home is too heavy to transport for pt.

## 2021-06-24 NOTE — Telephone Encounter (Signed)
Patient with diagnosis of afib on Eliquis for anticoagulation.    Procedure:  Dental Extraction - Amount of Teeth to be Pulled:  9 W/ALVEOLOPLASTY Date of procedure: TBD   CHA2DS2-VASc Score = 2   This indicates a 2.2% annual risk of stroke. The patient's score is based upon: CHF History: 1 HTN History: 1 Diabetes History: 0 Stroke History: 0 Vascular Disease History: 0 Age Score: 0 Gender Score: 0      CrCl 88 ml/min  Patient does NOT require pre-op antibiotics for dental procedure.  Per office protocol, patient can hold Eliquis for 1 day prior to procedure.    If patient's clinical status changes then clearance will need to be reviewed again.

## 2021-06-24 NOTE — Consult Note (Signed)
NAME:  Zachary Hawkins, MRN:  [id number], DOB:  Apr 13, 1956, LOS: 0 ADMISSION DATE:  06/23/2021, CONSULTATION DATE: 06/24/2021 REFERRING MD: Triad, CHIEF COMPLAINT: Acute on chronic hypercarbic hypoxic respiratory failure with altered mental status  History of Present Illness:  66 year old male with a history of struct of sleep apnea, obesity hypoventilation syndrome, paroxysmal atrial fibrillation, noncompliance with CPAP, oxygen, medications who presents with altered mental status with hypercarbia and acidotic with a pH of 7.24.  Pulmonary critical care called to bedside mask adjustments made on noninvasive which helped him wake up follow commands and therefore did not require acute intubation.  Repeat ABG will be taken approxi-1 hour post noninvasive mechanical Latoria support changes.  Note he has a history of substance abuse cocaine and marijuana.  He is noncompliant with medications.  He will be admitted to the intensive care unit for at least 24 hours for close monitoring.  We have undertaken an aggressive diuresis with monitoring of electrolytes.  06/24/2021  Pertinent  Medical History   Past Medical History:  Diagnosis Date   Cardiomyopathy ([place]) 06/2017   EF 40-45%   CHF (congestive heart failure) (HCC)    Chronic renal insufficiency, stage 3 (moderate) (HCC)    COPD (chronic obstructive pulmonary disease) (HCC)    Diastolic dysfunction [id number]   grade 2    Hypertension    Hypertensive urgency    Obesity      Significant Hospital Events: Including procedures, antibiotic start and stop dates in addition to other pertinent events   06/24/2021 on noninvasive mechanical ventilatory support for acute hypoxic hypercarbic respiratory failure  Interim History / Subjective:  Morbidly obese male he was in obvious respiratory distress interventions were made we will continue evaluate for possible intubation in near future.  Objective   Blood pressure (!) 144/108, pulse 89, temperature  98.9 F (37.2 C), resp. rate 20, height 6\' 1"  (1.854 m), weight (!) 156.9 kg, SpO2 100 %.    FiO2 (%):  [50 %] 50 %  No intake or output data in the 24 hours ending 06/24/21 [id number] Filed Weights   06/23/21 1741  Weight: (!) 156.9 kg    Examination: General: Morbidly obese male who comes more responsive as he is ventilated with noninvasive mechanical ventilatory support HENT: No JVD or lymphadenopathy is appreciated Lungs: Diminished breath sounds throughout Cardiovascular: Heart sounds are regular twelve-lead EKG revealed sinus arrhythmia Abdomen: Obese soft nontender Extremities: 1+ edema Neuro: Once stimulated follows commands and answers questions GU: [facility] Problem list     Assessment & Plan:  Acute on chronic hypercarbic and hypoxic respiratory failure in the setting of noncompliance with CPAP for his obstructive sleep apnea, not using oxygen as prescribed, reports being out of his diuretics.  Noted to have a pH of 7.24 and PCO2 of 98. Admit to the intensive care unit Noninvasive mechanical ventilatory support with the appropriate mask Repeat ABG 1 hour post changes with BiPAP Aggressive diuresis Oxygen therapy to keep sats greater than 92% He may need intubation if he fails the current interventions. Does not appear to be infected at this time.  Therefore no antimicrobial therapy. Monitor in intensive care unit for at least 24 hours  History of congestive heart failure, paroxysmal atrial fibrillation. Continue anticoagulation Monitor Aggressive diuresis Strict intake and output I suspect once he is aggressively diuresed his respiratory situation improved greatly.  Chronic renal insufficiency with a baseline creatinine of 1.39 Lab Results  Component Value Date   CREATININE 1.34 (H)  06/24/2021   CREATININE 1.39 (H) 06/23/2021   CREATININE 1.67 (H) 05/11/2021   Monitor creatinine Avoid nephrotoxins Serial electrolytes  Altered mental status Most  likely secondary to hypercarbia CT of the head was unremarkable History of substance abuse we will check urine drug screen  Best Practice (right click and "Reselect all SmartList Selections" daily)   Diet/type: Regular consistency (see orders) DVT prophylaxis: DOAC GI prophylaxis: PPI Lines: N/A Foley:  N/A Code Status:  full code Last date of multidisciplinary goals of care discussion [tbd]  Labs   CBC: Recent Labs  Lab 06/23/21 1759 06/23/21 2314 06/24/21 0531 06/24/21 0643  WBC 7.9  --  7.8  --   NEUTROABS 4.8  --   --   --   HGB 14.2 13.9 13.6 15.0  HCT 46.0 41.0 46.2 44.0  MCV 96.4  --  98.3  --   PLT 218  --  203  --     Basic Metabolic Panel: Recent Labs  Lab 06/23/21 1759 06/23/21 2314 06/24/21 0531 06/24/21 0643  NA 140 139 141 139  K 5.2* 3.6 4.2 4.0  CL 94*  --  95*  --   CO2 38*  --  40*  --   GLUCOSE 110*  --  138*  --   BUN 16  --  14  --   CREATININE 1.39*  --  1.34*  --   CALCIUM 9.0  --  8.6*  --    GFR: Estimated Creatinine Clearance: 88.3 mL/min (A) (by C-G formula based on SCr of 1.34 mg/dL (H)). Recent Labs  Lab 06/23/21 1759 06/24/21 0531  WBC 7.9 7.8    Liver Function Tests: Recent Labs  Lab 06/23/21 1759  AST 25  ALT 12  ALKPHOS 53  BILITOT 1.6*  PROT 6.3*  ALBUMIN 3.3*   No results for input(s): LIPASE, AMYLASE in the last 168 hours. No results for input(s): AMMONIA in the last 168 hours.  ABG    Component Value Date/Time   PHART 7.254 (L) 06/24/2021 0643   PCO2ART 98.6 (HH) 06/24/2021 0643   PO2ART 115 (H) 06/24/2021 0643   HCO3 43.6 (H) 06/24/2021 0643   TCO2 47 (H) 06/24/2021 0643   ACIDBASEDEF 2.0 03/12/2021 0557   O2SAT 97 06/24/2021 0643     Coagulation Profile: No results for input(s): INR, PROTIME in the last 168 hours.  Cardiac Enzymes: No results for input(s): CKTOTAL, CKMB, CKMBINDEX, TROPONINI in the last 168 hours.  HbA1C: No results found for: HGBA1C  CBG: No results for input(s): GLUCAP  in the last 168 hours.  Review of Systems:   na  Past Medical History:  He,  has a past medical history of Cardiomyopathy ([place]) (06/2017), CHF (congestive heart failure) ([place]), Chronic renal insufficiency, stage 3 (moderate) ([place]), COPD (chronic obstructive pulmonary disease) ([place]), Diastolic dysfunction ([id number]), Hypertension, Hypertensive urgency, and Obesity.   Surgical History:  History reviewed. No pertinent surgical history.   Social History:   reports that he quit smoking about 5 months ago. His smoking use included cigarettes. He has a 36.00 pack-year smoking history. He has never used smokeless tobacco. He reports current alcohol use of about 1.0 standard drink per week. He reports current drug use. Drug: Marijuana.   Family History:  His family history includes Diabetes in his father and mother.   Allergies Allergies  Allergen Reactions   Ace Inhibitors Anaphylaxis   Black Cherry Fruit Extract Marcelline Mates Extract] Anaphylaxis    Art gallery manager  Agent] Anaphylaxis   Chlorhexidine Other (See Comments)    unknown     Home Medications  Prior to Admission medications   Medication Sig Start Date End Date Taking? Authorizing Provider  ADVAIR HFA 115-21 MCG/ACT inhaler INHALE 2 PUFFS INTO THE LUNGS 2 (TWO) TIMES DAILY. 07/23/20  Yes Charlott Rakes, MD  albuterol (VENTOLIN HFA) 108 (90 Base) MCG/ACT inhaler Inhale 2 puffs into the lungs every 4 (four) hours as needed for wheezing or shortness of breath. 02/13/21  Yes Croitoru, Mihai, MD  amLODipine (NORVASC) 5 MG tablet Take 1 tablet (5 mg total) by mouth daily. 05/04/21 06/24/21 Yes Pahwani, Einar Grad, MD  apixaban (ELIQUIS) 5 MG TABS tablet Take 1 tablet (5 mg total) by mouth 2 (two) times daily. 05/15/21 11/11/21 Yes Loel Dubonnet, NP  atorvastatin (LIPITOR) 10 MG tablet Take 10 mg by mouth daily.    [provider]  bisoprolol (ZEBETA) 5 MG tablet Take 1 tablet (5 mg total) by mouth daily. 03/16/21    Pokhrel, Corrie Mckusick, MD  EPINEPHrine 0.3 mg/0.3 mL IJ SOAJ injection Inject 0.3 mg into the muscle as needed for anaphylaxis. Patient taking differently: Inject 0.3 mg into the muscle once as needed for anaphylaxis. 01/09/21   Bonnielee Haff, MD  famotidine (PEPCID) 20 MG tablet Take 1 tablet (20 mg total) by mouth 2 (two) times daily. 01/17/21 01/17/22  Julian Hy, DO  metolazone (ZAROXOLYN) 2.5 MG tablet Take 1 tablet (2.5 mg total) by mouth 2 (two) times a week. 04/27/21 07/26/21  Croitoru, Mihai, MD  Multiple Vitamin (MULTIVITAMIN WITH MINERALS) TABS tablet Take 1 tablet by mouth daily. 01/10/21   Bonnielee Haff, MD  potassium chloride SA (KLOR-CON M) 20 MEQ tablet Take 1 tablet (20 mEq total) by mouth 2 (two) times daily. 05/15/21 11/11/21  Loel Dubonnet, NP  spironolactone (ALDACTONE) 25 MG tablet Take 1 tablet (25 mg total) by mouth daily. 04/07/21   Croitoru, Mihai, MD  tamsulosin (FLOMAX) 0.4 MG CAPS capsule Take 1 capsule (0.4 mg total) by mouth daily after supper. 03/16/21   Pokhrel, Corrie Mckusick, MD  torsemide (DEMADEX) 100 MG tablet Take 1 tablet (100 mg total) by mouth daily. 05/27/21   Croitoru, Dani Gobble, MD     Critical care time: 34 min    Richardson Landry Myranda Pavone ACNP Acute Care Nurse Practitioner [place] Please consult Amion 06/24/2021, 8:21 AM

## 2021-06-24 NOTE — ED Notes (Signed)
Pt removed Bipap by self, states "I'm done with that." During bipap use pt oxygen saturations maintained 94%-98% and was placed back on 6LNC. Patient oxygen fluctuating between 80%-92% on this setting. Patient reports he is comfortable on this. Will consult RT again.

## 2021-06-24 NOTE — Progress Notes (Signed)
Pt. Transported from ED to 2H12. No complications

## 2021-06-24 NOTE — ED Notes (Signed)
Breakfast Orders Placed °

## 2021-06-24 NOTE — Progress Notes (Signed)
Pt. Ripped his bipap mask off and is refusing to wear mask. RN at bedside.

## 2021-06-24 NOTE — ED Notes (Signed)
Rt consulted for low oxygen saturation. During movement/sleeping patient is desatting to low 80% on 6 liters [place]. RT to come see pt.

## 2021-06-24 NOTE — ED Notes (Signed)
Pt placed back on bipap

## 2021-06-24 NOTE — ED Notes (Signed)
RT at bedside. Critical Care NP at bedside.

## 2021-06-24 NOTE — Telephone Encounter (Signed)
I will send update to requesting office to please see notes from Ronie Spies, [facility] pre op provider today. Pt is currently admitted.

## 2021-06-24 NOTE — Telephone Encounter (Signed)
As below, patient actively admitted, will need post-hospital visit with cardiology to review pre-op clearance. The recommendations below from pharm are only preliminary based on current clinical status, will need final review at time of OV. Callback team is following peripherally in their box and will help arrange f/u at time of hospital discharge (we discussed case today and chart routed earlier).  Will remove from preop APP box.

## 2021-06-25 ENCOUNTER — Telehealth: Payer: Self-pay | Admitting: Nurse Practitioner

## 2021-06-25 DIAGNOSIS — J9621 Acute and chronic respiratory failure with hypoxia: Secondary | ICD-10-CM | POA: Diagnosis not present

## 2021-06-25 DIAGNOSIS — J441 Chronic obstructive pulmonary disease with (acute) exacerbation: Secondary | ICD-10-CM | POA: Diagnosis not present

## 2021-06-25 DIAGNOSIS — J9622 Acute and chronic respiratory failure with hypercapnia: Secondary | ICD-10-CM | POA: Diagnosis not present

## 2021-06-25 DIAGNOSIS — I5032 Chronic diastolic (congestive) heart failure: Secondary | ICD-10-CM | POA: Diagnosis not present

## 2021-06-25 LAB — CBC WITH DIFFERENTIAL/PLATELET
Abs Immature Granulocytes: 0.13 10*3/uL — ABNORMAL HIGH (ref 0.00–0.07)
Basophils Absolute: 0.1 10*3/uL (ref 0.0–0.1)
Basophils Relative: 1 %
Eosinophils Absolute: 0.6 10*3/uL — ABNORMAL HIGH (ref 0.0–0.5)
Eosinophils Relative: 7 %
HCT: 43.7 % (ref 39.0–52.0)
Hemoglobin: 13 g/dL (ref 13.0–17.0)
Immature Granulocytes: 2 %
Lymphocytes Relative: 25 %
Lymphs Abs: 2 10*3/uL (ref 0.7–4.0)
MCH: 29 pg (ref 26.0–34.0)
MCHC: 29.7 g/dL — ABNORMAL LOW (ref 30.0–36.0)
MCV: 97.5 fL (ref 80.0–100.0)
Monocytes Absolute: 0.9 10*3/uL (ref 0.1–1.0)
Monocytes Relative: 11 %
Neutro Abs: 4.5 10*3/uL (ref 1.7–7.7)
Neutrophils Relative %: 54 %
Platelets: 193 10*3/uL (ref 150–400)
RBC: 4.48 MIL/uL (ref 4.22–5.81)
RDW: 14.5 % (ref 11.5–15.5)
WBC: 8.3 10*3/uL (ref 4.0–10.5)
nRBC: 0 % (ref 0.0–0.2)

## 2021-06-25 LAB — COMPREHENSIVE METABOLIC PANEL
ALT: 17 U/L (ref 0–44)
AST: 16 U/L (ref 15–41)
Albumin: 3 g/dL — ABNORMAL LOW (ref 3.5–5.0)
Alkaline Phosphatase: 54 U/L (ref 38–126)
Anion gap: 8 (ref 5–15)
BUN: 24 mg/dL — ABNORMAL HIGH (ref 8–23)
CO2: 40 mmol/L — ABNORMAL HIGH (ref 22–32)
Calcium: 8.7 mg/dL — ABNORMAL LOW (ref 8.9–10.3)
Chloride: 91 mmol/L — ABNORMAL LOW (ref 98–111)
Creatinine, Ser: 1.85 mg/dL — ABNORMAL HIGH (ref 0.61–1.24)
GFR, Estimated: 40 mL/min — ABNORMAL LOW (ref >60)
Glucose, Bld: 131 mg/dL — ABNORMAL HIGH (ref 70–99)
Potassium: 4.1 mmol/L (ref 3.5–5.1)
Sodium: 139 mmol/L (ref 135–145)
Total Bilirubin: 0.2 mg/dL — ABNORMAL LOW (ref 0.3–1.2)
Total Protein: 6.3 g/dL — ABNORMAL LOW (ref 6.5–8.1)

## 2021-06-25 LAB — RAPID URINE DRUG SCREEN, HOSP PERFORMED
Amphetamines: NOT DETECTED
Barbiturates: NOT DETECTED
Benzodiazepines: NOT DETECTED
Cocaine: NOT DETECTED
Opiates: NOT DETECTED
Tetrahydrocannabinol: NOT DETECTED

## 2021-06-25 LAB — POCT I-STAT 7, (LYTES, BLD GAS, ICA,H+H)
Acid-Base Excess: 12 mmol/L — ABNORMAL HIGH (ref 0.0–2.0)
Bicarbonate: 41.3 mmol/L — ABNORMAL HIGH (ref 20.0–28.0)
Calcium, Ion: 1.21 mmol/L (ref 1.15–1.40)
HCT: 42 % (ref 39.0–52.0)
Hemoglobin: 14.3 g/dL (ref 13.0–17.0)
O2 Saturation: 89 %
Patient temperature: 98.3
Potassium: 4 mmol/L (ref 3.5–5.1)
Sodium: 135 mmol/L (ref 135–145)
TCO2: 44 mmol/L — ABNORMAL HIGH (ref 22–32)
pCO2 arterial: 77.7 mmHg (ref 32–48)
pH, Arterial: 7.333 — ABNORMAL LOW (ref 7.35–7.45)
pO2, Arterial: 64 mmHg — ABNORMAL LOW (ref 83–108)

## 2021-06-25 LAB — PHOSPHORUS: Phosphorus: 4.5 mg/dL (ref 2.5–4.6)

## 2021-06-25 LAB — MAGNESIUM: Magnesium: 2.1 mg/dL (ref 1.7–2.4)

## 2021-06-25 MED ORDER — TORSEMIDE 20 MG PO TABS
100.0000 mg | ORAL_TABLET | Freq: Every day | ORAL | Status: DC
  Administered 2021-06-25 – 2021-06-26 (×2): 100 mg via ORAL
  Filled 2021-06-25 (×2): qty 5

## 2021-06-25 NOTE — Progress Notes (Signed)
NAME:  Zachary Hawkins, MRN:  [id number], DOB:  [birth year], LOS: 1 ADMISSION DATE:  06/23/2021, CONSULTATION DATE: 06/24/2021 REFERRING MD: Triad, CHIEF COMPLAINT: Acute on chronic hypercarbic hypoxic respiratory failure with altered mental status  History of Present Illness:  66 year old male with a history of struct of sleep apnea, obesity hypoventilation syndrome, paroxysmal atrial fibrillation, noncompliance with CPAP, oxygen, medications who presents with altered mental status with hypercarbia and acidotic with a pH of 7.24.  Pulmonary critical care called to bedside mask adjustments made on noninvasive which helped him wake up follow commands and therefore did not require acute intubation.  Repeat ABG will be taken approxi-1 hour post noninvasive mechanical Latoria support changes.  Note he has a history of substance abuse cocaine and marijuana.  He is noncompliant with medications.  He will be admitted to the intensive care unit for at least 24 hours for close monitoring.  We have undertaken an aggressive diuresis with monitoring of electrolytes.  06/24/2021  Pertinent  Medical History   Past Medical History:  Diagnosis Date   Cardiomyopathy ([place]) 06/2017   EF 40-45%   CHF (congestive heart failure) (HCC)    Chronic renal insufficiency, stage 3 (moderate) (HCC)    COPD (chronic obstructive pulmonary disease) (HCC)    Diastolic dysfunction [id number]   grade 2    Hypertension    Hypertensive urgency    Obesity    Significant Hospital Events: Including procedures, antibiotic start and stop dates in addition to other pertinent events   06/24/2021 on noninvasive mechanical ventilatory support for acute hypoxic hypercarbic respiratory failure  Interim History / Subjective:  Sitting in bedside recliner - no complaints, asking he might be able to come home  Objective   Blood pressure (!) 119/92, pulse 85, temperature 98.3 F (36.8 C), temperature source Oral, resp. rate 20, height 6\' 1"   (1.854 m), weight (!) 156.9 kg, SpO2 96 %.        Intake/Output Summary (Last 24 hours) at 06/25/2021 1223 Last data filed at 06/25/2021 1100 Gross per 24 hour  Intake 250 ml  Output 1600 ml  Net -1350 ml   Filed Weights   06/23/21 1741 06/25/21 0500  Weight: (!) 156.9 kg (!) 156.9 kg   Examination: General:  Morbidly obese male sitting in bedside recliner in NAD HEENT: MM pink/moist Neuro: Alert, oriented, MAE CV: rr, distant heart sounds PULM:  non labored, clear and diminished, on 4L [place], no wheezing GI: protuberant, soft, bs active, voids Extremities: warm/dry, no LE edema  Skin: no rashes   UOP 1.4L +2 unmeasured urinary occurrences  Wt 156.9> 156.9 unchanged Afebrile   Labs Na 139, K 4.1, Cl 91, bicarb 40, sCr 1.34 > 1.85, albumin 3, protein 6.3  Resolved Hospital Problem list     Assessment & Plan:  Acute on chronic hypercarbic and hypoxic respiratory failure secondary to acute pulmonary edema due to acute on chronic heart failure exacerbation, underlying OSH and OSA, and medical and CPAP non-compliance  - on HOT 3-4L Asthma vs COPD, no formal PFTs yet P:  - Continue BiPAP for naps and qHS or if confused  - previously qualified in 11/22 for home NIV.  Unclear if he has CPAP vs trilogy vent at home.  Either way, non-compliant.  Will need to verify prior to d/c home to ensure he has what he needs - diuresis as below - continue home O2, goal 88-94% - ongoing pulm hygiene  - continue Breo - seen by Dr. Verlee Monte in office,  will need f/u appt and PFTs  History of congestive heart failure, paroxysmal atrial fibrillation, HTN - remains in NSR - continue eliquis - cont norvasc, bisoprolol, lipitor and changing lasix to home torsemide with spiro - strict I/Os, daily wts   Chronic renal insufficiency with a baseline creatinine of 1.39 Lab Results  Component Value Date   CREATININE 1.85 (H) 06/25/2021   CREATININE 1.34 (H) 06/24/2021   CREATININE 1.39 (H) 06/23/2021    - sCr up today - trend renal indices/ strict I/Os - BMET in am   Altered mental status secondary to hypercarbia  Hx cocaine abuse - AMS resolved  - CT of the head was unremarkable - pending UDS  Morbid obesity- BMI 45 - RDN for HF and weight loss education - would benefit greatly from weight loss, will consult RDN for dietary education for HF and weight loss   Best Practice (right click and "Reselect all SmartList Selections" daily)   Diet/type: Regular consistency (see orders)- heart healthy DVT prophylaxis: DOAC GI prophylaxis: PPI Lines: N/A Foley:  N/A Code Status:  full code Last date of multidisciplinary goals of care discussion [tbd]   Labs   CBC: Recent Labs  Lab 06/23/21 1759 06/23/21 2314 06/24/21 0531 06/24/21 0643 06/24/21 0948 06/24/21 1410 06/25/21 0145  WBC 7.9  --  7.8  --   --   --  8.3  NEUTROABS 4.8  --   --   --   --   --  4.5  HGB 14.2   < > 13.6 15.0 14.6 14.6 13.0  HCT 46.0   < > 46.2 44.0 43.0 43.0 43.7  MCV 96.4  --  98.3  --   --   --  97.5  PLT 218  --  203  --   --   --  193   < > = values in this interval not displayed.    Basic Metabolic Panel: Recent Labs  Lab 06/23/21 1759 06/23/21 2314 06/24/21 0531 06/24/21 0643 06/24/21 0948 06/24/21 1410 06/25/21 0145  NA 140   < > 141 139 137 140 139  K 5.2*   < > 4.2 4.0 4.1 4.5 4.1  CL 94*  --  95*  --   --   --  91*  CO2 38*  --  40*  --   --   --  40*  GLUCOSE 110*  --  138*  --   --   --  131*  BUN 16  --  14  --   --   --  24*  CREATININE 1.39*  --  1.34*  --   --   --  1.85*  CALCIUM 9.0  --  8.6*  --   --   --  8.7*  MG  --   --   --   --   --   --  2.1  PHOS  --   --   --   --   --   --  4.5   < > = values in this interval not displayed.   GFR: Estimated Creatinine Clearance: 64 mL/min (A) (by C-G formula based on SCr of 1.85 mg/dL (H)). Recent Labs  Lab 06/23/21 1759 06/24/21 0531 06/25/21 0145  WBC 7.9 7.8 8.3    Liver Function Tests: Recent Labs  Lab  06/23/21 1759 06/25/21 0145  AST 25 16  ALT 12 17  ALKPHOS 53 54  BILITOT 1.6* 0.2*  PROT 6.3* 6.3*  ALBUMIN 3.3*  3.0*   No results for input(s): LIPASE, AMYLASE in the last 168 hours. No results for input(s): AMMONIA in the last 168 hours.  ABG    Component Value Date/Time   PHART 7.307 (L) 06/24/2021 1410   PCO2ART 87.5 (HH) 06/24/2021 1410   PO2ART 139 (H) 06/24/2021 1410   HCO3 43.7 (H) 06/24/2021 1410   TCO2 46 (H) 06/24/2021 1410   ACIDBASEDEF 2.0 03/12/2021 0557   O2SAT 99 06/24/2021 1410     Coagulation Profile: No results for input(s): INR, PROTIME in the last 168 hours.  Cardiac Enzymes: No results for input(s): CKTOTAL, CKMB, CKMBINDEX, TROPONINI in the last 168 hours.  HbA1C: No results found for: HGBA1C  CBG: No results for input(s): GLUCAP in the last 168 hours.        Kennieth Rad, ACNP [facility] 06/25/2021, 12:23 PM  See Amion for pager If no response to pager, please call PCCM consult pager After 7:00 pm call Elink

## 2021-06-25 NOTE — TOC Initial Note (Addendum)
Transition of Care [facility]) - Initial/Assessment Note    Patient Details  Name: Zachary Hawkins MRN: [id number] Date of Birth: 05-22-1955  Transition of Care [facility]) CM/SW Contact:    Gala Lewandowsky, RN Phone Number: 06/25/2021, 3:30 PM  Clinical Narrative: Risk for readmission assessment completed. PTA patient was from home alone. Patient states he has support of his children and they check on him often. Patient states he has durable medical equipment cane, rolling walker, oxygen (liter flow 3-4 liters at home) and NIV via [employer]. Patient states he has transportation to appointments and he gets his medications without any issues. Patient states he has [employer] [facility] in the home at least five days a week from 2:30-5:30 pm. Patient states [facility] comes out 3 days a week for vital signs. Patient states he is in need for a scale- Case Manager to see if the patient is eligible for scale via the Heart Failure [facility]. Patient will benefit from PT/OT consult for recommendations. Case Manager will continue to follow the patient as he progresses.                Expected Discharge Plan: Home w Home Health Services Barriers to Discharge: Continued Medical Work up   Patient Goals and CMS Choice Patient states their goals for this hospitalization and ongoing recovery are:: Patient would like to return home once stable.      Expected Discharge Plan and Services Expected Discharge Plan: Home w Home Health Services In-house Referral: NA Discharge Planning Services: CM Consult Post Acute Care Choice: Home Health Living arrangements for the past 2 months: Single Family Home                   DME Agency: NA                  Prior Living Arrangements/Services Living arrangements for the past 2 months: Single Family Home Lives with:: Self Patient language and need for interpreter reviewed:: Yes Do you feel safe going back to the place where you live?: Yes       Need for Family Participation in Patient Care: Yes (Comment) Care giver support system in place?: Yes (comment) Current home services: DME (Patient has oxygen and NIV with [employer], Cane, shower chair, rolling walker.) Criminal Activity/Legal Involvement Pertinent to Current Situation/Hospitalization: No - Comment as needed  Activities of Daily Living      Permission Sought/Granted Permission sought to share information with : Family Supports, Magazine features editor, Case Manager                Emotional Assessment Appearance:: Appears stated age Attitude/Demeanor/Rapport: Engaged Affect (typically observed): Appropriate Orientation: : Oriented to Situation, Oriented to  Time, Oriented to Place, Oriented to Self Alcohol / Substance Use: Not Applicable Psych Involvement: No (comment)  Admission diagnosis:  Hypoxia [R09.02] Acute on chronic respiratory failure with hypoxia (HCC) [J96.21] Acute on chronic respiratory failure with hypoxia and hypercapnia (HCC) [J96.21, J96.22] Congestive heart failure, unspecified HF chronicity, unspecified heart failure type (HCC) [I50.9] Patient Active Problem List   Diagnosis Date Noted   Acute on chronic respiratory failure with hypoxia and hypercapnia (HCC) 06/23/2021   Chronic diastolic CHF (congestive heart failure) (HCC) 06/23/2021   COPD with acute exacerbation (HCC) 06/23/2021   Acute on chronic diastolic (congestive) heart failure (HCC) 04/30/2021   AKI (acute kidney injury) (HCC) 04/29/2021   Acute exacerbation of CHF (congestive heart failure) (HCC) 04/29/2021   Chronic respiratory  failure with hypercapnia (HCC) 04/29/2021   Bilateral hydrocele 04/29/2021   Dyspnea on exertion 04/29/2021   Acute respiratory failure with hypoxia (HCC) 03/10/2021   Urticaria 01/15/2021   Chronic heart failure with preserved ejection fraction (HCC)    Angioedema 01/04/2021   Influenza vaccine refused 06/26/2020   COVID-19 vaccine series  completed 06/26/2020   Paroxysmal atrial fibrillation (HCC) 07/02/2019   Secondary hypercoagulable state (HCC) 07/02/2019   OSA (obstructive sleep apnea) 04/03/2019   History of angioedema due to ACE INHIBITORS 10/13/2017   Non compliance w medication regimen 07/13/2017   Essential hypertension 06/23/2017   Obesity with serious comorbidity    Cardiomyopathy (HCC) 06/06/2017   Dyspnea 06/05/2017   PCP:  Oneita Hurt No Pharmacy:   [facility] - [place], [place] - [address] [place] [address] Phone: [phone] Fax: [phone]   Readmission Risk Interventions Readmission Risk Prevention Plan 06/25/2021 05/04/2021 03/13/2021  Transportation Screening Complete Complete Complete  PCP or Specialist Appt within 3-5 Days - Complete -  HRI or Home Care Consult - Complete -  Social Work Consult for Recovery Care Planning/Counseling - Complete -  Palliative Care Screening - Not Applicable -  Medication Review Oceanographer) Complete Complete Complete  PCP or Specialist appointment within 3-5 days of discharge Complete - Complete  HRI or Home Care Consult Complete - Complete  SW Recovery Care/Counseling Consult Complete - Complete  Palliative Care Screening Not Applicable - Not Applicable  Skilled Nursing Facility Complete - Complete  Some recent data might be hidden

## 2021-06-26 ENCOUNTER — Telehealth: Payer: Self-pay | Admitting: Student

## 2021-06-26 DIAGNOSIS — J9622 Acute and chronic respiratory failure with hypercapnia: Secondary | ICD-10-CM | POA: Diagnosis not present

## 2021-06-26 DIAGNOSIS — J9621 Acute and chronic respiratory failure with hypoxia: Secondary | ICD-10-CM | POA: Diagnosis not present

## 2021-06-26 LAB — BASIC METABOLIC PANEL WITH GFR
Anion gap: 7 (ref 5–15)
BUN: 26 mg/dL — ABNORMAL HIGH (ref 8–23)
CO2: 40 mmol/L — ABNORMAL HIGH (ref 22–32)
Calcium: 9 mg/dL (ref 8.9–10.3)
Chloride: 92 mmol/L — ABNORMAL LOW (ref 98–111)
Creatinine, Ser: 1.6 mg/dL — ABNORMAL HIGH (ref 0.61–1.24)
GFR, Estimated: 48 mL/min — ABNORMAL LOW
Glucose, Bld: 133 mg/dL — ABNORMAL HIGH (ref 70–99)
Potassium: 4.3 mmol/L (ref 3.5–5.1)
Sodium: 139 mmol/L (ref 135–145)

## 2021-06-26 LAB — BLOOD GAS, VENOUS
Acid-Base Excess: 14.4 mmol/L — ABNORMAL HIGH (ref 0.0–2.0)
Bicarbonate: 44.2 mmol/L — ABNORMAL HIGH (ref 20.0–28.0)
Drawn by: 6344
FIO2: 50 %
O2 Saturation: 80.3 %
Patient temperature: 37
pCO2, Ven: 82 mmHg (ref 44–60)
pH, Ven: 7.34 (ref 7.25–7.43)
pO2, Ven: 54 mmHg — ABNORMAL HIGH (ref 32–45)

## 2021-06-26 NOTE — Progress Notes (Signed)
Heart Failure Nurse Navigator Progress Note  Echo Dec 2022= EF 60-65%, normal diastolic function  Main issue respiratory failure, DX with HFpEF, diuresed   Heart & Vascular TOC appt sch for 2/27  Meredith Staggers, RN, BSN, [facility] Heart Failure Navigator Heart & Vascular Care Navigation Team

## 2021-06-26 NOTE — Telephone Encounter (Signed)
Called patient to inform him that his oxygen tank is at the office. He is able to come today before 5pm to pick it up if he can. The oxygen tank is in A pod with a sign in it near the printer. Nothing further

## 2021-06-26 NOTE — TOC Transition Note (Addendum)
Transition of Care [facility]) - CM/SW Discharge Note   Patient Details  Name: KINGSLEE MAIRENA MRN: [id number] Date of Birth: 10-Apr-1956  Transition of Care [facility] CM/SW Contact:  Gala Lewandowsky, RN Phone Number: 06/26/2021, 1:01 PM   Clinical Narrative: Case Manager received a secure chat from the attending that the patient will be ready for discharge home today. Case Manager did speak with the patient and verified sons number to verify if NIV is working in the home. [employer] will have respiratory therapy meet the patient and son at the home (provider is good with letting him go to meet RT at home). Son did bring portable tanks to the room; however, no regulator was attached. Case Manager called [employer] Liaison and he will bring the regulator to the room prior to transition. Case Manager reached out to [facility] to see if she can assist with delivering a scale to the patient. Case Manager stressed the importance of compliance with NIV and oxygen. No further needs identified at this time.   1324 06-26-21 Case Manager spoke with Crystal TOC Case Manager with [facility]- they educate the patient regarding diet, respiratory, exercise, medication and disease management. [facility] sends Paramedicine to the home 3 x a week to monitor weight and v/s. Case Manager unable to add [facility] RN at this time. Patient is not homebound. [facility] is aware that the patient will transition home today.    Final next level of care: Home/Self Care Barriers to Discharge: Continued Medical Work up   Patient Goals and CMS Choice Patient states their goals for this hospitalization and ongoing recovery are:: Patient would like to return home once stable.    Discharge Plan and Services In-house Referral: NA Discharge Planning Services: CM Consult Post Acute Care Choice: Home Health            DME Agency: NA     Readmission Risk Interventions Readmission Risk Prevention Plan 06/25/2021 05/04/2021  03/13/2021  Transportation Screening Complete Complete Complete  PCP or Specialist Appt within 3-5 Days - Complete -  HRI or Home Care Consult - Complete -  Social Work Consult for Recovery Care Planning/Counseling - Complete -  Palliative Care Screening - Not Applicable -  Medication Review [employer]) Complete Complete Complete  PCP or Specialist appointment within 3-5 days of discharge Complete - Complete  HRI or Home Care Consult Complete - Complete  SW Recovery Care/Counseling Consult Complete - Complete  Palliative Care Screening Not Applicable - Not Applicable  Skilled Nursing Facility Complete - Complete  Some recent data might be hidden

## 2021-06-26 NOTE — Plan of Care (Signed)
Nutrition Education Note  RD consulted for nutrition education regarding heart failure   Pt sitting up in recliner chair during visit. Per pt, pt endorses a good appetite today and has been consuming all of his meals since admission. 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 Pt reports that he rarely eats out because of the production it takes from him to get out of the house, but reports that sometimes he gets a biscuit from [place]. Dietetic intern obtained 24-hour recall from pt.   24-hour recall:  Breakfast: Scrambled eggs, or bacon or sausage, egg and cheese biscuit from [employer] Lunch: Sandwich on brioche bread with meat, lettuce, tomato, mayo, and cheese (typically prepared at home)  Dinner: some type of starch, vegetables, and meat HS snack: [place] ice Beverages: Juices such as Tropical fruit punch (food-lion brand)  Per pt, pt likes vegetables such as spinach and onions and wants to do a better job of eating more fresh fruits and vegetables. Pt states that he has 16 grandchildren and wants to be around for at least another 52 years so he states that he understands that he needs to make some healthier eating habits. Pt reports that he is planning to cut back on juice and eat less greasy, fried foods. Pt discussed plans to bake and saute his meats. Pt also reports that he started recently eating oatmeal again and is going to start having this for breakfast.   RD provided "Low Sodium Nutrition Therapy" handout from the Academy of Nutrition and Dietetics. Reviewed patient's dietary recall. Provided examples on ways to decrease sodium intake in diet. Discouraged intake of processed foods and use of salt shaker. Dietetic intern encouraged use of fresh herbs and spices for seasoning foods. Encouraged fresh fruits and vegetables as well as whole grain sources of carbohydrates to maximize fiber intake.   RD discussed why it is important for  patient to adhere to diet recommendations, and emphasized the role of fluids, foods to avoid, and importance of weighing self daily. Teach back method used.  Expect good compliance.  Body mass index is 45.43 kg/m. Pt meets criteria for morbid obesity based on current BMI.  Current diet order is low sodium, heart healthy, patient is consuming approximately 80-100% of meals at this time. Labs and medications reviewed. No further nutrition interventions warranted at this time. If additional nutrition issues arise, please re-consult RD.    Maryruth Hancock, Dietetic Intern 06/26/2021 11:45 AM

## 2021-06-26 NOTE — Telephone Encounter (Signed)
Patient is scheduled for hospital follow up on 07/09/2021 at 1:45pm with Dr. Thora Lance.

## 2021-06-26 NOTE — Discharge Summary (Signed)
[facility]  Physician Discharge Summary   Patient ID: Zachary Hawkins MRN: [id number] DOB/AGE: September 02, 1955 66 y.o.  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  They will make appointment.  Home Health: Services resumed Equipment/Devices: Patient already has noninvasive ventilator at home  CODE STATUS: Full code  DISCHARGE CONDITION: fair  Diet recommendation: As before  INITIAL HISTORY: 66 year old male with a history of struct of sleep apnea, obesity hypoventilation syndrome, paroxysmal atrial fibrillation, noncompliance with CPAP, oxygen, medications who presents with altered mental status with hypercarbia and acidotic with a pH of 7.24.  Pulmonary critical care called to bedside mask adjustments made on noninvasive which helped him wake up follow commands and therefore did not require acute intubation.  Repeat ABG will be taken approxi-1 hour post noninvasive mechanical Latoria support changes.  Note he has a history of substance abuse cocaine and marijuana.  He is noncompliant with medications.    HOSPITAL COURSE:  Patient apparently has been noncompliant with his noninvasive ventilator.  He was hospitalized to the ICU.  Placed on BiPAP.  Was placed on diuretics.  He was subsequently weaned off of BiPAP.  He diuresed well.  He started feeling better and was back to his baseline.  He ambulated in the hallway.  He has home oxygen.  He has all his medications at home.  He was seen by pulmonology this morning.  They feel that patient is stable to return home.  Patient wants to go home.  Case manager was involved to assist patient with his ventilator at home.  They will arrange a  visit by the respiratory therapist.  Would also look into the possibility of adding a humidifier.  Other medical issues are all stable.  He was told to be compliant with his medications.  Discussed with the son over the phone.  Recent echocardiogram from December showed normal systolic function.  Morbid obesity Estimated body mass index is 45.43 kg/m as calculated from the following:   Height as of this encounter: 6\' 1"  (1.854 m).   Weight as of this encounter: 156.2 kg.  Patient is stable.  Okay for discharge home.  His son is here from [place] and will be staying with him for several days.   PERTINENT LABS:  The results of significant diagnostics from this hospitalization (including imaging, microbiology, ancillary and laboratory) are listed below for reference.    Microbiology: Recent Results (from the past 240 hour(s))  Resp Panel by RT-PCR (Flu A&B, Covid) Nasopharyngeal Swab     Status: None   Collection Time: 06/23/21  9:56 PM   Specimen: Nasopharyngeal Swab; Nasopharyngeal(NP) swabs in vial transport medium  Result Value Ref Range Status   SARS Coronavirus 2 by RT PCR NEGATIVE NEGATIVE Final    Comment: (NOTE) SARS-CoV-2 target nucleic acids are NOT DETECTED.  The SARS-CoV-2 RNA is generally detectable in upper respiratory specimens during the acute phase of infection. The lowest concentration of SARS-CoV-2 viral copies this assay can detect is 138 copies/mL. A negative result does not preclude SARS-Cov-2 infection and should not be used as the sole basis for treatment or other patient management decisions. A negative result may occur with  improper specimen collection/handling, submission of specimen other than nasopharyngeal swab, presence of viral  mutation(s) within the areas targeted by this assay, and inadequate number of viral copies(<138 copies/mL). A negative result must be combined with clinical observations, patient history, and  epidemiological information. The expected result is Negative.  Fact Sheet for Patients:  EntrepreneurPulse.com.au  Fact Sheet for Healthcare Providers:  IncredibleEmployment.be  This test is no t yet approved or cleared by the [place] FDA and  has been authorized for detection and/or diagnosis of SARS-CoV-2 by FDA under an Emergency Use Authorization (EUA). This EUA will remain  in effect (meaning this test can be used) for the duration of the COVID-19 declaration under Section 564(b)(1) of the Act, 21 U.S.C.section 360bbb-3(b)(1), unless the authorization is terminated  or revoked sooner.       Influenza A by PCR NEGATIVE NEGATIVE Final   Influenza B by PCR NEGATIVE NEGATIVE Final    Comment: (NOTE) The Xpert Xpress SARS-CoV-2/FLU/RSV plus assay is intended as an aid in the diagnosis of influenza from Nasopharyngeal swab specimens and should not be used as a sole basis for treatment. Nasal washings and aspirates are unacceptable for Xpert Xpress SARS-CoV-2/FLU/RSV testing.  Fact Sheet for Patients: EntrepreneurPulse.com.au  Fact Sheet for Healthcare Providers: IncredibleEmployment.be  This test is not yet approved or cleared by the [place] FDA and has been authorized for detection and/or diagnosis of SARS-CoV-2 by FDA under an Emergency Use Authorization (EUA). This EUA will remain in effect (meaning this test can be used) for the duration of the COVID-19 declaration under Section 564(b)(1) of the Act, 21 U.S.C. section 360bbb-3(b)(1), unless the authorization is terminated or revoked.  Performed at [facility], [place] [address] [place], [place] [address]      Labs:  COVID-19 Labs    Lab Results  Component Value Date   SARSCOV2NAA NEGATIVE 06/23/2021   [place] NEGATIVE 05/11/2021   [place] NEGATIVE 04/29/2021   [place] NEGATIVE 03/10/2021      Basic  Metabolic Panel: Recent Labs  Lab 06/23/21 1759 06/23/21 2314 06/24/21 0531 06/24/21 0643 06/24/21 0948 06/24/21 1410 06/25/21 0145 06/25/21 1230 06/26/21 0104  NA 140   < > 141   < > 137 140 139 135 139  K 5.2*   < > 4.2   < > 4.1 4.5 4.1 4.0 4.3  CL 94*  --  95*  --   --   --  91*  --  92*  CO2 38*  --  40*  --   --   --  40*  --  40*  GLUCOSE 110*  --  138*  --   --   --  131*  --  133*  BUN 16  --  14  --   --   --  24*  --  26*  CREATININE 1.39*  --  1.34*  --   --   --  1.85*  --  1.60*  CALCIUM 9.0  --  8.6*  --   --   --  8.7*  --  9.0  MG  --   --   --   --   --   --  2.1  --   --   PHOS  --   --   --   --   --   --  4.5  --   --    < > = values in this interval not displayed.   Liver Function Tests: Recent Labs  Lab 06/23/21 1759 06/25/21 0145  AST 25 16  ALT 12  17  ALKPHOS 53 54  BILITOT 1.6* 0.2*  PROT 6.3* 6.3*  ALBUMIN 3.3* 3.0*    CBC: Recent Labs  Lab 06/23/21 1759 06/23/21 2314 06/24/21 0531 06/24/21 0643 06/24/21 0948 06/24/21 1410 06/25/21 0145 06/25/21 1230  WBC 7.9  --  7.8  --   --   --  8.3  --   NEUTROABS 4.8  --   --   --   --   --  4.5  --   HGB 14.2   < > 13.6 15.0 14.6 14.6 13.0 14.3  HCT 46.0   < > 46.2 44.0 43.0 43.0 43.7 42.0  MCV 96.4  --  98.3  --   --   --  97.5  --   PLT 218  --  203  --   --   --  193  --    < > = values in this interval not displayed.    BNP: BNP (last 3 results) Recent Labs    04/29/21 1010 05/11/21 1619 06/23/21 1759  BNP 31.0 26.1 27.3      IMAGING STUDIES CT HEAD WO CONTRAST (5MM)  Result Date: 06/23/2021 CLINICAL DATA:  Delirium.  Convulsions and incontinence. EXAM: CT HEAD WITHOUT CONTRAST TECHNIQUE: Contiguous axial images were obtained from the base of the skull through the vertex without intravenous contrast. RADIATION DOSE REDUCTION: This exam was performed according to the departmental dose-optimization program which includes automated exposure control, adjustment of the mA and/or  kV according to patient size and/or use of iterative reconstruction technique. COMPARISON:  None. FINDINGS: Brain: No evidence of acute infarction, hemorrhage, hydrocephalus, extra-axial collection or mass lesion/mass effect. Vascular: No hyperdense vessel or unexpected calcification. Skull: Normal. Negative for fracture or focal lesion. Sinuses/Orbits: Partial opacification of the bilateral maxillary sinus, complete opacification of the ethmoid air cells as well as partial opacification of the sphenoid sinuses. Patchy opacification of the mastoid air cells. Other: None. IMPRESSION: 1.  No acute intracranial abnormality. 2. Severe paranasal sinus disease. No evidence of osseous erosion. Differential includes inflammatory, allergic as well as infectious process including fungal infection. Clinical correlation is suggested. Electronically Signed   By: Keane Police D.O.   On: 06/23/2021 19:29   DG Chest Portable 1 View  Result Date: 06/23/2021 CLINICAL DATA:  Shortness of breath EXAM: PORTABLE CHEST 1 VIEW COMPARISON:  05/11/2021 FINDINGS: Enlarged cardiomediastinal silhouette with mild central congestion. Patchy right greater than left basilar opacities. No pleural effusion or pneumothorax. IMPRESSION: 1. Cardiomegaly with mild central congestion 2. Streaky basilar opacities which may reflect atelectasis or pneumonia. Electronically Signed   By: Donavan Foil M.D.   On: 06/23/2021 18:23    DISCHARGE EXAMINATION: Vitals:   06/26/21 0800 06/26/21 0818 06/26/21 0900 06/26/21 1000  BP: 130/65  105/82 (!) 140/96  Pulse: 85 83 83 90  Resp: 20     Temp:  98.2 F (36.8 C)    TempSrc:  Oral    SpO2: 95% (!) 86% (!) 89% (!) 85%  Weight:      Height:       General appearance: Awake alert.  In no distress Resp: Clear to auscultation bilaterally.  Normal effort Cardio: S1-S2 is normal regular.  No S3-S4.  No rubs murmurs or bruit GI: Abdomen is soft.  Nontender nondistended.  Bowel sounds are present normal.   No masses organomegaly    DISPOSITION: Home  Discharge Instructions     (HEART FAILURE PATIENTS) Call MD:  Anytime you have any of the  following symptoms: 1) 3 pound weight gain in 24 hours or 5 pounds in 1 week 2) shortness of breath, with or without a dry hacking cough 3) swelling in the hands, feet or stomach 4) if you have to sleep on extra pillows at night in order to breathe.   Complete by: As directed    Call MD for:  difficulty breathing, headache or visual disturbances   Complete by: As directed    Call MD for:  extreme fatigue   Complete by: As directed    Call MD for:  persistant dizziness or light-headedness   Complete by: As directed    Call MD for:  persistant nausea and vomiting   Complete by: As directed    Call MD for:  severe uncontrolled pain   Complete by: As directed    Call MD for:  temperature >100.4   Complete by: As directed    Diet - low sodium heart healthy   Complete by: As directed    Discharge instructions   Complete by: As directed    Please be sure to take your medications as prescribed.  Please be sure to use your ventilator at home every night and also while sleeping during the daytime hours.  Please be sure to follow-up with your pulmonologist in the next few weeks.  Avoid excessive fluid intake.  You were cared for by a hospitalist during your hospital stay. If you have any questions about your discharge medications or the care you received while you were in the hospital after you are discharged, you can call the unit and asked to speak with the hospitalist on call if the hospitalist that took care of you is not available. Once you are discharged, your primary care physician will handle any further medical issues. Please note that NO REFILLS for any discharge medications will be authorized once you are discharged, as it is imperative that you return to your primary care physician (or establish a relationship with a primary care physician if you do not  have one) for your aftercare needs so that they can reassess your need for medications and monitor your lab values. If you do not have a primary care physician, you can call [phone] for a physician referral.   Increase activity slowly   Complete by: As directed           Allergies as of 06/26/2021       Reactions   Ace Inhibitors Anaphylaxis   Black Cherry Fruit Extract Marcelline Mates Extract] Anaphylaxis   Tongue   Grenadine Flavor [flavoring Agent] Anaphylaxis        Medication List     STOP taking these medications    hydrALAZINE 25 MG tablet Commonly known as: APRESOLINE   metolazone 2.5 MG tablet Commonly known as: ZAROXOLYN       TAKE these medications    Advair HFA 115-21 MCG/ACT inhaler Generic drug: fluticasone-salmeterol INHALE 2 PUFFS INTO THE LUNGS 2 (TWO) TIMES DAILY.   albuterol 108 (90 Base) MCG/ACT inhaler Commonly known as: VENTOLIN HFA Inhale 2 puffs into the lungs every 4 (four) hours as needed for wheezing or shortness of breath.   amLODipine 5 MG tablet Commonly known as: NORVASC Take 1 tablet (5 mg total) by mouth daily.   apixaban 5 MG Tabs tablet Commonly known as: ELIQUIS Take 1 tablet (5 mg total) by mouth 2 (two) times daily.   atorvastatin 10 MG tablet Commonly known as: LIPITOR Take 10 mg by mouth daily.  bisoprolol 5 MG tablet Commonly known as: ZEBETA Take 1 tablet (5 mg total) by mouth daily.   EPINEPHrine 0.3 mg/0.3 mL Soaj injection Commonly known as: EPI-PEN Inject 0.3 mg into the muscle as needed for anaphylaxis. What changed: when to take this   famotidine 20 MG tablet Commonly known as: PEPCID Take 1 tablet (20 mg total) by mouth 2 (two) times daily.   multivitamin with minerals Tabs tablet Take 1 tablet by mouth daily.   potassium chloride SA 20 MEQ tablet Commonly known as: KLOR-CON M Take 1 tablet (20 mEq total) by mouth 2 (two) times daily.   spironolactone 25 MG tablet Commonly known as: ALDACTONE Take  1 tablet (25 mg total) by mouth daily.   tamsulosin 0.4 MG Caps capsule Commonly known as: FLOMAX Take 1 capsule (0.4 mg total) by mouth daily after supper.   torsemide 100 MG tablet Commonly known as: DEMADEX Take 1 tablet (100 mg total) by mouth daily.          Follow-up Information     Maryjane Hurter, MD Follow up.   Specialty: Pulmonary Disease Contact information: [place] [address] [phone]                 TOTAL DISCHARGE TIME: 77 minutes  [facility] Pager on [email or web address]  06/27/2021, 12:47 PM

## 2021-06-28 NOTE — Progress Notes (Unsigned)
Cardiology Office Note:    Date:  06/28/2021   ID:  Zachary Hawkins, DOB 02/21/1956, MRN [id number]  PCP:  Pcp, No   [facility] Providers Cardiologist:  Sanda Klein, MD { Click to update primary MD,subspecialty MD or APP then REFRESH:1}    Referring MD: No ref. provider found   No chief complaint on file. ***  History of Present Illness:    Zachary Hawkins is a 66 y.o. male with a hx of chronic HFpEF, HTN, PAF, acute respiratory failure with hypoxia, cardiomyopathy, CKD, angioedema, obesity, and COPD.   He has a history of untreated sleep apnea. He was hospitalized 11/1-11/7/22 and was intubated for acute hypoxic and hypercapnic respiratory failure. Cardiology was not consulted at that time. Echo 04/30/21 revealed LVEF 60-65%, no rwma, normal diastolic parameters, normal RV, mild dilatation of ascending aorta measuring 39 mm.   He returned to ED on 05/11/21 for "COPD exacerbation" when his oxygen saturation was down to about 82% and he had to increase home oxygen to 5L/min. CTA excluded PE and no clear evidence of heart failure or lung infiltrates, no evidence of aortic atherosclerosis and emphysema.   He was last seen by Dr. Sallyanne Kuster on 05/27/21 at which time he was advised to stop furosemide and isosorbide and start torsemide 100 mg. He was advised to return for lab work in 2 weeks and office visit in 1 month.   On 06/23/21 he presented to [facility] ED for shortness of breath. He had inadvertently disconnected his oxygen going to the bathroom and was shaking badly all over. Had difficulty feeling stable once he got back on oxygen and was advised to go to ED by pulmonology. Reported possible LOC, possible seizure activity. He improved with ICU admission on BiPap and diaphoresis and avoided intubation. Discharged 2/17.   Today, he is here  We received request from Dr. Hoyt Koch for patient to have 9 tooth extractions and alveoloplasty.     Past Medical History:  Diagnosis Date    Cardiomyopathy ([place]) 06/2017   EF 40-45%   CHF (congestive heart failure) (HCC)    Chronic renal insufficiency, stage 3 (moderate) (HCC)    COPD (chronic obstructive pulmonary disease) (HCC)    Diastolic dysfunction [id number]   grade 2    Hypertension    Hypertensive urgency    Obesity     No past surgical history on file.  Current Medications: No outpatient medications have been marked as taking for the 06/29/21 encounter (Appointment) with Almyra Deforest, [place].     Allergies:   Ace inhibitors, Black cherry fruit extract Marcelline Mates extract], and Naval architect agent]   Social History   Socioeconomic History   Marital status: Single    Spouse name: Not on file   Number of children: Not on file   Years of education: Not on file   Highest education level: Not on file  Occupational History   Not on file  Tobacco Use   Smoking status: Former    Packs/day: 1.00    Years: 36.00    Pack years: 36.00    Types: Cigarettes    Quit date: 01/08/2021    Years since quitting: 0.4   Smokeless tobacco: Never  Substance and Sexual Activity   Alcohol use: Yes    Alcohol/week: 1.0 standard drink    Types: 1 Shots of liquor per week    Comment: socially   Drug use: Yes    Types: Marijuana    Comment: every other week  Sexual activity: Not on file  Other Topics Concern   Not on file  Social History Narrative   Not on file   Social Determinants of Health   Financial Resource Strain: Not on file  Food Insecurity: Not on file  Transportation Needs: Not on file  Physical Activity: Not on file  Stress: Not on file  Social Connections: Not on file     Family History: The patient's ***family history includes Diabetes in his father and mother.  ROS:   Please see the history of present illness.    *** All other systems reviewed and are negative.  Labs/Other Studies Reviewed:    The following studies were reviewed today:  Echo 12/22  Left Ventricle: Left ventricular  ejection fraction, by estimation, is 60  to 65%. The left ventricle has normal function. The left ventricle has no  regional wall motion abnormalities. Definity contrast agent was given IV  to delineate the left ventricular  endocardial borders. The left ventricular internal cavity size was normal in size. There is borderline left ventricular hypertrophy. Left ventricular diastolic parameters were normal.  Right Ventricle: The right ventricular size is not well visualized. Right  vetricular wall thickness was not well visualized. Right ventricular  systolic function is normal.  Left Atrium: Left atrial size was normal in size.  Right Atrium: Right atrial size was normal in size.  Pericardium: There is no evidence of pericardial effusion.  Mitral Valve: The mitral valve is grossly normal. No evidence of mitral  valve regurgitation. No evidence of mitral valve stenosis.  Tricuspid Valve: The tricuspid valve is grossly normal. Tricuspid valve  regurgitation is not demonstrated. No evidence of tricuspid stenosis.  Aortic Valve: The aortic valve is grossly normal. Aortic valve  regurgitation is not visualized. No aortic stenosis is present.  Pulmonic Valve: The pulmonic valve was not well visualized. Pulmonic valve  regurgitation is not visualized.  Aorta: Aortic dilatation noted. There is mild dilatation of the ascending  aorta, measuring 39 mm.  Venous: The inferior vena cava is normal in size with greater than 50%  respiratory variability, suggesting right atrial pressure of 3 mmHg.  IAS/Shunts: The atrial septum is grossly normal.   Echo 9/22  Left Ventricle: Left ventricular ejection fraction, by estimation, is 60  to 65%. The left ventricle has normal function. The left ventricle has no  regional wall motion abnormalities. Definity contrast agent was given IV  to delineate the left ventricular  endocardial borders. The left ventricular internal cavity size was normal in size. There is  mild left ventricular hypertrophy. Left ventricular diastolic parameters are consistent with Grade I diastolic dysfunction  (impaired relaxation).  Right Ventricle: The right ventricular size is normal. No increase in  right ventricular wall thickness. Right ventricular systolic function is  normal.  Left Atrium: Left atrial size was normal in size.  Right Atrium: Right atrial size was normal in size.  Pericardium: There is no evidence of pericardial effusion.  Mitral Valve: The mitral valve is normal in structure. No evidence of  mitral valve regurgitation. No evidence of mitral valve stenosis.  Tricuspid Valve: The tricuspid valve is normal in structure. Tricuspid  valve regurgitation is not demonstrated. No evidence of tricuspid  stenosis.  Aortic Valve: The aortic valve is normal in structure. Aortic valve  regurgitation is not visualized. No aortic stenosis is present.  Pulmonic Valve: The pulmonic valve was normal in structure. Pulmonic valve  regurgitation is not visualized. No evidence of pulmonic stenosis.  Aorta:  Aortic dilatation noted. There is mild dilatation of the aortic  root, measuring 40 mm. There is borderline dilatation of the ascending  aorta, measuring 39 mm.  Venous: The inferior vena cava is normal in size with greater than 50%  respiratory variability, suggesting right atrial pressure of 3 mmHg.  IAS/Shunts: No atrial level shunt detected by color flow Doppler.    Recent Labs: 04/29/2021: TSH 0.962 06/23/2021: B Natriuretic Peptide 27.3 06/25/2021: ALT 17; Hemoglobin 14.3; Magnesium 2.1; Platelets 193 06/26/2021: BUN 26; Creatinine, Ser 1.60; Potassium 4.3; Sodium 139  Recent Lipid Panel    Component Value Date/Time   CHOL 189 07/11/2020 1103   TRIG 83 03/12/2021 0510   HDL 49 07/11/2020 1103   CHOLHDL 3.9 07/11/2020 1103   LDLCALC 109 (H) 07/11/2020 1103     Risk Assessment/Calculations:   {Does this patient have ATRIAL FIBRILLATION?:[id number]}        Physical Exam:    VS:  There were no vitals taken for this visit.    Wt Readings from Last 3 Encounters:  06/26/21 (!) 344 lb 5.7 oz (156.2 kg)  05/27/21 (!) 344 lb (156 kg)  05/15/21 (!) 337 lb 12.8 oz (153.2 kg)     GEN: *** Well nourished, well developed in no acute distress HEENT: Normal NECK: No JVD; No carotid bruits CARDIAC: ***RRR, no murmurs, rubs, gallops RESPIRATORY:  Clear to auscultation without rales, wheezing or rhonchi  ABDOMEN: Soft, non-tender, non-distended MUSCULOSKELETAL:  No edema; No deformity. *** pedal pulses, ***bilaterally SKIN: Warm and dry NEUROLOGIC:  Alert and oriented x 3 PSYCHIATRIC:  Normal affect   EKG:  EKG is *** ordered today.  The ekg ordered today demonstrates ***  Diagnoses:    No diagnosis found. Assessment and Plan:     ***  {The patient has an active order for outpatient cardiac rehabilitation.   Please indicate if the patient is ready to start. Do NOT delete this.  It will auto delete.  Refresh note, then sign.              Click here to document readiness and see contraindications.  :1}  Cardiac Rehabilitation Eligibility Assessment       {The patient has an active order for outpatient cardiac rehabilitation.   Please indicate if the patient is ready to start. Do NOT delete this.  It will auto delete.  Refresh note, then sign.              Click here to document readiness and see contraindications.  :1}  Cardiac Rehabilitation Eligibility Assessment       {Are you ordering a CV Procedure (e.g. stress test, cath, DCCV, TEE, etc)?   Press F2        :[id number]    Medication Adjustments/Labs and Tests Ordered: Current medicines are reviewed at length with the patient today.  Concerns regarding medicines are outlined above.  No orders of the defined types were placed in this encounter.  No orders of the defined types were placed in this encounter.   There are no Patient Instructions on file for this visit.    Signed, Emmaline Life, NP  06/28/2021 8:12 PM    [place]

## 2021-06-29 ENCOUNTER — Ambulatory Visit: Payer: Medicaid Other | Admitting: Nurse Practitioner

## 2021-06-30 ENCOUNTER — Ambulatory Visit: Payer: Medicaid Other | Admitting: Gastroenterology

## 2021-06-30 NOTE — Telephone Encounter (Signed)
Pt has been scheduled to see Hubbard Hartshorn, NP, 07/07/21, clearance will be addressed at that time.  Will route back to the requesting surgeon's office to make them aware.

## 2021-06-30 NOTE — Telephone Encounter (Signed)
Pt has been discharged and has a follow-up scheduled with the Heart Failure team, please advise.

## 2021-06-30 NOTE — Telephone Encounter (Signed)
He will be seeing the HF impact TOC clinic for a post-hospital visit but may not necessarily be following with them long term. Recommend arranging general cardiology OV as well within the next 2 weeks for f/u and to address surgical clearance. OK to see APP. He has seen Davonna Belling, and [place] in the past so would be helpful to maintain continuity. Thank you!

## 2021-07-01 ENCOUNTER — Encounter: Payer: Medicaid Other | Admitting: Gastroenterology

## 2021-07-06 ENCOUNTER — Encounter (HOSPITAL_COMMUNITY): Payer: Medicaid Other

## 2021-07-06 NOTE — Progress Notes (Unsigned)
Office Visit    Patient Name: Zachary Hawkins Date of Encounter: 07/06/2021  PCP:  Kathyrn Lass   [place]  Cardiologist:  Sanda Klein, MD  Advanced Practice Provider:  No care team member to display Electrophysiologist:  None      Chief Complaint    Zachary Hawkins is a 66 y.o. male with a hx of CKDIIIa, combined systolic and diastolic heart failure, HTN, atrial fibrillation, OSA, ACE angioedema 10/2017, COPD, chronic respiratory failure on home O2 presents today for hospital follow up and preoperative clearance.  Past Medical History    Past Medical History:  Diagnosis Date   Asthma    BPH (benign prostatic hyperplasia)    Cardiomyopathy ([place]) 06/2017   EF 40-45%   CHF (congestive heart failure) (HCC)    Chronic renal insufficiency, stage 3 (moderate) (HCC)    COPD (chronic obstructive pulmonary disease) (HCC)    Diastolic dysfunction [id number]   grade 2    Hyperlipidemia    Hypertension    Hypertensive urgency    Obesity    Obesity    OSA (obstructive sleep apnea)    Paroxysmal atrial fibrillation ([place]) 2022   on Eliquis   No past surgical history on file.  Allergies  Allergies  Allergen Reactions   Ace Inhibitors Anaphylaxis   Black Cherry Fruit Extract Marcelline Mates Extract] Anaphylaxis    Tongue   Grenadine Flavor [Flavoring Agent] Anaphylaxis    History of Present Illness    Zachary Hawkins is a 66 y.o. male with a hx of CKDIIIa, combined systolic and diastolic heart failure, HTN, atrial fibrillation, OSA, ACE angioedema 10/2017, COPD, chronic respiratory failure on home O2 last seen***  Echo 9/2022LVEF 60-65%, gr1DD.  He was admitted 03/10/21-03/16/21 for acute on chronic respiratory failure with pulmonary edema, hx of OSA not treated and suspected COPD with acute exacerbation. He required intubation on 03/11/21 and extubated on 03/12/21. He was discharged on 6L O2 and followed up with pulmonology. Weight at discharge 322 lbs.   He saw  Dr. Sallyanne Kuster 04/24/21 and Metolazone added in the morning prior to Furosemide twice weekly. He unfortunately did not pick up the medication. He presented to the hospital 04/29/21 with acute on chronic heart failure as well as acute on chronic respiratory failure. He had 20+ lb weight gain (weighing 245 lbs), LE edema, scrotal swelling. He was diuresed 9L. He was 324.7 lbs at discharge.  Seen 05/15/21 with weight up 12.3 lbs over 2 weeks, exertional dyspnea, not using Trilogy, unclear whether taking Metoloazone. Metolazone resumed twice per week. Seen by Dr. Sallyanne Kuster 05/27/21 with weight up an additional 7 pounds, markedly hypervolemic. A friend named [name] was staying with him and planning to help with diet as consuming high sodium foods. [facility] service reported often not taking afternoon Lasix. Transitioned to Torsemide 100mg  QD and Metolazone twice per week. Isosorbide discontinued due to hypotension. Amlodipine, Bisoprolol, Spironolactone were continued.   Admitted 06/23/21-06/26/21 with acute on chronic hypercarbic and hypoxic respiratory failure, acute on chronic diastolic heart failure. Cardiology not consulted during admission.  He was noted to be noncompliant with his noninvasive ventilator. Weight on discharge 343.64. Metolazone discontinued on discharge, unclear why.  Preop clearance received for removal of 9 teeth with alveoloplasty. Per office protocol may hold Eliquis for 1 day prior to procedure and does not require pre-op antibiotics.   ***  EKGs/Labs/Other Studies Reviewed:   The following studies were reviewed today:  ECHO 04/30/21:  1. Left ventricular  ejection fraction, by estimation, is 60 to 65%. The  left ventricle has normal function. The left ventricle has no regional  wall motion abnormalities. Left ventricular diastolic parameters were  normal.   2. Right ventricular systolic function is normal. The right ventricular  size is not well visualized.   3. The mitral valve is grossly  normal. No evidence of mitral valve  regurgitation. No evidence of mitral stenosis.   4. The aortic valve is grossly normal. Aortic valve regurgitation is not  visualized. No aortic stenosis is present.   5. Aortic dilatation noted. There is mild dilatation of the ascending  aorta, measuring 39 mm.   6. The inferior vena cava is normal in size with greater than 50%  respiratory variability, suggesting right atrial pressure of 3 mmHg.   EKG:  No EKG today.***  Recent Labs: 04/29/2021: TSH 0.962 06/23/2021: B Natriuretic Peptide 27.3 06/25/2021: ALT 17; Hemoglobin 14.3; Magnesium 2.1; Platelets 193 06/26/2021: BUN 26; Creatinine, Ser 1.60; Potassium 4.3; Sodium 139  Recent Lipid Panel    Component Value Date/Time   CHOL 189 07/11/2020 1103   TRIG 83 03/12/2021 0510   HDL 49 07/11/2020 1103   CHOLHDL 3.9 07/11/2020 1103   LDLCALC 109 (H) 07/11/2020 1103    Risk Assessment/Calculations:   CHA2DS2-VASc Score = 2   This indicates a 2.2% annual risk of stroke. The patient's score is based upon: CHF History: 1 HTN History: 1 Diabetes History: 0 Stroke History: 0 Vascular Disease History: 0 Age Score: 0 Gender Score: 0   Home Medications   No outpatient medications have been marked as taking for the 07/07/21 encounter (Appointment) with Loel Dubonnet, NP.     Review of Systems      Review of Systems  Constitutional: Positive for malaise/fatigue. Negative for chills and fever.  Cardiovascular:  Positive for dyspnea on exertion and leg swelling. Negative for chest pain, irregular heartbeat, near-syncope, orthopnea, palpitations and syncope.  Respiratory:  Positive for shortness of breath. Negative for cough and wheezing.   Gastrointestinal:  Negative for melena, nausea and vomiting.  Genitourinary:  Negative for hematuria.  Neurological:  Negative for dizziness, light-headedness and weakness.   *** All other systems reviewed and are otherwise negative except as noted  above.  Physical Exam    VS:  There were no vitals taken for this visit. , BMI There is no height or weight on file to calculate BMI.  Wt Readings from Last 3 Encounters:  06/26/21 (!) 344 lb 5.7 oz (156.2 kg)  05/27/21 (!) 344 lb (156 kg)  05/15/21 (!) 337 lb 12.8 oz (153.2 kg)    *** GEN: Well nourished, overweight, well developed, in no acute distress. HEENT: normal. Neck: Supple, no JVD, carotid bruits, or masses. Cardiac: RRR, no murmurs, rubs, or gallops. No clubbing, cyanosis. Bilateral LE with 2+  pitting edema. .  Radials/PT 2+ and equal bilaterally.  Respiratory:  Respirations regular and unlabored, diminished bases bilaterally likely due to body habitus GI: Soft, nontender, nondistended. MS: No deformity or atrophy. Skin: Warm and dry, no rash. Neuro:  Strength and sensation are intact. Psych: Normal affect.  Assessment & Plan    Combined systolic and diastolic heart failure - [id number] LVEF 40-45% improved to 60-65% 2022.  Recent admission with acute on chronic diastolic heart failure.  He is presently up 12 pounds from discharge 2 weeks prior. Dry weight approximately 322 lbs. Anticipate noncompliance with heart failure lifestyle changes as well as recent addition of prednisone are  contributory. He is not taking Metolazone consistently nor 30 minutes prior to Furosemide. He will take Metolazone tomorrow (Saturday) 30 minutes prior to Furosemide. Thereafter he will take on Mondays and Thursdays. Updated instructions provided and he will take his pill pack to his pharmacy to be updated. Additional GDMT includes Isordil, Bisoprolol, Spironolactone. Previous angioedema with ACE. Low salt diet, fluid restriction <2 liters encouraged. Will refer to [facility] RN for heart failure teaching and medication management. ***  Deconditioning - Will refer to [facility] PT as he is markedly deconditioned after recent admission. ***  Chronic hypoxic respiratory failure / COPD -seen in ED for exacerbation  05/11/2021.  Presently on prednisone as well as doxycycline.  Upcoming follow-up with pulmonology 06/23/2021***  CKDIIIa - Careful titration of diuretic and antihypertensive.  ***  Hypokalemia - continue potassium 20 mEq twice daily.***  Bilateral hydroceles - Follow with urology.***  PAF / Chronic anticoagulation - SR today by auscultation. Continue Bisoprolol 5mg  daily.  Continue Eliquis 5 mg twice daily.  Does not meet dose reduction criteria.  Denies bleeding complications. CHA2DS2-VASc Score = 2 [CHF History: 1, HTN History: 1, Diabetes History: 0, Stroke History: 0, Vascular Disease History: 0, Age Score: 0, Gender Score: 0].  Therefore, the patient's annual risk of stroke is 2.2 %.    Eilquis refill provided.***  HTN - BP elevated in setting of volume overload. Increased diuresis, as above. Continue current antihypertensive regimen. Recommend home monitoring of BP. BP goal <130/80. ***  OSA - Reports Trilogy device at home not working. Encouraged him to call his DME company to ensure resolution. Discussed the importance of using his Trilogy ventilator at night. ***  COPD - ED visit 05/11/20 with exacerbation. Encouraged to complete course of Doxycycline and Prednisone. Continue to follow with pulmonology.***   HLD - Continue Atorvastatin 10mg  QD. ***  {The patient has an active order for outpatient cardiac rehabilitation.   Please indicate if the patient is ready to start. Do NOT delete this.  It will auto delete.  Refresh note, then sign.              Click here to document readiness and see contraindications.  :1}  Cardiac Rehabilitation Eligibility Assessment    However he has additionally been referred for home health and may complete that prior to cardiac rehab if necessary from an insurance standpoint.   Disposition: Follow up in 2 week(s) with Sanda Klein, MD or APP.  Signed, Loel Dubonnet, NP 07/06/2021, 3:22 PM [facility]

## 2021-07-07 ENCOUNTER — Encounter (HOSPITAL_COMMUNITY): Payer: Medicaid Other

## 2021-07-07 ENCOUNTER — Ambulatory Visit (HOSPITAL_BASED_OUTPATIENT_CLINIC_OR_DEPARTMENT_OTHER): Payer: Medicaid Other | Admitting: Family

## 2021-07-07 NOTE — Telephone Encounter (Signed)
Patient was a no-show for his preoperative clinic visit.   Due for cardiology OV for follow up post hospital as well as preop clearance. Will route to NL scheduling team for assistance in scheduling with Dr. Sallyanne Kuster or APP.   Will route to surgical team so they are aware.   Loel Dubonnet, NP

## 2021-07-09 ENCOUNTER — Other Ambulatory Visit: Payer: Self-pay

## 2021-07-09 ENCOUNTER — Encounter: Payer: Self-pay | Admitting: Student

## 2021-07-09 ENCOUNTER — Ambulatory Visit: Payer: Medicaid Other | Admitting: Student

## 2021-07-09 VITALS — BP 132/80 | HR 97 | Temp 98.4°F | Ht 73.0 in | Wt 345.0 lb

## 2021-07-09 DIAGNOSIS — J310 Chronic rhinitis: Secondary | ICD-10-CM | POA: Diagnosis not present

## 2021-07-09 DIAGNOSIS — J9611 Chronic respiratory failure with hypoxia: Secondary | ICD-10-CM | POA: Diagnosis not present

## 2021-07-09 DIAGNOSIS — J329 Chronic sinusitis, unspecified: Secondary | ICD-10-CM | POA: Diagnosis not present

## 2021-07-09 MED ORDER — FLUTICASONE PROPIONATE 50 MCG/ACT NA SUSP
1.0000 | Freq: Every day | NASAL | 2 refills | Status: DC
Start: 2021-07-09 — End: 2022-01-18

## 2021-07-09 MED ORDER — AYR SALINE NASAL NA GEL: 1.0000 "application " | Freq: Every day | NASAL | 11 refills | Status: DC

## 2021-07-09 NOTE — Progress Notes (Signed)
Synopsis: Referred for decreased O2 saturation, restrictive lung disease, dyspnea by No ref. provider found  Subjective:   PATIENT ID: Zachary Hawkins GENDER: male DOB: 1956-01-21, MRN: [id number]  Chief Complaint  Patient presents with   Hospitalization Follow-up    Breathing has improved. He c/o nasal congestion.    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 He was referred to allergy/immunology.   He has some DOE. Taking advair one puff BID. No cough especially since stopping smoking.   He never had any itching during either of the above episodes. Tryptase was negative.   He is finishing course of steroids.   Snores. Some PND. Some daytime sleepiness.   He had an episode yesterday where home oxygen cut off and it worried him.   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   Interval HPI: Last seen by me 05/07/21, went to ED 05/11/21, given prednisone/doxy. To cardiology 05/15/21 where he was 12 lb over dry weight (322).   Seen in follow up today after hospitalization for volume overload.  He is using his trilogy every night but maximum 3h. On and off of it overnight, mostly due to nose dryness and crusting. He says RT that came to house did not help with humidification.   Otherwise pertinent review of systems is negative.     Past Medical History:  Diagnosis Date   Asthma    BPH (benign prostatic hyperplasia)    Cardiomyopathy ([place]) 06/2017   EF 40-45%   CHF (congestive heart failure) (HCC)    Chronic renal insufficiency, stage 3 (moderate)  (HCC)    COPD (chronic obstructive pulmonary disease) (HCC)    Diastolic dysfunction [id number]   grade 2    Hyperlipidemia    Hypertension    Hypertensive urgency    Obesity    Obesity    OSA (obstructive sleep apnea)    Paroxysmal atrial fibrillation ([place]) 2022   on Eliquis     Family History  Problem Relation Age of Onset   Diabetes Mother    Diabetes Father      No past surgical history on file.  Social History   Socioeconomic History   Marital status: Single    Spouse name: Not on file   Number of children: Not on file   Years of education: Not on file   Highest education level: Not on file  Occupational History   Not on file  Tobacco Use   Smoking status: Former    Packs/day: 1.00    Years: 36.00    Pack years: 36.00    Types: Cigarettes    Quit date: 01/08/2021    Years since quitting: 0.4   Smokeless tobacco: Never  Substance and Sexual Activity   Alcohol use: Yes    Alcohol/week: 1.0 standard drink    Types: 1 Shots of liquor per week    Comment: socially   Drug use: Yes    Types: Marijuana    Comment: every other week   Sexual activity: Not on file  Other Topics Concern   Not on  file  Social History Narrative   Not on file   Social Determinants of Health   Financial Resource Strain: Not on file  Food Insecurity: Not on file  Transportation Needs: Not on file  Physical Activity: Not on file  Stress: Not on file  Social Connections: Not on file  Intimate Partner Violence: Not on file     Allergies  Allergen Reactions   Ace Inhibitors Anaphylaxis   Black Cherry Fruit Extract Marcelline Mates Extract] Anaphylaxis    Tongue   Grenadine Flavor [Flavoring Agent] Anaphylaxis     Outpatient Medications Prior to Visit  Medication Sig Dispense Refill   ADVAIR HFA 115-21 MCG/ACT inhaler INHALE 2 PUFFS INTO THE LUNGS 2 (TWO) TIMES DAILY. 36 g 12   albuterol (VENTOLIN HFA) 108 (90 Base) MCG/ACT inhaler Inhale 2 puffs into the lungs every 4 (four) hours as  needed for wheezing or shortness of breath. 18 g 0   amLODipine (NORVASC) 5 MG tablet Take 1 tablet (5 mg total) by mouth daily. 30 tablet 0   apixaban (ELIQUIS) 5 MG TABS tablet Take 1 tablet (5 mg total) by mouth 2 (two) times daily. 60 tablet 5   atorvastatin (LIPITOR) 10 MG tablet Take 10 mg by mouth daily.     bisoprolol (ZEBETA) 5 MG tablet Take 1 tablet (5 mg total) by mouth daily. 90 tablet 3   EPINEPHrine 0.3 mg/0.3 mL IJ SOAJ injection Inject 0.3 mg into the muscle as needed for anaphylaxis. (Patient taking differently: Inject 0.3 mg into the muscle once as needed for anaphylaxis.) 1 each 1   famotidine (PEPCID) 20 MG tablet Take 1 tablet (20 mg total) by mouth 2 (two) times daily. 28 tablet 0   Multiple Vitamin (MULTIVITAMIN WITH MINERALS) TABS tablet Take 1 tablet by mouth daily.     potassium chloride SA (KLOR-CON M) 20 MEQ tablet Take 1 tablet (20 mEq total) by mouth 2 (two) times daily. 180 tablet 1   spironolactone (ALDACTONE) 25 MG tablet Take 1 tablet (25 mg total) by mouth daily. 90 tablet 3   tamsulosin (FLOMAX) 0.4 MG CAPS capsule Take 1 capsule (0.4 mg total) by mouth daily after supper. 30 capsule 2   torsemide (DEMADEX) 100 MG tablet Take 1 tablet (100 mg total) by mouth daily. 30 tablet 11   No facility-administered medications prior to visit.       Objective:   Physical Exam:  General appearance: 66 y.o., male, NAD, conversant  Eyes: anicteric sclerae; PERRL, tracking appropriately HENT: NCAT; MMM Neck: Trachea midline; no lymphadenopathy, no JVD Lungs: distant, no crackles, no wheeze, with normal respiratory effort on 2L O2 CV: RRR, no murmur  Abdomen: Soft, non-tender; non-distended, BS present  Extremities: 1+ nonpitting peripheral edema, warm Skin: Normal turgor and texture; no rash Psych: Appropriate affect Neuro: Alert and oriented to person and place, no focal deficit     Vitals:   07/09/21 1342  BP: 132/80  Pulse: 97  Temp: 98.4 F (36.9 C)   TempSrc: Oral  SpO2: 92%  Weight: (!) 345 lb (156.5 kg)  Height: 6\' 1"  (1.854 m)     92% on 3L BMI Readings from Last 3 Encounters:  07/09/21 45.52 kg/m  06/26/21 45.43 kg/m  05/27/21 45.39 kg/m   Wt Readings from Last 3 Encounters:  07/09/21 (!) 345 lb (156.5 kg)  06/26/21 (!) 344 lb 5.7 oz (156.2 kg)  05/27/21 (!) 344 lb (156 kg)     CBC    Component Value Date/Time  WBC 8.3 06/25/2021 0145   RBC 4.48 06/25/2021 0145   HGB 14.3 06/25/2021 1230   HGB 16.7 07/11/2020 1103   HCT 42.0 06/25/2021 1230   HCT 50.3 07/11/2020 1103   PLT 193 06/25/2021 0145   PLT 232 07/11/2020 1103   MCV 97.5 06/25/2021 0145   MCV 88 07/11/2020 1103   MCH 29.0 06/25/2021 0145   MCHC 29.7 (L) 06/25/2021 0145   RDW 14.5 06/25/2021 0145   RDW 13.6 07/11/2020 1103   LYMPHSABS 2.0 06/25/2021 0145   MONOABS 0.9 06/25/2021 0145   EOSABS 0.6 (H) 06/25/2021 0145   BASOSABS 0.1 06/25/2021 0145   C4 31 IgE 394 C1 inh above normal  Tryptase WNL  Chest Imaging: CXR 01/13/21 reviewed by me and unremarkable  CT A/P 04/29/21 lung bases reviewed by with RLL scar and lower lobe ?cysts   CTA Chest 05/11/21 reviewed by me with bronchial wall thickening, stable scar  Pulmonary Functions Testing Results: No flowsheet data found.    Echocardiogram:   TTE 01/08/21 EF 60-65%, G1DD  TTE 04/30/21 essentially normal     Assessment & Plan:   # Dyspnea on exertion: # OSA # HFpEF # Chronic hypoxic hypercapnic respiratory failure May be multifactorial from deconditioning, diastolic dysfunction (but looks well compensated today), undertreated sleep-disordered breathing, possible asthma/copd.   # CRS   # Angioedema: possible that it is still related to ACEi even years from taking it. Not sure what to make of the elevated C1inh level.  # Smoking: Congratulated him on cessation. Does not qualify for lung cancer screening with <20 py smoking.  Plan: - Focused this visit on encouraging adherence  to daily torsemide 100 mg and measuring weights daily, he'll notify us if weight gain > 3lb in a day or 5lb in a week for consideration of additional torsemide dosing vs addition of metolazone - We will place order for humidification of his trilogy - DME supplier is [employer] - start neti pot rinse with salt packet in distilled or boiled and then cooled water - then flonase 1 spray each nostril - can use ayr nasal 1 spray each nostril as needed during the day to moisten your nose - stay on advair 2 puffs twice daily and rinse your mouth after use - weight loss encouraged - follow up with allergy for angioedema   RTC 4 weeks per pt request, if stable check PFTs   Maryjane Hurter, MD [facility] 07/09/2021 1:53 PM

## 2021-07-09 NOTE — Patient Instructions (Addendum)
-   neti pot rinse with salt packet in distilled or boiled and then cooled water ?- then flonase 1 spray each nostril ?- can use ayr nasal 1 spray each nostril as needed during the day to moisten your nose ?- torsemide 100 mg daily. Weigh yourself daily. If you have gained more than 3 lb in a day or 5 lb in a week give [place] a call or send a message, you may need to take an extra torsemide tablet in the afternoon for a few days. ?- stay on advair 2 puffs twice daily and rinse your mouth after use ? ?

## 2021-07-15 ENCOUNTER — Encounter (HOSPITAL_COMMUNITY): Payer: Self-pay

## 2021-07-15 ENCOUNTER — Ambulatory Visit (HOSPITAL_COMMUNITY)
Admission: RE | Admit: 2021-07-15 | Discharge: 2021-07-15 | Disposition: A | Discharge status: Home / Self Care | Payer: Medicaid Other | Source: Ambulatory Visit | Attending: Adult Health | Admitting: Adult Health

## 2021-07-15 ENCOUNTER — Other Ambulatory Visit: Payer: Self-pay

## 2021-07-15 VITALS — BP 138/98 | HR 88 | Ht 73.0 in | Wt 348.0 lb

## 2021-07-15 DIAGNOSIS — Z9981 Dependence on supplemental oxygen: Secondary | ICD-10-CM | POA: Diagnosis not present

## 2021-07-15 DIAGNOSIS — Z9989 Dependence on other enabling machines and devices: Secondary | ICD-10-CM | POA: Insufficient documentation

## 2021-07-15 DIAGNOSIS — I5032 Chronic diastolic (congestive) heart failure: Secondary | ICD-10-CM

## 2021-07-15 DIAGNOSIS — Z7901 Long term (current) use of anticoagulants: Secondary | ICD-10-CM | POA: Diagnosis not present

## 2021-07-15 DIAGNOSIS — J441 Chronic obstructive pulmonary disease with (acute) exacerbation: Secondary | ICD-10-CM | POA: Insufficient documentation

## 2021-07-15 DIAGNOSIS — Z888 Allergy status to other drugs, medicaments and biological substances status: Secondary | ICD-10-CM | POA: Diagnosis not present

## 2021-07-15 DIAGNOSIS — N1831 Chronic kidney disease, stage 3a: Secondary | ICD-10-CM | POA: Diagnosis not present

## 2021-07-15 DIAGNOSIS — I130 Hypertensive heart and chronic kidney disease with heart failure and stage 1 through stage 4 chronic kidney disease, or unspecified chronic kidney disease: Secondary | ICD-10-CM | POA: Diagnosis not present

## 2021-07-15 DIAGNOSIS — Z7951 Long term (current) use of inhaled steroids: Secondary | ICD-10-CM | POA: Diagnosis not present

## 2021-07-15 DIAGNOSIS — F1290 Cannabis use, unspecified, uncomplicated: Secondary | ICD-10-CM | POA: Insufficient documentation

## 2021-07-15 DIAGNOSIS — I480 Paroxysmal atrial fibrillation: Secondary | ICD-10-CM | POA: Diagnosis not present

## 2021-07-15 DIAGNOSIS — Z6841 Body Mass Index (BMI) 40.0 and over, adult: Secondary | ICD-10-CM | POA: Diagnosis not present

## 2021-07-15 DIAGNOSIS — G4733 Obstructive sleep apnea (adult) (pediatric): Secondary | ICD-10-CM

## 2021-07-15 DIAGNOSIS — J9612 Chronic respiratory failure with hypercapnia: Secondary | ICD-10-CM

## 2021-07-15 DIAGNOSIS — Z79899 Other long term (current) drug therapy: Secondary | ICD-10-CM | POA: Diagnosis not present

## 2021-07-15 DIAGNOSIS — Z91199 Patient's noncompliance with other medical treatment and regimen due to unspecified reason: Secondary | ICD-10-CM | POA: Insufficient documentation

## 2021-07-15 DIAGNOSIS — M2550 Pain in unspecified joint: Secondary | ICD-10-CM | POA: Insufficient documentation

## 2021-07-15 DIAGNOSIS — J9611 Chronic respiratory failure with hypoxia: Secondary | ICD-10-CM | POA: Diagnosis not present

## 2021-07-15 DIAGNOSIS — F1090 Alcohol use, unspecified, uncomplicated: Secondary | ICD-10-CM | POA: Diagnosis not present

## 2021-07-15 DIAGNOSIS — F17210 Nicotine dependence, cigarettes, uncomplicated: Secondary | ICD-10-CM | POA: Insufficient documentation

## 2021-07-15 DIAGNOSIS — E669 Obesity, unspecified: Secondary | ICD-10-CM | POA: Insufficient documentation

## 2021-07-15 LAB — BASIC METABOLIC PANEL
Anion gap: 8 (ref 5–15)
BUN: 17 mg/dL (ref 8–23)
CO2: 39 mmol/L — ABNORMAL HIGH (ref 22–32)
Calcium: 9.3 mg/dL (ref 8.9–10.3)
Chloride: 92 mmol/L — ABNORMAL LOW (ref 98–111)
Creatinine, Ser: 1.41 mg/dL — ABNORMAL HIGH (ref 0.61–1.24)
GFR, Estimated: 56 mL/min — ABNORMAL LOW (ref >60)
Glucose, Bld: 106 mg/dL — ABNORMAL HIGH (ref 70–99)
Potassium: 4.7 mmol/L (ref 3.5–5.1)
Sodium: 139 mmol/L (ref 135–145)

## 2021-07-15 MED ORDER — TORSEMIDE 100 MG PO TABS
ORAL_TABLET | ORAL | 11 refills | Status: DC
Start: 2021-07-15 — End: 2021-08-05

## 2021-07-15 NOTE — Progress Notes (Signed)
?Heart and Vascular Care Navigation ? ?07/15/2021 ? ?Zachary Hawkins ?May 21, 1955 ?[id number] ? ?Reason for Referral: Evaluate for Community Paramedicine program ?  ?Engaged with patient face to face for initial visit for Heart and Vascular Care Coordination. ?                                                                                                  ?Assessment: CSW met with pt to evaluate if appropriate for [employer].  Per patient he is already seeing a paramedic through his PCP office, [facility].  States they come out 2x a week and assist with education, vital check, weight checks, etc.  Pt states he gets pill packs through [facility] so has no problems organizing his medications. ? ?  Pt also reported he works with a Tourist information centre manager, Teacher, music- number listed below.  CSW able to speak with Crystal and confirm that pt can have paramedicine services indefinitely.  Crystal follows pt 30 days post hospital stay but due to pt multiple recent admissions she has been working with him consistently.   CSW discussed with provider and we will hold off on Paramedicine enrollment as services would be repetitive.      ? ?CSW completed SDOH screen with pt.  He states he gets $780/month in SSDI- encouraged him to reach out to discuss why he is not getting more.  States this is hard to keep up with housing costs with limited income.  Pt was staying in a hotel up until 2 months ago when his sister died and left her house to the pts children.  The children have since moved the pt in and have been helping him with household expenses.  States that mortgage is between $800-900/month- inquired about assistance with this.  CSW explained we could evaluate for possible help but that because his income is so low it seemed as if his living expenses were above his means and that paying one month would not fix the problem- encouraged him to consider low income housing so he could afford cost of living if children would  not be permanently supporting him.  Pt states that he had an opportunity to go into a high rise that [employer] helped him with but he declined it.  CSW asked pt RNCM Crystal to get a message to [facility] case worker Anderson Malta to reach out so we could discuss her involvement and what housing resources they have discussed. ? ?No other SDOH needs reported at this time.                             ? ?HRT/VAS Care Coordination   ? ? Outpatient Care Team Community Paramedicine; PCP; RN Care Manager  ? PCP Name: Cydney [phone]  ? Community Paramedic Name: provided through pt PCP office on [facility]- sees 2x a week as of 07/15/21  ? RN Care Manager Name: Donella Stade through [facility]- [phone]  ? Living arrangements for the past 2 months Single Family Home  ? Lives with: Self  ? Patient Current  Insurance Coverage Medicaid  ? Patient Has Concern With Paying Medical Bills No  ? Does Patient Have Prescription Coverage? Yes  ? Home Assistive Devices/Equipment None  ? DME Agency NA  ? Current home services DME  Patient has oxygen and NIV with [employer], Cane, shower chair, rolling walker.  ? ?  ? ? ?Social History:                                                                             ?SDOH Screenings  ? ?Alcohol Screen: Not on file  ?Depression (PHQ2-9): Not on file  ?Financial Resource Strain: High Risk  ? Difficulty of Paying Living Expenses: Very hard  ?Food Insecurity: No Food Insecurity  ? Worried About Charity fundraiser in the Last Year: Never true  ? Ran Out of Food in the Last Year: Never true  ?Housing: Low Risk   ? Last Housing Risk Score: 0  ?Physical Activity: Not on file  ?Social Connections: Not on file  ?Stress: Not on file  ?Tobacco Use: Medium Risk  ? Smoking Tobacco Use: Former  ? Smokeless Tobacco Use: Never  ? Passive Exposure: Not on file  ?Transportation Needs: No Transportation Needs  ? Lack of Transportation (Medical): No  ? Lack of Transportation (Non-Medical): No  ? ? ?SDOH  Interventions: ?Financial Resources:    ?Social Security for Disability application assistance reports he only get $781/month in benefits- unsure why he would not be getting at least the SSI amount of $934- encouraged pt to call SSA to discuss  ?Food Insecurity:  Food Insecurity Interventions: Intervention Not Indicated gets food stamps  ?Housing Insecurity:  Housing Interventions: Intervention Not Indicated some concerns about paying for mortgage- detailed above  ?Transportation:   Transportation Interventions: Intervention Not Indicated owns cars and has adult children who can drive him  ? ? ? ?Follow-up plan:   ? ?CSW will await call from pt [place] worker to discuss any opportunities for collaboration- will assist as needed ? ?Jorge Ny, LCSW ?Clinical Social Worker ?[facility] ?Desk#: [phone] ?Cell#: [phone] ? ? ? ?

## 2021-07-15 NOTE — Progress Notes (Signed)
HEART & VASCULAR TRANSITION OF CARE CONSULT NOTE     Referring Physician:Dr Rito Ehrlich Primary Care:[facility]  Primary Cardiologist:Dr Croitoru Pulmonary: Dr Thora Lance  HPI: Referred to clinic by Dr Rito Ehrlich for heart failure consultation.   [name] is a 66 year old with a h/o HFpEF, OSA, CPAP, smoker, CKD Stage III, HTN, obesity,and PAF. Has had multiple ED/hospital visit over the last year.  Ongoing compliance issues.   Admitted 03/2022 with A/C hypoxic/hypercarbic respiratory failure and volume overload. Treated with antibiotics steroids, nebs and diuretics.   Admitted 04/29/22 with increased shortness of breath in the setting of HF exacerbation. Diuresed with IV lasix and transitioned to lasix 80 mg twice a day  Evaluated in the ED 05/2021 with COPD exacerbation. Treated with nebulizers and steroids.   Admitted 06/23/21 with A/C respiratory failure and heart exacerbation. Placed on Bipap with improvement. Diuresed with IV lasix and transitioned to torsemide 100 mg daily. D/C weigth 344 pounds. Discharged 06/26/21 .  Overall feeling fair. ne. Says he wants help with his diet.  SOB with exertion. Denies PND/Orthopnea. Using Bipap. He is using an Engineer, petroleum in the grocery store. Appetite ok. Today he ate 3 tangerines and had 2 [place] ice. No fever or chills. Weight at home 340-347 pounds. Followed by  [facility] 3 days a week. Taking all medications. Lives alone. Able to drive to appointments.    Cardiac Testing  Echo 04/2021 Ef 60-65% RV normal  Review of Systems: [y] = yes, [ ]  = no   General: Weight gain [ Y]; Weight loss [ ] ; Anorexia [ ] ; Fatigue [ ] ; Fever [ ] ; Chills [ ] ; Weakness [ Y]  Cardiac: Chest pain/pressure [ ] ; Resting SOB [ ] ; Exertional SOB [ Y]; Orthopnea [ Y]; Pedal Edema [Y ]; Palpitations [ ] ; Syncope [ ] ; Presyncope [ ] ; Paroxysmal nocturnal dyspnea[ ]   Pulmonary: Cough [ ] ; Wheezing[ ] ; Hemoptysis[ ] ; Sputum [ ] ; Snoring [ ]   GI: Vomiting[ ] ; Dysphagia[ ] ;  Melena[ ] ; Hematochezia [ ] ; Heartburn[ ] ; Abdominal pain [ ] ; Constipation [ ] ; Diarrhea [ ] ; BRBPR [ ]   GU: Hematuria[ ] ; Dysuria [ ] ; Nocturia[ ]   Vascular: Pain in legs with walking [ ] ; Pain in feet with lying flat [ ] ; Non-healing sores [ ] ; Stroke [ ] ; TIA [ ] ; Slurred speech [ ] ;  Neuro: Headaches[ ] ; Vertigo[ ] ; Seizures[ ] ; Paresthesias[ ] ;Blurred vision [ ] ; Diplopia [ ] ; Vision changes [ ]   Ortho/Skin: Arthritis [ ] ; Joint pain [ Y]; Muscle pain [ ] ; Joint swelling [ ] ; Back Pain [ Y]; Rash [ ]   Psych: Depression[ ] ; Anxiety[ ]   Heme: Bleeding problems [ ] ; Clotting disorders [ ] ; Anemia [ ]   Endocrine: Diabetes [ ] ; Thyroid dysfunction[ ]    Past Medical History:  Diagnosis Date   Asthma    BPH (benign prostatic hyperplasia)    Cardiomyopathy (HCC) 06/2017   EF 40-45%   CHF (congestive heart failure) (HCC)    Chronic renal insufficiency, stage 3 (moderate) (HCC)    COPD (chronic obstructive pulmonary disease) (HCC)    Diastolic dysfunction 06/2017   grade 2    Hyperlipidemia    Hypertension    Hypertensive urgency    Obesity    Obesity    OSA (obstructive sleep apnea)    Paroxysmal atrial fibrillation (HCC) 2022   on Eliquis    Current Outpatient Medications  Medication Sig Dispense Refill   ADVAIR HFA 115-21 MCG/ACT inhaler INHALE 2 PUFFS  INTO THE LUNGS 2 (TWO) TIMES DAILY. 36 g 12   albuterol (VENTOLIN HFA) 108 (90 Base) MCG/ACT inhaler Inhale 2 puffs into the lungs every 4 (four) hours as needed for wheezing or shortness of breath. 18 g 0   amLODipine (NORVASC) 5 MG tablet Take 1 tablet (5 mg total) by mouth daily. 30 tablet 0   apixaban (ELIQUIS) 5 MG TABS tablet Take 1 tablet (5 mg total) by mouth 2 (two) times daily. 60 tablet 5   atorvastatin (LIPITOR) 10 MG tablet Take 10 mg by mouth daily.     bisoprolol (ZEBETA) 5 MG tablet Take 1 tablet (5 mg total) by mouth daily. 90 tablet 3   EPINEPHrine 0.3 mg/0.3 mL IJ SOAJ injection Inject 0.3 mg into the muscle as  needed for anaphylaxis. (Patient taking differently: Inject 0.3 mg into the muscle once as needed for anaphylaxis.) 1 each 1   famotidine (PEPCID) 20 MG tablet Take 1 tablet (20 mg total) by mouth 2 (two) times daily. 28 tablet 0   fluticasone (FLONASE) 50 MCG/ACT nasal spray Place 1 spray into both nostrils daily. 16 g 2   Multiple Vitamin (MULTIVITAMIN WITH MINERALS) TABS tablet Take 1 tablet by mouth daily.     potassium chloride SA (KLOR-CON M) 20 MEQ tablet Take 1 tablet (20 mEq total) by mouth 2 (two) times daily. 180 tablet 1   saline (AYR) GEL Place 1 application into both nostrils at bedtime. 14.1 g 11   spironolactone (ALDACTONE) 25 MG tablet Take 1 tablet (25 mg total) by mouth daily. 90 tablet 3   tamsulosin (FLOMAX) 0.4 MG CAPS capsule Take 1 capsule (0.4 mg total) by mouth daily after supper. 30 capsule 2   torsemide (DEMADEX) 100 MG tablet Take 1 tablet (100 mg total) by mouth daily. 30 tablet 11   No current facility-administered medications for this encounter.    Allergies  Allergen Reactions   Ace Inhibitors Anaphylaxis   Black Cherry Fruit Extract Marcelline Mates Extract] Anaphylaxis    Tongue   Grenadine Flavor [Flavoring Agent] Anaphylaxis      Social History   Socioeconomic History   Marital status: Single    Spouse name: Not on file   Number of children: Not on file   Years of education: Not on file   Highest education level: Not on file  Occupational History   Not on file  Tobacco Use   Smoking status: Former    Packs/day: 1.00    Years: 36.00    Pack years: 36.00    Types: Cigarettes    Quit date: 01/08/2021    Years since quitting: 0.5   Smokeless tobacco: Never  Substance and Sexual Activity   Alcohol use: Yes    Alcohol/week: 1.0 standard drink    Types: 1 Shots of liquor per week    Comment: socially   Drug use: Yes    Types: Marijuana    Comment: every other week   Sexual activity: Not on file  Other Topics Concern   Not on file  Social History  Narrative   Not on file   Social Determinants of Health   Financial Resource Strain: Not on file  Food Insecurity: Not on file  Transportation Needs: Not on file  Physical Activity: Not on file  Stress: Not on file  Social Connections: Not on file  Intimate Partner Violence: Not on file      Family History  Problem Relation Age of Onset   Diabetes Mother  Diabetes Father     Vitals:   07/15/21 1411  BP: (!) 138/98  Pulse: 88  SpO2: 90%  Weight: (!) 157.9 kg (348 lb)  Height: 6\' 1"  (1.854 m)   Wt Readings from Last 3 Encounters:  07/15/21 (!) 157.9 kg (348 lb)  07/09/21 (!) 156.5 kg (345 lb)  06/26/21 (!) 156.2 kg (344 lb 5.7 oz)     PHYSICAL EXAM: General:  Arrived in wheel chair.  HEENT: normal Neck: supple. JVP 9-10  Carotids 2+ bilat; no bruits. No lymphadenopathy or thryomegaly appreciated. Cor: PMI nondisplaced. Regular rate & rhythm. No rubs, gallops or murmurs. Lungs: clear on 3 liters [place].  Abdomen: soft, nontender, distended. No hepatosplenomegaly. No bruits or masses. Good bowel sounds. Extremities: no cyanosis, clubbing, rash, R and LLE 2+ edema Neuro: alert & oriented x 3, cranial nerves grossly intact. moves all 4 extremities w/o difficulty. Affect pleasant.  ECG:SR 76 bpm personally checkec.    ASSESSMENT & PLAN: 1. HFpEF  -Echo EF 60-65%. RV normal . Grade IDD -NYHA III. Volume status elelvated. Suspect in the setting of high sodium diet and high fluid intake. Instructed to limit fluid intake to < 2 liters per day and low sodium diet.  -Volume status stable. Continue torsemide 100 mg daily and take extra 50 mg in the afternoon.  - Continue bisoprolol 5 mg daily.  -Allergy to ace so not a candidate for arni.   - Continue spiro 25 mg daily  - Discussed low salt food choices and limiting fluid intake to < 2 liter per day.   2. PAF - In SR today.Continue bisoprolol.  -On eliquis 5 mg twice a day  3. OSA -Using Bipap most days. Reinforced  compliance.  - Needs to use Bipap daily.   4. Chronic Respiratory Failure  -Wears 3-4 liters.  - Oxygen sats stable.   5. Obesity  Body mass index is 45.91 kg/m.  6. CKD Stage IIIa Creatinine baseline around 1.3-1.6 Check BMET   He is at high risk for readmits due to poor insight and ongoing dietary compliance issues.   Referred to HFSW (PCP, Medications, Transportation, ETOH Abuse, Drug Abuse, Insurance, Financial ): Yes  Refer to Pharmacy: No Refer to Home Health: Followed by [facility]  Refer to [facility]: Yes  Refer to General Cardiology: Shared with Dr Sallyanne Kuster  Follow up in 3 weeks with APP and 8 weeks with Dr Precious Bard NP-C  3:34 PM

## 2021-07-15 NOTE — Addendum Note (Signed)
Encounter addended by: Jorge Ny, LCSW on: 07/15/2021 4:16 PM ? Actions taken: Flowsheet accepted, Clinical Note Signed

## 2021-07-15 NOTE — Patient Instructions (Signed)
INCREASE Torsemide to 100 mg in the AM and 50 mg in the PM ? ?Labs today ?We will only contact you if something comes back abnormal or we need to make some changes. ?Otherwise no news is good news! ? ?You have been referred to [facility] for further CHF management in the comfort of your home. A team member will be in contact with you to arrange a home visit ? ? ? ?Your physician recommends that you schedule a follow-up appointment in: 3-4 weeks  in the [facility] ([facility]NP) [facility] and in 8-12 weeks with Dr Aundra Dubin ? ?Do the following things EVERYDAY: ?Weigh yourself in the morning before breakfast. Write it down and keep it in a log. ?Take your medicines as prescribed ?Eat low salt foods--Limit salt (sodium) to 2000 mg per day.  ?Stay as active as you can everyday ?Limit all fluids for the day to less than 2 liters ? ?At the [facility], you and your health needs are our priority. As part of our continuing mission to provide you with exceptional heart care, we have created designated Provider Care Teams. These Care Teams include your primary Cardiologist (physician) and Advanced Practice Providers (APPs- Physician Assistants and Nurse Practitioners) who all work together to provide you with the care you need, when you need it.  ? ?You may see any of the following providers on your designated Care Team at your next follow up: ?Dr Glori Bickers ?Dr Loralie Champagne ?Darrick Grinder, NP ?Lyda Jester, PA ?Jessica Milford,NP ?Marlyce Huge, PA ?Audry Riles, PharmD ? ? ?Please be sure to bring in all your medications bottles to every appointment.  ? ? ?If you have any questions or concerns before your next appointment please send [place] a message through [place] or call our office at [phone].   ? ?TO LEAVE A MESSAGE FOR THE NURSE SELECT OPTION 2, PLEASE LEAVE A MESSAGE INCLUDING: ?YOUR NAME ?DATE OF BIRTH ?CALL BACK NUMBER ?REASON FOR CALL**this is important as we  prioritize the call backs ? ?YOU WILL RECEIVE A CALL BACK THE SAME DAY AS LONG AS YOU CALL BEFORE 4:00 PM ? ? ?

## 2021-07-17 ENCOUNTER — Other Ambulatory Visit: Payer: Self-pay

## 2021-07-17 ENCOUNTER — Emergency Department (HOSPITAL_COMMUNITY): Payer: Medicaid Other

## 2021-07-17 ENCOUNTER — Inpatient Hospital Stay (HOSPITAL_COMMUNITY)
Admission: EM | Admit: 2021-07-17 | Discharge: 2021-07-23 | DRG: 291 | Disposition: A | Discharge status: Home / Self Care | Payer: Medicaid Other | Attending: Internal Medicine | Admitting: Internal Medicine

## 2021-07-17 ENCOUNTER — Encounter (HOSPITAL_COMMUNITY): Payer: Self-pay | Admitting: Emergency Medicine

## 2021-07-17 DIAGNOSIS — J441 Chronic obstructive pulmonary disease with (acute) exacerbation: Secondary | ICD-10-CM | POA: Diagnosis present

## 2021-07-17 DIAGNOSIS — I480 Paroxysmal atrial fibrillation: Secondary | ICD-10-CM | POA: Diagnosis present

## 2021-07-17 DIAGNOSIS — Z79899 Other long term (current) drug therapy: Secondary | ICD-10-CM

## 2021-07-17 DIAGNOSIS — I130 Hypertensive heart and chronic kidney disease with heart failure and stage 1 through stage 4 chronic kidney disease, or unspecified chronic kidney disease: Principal | ICD-10-CM | POA: Diagnosis present

## 2021-07-17 DIAGNOSIS — E785 Hyperlipidemia, unspecified: Secondary | ICD-10-CM | POA: Diagnosis present

## 2021-07-17 DIAGNOSIS — I5033 Acute on chronic diastolic (congestive) heart failure: Secondary | ICD-10-CM | POA: Diagnosis present

## 2021-07-17 DIAGNOSIS — Z91119 Patient's noncompliance with dietary regimen due to unspecified reason: Secondary | ICD-10-CM

## 2021-07-17 DIAGNOSIS — N189 Chronic kidney disease, unspecified: Secondary | ICD-10-CM | POA: Diagnosis present

## 2021-07-17 DIAGNOSIS — I429 Cardiomyopathy, unspecified: Secondary | ICD-10-CM | POA: Diagnosis present

## 2021-07-17 DIAGNOSIS — Z91018 Allergy to other foods: Secondary | ICD-10-CM

## 2021-07-17 DIAGNOSIS — G4733 Obstructive sleep apnea (adult) (pediatric): Secondary | ICD-10-CM | POA: Diagnosis present

## 2021-07-17 DIAGNOSIS — J9621 Acute and chronic respiratory failure with hypoxia: Secondary | ICD-10-CM | POA: Diagnosis present

## 2021-07-17 DIAGNOSIS — E8729 Other acidosis: Secondary | ICD-10-CM | POA: Diagnosis present

## 2021-07-17 DIAGNOSIS — Z20822 Contact with and (suspected) exposure to covid-19: Secondary | ICD-10-CM | POA: Diagnosis present

## 2021-07-17 DIAGNOSIS — I509 Heart failure, unspecified: Secondary | ICD-10-CM

## 2021-07-17 DIAGNOSIS — Z888 Allergy status to other drugs, medicaments and biological substances status: Secondary | ICD-10-CM | POA: Diagnosis not present

## 2021-07-17 DIAGNOSIS — I160 Hypertensive urgency: Secondary | ICD-10-CM | POA: Diagnosis present

## 2021-07-17 DIAGNOSIS — N179 Acute kidney failure, unspecified: Secondary | ICD-10-CM | POA: Diagnosis present

## 2021-07-17 DIAGNOSIS — I5032 Chronic diastolic (congestive) heart failure: Secondary | ICD-10-CM

## 2021-07-17 DIAGNOSIS — Z6841 Body Mass Index (BMI) 40.0 and over, adult: Secondary | ICD-10-CM | POA: Diagnosis not present

## 2021-07-17 DIAGNOSIS — N1831 Chronic kidney disease, stage 3a: Secondary | ICD-10-CM | POA: Diagnosis present

## 2021-07-17 DIAGNOSIS — Z87891 Personal history of nicotine dependence: Secondary | ICD-10-CM

## 2021-07-17 DIAGNOSIS — J309 Allergic rhinitis, unspecified: Secondary | ICD-10-CM | POA: Diagnosis present

## 2021-07-17 DIAGNOSIS — Z87892 Personal history of anaphylaxis: Secondary | ICD-10-CM

## 2021-07-17 DIAGNOSIS — N400 Enlarged prostate without lower urinary tract symptoms: Secondary | ICD-10-CM | POA: Diagnosis present

## 2021-07-17 DIAGNOSIS — J9622 Acute and chronic respiratory failure with hypercapnia: Secondary | ICD-10-CM | POA: Diagnosis present

## 2021-07-17 DIAGNOSIS — I10 Essential (primary) hypertension: Secondary | ICD-10-CM | POA: Diagnosis present

## 2021-07-17 DIAGNOSIS — Z7901 Long term (current) use of anticoagulants: Secondary | ICD-10-CM

## 2021-07-17 LAB — I-STAT CHEM 8, ED
BUN: 26 mg/dL — ABNORMAL HIGH (ref 8–23)
Calcium, Ion: 1.1 mmol/L — ABNORMAL LOW (ref 1.15–1.40)
Chloride: 92 mmol/L — ABNORMAL LOW (ref 98–111)
Creatinine, Ser: 1.5 mg/dL — ABNORMAL HIGH (ref 0.61–1.24)
Glucose, Bld: 138 mg/dL — ABNORMAL HIGH (ref 70–99)
HCT: 43 % (ref 39.0–52.0)
Hemoglobin: 14.6 g/dL (ref 13.0–17.0)
Potassium: 4.4 mmol/L (ref 3.5–5.1)
Sodium: 139 mmol/L (ref 135–145)
TCO2: 42 mmol/L — ABNORMAL HIGH (ref 22–32)

## 2021-07-17 LAB — COMPREHENSIVE METABOLIC PANEL
ALT: 17 U/L (ref 0–44)
AST: 18 U/L (ref 15–41)
Albumin: 3.1 g/dL — ABNORMAL LOW (ref 3.5–5.0)
Alkaline Phosphatase: 58 U/L (ref 38–126)
Anion gap: 9 (ref 5–15)
BUN: 20 mg/dL (ref 8–23)
CO2: 37 mmol/L — ABNORMAL HIGH (ref 22–32)
Calcium: 8.8 mg/dL — ABNORMAL LOW (ref 8.9–10.3)
Chloride: 92 mmol/L — ABNORMAL LOW (ref 98–111)
Creatinine, Ser: 1.5 mg/dL — ABNORMAL HIGH (ref 0.61–1.24)
GFR, Estimated: 52 mL/min — ABNORMAL LOW (ref >60)
Glucose, Bld: 146 mg/dL — ABNORMAL HIGH (ref 70–99)
Potassium: 4.3 mmol/L (ref 3.5–5.1)
Sodium: 138 mmol/L (ref 135–145)
Total Bilirubin: 0.6 mg/dL (ref 0.3–1.2)
Total Protein: 5.9 g/dL — ABNORMAL LOW (ref 6.5–8.1)

## 2021-07-17 LAB — CBC WITH DIFFERENTIAL/PLATELET
Abs Immature Granulocytes: 0.19 10*3/uL — ABNORMAL HIGH (ref 0.00–0.07)
Basophils Absolute: 0.1 10*3/uL (ref 0.0–0.1)
Basophils Relative: 1 %
Eosinophils Absolute: 0.6 10*3/uL — ABNORMAL HIGH (ref 0.0–0.5)
Eosinophils Relative: 7 %
HCT: 43.4 % (ref 39.0–52.0)
Hemoglobin: 13.2 g/dL (ref 13.0–17.0)
Immature Granulocytes: 3 %
Lymphocytes Relative: 25 %
Lymphs Abs: 1.9 10*3/uL (ref 0.7–4.0)
MCH: 29.5 pg (ref 26.0–34.0)
MCHC: 30.4 g/dL (ref 30.0–36.0)
MCV: 96.9 fL (ref 80.0–100.0)
Monocytes Absolute: 0.7 10*3/uL (ref 0.1–1.0)
Monocytes Relative: 9 %
Neutro Abs: 4.2 10*3/uL (ref 1.7–7.7)
Neutrophils Relative %: 55 %
Platelets: 224 10*3/uL (ref 150–400)
RBC: 4.48 MIL/uL (ref 4.22–5.81)
RDW: 14.7 % (ref 11.5–15.5)
WBC: 7.6 10*3/uL (ref 4.0–10.5)
nRBC: 0.4 % — ABNORMAL HIGH (ref 0.0–0.2)

## 2021-07-17 LAB — MRSA NEXT GEN BY PCR, NASAL: MRSA by PCR Next Gen: DETECTED — AB

## 2021-07-17 LAB — RESP PANEL BY RT-PCR (FLU A&B, COVID) ARPGX2
Influenza A by PCR: NEGATIVE
Influenza B by PCR: NEGATIVE
SARS Coronavirus 2 by RT PCR: NEGATIVE

## 2021-07-17 LAB — I-STAT VENOUS BLOOD GAS, ED
Acid-Base Excess: 14 mmol/L — ABNORMAL HIGH (ref 0.0–2.0)
Bicarbonate: 43.5 mmol/L — ABNORMAL HIGH (ref 20.0–28.0)
Calcium, Ion: 1.12 mmol/L — ABNORMAL LOW (ref 1.15–1.40)
HCT: 43 % (ref 39.0–52.0)
Hemoglobin: 14.6 g/dL (ref 13.0–17.0)
O2 Saturation: 88 %
Potassium: 4.4 mmol/L (ref 3.5–5.1)
Sodium: 139 mmol/L (ref 135–145)
TCO2: 46 mmol/L — ABNORMAL HIGH (ref 22–32)
pCO2, Ven: 76.3 mmHg (ref 44–60)
pH, Ven: 7.364 (ref 7.25–7.43)
pO2, Ven: 60 mmHg — ABNORMAL HIGH (ref 32–45)

## 2021-07-17 LAB — BRAIN NATRIURETIC PEPTIDE: B Natriuretic Peptide: 14.8 pg/mL (ref 0.0–100.0)

## 2021-07-17 LAB — TROPONIN I (HIGH SENSITIVITY)
Troponin I (High Sensitivity): 14 ng/L (ref <18)
Troponin I (High Sensitivity): 13 ng/L (ref <18)

## 2021-07-17 LAB — I-STAT BETA HCG BLOOD, ED (MC, WL, AP ONLY): I-stat hCG, quantitative: <5 m[IU]/mL (ref <5)

## 2021-07-17 LAB — MAGNESIUM: Magnesium: 2.1 mg/dL (ref 1.7–2.4)

## 2021-07-17 MED ORDER — ALBUTEROL SULFATE (2.5 MG/3ML) 0.083% IN NEBU
5.0000 mg | INHALATION_SOLUTION | Freq: Once | RESPIRATORY_TRACT | Status: AC
  Administered 2021-07-17: 5 mg via RESPIRATORY_TRACT
  Filled 2021-07-17: qty 6

## 2021-07-17 MED ORDER — APIXABAN 5 MG PO TABS
5.0000 mg | ORAL_TABLET | Freq: Two times a day (BID) | ORAL | Status: DC
  Administered 2021-07-17 – 2021-07-23 (×12): 5 mg via ORAL
  Filled 2021-07-17 (×12): qty 1

## 2021-07-17 MED ORDER — FUROSEMIDE 10 MG/ML IJ SOLN
80.0000 mg | Freq: Three times a day (TID) | INTRAMUSCULAR | Status: DC
  Administered 2021-07-17 – 2021-07-18 (×2): 80 mg via INTRAVENOUS
  Filled 2021-07-17 (×2): qty 8

## 2021-07-17 MED ORDER — IPRATROPIUM-ALBUTEROL 0.5-2.5 (3) MG/3ML IN SOLN
3.0000 mL | Freq: Four times a day (QID) | RESPIRATORY_TRACT | Status: DC
  Administered 2021-07-18 – 2021-07-20 (×8): 3 mL via RESPIRATORY_TRACT
  Filled 2021-07-17 (×7): qty 3

## 2021-07-17 MED ORDER — SPIRONOLACTONE 25 MG PO TABS
25.0000 mg | ORAL_TABLET | Freq: Every day | ORAL | Status: DC
Start: 2021-07-18 — End: 2021-07-23
  Administered 2021-07-18 – 2021-07-23 (×6): 25 mg via ORAL
  Filled 2021-07-17 (×6): qty 1

## 2021-07-17 MED ORDER — FLUTICASONE PROPIONATE 50 MCG/ACT NA SUSP
1.0000 | Freq: Every day | NASAL | Status: DC
  Administered 2021-07-21 – 2021-07-23 (×3): 1 via NASAL
  Filled 2021-07-17: qty 16

## 2021-07-17 MED ORDER — FAMOTIDINE 20 MG PO TABS
20.0000 mg | ORAL_TABLET | Freq: Two times a day (BID) | ORAL | Status: DC
  Administered 2021-07-17 – 2021-07-23 (×12): 20 mg via ORAL
  Filled 2021-07-17 (×12): qty 1

## 2021-07-17 MED ORDER — TAMSULOSIN HCL 0.4 MG PO CAPS
0.4000 mg | ORAL_CAPSULE | Freq: Every day | ORAL | Status: DC
Start: 2021-07-18 — End: 2021-07-23
  Administered 2021-07-18 – 2021-07-22 (×5): 0.4 mg via ORAL
  Filled 2021-07-17 (×5): qty 1

## 2021-07-17 MED ORDER — ALBUTEROL SULFATE (2.5 MG/3ML) 0.083% IN NEBU: 2.5000 mg | INHALATION_SOLUTION | RESPIRATORY_TRACT | Status: DC | PRN

## 2021-07-17 MED ORDER — ACETAMINOPHEN 325 MG PO TABS: 650.0000 mg | ORAL_TABLET | Freq: Four times a day (QID) | ORAL | Status: DC | PRN

## 2021-07-17 MED ORDER — IPRATROPIUM BROMIDE 0.02 % IN SOLN
0.5000 mg | Freq: Once | RESPIRATORY_TRACT | Status: AC
  Administered 2021-07-17: 0.5 mg via RESPIRATORY_TRACT
  Filled 2021-07-17: qty 2.5

## 2021-07-17 MED ORDER — METHYLPREDNISOLONE SODIUM SUCC 125 MG IJ SOLR
80.0000 mg | Freq: Two times a day (BID) | INTRAMUSCULAR | Status: DC
  Administered 2021-07-18: 80 mg via INTRAVENOUS
  Filled 2021-07-17: qty 2

## 2021-07-17 MED ORDER — METHYLPREDNISOLONE SODIUM SUCC 125 MG IJ SOLR
125.0000 mg | Freq: Once | INTRAMUSCULAR | Status: AC
  Administered 2021-07-17: 125 mg via INTRAVENOUS
  Filled 2021-07-17: qty 2

## 2021-07-17 MED ORDER — FUROSEMIDE 10 MG/ML IJ SOLN
80.0000 mg | Freq: Once | INTRAMUSCULAR | Status: AC
  Administered 2021-07-17: 80 mg via INTRAVENOUS
  Filled 2021-07-17: qty 8

## 2021-07-17 MED ORDER — POTASSIUM CHLORIDE CRYS ER 20 MEQ PO TBCR
20.0000 meq | EXTENDED_RELEASE_TABLET | Freq: Two times a day (BID) | ORAL | Status: DC
  Administered 2021-07-18: 20 meq via ORAL
  Filled 2021-07-17: qty 1

## 2021-07-17 MED ORDER — AMLODIPINE BESYLATE 5 MG PO TABS
5.0000 mg | ORAL_TABLET | Freq: Every day | ORAL | Status: DC
Start: 2021-07-18 — End: 2021-07-22
  Administered 2021-07-18 – 2021-07-22 (×5): 5 mg via ORAL
  Filled 2021-07-17 (×5): qty 1

## 2021-07-17 MED ORDER — ATORVASTATIN CALCIUM 10 MG PO TABS
10.0000 mg | ORAL_TABLET | Freq: Every day | ORAL | Status: DC
  Administered 2021-07-18 – 2021-07-23 (×6): 10 mg via ORAL
  Filled 2021-07-17 (×6): qty 1

## 2021-07-17 MED ORDER — SODIUM CHLORIDE 0.9 % IV SOLN
500.0000 mg | INTRAVENOUS | Status: DC
Start: 2021-07-17 — End: 2021-07-18
  Administered 2021-07-17: 500 mg via INTRAVENOUS
  Filled 2021-07-17: qty 5

## 2021-07-17 MED ORDER — ACETAMINOPHEN 650 MG RE SUPP: 650.0000 mg | Freq: Four times a day (QID) | RECTAL | Status: DC | PRN

## 2021-07-17 MED ORDER — BISOPROLOL FUMARATE 5 MG PO TABS
5.0000 mg | ORAL_TABLET | Freq: Every day | ORAL | Status: DC
  Administered 2021-07-18 – 2021-07-23 (×6): 5 mg via ORAL
  Filled 2021-07-17 (×6): qty 1

## 2021-07-17 NOTE — Assessment & Plan Note (Addendum)
Continue with atorvastatin 

## 2021-07-17 NOTE — Assessment & Plan Note (Deleted)
? ?#)   Benign Prostatic Hyperplasia:  documented h/o such; on tamsulosin as outpatient.  ? ?Plan: monitor strict I's & O's and daily weights. Repeat BMP in AM.  continue home tamsulosin.  ? ? ?

## 2021-07-17 NOTE — ED Triage Notes (Signed)
Pt BIB [facility] from home c/o increasing shortness of breath and weight gain. Hx CHF, 5LNC at all times. Lowest SpO2 for fire/ems in the 70s. Swelling noted to pts face, abdomen and extremities. Denies pain. ?

## 2021-07-17 NOTE — Assessment & Plan Note (Deleted)
#)   Acute COPD exacerbation: in the context of a documented history of COPD, diagnosis of acute exacerbation on the basis of 3 to 4 days of progressive shortness of breath, new onset cough, increased work of breathing, which is felt to be as a secondary consequence of primary acute on chronic diastolic heart failure, as further detailed above.  This is in the context of a history of chronic hypoxic hypercapnic respiratory failure, on continuous 5 L nasal cannula at baseline, with presenting VBG demonstrating CO2 in the 70s, which appears consistent with his chronic degree of hypercapnia given metabolic compensatory changes and normal pH. Will check abg in AM to further eval.  COVID-19/influenza PCR are negative. Will add-on procalcitonin to further eval for underlying pna, and pursue two-view chest x-ray, as above outpatient respiratory regimen appears to include scheduled Advair.  Former smoker. ?  ?  ?Plan: monitor continuous pulse oxymetry. Monitor on telemetry. Solumedrol. Scheduled duonebs q6 hours. Prn albuterol inhaler. BMP in the morning. Repeat CBC in the morning. Check serum Mg and Phos levels. Will attempt additional chart review to evaluate most recent PFT results. Will start Azithromycin for benefit of shortened duration of hospitalization associated with antibiotic initiation in the setting of acute COPD exacerbation. Check ABG, as above. Add-on procalcitonin.  Two-view chest x-ray.  Further evaluation management of suspected primary acute on chronic diastolic heart failure, as above.  Insulin spirometry. ?  ?

## 2021-07-17 NOTE — Progress Notes (Addendum)
HOSPITAL MEDICINE OVERNIGHT EVENT NOTE   ? ?Notified by nursing that shortly after arrival to the medical floor patient was noted to be having some shortness of breath with bouts of hypoxia and desaturations into the 80s. ? ?Respiratory was called and patient has since been placed on BIPAP which patient is typically on anyway for his known history of OSA. ? ?Chart reviewed, patient is currently being treated for acute on chronic diastolic congestive heart failure with superimposed COPD exacerbation.  Patient is currently receiving high-dose intravenous diuretics and seems to be diuresing well. ? ?Nursing has also notified me that patient had a single sinus pause of 2.56 seconds while struggling to sit up in bed, possibly secondary to a degree of increased vagal tone.  We will continue to monitor for recurrence on telemetry.  It is worth noting that patient has a history of paroxysmal atrial fibrillation but was in sinus rhythm at the time. ? ?Marinda Elk  MD ?[facility]  ? ?ADDENDUM (3/11 4am) ? ?Nursing reports a 15 beat run of ventricular tachycardia.  No associated symptoms at the time.  Labs reviewed from this morning revealing electrolytes are all within normal range.  Continue to monitor on telemetry. ? ?Deno Lunger Karigan Cloninger ? ?ADDENDUM (3/11 6:30am) ? ?Notified by nursing that ABG now reveals pH of 7.26, PCO2 of 97, PO2 of 73.  I reviewed these ABG results with respiratory therapy.  Patient was switched to a V60 ventilation device and BiPAP settings were adjusted, set to 20/10 with the patient pulling tidal volumes of between 500 and 600 cc. ? ?I promptly went to go evaluate the patient at the bedside.  Patient is not in respiratory distress but is visibly confused and lethargic.  Patient is noted to still be strong enough to remove his own BiPAP mask at this point.  Breath sounds reveal diffuse rhonchi with intermittent wheezing without significant rales.  Abdomen is protuberant and somewhat taut  but still nontender. ? ?With switching the patient over to a V60 and adjusting the BiPAP settings will monitor patient closely over the next 1 to 2 hours with repeat ABG to be performed at approximately 7:30 AM.  Based on these results, decision can be made as to whether PCCM continues to evaluate the patient for potential intubation. ? ?Deno Lunger Emma-Lee Oddo ? ? ? ? ? ? ? ? ? ?

## 2021-07-17 NOTE — Assessment & Plan Note (Deleted)
#)   Stage IIIa chronic kidney disease: Associated baseline creatinine range of 1.4-1.7, with presenting creatinine noted to be 1.50, relative to most recent prior value of 1.41 on 07/15/2021.  We will closely monitor ensuing renal function and response to plans for escalated IV diuresis efforts in the setting of presenting acute on chronic diastolic heart failure.  ?  ?Plan: Monitor strict I's and O's and daily weights.  Attempt to avoid nephrotoxic agents.  Repeat BMP in the morning. ?

## 2021-07-17 NOTE — Assessment & Plan Note (Deleted)
#)   Essential Hypertension: documented h/o such, with outpatient antihypertensive regimen including amlodipine, bisoprolol in addition to outpatient diuretic regimen, as conveyed above.  SBP's in the ED today: In the 120s to 150s.  We will closely monitor ensuing blood pressure, ventricular response to plan for IV diuresis, while monitoring for obtaining further optimization of afterload reduction intervention in setting of presenting acute on chronic diastolic heart failure. ?  ?Plan: Close monitoring of subsequent BP via routine VS. continue home amlodipine and bisoprolol.  Diuretic regimen, as above. ?  ?  ?

## 2021-07-17 NOTE — Assessment & Plan Note (Deleted)
#)   Obstructive sleep apnea: Documented history of such, patient on nocturnal CPAP at home.  ?  ?Plan: I placed order for resumption of home nocturnal CPAP during this hospitalization. ?  ?  ?

## 2021-07-17 NOTE — ED Provider Notes (Signed)
[facility] EMERGENCY DEPARTMENT Provider Note   CSN: [id number] Arrival date & time: 07/17/21  1730     History  Chief Complaint  Patient presents with   Shortness of Breath    Zachary Hawkins is a 66 y.o. male hx of COPD and CHF on 5 L at baseline, here presenting with shortness of breath.  Patient has been having shortness of breath that is worsening over the last several days.  He states that he had weight gain and gained over 15 to 20 pounds over the last several weeks.  He states that home health came and visited him and told him that he needs to come to the ER.  Patient states that he is compliant with his torsemide.  Patient was noted to be hypoxic 70% on his baseline 5 L  The history is provided by the patient.      Home Medications Prior to Admission medications   Medication Sig Start Date End Date Taking? Authorizing Provider  ADVAIR HFA 115-21 MCG/ACT inhaler INHALE 2 PUFFS INTO THE LUNGS 2 (TWO) TIMES DAILY. 07/23/20   Charlott Rakes, MD  albuterol (VENTOLIN HFA) 108 (90 Base) MCG/ACT inhaler Inhale 2 puffs into the lungs every 4 (four) hours as needed for wheezing or shortness of breath. 02/13/21   Croitoru, Mihai, MD  amLODipine (NORVASC) 5 MG tablet Take 1 tablet (5 mg total) by mouth daily. 05/04/21 08/15/21  Darliss Cheney, MD  apixaban (ELIQUIS) 5 MG TABS tablet Take 1 tablet (5 mg total) by mouth 2 (two) times daily. 05/15/21 11/11/21  Loel Dubonnet, NP  atorvastatin (LIPITOR) 10 MG tablet Take 10 mg by mouth daily.    [provider]  bisoprolol (ZEBETA) 5 MG tablet Take 1 tablet (5 mg total) by mouth daily. 03/16/21   Pokhrel, Corrie Mckusick, MD  EPINEPHrine 0.3 mg/0.3 mL IJ SOAJ injection Inject 0.3 mg into the muscle as needed for anaphylaxis. Patient taking differently: Inject 0.3 mg into the muscle once as needed for anaphylaxis. 01/09/21   Bonnielee Haff, MD  famotidine (PEPCID) 20 MG tablet Take 1 tablet (20 mg total) by mouth 2 (two) times  daily. 01/17/21 01/17/22  Julian Hy, DO  fluticasone (FLONASE) 50 MCG/ACT nasal spray Place 1 spray into both nostrils daily. 07/09/21   Maryjane Hurter, MD  Multiple Vitamin (MULTIVITAMIN WITH MINERALS) TABS tablet Take 1 tablet by mouth daily. 01/10/21   Bonnielee Haff, MD  potassium chloride SA (KLOR-CON M) 20 MEQ tablet Take 1 tablet (20 mEq total) by mouth 2 (two) times daily. 05/15/21 11/11/21  Loel Dubonnet, NP  saline (AYR) GEL Place 1 application into both nostrils at bedtime. 07/09/21   Maryjane Hurter, MD  spironolactone (ALDACTONE) 25 MG tablet Take 1 tablet (25 mg total) by mouth daily. 04/07/21   Croitoru, Mihai, MD  tamsulosin (FLOMAX) 0.4 MG CAPS capsule Take 1 capsule (0.4 mg total) by mouth daily after supper. 03/16/21   Pokhrel, Corrie Mckusick, MD  torsemide (DEMADEX) 100 MG tablet Take 1 tablet (100 mg total) by mouth in the morning AND 0.5 tablets (50 mg total) every evening. 07/15/21   Darrick Grinder D, NP      Allergies    Ace inhibitors, Black cherry fruit extract Marcelline Mates extract], and [place]adine flavor [flavoring agent]    Review of Systems   Review of Systems  Respiratory:  Positive for shortness of breath.   All other systems reviewed and are negative.  Physical Exam Updated Vital Signs BP Marland Kitchen)  126/95    Pulse 93    Resp (!) 23    SpO2 93%  Physical Exam Vitals and nursing note reviewed.  Constitutional:      Comments: Chronically ill   HENT:     Head: Normocephalic.     Mouth/Throat:     Mouth: Mucous membranes are moist.  Eyes:     Extraocular Movements: Extraocular movements intact.     Pupils: Pupils are equal, round, and reactive to light.  Cardiovascular:     Rate and Rhythm: Normal rate and regular rhythm.  Pulmonary:     Comments: Tachypneic, diminished bilateral bases Abdominal:     General: Bowel sounds are normal.     Palpations: Abdomen is soft.  Musculoskeletal:        General: Normal range of motion.     Cervical back: Normal range of motion and  neck supple.     Comments: 2+ edema bilaterally  Skin:    General: Skin is warm.     Capillary Refill: Capillary refill takes less than 2 seconds.  Neurological:     General: No focal deficit present.     Mental Status: He is alert and oriented to person, place, and time.  Psychiatric:        Mood and Affect: Mood normal.        Behavior: Behavior normal.    ED Results / Procedures / Treatments   Labs (all labs ordered are listed, but only abnormal results are displayed) Labs Reviewed  CBC WITH DIFFERENTIAL/PLATELET - Abnormal; Notable for the following components:      Result Value   nRBC 0.4 (*)    Eosinophils Absolute 0.6 (*)    Abs Immature Granulocytes 0.19 (*)    All other components within normal limits  I-STAT CHEM 8, ED - Abnormal; Notable for the following components:   Chloride 92 (*)    BUN 26 (*)    Creatinine, Ser 1.50 (*)    Glucose, Bld 138 (*)    Calcium, Ion 1.10 (*)    TCO2 42 (*)    All other components within normal limits  I-STAT VENOUS BLOOD GAS, ED - Abnormal; Notable for the following components:   pCO2, Ven 76.3 (*)    pO2, Ven 60 (*)    Bicarbonate 43.5 (*)    TCO2 46 (*)    Acid-Base Excess 14.0 (*)    Calcium, Ion 1.12 (*)    All other components within normal limits  RESP PANEL BY RT-PCR (FLU A&B, COVID) ARPGX2  COMPREHENSIVE METABOLIC PANEL  BRAIN NATRIURETIC PEPTIDE  BLOOD GAS, VENOUS  I-STAT BETA HCG BLOOD, ED ([facility], [facility], [facility] ONLY)  TROPONIN I (HIGH SENSITIVITY)    EKG None  Radiology No results found.  Procedures Procedures     CRITICAL CARE Performed by: Wandra Arthurs   Total critical care time: 30 minutes  Critical care time was exclusive of separately billable procedures and treating other patients.  Critical care was necessary to treat or prevent imminent or life-threatening deterioration.  Critical care was time spent personally by me on the following activities: development of treatment plan with patient and/or  surrogate as well as nursing, discussions with consultants, evaluation of patient's response to treatment, examination of patient, obtaining history from patient or surrogate, ordering and performing treatments and interventions, ordering and review of laboratory studies, ordering and review of radiographic studies, pulse oximetry and re-evaluation of patient's condition.  Medications Ordered in ED Medications  albuterol (PROVENTIL) (2.5  MG/3ML) 0.083% nebulizer solution 5 mg (has no administration in time range)  ipratropium (ATROVENT) nebulizer solution 0.5 mg (has no administration in time range)  furosemide (LASIX) injection 80 mg (has no administration in time range)    ED Course/ Medical Decision Making/ A&P                           Medical Decision Making Zachary L Goldschmidt is a 66 y.o. male here presenting with shortness of breath.  Patient has a history of COPD and CHF on 5 L nasal cannula at baseline.  Concern for possible CHF and COPD exacerbation.  Patient easily desats to 70% on baseline 5 L with minimal exertion.  Patient appears volume overloaded.  Plan to get CBC and CMP and BNP and chest x-ray and will give albuterol.  Patient will likely need admission   7 pm Patient has CO2 of 76 with a pH of 7.36.  Likely acute on chronic hypercapnia.  Patient's BNP is normal.  However he appears volume overloaded.  Given Lasix and albuterol and Solu-Medrol.  Will admit for COPD and CHF exacerbation with hypoxia  Problems Addressed: Acute on chronic congestive heart failure, unspecified heart failure type [facility]): acute illness or injury COPD exacerbation ([place]): acute illness or injury  Amount and/or Complexity of Data Reviewed External Data Reviewed: notes. Labs: ordered. Decision-making details documented in ED Course. Radiology: ordered and independent interpretation performed. ECG/medicine tests: ordered and independent interpretation performed. Decision-making details documented in ED  Course.  Risk Prescription drug management. Decision regarding hospitalization.  Final Clinical Impression(s) / ED Diagnoses Final diagnoses:  None    Rx / DC Orders ED Discharge Orders     None         Drenda Freeze, MD 07/17/21 2329

## 2021-07-17 NOTE — Assessment & Plan Note (Deleted)
#)   Acute on chronic diastolic heart failure: dx of acute decompensation on the basis of presenting 3 to 4 days of progressive shortness of breath associate with orthopnea, worsening of edema in the lower extremities 10 pound weight gain over the last week evidence of increased work of breathing, and desaturations with ambulation into the 70s. This is in the context of a known history of chronic diastolic heart failure, with most recent echocardiogram performed in December 2022 notable for LVEF 65%, with echocardiogram in September 2022 demonstrating grade 1 diastolic dysfunction. Etiology leading to presenting acutely decompensated heart failure is currently unclear. Patient conveys good compliance with home diuretic therapy, which consists of torsemide 100 mg p.o. daily as well spironolactone, as well as good compliance with their additional home cardiac medications, which include bisoprolol. Overall, ACS leading to presenting acutely decompensated heart failure appears less likely at this time in the absence of any recent CP, as well as non-elevated troponin.  Will check EKG to further evaluate.  Nonelevated BNP is noted.  However, in the setting of the patient's body habitus, with metabolism of BNP occurring in peripheral tissue, suspect that this represents a corresponding falsely low value.  Present chest x-ray was suboptimal in the setting of low lung volumes, as further detailed above.  We will repeat chest x-ray, this time with 2 view approach and attempt to procure more optimal evaluation.  Of note, acute pulmonary embolism felt to be less likely in the setting of patient's reported good compliance with chronic anticoagulation on Eliquis. ?  ?Of note, patient received Lasix 80 mg IV x1 while in the ED today. Presentation warrants additional IV diuresis, as further detailed below, with close monitoring of ensuing renal function, electrolytes, and volume status, as further noted below.  As patient is already  on a BB at home, will plan to continue this.  In terms of decision-making regarding IV diuresis regimen: Home regimen torsemide 100 mg p.o. daily as equivalent of 200 mg of daily IV Lasix.  In order to see this effective dose, will initiate Lasix 80 mg IV 3 times daily, with close monitoring of ensuing urine output, volume status, and renal function.   ?  ?  ?  ?Plan: monitor strict I's & O's and daily weights. Monitor on telemetry, including trend in HR in response to diuresis. Monitor continuous pulse oximetry. Repeat BMP in the morning, including for monitoring trend of potassium, bicarbonate, and renal function in response to interval diuresis efforts. Check serum magnesium level. Close monitoring of ensuing blood pressure response to diuresis efforts, including to help guide need for improvement in afterload reduction in order to optimize cardiac output. Lasix 80 mg IV 3 times daily, as above.  May also consider addition of metolazone given prior to Lasix for synergistic benefit.  Continue outpatient BB.  Check EKG.  Trend troponin.  Resume home potassium supplementation in the form of 20 mill equivalents p.o. twice daily.  Incentive spirometry.  Two-view chest x-ray, as above. ?  ?

## 2021-07-17 NOTE — Assessment & Plan Note (Addendum)
Sinus rhythm, plan to continue telemetry monitoring and anticoagulation with apixaban.  Rate control with bisoprolol.

## 2021-07-17 NOTE — H&P (Signed)
History and Physical    PLEASE NOTE THAT DRAGON DICTATION SOFTWARE WAS USED IN THE CONSTRUCTION OF THIS NOTE.   Zachary Hawkins [id number] DOB: [birth year] DOA: 07/17/2021  PCP: Dorise Hiss, PA-C  Patient coming from: home   I have personally briefly reviewed patient's old medical records in [place]  Chief Complaint: Shortness of breath  HPI: Zachary Hawkins is a 66 y.o. male with medical history significant for chronic hypoxic hypercapnic respiratory failure on 5 L continuous nasal cannula, chronic diastolic heart failure, COPD, essential pretension, hyperlipidemia, obstructive sleep apnea on nocturnal CPAP, paroxysmal atrial fibrillation chronically anticoagulated on Eliquis, stage IIIa chronic kidney disease with baseline creatinine 1.4-1.7, who is admitted to [facility] on 07/17/2021 with suspected acute on chronic diastolic heart failure after presenting from home to [facility] ED complaining of shortness of breath.  The patient reports 3 to 4 days of progressive shortness of breath associated with orthopnea, worsening of edema in bilateral lower extremities and a 10 pound weight gain over the course of the last week.  He notes associated new mild nonproductive cough, in the absence of any hemoptysis.  Not associate with any chest pain, diaphoresis, palpitations, nausea, vomiting.  Denies any new lower extremity erythema or calf tenderness.  Confirms that he is chronically anticoagulated on Eliquis, with good associated compliance, in the setting of a history of paroxysmal atrial fibrillation.  He also denies any recent subjective fever, chills, rigors, generalized myalgias.  Has a history of chronic diastolic heart failure, with most recent echocardiogram on 04/30/2021 notable for LVEF 65%, no focal wall motion normalities, left ventricular cavity size normal, borderline LVH, right ventricular systolic function normal, and no evidence of significant valvular  pathology.  This is relative to preceding echocardiogram from September 2022, which is notable for grade 1 diastolic dysfunction.  The patient conveys good compliance with his outpatient diuretic regimen which consist of spironolactone as well as torsemide 100 mg p.o. daily.   He acknowledges baseline supplemental oxygen requirement of 5 L continuous nasal cannula, and uses CPAP on nocturnal basis in the setting of obstructive sleep apnea.  He also confirms a history of COPD in the setting of being a former smoker, noting good compliance with his outpatient respiratory regimen which includes Advair.  In the setting of the patient's progressive shortness of breath, EMS was contacted, and upon ambulating the patient at home, noted his oxygen saturations to decrease into the 70s, prompting the patient to be brought to [facility] emergency department for further evaluation and management thereof.     ED Course:  Vital signs in the ED were notable for the following: Afebrile; heart rate 94-100, blood pressure 126/95 - 155/104; respiratory rate 19-27, oxygen saturation 91 to 93% on baseline 5 L nasal cannula.  Labs were notable for the following: VBG demonstrates 7.36/76.3.  CMP notable for the following: Sodium 138, potassium 4.3, bicarbonate 37, creatinine 1.50 compared to most recent prior value of 1.41 on 07/15/2021.  CBC notable for white blood cell count 7600, hemoglobin 13.2.  BNP 14.8.  High-sensitivity troponin high COVID-19/influenza PCR negative.  Imaging and additional notable ED work-up: Chest x-ray, 1 view, evaluation of which was limited by the presence of low lung volumes; however, within these limitations, appear to show bibasilar streaky airspace opacities suggestive of atelectasis, in the absence of overt evidence of infiltrate, edema, effusion, or pneumothorax.  While in the ED, the following were administered: Lasix 80 mg IV x1, duo nebulizer x1,  Solu-Medrol 125 mg IV  x1.  Subsequently, the patient was admitted for further evaluation and management of suspected acute on chronic diastolic heart failure in spite of good compliance with outpatient diuretic regimen, with presentation also notable for suspected secondary mild acute COPD exacerbation.    Review of Systems: As per HPI otherwise 10 point review of systems negative.   Past Medical History:  Diagnosis Date   Asthma    BPH (benign prostatic hyperplasia)    Cardiomyopathy ([place]) 06/2017   EF 40-45%   CHF (congestive heart failure) (HCC)    Chronic renal insufficiency, stage 3 (moderate) (HCC)    COPD (chronic obstructive pulmonary disease) (HCC)    Diastolic dysfunction [id number]   grade 2    Hyperlipidemia    Hypertension    Hypertensive urgency    Obesity    Obesity    OSA (obstructive sleep apnea)    Paroxysmal atrial fibrillation ([place]) 2022   on Eliquis    History reviewed. No pertinent surgical history.  Social History:  reports that he quit smoking about 6 months ago. His smoking use included cigarettes. He has a 36.00 pack-year smoking history. He has never used smokeless tobacco. He reports current alcohol use of about 1.0 standard drink per week. He reports current drug use. Drug: Marijuana.   Allergies  Allergen Reactions   Ace Inhibitors Anaphylaxis   Black Cherry Fruit Extract Marcelline Mates Extract] Anaphylaxis    Tongue   Grenadine Flavor [Flavoring Agent] Anaphylaxis    Family History  Problem Relation Age of Onset   Diabetes Mother    Diabetes Father     Family history reviewed and not pertinent    Prior to Admission medications   Medication Sig Start Date End Date Taking? Authorizing Provider  ADVAIR HFA 115-21 MCG/ACT inhaler INHALE 2 PUFFS INTO THE LUNGS 2 (TWO) TIMES DAILY. 07/23/20  Yes Charlott Rakes, MD  albuterol (VENTOLIN HFA) 108 (90 Base) MCG/ACT inhaler Inhale 2 puffs into the lungs every 4 (four) hours as needed for wheezing or shortness of breath.  02/13/21  Yes Croitoru, Mihai, MD  amLODipine (NORVASC) 5 MG tablet Take 1 tablet (5 mg total) by mouth daily. 05/04/21 08/15/21 Yes Pahwani, Einar Grad, MD  apixaban (ELIQUIS) 5 MG TABS tablet Take 1 tablet (5 mg total) by mouth 2 (two) times daily. 05/15/21 11/11/21 Yes Loel Dubonnet, NP  atorvastatin (LIPITOR) 10 MG tablet Take 10 mg by mouth daily.   Yes [provider]  bisoprolol (ZEBETA) 5 MG tablet Take 1 tablet (5 mg total) by mouth daily. 03/16/21  Yes Pokhrel, Laxman, MD  EPINEPHrine 0.3 mg/0.3 mL IJ SOAJ injection Inject 0.3 mg into the muscle as needed for anaphylaxis. Patient taking differently: Inject 0.3 mg into the muscle once as needed for anaphylaxis. 01/09/21  Yes Bonnielee Haff, MD  famotidine (PEPCID) 20 MG tablet Take 1 tablet (20 mg total) by mouth 2 (two) times daily. 01/17/21 01/17/22 Yes Noemi Chapel P, DO  fluticasone (FLONASE) 50 MCG/ACT nasal spray Place 1 spray into both nostrils daily. 07/09/21  Yes Maryjane Hurter, MD  potassium chloride SA (KLOR-CON M) 20 MEQ tablet Take 1 tablet (20 mEq total) by mouth 2 (two) times daily. 05/15/21 11/11/21 Yes Loel Dubonnet, NP  saline (AYR) GEL Place 1 application into both nostrils at bedtime. 07/09/21  Yes Maryjane Hurter, MD  spironolactone (ALDACTONE) 25 MG tablet Take 1 tablet (25 mg total) by mouth daily. 04/07/21  Yes Croitoru, Dani Gobble, MD  tamsulosin [facility])  0.4 MG CAPS capsule Take 1 capsule (0.4 mg total) by mouth daily after supper. 03/16/21  Yes Pokhrel, Corrie Mckusick, MD  torsemide (DEMADEX) 100 MG tablet Take 1 tablet (100 mg total) by mouth in the morning AND 0.5 tablets (50 mg total) every evening. Patient taking differently: Take 100 mg daily 07/15/21  Yes Clegg, Amy D, NP  Multiple Vitamin (MULTIVITAMIN WITH MINERALS) TABS tablet Take 1 tablet by mouth daily. Patient not taking: Reported on 07/17/2021 01/10/21   Bonnielee Haff, MD     Objective    Physical Exam: Vitals:   07/17/21 1737 07/17/21 1738 07/17/21 1739  07/17/21 1900  BP:    (!) 155/104  Pulse: 94 94 93 (!) 101  Resp: (!) 22 19 (!) 23 (!) 27  SpO2:  91% 93% 93%    General: appears to be stated age; alert, oriented; mildly increased work of breathing noted Skin: warm, dry, no rash Head:  AT/[place] Mouth:  Oral mucosa membranes appear moist, normal dentition Neck: supple; trachea midline Heart:  RRR; did not appreciate any M/R/G Lungs: CTAB, did not appreciate any wheezes, rales, or rhonchi Abdomen: + BS; soft, ND, NT Vascular: 2+ pedal pulses b/l; 2+ radial pulses b/l Extremities: 1-2+ edema in the bilateral lower extremities, no muscle wasting Neuro: strength and sensation intact in upper and lower extremities b/l    Labs on Admission: I have personally reviewed following labs and imaging studies  CBC: Recent Labs  Lab 07/17/21 1738 07/17/21 1802  WBC 7.6  --   NEUTROABS 4.2  --   HGB 13.2 14.6   14.6  HCT 43.4 43.0   43.0  MCV 96.9  --   PLT 224  --    Basic Metabolic Panel: Recent Labs  Lab 07/15/21 1349 07/17/21 1738 07/17/21 1802  NA 139 138 139   139  K 4.7 4.3 4.4   4.4  CL 92* 92* 92*  CO2 39* 37*  --   GLUCOSE 106* 146* 138*  BUN 17 20 26*  CREATININE 1.41* 1.50* 1.50*  CALCIUM 9.3 8.8*  --    GFR: Estimated Creatinine Clearance: 79.2 mL/min (A) (by C-G formula based on SCr of 1.5 mg/dL (H)). Liver Function Tests: Recent Labs  Lab 07/17/21 1738  AST 18  ALT 17  ALKPHOS 58  BILITOT 0.6  PROT 5.9*  ALBUMIN 3.1*   No results for input(s): LIPASE, AMYLASE in the last 168 hours. No results for input(s): AMMONIA in the last 168 hours. Coagulation Profile: No results for input(s): INR, PROTIME in the last 168 hours. Cardiac Enzymes: No results for input(s): CKTOTAL, CKMB, CKMBINDEX, TROPONINI in the last 168 hours. BNP (last 3 results) No results for input(s): PROBNP in the last 8760 hours. HbA1C: No results for input(s): HGBA1C in the last 72 hours. CBG: No results for input(s): GLUCAP in the  last 168 hours. Lipid Profile: No results for input(s): CHOL, HDL, LDLCALC, TRIG, CHOLHDL, LDLDIRECT in the last 72 hours. Thyroid Function Tests: No results for input(s): TSH, T4TOTAL, FREET4, T3FREE, THYROIDAB in the last 72 hours. Anemia Panel: No results for input(s): VITAMINB12, FOLATE, FERRITIN, TIBC, IRON, RETICCTPCT in the last 72 hours. Urine analysis:    Component Value Date/Time   COLORURINE STRAW (A) 04/29/2021 1008   APPEARANCEUR CLEAR 04/29/2021 1008   LABSPEC 1.009 04/29/2021 1008   PHURINE 5.0 04/29/2021 1008   GLUCOSEU NEGATIVE 04/29/2021 1008   HGBUR SMALL (A) 04/29/2021 1008   BILIRUBINUR NEGATIVE 04/29/2021 1008   [place] 04/29/2021  [place] 04/29/2021 1008   NITRITE NEGATIVE 04/29/2021 1008   LEUKOCYTESUR NEGATIVE 04/29/2021 1008    Radiological Exams on Admission: DG Chest Port 1 View  Result Date: 07/17/2021 CLINICAL DATA:  SOB EXAM: PORTABLE CHEST 1 VIEW COMPARISON:  Chest x-ray 06/23/2021 FINDINGS: The heart and mediastinal contours are unchanged. Low lung volumes. Bibasilar streaky airspace opacities likely represent atelectasis. No definite focal consolidation. No pulmonary edema. No pleural effusion. No pneumothorax. No acute osseous abnormality. IMPRESSION: Low lung volumes with no definite active disease. Consider repeat PA and lateral view with improved inspiratory effort. Electronically Signed   By: Iven Finn M.D.   On: 07/17/2021 18:25     Assessment/Plan   Principal Problem:   Acute on chronic diastolic heart failure (HCC) Active Problems:   Stage 3a chronic kidney disease (CKD) (HCC)   Essential hypertension   OSA (obstructive sleep apnea)   Paroxysmal atrial fibrillation (HCC)   COPD with acute exacerbation (HCC)   BPH (benign prostatic hyperplasia)   Hyperlipidemia      #) Acute on chronic diastolic heart failure: dx of acute decompensation on the basis of presenting 3 to 4 days of progressive  shortness of breath associate with orthopnea, worsening of edema in the lower extremities 10 pound weight gain over the last week evidence of increased work of breathing, and desaturations with ambulation into the 70s. This is in the context of a known history of chronic diastolic heart failure, with most recent echocardiogram performed in December 2022 notable for LVEF 65%, with echocardiogram in September 2022 demonstrating grade 1 diastolic dysfunction. Etiology leading to presenting acutely decompensated heart failure is currently unclear. Patient conveys good compliance with home diuretic therapy, which consists of torsemide 100 mg p.o. daily as well spironolactone, as well as good compliance with their additional home cardiac medications, which include bisoprolol. Overall, ACS leading to presenting acutely decompensated heart failure appears less likely at this time in the absence of any recent CP, as well as non-elevated troponin.  Will check EKG to further evaluate.  Nonelevated BNP is noted.  However, in the setting of the patient's body habitus, with metabolism of BNP occurring in peripheral tissue, suspect that this represents a corresponding falsely low value.  Present chest x-ray was suboptimal in the setting of low lung volumes, as further detailed above.  We will repeat chest x-ray, this time with 2 view approach and attempt to procure more optimal evaluation.  Of note, acute pulmonary embolism felt to be less likely in the setting of patient's reported good compliance with chronic anticoagulation on Eliquis.  Of note, patient received Lasix 80 mg IV x1 while in the ED today. Presentation warrants additional IV diuresis, as further detailed below, with close monitoring of ensuing renal function, electrolytes, and volume status, as further noted below.  As patient is already on a BB at home, will plan to continue this.  In terms of decision-making regarding IV diuresis regimen: Home regimen torsemide  100 mg p.o. daily as equivalent of 200 mg of daily IV Lasix.  In order to see this effective dose, will initiate Lasix 80 mg IV 3 times daily, with close monitoring of ensuing urine output, volume status, and renal function.      Plan: monitor strict I's & O's and daily weights. Monitor on telemetry, including trend in HR in response to diuresis. Monitor continuous pulse oximetry. Repeat BMP in the morning, including for monitoring trend of potassium, bicarbonate, and renal function in response  to interval diuresis efforts. Check serum magnesium level. Close monitoring of ensuing blood pressure response to diuresis efforts, including to help guide need for improvement in afterload reduction in order to optimize cardiac output. Lasix 80 mg IV 3 times daily, as above.  May also consider addition of metolazone given prior to Lasix for synergistic benefit.  Continue outpatient BB.  Check EKG.  Trend troponin.  Resume home potassium supplementation in the form of 20 mill equivalents p.o. twice daily.  Incentive spirometry.  Two-view chest x-ray, as above.        #) Acute COPD exacerbation: in the context of a documented history of COPD, diagnosis of acute exacerbation on the basis of 3 to 4 days of progressive shortness of breath, new onset cough, increased work of breathing, which is felt to be as a secondary consequence of primary acute on chronic diastolic heart failure, as further detailed above.  This is in the context of a history of chronic hypoxic hypercapnic respiratory failure, on continuous 5 L nasal cannula at baseline, with presenting VBG demonstrating CO2 in the 70s, which appears consistent with his chronic degree of hypercapnia given metabolic compensatory changes and normal pH. Will check abg in AM to further eval.  COVID-19/influenza PCR are negative. Will add-on procalcitonin to further eval for underlying pna, and pursue two-view chest x-ray, as above outpatient respiratory regimen appears  to include scheduled Advair.  Former smoker.   Plan: monitor continuous pulse oxymetry. Monitor on telemetry. Solumedrol. Scheduled duonebs q6 hours. Prn albuterol inhaler. BMP in the morning. Repeat CBC in the morning. Check serum Mg and Phos levels. Will attempt additional chart review to evaluate most recent PFT results. Will start Azithromycin for benefit of shortened duration of hospitalization associated with antibiotic initiation in the setting of acute COPD exacerbation. Check ABG, as above. Add-on procalcitonin.  Two-view chest x-ray.  Further evaluation management of suspected primary acute on chronic diastolic heart failure, as above.  Insulin spirometry.         #) Benign Prostatic Hyperplasia:  documented h/o such; on tamsulosin as outpatient.   Plan: monitor strict I's & O's and daily weights. Repeat BMP in AM.  continue home tamsulosin.       #) Essential Hypertension: documented h/o such, with outpatient antihypertensive regimen including amlodipine, bisoprolol in addition to outpatient diuretic regimen, as conveyed above.  SBP's in the ED today: In the 120s to 150s.  We will closely monitor ensuing blood pressure, ventricular response to plan for IV diuresis, while monitoring for obtaining further optimization of afterload reduction intervention in setting of presenting acute on chronic diastolic heart failure.  Plan: Close monitoring of subsequent BP via routine VS. continue home amlodipine and bisoprolol.  Diuretic regimen, as above.       #) Hyperlipidemia: documented h/o such. On atorvastatin as outpatient.    Plan: continue home statin.        #) Obstructive sleep apnea: Documented history of such, patient on nocturnal CPAP at home.   Plan: I placed order for resumption of home nocturnal CPAP during this hospitalization.         #) Paroxysmal atrial fibrillation: Documented history of such. In setting of CHA2DS2-VASc score of 2, there is an  indication for chronic anticoagulation for thromboembolic prophylaxis. Consistent with this, patient is chronically anticoagulated on Eliquis. Home AV nodal blocking regimen. Appears to be in normal sinus rhythm at this time , but will check EKG to further evaluate.   Plan: monitor strict I's &  O's and daily weights. Repeat BMP/CBC in AM. Check serum mag level. Continue home AV nodal blocking regimen.  EKG.  Monitor on telemetry.  Continue home Eliquis.         #) Allergic Rhinitis: documented h/o such, on scheduled intranasal Flonase as outpatient.    Plan: cont home Flonase.          #) Stage IIIa chronic kidney disease: Associated baseline creatinine range of 1.4-1.7, with presenting creatinine noted to be 1.50, relative to most recent prior value of 1.41 on 07/15/2021.  We will closely monitor ensuing renal function and response to plans for escalated IV diuresis efforts in the setting of presenting acute on chronic diastolic heart failure.   Plan: Monitor strict I's and O's and daily weights.  Attempt to avoid nephrotoxic agents.  Repeat BMP in the morning.       DVT prophylaxis: SCD's + home Eliquis Code Status: Full code Family Communication: none Disposition Plan: Per Rounding Team Consults called: none;  Admission status: Inpatient; pcu   PLEASE NOTE THAT DRAGON DICTATION SOFTWARE WAS USED IN THE CONSTRUCTION OF THIS NOTE.   [place] DO [facility]  From [place]   07/17/2021, 8:28 PM

## 2021-07-18 ENCOUNTER — Inpatient Hospital Stay (HOSPITAL_COMMUNITY): Payer: Medicaid Other

## 2021-07-18 DIAGNOSIS — I480 Paroxysmal atrial fibrillation: Secondary | ICD-10-CM | POA: Diagnosis not present

## 2021-07-18 DIAGNOSIS — I5033 Acute on chronic diastolic (congestive) heart failure: Secondary | ICD-10-CM | POA: Diagnosis not present

## 2021-07-18 DIAGNOSIS — N1831 Chronic kidney disease, stage 3a: Secondary | ICD-10-CM | POA: Diagnosis not present

## 2021-07-18 DIAGNOSIS — J441 Chronic obstructive pulmonary disease with (acute) exacerbation: Secondary | ICD-10-CM | POA: Diagnosis not present

## 2021-07-18 LAB — MAGNESIUM: Magnesium: 2.1 mg/dL (ref 1.7–2.4)

## 2021-07-18 LAB — COMPREHENSIVE METABOLIC PANEL
ALT: 20 U/L (ref 0–44)
AST: 21 U/L (ref 15–41)
Albumin: 3.5 g/dL (ref 3.5–5.0)
Alkaline Phosphatase: 61 U/L (ref 38–126)
Anion gap: 13 (ref 5–15)
BUN: 21 mg/dL (ref 8–23)
CO2: 32 mmol/L (ref 22–32)
Calcium: 9 mg/dL (ref 8.9–10.3)
Chloride: 95 mmol/L — ABNORMAL LOW (ref 98–111)
Creatinine, Ser: 1.51 mg/dL — ABNORMAL HIGH (ref 0.61–1.24)
GFR, Estimated: 52 mL/min — ABNORMAL LOW (ref >60)
Glucose, Bld: 140 mg/dL — ABNORMAL HIGH (ref 70–99)
Potassium: 4.9 mmol/L (ref 3.5–5.1)
Sodium: 140 mmol/L (ref 135–145)
Total Bilirubin: 0.5 mg/dL (ref 0.3–1.2)
Total Protein: 6.8 g/dL (ref 6.5–8.1)

## 2021-07-18 LAB — BLOOD GAS, ARTERIAL
Acid-Base Excess: 12.1 mmol/L — ABNORMAL HIGH (ref 0.0–2.0)
Acid-Base Excess: 15.9 mmol/L — ABNORMAL HIGH (ref 0.0–2.0)
Acid-Base Excess: 15.4 mmol/L — ABNORMAL HIGH (ref 0.0–2.0)
Bicarbonate: 43.5 mmol/L — ABNORMAL HIGH (ref 20.0–28.0)
Bicarbonate: 47.2 mmol/L — ABNORMAL HIGH (ref 20.0–28.0)
Bicarbonate: 44.4 mmol/L — ABNORMAL HIGH (ref 20.0–28.0)
Drawn by: 441371
Drawn by: 441371
O2 Saturation: 94.2 %
O2 Saturation: 97.5 %
O2 Saturation: 95.1 %
Patient temperature: 37.1
Patient temperature: 36.7
Patient temperature: 36.7
pCO2 arterial: 97 mmHg (ref 32–48)
pCO2 arterial: 95 mmHg (ref 32–48)
pCO2 arterial: 74 mmHg (ref 32–48)
pH, Arterial: 7.26 — ABNORMAL LOW (ref 7.35–7.45)
pH, Arterial: 7.3 — ABNORMAL LOW (ref 7.35–7.45)
pH, Arterial: 7.38 (ref 7.35–7.45)
pO2, Arterial: 73 mmHg — ABNORMAL LOW (ref 83–108)
pO2, Arterial: 93 mmHg (ref 83–108)
pO2, Arterial: 71 mmHg — ABNORMAL LOW (ref 83–108)

## 2021-07-18 LAB — CBC WITH DIFFERENTIAL/PLATELET
Abs Immature Granulocytes: 0.26 10*3/uL — ABNORMAL HIGH (ref 0.00–0.07)
Basophils Absolute: 0.1 10*3/uL (ref 0.0–0.1)
Basophils Relative: 1 %
Eosinophils Absolute: 0.1 10*3/uL (ref 0.0–0.5)
Eosinophils Relative: 1 %
HCT: 46.8 % (ref 39.0–52.0)
Hemoglobin: 14.1 g/dL (ref 13.0–17.0)
Immature Granulocytes: 3 %
Lymphocytes Relative: 11 %
Lymphs Abs: 0.9 10*3/uL (ref 0.7–4.0)
MCH: 28.8 pg (ref 26.0–34.0)
MCHC: 30.1 g/dL (ref 30.0–36.0)
MCV: 95.7 fL (ref 80.0–100.0)
Monocytes Absolute: 0.2 10*3/uL (ref 0.1–1.0)
Monocytes Relative: 3 %
Neutro Abs: 6.3 10*3/uL (ref 1.7–7.7)
Neutrophils Relative %: 81 %
Platelets: 229 10*3/uL (ref 150–400)
RBC: 4.89 MIL/uL (ref 4.22–5.81)
RDW: 14.8 % (ref 11.5–15.5)
WBC: 7.9 10*3/uL (ref 4.0–10.5)
nRBC: 0.6 % — ABNORMAL HIGH (ref 0.0–0.2)

## 2021-07-18 LAB — PHOSPHORUS: Phosphorus: 2.5 mg/dL (ref 2.5–4.6)

## 2021-07-18 LAB — PROCALCITONIN: Procalcitonin: <0.1 ng/mL

## 2021-07-18 MED ORDER — MUPIROCIN 2 % EX OINT
1.0000 "application " | TOPICAL_OINTMENT | Freq: Two times a day (BID) | CUTANEOUS | Status: AC
  Administered 2021-07-18 – 2021-07-22 (×10): 1 via NASAL
  Filled 2021-07-18: qty 22

## 2021-07-18 MED ORDER — FUROSEMIDE 10 MG/ML IJ SOLN
80.0000 mg | Freq: Two times a day (BID) | INTRAMUSCULAR | Status: DC
  Administered 2021-07-18 – 2021-07-20 (×4): 80 mg via INTRAVENOUS
  Filled 2021-07-18 (×4): qty 8

## 2021-07-18 MED ORDER — AZITHROMYCIN 250 MG PO TABS: 500.0000 mg | ORAL_TABLET | Freq: Every day | ORAL | Status: DC

## 2021-07-18 MED ORDER — CHLORHEXIDINE GLUCONATE CLOTH 2 % EX PADS
6.0000 | MEDICATED_PAD | Freq: Every day | CUTANEOUS | Status: DC
Start: 2021-07-18 — End: 2021-07-23
  Administered 2021-07-18 – 2021-07-22 (×4): 6 via TOPICAL

## 2021-07-18 NOTE — Assessment & Plan Note (Addendum)
Acute on chronic hypoxemic and hypercapnic respiratory failure.   Echocardiogram from 04/2021 with preserved LV EF 60 to 65% with preserved RV systolic function. No significant valvular disease.  Urine output over last 24 hrs is 1,248ml  His po intake is documented at 1,320 ml  Today with positive oxygen desaturation down to 89 and 88% on 5 L/min per [place]    Plan to resume diuresis with IV furosemide 80 mg IV q12 hrs Continue with spironolactone and will add dapagliflozin.  At home patient on torsemide 100 mg in am and 50 mg pm.  Continue to target further negative fluid balance.  Further fluid restriction to 1000 ml per day.

## 2021-07-18 NOTE — Progress Notes (Signed)
Patient arrived to room [facility] bed 13 from emergency department. Assisted patient to bed by nursing staff. Patient alert and oriented x 4 denies pain at present time but shortness of breath noted with activity and talking desats into 80's on 5 liters nasal canula. Patient oxygen level gradually increased 90-91% after resting.Oriented patient to nursing unit and call bell system and educated patient not to get out of bed without assistance from nursing staff. ?

## 2021-07-18 NOTE — Progress Notes (Signed)
Dr. Leafy Half paged with blood gas results.Patient has some confusion this morning remains alert to self and aware that this is hospital. Will reorient patient as needed and continue with current plan of care. ?

## 2021-07-18 NOTE — Evaluation (Addendum)
Occupational Therapy Evaluation ?Patient Details ?Name: Zachary Hawkins ?MRN: [id number] ?DOB: 1955/06/18 ?Today's Date: 07/18/2021 ? ? ?History of Present Illness 66 yo male who presented with dyspnea. Pt has had 3 recent admissions for similar respiratory symptoms. Pt with the past medical history of COPD with chronic hypoxemic respiratory failure, diastolic heart failure, hypertension, paroxysmal fibrillation, chronic kidney disease and obesity class 3.  ? ?Clinical Impression ?  ?Zachary was evaluated s/p the above admission list, he reports being generally mod I PTA with assistance for heavy housework/IADLs. He lives alone in a 1 level home with assist PRN from friends/family. Upon evaluation pt was min A for bed mobility and close min G for pivotal steps. 3x transfers this session, SpO2 dropping to 87% (on 5LO2) with each stand and exertion of energy, requiring sitting rest break and cues for PLB to recover. He is set up for upper ADLs and min A for lower ADLs. He is significantly limited by poor activity tolerance with increased WOB noted. Pt will benefit from OT acutely to address the limitations listed below. Recommend d/c to home with HHOT. ?   ? ?Recommendations for follow up therapy are one component of a multi-disciplinary discharge planning process, led by the attending physician.  Recommendations may be updated based on patient status, additional functional criteria and insurance authorization.  ? ?Follow Up Recommendations ? Home health OT  ?  ?   ?Patient can return home with the following A little help with walking and/or transfers;A little help with bathing/dressing/bathroom;Assist for transportation;Assistance with cooking/housework ? ?  ?Functional Status Assessment ? Patient has had a recent decline in their functional status and demonstrates the ability to make significant improvements in function in a reasonable and predictable amount of time.  ?Equipment Recommendations ? None recommended by  OT  ?  ?   ?Precautions / Restrictions Precautions ?Precautions: Fall ?Precaution Comments: watch O2 ?Restrictions ?Weight Bearing Restrictions: No  ? ?  ? ?Mobility Bed Mobility ?Overal bed mobility: Needs Assistance ?Bed Mobility: Supine to Sit ?  ?  ?Supine to sit: Min assist ?  ?  ?General bed mobility comments: for trunk elevation ?  ? ?Transfers ?Overall transfer level: Needs assistance ?Equipment used: None ?Transfers: Bed to chair/wheelchair/BSC, Sit to/from Stand ?Sit to Stand: Min guard ?  ?  ?Step pivot transfers: Min guard ?  ?  ?General transfer comment: no AD, desat to 87% with standing. cues for PLB ?  ? ?  ?Balance Overall balance assessment: Needs assistance ?Sitting-balance support: Feet supported ?Sitting balance-Leahy Scale: Good ?  ?  ?Standing balance support: No upper extremity supported, During functional activity ?Standing balance-Leahy Scale: Fair ?  ?  ?  ?  ?  ?  ?  ?  ?  ?  ?  ?  ?   ? ?ADL either performed or assessed with clinical judgement  ? ?ADL Overall ADL's : Needs assistance/impaired ?Eating/Feeding: Independent;Sitting ?  ?Grooming: Min guard;Standing ?  ?Upper Body Bathing: Set up;Sitting ?  ?Lower Body Bathing: Minimal assistance;Sit to/from stand ?  ?Upper Body Dressing : Set up;Sitting ?  ?Lower Body Dressing: Minimal assistance;Sit to/from stand ?  ?Toilet Transfer: Min guard;Ambulation ?  ?Toileting- Clothing Manipulation and Hygiene: Supervision/safety;Sitting/lateral lean ?  ?  ?  ?Functional mobility during ADLs: Min guard ?General ADL Comments: limited by poor activity tolerance with increased WOB and desaturation on 4L o2  ? ? ? ?Vision Baseline Vision/History: 0 No visual deficits ?Ability to See in Adequate Light: 0  Adequate ?Vision Assessment?: No apparent visual deficits  ?   ? ?Pertinent Vitals/Pain Pain Assessment ?Pain Assessment: No/denies pain  ? ? ? ?Hand Dominance Right ?  ?Extremity/Trunk Assessment Upper Extremity Assessment ?Upper Extremity Assessment:  Overall WFL for tasks assessed ?  ?Lower Extremity Assessment ?Lower Extremity Assessment: Defer to PT evaluation ?  ?Cervical / Trunk Assessment ?Cervical / Trunk Assessment: Other exceptions ?Cervical / Trunk Exceptions: increased body habitus ?  ?Communication Communication ?Communication: No difficulties ?  ?Cognition Arousal/Alertness: Awake/alert ?Behavior [facility] for tasks assessed/performed ?Overall Cognitive Status: Within Functional Limits for tasks assessed ?  ?  ?  ?  ?  ?  ?  ?  ?  ?  ?  ?  ?  ?  ?  ?  ?General Comments: pt tangentail and requires re-direction to task. pretty good insigh to safety/deficits ?  ?  ?General Comments  VSS on RA ? ?  ?   ?   ? ? ?Home Living Family/patient expects to be discharged to:: Private residence ?Living Arrangements: Alone ?Available Help at Discharge: Friend(s);Available PRN/intermittently ?Type of Home: House ?Home Access: Stairs to enter ?Entrance Stairs-Number of Steps: 3+1 ?Entrance Stairs-Rails: None ?Home Layout: One level ?  ?  ?Bathroom Shower/Tub: Tub/shower unit ?  ?Bathroom Toilet: Standard ?  ?  ?Home Equipment: Grab bars - tub/shower;BSC/3in1;Rolling Walker (2 wheels) ?  ?Additional Comments: Pt on 4-6 L at home; Pulse oximeter, ?  ? ?  ?Prior Functioning/Environment Prior Level of Function : Driving;Independent/Modified Independent ?  ?  ?  ?  ?  ?  ?Mobility Comments: Pt ambulates without AD; does short community distances ?ADLs Comments: Independent with ADLs, states that he had aides coming to assist with heavy IADLs/housework. they did not assist with ADLs ?  ? ?  ?  ?OT Problem List: Cardiopulmonary status limiting activity;Decreased activity tolerance ?  ?   ?OT Treatment/Interventions: Self-care/ADL training;Therapeutic exercise;Balance training;Patient/family education;Splinting;DME and/or AE instruction  ?  ?OT Goals(Current goals can be found in the care plan section) Acute Rehab OT Goals ?Patient Stated Goal: better breathing ?OT  Goal Formulation: With patient ?Time For Goal Achievement: 08/01/21 ?Potential to Achieve Goals: Good ?ADL Goals ?Pt Will Perform Lower Body Dressing: with modified independence;sit to/from stand ?Pt Will Transfer to Toilet: with modified independence;ambulating ?Pt Will Perform Tub/Shower Transfer: with supervision;ambulating ?Additional ADL Goal #1: Pt will demonstrate increased activity tolerance to perform 3 grooming tasks in standing with supervision A  ?OT Frequency: Min 2X/week ?  ? ?   ?AM-PAC OT "6 Clicks" Daily Activity     ?Outcome Measure Help from another person eating meals?: None ?Help from another person taking care of personal grooming?: A Little ?Help from another person toileting, which includes using toliet, bedpan, or urinal?: A Little ?Help from another person bathing (including washing, rinsing, drying)?: A Little ?Help from another person to put on and taking off regular upper body clothing?: None ?Help from another person to put on and taking off regular lower body clothing?: A Little ?6 Click Score: 20 ?  ?End of Session Equipment Utilized During Treatment: Oxygen ?Nurse Communication: Mobility status ? ?Activity Tolerance: Patient tolerated treatment well ?Patient left: in chair;with call bell/phone within reach;with chair alarm set;with family/visitor present ? ?OT Visit Diagnosis: Unsteadiness on feet (R26.81);Other abnormalities of gait and mobility (R26.89)  ?              ?Time: 0865-78461405-1436 ?OT Time Calculation (min): 31 min ?Charges:  OT General Charges ?$  OT Visit: 1 Visit ?OT Evaluation ?$OT Eval Moderate Complexity: 1 Mod ?OT Treatments ?$Self Care/Home Management : 8-22 mins ? ? ?Krishna Heuer A Donivan Thammavong ?07/18/2021, 2:55 PM ?

## 2021-07-18 NOTE — Progress Notes (Signed)
2159 Received call from central telemetry patient had sinus pause 2.56 and heart rate drop down in 30's nonsustained returning to sinus rhythm.Patient verbalized he was trying to sit up in bed and felt a little funny then laid back down. Patient denies pain at present time blood pressure 144/89.Will notify on call MD. ?

## 2021-07-18 NOTE — Hospital Course (Addendum)
Mr. Kinner was admitted to the hospital with the working diagnosis of decompensated heart failure, acute on chronic hypoxemic and hypercapnic respiratory failure.   66 yo male with the past medical history of COPD with chronic hypoxemic respiratory failure, diastolic heart failure, hypertension, paroxysmal fibrillation, chronic kidney disease and obesity class 3 who presented with dyspnea. Reported 3 to 4 days of progressive dyspnea, associated with orthopnea and lower extremity edema. 10 lbs weight gain in last 7 days. On his initial physical examination his blood pressure was 126/95, HR 94 to 100, RR 19 to 27, 02 saturation 91 to 93 on 5L min per [place]. Heart with S1 and S2 present and rhythmic, lungs with no wheezing, abdomen protuberant and positive lower extremity edema.   Na 138, K 4,3 CL 92, bicarbonate 37, glucose 146, bun 20, cr 1,50 VBG 7.36 Pc02 60, bicarbonate 46  Wbc 7.6 hgb 13.2 hct 43 plt 224   Sars covid 19 negative   Chest radiograph with hypoinflation and bilateral atelectasis at bases.   EKG 94 bpm, normal axis, normal intervals, sinus rhythm, with no significant ST segment or T wave changes.   Patient was placed on diuresis and non invasive mechanical ventilation with improvement on pH and respiratory failure.   Volume status improving and patient has been wean off to nasal cannula with good toleration.  At home is on 4 to 5 L of supplemental 02 per [place]

## 2021-07-18 NOTE — Assessment & Plan Note (Addendum)
Blood pressure systolic [id number] to [id number] mmHg Continue blood pressure control with amlodipine and bisoprolol. Continue diuresis with furosemide and spironolactone.

## 2021-07-18 NOTE — Assessment & Plan Note (Addendum)
Continue with bronchodilator therapy and oxymetry monitoring. ?COPD exacerbation has been ruled out.   ?

## 2021-07-18 NOTE — Progress Notes (Signed)
0400 Dr.Shalhoub aware of nonsustained VT no new orders received.Will continue with current plan of care. ?

## 2021-07-18 NOTE — Assessment & Plan Note (Addendum)
CKD stage 3a   Patient has responded well to diuresis, his renal function today with elevated cr at 1,60 with K at 3,6 and serum bicarbonate at 33.  Plan to continue diuresis with oral torsemide in am. Follow up renal function in am. Encourage out of bed to chair and increase mobility.

## 2021-07-18 NOTE — Assessment & Plan Note (Signed)
No signs of urinary retention  

## 2021-07-18 NOTE — Significant Event (Signed)
Rapid Response Event Note  ? ?Reason for Call :  ?Progressive confusion t/o night ? ?Pt admitted from ED for CHF. T/o the night, pt has had increased FiO2 demand(5L [place]>CPAP<60% bipap) and progressive confusion. ?Initial Focused Assessment:  ?Pt lying in bed with eyes closed. He will arouse to repeated verbal stimulation, follow commands, and move all extremities. Pt goes back to sleep immediately when not stimulated. Lungs diminished with exp wheezes.  Skin warm and dry. ? ?HR-97, BP, RR-19, SpO2-100% on .60 bipap. ? ?VBG done at 1802 last night showing compensated resp acidosis. ABG done at 0505 this AM showing respiratory acidosis-now uncompensated.  ? ?Interventions:  ?No RRT interventions needed at this time. ? ?Plan of Care:  ?CPAP changed to bipap. Plan for repeat ABG at 0730/0800. Low threshold to move pt to ICU and intubated if ABG unchanged or worse. Continue to monitor pt closely. Call RRT if further assistance needed.   ? ?Event Summary:  ? ?MD Notified: Dr. Leafy Half aware of situation and saw pt PTA RRT.  ?Call Time:0606 ?Arrival IONG:2952 ?End WUXL:2440 ? ?Terrilyn Saver, RN ?

## 2021-07-18 NOTE — Progress Notes (Signed)
Received call from central telemetry that patient had 15 beat run of ventricular tachycardia returning to sinus rhythm.When entered room patient was asleep. Awaken patient denies chest pain B/P 160/91. Will notify on call MD and continue current plan of care. ?

## 2021-07-18 NOTE — Progress Notes (Signed)
Rapid response paged to come see patient. ?

## 2021-07-18 NOTE — Progress Notes (Signed)
Patient is on NIV qhs at this time tolerating it fairly well. Education provided to the patient on the proper use of mask.  ?

## 2021-07-18 NOTE — Progress Notes (Signed)
Patient taken off Bipap post ABG results to trial being off.  RN notified.  Patient placed on [place] 5L with humidity. No increased WOB at this time. RT will continue to monitor. ?

## 2021-07-18 NOTE — Progress Notes (Signed)
?Progress Note ? ? ?Patient: Zachary Hawkins [id number] DOB: 02-Jul-1955 DOA: 07/17/2021     1 ?DOS: the patient was seen and examined on 07/18/2021 ?  ?Brief hospital course: ?Mr. Murtagh was admitted to the hospital with the working diagnosis of decompensated heart failure, acute on chronic hypoxemic and hypercapnic respiratory failure.  ? ?66 yo male with the past medical history of COPD with chronic hypoxemic respiratory failure, diastolic heart failure, hypertension, paroxysmal fibrillation, chronic kidney disease and obesity class 3 who presented with dyspnea. Reported 3 to 4 days of progressive dyspnea, associated with orthopnea and lower extremity edema. 10 lbs weight gain in last 7 days. On his initial physical examination his blood pressure was 126/95, HR 94 to 100, RR 19 to 27, 02 saturation 91 to 93 on 5L min per [place]. Heart with S1 and S2 present and rhythmic, lungs with no wheezing, abdomen protuberant and positive lower extremity edema.  ? ?Na 138, K 4,3 CL 92, bicarbonate 37, glucose 146, bun 20, cr 1,50 ?VBG 7.36 Pc02 60, bicarbonate 46  ?Wbc 7.6 hgb 13.2 hct 43 plt 224  ? ?Sars covid 19 negative  ? ?Chest radiograph with hypoinflation and bilateral atelectasis at bases.  ? ?EKG 94 bpm, normal axis, normal intervals, sinus rhythm, with no significant ST segment or T wave changes.  ? ?Patient was placed on diuresis and non invasive mechanical ventilation with improvement on pH and respiratory failure.  ? ? ? ?Assessment and Plan: ?* Acute on chronic diastolic heart failure ([place]) ?Acute on chronic hypoxemic and hypercapnic respiratory failure.  ?Patient with improved dyspnea, now wean off to nasal cannula with good toleration. ? ?Documented urine output is 2,575 ml.  ? ?Continue diuresis with furosemide, will decrease to 80 mg IV q12 hrs. ?Continue blood pressure control with amlopdipine and bisoprolol.  ?Continue with spironolactone.  ? ?Echocardiogram from 04/2021 with preserved LV EF 60 to 65% with  preserved RV systolic function. No significant valvular disease.  ?Out of bed to chair tid with meals. ?PT and OT  ? ?COPD with acute exacerbation ([place]) ?Patient with no wheezing or increase phlegm production.  ?Oxymetry and respiratory acidosis have improved with diuresis.  ? ?Plan to discontinue systemic steroids and antibiotic therapy. ?Continue with bronchodilator therapy and oxymetry monitoring.  ? ?Paroxysmal atrial fibrillation ([place]) ?Patient on sinus rhythm, plan to continue telemetry monitoring and anticoagulation with apixaban.  ?  ? ?Stage 3a chronic kidney disease (CKD) ([place]) ?Renal function with serum cr at 1,51 with K at 4.9 and serum bicarbonate at 33. ?Will continue diuresis with furosemide and follow up renal function in am. ?Hold on K supplementation due to risk of hyperkalemia.  ? ?Essential hypertension ?Continue blood pressure monitoring. ?Systolic blood pressure has improved from 170 to 120 mmHg.  ?Continue with diuresis.  ? ?OSA (obstructive sleep apnea) ?Continue with Cpap.  ? ?BPH (benign prostatic hyperplasia) ?No signs of urinary retention. ? ? ?Hyperlipidemia ?Continue with atorvastatin  ?  ?  ? ?Class 3 obesity (HCC) ?Calculated BMI is 45.2  ? ? ? ? ?  ? ?Subjective: Patient is feeling better, now transitioned to [place] with good toleration  ? ?Physical Exam: ?Vitals:  ? 07/18/21 0817 07/18/21 0928 07/18/21 1100 07/18/21 1118  ?BP:    (!) 128/93  ?Pulse: 93 89 99 88  ?Resp: 20 20 18 20   ?Temp:    98.2 ?F (36.8 ?C)  ?TempSrc:    Oral  ?SpO2: 99% 94% 94% 91%  ?Weight:      ?  Height:      ? ?Neurology awake and alert ?ENT with no pallor ?Cardiovascular with S1 and S2 present and rhythmic with no gallops or murmurs ?No JVD (wide neck) ?Respiratory with scattered rales but no wheezing ?Abdomen protuberant but not tender   ?Data Reviewed: ? ? ? ?Family Communication: no family at the bedside  ? ?Disposition: ?Status is: Inpatient ?Remains inpatient appropriate because: heart failure  ? Planned  Discharge Destination: Home ? ?Author: ?Tawni Millers, MD ?07/18/2021 1:21 PM ? ?For on call review [email or web address].  ?

## 2021-07-18 NOTE — Assessment & Plan Note (Signed)
Continue with Cpap 

## 2021-07-18 NOTE — Assessment & Plan Note (Signed)
Calculated BMI is 45.2  ?

## 2021-07-19 LAB — BASIC METABOLIC PANEL
Anion gap: 9 (ref 5–15)
BUN: 27 mg/dL — ABNORMAL HIGH (ref 8–23)
CO2: 39 mmol/L — ABNORMAL HIGH (ref 22–32)
Calcium: 8.8 mg/dL — ABNORMAL LOW (ref 8.9–10.3)
Chloride: 92 mmol/L — ABNORMAL LOW (ref 98–111)
Creatinine, Ser: 1.51 mg/dL — ABNORMAL HIGH (ref 0.61–1.24)
GFR, Estimated: 52 mL/min — ABNORMAL LOW (ref >60)
Glucose, Bld: 149 mg/dL — ABNORMAL HIGH (ref 70–99)
Potassium: 4.7 mmol/L (ref 3.5–5.1)
Sodium: 140 mmol/L (ref 135–145)

## 2021-07-19 NOTE — Progress Notes (Signed)
Mobility Specialist Progress Note ? ? 07/19/21 1323  ?Mobility  ?Activity Ambulated with assistance in hallway  ?Level of Assistance Modified independent, requires aide device or extra time  ?Assistive Device Front wheel walker  ?Distance Ambulated (ft) 340 ft  ?Activity Response Tolerated well  ?$Mobility charge 1 Mobility  ? ?Received pt on the EOB ready and willing for mobility. No physical assist needed throughout session, pt ranging from 90 - 94% SpO2 on 4LO2. Pt slightly impulsive and requiring VC on pacing during ambulation. Returned back to EOB where pt c/o on minimal SOB once done. Left call bell by side w/ needs met.   ? ?Holland Falling ?Mobility Specialist ?Phone Number [phone] ? ?

## 2021-07-19 NOTE — Evaluation (Signed)
Physical Therapy Evaluation ?Patient Details ?Name: Zachary Hawkins ?MRN: [id number] ?DOB: 10/03/1955 ?Today's Date: 07/19/2021 ? ?History of Present Illness ? 66 yo male who presented with dyspnea. Pt has had 3 recent admissions for similar respiratory symptoms. Pt with the past medical history of COPD with chronic hypoxemic respiratory failure, diastolic heart failure, hypertension, paroxysmal fibrillation, chronic kidney disease and obesity class 3.  ?Clinical Impression ? PTA, pt lives alone, uses RW intermittently and requires assist for heavy housework/IADL's. Pt presents with significantly decreased cardiopulmonary endurance. Pt ambulating 200 feet with no assistive device at a supervision level. Desaturation to 80% on 4-6L O2, rebound > 88% with seated rest break, RR up to 55. Cues for activity pacing and breathing. Will benefit from follow up HHPT to address deficits and maximize functional mobility. ?   ? ?Recommendations for follow up therapy are one component of a multi-disciplinary discharge planning process, led by the attending physician.  Recommendations may be updated based on patient status, additional functional criteria and insurance authorization. ? ?Follow Up Recommendations Home health PT ? ?  ?Assistance Recommended at Discharge PRN  ?Patient can return home with the following ? A little help with bathing/dressing/bathroom;Assistance with cooking/housework;Assist for transportation ? ?  ?Equipment Recommendations Rollator (4 wheels) (tall and bariatric)  ?Recommendations for Other Services ?    ?  ?Functional Status Assessment Patient has had a recent decline in their functional status and demonstrates the ability to make significant improvements in function in a reasonable and predictable amount of time.  ? ?  ?Precautions / Restrictions Precautions ?Precautions: Fall ?Precaution Comments: watch O2/RR ?Restrictions ?Weight Bearing Restrictions: No  ? ?  ? ?Mobility ? Bed Mobility ?  ?  ?  ?  ?   ?  ?  ?General bed mobility comments: OOB in chair ?  ? ?Transfers ?Overall transfer level: Needs assistance ?Equipment used: None ?Transfers: Sit to/from Stand ?Sit to Stand: Supervision ?  ?  ?  ?  ?  ?  ?  ? ?Ambulation/Gait ?Ambulation/Gait assistance: Supervision ?Gait Distance (Feet): 200 Feet ?Assistive device: None ?Gait Pattern/deviations: Step-through pattern, Decreased stride length, Wide base of support ?Gait velocity: decreased ?  ?  ?General Gait Details: Wider BOS, overall steady, needs cues for activity pacing and controlled breathing ? ?Stairs ?  ?  ?  ?  ?  ? ?Wheelchair Mobility ?  ? ?Modified Rankin (Stroke Patients Only) ?  ? ?  ? ?Balance Overall balance assessment: Needs assistance ?Sitting-balance support: Feet supported ?Sitting balance-Leahy Scale: Good ?  ?  ?Standing balance support: No upper extremity supported, During functional activity ?Standing balance-Leahy Scale: Good ?  ?  ?  ?  ?  ?  ?  ?  ?  ?  ?  ?  ?   ? ? ? ?Pertinent Vitals/Pain Pain Assessment ?Pain Assessment: No/denies pain  ? ? ?Home Living Family/patient expects to be discharged to:: Private residence ?Living Arrangements: Alone ?Available Help at Discharge: Friend(s);Available PRN/intermittently ?Type of Home: House ?Home Access: Stairs to enter ?Entrance Stairs-Rails: None ?Entrance Stairs-Number of Steps: 3+1 ?  ?Home Layout: One level ?Home Equipment: Grab bars - tub/shower;BSC/3in1;Rolling Walker (2 wheels) ?Additional Comments: Pt on 4-6 L at home; Pulse oximeter,  ?  ?Prior Function Prior Level of Function : Driving;Independent/Modified Independent ?  ?  ?  ?  ?  ?  ?Mobility Comments: Pt ambulates without AD; does short community distances ?ADLs Comments: Independent with ADLs, states that he had aides coming to  assist with heavy IADLs/housework. they did not assist with ADLs ?  ? ? ?Hand Dominance  ? Dominant Hand: Right ? ?  ?Extremity/Trunk Assessment  ? Upper Extremity Assessment ?Upper Extremity  Assessment: Defer to OT evaluation ?  ? ?Lower Extremity Assessment ?Lower Extremity Assessment: Overall WFL for tasks assessed ?  ? ?Cervical / Trunk Assessment ?Cervical / Trunk Assessment: Other exceptions ?Cervical / Trunk Exceptions: increased body habitus  ?Communication  ? Communication: No difficulties  ?Cognition Arousal/Alertness: Awake/alert ?Behavior During Therapy: [facility] for tasks assessed/performed ?Overall Cognitive Status: Within Functional Limits for tasks assessed ?  ?  ?  ?  ?  ?  ?  ?  ?  ?  ?  ?  ?  ?  ?  ?  ?  ?  ?  ? ?  ?General Comments   ? ?  ?Exercises    ? ?Assessment/Plan  ?  ?PT Assessment Patient needs continued PT services  ?PT Problem List Decreased strength;Decreased activity tolerance;Decreased balance;Decreased mobility;Cardiopulmonary status limiting activity ? ?   ?  ?PT Treatment Interventions DME instruction;Gait training;Stair training;Functional mobility training;Therapeutic activities;Therapeutic exercise;Balance training;Patient/family education   ? ?PT Goals (Current goals can be found in the Care Plan section)  ?Acute Rehab PT Goals ?Patient Stated Goal: hang out with grandkids ?PT Goal Formulation: With patient ?Time For Goal Achievement: 08/02/21 ?Potential to Achieve Goals: Good ? ?  ?Frequency Min 3X/week ?  ? ? ?Co-evaluation   ?  ?  ?  ?  ? ? ?  ?AM-PAC PT "6 Clicks" Mobility  ?Outcome Measure Help needed turning from your back to your side while in a flat bed without using bedrails?: None ?Help needed moving from lying on your back to sitting on the side of a flat bed without using bedrails?: A Little ?Help needed moving to and from a bed to a chair (including a wheelchair)?: A Little ?Help needed standing up from a chair using your arms (e.g., wheelchair or bedside chair)?: A Little ?Help needed to walk in hospital room?: A Little ?Help needed climbing 3-5 steps with a railing? : A Lot ?6 Click Score: 18 ? ?  ?End of Session Equipment Utilized During Treatment:  Oxygen ?Activity Tolerance: Patient tolerated treatment well ?Patient left: in chair;with call bell/phone within reach ?Nurse Communication: Mobility status ?PT Visit Diagnosis: Difficulty in walking, not elsewhere classified (R26.2) ?  ? ?Time: 6314-9702 ?PT Time Calculation (min) (ACUTE ONLY): 18 min ? ? ?Charges:   PT Evaluation ?$PT Eval Moderate Complexity: 1 Mod ?  ?  ?   ? ? ?Lillia Pauls, PT, DPT ?Acute Rehabilitation Services ?Pager [phone] ?Office [phone] ? ? ?Carloine Ernestina Penna ?07/19/2021, 4:18 PM ? ?

## 2021-07-20 DIAGNOSIS — N179 Acute kidney failure, unspecified: Secondary | ICD-10-CM | POA: Diagnosis not present

## 2021-07-20 DIAGNOSIS — I480 Paroxysmal atrial fibrillation: Secondary | ICD-10-CM | POA: Diagnosis not present

## 2021-07-20 DIAGNOSIS — I5033 Acute on chronic diastolic (congestive) heart failure: Secondary | ICD-10-CM | POA: Diagnosis not present

## 2021-07-20 DIAGNOSIS — J441 Chronic obstructive pulmonary disease with (acute) exacerbation: Secondary | ICD-10-CM | POA: Diagnosis not present

## 2021-07-20 LAB — BASIC METABOLIC PANEL
Anion gap: 8 (ref 5–15)
BUN: 33 mg/dL — ABNORMAL HIGH (ref 8–23)
CO2: 39 mmol/L — ABNORMAL HIGH (ref 22–32)
Calcium: 8.5 mg/dL — ABNORMAL LOW (ref 8.9–10.3)
Chloride: 91 mmol/L — ABNORMAL LOW (ref 98–111)
Creatinine, Ser: 1.6 mg/dL — ABNORMAL HIGH (ref 0.61–1.24)
GFR, Estimated: 48 mL/min — ABNORMAL LOW (ref >60)
Glucose, Bld: 111 mg/dL — ABNORMAL HIGH (ref 70–99)
Potassium: 3.6 mmol/L (ref 3.5–5.1)
Sodium: 138 mmol/L (ref 135–145)

## 2021-07-20 LAB — MAGNESIUM: Magnesium: 2.3 mg/dL (ref 1.7–2.4)

## 2021-07-20 MED ORDER — IPRATROPIUM-ALBUTEROL 0.5-2.5 (3) MG/3ML IN SOLN
3.0000 mL | Freq: Three times a day (TID) | RESPIRATORY_TRACT | Status: DC
  Administered 2021-07-20 (×3): 3 mL via RESPIRATORY_TRACT
  Filled 2021-07-20 (×3): qty 3

## 2021-07-20 NOTE — TOC Initial Note (Signed)
Transition of Care (TOC) - Initial/Assessment Note  ? ? ?Patient Details  ?Name: Zachary Hawkins ?MRN: [id number] ?Date of Birth: 1955-10-28 ? ?Transition of Care (TOC) CM/SW Contact:    ?Coralee Pesa, LCSWA ?Phone Number: ?07/20/2021, 12:40 PM ? ?Clinical Narrative:                 ?CSW met with pt to discuss recommendations. At this time pt cannot get accepted for [facility] with his payor source. Application was given for personal care services through [facility]. Pt states he has a walker and oxygen at home through [place]. Pt is requesting orders for an [employer] oxygen machine. CSW updated MD, this will be an outpatient request through pt's oxygen supplier. Per MD, plan to DC tomorrow. TOC will continue to follow for DC needs.  ? ?Expected Discharge Plan: [place] ?Barriers to Discharge: Continued Medical Work up ? ? ?Patient Goals and CMS Choice ?Patient states their goals for this hospitalization and ongoing recovery are:: Pt wants to return home and be able to play with his grandkids. ?CMS Medicare.gov Compare Post Acute Care list provided to:: Patient ?Choice offered to / list presented to : Patient ? ?Expected Discharge Plan and Services ?Expected Discharge Plan: [place] ?  ?  ?  ?Living arrangements for the past 2 months: [place] ?                ?  ?  ?  ?  ?  ?  ?  ?  ?  ?  ? ?Prior Living Arrangements/Services ?Living arrangements for the past 2 months: [place] ?Lives with:: Self ?Patient language and need for interpreter reviewed:: Yes ?Do you feel safe going back to the place where you live?: Yes      ?Need for Family Participation in Patient Care: Yes (Comment) ?Care giver support system in place?: Yes (comment) ?  ?Criminal Activity/Legal Involvement Pertinent to Current Situation/Hospitalization: No - Comment as needed ? ?Activities of Daily Living ?  ?  ? ?Permission Sought/Granted ?Permission sought to share information with : Family  Supports ?Permission granted to share information with : No ?   ?   ?   ?   ? ?Emotional Assessment ?Appearance:: Appears stated age ?Attitude/Demeanor/Rapport: Engaged ?Affect (typically observed): Appropriate ?Orientation: : Oriented to Self, Oriented to Place, Oriented to  Time, Oriented to Situation ?Alcohol / Substance Use: Not Applicable ?Psych Involvement: No (comment) ? ?Admission diagnosis:  Acute on chronic diastolic heart failure ([place]) [I50.33] ?CHF (congestive heart failure) ([place]) [I50.9] ?Patient Active Problem List  ? Diagnosis Date Noted  ? Class 3 obesity ([place]) 07/18/2021  ? Acute on chronic diastolic heart failure ([place]) 07/17/2021  ? BPH (benign prostatic hyperplasia)   ? Hyperlipidemia   ? Acute on chronic respiratory failure with hypoxia and hypercapnia ([place]) 06/23/2021  ? Chronic diastolic CHF (congestive heart failure) ([place]) 06/23/2021  ? COPD with acute exacerbation ([place]) 06/23/2021  ? Acute on chronic diastolic (congestive) heart failure ([place]) 04/30/2021  ? AKI (acute kidney injury) ([place]) 04/29/2021  ? Acute exacerbation of CHF (congestive heart failure) ([place]) 04/29/2021  ? Chronic respiratory failure with hypercapnia ([place]) 04/29/2021  ? Bilateral hydrocele 04/29/2021  ? Dyspnea on exertion 04/29/2021  ? Acute respiratory failure with hypoxia ([place]) 03/10/2021  ? Urticaria 01/15/2021  ? Chronic heart failure with preserved ejection fraction ([place])   ? Angioedema 01/04/2021  ? Influenza vaccine refused 06/26/2020  ? COVID-19 vaccine series  completed 06/26/2020  ? Paroxysmal atrial fibrillation ([place]) 07/02/2019  ? Secondary hypercoagulable state ([place]) 07/02/2019  ? OSA (obstructive sleep apnea) 04/03/2019  ? History of angioedema due to ACE INHIBITORS 10/13/2017  ? Non compliance w medication regimen 07/13/2017  ? Essential hypertension 06/23/2017  ? Stage 3a chronic kidney disease (CKD) ([place])   ? Cardiomyopathy ([place]) 06/06/2017  ? Dyspnea 06/05/2017  ? ?PCP:  Dorise Hiss,  PA-C ?Pharmacy:   ?[place] [place] [facility] [address] ?[place] [address] ?Phone: [phone] Fax: [phone] ? ? ? ? ?Social Determinants of Health (SDOH) Interventions ?  ? ?Readmission Risk Interventions ?Readmission Risk Prevention Plan 06/25/2021 05/04/2021 03/13/2021  ?Transportation Screening Complete Complete Complete  ?PCP or Specialist Appt within 3-5 Days - Complete -  ?[place] or Home Care Consult - Complete -  ?Social Work Consult for [place] Planning/Counseling - Complete -  ?Palliative Care Screening - Not Applicable -  ?Medication Review Press photographer) Complete Complete Complete  ?PCP or Specialist appointment within 3-5 days of discharge Complete - Complete  ?[place] or Home Care Consult Complete - Complete  ?SW Recovery Care/Counseling Consult Complete - Complete  ?Palliative Care Screening Not Applicable - Not Applicable  ?Skilled Nursing Facility Complete - Complete  ?Some recent data might be hidden  ? ? ? ?

## 2021-07-20 NOTE — Plan of Care (Signed)
Nutrition Education Note ? ?RD consulted for nutrition education regarding CHF exacerbation. ? ?RD provided "Heart Failure Nutrition Therapy" handout from the Academy of [employer] and Dietetics. Reviewed patient's dietary recall. Provided examples on ways to decrease sodium intake in diet. Discouraged intake of processed foods and use of salt shaker. Encouraged fresh fruits and vegetables.  ? ?RD discussed why it is important for patient to adhere to diet recommendations, and emphasized the role of fluids, foods to avoid, and importance of weighing self daily. Teach back method used. ? ?Expect good compliance. ? ?Body mass index is 45.12 kg/m?Marland Kitchen Pt meets criteria for obese based on current BMI. ? ?Pt reports that his appetite is good currently and PTA. Pt reports that he would like to lose weight and not keep coming back here. Discussed that we can put in a referral for some more outpatient education, pt agreeable.  ? ?Current diet order is Heart Healthy w/ 1.2 L fluid restriction, patient is consuming approximately 100% of meals at this time. Labs and medications reviewed.  ? ? ?No further nutrition interventions warranted at this time. RD contact information provided. If additional nutrition issues arise, please re-consult RD.  ? ? ?Kirby Crigler RD, LDN ?Clinical Dietitian ?See AMiON for contact information.  ? ? ?

## 2021-07-20 NOTE — Progress Notes (Signed)
Heart Failure Nurse Navigator Progress Note ? ?Assessed for HV TOC readiness. Pt has follow up appt with Dr. Royann Shivers 3/16 and new CHF appt with AHF APP clinic 3/29.  ? ?No HV TOC clinic as pt will be established with AHF clinic.  ? ?Ozella Rocks, MSN, RN ?Heart Failure Nurse Navigator ?[phone] ? ?

## 2021-07-20 NOTE — Progress Notes (Signed)
?Progress Note ? ? ?Patient: Zachary Hawkins [id number] DOB: 1955-10-15 DOA: 07/17/2021     3 ?DOS: the patient was seen and examined on 07/20/2021 ?  ?Brief hospital course: ?Mr. Konecny was admitted to the hospital with the working diagnosis of decompensated heart failure, acute on chronic hypoxemic and hypercapnic respiratory failure.  ? ?66 yo male with the past medical history of COPD with chronic hypoxemic respiratory failure, diastolic heart failure, hypertension, paroxysmal fibrillation, chronic kidney disease and obesity class 3 who presented with dyspnea. Reported 3 to 4 days of progressive dyspnea, associated with orthopnea and lower extremity edema. 10 lbs weight gain in last 7 days. On his initial physical examination his blood pressure was 126/95, HR 94 to 100, RR 19 to 27, 02 saturation 91 to 93 on 5L min per [place]. Heart with S1 and S2 present and rhythmic, lungs with no wheezing, abdomen protuberant and positive lower extremity edema.  ? ?Na 138, K 4,3 CL 92, bicarbonate 37, glucose 146, bun 20, cr 1,50 ?VBG 7.36 Pc02 60, bicarbonate 46  ?Wbc 7.6 hgb 13.2 hct 43 plt 224  ? ?Sars covid 19 negative  ? ?Chest radiograph with hypoinflation and bilateral atelectasis at bases.  ? ?EKG 94 bpm, normal axis, normal intervals, sinus rhythm, with no significant ST segment or T wave changes.  ? ?Patient was placed on diuresis and non invasive mechanical ventilation with improvement on pH and respiratory failure.  ? ?Volume status improving and patient has been wean off to nasal cannula with good toleration.  ?At home is on 4 to 5 L of supplemental 02 per [place]  ? ?Assessment and Plan: ?* Acute on chronic diastolic heart failure ([place]) ?Acute on chronic hypoxemic and hypercapnic respiratory failure.  ? ?Echocardiogram from 04/2021 with preserved LV EF 60 to 65% with preserved RV systolic function. No significant valvular disease. ? ?Urine output over last 24 hrs is 2,750 ml  ?His edema has continue to improve.   ? ?Continue blood pressure control with amlopdipine and bisoprolol.  ?Continue with spironolactone.  ? ?Plan to transition to hone diuretic therapy in am. ? ? ? ?COPD with acute exacerbation ([place]) ?Continue with bronchodilator therapy and oxymetry monitoring. ?COPD exacerbation has been ruled out.   ? ?Paroxysmal atrial fibrillation ([place]) ?Sinus rhythm, plan to continue telemetry monitoring and anticoagulation with apixaban.  ?Rate control with bisoprolol.  ?  ? ?Acute kidney injury superimposed on chronic kidney disease ([place]) ?CKD stage 3a  ? ?Patient has responded well to diuresis, his renal function today with elevated cr at 1,60 with K at 3,6 and serum bicarbonate at 33. ? ?Plan to continue diuresis with oral torsemide in am. ?Follow up renal function in am. ?Encourage out of bed to chair and increase mobility.   ? ?Essential hypertension ? Blood pressure systolic 123456 to [id number] mmHg ?Continue blood pressure control with amlodipine and bisoprolol. ?Continue diuresis with furosemide and spironolactone.  ? ?OSA (obstructive sleep apnea) ?Continue with Cpap.  ? ?BPH (benign prostatic hyperplasia) ?No signs of urinary retention. ? ? ?Hyperlipidemia ?Continue with atorvastatin  ?  ?  ? ?Class 3 obesity (HCC) ?Calculated BMI is 45.2  ? ? ? ? ?  ? ?Subjective: Patient is feeling better, dyspnea has improved along with his lower extremity edema ? ? ?Physical Exam: ?Vitals:  ? 07/20/21 0424 07/20/21 0850 07/20/21 1118 07/20/21 1327  ?BP: 95/82 116/76    ?Pulse: 96 86    ?Resp: 17 18 18    ?Temp: 98.7 ?F (37.1 ?  C)     ?TempSrc: Oral     ?SpO2: 95% 93% 93% 95%  ?Weight: (!) 155.1 kg     ?Height:      ? ?Neurology awake and alert ?ENT with no pallor ?Cardiovascular with S1 and S2 present and rhythmic with no gallops or murmurs, no rubs ?Trace lower extremity edema ?Respiratory with no rales or wheezing, no rhonchi ?Abdomen protuberant but not distended  ?Data Reviewed: ? ? ? ?Family Communication: no family at the bedside   ? ?Disposition: ?Status is: Inpatient ?Remains inpatient appropriate because: plan to dc home in am if renal function improving.  ? Planned Discharge Destination: Home ? ?Author: ?Tawni Millers, MD ?07/20/2021 1:28 PM ? ?For on call review [email or web address].  ?

## 2021-07-20 NOTE — Progress Notes (Signed)
Occupational Therapy Treatment ?Patient Details ?Name: Zachary Hawkins ?MRN: [id number] ?DOB: Sep 18, 1955 ?Today's Date: 07/20/2021 ? ? ?History of present illness 66 yo male who presented with dyspnea. Pt has had 3 recent admissions for similar respiratory symptoms. Pt with the past medical history of COPD with chronic hypoxemic respiratory failure, diastolic heart failure, hypertension, paroxysmal fibrillation, chronic kidney disease and obesity class 3. ?  ?OT comments ? Patient continues to make steady progress towards goals in skilled OT session. Patient's session encompassed  education with regard to energy conservation, managing heart failure, and adaptive equipment. Patient motivated and receptive to all education, stating he needs to pay more attention to his fluid and food intake to prevent further admissions. Patient also receptive to long handled sponge, shoe horn, and elastic laces to promote ease with lower body dressing as patient demonstrates DOE when attempting to bend over and reach feet from a seated position. Discharge with HHOT remains appropriate, therapy will continue to follow.   ? ?Recommendations for follow up therapy are one component of a multi-disciplinary discharge planning process, led by the attending physician.  Recommendations may be updated based on patient status, additional functional criteria and insurance authorization. ?   ?Follow Up Recommendations ? Home health OT  ?  ?Assistance Recommended at Discharge    ?Patient can return home with the following ? A little help with walking and/or transfers;A little help with bathing/dressing/bathroom;Assist for transportation;Assistance with cooking/housework ?  ?Equipment Recommendations ? None recommended by OT  ?  ?Recommendations for Other Services   ? ?  ?Precautions / Restrictions Precautions ?Precautions: Fall ?Precaution Comments: watch O2/RR ?Restrictions ?Weight Bearing Restrictions: No  ? ? ?  ? ?Mobility Bed Mobility ?  ?  ?  ?   ?  ?  ?  ?General bed mobility comments: OOB in chair ?  ? ?Transfers ?  ?  ?  ?  ?  ?  ?  ?  ?  ?General transfer comment: politely declining after working recently with mobility specialist ?  ?  ?Balance   ?  ?  ?  ?  ?  ?  ?  ?  ?  ?  ?  ?  ?  ?  ?  ?  ?  ?  ?   ? ?ADL either performed or assessed with clinical judgement  ? ?ADL Overall ADL's : Needs assistance/impaired ?  ?  ?  ?  ?  ?  ?  ?  ?  ?  ?Lower Body Dressing: Sitting/lateral leans;Min guard ?  ?  ?  ?  ?  ?  ?  ?  ?General ADL Comments: Session focus on energy conservation, managing heart failure, and adaptive equipment for safe discharge home ?  ? ?Extremity/Trunk Assessment   ?  ?  ?  ?  ?  ? ?Vision   ?  ?  ?Perception   ?  ?Praxis   ?  ? ?Cognition Arousal/Alertness: Awake/alert ?Behavior During Therapy: [facility] for tasks assessed/performed ?Overall Cognitive Status: Within Functional Limits for tasks assessed ?  ?  ?  ?  ?  ?  ?  ?  ?  ?  ?  ?  ?  ?  ?  ?  ?General Comments: pt tangentail and requires re-direction to task. good insight to safety/deficits ?  ?  ?   ?Exercises   ? ?  ?Shoulder Instructions   ? ? ?  ?General Comments    ? ? ?Pertinent Vitals/  Pain       Pain Assessment ?Pain Assessment: No/denies pain ? ?Home Living   ?  ?  ?  ?  ?  ?  ?  ?  ?  ?  ?  ?  ?  ?  ?  ?  ?  ?  ? ?  ?Prior Functioning/Environment    ?  ?  ?  ?   ? ?Frequency ? Min 2X/week  ? ? ? ? ?  ?Progress Toward Goals ? ?OT Goals(current goals can now be found in the care plan section) ? Progress towards OT goals: Progressing toward goals ? ?Acute Rehab OT Goals ?Patient Stated Goal: to get all of this under control ?OT Goal Formulation: With patient ?Time For Goal Achievement: 08/01/21 ?Potential to Achieve Goals: Good  ?Plan Discharge plan remains appropriate   ? ?Co-evaluation ? ? ?   ?  ?  ?  ?  ? ?  ?AM-PAC OT "6 Clicks" Daily Activity     ?Outcome Measure ? ? Help from another person eating meals?: None ?Help from another person taking care of personal grooming?: A  Little ?Help from another person toileting, which includes using toliet, bedpan, or urinal?: A Little ?Help from another person bathing (including washing, rinsing, drying)?: A Little ?Help from another person to put on and taking off regular upper body clothing?: None ?Help from another person to put on and taking off regular lower body clothing?: A Little ?6 Click Score: 20 ? ?  ?End of Session Equipment Utilized During Treatment: Oxygen ? ?OT Visit Diagnosis: Unsteadiness on feet (R26.81);Other abnormalities of gait and mobility (R26.89) ?  ?Activity Tolerance Patient tolerated treatment well ?  ?Patient Left in chair;with call bell/phone within reach ?  ?Nurse Communication Mobility status ?  ? ?   ? ?Time: 4315-4008 ?OT Time Calculation (min): 27 min ? ?Charges: OT General Charges ?$OT Visit: 1 Visit ?OT Treatments ?$Self Care/Home Management : 23-37 mins ? ?Zachary Hawkins, OTR/L ?Acute Rehabilitation Services ?[phone] ?[phone]  ? ?Zachary Hawkins ?07/20/2021, 3:26 PM ?

## 2021-07-20 NOTE — Progress Notes (Signed)
Pt placed on BIPAP QHS at this time. Pt tolerating well.  ?

## 2021-07-20 NOTE — Progress Notes (Signed)
Mobility Specialist Progress Note: ? ? 07/20/21 1400  ?Mobility  ?Activity Ambulated with assistance in hallway  ?Level of Assistance Modified independent, requires aide device or extra time  ?Assistive Device Front wheel walker  ?Distance Ambulated (ft) 340 ft  ?Activity Response Tolerated well  ?$Mobility charge 1 Mobility  ? ?Pt eager for mobility this afternoon. No physical assist required. VSS on 4LO2. Pt back in chair with all needs met.  ? ?Nelta Numbers ?Acute Rehab ?Phone: [phone] ?Office Phone: [phone] ? ?

## 2021-07-20 NOTE — Plan of Care (Signed)
  Problem: Education: Goal: Knowledge of General Education information will improve Description: Including pain rating scale, medication(s)/side effects and non-pharmacologic comfort measures Outcome: Progressing   Problem: Clinical Measurements: Goal: Ability to maintain clinical measurements within normal limits will improve Outcome: Progressing Goal: Will remain free from infection Outcome: Progressing   

## 2021-07-21 DIAGNOSIS — N179 Acute kidney failure, unspecified: Secondary | ICD-10-CM | POA: Diagnosis not present

## 2021-07-21 DIAGNOSIS — I5033 Acute on chronic diastolic (congestive) heart failure: Secondary | ICD-10-CM | POA: Diagnosis not present

## 2021-07-21 DIAGNOSIS — I480 Paroxysmal atrial fibrillation: Secondary | ICD-10-CM | POA: Diagnosis not present

## 2021-07-21 DIAGNOSIS — J441 Chronic obstructive pulmonary disease with (acute) exacerbation: Secondary | ICD-10-CM | POA: Diagnosis not present

## 2021-07-21 LAB — BASIC METABOLIC PANEL
Anion gap: 6 (ref 5–15)
BUN: 28 mg/dL — ABNORMAL HIGH (ref 8–23)
CO2: 40 mmol/L — ABNORMAL HIGH (ref 22–32)
Calcium: 8.7 mg/dL — ABNORMAL LOW (ref 8.9–10.3)
Chloride: 93 mmol/L — ABNORMAL LOW (ref 98–111)
Creatinine, Ser: 1.42 mg/dL — ABNORMAL HIGH (ref 0.61–1.24)
GFR, Estimated: 56 mL/min — ABNORMAL LOW (ref >60)
Glucose, Bld: 177 mg/dL — ABNORMAL HIGH (ref 70–99)
Potassium: 3.9 mmol/L (ref 3.5–5.1)
Sodium: 139 mmol/L (ref 135–145)

## 2021-07-21 LAB — MAGNESIUM: Magnesium: 2.5 mg/dL — ABNORMAL HIGH (ref 1.7–2.4)

## 2021-07-21 MED ORDER — DAPAGLIFLOZIN PROPANEDIOL 10 MG PO TABS
10.0000 mg | ORAL_TABLET | Freq: Every day | ORAL | Status: DC
  Administered 2021-07-21 – 2021-07-23 (×3): 10 mg via ORAL
  Filled 2021-07-21 (×3): qty 1

## 2021-07-21 MED ORDER — IPRATROPIUM-ALBUTEROL 0.5-2.5 (3) MG/3ML IN SOLN
3.0000 mL | Freq: Two times a day (BID) | RESPIRATORY_TRACT | Status: DC
  Administered 2021-07-21 – 2021-07-23 (×5): 3 mL via RESPIRATORY_TRACT
  Filled 2021-07-21 (×5): qty 3

## 2021-07-21 MED ORDER — FUROSEMIDE 10 MG/ML IJ SOLN
80.0000 mg | Freq: Two times a day (BID) | INTRAMUSCULAR | Status: DC
  Administered 2021-07-21 – 2021-07-22 (×3): 80 mg via INTRAVENOUS
  Filled 2021-07-21 (×3): qty 8

## 2021-07-21 NOTE — Progress Notes (Signed)
?Progress Note ? ? ?Patient: Zachary Hawkins [id number] DOB: 1955-10-18 DOA: 07/17/2021     4 ?DOS: the patient was seen and examined on 07/21/2021 ?  ?Brief hospital course: ?Mr. Mcdonnel was admitted to the hospital with the working diagnosis of decompensated heart failure, acute on chronic hypoxemic and hypercapnic respiratory failure.  ? ?66 yo male with the past medical history of COPD with chronic hypoxemic respiratory failure, diastolic heart failure, hypertension, paroxysmal fibrillation, chronic kidney disease and obesity class 3 who presented with dyspnea. Reported 3 to 4 days of progressive dyspnea, associated with orthopnea and lower extremity edema. 10 lbs weight gain in last 7 days. On his initial physical examination his blood pressure was 126/95, HR 94 to 100, RR 19 to 27, 02 saturation 91 to 93 on 5L min per [place]. Heart with S1 and S2 present and rhythmic, lungs with no wheezing, abdomen protuberant and positive lower extremity edema.  ? ?Na 138, K 4,3 CL 92, bicarbonate 37, glucose 146, bun 20, cr 1,50 ?VBG 7.36 Pc02 60, bicarbonate 46  ?Wbc 7.6 hgb 13.2 hct 43 plt 224  ? ?Sars covid 19 negative  ? ?Chest radiograph with hypoinflation and bilateral atelectasis at bases.  ? ?EKG 94 bpm, normal axis, normal intervals, sinus rhythm, with no significant ST segment or T wave changes.  ? ?Patient was placed on diuresis and non invasive mechanical ventilation with improvement on pH and respiratory failure.  ? ?Volume status improving and patient has been wean off to nasal cannula with good toleration.  ?At home is on 4 to 5 L of supplemental 02 per [place]  ? ?Volume status has been improving, but not yet back to baseline.  ?Nutrition has been consulted for heart failure teaching, at home patient has not been compliant with heart healthy diet.  ? ?Assessment and Plan: ?* Acute on chronic diastolic heart failure ([place]) ?Acute on chronic hypoxemic and hypercapnic respiratory failure.  ? ?Echocardiogram from 04/2021  with preserved LV EF 60 to 65% with preserved RV systolic function. No significant valvular disease. ? ?Urine output over last 24 hrs is 1,257ml  ?His po intake is documented at 1,320 ml  ?Today with positive oxygen desaturation down to 89 and 88% on 5 L/min per [place]  ? ? ?Plan to resume diuresis with IV furosemide 80 mg IV q12 hrs ?Continue with spironolactone and will add dapagliflozin.  ?At home patient on torsemide 100 mg in am and 50 mg pm.  ?Continue to target further negative fluid balance.  ?Further fluid restriction to 1000 ml per day.  ? ? ? ?COPD with acute exacerbation ([place]) ?Continue with bronchodilator therapy and oxymetry monitoring. ?COPD exacerbation has been ruled out.   ? ?Paroxysmal atrial fibrillation ([place]) ?Rate control with bisoprolol, continue anticoagulation with apixaban.  ?Continue telemetry monitoring.  ?  ? ?Acute kidney injury superimposed on chronic kidney disease ([place]) ?CKD stage 3a  ? ?Volume status has improved but not yet back to baseline. ?Renal function with serum cr at 1,4 with K at 3,9 and serum bicarbonate at 40. ?Plan to continue aggressive diuresis with furosemide to target further negative fluid balance.    ? ?Essential hypertension ?Continue blood pressure control with amlodipine ?Continue diuresis. ?Close blood pressure monitoring systolic has been 90 to 123456 mmHg.  ? ?OSA (obstructive sleep apnea) ?Continue with Cpap.  ? ?BPH (benign prostatic hyperplasia) ?No signs of urinary retention. ? ? ?Hyperlipidemia ?Continue with atorvastatin  ?  ?  ? ?Class 3 obesity (HCC) ?Calculated  BMI is 45.2  ? ? ? ? ?  ? ?Subjective: patient with improvement in edema, but not yet back to baseline, no nausea or vomiting  ? ?Physical Exam: ?Vitals:  ? 07/21/21 0403 07/21/21 0405 07/21/21 0747 07/21/21 0958  ?BP:  (!) 148/97    ?Pulse:  76    ?Resp:  18    ?Temp:  98.4 ?F (36.9 ?C)    ?TempSrc:  Oral    ?SpO2:  94% 93% 94%  ?Weight: (!) 155.1 kg     ?Height:      ? ?Neurology awake and  alert ?ENT with no pallor ?Cardiovascular with S1 and S2 present and rhythmic with no gallops or murmurs, no rubs ?No JVD (wide neck) ?Positive lower extremity edema + ?Respiratory with scattered rales at bases, no wheezing ?Abdomen protuberant but not distended  ?Data Reviewed: ? ? ? ?Family Communication: no family at the bedside  ? ?Disposition: ?Status is: Inpatient ?Remains inpatient appropriate because: heart failure and volume overload  ? Planned Discharge Destination: Home ? ? ? ?Author: ?Tawni Millers, MD ?07/21/2021 1:50 PM ? ?For on call review [email or web address].  ?

## 2021-07-21 NOTE — Progress Notes (Signed)
Mobility Specialist Progress Note: ? ? 07/21/21 1615  ?Mobility  ?Activity Ambulated with assistance in hallway  ?Level of Assistance Standby assist, set-up cues, supervision of patient - no hands on  ?Assistive Device None  ?Distance Ambulated (ft) 340 ft  ?Activity Response Tolerated well  ?$Mobility charge 1 Mobility  ? ?Pt agreed to second mobility session this am. Ambulated with no AD this session, pt with no overt LOB. Left in chair with all needs met.  ? ?Nelta Numbers ?Acute Rehab ?Phone: [phone] ?Office Phone: [phone] ? ?

## 2021-07-21 NOTE — Progress Notes (Signed)
Physical Therapy Treatment ?Patient Details ?Name: Zachary Hawkins ?MRN: [id number] ?DOB: 1956-05-03 ?Today's Date: 07/21/2021 ? ? ?History of Present Illness 66 yo male admitted 3/10 with dyspnea. 3 recent admissions for similar respiratory symptoms. PMhx: COPD with chronic respiratory failure, dCHF, HTN, paroxysmal Afib, CKD, obesity ? ?  ?PT Comments  ? ? Pt pleasant and initially confused thinking he was dreaming. Pt quick to return to normal cognition and able to progress gait distance and perform stairs on 6L with sats >94% with slight desaturation to 90% end of gait with cues for pursed lip breathing, slowed gait speed and energy conservation. Pt appropriate for return home and encouraged continued mobility acutely.  ? ?HR 94   ?Recommendations for follow up therapy are one component of a multi-disciplinary discharge planning process, led by the attending physician.  Recommendations may be updated based on patient status, additional functional criteria and insurance authorization. ? ?Follow Up Recommendations ? Home health PT ?  ?  ?Assistance Recommended at Discharge PRN  ?Patient can return home with the following A little help with bathing/dressing/bathroom;Assistance with cooking/housework;Assist for transportation ?  ?Equipment Recommendations ? Rollator (4 wheels)  ?  ?Recommendations for Other Services   ? ? ?  ?Precautions / Restrictions Precautions ?Precautions: Other (comment) ?Precaution Comments: watch O2 ?Restrictions ?Weight Bearing Restrictions: No  ?  ? ?Mobility ? Bed Mobility ?Overal bed mobility: Modified Independent ?  ?  ?  ?  ?  ?  ?  ?  ? ?Transfers ?Overall transfer level: Modified independent ?  ?  ?  ?  ?  ?  ?  ?  ?  ?  ? ?Ambulation/Gait ?Ambulation/Gait assistance: Supervision ?Gait Distance (Feet): 300 Feet ?Assistive device: Rolling walker (2 wheels) ?Gait Pattern/deviations: Step-through pattern, Decreased stride length, Wide base of support ?  ?Gait velocity interpretation: >4.37  ft/sec, indicative of normal walking speed ?  ?General Gait Details: with and without RW for gait steady throughout ? ? ?Stairs ?Stairs: Yes ?Stairs assistance: Modified independent (Device/Increase time) ?Stair Management: Alternating pattern, Forwards, One rail Left ?Number of Stairs: 3 ?General stair comments: pt reports he uses wall for hand placement and encouraged to have rail installed at home ? ? ?Wheelchair Mobility ?  ? ?Modified Rankin (Stroke Patients Only) ?  ? ? ?  ?Balance Overall balance assessment: Mild deficits observed, not formally tested ?  ?  ?  ?  ?  ?  ?  ?  ?  ?  ?  ?  ?  ?  ?  ?  ?  ?  ?  ? ?  ?Cognition Arousal/Alertness: Awake/alert ?Behavior During Therapy: [facility] for tasks assessed/performed ?Overall Cognitive Status: Within Functional Limits for tasks assessed ?  ?  ?  ?  ?  ?  ?  ?  ?  ?  ?  ?  ?  ?  ?  ?  ?  ?  ?  ? ?  ?Exercises   ? ?  ?General Comments   ?  ?  ? ?Pertinent Vitals/Pain Pain Assessment ?Pain Assessment: No/denies pain  ? ? ?Home Living   ?  ?  ?  ?  ?  ?  ?  ?  ?  ?   ?  ?Prior Function    ?  ?  ?   ? ?PT Goals (current goals can now be found in the care plan section) Progress towards PT goals: Progressing toward goals ? ?  ?Frequency ? ? ?  Min 3X/week ? ? ? ?  ?PT Plan Current plan remains appropriate  ? ? ?Co-evaluation   ?  ?  ?  ?  ? ?  ?AM-PAC PT "6 Clicks" Mobility   ?Outcome Measure ? Help needed turning from your back to your side while in a flat bed without using bedrails?: None ?Help needed moving from lying on your back to sitting on the side of a flat bed without using bedrails?: None ?Help needed moving to and from a bed to a chair (including a wheelchair)?: None ?Help needed standing up from a chair using your arms (e.g., wheelchair or bedside chair)?: A Little ?Help needed to walk in hospital room?: A Little ?Help needed climbing 3-5 steps with a railing? : A Little ?6 Click Score: 21 ? ?  ?End of Session Equipment Utilized During Treatment:  Oxygen ?Activity Tolerance: Patient tolerated treatment well ?Patient left: in chair;with call bell/phone within reach ?Nurse Communication: Mobility status ?PT Visit Diagnosis: Other abnormalities of gait and mobility (R26.89) ?  ? ? ?Time: [phone] ?PT Time Calculation (min) (ACUTE ONLY): 27 min ? ?Charges:  $Gait Training: 8-22 mins ?$Therapeutic Activity: 8-22 mins          ?          ? ?Zachary Hawkins, PT ?Acute Rehabilitation Services ?Pager: [phone] ?Office: [phone] ? ? ? ?Zachary Hawkins ?07/21/2021, 10:01 AM ? ?

## 2021-07-21 NOTE — Progress Notes (Signed)
Mobility Specialist Progress Note: ? ? 07/21/21 1100  ?Mobility  ?Activity Ambulated with assistance in hallway  ?Level of Assistance Modified independent, requires aide device or extra time  ?Assistive Device Front wheel walker  ?Distance Ambulated (ft) 340 ft  ?Activity Response Tolerated well  ?$Mobility charge 1 Mobility  ? ?Pt eager for mobility session. No physical assist required. Pt back in chair with all needs met.  ? ?Nelta Numbers ?Acute Rehab ?Phone: [phone] ?Office Phone: [phone] ? ?

## 2021-07-22 ENCOUNTER — Telehealth: Payer: Self-pay | Admitting: Pharmacy Technician

## 2021-07-22 ENCOUNTER — Other Ambulatory Visit (HOSPITAL_COMMUNITY): Payer: Self-pay

## 2021-07-22 DIAGNOSIS — I5033 Acute on chronic diastolic (congestive) heart failure: Secondary | ICD-10-CM | POA: Diagnosis not present

## 2021-07-22 LAB — BASIC METABOLIC PANEL
Anion gap: 10 (ref 5–15)
BUN: 25 mg/dL — ABNORMAL HIGH (ref 8–23)
CO2: 40 mmol/L — ABNORMAL HIGH (ref 22–32)
Calcium: 8.7 mg/dL — ABNORMAL LOW (ref 8.9–10.3)
Chloride: 90 mmol/L — ABNORMAL LOW (ref 98–111)
Creatinine, Ser: 1.4 mg/dL — ABNORMAL HIGH (ref 0.61–1.24)
GFR, Estimated: 56 mL/min — ABNORMAL LOW (ref >60)
Glucose, Bld: 120 mg/dL — ABNORMAL HIGH (ref 70–99)
Potassium: 4.1 mmol/L (ref 3.5–5.1)
Sodium: 140 mmol/L (ref 135–145)

## 2021-07-22 MED ORDER — TORSEMIDE 20 MG PO TABS
60.0000 mg | ORAL_TABLET | Freq: Every day | ORAL | Status: DC
  Administered 2021-07-22: 60 mg via ORAL
  Filled 2021-07-22: qty 3

## 2021-07-22 NOTE — Progress Notes (Signed)
Physical Therapy Treatment ?Patient Details ?Name: Zachary Hawkins ?MRN: [id number] ?DOB: 11-26-1955 ?Today's Date: 07/22/2021 ? ? ?History of Present Illness 66 yo male admitted 3/10 with dyspnea. 3 recent admissions for similar respiratory symptoms. PMhx: COPD with chronic respiratory failure, dCHF, HTN, paroxysmal Afib, CKD, obesity ? ?  ?PT Comments  ? ? Pt was seen for progressing of movement and cues were given for control of walker and breath.  Pt is mildly SOB in mask but was off O2 when PT arrived in the room.  Replaced and walked with sats maintained with one brief moment of having to increase rest to stand due to 89% reading.  Pt is able to feel and control breathing but was distracted with his concerns about how his care is going.  Encouraged him to ask everyone who comes to room for things he needs and not to wait for just certain people.  Follow up with him for progressing gait and to increase independence with his mobility.  Recommend HHPT for management of strength and his O2 sat fluctuations.   ?Recommendations for follow up therapy are one component of a multi-disciplinary discharge planning process, led by the attending physician.  Recommendations may be updated based on patient status, additional functional criteria and insurance authorization. ? ?Follow Up Recommendations ? Home health PT ?  ?  ?Assistance Recommended at Discharge PRN  ?Patient can return home with the following A little help with bathing/dressing/bathroom;Assistance with cooking/housework;Assist for transportation ?  ?Equipment Recommendations ? Rollator (4 wheels)  ?  ?Recommendations for Other Services   ? ? ?  ?Precautions / Restrictions Precautions ?Precautions: Other (comment) ?Precaution Comments: watch O2 ?Restrictions ?Weight Bearing Restrictions: No ?Other Position/Activity Restrictions: monitor HR  ?  ? ?Mobility ? Bed Mobility ?  ?  ?  ?  ?  ?  ?  ?General bed mobility comments: up in chair ?  ? ?Transfers ?Overall  transfer level: Modified independent ?Equipment used: None ?Transfers: Sit to/from Stand ?  ?  ?  ?  ?  ?  ?  ?  ? ?Ambulation/Gait ?Ambulation/Gait assistance: Supervision ?Gait Distance (Feet): 300 Feet ?Assistive device: Rolling walker (2 wheels) ?Gait Pattern/deviations: Step-through pattern, Decreased stride length ?Gait velocity: decreased ?Gait velocity interpretation: <1.31 ft/sec, indicative of household ambulator ?  ?General Gait Details: used RW and O2 since pt was up in chair and fatigued, and became SOB with the effort ? ? ?Stairs ?  ?  ?  ?  ?  ? ? ?Wheelchair Mobility ?  ? ?Modified Rankin (Stroke Patients Only) ?  ? ? ?  ?Balance Overall balance assessment: Mild deficits observed, not formally tested ?Sitting-balance support: Feet supported ?Sitting balance-Leahy Scale: Good ?  ?  ?Standing balance support: Bilateral upper extremity supported ?Standing balance-Leahy Scale: Good ?  ?  ?  ?  ?  ?  ?  ?  ?  ?  ?  ?  ?  ? ?  ?Cognition Arousal/Alertness: Awake/alert ?Behavior During Therapy: [facility] for tasks assessed/performed ?Overall Cognitive Status: Within Functional Limits for tasks assessed ?  ?  ?  ?  ?  ?  ?  ?  ?  ?  ?  ?  ?  ?  ?  ?  ?General Comments: redirection for conversation to his care ?  ?  ? ?  ?Exercises   ? ?  ?General Comments General comments (skin integrity, edema, etc.): pt is up to walk wiht O2 sats monitored the entire walk, able  to manage with 3L and maintain O2 sats but did stop once to ck with pulse ox and found 89% ?  ?  ? ?Pertinent Vitals/Pain Pain Assessment ?Pain Assessment: No/denies pain  ? ? ?Home Living   ?  ?  ?  ?  ?  ?  ?  ?  ?  ?   ?  ?Prior Function    ?  ?  ?   ? ?PT Goals (current goals can now be found in the care plan section) Acute Rehab PT Goals ?Patient Stated Goal: go home and feel better ?Progress towards PT goals: Progressing toward goals ? ?  ?Frequency ? ? ? Min 3X/week ? ? ? ?  ?PT Plan Current plan remains appropriate  ? ? ?Co-evaluation   ?  ?  ?  ?   ? ?  ?AM-PAC PT "6 Clicks" Mobility   ?Outcome Measure ? Help needed turning from your back to your side while in a flat bed without using bedrails?: None ?Help needed moving from lying on your back to sitting on the side of a flat bed without using bedrails?: None ?Help needed moving to and from a bed to a chair (including a wheelchair)?: None ?Help needed standing up from a chair using your arms (e.g., wheelchair or bedside chair)?: A Little ?Help needed to walk in hospital room?: A Little ?Help needed climbing 3-5 steps with a railing? : A Little ?6 Click Score: 21 ? ?  ?End of Session Equipment Utilized During Treatment: Oxygen ?Activity Tolerance: Patient tolerated treatment well ?Patient left: in chair;with call bell/phone within reach;with chair alarm set ?Nurse Communication: Mobility status ?PT Visit Diagnosis: Other abnormalities of gait and mobility (R26.89) ?  ? ? ?Time: [id number] ?PT Time Calculation (min) (ACUTE ONLY): 38 min ? ?Charges:  $Gait Training: 8-22 mins ?$Therapeutic Activity: 23-37 mins  ?Ramond Dial ?07/22/2021, 6:52 PM ? ?Mee Hives, PT PhD ?Acute Rehab Dept. Number: [facility] [id number] and [place] [phone] ? ? ?

## 2021-07-22 NOTE — Progress Notes (Signed)
Mobility Specialist Progress Note: ? ? 07/22/21 1130  ?Mobility  ?Activity Ambulated with assistance in hallway  ?Level of Assistance Standby assist, set-up cues, supervision of patient - no hands on  ?Assistive Device None  ?Distance Ambulated (ft) 500 ft  ?Activity Response Tolerated well  ?$Mobility charge 1 Mobility  ? ?Pt eager for mobility session. SpO2 93-95% on 4LO2 throughout session. Pt back in bed with all needs met.  ? ?Nelta Numbers ?Acute Rehab ?Phone: [phone] ?Office Phone: [phone] ? ?

## 2021-07-22 NOTE — Progress Notes (Signed)
Placed patient back on BiPAP at this time. Found patient on 5 Lpm [place] at 0420 for a quick drink. Waited 45 minutes and placed patient back on BiPAP.  ?

## 2021-07-22 NOTE — TOC Benefit Eligibility Note (Signed)
Patient Advocate Encounter ? ?Prior Authorization for Farxiga 10 mg has been approved.   ? ?PA# [id number] ?Effective dates: 07/22/2021 through 07/22/2022 ? ?Patients co-pay is $4.00.  ? ? ? ?Roland Earl, CPhT ?Pharmacy Patient Advocate Specialist ?[facility] Patient Advocate Team ?Direct Number: [phone]  Fax: [phone]  ?

## 2021-07-22 NOTE — TOC Benefit Eligibility Note (Signed)
Patient Advocate Encounter ? ?Insurance verification completed.   ? ?The patient is currently admitted and upon discharge could be taking Farxiga 10 mg. ? ?Requires Prior Authorization.  ? ?The patient is insured through [employer] [place]  ? ? ? ?Roland Earl, CPhT ?Pharmacy Patient Advocate Specialist ?[facility] Patient Advocate Team ?Direct Number: [phone]  Fax: [phone] ? ? ? ? ? ?  ?

## 2021-07-22 NOTE — Progress Notes (Signed)
PROGRESS NOTE    Zachary Hawkins  [id number] DOB: [birth year] DOA: 07/17/2021 PCP: Sebastian Ache, PA-C  Narrative: Morbidly obese chronically ill 65/M with history of COPD, chronic hypoxic respiratory failure on 4 to 5 L home O2, OSA, chronic diastolic CHF, PAH, paroxysmal A-fib, CKD 3a, presented to the ED after his home health RN noted worsening hypoxia, he also reported progressive dyspnea, edema and some orthopnea over the past 4 days. -In the ED he was found to be edematous, chest x-ray noted atelectasis   Subjective:  Assessment and Plan:  Acute on chronic diastolic heart failure (HCC) Acute on chronic hypoxemic and hypercapnic respiratory failure.  -Echo-04/2021 with LV EF 60 to 65% with preserved RV systolic function -On 5 L O2 at baseline -Improving on IV Lasix, he is 7.8 L negative -Appears close to euvolemic, he is adamant to discharge today but willing to stay till tomorrow -Transition to torsemide this evening -At baseline he is on 100 mg every morning and 50 mg every afternoon -Continue Aldactone and started on dapagliflozin -Dietitian consult -Dietary compliance remains a barrier  COPD  (HCC) Chronic respiratory failure -Continue home O2, bronchodilatorsPRN  Paroxysmal atrial fibrillation (HCC) -In sinus rhythm now, continue bisoprolol and apixaban  Acute kidney injury superimposed on chronic kidney disease (HCC) CKD stage 3a  -Stable, monitor on diuretics  Essential hypertension -BP soft, asymptomatic, diuretics as above, discontinue amlodipine  OSA (obstructive sleep apnea) Continue with Cpap.   BPH (benign prostatic hyperplasia) No signs of urinary retention.   Hyperlipidemia Continue with atorvastatin    Morbid obesity  -calculated BMI is 45.2  -Recommended weight loss, lifestyle modification  DVT prophylaxis: Apixaban Code Status: Full code Family Communication: Updated daughter from patient's room Disposition Plan: Home likely  tomorrow   Procedures:   Antimicrobials:    Objective: Vitals:   07/22/21 0015 07/22/21 0356 07/22/21 0503 07/22/21 0755  BP:  102/80    Pulse: 79 85 81   Resp: (!) 23 18 20    Temp:  98.2 F (36.8 C)    TempSrc:  Oral    SpO2: 97% 95% 96% 96%  Weight:  (!) 153.9 kg    Height:        Intake/Output Summary (Last 24 hours) at 07/22/2021 1120 Last data filed at 07/22/2021 1051 Gross per 24 hour  Intake 1200 ml  Output 4675 ml  Net -3475 ml   Filed Weights   07/20/21 0424 07/21/21 0403 07/22/21 0356  Weight: (!) 155.1 kg (!) 155.1 kg (!) 153.9 kg    Examination:  General exam: Obese chronically ill male with sitting up in bed, AAOx3, no distress HEENT: Neck obese unable to assess JVD CVS: S1-S2, regular rate rhythm Lungs: Decreased breath sounds to bases otherwise clear, distant breath sounds Abdomen: Soft, obese, nontender, bowel sounds present Extremities: 1+ edema  Skin: Chronic venous stasis and hyperpigmentation changes Psychiatry: Judgement and insight appear normal. Mood & affect appropriate.     Data Reviewed:   CBC: Recent Labs  Lab 07/17/21 1738 07/17/21 1802 07/18/21 0117  WBC 7.6  --  7.9  NEUTROABS 4.2  --  6.3  HGB 13.2 14.6  14.6 14.1  HCT 43.4 43.0  43.0 46.8  MCV 96.9  --  95.7  PLT 224  --  229   Basic Metabolic Panel: Recent Labs  Lab 07/17/21 2231 07/18/21 0117 07/19/21 0344 07/20/21 0450 07/21/21 0732 07/22/21 0331  NA  --  140 140 138 139 140  K  --  4.9 4.7 3.6 3.9 4.1  CL  --  95* 92* 91* 93* 90*  CO2  --  32 39* 39* 40* 40*  GLUCOSE  --  140* 149* 111* 177* 120*  BUN  --  21 27* 33* 28* 25*  CREATININE  --  1.51* 1.51* 1.60* 1.42* 1.40*  CALCIUM  --  9.0 8.8* 8.5* 8.7* 8.7*  MG 2.1 2.1  --  2.3 2.5*  --   PHOS  --  2.5  --   --   --   --    GFR: Estimated Creatinine Clearance: 83.6 mL/min (A) (by C-G formula based on SCr of 1.4 mg/dL (H)). Liver Function Tests: Recent Labs  Lab 07/17/21 1738 07/18/21 0117   AST 18 21  ALT 17 20  ALKPHOS 58 61  BILITOT 0.6 0.5  PROT 5.9* 6.8  ALBUMIN 3.1* 3.5   No results for input(s): LIPASE, AMYLASE in the last 168 hours. No results for input(s): AMMONIA in the last 168 hours. Coagulation Profile: No results for input(s): INR, PROTIME in the last 168 hours. Cardiac Enzymes: No results for input(s): CKTOTAL, CKMB, CKMBINDEX, TROPONINI in the last 168 hours. BNP (last 3 results) No results for input(s): PROBNP in the last 8760 hours. HbA1C: No results for input(s): HGBA1C in the last 72 hours. CBG: No results for input(s): GLUCAP in the last 168 hours. Lipid Profile: No results for input(s): CHOL, HDL, LDLCALC, TRIG, CHOLHDL, LDLDIRECT in the last 72 hours. Thyroid Function Tests: No results for input(s): TSH, T4TOTAL, FREET4, T3FREE, THYROIDAB in the last 72 hours. Anemia Panel: No results for input(s): VITAMINB12, FOLATE, FERRITIN, TIBC, IRON, RETICCTPCT in the last 72 hours. Urine analysis:    Component Value Date/Time   COLORURINE STRAW (A) 04/29/2021 1008   APPEARANCEUR CLEAR 04/29/2021 1008   LABSPEC 1.009 04/29/2021 1008   PHURINE 5.0 04/29/2021 1008   GLUCOSEU NEGATIVE 04/29/2021 1008   HGBUR SMALL (A) 04/29/2021 1008   BILIRUBINUR NEGATIVE 04/29/2021 1008   KETONESUR NEGATIVE 04/29/2021 1008   PROTEINUR NEGATIVE 04/29/2021 1008   NITRITE NEGATIVE 04/29/2021 1008   LEUKOCYTESUR NEGATIVE 04/29/2021 1008   Sepsis Labs: @LABRCNTIP (procalcitonin:4,lacticidven:4)  ) Recent Results (from the past 240 hour(s))  Resp Panel by RT-PCR (Flu A&B, Covid) Nasopharyngeal Swab     Status: None   Collection Time: 07/17/21  5:43 PM   Specimen: Nasopharyngeal Swab; Nasopharyngeal(NP) swabs in vial transport medium  Result Value Ref Range Status   SARS Coronavirus 2 by RT PCR NEGATIVE NEGATIVE Final    Comment: (NOTE) SARS-CoV-2 target nucleic acids are NOT DETECTED.  The SARS-CoV-2 RNA is generally detectable in upper respiratory specimens  during the acute phase of infection. The lowest concentration of SARS-CoV-2 viral copies this assay can detect is 138 copies/mL. A negative result does not preclude SARS-Cov-2 infection and should not be used as the sole basis for treatment or other patient management decisions. A negative result may occur with  improper specimen collection/handling, submission of specimen other than nasopharyngeal swab, presence of viral mutation(s) within the areas targeted by this assay, and inadequate number of viral copies(<138 copies/mL). A negative result must be combined with clinical observations, patient history, and epidemiological information. The expected result is Negative.  Fact Sheet for Patients:  BloggerCourse.com  Fact Sheet for Healthcare Providers:  SeriousBroker.it  This test is no t yet approved or cleared by the [place] FDA and  has been authorized for detection and/or diagnosis of SARS-CoV-2 by FDA under an Emergency Use Authorization (  EUA). This EUA will remain  in effect (meaning this test can be used) for the duration of the COVID-19 declaration under Section 564(b)(1) of the Act, 21 U.S.C.section 360bbb-3(b)(1), unless the authorization is terminated  or revoked sooner.       Influenza A by PCR NEGATIVE NEGATIVE Final   Influenza B by PCR NEGATIVE NEGATIVE Final    Comment: (NOTE) The Xpert Xpress SARS-CoV-2/FLU/RSV plus assay is intended as an aid in the diagnosis of influenza from Nasopharyngeal swab specimens and should not be used as a sole basis for treatment. Nasal washings and aspirates are unacceptable for Xpert Xpress SARS-CoV-2/FLU/RSV testing.  Fact Sheet for Patients: BloggerCourse.com  Fact Sheet for Healthcare Providers: SeriousBroker.it  This test is not yet approved or cleared by the [place] FDA and has been authorized for detection  and/or diagnosis of SARS-CoV-2 by FDA under an Emergency Use Authorization (EUA). This EUA will remain in effect (meaning this test can be used) for the duration of the COVID-19 declaration under Section 564(b)(1) of the Act, 21 U.S.C. section 360bbb-3(b)(1), unless the authorization is terminated or revoked.  Performed at [facility], [address] [place], [place] [address]   MRSA Next Gen by PCR, Nasal     Status: Abnormal   Collection Time: 07/17/21  9:44 PM   Specimen: Nasal Mucosa; Nasal Swab  Result Value Ref Range Status   MRSA by PCR Next Gen DETECTED (A) NOT DETECTED Final    Comment: RESULT CALLED TO, READ BACK BY AND VERIFIED WITH: RN ANNE LOWE 07/17/21@23 :16 by TW (NOTE) The GeneXpert MRSA Assay (FDA approved for NASAL specimens only), is one component of a comprehensive MRSA colonization surveillance program. It is not intended to diagnose MRSA infection nor to guide or monitor treatment for MRSA infections. Test performance is not FDA approved in patients less than 34 years old. Performed at [facility], [address] [place], [place] [address]      Radiology Studies: No results found.   Scheduled Meds:  amLODipine  5 mg Oral Daily   apixaban  5 mg Oral BID   atorvastatin  10 mg Oral Daily   bisoprolol  5 mg Oral Daily   Chlorhexidine Gluconate Cloth  6 each Topical Q0600   dapagliflozin propanediol  10 mg Oral Daily   famotidine  20 mg Oral BID   fluticasone  1 spray Each Nare Daily   ipratropium-albuterol  3 mL Nebulization BID   spironolactone  25 mg Oral Daily   tamsulosin  0.4 mg Oral QPC supper   torsemide  60 mg Oral Daily   Continuous Infusions:   LOS: 5 days    Time spent:  Zannie Cove, MD [facility]   07/22/2021, 11:20 AM

## 2021-07-22 NOTE — Progress Notes (Signed)
Nutrition Brief Note  ? ?Received a consult for diet education. ? ?RD provided Heart Failure Nutrition Therapy education on 3/13. At that time RD placed a referral for outpatient education.  ?RD followed-up with pt today, pt with no questions regarding education. Pt reports that he plans to continue to read the material and wants to make better changes at home.  ? ? ? ? ?Kirby Crigler RD, LDN ?Clinical Dietitian ?See AMiON for contact information.  ? ?

## 2021-07-22 NOTE — Progress Notes (Signed)
Placed patient on BiPAP at this time.

## 2021-07-22 NOTE — Telephone Encounter (Signed)
Error

## 2021-07-23 ENCOUNTER — Ambulatory Visit: Payer: Medicaid Other | Admitting: Cardiovascular Disease

## 2021-07-23 ENCOUNTER — Other Ambulatory Visit (HOSPITAL_COMMUNITY): Payer: Self-pay

## 2021-07-23 LAB — BASIC METABOLIC PANEL
Anion gap: 9 (ref 5–15)
BUN: 25 mg/dL — ABNORMAL HIGH (ref 8–23)
CO2: 39 mmol/L — ABNORMAL HIGH (ref 22–32)
Calcium: 8.6 mg/dL — ABNORMAL LOW (ref 8.9–10.3)
Chloride: 91 mmol/L — ABNORMAL LOW (ref 98–111)
Creatinine, Ser: 1.54 mg/dL — ABNORMAL HIGH (ref 0.61–1.24)
GFR, Estimated: 50 mL/min — ABNORMAL LOW (ref >60)
Glucose, Bld: 120 mg/dL — ABNORMAL HIGH (ref 70–99)
Potassium: 3.9 mmol/L (ref 3.5–5.1)
Sodium: 139 mmol/L (ref 135–145)

## 2021-07-23 MED ORDER — SALINE SPRAY 0.65 % NA SOLN
1.0000 | NASAL | Status: DC | PRN
  Filled 2021-07-23: qty 44

## 2021-07-23 MED ORDER — DAPAGLIFLOZIN PROPANEDIOL 10 MG PO TABS
10.0000 mg | ORAL_TABLET | Freq: Every day | ORAL | 0 refills | Status: DC
  Filled 2021-07-23: qty 30, 30d supply, fill #0

## 2021-07-23 MED ORDER — TORSEMIDE 100 MG PO TABS
100.0000 mg | ORAL_TABLET | Freq: Every day | ORAL | Status: DC
  Administered 2021-07-23: 100 mg via ORAL
  Filled 2021-07-23: qty 1

## 2021-07-23 NOTE — Discharge Summary (Signed)
Physician Discharge Summary  ?Zachary Hawkins [id number] DOB: 1955/11/20 DOA: 07/17/2021 ? ?PCP: Dorise Hiss, PA-C ? ?Admit date: 07/17/2021 ?Discharge date: 07/23/2021 ? ?Time spent: 51minutes ? ?Recommendations for Outpatient Follow-up:  ?Heart failure clinic in 1 to 2 weeks, please check BMP at follow-up ? ? ?Discharge Diagnoses:  ?Principal Problem: ?  Acute on chronic diastolic heart failure ([place]) ?Active Problems: ?  COPD with acute exacerbation ([place]) ?  Paroxysmal atrial fibrillation (HCC) ?  Acute kidney injury superimposed on chronic kidney disease ([place]) ?  Essential hypertension ?  OSA (obstructive sleep apnea) ?  BPH (benign prostatic hyperplasia) ?  Hyperlipidemia ?  Class 3 obesity (HCC) ? ? ?Discharge Condition: Stable ? ?Diet recommendation: Low-sodium, heart healthy ? ?Filed Weights  ? 07/21/21 0403 07/22/21 0356 07/23/21 0329  ?Weight: (!) 155.1 kg (!) 153.9 kg (!) 153.6 kg  ? ? ?History of present illness:  ?Morbidly obese chronically ill 65/M with history of COPD, chronic hypoxic respiratory failure on 4 to 5 L home O2, OSA, chronic diastolic CHF, PAH, paroxysmal A-fib, CKD 3a, presented to the ED after his home health RN noted worsening hypoxia, he also reported progressive dyspnea, edema and some orthopnea over the past 4 days. ?-In the ED he was found to be edematous, chest x-ray noted atelectasis ?  ? ?Hospital Course:  ? ?Acute on chronic diastolic heart failure ([place]) ?Acute on chronic hypoxemic and hypercapnic respiratory failure.  ?-Echo-04/2021 with LV EF 60 to 65% with preserved RV systolic function ?-On 5 L O2 at baseline ?-Diuresed with IV Lasix, he is 9.4 L negative ?-Appears close to euvolemic, transitioned over to his home regimen of torsemide 100 Mg in AM and 50 Mg every afternoon ?-Continue Aldactone and started on dapagliflozin ?-Dietitian consulted, emphasized importance of salt, fluid restriction ?-Discharged home in a stable condition, follow-up in the heart failure  clinic in 1 to 2 weeks, please check BMP at follow-up ?  ?COPD  ([place]) ?Chronic respiratory failure ?-Continue home O2, bronchodilatorsPRN ?  ?Paroxysmal atrial fibrillation (HCC) ?-In sinus rhythm now, continue bisoprolol and apixaban ?  ?Acute kidney injury superimposed on chronic kidney disease ([place]) ?CKD stage 3a  ?-Stable, monitor on diuretics ?  ?Essential hypertension ?-BP soft, asymptomatic, diuretics as above ?  ?OSA (obstructive sleep apnea) ?Continue with Cpap.  ?  ?BPH (benign prostatic hyperplasia) ?No signs of urinary retention. ?  ?  ?Hyperlipidemia ?Continue with atorvastatin  ?  ?Morbid obesity  ?-calculated BMI is 45.2  ?-Recommended weight loss, lifestyle modification ?  ? ?Discharge Exam: ?Vitals:  ? 07/23/21 0759 07/23/21 0919  ?BP:  135/77  ?Pulse:  80  ?Resp:  19  ?Temp:  97.9 ?F (36.6 ?C)  ?SpO2: 93% 94%  ? ?Obese chronically ill male with sitting up in bed, AAOx3, no distress ?HEENT: Neck obese unable to assess JVD ?CVS: S1-S2, regular rate rhythm ?Lungs: Decreased breath sounds to bases otherwise clear, distant breath sounds ?Abdomen: Soft, obese, nontender, bowel sounds present ?Extremities: Trace edema ?Skin: Chronic venous stasis and hyperpigmentation changes ?Psychiatry: Judgement and insight appear normal. Mood & affect appropriate.  ? ? ?Discharge Instructions ? ? ?Discharge Instructions   ? ? Amb Referral to Nutrition and Diabetic Education   Complete by: As directed ?  ? Diet - low sodium heart healthy   Complete by: As directed ?  ? Increase activity slowly   Complete by: As directed ?  ? ?  ? ?Allergies as of 07/23/2021   ? ?  Reactions  ? Ace Inhibitors Anaphylaxis  ? Black Cherry Fruit Extract Marcelline Mates Extract] Anaphylaxis  ? Tongue  ? [place] Flavor [flavoring Agent] Anaphylaxis  ? ?  ? ?  ?Medication List  ?  ? ?TAKE these medications   ? ?Advair HFA 115-21 MCG/ACT inhaler ?Generic drug: fluticasone-salmeterol ?INHALE 2 PUFFS INTO THE LUNGS 2 (TWO) TIMES DAILY. ?  ?albuterol  108 (90 Base) MCG/ACT inhaler ?Commonly known as: VENTOLIN HFA ?Inhale 2 puffs into the lungs every 4 (four) hours as needed for wheezing or shortness of breath. ?  ?amLODipine 5 MG tablet ?Commonly known as: NORVASC ?Take 1 tablet (5 mg total) by mouth daily. ?  ?apixaban 5 MG Tabs tablet ?Commonly known as: ELIQUIS ?Take 1 tablet (5 mg total) by mouth 2 (two) times daily. ?  ?atorvastatin 10 MG tablet ?Commonly known as: LIPITOR ?Take 10 mg by mouth daily. ?  ?bisoprolol 5 MG tablet ?Commonly known as: ZEBETA ?Take 1 tablet (5 mg total) by mouth daily. ?  ?EPINEPHrine 0.3 mg/0.3 mL Soaj injection ?Commonly known as: EPI-PEN ?Inject 0.3 mg into the muscle as needed for anaphylaxis. ?What changed: when to take this ?  ?famotidine 20 MG tablet ?Commonly known as: PEPCID ?Take 1 tablet (20 mg total) by mouth 2 (two) times daily. ?  ?Farxiga 10 MG Tabs tablet ?Generic drug: dapagliflozin propanediol ?Take 1 tablet (10 mg total) by mouth daily. ?  ?fluticasone 50 MCG/ACT nasal spray ?Commonly known as: FLONASE ?Place 1 spray into both nostrils daily. ?  ?multivitamin with minerals Tabs tablet ?Take 1 tablet by mouth daily. ?  ?potassium chloride SA 20 MEQ tablet ?Commonly known as: KLOR-CON M ?Take 1 tablet (20 mEq total) by mouth 2 (two) times daily. ?  ?saline Gel ?Place 1 application into both nostrils at bedtime. ?  ?spironolactone 25 MG tablet ?Commonly known as: ALDACTONE ?Take 1 tablet (25 mg total) by mouth daily. ?  ?tamsulosin 0.4 MG Caps capsule ?Commonly known as: FLOMAX ?Take 1 capsule (0.4 mg total) by mouth daily after supper. ?  ?torsemide 100 MG tablet ?Commonly known as: DEMADEX ?Take 1 tablet (100 mg total) by mouth in the morning AND 0.5 tablets (50 mg total) every evening. ?What changed: See the new instructions. ?  ? ?  ? ?Allergies  ?Allergen Reactions  ? Ace Inhibitors Anaphylaxis  ? Black Cherry Fruit Extract [Cherry Extract] Anaphylaxis  ?  Tongue  ? [place] Flavor [Flavoring Agent]  Anaphylaxis  ? ? Follow-up Information   ? ? Croitoru, Dani Gobble, MD .   ?Specialty: Cardiology ?Contact information: ?[address] ?[address] ?[place] [address] ?[phone] ? ? ?  ?  ? ? McVey, Gelene Mink, PA-C Follow up.   ?Specialty: Physician Assistant ?Why: Please follow up in a week. ?Contact information: ?[address] ?[place] [address] ?[phone] ? ? ?  ?  ? ?  ?  ? ?  ? ? ? ?The results of significant diagnostics from this hospitalization (including imaging, microbiology, ancillary and laboratory) are listed below for reference.   ? ?Significant Diagnostic Studies: ?CT HEAD WO CONTRAST (5MM) ? ?Result Date: 06/23/2021 ?CLINICAL DATA:  Delirium.  Convulsions and incontinence. EXAM: CT HEAD WITHOUT CONTRAST TECHNIQUE: Contiguous axial images were obtained from the base of the skull through the vertex without intravenous contrast. RADIATION DOSE REDUCTION: This exam was performed according to the departmental dose-optimization program which includes automated exposure control, adjustment of the mA and/or kV according to patient size and/or use of iterative reconstruction  technique. COMPARISON:  None. FINDINGS: Brain: No evidence of acute infarction, hemorrhage, hydrocephalus, extra-axial collection or mass lesion/mass effect. Vascular: No hyperdense vessel or unexpected calcification. Skull: Normal. Negative for fracture or focal lesion. Sinuses/Orbits: Partial opacification of the bilateral maxillary sinus, complete opacification of the ethmoid air cells as well as partial opacification of the sphenoid sinuses. Patchy opacification of the mastoid air cells. Other: None. IMPRESSION: 1.  No acute intracranial abnormality. 2. Severe paranasal sinus disease. No evidence of osseous erosion. Differential includes inflammatory, allergic as well as infectious process including fungal infection. Clinical correlation is suggested. Electronically Signed   By: Keane Police D.O.   On: 06/23/2021 19:29   ? ?DG Chest Port 1 View ? ?Result Date: 07/18/2021 ?CLINICAL DATA:  Acute on chronic diastolic heart failure EXAM: PORTABLE CHEST 1 VIEW COMPARISON:  07/17/2021 FINDINGS: The patient is rotated to the right o

## 2021-07-23 NOTE — Progress Notes (Signed)
Mobility Specialist Progress Note: ? ? 07/23/21 0945  ?Mobility  ?Activity Ambulated independently in hallway  ?Level of Assistance Independent  ?Assistive Device None  ?Distance Ambulated (ft) 300 ft  ?Activity Response Tolerated well  ?$Mobility charge 1 Mobility  ? ?Pt asx during ambulation. Back in chair, eager for d/c. ? ?Nelta Numbers ?Acute Rehab ?Phone: [phone] ?Office Phone: [phone] ? ?

## 2021-07-23 NOTE — Discharge Instructions (Signed)

## 2021-07-27 ENCOUNTER — Ambulatory Visit: Payer: Medicaid Other | Admitting: Gastroenterology

## 2021-07-27 NOTE — Progress Notes (Signed)
? ?Cardiology Clinic Note  ? ?Patient Name: Zachary Hawkins ?Date of Encounter: 08/03/2021 ? ?Primary Care Provider:  Dorise Hiss, PA-C ?Primary Cardiologist:  Sanda Klein, MD ? ?Patient Profile  ?  ?Zachary Hawkins presents to the clinic today for follow-up of his cardiomyopathy and preoperative cardiac evaluation. ? ?Past Medical History  ?  ?Past Medical History:  ?Diagnosis Date  ? Asthma   ? BPH (benign prostatic hyperplasia)   ? Cardiomyopathy ([place]) 06/2017  ? EF 40-45%  ? CHF (congestive heart failure) ([place])   ? Chronic renal insufficiency, stage 3 (moderate) (HCC)   ? COPD (chronic obstructive pulmonary disease) ([place])   ? Diastolic dysfunction [id number]  ? grade 2   ? Hyperlipidemia   ? Hypertension   ? Hypertensive urgency   ? Obesity   ? Obesity   ? OSA (obstructive sleep apnea)   ? Paroxysmal atrial fibrillation ([place]) 2022  ? on Eliquis  ? ?History reviewed. No pertinent surgical history. ? ?Allergies ? ?Allergies  ?Allergen Reactions  ? Ace Inhibitors Anaphylaxis  ? Black Cherry Fruit Extract [Cherry Extract] Anaphylaxis  ?  Tongue  ? [place] Flavor [Flavoring Agent] Anaphylaxis  ? ? ?History of Present Illness  ?  ?Zachary Hawkins has a PMH of chronic diastolic CHF, OSA on CPAP, tobacco use, CKD stage III, HTN, obesity, and paroxysmal atrial fibrillation.  She is also noted to have ongoing compliance issues. ? ?He was seen by Darrick Grinder, NP on 07/15/2021.  During that time he reported that he felt better.  He indicated that he would like help with her diet.  He was noted to have shortness of breath with exertion.  He denied orthopnea and PND.  He had been using his BiPAP.  He indicated that he had been using an electric cart at the grocery store.  His appetite was okay.  He denied fever and chills.  His weight at home was between 340 and 347 pounds.  He reported compliance with his medication.  He was able to drive to his appointments. ? ?He presents to the clinic today for follow-up  evaluation and preoperative cardiac evaluation.  He states he feels that his breathing is better.  His weight has remained stable.  Today is 343 pounds.  He has a home health nurse come out and check his vitals.  He reports that he uses anywhere from 3 to 5 L of oxygen daily depending on his breathing.  His main complaint today is with his dry nose.  We reviewed his Flonase and the option for using saline spray.  I recommended that he use saline spray up to 3 times per day to keep his nares moist.  He would like to become more physically active.  I will give him recommendations for chair type exercises and encouraged him to do them regularly.  We will have him continue to eat a heart healthy low-sodium diet and weigh himself daily.  I plan follow-up for 3 to 4 months. ? ?Today he denies chest pain, shortness of breath, lower extremity edema, fatigue, palpitations, melena, hematuria, hemoptysis, diaphoresis, weakness, presyncope, syncope, orthopnea, and PND. ? ? ?Home Medications  ?  ?Prior to Admission medications   ?Medication Sig Start Date End Date Taking? Authorizing Provider  ?ADVAIR HFA 115-21 MCG/ACT inhaler INHALE 2 PUFFS INTO THE LUNGS 2 (TWO) TIMES DAILY. 07/23/20   Charlott Rakes, MD  ?albuterol (VENTOLIN HFA) 108 (90 Base) MCG/ACT inhaler Inhale 2 puffs into the lungs every 4 (  four) hours as needed for wheezing or shortness of breath. 02/13/21   Croitoru, Mihai, MD  ?amLODipine (NORVASC) 5 MG tablet Take 1 tablet (5 mg total) by mouth daily. 05/04/21 08/15/21  Darliss Cheney, MD  ?apixaban (ELIQUIS) 5 MG TABS tablet Take 1 tablet (5 mg total) by mouth 2 (two) times daily. 05/15/21 11/11/21  Loel Dubonnet, NP  ?atorvastatin (LIPITOR) 10 MG tablet Take 10 mg by mouth daily.    [provider]  ?bisoprolol (ZEBETA) 5 MG tablet Take 1 tablet (5 mg total) by mouth daily. 03/16/21   Pokhrel, Corrie Mckusick, MD  ?dapagliflozin propanediol (FARXIGA) 10 MG TABS tablet Take 1 tablet (10 mg total) by mouth daily.  07/23/21   Domenic Polite, MD  ?EPINEPHrine 0.3 mg/0.3 mL IJ SOAJ injection Inject 0.3 mg into the muscle as needed for anaphylaxis. ?Patient taking differently: Inject 0.3 mg into the muscle once as needed for anaphylaxis. 01/09/21   Bonnielee Haff, MD  ?famotidine (PEPCID) 20 MG tablet Take 1 tablet (20 mg total) by mouth 2 (two) times daily. 01/17/21 01/17/22  Julian Hy, DO  ?fluticasone (FLONASE) 50 MCG/ACT nasal spray Place 1 spray into both nostrils daily. 07/09/21   Maryjane Hurter, MD  ?Multiple Vitamin (MULTIVITAMIN WITH MINERALS) TABS tablet Take 1 tablet by mouth daily. ?Patient not taking: Reported on 07/17/2021 01/10/21   Bonnielee Haff, MD  ?potassium chloride SA (KLOR-CON M) 20 MEQ tablet Take 1 tablet (20 mEq total) by mouth 2 (two) times daily. 05/15/21 11/11/21  Loel Dubonnet, NP  ?saline (AYR) GEL Place 1 application into both nostrils at bedtime. 07/09/21   Maryjane Hurter, MD  ?spironolactone (ALDACTONE) 25 MG tablet Take 1 tablet (25 mg total) by mouth daily. 04/07/21   Croitoru, Mihai, MD  ?tamsulosin (FLOMAX) 0.4 MG CAPS capsule Take 1 capsule (0.4 mg total) by mouth daily after supper. 03/16/21   Pokhrel, Corrie Mckusick, MD  ?torsemide (DEMADEX) 100 MG tablet Take 1 tablet (100 mg total) by mouth in the morning AND 0.5 tablets (50 mg total) every evening. ?Patient taking differently: Take 100 mg daily 07/15/21   Darrick Grinder D, NP  ? ? ?Family History  ?  ?Family History  ?Problem Relation Age of Onset  ? Diabetes Mother   ? Diabetes Father   ? ?He indicated that the status of his mother is unknown. He indicated that the status of his father is unknown. ? ?Social History  ?  ?Social History  ? ?Socioeconomic History  ? Marital status: Single  ?  Spouse name: Not on file  ? Number of children: Not on file  ? Years of education: Not on file  ? Highest education level: Not on file  ?Occupational History  ? Not on file  ?Tobacco Use  ? Smoking status: Former  ?  Packs/day: 1.00  ?  Years: 36.00  ?  Pack  years: 36.00  ?  Types: Cigarettes  ?  Quit date: 01/08/2021  ?  Years since quitting: 0.5  ? Smokeless tobacco: Never  ?Substance and Sexual Activity  ? Alcohol use: Yes  ?  Alcohol/week: 1.0 standard drink  ?  Types: 1 Shots of liquor per week  ?  Comment: socially  ? Drug use: Yes  ?  Types: Marijuana  ?  Comment: every other week  ? Sexual activity: Not on file  ?Other Topics Concern  ? Not on file  ?Social History Narrative  ? Not on file  ? ?Social Determinants of Health  ? ?  Financial Resource Strain: High Risk  ? Difficulty of Paying Living Expenses: Very hard  ?Food Insecurity: No Food Insecurity  ? Worried About Charity fundraiser in the Last Year: Never true  ? Ran Out of Food in the Last Year: Never true  ?Transportation Needs: No Transportation Needs  ? Lack of Transportation (Medical): No  ? Lack of Transportation (Non-Medical): No  ?Physical Activity: Not on file  ?Stress: Not on file  ?Social Connections: Not on file  ?Intimate Partner Violence: Not on file  ?  ? ?Review of Systems  ?  ?General:  No chills, fever, night sweats or weight changes.  ?Cardiovascular:  No chest pain, dyspnea on exertion, edema, orthopnea, palpitations, paroxysmal nocturnal dyspnea. ?Dermatological: No rash, lesions/masses ?Respiratory: No cough, dyspnea ?Urologic: No hematuria, dysuria ?Abdominal:   No nausea, vomiting, diarrhea, bright red blood per rectum, melena, or hematemesis ?Neurologic:  No visual changes, wkns, changes in mental status. ?All other systems reviewed and are otherwise negative except as noted above. ? ?Physical Exam  ?  ?VS:  BP 124/78   Pulse 91   Ht 6\' 1"  (1.854 m)   Wt (!) 343 lb 9.6 oz (155.9 kg)   SpO2 (!) 87%   BMI 45.33 kg/m?  , BMI Body mass index is 45.33 kg/m?. ?GEN: Well nourished, well developed, in no acute distress. ?HEENT: normal. ?Neck: Supple, no JVD, carotid bruits, or masses. ?Cardiac: RRR, no murmurs, rubs, or gallops. No clubbing, cyanosis, generalized bilateral nonpitting  edema.  Radials/DP/PT 2+ and equal bilaterally.  ?Respiratory:  Respirations regular and unlabored, clear to auscultation bilaterally. ?GI: Soft, nontender, nondistended, BS + x 4. ?MS: no deformity or atrophy. ?Skin: war

## 2021-08-03 ENCOUNTER — Encounter: Payer: Self-pay | Admitting: General Practice

## 2021-08-03 ENCOUNTER — Ambulatory Visit: Payer: Medicaid Other | Admitting: General Practice

## 2021-08-03 ENCOUNTER — Other Ambulatory Visit: Payer: Self-pay

## 2021-08-03 VITALS — BP 124/78 | HR 91 | Ht 73.0 in | Wt 343.6 lb

## 2021-08-03 DIAGNOSIS — G4733 Obstructive sleep apnea (adult) (pediatric): Secondary | ICD-10-CM | POA: Diagnosis not present

## 2021-08-03 DIAGNOSIS — I480 Paroxysmal atrial fibrillation: Secondary | ICD-10-CM | POA: Diagnosis not present

## 2021-08-03 DIAGNOSIS — Z01810 Encounter for preprocedural cardiovascular examination: Secondary | ICD-10-CM

## 2021-08-03 DIAGNOSIS — I5032 Chronic diastolic (congestive) heart failure: Secondary | ICD-10-CM | POA: Diagnosis not present

## 2021-08-03 NOTE — Patient Instructions (Signed)
Medication Instructions:  ? ?MAY HOLD ELIQUIS 1 DAY BEFORE YOUR DENTAL EXTRACTIONS. ? ?*If you need a refill on your cardiac medications before your next appointment, please call your pharmacy* ? ?Lab Work:   Testing/Procedures:  ?NONE    NONE ? ?Special Instructions ?PLEASE READ AND FOLLOW SALTY 6-ATTACHED-1,838m daily ? ?PLEASE INCREASE PHYSICAL ACTIVITY AS TOLERATED  ? ?PLEASE READ AND FOLLOW CHAIR EXERCISES-ATTACHED ? ?[place] if you gain 3 pounds in a day -or- 5pounds in a week [phone] ? ?MAY USE SALINE NASAL SPRAY THREE TIMES A DAYS AS NEEDED. ? ?Follow-Up: ?Your next appointment:  3-4 month(s) In Person with MSanda Klein MD  1 ? ?[facility] and your [facility] needs are our [facility].  As part of our continuing mission to provide you with exceptional heart care, we have created designated Provider Care Teams.  These Care Teams include your primary Cardiologist (physician) and Advanced Practice Providers (APPs -  Physician Assistants and Nurse Practitioners) who all work together to provide you with the care you need, when you need it. ? ?We recommend signing up for the patient portal called "MyChart".  Sign up information is provided on this After Visit Summary.  MyChart is used to connect with patients for Virtual Visits (Telemedicine).  Patients are able to view lab/test results, encounter notes, upcoming appointments, etc.  Non-urgent messages can be sent to your provider as well.   ?To learn more about what you can do with MyChart, go to [email or web address]   ? ? ? ?        6 SALTY THINGS TO AVOID     1,800MG DAILY ? ? ? ? ?Exercises to do While Sitting ?Exercises that you do while sitting (chair exercises) can give you many of the same benefits as full exercise. Benefits include strengthening your heart, burning calories, and keeping muscles and joints healthy. Exercise can also improve your mood and help with depression and anxiety. ?You may benefit from  chair exercises if you are unable to do standing exercises due to: ?Diabetic foot pain. ?Obesity. ?Illness. ?Arthritis. ?Recovery from surgery or injury. ?Breathing problems. ?Balance problems. ?Another type of disability. ?Before starting chair exercises, check with your health care provider or a physical therapist to find out how much exercise you can tolerate and which exercises are safe for you. If your health care provider approves: ?Start out slowly and build up over time. Aim to work up to about 10-20 minutes for each exercise session. ?Make exercise part of your daily routine. ?Drink water when you exercise. Do not wait until you are thirsty. Drink every 10-15 minutes. ?Stop exercising right away if you have pain, nausea, shortness of breath, or dizziness. ?If you are exercising in a wheelchair, make sure to lock the wheels. ?Ask your health care provider whether you can do tai chi or yoga. Many positions in these mind-body exercises can be modified to do while seated. ?Warm-up ?Before starting other exercises: ?Sit up as straight as you can. Have your knees bent at 90 degrees, which is the shape of the capital letter "L." Keep your feet flat on the floor. ?Sit at the front edge of your chair, if you can. ?Pull in (tighten) the muscles in your abdomen and stretch your spine and neck as straight as you can. Hold this position for a few minutes. ?Breathe in and out evenly. Try to concentrate on your breathing, and relax your mind. ?Stretching ?Exercise A: Arm stretch ?Hold your arms out straight  in front of your body. ?Bend your hands at the wrist with your fingers pointing up, as if signaling someone to stop. Notice the slight tension in your forearms as you hold the position. ?Keeping your arms out and your hands bent, rotate your hands outward as far as you can and hold this stretch. Aim to have your thumbs pointing up and your pinkie fingers pointing down. ?Slowly repeat arm stretches for one minute as  tolerated. ?Exercise B: Leg stretch ?If you can move your legs, try to "draw" letters on the floor with the toes of your foot. Write your name with one foot. ?Write your name with the toes of your other foot. ?Slowly repeat the movements for one minute as tolerated. ?Exercise C: Reach for the sky ?Reach your hands as far over your head as you can to stretch your spine. ?Move your hands and arms as if you are climbing a rope. ?Slowly repeat the movements for one minute as tolerated. ?Range of motion exercises ?Exercise A: Shoulder roll ?Let your arms hang loosely at your sides. ?Lift just your shoulders up toward your ears, then let them relax back down. ?When your shoulders feel loose, rotate your shoulders in backward and forward circles. ?Do shoulder rolls slowly for one minute as tolerated. ?Exercise B: March in place ?As if you are marching, pump your arms and lift your legs up and down. Lift your knees as high as you can. ?If you are unable to lift your knees, just pump your arms and move your ankles and feet up and down. ?March in place for one minute as tolerated. ?Exercise C: Seated jumping jacks ?Let your arms hang down straight. ?Keeping your arms straight, lift them up over your head. Aim to point your fingers to the ceiling. ?While you lift your arms, straighten your legs and slide your heels along the floor to your sides, as wide as you can. ?As you bring your arms back down to your sides, slide your legs back together. ?If you are unable to use your legs, just move your arms. ?Slowly repeat seated jumping jacks for one minute as tolerated. ?Strengthening exercises ?Exercise A: Shoulder squeeze ?Hold your arms straight out from your body to your sides, with your elbows bent and your fists pointed at the ceiling. ?Keeping your arms in the bent position, move them forward so your elbows and forearms meet in front of your face. ?Open your arms back out as wide as you can with your elbows still bent, until  you feel your shoulder blades squeezing together. Hold for 5 seconds. ?Slowly repeat the movements forward and backward for one minute as tolerated. ?Contact a health care provider if: ?You have to stop exercising due to any of the following: ?Pain. ?Nausea. ?Shortness of breath. ?Dizziness. ?Fatigue. ?You have significant pain or soreness after exercising. ?Get help right away if: ?You have chest pain. ?You have difficulty breathing. ?These symptoms may represent a serious problem that is an emergency. Do not wait to see if the symptoms will go away. Get medical help right away. Call your local emergency services (911 in the [place] Do not drive yourself to the hospital. ?Summary ?Exercises that you do while sitting (chair exercises) can strengthen your heart, burn calories, and keep muscles and joints healthy. ?You may benefit from chair exercises if you are unable to do standing exercises due to diabetic foot pain, obesity, recovery from surgery or injury, or other conditions. ?Before starting chair exercises, check with your health  care provider or a physical therapist to find out how much exercise you can tolerate and which exercises are safe for you. ?This information is not intended to replace advice given to you by your health care provider. Make sure you discuss any questions you have with your health care provider. ?Document Revised: 06/22/2020 Document Reviewed: 06/22/2020 ?Elsevier Patient Education ? 2022 [employer]. ? ? ?

## 2021-08-03 NOTE — Progress Notes (Signed)
? ?ADVANCED HF CLINIC CONSULT NOTE ? ?Primary Care: Dorise Hiss, PA-C ?Primary Cardiologist: Dr. Sallyanne Kuster ?HF Cardiologist: Dr. Aundra Dubin ? ?HPI: ?Zachary Hawkins is a 66 y.o. with a h/o HFpEF, OSA, CPAP, smoker, CKD Stage III, HTN, obesity,and PAF. Has had multiple ED/hospital visit over the last year.  Ongoing compliance issues.  ?  ?Admitted 03/2022 with A/C hypoxic/hypercarbic respiratory failure and volume overload. Treated with antibiotics steroids, nebs and diuretics.  ?  ?Admitted 04/29/22 with increased shortness of breath in the setting of HF exacerbation. Diuresed with IV lasix and transitioned to lasix 80 mg twice a day ?  ?Evaluated in the ED 05/2021 with COPD exacerbation. Treated with nebulizers and steroids.  ?  ?Admitted 06/23/21 with A/C respiratory failure and heart exacerbation. Placed on Bipap with improvement. Diuresed with IV lasix and transitioned to torsemide 100 mg daily. D/C weigth 344 pounds. Discharged 06/26/21 . ?  ?Seen in [facility] 3/23, volume up, likely in setting of high salt intake. Instructed to take extra torsemide 50 mg daily.  ? ?Admitted 07/17/21 with a/c CHF, diuresed with IV lasix. Discharged home, weight 338 lbs. ? ?Today he returns for post hospital HF follow up. Overall feeling fine. He wants to be more active and lose weight. Remains SOB walking short distances on flat ground. Wears 4-5 L oxygen at home. Doing better with wearing Bipap every night. Denies palpitations, abnormal bleeding, CP, dizziness, edema, or PND/Orthopnea. Appetite ok. No fever or chills. Weight at home 241-243 pounds. Taking all medications. Followed by [place]. Drinks a lot of fluid during the day. ? ?Cardiac Testing  ?- Echo 04/2021 EF 60-65% RV normal ? ?Review of Systems: [y] = yes, [ ]  = no  ? ?General: Weight gain [ ] ; Weight loss [ ] ; Anorexia [ ] ; Fatigue [ ] ; Fever [ ] ; Chills [ ] ; Weakness [ ]   ?Cardiac: Chest pain/pressure [ ] ; Resting SOB [ ] ; Exertional SOB [ ] ; Orthopnea [ ] ;  Pedal Edema [ ] ; Palpitations [ ] ; Syncope [ ] ; Presyncope [ ] ; Paroxysmal nocturnal dyspnea[ ]   ?Pulmonary: Cough [ ] ; Wheezing[ ] ; Hemoptysis[ ] ; Sputum [ ] ; Snoring [ ]   ?GI: Vomiting[ ] ; Dysphagia[ ] ; Melena[ ] ; Hematochezia [ ] ; Heartburn[ ] ; Abdominal pain [ ] ; Constipation [ ] ; Diarrhea [ ] ; BRBPR [ ]   ?GU: Hematuria[ ] ; Dysuria [ ] ; Nocturia[ ]   ?Vascular: Pain in legs with walking [ ] ; Pain in feet with lying flat [ ] ; Non-healing sores [ ] ; Stroke [ ] ; TIA [ ] ; Slurred speech [ ] ;  ?Neuro: Headaches[ ] ; Vertigo[ ] ; Seizures[ ] ; Paresthesias[ ] ;Blurred vision [ ] ; Diplopia [ ] ; Vision changes [ ]   ?Ortho/Skin: Arthritis [ ] ; Joint pain [ ] ; Muscle pain [ ] ; Joint swelling [ ] ; Back Pain Blue.Reese ]; Rash [ ]   ?Psych: Depression[ ] ; Anxiety[y ]  ?Heme: Bleeding problems [ ] ; Clotting disorders [ ] ; Anemia [ ]   ?Endocrine: Diabetes Blue.Reese ]; Thyroid dysfunction[ ]  ? ?Past Medical History:  ?Diagnosis Date  ? Asthma   ? BPH (benign prostatic hyperplasia)   ? Cardiomyopathy ([place]) 06/2017  ? EF 40-45%  ? CHF (congestive heart failure) ([place])   ? Chronic renal insufficiency, stage 3 (moderate) (HCC)   ? COPD (chronic obstructive pulmonary disease) ([place])   ? Diastolic dysfunction [id number]  ? grade 2   ? Hyperlipidemia   ? Hypertension   ? Hypertensive urgency   ? Obesity   ? Obesity   ? OSA (obstructive sleep apnea)   ?  Paroxysmal atrial fibrillation ([place]) 2022  ? on Eliquis  ? ?Current Outpatient Medications  ?Medication Sig Dispense Refill  ? ADVAIR HFA 115-21 MCG/ACT inhaler INHALE 2 PUFFS INTO THE LUNGS 2 (TWO) TIMES DAILY. 36 g 12  ? albuterol (VENTOLIN HFA) 108 (90 Base) MCG/ACT inhaler Inhale 2 puffs into the lungs every 4 (four) hours as needed for wheezing or shortness of breath. 18 g 0  ? amLODipine (NORVASC) 5 MG tablet Take 1 tablet (5 mg total) by mouth daily. 30 tablet 0  ? apixaban (ELIQUIS) 5 MG TABS tablet Take 1 tablet (5 mg total) by mouth 2 (two) times daily. 60 tablet 5  ? atorvastatin (LIPITOR) 10 MG  tablet Take 10 mg by mouth daily.    ? bisoprolol (ZEBETA) 5 MG tablet Take 1 tablet (5 mg total) by mouth daily. 90 tablet 3  ? dapagliflozin propanediol (FARXIGA) 10 MG TABS tablet Take 1 tablet (10 mg total) by mouth daily. 30 tablet 0  ? EPINEPHrine 0.3 mg/0.3 mL IJ SOAJ injection Inject 0.3 mg into the muscle as needed for anaphylaxis. (Patient taking differently: Inject 0.3 mg into the muscle once as needed for anaphylaxis.) 1 each 1  ? famotidine (PEPCID) 20 MG tablet Take 1 tablet (20 mg total) by mouth 2 (two) times daily. 28 tablet 0  ? fluticasone (FLONASE) 50 MCG/ACT nasal spray Place 1 spray into both nostrils daily. 16 g 2  ? Multiple Vitamin (MULTIVITAMIN WITH MINERALS) TABS tablet Take 1 tablet by mouth daily.    ? potassium chloride SA (KLOR-CON M) 20 MEQ tablet Take 1 tablet (20 mEq total) by mouth 2 (two) times daily. 180 tablet 1  ? saline (AYR) GEL Place 1 application into both nostrils at bedtime. 14.1 g 11  ? spironolactone (ALDACTONE) 25 MG tablet Take 1 tablet (25 mg total) by mouth daily. 90 tablet 3  ? tamsulosin (FLOMAX) 0.4 MG CAPS capsule Take 1 capsule (0.4 mg total) by mouth daily after supper. 30 capsule 2  ? torsemide (DEMADEX) 100 MG tablet Patient takes 1 tablet by mouth in the morning and 1/2 tablet in the evening.    ? ?No current facility-administered medications for this encounter.  ? ?Allergies  ?Allergen Reactions  ? Ace Inhibitors Anaphylaxis  ? Black Cherry Fruit Extract [Cherry Extract] Anaphylaxis  ?  Tongue  ? [place] Flavor [Flavoring Agent] Anaphylaxis  ? ?Social History  ? ?Socioeconomic History  ? Marital status: Single  ?  Spouse name: Not on file  ? Number of children: Not on file  ? Years of education: Not on file  ? Highest education level: Not on file  ?Occupational History  ? Not on file  ?Tobacco Use  ? Smoking status: Former  ?  Packs/day: 1.00  ?  Years: 36.00  ?  Pack years: 36.00  ?  Types: Cigarettes  ?  Quit date: 01/08/2021  ?  Years since quitting:  0.5  ? Smokeless tobacco: Never  ?Substance and Sexual Activity  ? Alcohol use: Yes  ?  Alcohol/week: 1.0 standard drink  ?  Types: 1 Shots of liquor per week  ?  Comment: socially  ? Drug use: Yes  ?  Types: Marijuana  ?  Comment: every other week  ? Sexual activity: Not on file  ?Other Topics Concern  ? Not on file  ?Social History Narrative  ? Not on file  ? ?Social Determinants of Health  ? ?Financial Resource Strain: High Risk  ? Difficulty of Paying  Living Expenses: Very hard  ?Food Insecurity: No Food Insecurity  ? Worried About Charity fundraiser in the Last Year: Never true  ? Ran Out of Food in the Last Year: Never true  ?Transportation Needs: No Transportation Needs  ? Lack of Transportation (Medical): No  ? Lack of Transportation (Non-Medical): No  ?Physical Activity: Not on file  ?Stress: Not on file  ?Social Connections: Not on file  ?Intimate Partner Violence: Not on file  ? ?Family History  ?Problem Relation Age of Onset  ? Diabetes Mother   ? Diabetes Father   ? ?BP 110/84   Pulse 64   Wt (!) 156.9 kg (346 lb)   SpO2 91%   BMI 45.65 kg/m?  ? ?Wt Readings from Last 3 Encounters:  ?08/05/21 (!) 156.9 kg (346 lb)  ?08/03/21 (!) 155.9 kg (343 lb 9.6 oz)  ?07/23/21 (!) 153.6 kg (338 lb 11.2 oz)  ? ?PHYSICAL EXAM: ?General:  NAD. No resp difficulty, arrived in [facility] on oxygen ?HEENT: Normal ?Neck: Supple. Thick neck. Carotids 2+ bilat; no bruits. No lymphadenopathy or thryomegaly appreciated. ?Cor: PMI nondisplaced. Regular rate & rhythm. No rubs, gallops or murmurs. ?Lungs: Diminished throughout ?Abdomen: Soft, nontender, nondistended. No hepatosplenomegaly. No bruits or masses. Good bowel sounds. ?Extremities: No cyanosis, clubbing, rash, edema ?Neuro: Alert & oriented x 3, cranial nerves grossly intact. Moves all 4 extremities w/o difficulty. Affect pleasant. ? ?ASSESSMENT & PLAN: ?1. HFpEF: Echo EF 60-65%. RV normal . Grade IDD. He is NYHA III, functional status confounded by body habitus and  chronic respiratory failure. He is not volume overloaded on exam ?- Continue torsemide 100 mg q am/50 mg q pm. BMET/BNP today. ?- Continue bisoprolol 5 mg daily.  ?- Continue Farxiga 10 mg daily. ?- Allergy to ace so

## 2021-08-05 ENCOUNTER — Ambulatory Visit (HOSPITAL_COMMUNITY)
Admission: RE | Admit: 2021-08-05 | Discharge: 2021-08-05 | Disposition: A | Discharge status: Home / Self Care | Payer: Medicaid Other | Source: Ambulatory Visit | Attending: Family Medicine | Admitting: Family Medicine

## 2021-08-05 ENCOUNTER — Encounter (HOSPITAL_COMMUNITY): Payer: Self-pay

## 2021-08-05 VITALS — BP 110/84 | HR 64 | Wt 346.0 lb

## 2021-08-05 DIAGNOSIS — J9612 Chronic respiratory failure with hypercapnia: Secondary | ICD-10-CM

## 2021-08-05 DIAGNOSIS — G4733 Obstructive sleep apnea (adult) (pediatric): Secondary | ICD-10-CM | POA: Diagnosis not present

## 2021-08-05 DIAGNOSIS — E669 Obesity, unspecified: Secondary | ICD-10-CM | POA: Insufficient documentation

## 2021-08-05 DIAGNOSIS — I480 Paroxysmal atrial fibrillation: Secondary | ICD-10-CM

## 2021-08-05 DIAGNOSIS — Z6841 Body Mass Index (BMI) 40.0 and over, adult: Secondary | ICD-10-CM | POA: Insufficient documentation

## 2021-08-05 DIAGNOSIS — J9611 Chronic respiratory failure with hypoxia: Secondary | ICD-10-CM

## 2021-08-05 DIAGNOSIS — I130 Hypertensive heart and chronic kidney disease with heart failure and stage 1 through stage 4 chronic kidney disease, or unspecified chronic kidney disease: Secondary | ICD-10-CM | POA: Diagnosis not present

## 2021-08-05 DIAGNOSIS — N1831 Chronic kidney disease, stage 3a: Secondary | ICD-10-CM

## 2021-08-05 DIAGNOSIS — Z7901 Long term (current) use of anticoagulants: Secondary | ICD-10-CM | POA: Diagnosis not present

## 2021-08-05 DIAGNOSIS — I5033 Acute on chronic diastolic (congestive) heart failure: Secondary | ICD-10-CM | POA: Diagnosis not present

## 2021-08-05 LAB — CBC
HCT: 46.8 % (ref 39.0–52.0)
Hemoglobin: 14.1 g/dL (ref 13.0–17.0)
MCH: 29 pg (ref 26.0–34.0)
MCHC: 30.1 g/dL (ref 30.0–36.0)
MCV: 96.1 fL (ref 80.0–100.0)
Platelets: 216 10*3/uL (ref 150–400)
RBC: 4.87 MIL/uL (ref 4.22–5.81)
RDW: 15.8 % — ABNORMAL HIGH (ref 11.5–15.5)
WBC: 6.9 10*3/uL (ref 4.0–10.5)
nRBC: 0.4 % — ABNORMAL HIGH (ref 0.0–0.2)

## 2021-08-05 LAB — BASIC METABOLIC PANEL
Anion gap: 6 (ref 5–15)
BUN: 25 mg/dL — ABNORMAL HIGH (ref 8–23)
CO2: 39 mmol/L — ABNORMAL HIGH (ref 22–32)
Calcium: 9 mg/dL (ref 8.9–10.3)
Chloride: 98 mmol/L (ref 98–111)
Creatinine, Ser: 1.48 mg/dL — ABNORMAL HIGH (ref 0.61–1.24)
GFR, Estimated: 53 mL/min — ABNORMAL LOW (ref >60)
Glucose, Bld: 109 mg/dL — ABNORMAL HIGH (ref 70–99)
Potassium: 4.5 mmol/L (ref 3.5–5.1)
Sodium: 143 mmol/L (ref 135–145)

## 2021-08-05 LAB — BRAIN NATRIURETIC PEPTIDE: B Natriuretic Peptide: 13.8 pg/mL (ref 0.0–100.0)

## 2021-08-05 NOTE — Patient Instructions (Signed)
Thank you for coming in today ? ?Labs were done today, if any labs are abnormal the clinic will call you ?No news is good news  ? ?Your physician recommends that you schedule a follow-up appointment in:  ?Keep follow up appointment with Dr. Shirlee Latch ? ?At the [facility], you and your health needs are our priority. As part of our continuing mission to provide you with exceptional heart care, we have created designated Provider Care Teams. These Care Teams include your primary Cardiologist (physician) and Advanced Practice Providers (APPs- Physician Assistants and Nurse Practitioners) who all work together to provide you with the care you need, when you need it.  ? ?You may see any of the following providers on your designated Care Team at your next follow up: ?Dr Arvilla Meres ?Dr Marca Ancona ?Tonye Becket, NP ?Robbie Lis, PA ?Jessica Milford,NP ?Anna Genre, PA ?Karle Plumber, PharmD ? ? ?Please be sure to bring in all your medications bottles to every appointment.  ? ?If you have any questions or concerns before your next appointment please send [place] a message through [place] or call our office at [phone].   ? ?TO LEAVE A MESSAGE FOR THE NURSE SELECT OPTION 2, PLEASE LEAVE A MESSAGE INCLUDING: ?YOUR NAME ?DATE OF BIRTH ?CALL BACK NUMBER ?REASON FOR CALL**this is important as we prioritize the call backs ? ?YOU WILL RECEIVE A CALL BACK THE SAME DAY AS LONG AS YOU CALL BEFORE 4:00 PM ? ?

## 2021-08-14 ENCOUNTER — Emergency Department (HOSPITAL_COMMUNITY): Payer: Medicaid Other

## 2021-08-14 ENCOUNTER — Inpatient Hospital Stay (HOSPITAL_COMMUNITY)
Admission: EM | Admit: 2021-08-14 | Discharge: 2021-08-21 | DRG: 208 | Disposition: A | Discharge status: Home / Self Care | Payer: Medicaid Other | Attending: Family Medicine | Admitting: Family Medicine

## 2021-08-14 ENCOUNTER — Inpatient Hospital Stay (HOSPITAL_COMMUNITY): Payer: Medicaid Other

## 2021-08-14 DIAGNOSIS — G9340 Encephalopathy, unspecified: Secondary | ICD-10-CM | POA: Diagnosis not present

## 2021-08-14 DIAGNOSIS — I5042 Chronic combined systolic (congestive) and diastolic (congestive) heart failure: Secondary | ICD-10-CM | POA: Diagnosis present

## 2021-08-14 DIAGNOSIS — Z87891 Personal history of nicotine dependence: Secondary | ICD-10-CM | POA: Diagnosis not present

## 2021-08-14 DIAGNOSIS — R451 Restlessness and agitation: Secondary | ICD-10-CM | POA: Diagnosis not present

## 2021-08-14 DIAGNOSIS — Z9102 Food additives allergy status: Secondary | ICD-10-CM | POA: Diagnosis not present

## 2021-08-14 DIAGNOSIS — N1831 Chronic kidney disease, stage 3a: Secondary | ICD-10-CM | POA: Diagnosis present

## 2021-08-14 DIAGNOSIS — I5032 Chronic diastolic (congestive) heart failure: Secondary | ICD-10-CM | POA: Diagnosis not present

## 2021-08-14 DIAGNOSIS — G9341 Metabolic encephalopathy: Secondary | ICD-10-CM | POA: Diagnosis present

## 2021-08-14 DIAGNOSIS — Z91199 Patient's noncompliance with other medical treatment and regimen due to unspecified reason: Secondary | ICD-10-CM | POA: Diagnosis not present

## 2021-08-14 DIAGNOSIS — J9622 Acute and chronic respiratory failure with hypercapnia: Secondary | ICD-10-CM | POA: Diagnosis present

## 2021-08-14 DIAGNOSIS — I130 Hypertensive heart and chronic kidney disease with heart failure and stage 1 through stage 4 chronic kidney disease, or unspecified chronic kidney disease: Secondary | ICD-10-CM | POA: Diagnosis present

## 2021-08-14 DIAGNOSIS — Z79899 Other long term (current) drug therapy: Secondary | ICD-10-CM

## 2021-08-14 DIAGNOSIS — J441 Chronic obstructive pulmonary disease with (acute) exacerbation: Secondary | ICD-10-CM | POA: Diagnosis present

## 2021-08-14 DIAGNOSIS — E875 Hyperkalemia: Secondary | ICD-10-CM | POA: Diagnosis present

## 2021-08-14 DIAGNOSIS — Z888 Allergy status to other drugs, medicaments and biological substances status: Secondary | ICD-10-CM | POA: Diagnosis not present

## 2021-08-14 DIAGNOSIS — J9621 Acute and chronic respiratory failure with hypoxia: Principal | ICD-10-CM

## 2021-08-14 DIAGNOSIS — I4821 Permanent atrial fibrillation: Secondary | ICD-10-CM | POA: Diagnosis present

## 2021-08-14 DIAGNOSIS — N400 Enlarged prostate without lower urinary tract symptoms: Secondary | ICD-10-CM | POA: Diagnosis present

## 2021-08-14 DIAGNOSIS — J9601 Acute respiratory failure with hypoxia: Secondary | ICD-10-CM | POA: Diagnosis present

## 2021-08-14 DIAGNOSIS — N179 Acute kidney failure, unspecified: Secondary | ICD-10-CM | POA: Diagnosis not present

## 2021-08-14 DIAGNOSIS — Z6841 Body Mass Index (BMI) 40.0 and over, adult: Secondary | ICD-10-CM

## 2021-08-14 DIAGNOSIS — Z20822 Contact with and (suspected) exposure to covid-19: Secondary | ICD-10-CM | POA: Diagnosis present

## 2021-08-14 DIAGNOSIS — E669 Obesity, unspecified: Secondary | ICD-10-CM | POA: Diagnosis present

## 2021-08-14 DIAGNOSIS — K5900 Constipation, unspecified: Secondary | ICD-10-CM | POA: Diagnosis not present

## 2021-08-14 DIAGNOSIS — I429 Cardiomyopathy, unspecified: Secondary | ICD-10-CM | POA: Diagnosis present

## 2021-08-14 DIAGNOSIS — E785 Hyperlipidemia, unspecified: Secondary | ICD-10-CM | POA: Diagnosis present

## 2021-08-14 DIAGNOSIS — E876 Hypokalemia: Secondary | ICD-10-CM | POA: Diagnosis not present

## 2021-08-14 DIAGNOSIS — G4733 Obstructive sleep apnea (adult) (pediatric): Secondary | ICD-10-CM | POA: Diagnosis present

## 2021-08-14 DIAGNOSIS — Z7901 Long term (current) use of anticoagulants: Secondary | ICD-10-CM

## 2021-08-14 LAB — CBC WITH DIFFERENTIAL/PLATELET
Abs Immature Granulocytes: 0.21 10*3/uL — ABNORMAL HIGH (ref 0.00–0.07)
Basophils Absolute: 0.1 10*3/uL (ref 0.0–0.1)
Basophils Relative: 1 %
Eosinophils Absolute: 0.4 10*3/uL (ref 0.0–0.5)
Eosinophils Relative: 4 %
HCT: 47.9 % (ref 39.0–52.0)
Hemoglobin: 13.8 g/dL (ref 13.0–17.0)
Immature Granulocytes: 2 %
Lymphocytes Relative: 26 %
Lymphs Abs: 2.5 10*3/uL (ref 0.7–4.0)
MCH: 28.6 pg (ref 26.0–34.0)
MCHC: 28.8 g/dL — ABNORMAL LOW (ref 30.0–36.0)
MCV: 99.4 fL (ref 80.0–100.0)
Monocytes Absolute: 1.4 10*3/uL — ABNORMAL HIGH (ref 0.1–1.0)
Monocytes Relative: 14 %
Neutro Abs: 5.2 10*3/uL (ref 1.7–7.7)
Neutrophils Relative %: 53 %
Platelets: 218 10*3/uL (ref 150–400)
RBC: 4.82 MIL/uL (ref 4.22–5.81)
RDW: 16.1 % — ABNORMAL HIGH (ref 11.5–15.5)
WBC: 9.8 10*3/uL (ref 4.0–10.5)
nRBC: 1.2 % — ABNORMAL HIGH (ref 0.0–0.2)

## 2021-08-14 LAB — MAGNESIUM: Magnesium: 2.7 mg/dL — ABNORMAL HIGH (ref 1.7–2.4)

## 2021-08-14 LAB — I-STAT ARTERIAL BLOOD GAS, ED
Acid-Base Excess: 15 mmol/L — ABNORMAL HIGH (ref 0.0–2.0)
Bicarbonate: 43.7 mmol/L — ABNORMAL HIGH (ref 20.0–28.0)
Calcium, Ion: 1.1 mmol/L — ABNORMAL LOW (ref 1.15–1.40)
HCT: 46 % (ref 39.0–52.0)
Hemoglobin: 15.6 g/dL (ref 13.0–17.0)
O2 Saturation: 92 %
Patient temperature: 99.6
Potassium: 4.9 mmol/L (ref 3.5–5.1)
Sodium: 136 mmol/L (ref 135–145)
TCO2: 46 mmol/L — ABNORMAL HIGH (ref 22–32)
pCO2 arterial: 73.2 mmHg (ref 32–48)
pH, Arterial: 7.387 (ref 7.35–7.45)
pO2, Arterial: 69 mmHg — ABNORMAL LOW (ref 83–108)

## 2021-08-14 LAB — BASIC METABOLIC PANEL
Anion gap: 11 (ref 5–15)
BUN: 18 mg/dL (ref 8–23)
CO2: 38 mmol/L — ABNORMAL HIGH (ref 22–32)
Calcium: 9 mg/dL (ref 8.9–10.3)
Chloride: 90 mmol/L — ABNORMAL LOW (ref 98–111)
Creatinine, Ser: 1.52 mg/dL — ABNORMAL HIGH (ref 0.61–1.24)
GFR, Estimated: 51 mL/min — ABNORMAL LOW (ref >60)
Glucose, Bld: 183 mg/dL — ABNORMAL HIGH (ref 70–99)
Potassium: 4.7 mmol/L (ref 3.5–5.1)
Sodium: 139 mmol/L (ref 135–145)

## 2021-08-14 LAB — COMPREHENSIVE METABOLIC PANEL
ALT: 18 U/L (ref 0–44)
AST: 28 U/L (ref 15–41)
Albumin: 3.4 g/dL — ABNORMAL LOW (ref 3.5–5.0)
Alkaline Phosphatase: 67 U/L (ref 38–126)
Anion gap: 6 (ref 5–15)
BUN: 18 mg/dL (ref 8–23)
CO2: 39 mmol/L — ABNORMAL HIGH (ref 22–32)
Calcium: 8.6 mg/dL — ABNORMAL LOW (ref 8.9–10.3)
Chloride: 93 mmol/L — ABNORMAL LOW (ref 98–111)
Creatinine, Ser: 1.61 mg/dL — ABNORMAL HIGH (ref 0.61–1.24)
GFR, Estimated: 48 mL/min — ABNORMAL LOW (ref >60)
Glucose, Bld: 142 mg/dL — ABNORMAL HIGH (ref 70–99)
Potassium: 5.8 mmol/L — ABNORMAL HIGH (ref 3.5–5.1)
Sodium: 138 mmol/L (ref 135–145)
Total Bilirubin: 1.1 mg/dL (ref 0.3–1.2)
Total Protein: 7.2 g/dL (ref 6.5–8.1)

## 2021-08-14 LAB — I-STAT VENOUS BLOOD GAS, ED
Acid-Base Excess: 19 mmol/L — ABNORMAL HIGH (ref 0.0–2.0)
Bicarbonate: 44 mmol/L — ABNORMAL HIGH (ref 20.0–28.0)
Calcium, Ion: 0.79 mmol/L — CL (ref 1.15–1.40)
HCT: 45 % (ref 39.0–52.0)
Hemoglobin: 15.3 g/dL (ref 13.0–17.0)
O2 Saturation: 85 %
Potassium: 6.1 mmol/L — ABNORMAL HIGH (ref 3.5–5.1)
Sodium: 132 mmol/L — ABNORMAL LOW (ref 135–145)
TCO2: 46 mmol/L — ABNORMAL HIGH (ref 22–32)
pCO2, Ven: 50.3 mmHg (ref 44–60)
pH, Ven: 7.55 — ABNORMAL HIGH (ref 7.25–7.43)
pO2, Ven: 45 mmHg (ref 32–45)

## 2021-08-14 LAB — GLUCOSE, CAPILLARY: Glucose-Capillary: 183 mg/dL — ABNORMAL HIGH (ref 70–99)

## 2021-08-14 LAB — RESP PANEL BY RT-PCR (FLU A&B, COVID) ARPGX2
Influenza A by PCR: NEGATIVE
Influenza B by PCR: NEGATIVE
SARS Coronavirus 2 by RT PCR: NEGATIVE

## 2021-08-14 LAB — BLOOD GAS, ARTERIAL
Acid-Base Excess: 15.1 mmol/L — ABNORMAL HIGH (ref 0.0–2.0)
Bicarbonate: 48.9 mmol/L — ABNORMAL HIGH (ref 20.0–28.0)
Drawn by: 164
O2 Saturation: 98.8 %
Patient temperature: 37
pCO2 arterial: >123 mmHg (ref 32–48)
pH, Arterial: 7.2 — ABNORMAL LOW (ref 7.35–7.45)
pO2, Arterial: 152 mmHg — ABNORMAL HIGH (ref 83–108)

## 2021-08-14 LAB — ACETAMINOPHEN LEVEL: Acetaminophen (Tylenol), Serum: <10 ug/mL — ABNORMAL LOW (ref 10–30)

## 2021-08-14 LAB — MRSA NEXT GEN BY PCR, NASAL: MRSA by PCR Next Gen: DETECTED — AB

## 2021-08-14 LAB — ETHANOL: Alcohol, Ethyl (B): <10 mg/dL (ref <10)

## 2021-08-14 LAB — PROCALCITONIN: Procalcitonin: <0.1 ng/mL

## 2021-08-14 LAB — AMMONIA: Ammonia: 42 umol/L — ABNORMAL HIGH (ref 9–35)

## 2021-08-14 LAB — SALICYLATE LEVEL: Salicylate Lvl: <7 mg/dL — ABNORMAL LOW (ref 7.0–30.0)

## 2021-08-14 LAB — BRAIN NATRIURETIC PEPTIDE: B Natriuretic Peptide: 19.5 pg/mL (ref 0.0–100.0)

## 2021-08-14 LAB — TROPONIN I (HIGH SENSITIVITY)
Troponin I (High Sensitivity): 16 ng/L (ref <18)
Troponin I (High Sensitivity): 17 ng/L (ref <18)

## 2021-08-14 MED ORDER — ORAL CARE MOUTH RINSE
15.0000 mL | OROMUCOSAL | Status: DC
  Administered 2021-08-14 – 2021-08-18 (×35): 15 mL via OROMUCOSAL

## 2021-08-14 MED ORDER — SUCCINYLCHOLINE CHLORIDE 200 MG/10ML IV SOSY
150.0000 mg | PREFILLED_SYRINGE | Freq: Once | INTRAVENOUS | Status: AC
  Administered 2021-08-14: 150 mg via INTRAVENOUS
  Filled 2021-08-14: qty 10

## 2021-08-14 MED ORDER — APIXABAN 5 MG PO TABS
5.0000 mg | ORAL_TABLET | Freq: Two times a day (BID) | ORAL | Status: DC
  Administered 2021-08-14 – 2021-08-18 (×8): 5 mg
  Filled 2021-08-14 (×9): qty 1

## 2021-08-14 MED ORDER — FENTANYL 2500MCG IN NS 250ML (10MCG/ML) PREMIX INFUSION
50.0000 ug/h | INTRAVENOUS | Status: DC
  Administered 2021-08-15: 100 ug/h via INTRAVENOUS
  Administered 2021-08-16: 150 ug/h via INTRAVENOUS
  Administered 2021-08-17 – 2021-08-18 (×4): 200 ug/h via INTRAVENOUS
  Filled 2021-08-14 (×6): qty 250

## 2021-08-14 MED ORDER — DOCUSATE SODIUM 50 MG/5ML PO LIQD
100.0000 mg | Freq: Two times a day (BID) | ORAL | Status: DC
  Administered 2021-08-14 – 2021-08-18 (×7): 100 mg
  Filled 2021-08-14 (×7): qty 10

## 2021-08-14 MED ORDER — CHLORHEXIDINE GLUCONATE 0.12% ORAL RINSE (MEDLINE KIT)
15.0000 mL | Freq: Two times a day (BID) | OROMUCOSAL | Status: DC
  Administered 2021-08-14 – 2021-08-18 (×8): 15 mL via OROMUCOSAL

## 2021-08-14 MED ORDER — BISOPROLOL FUMARATE 5 MG PO TABS
5.0000 mg | ORAL_TABLET | Freq: Every day | ORAL | Status: DC
  Administered 2021-08-14 – 2021-08-18 (×5): 5 mg
  Filled 2021-08-14 (×5): qty 1

## 2021-08-14 MED ORDER — PROPOFOL 1000 MG/100ML IV EMUL
5.0000 ug/kg/min | INTRAVENOUS | Status: DC
  Administered 2021-08-14: 5 ug/kg/min via INTRAVENOUS
  Filled 2021-08-14: qty 100

## 2021-08-14 MED ORDER — METHYLPREDNISOLONE SODIUM SUCC 125 MG IJ SOLR
80.0000 mg | Freq: Two times a day (BID) | INTRAMUSCULAR | Status: DC
Start: 2021-08-15 — End: 2021-08-17
  Administered 2021-08-15 – 2021-08-17 (×5): 80 mg via INTRAVENOUS
  Filled 2021-08-14 (×5): qty 2

## 2021-08-14 MED ORDER — IPRATROPIUM-ALBUTEROL 0.5-2.5 (3) MG/3ML IN SOLN
3.0000 mL | RESPIRATORY_TRACT | Status: DC
Start: 2021-08-14 — End: 2021-08-17
  Administered 2021-08-14 – 2021-08-17 (×17): 3 mL via RESPIRATORY_TRACT
  Filled 2021-08-14 (×16): qty 3

## 2021-08-14 MED ORDER — IPRATROPIUM-ALBUTEROL 0.5-2.5 (3) MG/3ML IN SOLN: 3.0000 mL | RESPIRATORY_TRACT | Status: DC

## 2021-08-14 MED ORDER — PANTOPRAZOLE 2 MG/ML SUSPENSION
40.0000 mg | Freq: Every day | ORAL | Status: DC
  Administered 2021-08-14 – 2021-08-18 (×5): 40 mg
  Filled 2021-08-14 (×5): qty 20

## 2021-08-14 MED ORDER — TAMSULOSIN HCL 0.4 MG PO CAPS: 0.4000 mg | ORAL_CAPSULE | Freq: Every day | ORAL | Status: DC

## 2021-08-14 MED ORDER — SODIUM ZIRCONIUM CYCLOSILICATE 10 G PO PACK
10.0000 g | PACK | Freq: Once | ORAL | Status: AC
  Administered 2021-08-15: 10 g

## 2021-08-14 MED ORDER — ETOMIDATE 2 MG/ML IV SOLN
20.0000 mg | Freq: Once | INTRAVENOUS | Status: AC
  Administered 2021-08-14: 20 mg via INTRAVENOUS
  Filled 2021-08-14: qty 10

## 2021-08-14 MED ORDER — IPRATROPIUM-ALBUTEROL 0.5-2.5 (3) MG/3ML IN SOLN
3.0000 mL | RESPIRATORY_TRACT | Status: DC | PRN
Start: 2021-08-14 — End: 2021-08-21

## 2021-08-14 MED ORDER — BUDESONIDE 0.25 MG/2ML IN SUSP
0.2500 mg | Freq: Two times a day (BID) | RESPIRATORY_TRACT | Status: DC
  Administered 2021-08-14 – 2021-08-21 (×14): 0.25 mg via RESPIRATORY_TRACT
  Filled 2021-08-14 (×14): qty 2

## 2021-08-14 MED ORDER — ETOMIDATE 2 MG/ML IV SOLN
INTRAVENOUS | Status: DC | PRN
  Administered 2021-08-14: 20 mg via INTRAVENOUS

## 2021-08-14 MED ORDER — SODIUM ZIRCONIUM CYCLOSILICATE 10 G PO PACK
10.0000 g | PACK | Freq: Once | ORAL | Status: DC
  Filled 2021-08-14: qty 1

## 2021-08-14 MED ORDER — FENTANYL 2500MCG IN NS 250ML (10MCG/ML) PREMIX INFUSION
25.0000 ug/h | INTRAVENOUS | Status: DC
  Administered 2021-08-14: 50 ug/h via INTRAVENOUS
  Filled 2021-08-14: qty 250

## 2021-08-14 MED ORDER — DOCUSATE SODIUM 100 MG PO CAPS: 100.0000 mg | ORAL_CAPSULE | Freq: Two times a day (BID) | ORAL | Status: DC | PRN

## 2021-08-14 MED ORDER — ONDANSETRON HCL 4 MG/2ML IJ SOLN
4.0000 mg | Freq: Four times a day (QID) | INTRAMUSCULAR | Status: DC | PRN
  Administered 2021-08-17: 4 mg via INTRAVENOUS
  Filled 2021-08-14: qty 2

## 2021-08-14 MED ORDER — FENTANYL CITRATE PF 50 MCG/ML IJ SOSY
50.0000 ug | PREFILLED_SYRINGE | Freq: Once | INTRAMUSCULAR | Status: DC
  Filled 2021-08-14: qty 1

## 2021-08-14 MED ORDER — IPRATROPIUM-ALBUTEROL 0.5-2.5 (3) MG/3ML IN SOLN
3.0000 mL | Freq: Once | RESPIRATORY_TRACT | Status: AC
Start: 2021-08-14 — End: 2021-08-14
  Administered 2021-08-14: 3 mL via RESPIRATORY_TRACT
  Filled 2021-08-14: qty 3

## 2021-08-14 MED ORDER — POLYETHYLENE GLYCOL 3350 17 G PO PACK: 17.0000 g | PACK | Freq: Every day | ORAL | Status: DC | PRN

## 2021-08-14 MED ORDER — LORAZEPAM 2 MG/ML IJ SOLN
1.0000 mg | Freq: Once | INTRAMUSCULAR | Status: AC
  Administered 2021-08-14: 1 mg via INTRAVENOUS
  Filled 2021-08-14: qty 1

## 2021-08-14 MED ORDER — HYDRALAZINE HCL 20 MG/ML IJ SOLN
10.0000 mg | INTRAMUSCULAR | Status: DC | PRN
  Administered 2021-08-17: 20 mg via INTRAVENOUS
  Filled 2021-08-14: qty 1

## 2021-08-14 MED ORDER — POLYETHYLENE GLYCOL 3350 17 G PO PACK
17.0000 g | PACK | Freq: Every day | ORAL | Status: DC
  Administered 2021-08-14 – 2021-08-17 (×4): 17 g
  Filled 2021-08-14 (×3): qty 1

## 2021-08-14 MED ORDER — CHLORHEXIDINE GLUCONATE CLOTH 2 % EX PADS
6.0000 | MEDICATED_PAD | Freq: Every day | CUTANEOUS | Status: DC
  Administered 2021-08-14 – 2021-08-17 (×4): 6 via TOPICAL

## 2021-08-14 MED ORDER — FENTANYL BOLUS VIA INFUSION
50.0000 ug | INTRAVENOUS | Status: DC | PRN
  Administered 2021-08-14: 100 ug via INTRAVENOUS
  Filled 2021-08-14: qty 50

## 2021-08-14 MED ORDER — PANTOPRAZOLE SODIUM 40 MG PO TBEC
40.0000 mg | DELAYED_RELEASE_TABLET | Freq: Every day | ORAL | Status: DC
  Filled 2021-08-14: qty 1

## 2021-08-14 MED ORDER — ATORVASTATIN CALCIUM 10 MG PO TABS
10.0000 mg | ORAL_TABLET | Freq: Every day | ORAL | Status: DC
  Administered 2021-08-14 – 2021-08-18 (×5): 10 mg
  Filled 2021-08-14 (×5): qty 1

## 2021-08-14 MED ORDER — PROPOFOL 1000 MG/100ML IV EMUL
0.0000 ug/kg/min | INTRAVENOUS | Status: DC
  Administered 2021-08-14: 30 ug/kg/min via INTRAVENOUS
  Administered 2021-08-15: 40 ug/kg/min via INTRAVENOUS
  Administered 2021-08-15 (×2): 30 ug/kg/min via INTRAVENOUS
  Administered 2021-08-15: 40 ug/kg/min via INTRAVENOUS
  Administered 2021-08-15: 30 ug/kg/min via INTRAVENOUS
  Administered 2021-08-15: 40 ug/kg/min via INTRAVENOUS
  Administered 2021-08-15: 30 ug/kg/min via INTRAVENOUS
  Administered 2021-08-15 – 2021-08-17 (×13): 40 ug/kg/min via INTRAVENOUS
  Filled 2021-08-14 (×2): qty 100
  Filled 2021-08-14: qty 200
  Filled 2021-08-14 (×7): qty 100
  Filled 2021-08-14: qty 200
  Filled 2021-08-14 (×8): qty 100

## 2021-08-14 MED ORDER — SUCCINYLCHOLINE CHLORIDE 20 MG/ML IJ SOLN
INTRAMUSCULAR | Status: DC | PRN
  Administered 2021-08-14: 150 mg via INTRAVENOUS

## 2021-08-14 MED ORDER — CALCIUM GLUCONATE-NACL 1-0.675 GM/50ML-% IV SOLN
1.0000 g | Freq: Once | INTRAVENOUS | Status: AC
  Administered 2021-08-14: 1000 mg via INTRAVENOUS
  Filled 2021-08-14: qty 50

## 2021-08-14 MED ORDER — FUROSEMIDE 10 MG/ML IJ SOLN
60.0000 mg | Freq: Once | INTRAMUSCULAR | Status: AC
  Administered 2021-08-14: 60 mg via INTRAVENOUS
  Filled 2021-08-14: qty 6

## 2021-08-14 MED ORDER — FENTANYL BOLUS VIA INFUSION
50.0000 ug | INTRAVENOUS | Status: DC | PRN
  Administered 2021-08-15 – 2021-08-18 (×24): 100 ug via INTRAVENOUS
  Filled 2021-08-14: qty 100

## 2021-08-14 MED ORDER — HEPARIN SODIUM (PORCINE) 5000 UNIT/ML IJ SOLN: 5000.0000 [IU] | Freq: Three times a day (TID) | INTRAMUSCULAR | Status: DC

## 2021-08-14 NOTE — Progress Notes (Signed)
Obtained report from ED nurse Megan Reggiero, RN for transfer to [facility]M ?

## 2021-08-14 NOTE — ED Triage Notes (Signed)
Pt from home via [facility] as resp distress on CPAP. Pt supposed to be on 4L [place] constant at baseline, per report pt wears O2 intermittently. Pt found by home health fatigued, not responding. Pt received 2 duonebs, 10 albuterol, 125 solumedrol, 0.3 epi, 2 mag, 1 atrovent. Per EMS, not consistent w home O2 at baseline.   ? ?22LAC ?VSS ?

## 2021-08-14 NOTE — Progress Notes (Addendum)
eLink Physician-Brief Progress Note ?Patient Name: TREYVIN GLIDDEN ?DOB: [birth year] ?MRN: [id number] ? ? ?Date of Service ? 08/14/2021  ?HPI/Events of Note ? 6M with HFrEF, COPD on 4L, OSA non compliant, PAF who presented with hypoxemia and AMS. Intubated in the ED for worsening hypercarbia. PCCM admitted for Acute on Chronic hypercapnic and hypoxemic respiratory failure ? ?CT head - NAICA ?CXR - ETT appropriate position ?ABG - Compensated hypercarbic respiratory failure. PO2 69 ?BMET >stable CKD ?Ammonia 42 ?Normal trop and tox panel ? ?Critically ill male on MV withdraws to noxious stimuli. On propofol and fentanyl. No pressors ? ?  ?eICU Interventions ? Full vent support, steroids, nebs, diuretics ?FIO2 100% PEEP increased to 8 ?ABG in am  ? ? ? ?Intervention Category ?Evaluation Type: New Patient Evaluation ? ?Lenard Kampf Mechele Collin ?08/14/2021, 11:19 PM ?

## 2021-08-14 NOTE — ED Provider Notes (Signed)
INTUBATION ?Performed by: Micheline Maze ? ?Required items: required blood products, implants, devices, and special equipment available ?Patient identity confirmed: provided demographic data and hospital-assigned identification number ?Time out: Immediately prior to procedure a "time out" was called to verify the correct patient, procedure, equipment, support staff and site/side marked as required. ? ?Indications: Respiratory failure and inability to protect his airway ? ?Intubation method: Glidescope Laryngoscopy  ? ?Preoxygenation: BVM ? ?Sedatives: Etomidate ?Paralytic: Succinylcholine ? ?Tube Size: 7.5 cuffed ? ?Post-procedure assessment: chest rise and ETCO2 monitor ?Breath sounds: equal and absent over the epigastrium ?Tube secured with: ETT holder ?Chest x-ray interpreted by radiologist and me. ? ?Chest x-ray findings: endotracheal tube at level of the carina. Consider withdrawing.  ? ?Patient tolerated the procedure well with no immediate complications. ? ? ?  ?Micheline Maze, MD ?08/14/21 2156 ? ?  ?Maia Plan, MD ?08/15/21 1722 ? ?

## 2021-08-14 NOTE — Progress Notes (Signed)
RT transported patient to [facility]M from ED without any complications. 

## 2021-08-14 NOTE — H&P (Addendum)
? ?NAME:  Zachary Hawkins, MRN:  [id number], DOB:  October 15, 1955, LOS: 0 ?ADMISSION DATE:  08/14/2021, CONSULTATION DATE:  4/7 ?REFERRING MD:  Dr. Laverta Baltimore, CHIEF COMPLAINT:  COPD exacerbation  ? ?History of Present Illness:  ?Patient is a 66 year old male with pertinent PMH of HFrEF with cardiomyopathy, COPD on 4 LNC at home, OSA, HTN, PAF presents to [facility] ED on 4/7 with SOB/AMS. ? ?EMS called to patient's house on 4/7 due to patient having SOB.  Upon arrival EMS states that patient sats were 50% and patient was altered found on room air.  Patient was also found with a bottle of pain medicine which was mostly empty. Patient given DuoNebs, 125 Solu-Medrol, epi, mag, and started on CPAP.  Initially EMS states that the patient refused to come to hospital but after 30 minutes of discussion decided to come in for further evaluation.  EMS states that he is intermittently compliant with home oxygen and is supposed to wear CPAP but never does. Patient transported to [facility] ED. ? ?Patient arrived to [facility] ED on 4/7.  Patient denies chest pain/fever.  Patient's breathing is initially appeared more comfortable on CPAP and sats stable.  Afebrile and BP mildly hypertensive.  EKG with sinus tach rate 104.  CXR appears unchanged from previous CXRs, showing stable appearance of chest with percent stent cardiomegaly and bibasilar atelectasis.  BNP 19.5.  COVID flu negative.  Initial VBG 7.55, CO2 50, PO2 45, HCO3 44.  K 5.8, glucose 142, BUN 18, creatinine 1.61 (baseline 1.5).  Troponin 16.  WBC 9.8, Hgb 13.8.  Patient's mental status or altered.  Repeat ABG showing pH 7.2, CO2 greater 123, PO2 152, HCO3 48.  Patient intubated in ED.  PCCM consulted for ICU admission. ? ? ?Pertinent  Medical History  ? ?Past Medical History:  ?Diagnosis Date  ? Asthma   ? BPH (benign prostatic hyperplasia)   ? Cardiomyopathy ([place]) 06/2017  ? EF 40-45%  ? CHF (congestive heart failure) ([place])   ? Chronic renal insufficiency, stage 3 (moderate) (HCC)   ? COPD  (chronic obstructive pulmonary disease) ([place])   ? Diastolic dysfunction [id number]  ? grade 2   ? Hyperlipidemia   ? Hypertension   ? Hypertensive urgency   ? Obesity   ? Obesity   ? OSA (obstructive sleep apnea)   ? Paroxysmal atrial fibrillation ([place]) 2022  ? on Eliquis  ? ? ? ?Significant Hospital Events: ?Including procedures, antibiotic start and stop dates in addition to other pertinent events   ?4/7: admitted to [facility] for copd exacerbation resp failure; intubated ? ?Interim History / Subjective:  ?Patient intubated on PRVC ?Sats 97% ?On sedation propofol/fentanyl ? ?Objective   ?SpO2 100 %. ?   ?Vent Mode: PRVC ?FiO2 (%):  [100 %] 100 % ?Set Rate:  [18 bmp] 18 bmp ?Vt Set:  [620 mL] 620 mL ?PEEP:  [5 cmH20] 5 cmH20  ?No intake or output data in the 24 hours ending 08/14/21 1910 ?There were no vitals filed for this visit. ? ?Examination: ?General:  critically ill obese appearing male on mech vent ?HEENT: MM pink/moist; ETT in place ?Neuro: sedated; pupils pinpoint b/l ?CV: s1s2, no m/r/g ?PULM:  dim BS bilaterally; on mech vent PRVC ?GI: soft, bsx4 active  ?Extremities: warm/dry, trace BLE edema  ?Skin: no rashes or lesions appreciated ? ? ?Resolved Hospital Problem list   ? ? ?Assessment & Plan:  ?Acute on chronic respiratory failure with hypercarbia ?Hx of asthma/copd: 4 LNC baseline O2 at  home; takes Advair, albuterol ?Hx of OSA: Noncompliant with CPAP ?P: ?-admit to icu ?-Continue Mech vent PRVC 6 to 8 cc/kg ?-We will repeat ABG in few hours; adjust settings accordingly ?-Wean FiO2 for sats greater than 90% ?-VAP prevention in place ?-Wean sedation for RASS 0 to -1 ?-Daily SBT/SBT ?-Scheduled DuoNebs and Pulmicort ?-IV steroids ?- giving lasix 60 IV ? ?Acute metabolic encephalopathy ?-CO2 on initial ABG 50; now greater than 120 ?-EMS states unless empty bottle of pain meds at scene ?P: ?-Continue Mech vent and increased settings for normocapnia ?-Check UDS, salicylate level, acetaminophen level, ammonia ?-Check  CT head ?-Limit sedating meds for RASS 0 to -1 ?-Frequent neurochecks ? ?Chronic diastolic HF: sees HF outpt; on torsemide, spironolactone, Farxiga, amlodipine, bisoprolol ?Hx of HTN/HLD ?P: ?-bnp 19.5, trop 16 ?-trend troponin ?-IV Lasix as above ?-Resume bisoprolol tonight; consider resuming other home meds tomorrow in a.m. ?-As needed hydralazine for hypertension ?-Strict I/os; daily weights ?-Resume statin ? ?Paroxysmal Afib: Eliquis at home ?P: ?-telemetry monitoring ?-Continue bisoprolol and Eliquis ? ?Hyperkalemia ?CKD stage 3: creat 1.61; baseline around 1.4-1.5 ?P: ?-We will give Lokelma, calcium gluconate, iv lasix ?-Repeat BMP later tonight ?-Trend BMP / urinary output ?-Replace electrolytes as indicated ?-Avoid nephrotoxic agents, ensure adequate renal perfusion ? ?BPH ?P: ?-Tamsulosin ? ? ?Best Practice (right click and "Reselect all SmartList Selections" daily)  ? ?Diet/type: NPO w/ meds via tube ?DVT prophylaxis: DOAC ?GI prophylaxis: PPI ?Lines: N/A ?Foley:  N/A ?Code Status:  full code ?Last date of multidisciplinary goals of care discussion [pending] ? ?Labs   ?CBC: ?Recent Labs  ?Lab 08/14/21 ?1648 08/14/21 ?1655  ?WBC  --  9.8  ?NEUTROABS  --  5.2  ?HGB 15.3 13.8  ?HCT 45.0 47.9  ?MCV  --  99.4  ?PLT  --  218  ? ? ?Basic Metabolic Panel: ?Recent Labs  ?Lab 08/14/21 ?1648 08/14/21 ?1655  ?NA 132* 138  ?K 6.1* 5.8*  ?CL  --  93*  ?CO2  --  39*  ?GLUCOSE  --  142*  ?BUN  --  18  ?CREATININE  --  1.61*  ?CALCIUM  --  8.6*  ? ?GFR: ?Estimated Creatinine Clearance: 73.5 mL/min (A) (by C-G formula based on SCr of 1.61 mg/dL (H)). ?Recent Labs  ?Lab 08/14/21 ?1655  ?WBC 9.8  ? ? ?Liver Function Tests: ?Recent Labs  ?Lab 08/14/21 ?1655  ?AST 28  ?ALT 18  ?ALKPHOS 67  ?BILITOT 1.1  ?PROT 7.2  ?ALBUMIN 3.4*  ? ?No results for input(s): LIPASE, AMYLASE in the last 168 hours. ?No results for input(s): AMMONIA in the last 168 hours. ? ?ABG ?   ?Component Value Date/Time  ? PHART 7.38 07/18/2021 1028  ? PCO2ART  74 (HH) 07/18/2021 1028  ? PO2ART 71 (L) 07/18/2021 1028  ? HCO3 44.0 (H) 08/14/2021 1648  ? TCO2 46 (H) 08/14/2021 1648  ? ACIDBASEDEF 2.0 03/12/2021 0557  ? O2SAT 85 08/14/2021 1648  ?  ? ?Coagulation Profile: ?No results for input(s): INR, PROTIME in the last 168 hours. ? ?Cardiac Enzymes: ?No results for input(s): CKTOTAL, CKMB, CKMBINDEX, TROPONINI in the last 168 hours. ? ?HbA1C: ?No results found for: HGBA1C ? ?CBG: ?No results for input(s): GLUCAP in the last 168 hours. ? ?Review of Systems:   ?Patient is encephalopathic and/or intubated. Therefore history has been obtained from chart review.  ? ? ?Past Medical History:  ?He,  has a past medical history of Asthma, BPH (benign prostatic hyperplasia), Cardiomyopathy ([place]) (06/2017), CHF (  congestive heart failure) ([place]), Chronic renal insufficiency, stage 3 (moderate) (HCC), COPD (chronic obstructive pulmonary disease) ([place]), Diastolic dysfunction ([id number]), Hyperlipidemia, Hypertension, Hypertensive urgency, Obesity, Obesity, OSA (obstructive sleep apnea), and Paroxysmal atrial fibrillation ([place]) (2022).  ? ?Surgical History:  ?No past surgical history on file.  ? ?Social History:  ? reports that he quit smoking about 7 months ago. His smoking use included cigarettes. He has a 36.00 pack-year smoking history. He has never used smokeless tobacco. He reports current alcohol use of about 1.0 standard drink per week. He reports current drug use. Drug: Marijuana.  ? ?Family History:  ?His family history includes Diabetes in his father and mother.  ? ?Allergies ?Allergies  ?Allergen Reactions  ? Ace Inhibitors Anaphylaxis  ? Black Cherry Fruit Extract [Cherry Extract] Anaphylaxis  ?  Tongue  ? [place] Flavor [Flavoring Agent] Anaphylaxis  ?  ? ?Home Medications  ?Prior to Admission medications   ?Medication Sig Start Date End Date Taking? Authorizing Provider  ?ADVAIR HFA 115-21 MCG/ACT inhaler INHALE 2 PUFFS INTO THE LUNGS 2 (TWO) TIMES DAILY. 07/23/20   [place]ott Rakes, MD  ?albuterol (VENTOLIN HFA) 108 (90 Base) MCG/ACT inhaler Inhale 2 puffs into the lungs every 4 (four) hours as needed for wheezing or shortness of breath. 02/13/21   [place], Dani Gobble, MD  ?Kellie Simmering

## 2021-08-14 NOTE — ED Provider Notes (Signed)
? ?Emergency Department Provider Note ? ? ?I have reviewed the triage vital signs and the nursing notes. ? ? ?HISTORY ? ?Chief Complaint ?Shortness of Breath ? ? ?HPI ?Zachary Hawkins is a 66 y.o. male with past medical history of cardiomyopathy, chronic respiratory failure COPD, hypertension, obstructive sleep apnea, PAF presents with shortness of breath.  EMS reports that they encounter this patient frequently and he often refuses to come to the hospital.  He called out today with shortness of breath.  They administered nebulizer and started him on CPAP.  MSW that he initially refused to come to the hospital but after around 30 minutes of discussion he decided to come in for evaluation.  Patient denies having chest pain or fever.  EMS report that he is intermittently compliant with his home oxygen and is supposed to wear CPAP at night but rarely does.  They also found him with a bottle of pain medication, prescribed recently, but was mostly empty.  ? ?They gave DuoNeb and started him on CPAP.  He also received 125 mg of Solu-Medrol, epinephrine, magnesium. Seems more comfortable on CPAP here.  ? ? ?Past Medical History:  ?Diagnosis Date  ? Asthma   ? BPH (benign prostatic hyperplasia)   ? Cardiomyopathy ([place]) 06/2017  ? EF 40-45%  ? CHF (congestive heart failure) ([place])   ? Chronic renal insufficiency, stage 3 (moderate) (HCC)   ? COPD (chronic obstructive pulmonary disease) ([place])   ? Diastolic dysfunction [id number]  ? grade 2   ? Hyperlipidemia   ? Hypertension   ? Hypertensive urgency   ? Obesity   ? Obesity   ? OSA (obstructive sleep apnea)   ? Paroxysmal atrial fibrillation ([place]) 2022  ? on Eliquis  ? ? ?Review of Systems ? ?Constitutional: No fever/chills ?Eyes: No visual changes. ?ENT: No sore throat. ?Cardiovascular: Denies chest pain. ?Respiratory: Positive shortness of breath. ?Gastrointestinal: No abdominal pain.  No nausea, no vomiting.  No diarrhea.  No constipation. ?Genitourinary: Negative for  dysuria. ?Musculoskeletal: Negative for back pain. ?Skin: Negative for rash. ?Neurological: Negative for headaches. ? ?____________________________________________ ? ? ?PHYSICAL EXAM: ? ?VITAL SIGNS: ? ? ? ?Constitutional: Alert. Some mild increased WOB but overall tolerating CPAP well upon arrival. ?Eyes: Conjunctivae are normal.  ?Head: Atraumatic. ?Nose: No congestion/rhinnorhea. ?Mouth/Throat: Mucous membranes are moist.  ?Neck: No stridor.  ?Cardiovascular: Tachycardia. Good peripheral circulation. Grossly normal heart sounds.   ?Respiratory: Slight increased respiratory effort.  No retractions. Lungs diminished throughout with mild end-expiratory wheezing.  ?Gastrointestinal: Soft and nontender. Positive distention.  ?Musculoskeletal: No lower extremity tenderness with 2+ pitting edema in the LEs. ?Neurologic:  Normal speech and language.  ?Skin:  Skin is warm, dry and intact. No rash noted. ? ?____________________________________________ ?  ?LABS ?(all labs ordered are listed, but only abnormal results are displayed) ? ?Labs Reviewed  ?COMPREHENSIVE METABOLIC PANEL - Abnormal; Notable for the following components:  ?    Result Value  ? Potassium 5.8 (*)   ? Chloride 93 (*)   ? CO2 39 (*)   ? Glucose, Bld 142 (*)   ? Creatinine, Ser 1.61 (*)   ? Calcium 8.6 (*)   ? Albumin 3.4 (*)   ? GFR, Estimated 48 (*)   ? All other components within normal limits  ?CBC WITH DIFFERENTIAL/PLATELET - Abnormal; Notable for the following components:  ? MCHC 28.8 (*)   ? RDW 16.1 (*)   ? nRBC 1.2 (*)   ? Monocytes Absolute 1.4 (*)   ?  Abs Immature Granulocytes 0.21 (*)   ? All other components within normal limits  ?BLOOD GAS, ARTERIAL - Abnormal; Notable for the following components:  ? pH, Arterial 7.2 (*)   ? pCO2 arterial >123 (*)   ? pO2, Arterial 152 (*)   ? Bicarbonate 48.9 (*)   ? Acid-Base Excess 15.1 (*)   ? All other components within normal limits  ?I-STAT VENOUS BLOOD GAS, ED - Abnormal; Notable for the following  components:  ? pH, Ven 7.550 (*)   ? Bicarbonate 44.0 (*)   ? TCO2 46 (*)   ? Acid-Base Excess 19.0 (*)   ? Sodium 132 (*)   ? Potassium 6.1 (*)   ? Calcium, Ion 0.79 (*)   ? All other components within normal limits  ?RESP PANEL BY RT-PCR (FLU A&B, COVID) [id number]  ?URINE CULTURE  ?BRAIN NATRIURETIC PEPTIDE  ?ACETAMINOPHEN LEVEL  ?RAPID URINE DRUG SCREEN, HOSP PERFORMED  ?URINALYSIS, ROUTINE W REFLEX MICROSCOPIC  ?I-STAT ARTERIAL BLOOD GAS, ED  ?TROPONIN I (HIGH SENSITIVITY)  ?TROPONIN I (HIGH SENSITIVITY)  ? ?____________________________________________ ? ?EKG ? ? EKG Interpretation ? ?Date/Time:  Friday August 14 2021 16:48:15 EDT ?Ventricular Rate:  104 ?PR Interval:  166 ?QRS Duration: 87 ?QT Interval:  328 ?QTC Calculation: 432 ?R Axis:   5 ?Text Interpretation: Sinus tachycardia Confirmed by Nanda Quinton [phone]) on 08/14/2021 4:52:12 PM ?  ? ?  ? ? ?____________________________________________ ? ?RADIOLOGY ? ?DG CHEST PORT 1 VIEW ? ?Result Date: 08/14/2021 ?CLINICAL DATA:  Shortness of breath EXAM: PORTABLE CHEST 1 VIEW COMPARISON:  Film from earlier in the same day. FINDINGS: Endotracheal tube and gastric catheter are now seen. The endotracheal tube lies at the level of the carina and should be withdrawn slightly. The overall inspiratory effort is poor. Cardiac shadow is stable. Some crowding of the vascular markings is seen related to the poor inspiratory effort. Bibasilar atelectasis is again noted. IMPRESSION: Tubes and lines as described above. The endotracheal tube should be withdrawn slightly. Stable bibasilar atelectasis. Overall poor inspiratory effort. Electronically Signed   By: Inez Catalina M.D.   On: 08/14/2021 19:36  ? ?DG Chest Portable 1 View ? ?Result Date: 08/14/2021 ?CLINICAL DATA:  Shortness of breath.  Respiratory distress on CPAP. EXAM: PORTABLE CHEST 1 VIEW COMPARISON:  Radiographs 07/18/2021 and 07/17/2021.  CT 05/11/2021. FINDINGS: 1652 hours. Patient remains rotated to the right. There is  stable cardiac enlargement. The mediastinal contours appear unchanged. Bibasilar atelectasis is similar to recent prior studies. There is no overt pulmonary edema, pneumothorax or significant pleural effusion. The bones appear unchanged. Telemetry leads overlie the chest. IMPRESSION: Stable appearance of the chest compared with recent prior studies. Persistent cardiomegaly and bibasilar atelectasis. Electronically Signed   By: Richardean Sale M.D.   On: 08/14/2021 17:05   ? ?____________________________________________ ? ? ?PROCEDURES ? ?Procedure(s) performed:  ? ?.Critical Care ?Performed by: Margette Fast, MD ?Authorized by: Margette Fast, MD  ? ?Critical care provider statement:  ?  Critical care time (minutes):  75 ?  Critical care time was exclusive of:  Separately billable procedures and treating other patients and teaching time ?  Critical care was necessary to treat or prevent imminent or life-threatening deterioration of the following conditions:  Respiratory failure ?  Critical care was time spent personally by me on the following activities:  Development of treatment plan with patient or surrogate, discussions with consultants, evaluation of patient's response to treatment, examination of patient, ordering and review of laboratory studies,  ordering and review of radiographic studies, ordering and performing treatments and interventions, pulse oximetry, re-evaluation of patient's condition, review of old charts, blood draw for specimens, obtaining history from patient or surrogate and ventilator management ?  I assumed direction of critical care for this patient from another provider in my specialty: no   ?  Care discussed with: admitting provider   ? ?____________________________________________ ? ? ?INITIAL IMPRESSION / ASSESSMENT AND PLAN / ED COURSE ? ?Pertinent labs & imaging results that were available during my care of the patient were reviewed by me and considered in my medical decision making  (see chart for details). ?  ?This patient is Presenting for Evaluation of SOB, which does require a range of treatment options, and is a complaint that involves a high risk of morbidity and mortality. ? ?[facility]

## 2021-08-15 ENCOUNTER — Inpatient Hospital Stay (HOSPITAL_COMMUNITY): Payer: Medicaid Other

## 2021-08-15 DIAGNOSIS — J9601 Acute respiratory failure with hypoxia: Secondary | ICD-10-CM | POA: Diagnosis not present

## 2021-08-15 LAB — POCT I-STAT 7, (LYTES, BLD GAS, ICA,H+H)
Acid-Base Excess: 14 mmol/L — ABNORMAL HIGH (ref 0.0–2.0)
Bicarbonate: 42.4 mmol/L — ABNORMAL HIGH (ref 20.0–28.0)
Calcium, Ion: 1.18 mmol/L (ref 1.15–1.40)
HCT: 43 % (ref 39.0–52.0)
Hemoglobin: 14.6 g/dL (ref 13.0–17.0)
O2 Saturation: 92 %
Patient temperature: 97.2
Potassium: 4.6 mmol/L (ref 3.5–5.1)
Sodium: 136 mmol/L (ref 135–145)
TCO2: 44 mmol/L — ABNORMAL HIGH (ref 22–32)
pCO2 arterial: 62.8 mmHg — ABNORMAL HIGH (ref 32–48)
pH, Arterial: 7.434 (ref 7.35–7.45)
pO2, Arterial: 63 mmHg — ABNORMAL LOW (ref 83–108)

## 2021-08-15 LAB — BASIC METABOLIC PANEL
Anion gap: 10 (ref 5–15)
BUN: 21 mg/dL (ref 8–23)
CO2: 37 mmol/L — ABNORMAL HIGH (ref 22–32)
Calcium: 9.2 mg/dL (ref 8.9–10.3)
Chloride: 91 mmol/L — ABNORMAL LOW (ref 98–111)
Creatinine, Ser: 1.61 mg/dL — ABNORMAL HIGH (ref 0.61–1.24)
GFR, Estimated: 48 mL/min — ABNORMAL LOW (ref >60)
Glucose, Bld: 174 mg/dL — ABNORMAL HIGH (ref 70–99)
Potassium: 4.7 mmol/L (ref 3.5–5.1)
Sodium: 138 mmol/L (ref 135–145)

## 2021-08-15 LAB — GLUCOSE, CAPILLARY
Glucose-Capillary: 192 mg/dL — ABNORMAL HIGH (ref 70–99)
Glucose-Capillary: 160 mg/dL — ABNORMAL HIGH (ref 70–99)
Glucose-Capillary: 154 mg/dL — ABNORMAL HIGH (ref 70–99)
Glucose-Capillary: 171 mg/dL — ABNORMAL HIGH (ref 70–99)
Glucose-Capillary: 149 mg/dL — ABNORMAL HIGH (ref 70–99)
Glucose-Capillary: 142 mg/dL — ABNORMAL HIGH (ref 70–99)

## 2021-08-15 LAB — CBC
HCT: 43.4 % (ref 39.0–52.0)
Hemoglobin: 13.2 g/dL (ref 13.0–17.0)
MCH: 29 pg (ref 26.0–34.0)
MCHC: 30.4 g/dL (ref 30.0–36.0)
MCV: 95.4 fL (ref 80.0–100.0)
Platelets: 226 10*3/uL (ref 150–400)
RBC: 4.55 MIL/uL (ref 4.22–5.81)
RDW: 16.1 % — ABNORMAL HIGH (ref 11.5–15.5)
WBC: 9.1 10*3/uL (ref 4.0–10.5)
nRBC: 0.8 % — ABNORMAL HIGH (ref 0.0–0.2)

## 2021-08-15 LAB — PROCALCITONIN: Procalcitonin: <0.1 ng/mL

## 2021-08-15 LAB — TRIGLYCERIDES: Triglycerides: 116 mg/dL (ref <150)

## 2021-08-15 MED ORDER — DOXAZOSIN MESYLATE 2 MG PO TABS
2.0000 mg | ORAL_TABLET | Freq: Every day | ORAL | Status: DC
  Administered 2021-08-15 – 2021-08-18 (×4): 2 mg
  Filled 2021-08-15 (×4): qty 1

## 2021-08-15 NOTE — Progress Notes (Signed)
Pt's wallet checked in with security. Contents of wallet documented in security belongings slip and placed in chart.  ?

## 2021-08-15 NOTE — Progress Notes (Signed)
? ?NAME:  Zachary Hawkins, MRN:  [id number], DOB:  [birth year], LOS: 1 ?ADMISSION DATE:  08/14/2021, CONSULTATION DATE:  4/7 ?REFERRING MD:  Dr. Laverta Baltimore, CHIEF COMPLAINT:  COPD exacerbation  ? ?History of Present Illness:  ?Patient is a 66 year old male with pertinent PMH of HFrEF with cardiomyopathy, COPD on 4 LNC at home, OSA, HTN, PAF presents to [facility] ED on 4/7 with SOB/AMS. ? ?EMS called to patient's house on 4/7 due to patient having SOB.  Upon arrival EMS states that patient sats were 50% and patient was altered found on room air.  Patient was also found with a bottle of pain medicine which was mostly empty. Patient given DuoNebs, 125 Solu-Medrol, epi, mag, and started on CPAP.  Initially EMS states that the patient refused to come to hospital but after 30 minutes of discussion decided to come in for further evaluation.  EMS states that he is intermittently compliant with home oxygen and is supposed to wear CPAP but never does. Patient transported to [facility] ED. ? ?Patient arrived to [facility] ED on 4/7.  Patient denies chest pain/fever.  Patient's breathing is initially appeared more comfortable on CPAP and sats stable.  Afebrile and BP mildly hypertensive.  EKG with sinus tach rate 104.  CXR appears unchanged from previous CXRs, showing stable appearance of chest with percent stent cardiomegaly and bibasilar atelectasis.  BNP 19.5.  COVID flu negative.  Initial VBG 7.55, CO2 50, PO2 45, HCO3 44.  K 5.8, glucose 142, BUN 18, creatinine 1.61 (baseline 1.5).  Troponin 16.  WBC 9.8, Hgb 13.8.  Patient's mental status or altered.  Repeat ABG showing pH 7.2, CO2 greater 123, PO2 152, HCO3 48.  Patient intubated in ED.  PCCM consulted for ICU admission. ? ? ?Pertinent  Medical History  ? ?Past Medical History:  ?Diagnosis Date  ? Asthma   ? BPH (benign prostatic hyperplasia)   ? Cardiomyopathy ([place]) 06/2017  ? EF 40-45%  ? CHF (congestive heart failure) ([place])   ? Chronic renal insufficiency, stage 3 (moderate) (HCC)   ? COPD  (chronic obstructive pulmonary disease) ([place])   ? Diastolic dysfunction [id number]  ? grade 2   ? Hyperlipidemia   ? Hypertension   ? Hypertensive urgency   ? Obesity   ? Obesity   ? OSA (obstructive sleep apnea)   ? Paroxysmal atrial fibrillation ([place]) 2022  ? on Eliquis  ? ? ? ?Significant Hospital Events: ?Including procedures, antibiotic start and stop dates in addition to other pertinent events   ?4/7: admitted to [facility] for copd exacerbation resp failure; intubated ?4/8: Stable overnight ? ?Interim History / Subjective:  ?On vent ?FiO2 being weaned ?Still sedated ? ?Objective   ?Blood pressure 114/76, pulse 83, temperature 98.7 ?F (37.1 ?C), temperature source Oral, resp. rate 20, weight (!) 155.7 kg, SpO2 100 %. ?   ?Vent Mode: PRVC ?FiO2 (%):  [100 %] 100 % ?Set Rate:  [18 bmp-20 bmp] 20 bmp ?Vt Set:  [560 mL-620 mL] 560 mL ?PEEP:  [5 cmH20-8 cmH20] 8 cmH20 ?Plateau Pressure:  [27 cmH20-30 cmH20] 27 cmH20  ? ?Intake/Output Summary (Last 24 hours) at 08/15/2021 0824 ?Last data filed at 08/15/2021 0701 ?Gross per 24 hour  ?Intake 607.05 ml  ?Output --  ?Net 607.05 ml  ? ?Filed Weights  ? 08/15/21 0342  ?Weight: (!) 155.7 kg  ? ? ?Examination: ?General: Chronically ill-appearing, obese  ?HEENT: Endotracheal tube in place, moist oral mucosa ?Neuro: Sedated, not following commands ?CV: S1-S2 appreciated, no murmur ?  PULM: Decreased breath sounds bilaterally ?GI: Bowel sounds appreciated ?Extremities: Bilateral lower extremity edema ?Skin: no rashes or lesions appreciated ? ? ?Resolved Hospital Problem list   ? ? ?Assessment & Plan:  ? ?Acute on chronic hypoxemic/hypercapnic respiratory failure ?History of obstructive lung disease ?-Continue ventilator support ?-Target TVol 6-8cc/kgIBW ?-Target Plateau Pressure < 30cm H20 ?-Target driving pressure less than 15 cm of water ?Ventilator associated pneumonia prevention protocol ? ?-Bronchodilator treatments with DuoNeb and Pulmicort ?-IV steroids ? ?History of obstructive sleep  apnea ?-Patient is noncompliant with CPAP ? ?Sedation to goal of RASS 0 to -1 ? ?Acute metabolic encephalopathy ?-This is secondary to CO2 retention ?-CO2 was greater than 120 ? ?-CT head unremarkable for new findings, sinus congestion ? ?-Neurochecks once off sedation ? ?UDS is pending ?Alcohol and salicylate level within normal limits ? ?Chronic diastolic heart failure ?-Home meds include torsemide, spironolactone, Farxiga, amlodipine, bisoprolol ?-Bisoprolol resumed ?-We will reinitiate other meds as appropriate ? ?Hypertension, hyperlipidemia ?-As needed hydralazine for hypertension ? ? ?Paroxysmal atrial fibrillation ?-On Eliquis ?-On bisoprolol ? ? ?Chronic kidney disease stage III ?Hyperkalemia ?-Avoid nephrotoxic agents ?-Ensure adequate renal perfusion by maintaining MAP greater than 65 ?-Continue to trend electrolytes ?-Hyperkalemia resolved ? ?BPH ?P: ?-Tamsulosin ? ? ?Best Practice (right click and "Reselect all SmartList Selections" daily)  ? ?Diet/type: NPO w/ meds via tube ?DVT prophylaxis: DOAC ?GI prophylaxis: PPI ?Lines: N/A ?Foley:  N/A ?Code Status:  full code ?Last date of multidisciplinary goals of care discussion [pending] ? ?Labs   ?CBC: ?Recent Labs  ?Lab 08/14/21 ?1648 08/14/21 ?1655 08/14/21 ?2055 08/15/21 ?[id number]  ?WBC  --  9.8  --  9.1  ?NEUTROABS  --  5.2  --   --   ?HGB 15.3 13.8 15.6 13.2  ?HCT 45.0 47.9 46.0 43.4  ?MCV  --  99.4  --  95.4  ?PLT  --  218  --  226  ? ? ?Basic Metabolic Panel: ?Recent Labs  ?Lab 08/14/21 ?1648 08/14/21 ?1655 08/14/21 ?2055 08/14/21 ?2241 08/15/21 ?[id number]  ?NA 132* 138 136 139 138  ?K 6.1* 5.8* 4.9 4.7 4.7  ?CL  --  93*  --  90* 91*  ?CO2  --  39*  --  38* 37*  ?GLUCOSE  --  142*  --  183* 174*  ?BUN  --  18  --  18 21  ?CREATININE  --  1.61*  --  1.52* 1.61*  ?CALCIUM  --  8.6*  --  9.0 9.2  ?MG  --   --   --  2.7*  --   ? ?GFR: ?Estimated Creatinine Clearance: 73.2 mL/min (A) (by C-G formula based on SCr of 1.61 mg/dL (H)). ?Recent Labs  ?Lab  08/14/21 ?1655 08/14/21 ?2241 08/15/21 ?[id number]  ?PROCALCITON  --  <0.10  --   ?WBC 9.8  --  9.1  ? ? ?Liver Function Tests: ?Recent Labs  ?Lab 08/14/21 ?1655  ?AST 28  ?ALT 18  ?ALKPHOS 67  ?BILITOT 1.1  ?PROT 7.2  ?ALBUMIN 3.4*  ? ?No results for input(s): LIPASE, AMYLASE in the last 168 hours. ?Recent Labs  ?Lab 08/14/21 ?2241  ?AMMONIA 42*  ? ? ?ABG ?   ?Component Value Date/Time  ? PHART 7.387 08/14/2021 2055  ? PCO2ART 73.2 ([place]) 08/14/2021 2055  ? PO2ART 69 (L) 08/14/2021 2055  ? HCO3 43.7 (H) 08/14/2021 2055  ? TCO2 46 (H) 08/14/2021 2055  ? ACIDBASEDEF 2.0 03/12/2021 0557  ? O2SAT 92 08/14/2021 2055  ?  ? ?  Coagulation Profile: ?No results for input(s): INR, PROTIME in the last 168 hours. ? ?Cardiac Enzymes: ?No results for input(s): CKTOTAL, CKMB, CKMBINDEX, TROPONINI in the last 168 hours. ? ?HbA1C: ?No results found for: HGBA1C ? ?CBG: ?Recent Labs  ?Lab 08/14/21 ?2233 08/15/21 ?[id number] 08/15/21 ?[id number]  ?GLUCAP 183* 192* 160*  ? ? ?Review of Systems:   ?Patient is encephalopathic and/or intubated. Therefore history has been obtained from chart review.  ? ? ?Past Medical History:  ?He,  has a past medical history of Asthma, BPH (benign prostatic hyperplasia), Cardiomyopathy ([place]) (06/2017), CHF (congestive heart failure) ([place]), Chronic renal insufficiency, stage 3 (moderate) ([place]), COPD (chronic obstructive pulmonary disease) ([place]), Diastolic dysfunction ([id number]), Hyperlipidemia, Hypertension, Hypertensive urgency, Obesity, Obesity, OSA (obstructive sleep apnea), and Paroxysmal atrial fibrillation ([place]) (2022).  ? ?Surgical History:  ?No past surgical history on file.  ? ?Social History:  ? reports that he quit smoking about 7 months ago. His smoking use included cigarettes. He has a 36.00 pack-year smoking history. He has never used smokeless tobacco. He reports current alcohol use of about 1.0 standard drink per week. He reports current drug use. Drug: Marijuana.  ? ?Family History:  ?His family history  includes Diabetes in his father and mother.  ? ?Allergies ?Allergies  ?Allergen Reactions  ? Ace Inhibitors Anaphylaxis  ? Black Cherry Fruit Extract [Cherry Extract] Anaphylaxis  ?  Tongue  ? [place] Flavor [Flavoring Agent]

## 2021-08-16 DIAGNOSIS — J9601 Acute respiratory failure with hypoxia: Secondary | ICD-10-CM | POA: Diagnosis not present

## 2021-08-16 LAB — PHOSPHORUS: Phosphorus: 5.7 mg/dL — ABNORMAL HIGH (ref 2.5–4.6)

## 2021-08-16 LAB — GLUCOSE, CAPILLARY
Glucose-Capillary: 164 mg/dL — ABNORMAL HIGH (ref 70–99)
Glucose-Capillary: 144 mg/dL — ABNORMAL HIGH (ref 70–99)
Glucose-Capillary: 144 mg/dL — ABNORMAL HIGH (ref 70–99)
Glucose-Capillary: 178 mg/dL — ABNORMAL HIGH (ref 70–99)
Glucose-Capillary: 125 mg/dL — ABNORMAL HIGH (ref 70–99)
Glucose-Capillary: 156 mg/dL — ABNORMAL HIGH (ref 70–99)

## 2021-08-16 LAB — MAGNESIUM: Magnesium: 2.9 mg/dL — ABNORMAL HIGH (ref 1.7–2.4)

## 2021-08-16 LAB — PROCALCITONIN: Procalcitonin: <0.1 ng/mL

## 2021-08-16 MED ORDER — VITAL HIGH PROTEIN PO LIQD
1000.0000 mL | ORAL | Status: DC
  Administered 2021-08-16: 1000 mL

## 2021-08-16 MED ORDER — PROSOURCE TF PO LIQD
45.0000 mL | Freq: Two times a day (BID) | ORAL | Status: DC
  Administered 2021-08-16 – 2021-08-17 (×2): 45 mL
  Filled 2021-08-16 (×3): qty 45

## 2021-08-16 MED ORDER — INSULIN ASPART 100 UNIT/ML IJ SOLN
0.0000 [IU] | INTRAMUSCULAR | Status: DC
  Administered 2021-08-16: 1 [IU] via SUBCUTANEOUS

## 2021-08-16 MED ORDER — INSULIN ASPART 100 UNIT/ML IJ SOLN
0.0000 [IU] | INTRAMUSCULAR | Status: DC
  Administered 2021-08-16 – 2021-08-17 (×3): 2 [IU] via SUBCUTANEOUS
  Administered 2021-08-17: 1 [IU] via SUBCUTANEOUS
  Administered 2021-08-17 (×2): 2 [IU] via SUBCUTANEOUS
  Administered 2021-08-17: 1 [IU] via SUBCUTANEOUS
  Administered 2021-08-18: 2 [IU] via SUBCUTANEOUS
  Administered 2021-08-18 – 2021-08-19 (×4): 1 [IU] via SUBCUTANEOUS

## 2021-08-16 NOTE — Progress Notes (Signed)
? ?NAME:  Zachary Hawkins, MRN:  [id number], DOB:  [birth year], LOS: 2 ?ADMISSION DATE:  08/14/2021, CONSULTATION DATE:  4/7 ?REFERRING MD:  Dr. Laverta Baltimore, CHIEF COMPLAINT:  COPD exacerbation  ? ?History of Present Illness:  ?Patient is a 66 year old male with pertinent PMH of HFrEF with cardiomyopathy, COPD on 4 LNC at home, OSA, HTN, PAF presents to [facility] ED on 4/7 with SOB/AMS. ? ?EMS called to patient's house on 4/7 due to patient having SOB.  Upon arrival EMS states that patient sats were 50% and patient was altered found on room air.  Patient was also found with a bottle of pain medicine which was mostly empty. Patient given DuoNebs, 125 Solu-Medrol, epi, mag, and started on CPAP.  Initially EMS states that the patient refused to come to hospital but after 30 minutes of discussion decided to come in for further evaluation.  EMS states that he is intermittently compliant with home oxygen and is supposed to wear CPAP but never does. Patient transported to [facility] ED. ? ?Patient arrived to [facility] ED on 4/7.  Patient denies chest pain/fever.  Patient's breathing is initially appeared more comfortable on CPAP and sats stable.  Afebrile and BP mildly hypertensive.  EKG with sinus tach rate 104.  CXR appears unchanged from previous CXRs, showing stable appearance of chest with percent stent cardiomegaly and bibasilar atelectasis.  BNP 19.5.  COVID flu negative.  Initial VBG 7.55, CO2 50, PO2 45, HCO3 44.  K 5.8, glucose 142, BUN 18, creatinine 1.61 (baseline 1.5).  Troponin 16.  WBC 9.8, Hgb 13.8.  Patient's mental status or altered.  Repeat ABG showing pH 7.2, CO2 greater 123, PO2 152, HCO3 48.  Patient intubated in ED.  PCCM consulted for ICU admission. ? ?Pertinent  Medical History  ? ?Past Medical History:  ?Diagnosis Date  ? Asthma   ? BPH (benign prostatic hyperplasia)   ? Cardiomyopathy ([place]) 06/2017  ? EF 40-45%  ? CHF (congestive heart failure) ([place])   ? Chronic renal insufficiency, stage 3 (moderate) (HCC)   ? COPD (chronic  obstructive pulmonary disease) ([place])   ? Diastolic dysfunction [id number]  ? grade 2   ? Hyperlipidemia   ? Hypertension   ? Hypertensive urgency   ? Obesity   ? Obesity   ? OSA (obstructive sleep apnea)   ? Paroxysmal atrial fibrillation ([place]) 2022  ? on Eliquis  ? ?Significant Hospital Events: ?Including procedures, antibiotic start and stop dates in addition to other pertinent events   ?4/7: admitted to [facility] for copd exacerbation resp failure; intubated ?4/8: Stable overnight ? ?Interim History / Subjective:  ?No overnight events ?Agitated with decreasing sedation ? ?Objective   ?Blood pressure 115/72, pulse 78, temperature 98.5 ?F (36.9 ?C), temperature source Oral, resp. rate 20, height 6\' 1"  (1.854 m), weight (!) 157 kg, SpO2 95 %. ?   ?Vent Mode: PRVC ?FiO2 (%):  [40 %-50 %] 40 % ?Set Rate:  [20 bmp] 20 bmp ?Vt Set:  [560 mL] 560 mL ?PEEP:  [8 cmH20] 8 cmH20 ?Plateau Pressure:  [24 cmH20-29 cmH20] 24 cmH20  ? ?Intake/Output Summary (Last 24 hours) at 08/16/2021 1444 ?Last data filed at 08/16/2021 1300 ?Gross per 24 hour  ?Intake 1583.76 ml  ?Output 1600 ml  ?Net -16.24 ml  ? ?Filed Weights  ? 08/15/21 0342 08/16/21 0454  ?Weight: (!) 155.7 kg (!) 157 kg  ? ? ?Examination: ?General: Chronically ill-appearing, morbidly obese ?HEENT: Endotracheal tube in place, moist oral mucosa ?Neuro: Patient is sedated,  not following commands, moving all extremities ?CV: S1-S2 appreciated, no murmur ?PULM: Decreased breath sounds bilaterally ?GI: Bowel sounds appreciated ?Extremities: Bilateral lower extremity edema ?Skin: no rashes or lesions appreciated ? ? ?Resolved Hospital Problem list   ? ? ?Assessment & Plan:  ? ?Acute on chronic hypoxemic/hypercapnic respiratory failure ?History of obstructive lung disease ?-Continue ventilator support ?-Target TVol 6-8cc/kgIBW ?-Target Plateau Pressure < 30cm H20 ?-Target driving pressure less than 15 cm of water ?Ventilator associated pneumonia prevention protocol ? ?Obstructive lung  disease ?-Continue DuoNeb, Pulmicort ?-IV steroids ? ?History of obstructive sleep apnea ?-Noncompliant with CPAP ? ?Acute metabolic encephalopathy ?-Likely secondary to hypercapnia ?-CT head unremarkable, some sinus congestion ?-Alcohol and salicylate levels within normal limits ? ?Chronic diastolic heart failure ?-Home medications on hold including torsemide, spironolactone, Farxiga, amlodipine ?-Bisoprolol continued ? ?Paroxysmal atrial fibrillation ?-On Eliquis ?-On bisoprolol ? ?Chronic kidney disease stage III ?Hypokalemia ?-Avoid nephrotoxic agents ?-Ensure adequate renal perfusion by maintaining MAP greater than 65 ?-Continue to trend electrolytes ?-Hypokalemia resolved ? ?BPH ?-On Cardura ? ?Best Practice (right click and "Reselect all SmartList Selections" daily)  ? ?Diet/type: tubefeeds ?DVT prophylaxis: DOAC ?GI prophylaxis: PPI ?Lines: N/A ?Foley:  N/A ?Code Status:  full code ?Last date of multidisciplinary goals of care discussion [pending] ? ?Labs   ?CBC: ?Recent Labs  ?Lab 08/14/21 ?1648 08/14/21 ?1655 08/14/21 ?2055 08/15/21 ?[id number] 08/15/21 ?1036  ?WBC  --  9.8  --  9.1  --   ?NEUTROABS  --  5.2  --   --   --   ?HGB 15.3 13.8 15.6 13.2 14.6  ?HCT 45.0 47.9 46.0 43.4 43.0  ?MCV  --  99.4  --  95.4  --   ?PLT  --  218  --  226  --   ? ? ?Basic Metabolic Panel: ?Recent Labs  ?Lab 08/14/21 ?1655 08/14/21 ?2055 08/14/21 ?2241 08/15/21 ?[id number] 08/15/21 ?1036  ?NA 138 136 139 138 136  ?K 5.8* 4.9 4.7 4.7 4.6  ?CL 93*  --  90* 91*  --   ?CO2 39*  --  38* 37*  --   ?GLUCOSE 142*  --  183* 174*  --   ?BUN 18  --  18 21  --   ?CREATININE 1.61*  --  1.52* 1.61*  --   ?CALCIUM 8.6*  --  9.0 9.2  --   ?MG  --   --  2.7*  --   --   ? ?GFR: ?Estimated Creatinine Clearance: 73.5 mL/min (A) (by C-G formula based on SCr of 1.61 mg/dL (H)). ?Recent Labs  ?Lab 08/14/21 ?1655 08/14/21 ?2241 08/15/21 ?[id number] 08/16/21 ?[id number]  ?PROCALCITON  --  <0.10 <0.10 <0.10  ?WBC 9.8  --  9.1  --   ? ? ?Liver Function Tests: ?Recent Labs   ?Lab 08/14/21 ?1655  ?AST 28  ?ALT 18  ?ALKPHOS 67  ?BILITOT 1.1  ?PROT 7.2  ?ALBUMIN 3.4*  ? ?No results for input(s): LIPASE, AMYLASE in the last 168 hours. ?Recent Labs  ?Lab 08/14/21 ?2241  ?AMMONIA 42*  ? ? ?ABG ?   ?Component Value Date/Time  ? PHART 7.434 08/15/2021 1036  ? PCO2ART 62.8 (H) 08/15/2021 1036  ? PO2ART 63 (L) 08/15/2021 1036  ? HCO3 42.4 (H) 08/15/2021 1036  ? TCO2 44 (H) 08/15/2021 1036  ? ACIDBASEDEF 2.0 03/12/2021 0557  ? O2SAT 92 08/15/2021 1036  ?  ? ?Coagulation Profile: ?No results for input(s): INR, PROTIME in the last 168 hours. ? ?Cardiac Enzymes: ?No results for  input(s): CKTOTAL, CKMB, CKMBINDEX, TROPONINI in the last 168 hours. ? ?HbA1C: ?No results found for: HGBA1C ? ?CBG: ?Recent Labs  ?Lab 08/15/21 ?1925 08/15/21 ?2314 08/16/21 ?0316 08/16/21 ?0750 08/16/21 ?1114  ?GLUCAP 149* 142* 164* 144* 144*  ? ? ?Review of Systems:   ?Patient is encephalopathic and/or intubated. Therefore history has been obtained from chart review.  ? ? ?Past Medical History:  ?He,  has a past medical history of Asthma, BPH (benign prostatic hyperplasia), Cardiomyopathy ([place]) (06/2017), CHF (congestive heart failure) ([place]), Chronic renal insufficiency, stage 3 (moderate) ([place]), COPD (chronic obstructive pulmonary disease) ([place]), Diastolic dysfunction ([id number]), Hyperlipidemia, Hypertension, Hypertensive urgency, Obesity, Obesity, OSA (obstructive sleep apnea), and Paroxysmal atrial fibrillation ([place]) (2022).  ? ?Surgical History:  ?No past surgical history on file.  ? ?Social History:  ? reports that he quit smoking about 7 months ago. His smoking use included cigarettes. He has a 36.00 pack-year smoking history. He has never used smokeless tobacco. He reports current alcohol use of about 1.0 standard drink per week. He reports current drug use. Drug: Marijuana.  ? ?Family History:  ?His family history includes Diabetes in his father and mother.  ? ?Allergies ?Allergies  ?Allergen Reactions  ? Ace  Inhibitors Anaphylaxis  ? Black Cherry Fruit Extract [Cherry Extract] Anaphylaxis  ?  Tongue  ? [place] Flavor [Flavoring Agent] Anaphylaxis  ?  ? ?Home Medications  ?Prior to Admission medications   ?Medication

## 2021-08-16 NOTE — Progress Notes (Deleted)
? ?Synopsis: Referred for decreased O2 saturation, restrictive lung disease, dyspnea by No ref. provider found ? ?Subjective:  ? ?PATIENT ID: Zachary Hawkins GENDER: male DOB: 04-May-1956, MRN: [id number] ? ?No chief complaint on file. ? ?89yM with history of CHF (EF 40-45% now recovered, G2DD -> G1DD), moderate OSA not on CPAP since he couldn't afford it, AF on eliquis, obesity, smoking 15-20 years half ppd quit 01/04/21, ACEi angioedema 10/2017, anaphylaxis due to black cherries requiring hospitalization and intubation dc'd home with steroid taper 9/2, recent discharge from ICU 9/10 after giving himself an epi pen injection 9/7 after he felt his throat closing up after dinner. He was referred to allergy/immunology.  ? ?He has some DOE. Taking advair one puff BID. No cough especially since stopping smoking.  ? ?He never had any itching during either of the above episodes. Tryptase was negative.  ? ?He is finishing course of steroids.  ? ?Snores. Some PND. Some daytime sleepiness.  ? ?He had an episode yesterday where home oxygen cut off and it worried him.  ? ?Sister has OSA ? 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  ? ?Interval HPI: ?Last seen by me 05/07/21, went to ED 05/11/21, given prednisone/doxy. To cardiology 05/15/21 where he was 12 lb over dry weight (322).  ? ?Seen in follow up today after hospitalization for volume overload. ? ?He is using his trilogy every night but maximum 3h. On and off of it overnight, mostly due to nose dryness and crusting. He says RT that came to house did not help with humidification.  ? ?Otherwise pertinent review of systems is negative. ? ? ? ? ?Past Medical History:  ?Diagnosis Date  ? Asthma   ? BPH (benign prostatic hyperplasia)   ? Cardiomyopathy ([place]) 06/2017  ? EF 40-45%  ? CHF (congestive heart failure) ([place])   ? Chronic renal insufficiency, stage 3 (moderate) (HCC)   ? COPD (chronic obstructive pulmonary disease) ([place])   ? Diastolic dysfunction 32/6712   ? grade 2   ? Hyperlipidemia   ? Hypertension   ? Hypertensive urgency   ? Obesity   ? Obesity   ? OSA (obstructive sleep apnea)   ? Paroxysmal atrial fibrillation ([place]) 2022  ? on Eliquis  ?  ? ?Family History  ?Problem Relation Age of Onset  ? Diabetes Mother   ? Diabetes Father   ?  ? ?No past surgical history on file. ? ?Social History  ? ?Socioeconomic History  ? Marital status: Single  ?  Spouse name: Not on file  ? Number of children: Not on file  ? Years of education: Not on file  ? Highest education level: Not on file  ?Occupational History  ? Not on file  ?Tobacco Use  ? Smoking status: Former  ?  Packs/day: 1.00  ?  Years: 36.00  ?  Pack years: 36.00  ?  Types: Cigarettes  ?  Quit date: 01/08/2021  ?  Years since quitting: 0.6  ? Smokeless tobacco: Never  ?Substance and Sexual Activity  ? Alcohol use: Yes  ?  Alcohol/week: 1.0 standard drink  ?  Types: 1 Shots of liquor per week  ?  Comment: socially  ? Drug use: Yes  ?  Types: Marijuana  ?  Comment: every other week  ? Sexual activity: Not on file  ?Other Topics Concern  ? Not on file  ?Social History Narrative  ? Not on file  ? ?Social Determinants of Health  ? ?  Financial Resource Strain: High Risk  ? Difficulty of Paying Living Expenses: Very hard  ?Food Insecurity: No Food Insecurity  ? Worried About Charity fundraiser in the Last Year: Never true  ? Ran Out of Food in the Last Year: Never true  ?Transportation Needs: No Transportation Needs  ? Lack of Transportation (Medical): No  ? Lack of Transportation (Non-Medical): No  ?Physical Activity: Not on file  ?Stress: Not on file  ?Social Connections: Not on file  ?Intimate Partner Violence: Not on file  ?  ? ?Allergies  ?Allergen Reactions  ? Ace Inhibitors Anaphylaxis  ? Black Cherry Fruit Extract [Cherry Extract] Anaphylaxis  ?  Tongue  ? [place] Flavor [Flavoring Agent] Anaphylaxis  ?  ? ?Facility-Administered Medications Prior to Visit  ?Medication Dose Route Frequency Provider Last Rate Last  Admin  ? apixaban (ELIQUIS) tablet 5 mg  5 mg Per Tube BID Andres Labrum D, PA-C   5 mg at 08/16/21 8182  ? atorvastatin (LIPITOR) tablet 10 mg  10 mg Per Tube Daily Mick Sell, PA-C   10 mg at 08/16/21 9937  ? bisoprolol (ZEBETA) tablet 5 mg  5 mg Per Tube Daily Mick Sell, PA-C   5 mg at 08/16/21 1696  ? budesonide (PULMICORT) nebulizer solution 0.25 mg  0.25 mg Nebulization BID Mick Sell, PA-C   0.25 mg at 08/16/21 0745  ? chlorhexidine gluconate (MEDLINE KIT) (PERIDEX) 0.12 % solution 15 mL  15 mL Mouth Rinse BID Collier Bullock, MD   15 mL at 08/16/21 0800  ? Chlorhexidine Gluconate Cloth 2 % PADS 6 each  6 each Topical Daily Collier Bullock, MD   6 each at 08/16/21 1303  ? docusate (COLACE) 50 MG/5ML liquid 100 mg  100 mg Per Tube BID Mick Sell, PA-C   100 mg at 08/15/21 2128  ? doxazosin (CARDURA) tablet 2 mg  2 mg Per Tube Daily Olalere, Adewale A, MD   2 mg at 08/16/21 0947  ? feeding supplement (PROSource TF) liquid 45 mL  45 mL Per Tube BID Olalere, Adewale A, MD      ? feeding supplement (VITAL HIGH PROTEIN) liquid 1,000 mL  1,000 mL Per Tube Q24H Olalere, Adewale A, MD      ? fentaNYL (SUBLIMAZE) bolus via infusion 50-100 mcg  50-100 mcg Intravenous Q15 min PRN Andres Labrum D, PA-C   100 mcg at 08/15/21 2131  ? fentaNYL 2551mg in NS 2570m(1070mml) infusion-PREMIX  50-200 mcg/hr Intravenous Continuous PayMick SellA-C 15 mL/hr at 08/16/21 1317 150 mcg/hr at 08/16/21 1317  ? hydrALAZINE (APRESOLINE) injection 10-40 mg  10-40 mg Intravenous Q4H PRN PayMick SellA-C      ? ipratropium-albuterol (DUONEB) 0.5-2.5 (3) MG/3ML nebulizer solution 3 mL  3 mL Nebulization Q4H PayAndres Labrum PA-C   3 mL at 08/16/21 1527  ? ipratropium-albuterol (DUONEB) 0.5-2.5 (3) MG/3ML nebulizer solution 3 mL  3 mL Nebulization Q2H PRN PayMick SellA-C      ? MEDLINE mouth rinse  15 mL Mouth Rinse 10 times per day GonCollier BullockD   15 mL at 08/16/21 1353  ? methylPREDNISolone sodium succinate  (SOLU-MEDROL) 125 mg/2 mL injection 80 mg  80 mg Intravenous Q12H PayMick SellA-C   80 mg at 08/16/21 0900  ? ondansetron (ZOFRAN) injection 4 mg  4 mg Intravenous Q6H PRN GonCollier BullockD      ? pantoprazole sodium (PROTONIX) 40 mg/20 mL oral suspension  40 mg  40 mg Per Tube Daily Collier Bullock, MD   40 mg at 08/16/21 0947  ? polyethylene glycol (MIRALAX / GLYCOLAX) packet 17 g  17 g Per Tube Daily Mick Sell, PA-C   17 g at 08/16/21 3437  ? propofol (DIPRIVAN) 1000 MG/100ML infusion  0-50 mcg/kg/min Intravenous Continuous Mick Sell, PA-C 37.7 mL/hr at 08/16/21 1318 40 mcg/kg/min at 08/16/21 1318  ? ?Outpatient Medications Prior to Visit  ?Medication Sig Dispense Refill  ? ADVAIR HFA 115-21 MCG/ACT inhaler INHALE 2 PUFFS INTO THE LUNGS 2 (TWO) TIMES DAILY. 36 g 12  ? albuterol (VENTOLIN HFA) 108 (90 Base) MCG/ACT inhaler Inhale 2 puffs into the lungs every 4 (four) hours as needed for wheezing or shortness of breath. 18 g 0  ? amLODipine (NORVASC) 5 MG tablet Take 1 tablet (5 mg total) by mouth daily. 30 tablet 0  ? apixaban (ELIQUIS) 5 MG TABS tablet Take 1 tablet (5 mg total) by mouth 2 (two) times daily. 60 tablet 5  ? atorvastatin (LIPITOR) 10 MG tablet Take 10 mg by mouth daily.    ? bisoprolol (ZEBETA) 5 MG tablet Take 1 tablet (5 mg total) by mouth daily. 90 tablet 3  ? dapagliflozin propanediol (FARXIGA) 10 MG TABS tablet Take 1 tablet (10 mg total) by mouth daily. 30 tablet 0  ? EPINEPHrine 0.3 mg/0.3 mL IJ SOAJ injection Inject 0.3 mg into the muscle as needed for anaphylaxis. (Patient taking differently: Inject 0.3 mg into the muscle once as needed for anaphylaxis.) 1 each 1  ? famotidine (PEPCID) 20 MG tablet Take 1 tablet (20 mg total) by mouth 2 (two) times daily. 28 tablet 0  ? fluticasone (FLONASE) 50 MCG/ACT nasal spray Place 1 spray into both nostrils daily. 16 g 2  ? Multiple Vitamin (MULTIVITAMIN WITH MINERALS) TABS tablet Take 1 tablet by mouth daily.    ? potassium chloride  SA (KLOR-CON M) 20 MEQ tablet Take 1 tablet (20 mEq total) by mouth 2 (two) times daily. 180 tablet 1  ? saline (AYR) GEL Place 1 application into both nostrils at bedtime. 14.1 g 11  ? spironolactone (ALDACTONE

## 2021-08-17 ENCOUNTER — Inpatient Hospital Stay (HOSPITAL_COMMUNITY): Payer: Medicaid Other

## 2021-08-17 DIAGNOSIS — J9601 Acute respiratory failure with hypoxia: Secondary | ICD-10-CM | POA: Diagnosis not present

## 2021-08-17 DIAGNOSIS — J441 Chronic obstructive pulmonary disease with (acute) exacerbation: Secondary | ICD-10-CM | POA: Diagnosis not present

## 2021-08-17 LAB — GLUCOSE, CAPILLARY
Glucose-Capillary: 151 mg/dL — ABNORMAL HIGH (ref 70–99)
Glucose-Capillary: 138 mg/dL — ABNORMAL HIGH (ref 70–99)
Glucose-Capillary: 175 mg/dL — ABNORMAL HIGH (ref 70–99)
Glucose-Capillary: 159 mg/dL — ABNORMAL HIGH (ref 70–99)
Glucose-Capillary: 128 mg/dL — ABNORMAL HIGH (ref 70–99)
Glucose-Capillary: 164 mg/dL — ABNORMAL HIGH (ref 70–99)

## 2021-08-17 LAB — MAGNESIUM: Magnesium: 3.1 mg/dL — ABNORMAL HIGH (ref 1.7–2.4)

## 2021-08-17 LAB — PHOSPHORUS: Phosphorus: 6.5 mg/dL — ABNORMAL HIGH (ref 2.5–4.6)

## 2021-08-17 MED ORDER — ACETAZOLAMIDE 250 MG PO TABS
250.0000 mg | ORAL_TABLET | Freq: Two times a day (BID) | ORAL | Status: DC
  Administered 2021-08-17 (×2): 250 mg
  Filled 2021-08-17 (×3): qty 1

## 2021-08-17 MED ORDER — FLUTICASONE PROPIONATE 50 MCG/ACT NA SUSP
2.0000 | Freq: Every day | NASAL | Status: DC
  Administered 2021-08-17 – 2021-08-21 (×5): 2 via NASAL
  Filled 2021-08-17: qty 16

## 2021-08-17 MED ORDER — SALINE SPRAY 0.65 % NA SOLN
2.0000 | NASAL | Status: DC | PRN
  Administered 2021-08-17 – 2021-08-18 (×2): 2 via NASAL
  Filled 2021-08-17: qty 44

## 2021-08-17 MED ORDER — SORBITOL 70 % SOLN
960.0000 mL | TOPICAL_OIL | Freq: Once | ORAL | Status: AC
  Administered 2021-08-17: 960 mL via RECTAL
  Filled 2021-08-17: qty 473

## 2021-08-17 MED ORDER — IPRATROPIUM-ALBUTEROL 0.5-2.5 (3) MG/3ML IN SOLN
3.0000 mL | Freq: Three times a day (TID) | RESPIRATORY_TRACT | Status: DC
  Administered 2021-08-17 – 2021-08-21 (×11): 3 mL via RESPIRATORY_TRACT
  Filled 2021-08-17 (×11): qty 3

## 2021-08-17 MED ORDER — HALOPERIDOL LACTATE 5 MG/ML IJ SOLN
2.0000 mg | INTRAMUSCULAR | Status: DC | PRN
  Administered 2021-08-17: 2 mg via INTRAVENOUS
  Filled 2021-08-17: qty 1

## 2021-08-17 MED ORDER — METHYLPREDNISOLONE SODIUM SUCC 125 MG IJ SOLR
60.0000 mg | Freq: Two times a day (BID) | INTRAMUSCULAR | Status: DC
  Administered 2021-08-17 – 2021-08-18 (×2): 60 mg via INTRAVENOUS
  Filled 2021-08-17 (×2): qty 2

## 2021-08-17 MED ORDER — CHLORHEXIDINE GLUCONATE CLOTH 2 % EX PADS
6.0000 | MEDICATED_PAD | Freq: Every day | CUTANEOUS | Status: DC
Start: 2021-08-17 — End: 2021-08-21
  Administered 2021-08-18 – 2021-08-20 (×4): 6 via TOPICAL

## 2021-08-17 MED ORDER — BISACODYL 10 MG RE SUPP
10.0000 mg | Freq: Once | RECTAL | Status: AC
  Administered 2021-08-17: 10 mg via RECTAL
  Filled 2021-08-17: qty 1

## 2021-08-17 MED ORDER — MIDAZOLAM HCL 2 MG/2ML IJ SOLN
1.0000 mg | INTRAMUSCULAR | Status: DC | PRN
  Administered 2021-08-17 – 2021-08-18 (×4): 2 mg via INTRAVENOUS
  Filled 2021-08-17 (×4): qty 2

## 2021-08-17 MED ORDER — DEXMEDETOMIDINE HCL IN NACL 400 MCG/100ML IV SOLN
0.0000 ug/kg/h | INTRAVENOUS | Status: DC
  Administered 2021-08-17 (×2): 1.2 ug/kg/h via INTRAVENOUS
  Administered 2021-08-17: 0.4 ug/kg/h via INTRAVENOUS
  Administered 2021-08-17 – 2021-08-18 (×7): 1.2 ug/kg/h via INTRAVENOUS
  Filled 2021-08-17: qty 100
  Filled 2021-08-17: qty 200
  Filled 2021-08-17: qty 100
  Filled 2021-08-17 (×3): qty 200
  Filled 2021-08-17 (×2): qty 100

## 2021-08-17 MED ORDER — SORBITOL 70 % SOLN
960.0000 mL | TOPICAL_OIL | Freq: Once | ORAL | Status: DC
  Filled 2021-08-17: qty 473

## 2021-08-17 NOTE — Progress Notes (Signed)
eLink Physician-Brief Progress Note ?Patient Name: Zachary Hawkins ?DOB: 29-Jul-1955 ?MRN: [id number] ? ? ?Date of Service ? 08/17/2021  ?HPI/Events of Note ? Agitation - Kicking nursing staff in spite of Fentanyl and Precedex IV infusion + Haldol IV PRN. Brown nasal drainage.  ?eICU Interventions ? Plan: ?Bilateral wrist and ankle restaints X 12 hours. ?Versed 1-2 mg IV Q 2 hours PRN agitation or sedation. ?Saline nasal spray - 2 sprays to each nostril PRN. ?Flonase 2 sprays to each nostril now and daily.   ? ? ? ?Intervention Category ?Major Interventions: Arrhythmia - evaluation and management;Other: ? ?Zachary Hawkins ?08/17/2021, 10:36 PM ?

## 2021-08-17 NOTE — Progress Notes (Signed)
?  Transition of Care (TOC) Screening Note ? ? ?Patient Details  ?Name: Zachary Hawkins ?Date of Birth: [birth year] ? ? ?Transition of Care (TOC) CM/SW Contact:    ?Tom-Johnson, Hershal Coria, RN ?Phone Number: ?08/17/2021, 4:44 PM ? ?Patient is admitted for Acute Hypoxemic Respiratory Failure. Currently Intubated. On Precedex, Fentanyl. Has Bilateral wrist restraint. ?Transition of Care Department [facility]) has reviewed patient and no TOC needs or recommendations have been identified at this time. TOC will continue to monitor patient advancement through interdisciplinary progression rounds. If new patient transition needs arise, please place a TOC consult. ?  ?

## 2021-08-17 NOTE — Progress Notes (Signed)
Initial Nutrition Assessment ? ?DOCUMENTATION CODES:  ? ?Obesity unspecified ? ?INTERVENTION:  ? ?If pt unable to be extubated, recommend resuming tube feeds via OG tube: ?- Recommend starting Vital 1.5 @ 20 ml/hr and advancing by 10 ml q 8 hours to goal rate of 50 ml/hr (1200 ml/day) ?- ProSource TF 90 ml QID ? ?Recommended tube feeding regimen at goal rate would provide 2120 kcal, 169 grams of protein, and 917 ml of H2O. ? ?NUTRITION DIAGNOSIS:  ? ?Inadequate oral intake related to inability to eat as evidenced by NPO status. ? ?GOAL:  ? ?Provide needs based on ASPEN/SCCM guidelines ? ?MONITOR:  ? ?Vent status, Labs, Weight trends, I & O's ? ?REASON FOR ASSESSMENT:  ? ?Ventilator, Consult ?Enteral/tube feeding initiation and management ? ?ASSESSMENT:  ? ?66 year old male who presented to the ED on 4/07 with SOB. PMH of CHF, COPD, HTN, OSA, PAF, CKD stage III, HLD. Pt required intubation in the ED. Pt admitted with COPD exacerbation. ? ?Discussed pt with RN and during ICU rounds. ? ?Consult received for enteral nutrition initiation and management. Pt with OG tube tip and side port below the diaphragm per abdominal x-ray on 4/08. Noted tube feeds held overnight due to episode of emesis. Per RN, pt still having emesis today and tube feeds remain off. OG tube is clamped at this time per RN. Per discussion with CCM MD, will hold off on resuming tube feeds today as pt may be able to be extubated. ? ?Admit weight: 155.7 kg ?Current weight: 157.5 kg ? ?Pt currently with non-pitting edema to BUE and BLE. Suspect EDW is closer to 150 kg per review of weight history in chart. ? ?Patient is currently intubated on ventilator support ?MV: 9.2 L/min ?Temp (24hrs), Avg:98.5 ?F (36.9 ?C), Min:97.9 ?F (36.6 ?C), Max:99.6 ?F (37.6 ?C) ? ?Drips: ?Precedex ?Fentanyl ? ?Medications reviewed and include: colace, SSI q 4 hours, IV solu-medrol, protonix, miralax ? ?Labs reviewed: creatinine 1.61, phosphorus 6.5, magnesium 3.1 ?CBG's:  125-178 x 24 hours ? ?UOP: 1025 ml x 24 hours ?I/O's: +513 ml since admit ? ?NUTRITION - FOCUSED PHYSICAL EXAM: ? ?Flowsheet Row Most Recent Value  ?Orbital Region No depletion  ?Upper Arm Region No depletion  ?Thoracic and Lumbar Region No depletion  ?Buccal Region Unable to assess  ?Temple Region No depletion  ?Clavicle Bone Region No depletion  ?Clavicle and Acromion Bone Region No depletion  ?Scapular Bone Region No depletion  ?Dorsal Hand No depletion  ?Patellar Region No depletion  ?Anterior Thigh Region No depletion  ?Posterior Calf Region No depletion  ?Edema (RD Assessment) Moderate  [BUE, BLE]  ?Hair Reviewed  ?Eyes Reviewed  ?Mouth Reviewed  ?Skin Reviewed  ?Nails Reviewed  ? ?  ? ? ?Diet Order:   ?Diet Order   ? ?       ?  Diet NPO time specified  Diet effective now       ?  ? ?  ?  ? ?  ? ? ?EDUCATION NEEDS:  ? ?Not appropriate for education at this time ? ?Skin:  Skin Assessment: Reviewed RN Assessment ? ?Last BM:  08/15/21 ? ?Height:  ? ?Ht Readings from Last 1 Encounters:  ?08/15/21 6\' 1"  (1.854 m)  ? ? ?Weight:  ? ?Wt Readings from Last 1 Encounters:  ?08/17/21 (!) 157.5 kg  ? ? ?Ideal Body Weight:  83.6 kg ? ?BMI:  Body mass index is 45.81 kg/m?. ? ?Estimated Nutritional Needs:  ? ?Kcal:  1900-2100 ? ?Protein:  165-180 grams ? ?Fluid:  1.8 L/day ? ? ? ?Gustavus Bryant, MS, RD, LDN ?Inpatient Clinical Dietitian ?Please see AMiON for contact information. ? ?

## 2021-08-17 NOTE — Progress Notes (Signed)
? ?NAME:  ROYALTY REINHARDT, MRN:  [id number], DOB:  [birth year], LOS: 3 ?ADMISSION DATE:  08/14/2021, CONSULTATION DATE:  4/7 ?REFERRING MD:  Dr. Laverta Baltimore, CHIEF COMPLAINT:  COPD exacerbation  ? ?History of Present Illness:  ?Patient is a 66 year old male with pertinent PMH of HFrEF with cardiomyopathy, COPD on 4 LNC at home, OSA, HTN, PAF presents to [facility] ED on 4/7 with SOB/AMS. ? ?EMS called to patient's house on 4/7 due to patient having SOB.  Upon arrival EMS states that patient sats were 50% and patient was altered found on room air.  Patient was also found with a bottle of pain medicine which was mostly empty. Patient given DuoNebs, 125 Solu-Medrol, epi, mag, and started on CPAP.  Initially EMS states that the patient refused to come to hospital but after 30 minutes of discussion decided to come in for further evaluation.  EMS states that he is intermittently compliant with home oxygen and is supposed to wear CPAP but never does. Patient transported to [facility] ED. ? ?Patient arrived to [facility] ED on 4/7.  Patient denies chest pain/fever.  Patient's breathing is initially appeared more comfortable on CPAP and sats stable.  Afebrile and BP mildly hypertensive.  EKG with sinus tach rate 104.  CXR appears unchanged from previous CXRs, showing stable appearance of chest with percent stent cardiomegaly and bibasilar atelectasis.  BNP 19.5.  COVID flu negative.  Initial VBG 7.55, CO2 50, PO2 45, HCO3 44.  K 5.8, glucose 142, BUN 18, creatinine 1.61 (baseline 1.5).  Troponin 16.  WBC 9.8, Hgb 13.8.  Patient's mental status or altered.  Repeat ABG showing pH 7.2, CO2 greater 123, PO2 152, HCO3 48.  Patient intubated in ED.  PCCM consulted for ICU admission. ? ?Pertinent  Medical History  ? ?Past Medical History:  ?Diagnosis Date  ? Asthma   ? BPH (benign prostatic hyperplasia)   ? Cardiomyopathy ([place]) 06/2017  ? EF 40-45%  ? CHF (congestive heart failure) ([place])   ? Chronic renal insufficiency, stage 3 (moderate) (HCC)   ? COPD (chronic  obstructive pulmonary disease) ([place])   ? Diastolic dysfunction [id number]  ? grade 2   ? Hyperlipidemia   ? Hypertension   ? Hypertensive urgency   ? Obesity   ? Obesity   ? OSA (obstructive sleep apnea)   ? Paroxysmal atrial fibrillation ([place]) 2022  ? on Eliquis  ? ?Significant Hospital Events: ?Including procedures, antibiotic start and stop dates in addition to other pertinent events   ?4/7: admitted to [facility] for copd exacerbation resp failure; intubated ?4/8: Stable overnight ? ?Interim History / Subjective:  ?Patient remained severely agitated and restless once sedation is decreased ?He easily desats into 60s ? ?Objective   ?Blood pressure (!) 162/92, pulse 70, temperature 97.9 ?F (36.6 ?C), temperature source Axillary, resp. rate 17, height 6\' 1"  (1.854 m), weight (!) 157.5 kg, SpO2 96 %. ?   ?Vent Mode: PRVC ?FiO2 (%):  [40 %-50 %] 50 % ?Set Rate:  [15 bmp-20 bmp] 15 bmp ?Vt Set:  [560 mL] 560 mL ?PEEP:  [5 cmH20] 5 cmH20 ?Plateau Pressure:  [19 cmH20-27 cmH20] 19 cmH20  ? ?Intake/Output Summary (Last 24 hours) at 08/17/2021 1250 ?Last data filed at 08/17/2021 1200 ?Gross per 24 hour  ?Intake 1285.02 ml  ?Output 1325 ml  ?Net -39.98 ml  ? ?Filed Weights  ? 08/15/21 0342 08/16/21 0454 08/17/21 0500  ?Weight: (!) 155.7 kg (!) 157 kg (!) 157.5 kg  ? ? ?Examination: ?Physical exam: ?  General: Crtitically ill-appearing male, orally intubated ?HEENT: [place]/AT, eyes anicteric.  ETT and OGT in place ?Neuro: Sedated, not following commands.  Eyes are closed.  Pupils 3 mm bilateral reactive to light, remains listless and agitated ?Chest: Coarse breath sounds, no wheezes or rhonchi ?Heart: Regular rate and rhythm, no murmurs or gallops ?Abdomen: Soft, distended, bowel sounds present ?Skin: No rash ? ?Resolved Hospital Problem list   ? ? ?Assessment & Plan:  ? ?Acute on chronic hypoxemic/hypercapnic respiratory failure ?Acute COPD exacerbation ?Obstructive sleep apnea, noncompliant with CPAP ?Morbid obesity ?Continue lung  protective ventilation ?Continue DuoNeb, Pulmicort ?Continue IV steroid  ?VAP  ?Patient is not able to tolerate spontaneous breathing trial due to hypoxemia ? ?Acute metabolic encephalopathy with severe agitation ?Likely secondary to hypercapnia ?CT head unremarkable, some sinus congestion ?Alcohol and salicylate levels within normal limits ?Minimize sedation ?Propofol was switched to Precedex, continue fentanyl infusion ? ?Chronic diastolic heart failure ?Continue Home medications on hold including torsemide, spironolactone, Farxiga, amlodipine ?Bisoprolol continued ? ?Paroxysmal atrial fibrillation ?Heart rate remain well controlled, currently in sinus rhythm ?Continue bisoprolol ?On Eliquis for stroke prophylaxis ? ?Chronic kidney disease stage IIIa ?Hyperkalemia ?Hyperphosphatemia/hypermagnesemia ?Avoid nephrotoxic agents ?Monitor intake and output ?Monitor electrolytes ? ?BPH ?Continue Cardura ? ?Best Practice (right click and "Reselect all SmartList Selections" daily)  ? ?Diet/type: tubefeeds ?DVT prophylaxis: DOAC ?GI prophylaxis: PPI ?Lines: N/A ?Foley:  N/A ?Code Status:  full code ?Last date of multidisciplinary goals of care discussion [pending] ? ?Labs   ?CBC: ?Recent Labs  ?Lab 08/14/21 ?1648 08/14/21 ?1655 08/14/21 ?2055 08/15/21 ?[id number] 08/15/21 ?1036  ?WBC  --  9.8  --  9.1  --   ?NEUTROABS  --  5.2  --   --   --   ?HGB 15.3 13.8 15.6 13.2 14.6  ?HCT 45.0 47.9 46.0 43.4 43.0  ?MCV  --  99.4  --  95.4  --   ?PLT  --  218  --  226  --   ? ? ?Basic Metabolic Panel: ?Recent Labs  ?Lab 08/14/21 ?1655 08/14/21 ?2055 08/14/21 ?2241 08/15/21 ?[id number] 08/15/21 ?1036 08/16/21 ?1647 08/17/21 ?0820  ?NA 138 136 139 138 136  --   --   ?K 5.8* 4.9 4.7 4.7 4.6  --   --   ?CL 93*  --  90* 91*  --   --   --   ?CO2 39*  --  38* 37*  --   --   --   ?GLUCOSE 142*  --  183* 174*  --   --   --   ?BUN 18  --  18 21  --   --   --   ?CREATININE 1.61*  --  1.52* 1.61*  --   --   --   ?CALCIUM 8.6*  --  9.0 9.2  --   --   --   ?MG   --   --  2.7*  --   --  2.9* 3.1*  ?PHOS  --   --   --   --   --  5.7* 6.5*  ? ?GFR: ?Estimated Creatinine Clearance: 73.7 mL/min (A) (by C-G formula based on SCr of 1.61 mg/dL (H)). ?Recent Labs  ?Lab 08/14/21 ?1655 08/14/21 ?2241 08/15/21 ?[id number] 08/16/21 ?[id number]  ?PROCALCITON  --  <0.10 <0.10 <0.10  ?WBC 9.8  --  9.1  --   ? ? ?Liver Function Tests: ?Recent Labs  ?Lab 08/14/21 ?1655  ?AST 28  ?ALT 18  ?ALKPHOS 67  ?BILITOT  1.1  ?PROT 7.2  ?ALBUMIN 3.4*  ? ?No results for input(s): LIPASE, AMYLASE in the last 168 hours. ?Recent Labs  ?Lab 08/14/21 ?2241  ?AMMONIA 42*  ? ? ?ABG ?   ?Component Value Date/Time  ? PHART 7.434 08/15/2021 1036  ? PCO2ART 62.8 (H) 08/15/2021 1036  ? PO2ART 63 (L) 08/15/2021 1036  ? HCO3 42.4 (H) 08/15/2021 1036  ? TCO2 44 (H) 08/15/2021 1036  ? ACIDBASEDEF 2.0 03/12/2021 0557  ? O2SAT 92 08/15/2021 1036  ?  ? ?Coagulation Profile: ?No results for input(s): INR, PROTIME in the last 168 hours. ? ?Cardiac Enzymes: ?No results for input(s): CKTOTAL, CKMB, CKMBINDEX, TROPONINI in the last 168 hours. ? ?HbA1C: ?No results found for: HGBA1C ? ?CBG: ?Recent Labs  ?Lab 08/16/21 ?1924 08/16/21 ?2304 08/17/21 ?0310 08/17/21 ?0708 08/17/21 ?1147  ?GLUCAP 125* 156* 151* 138* 175*  ? ? ? ?Total critical care time: 44 minutes ? ?Performed by: Jacky Kindle ?  ?Critical care time was exclusive of separately billable procedures and treating other patients. ?  ?Critical care was necessary to treat or prevent imminent or life-threatening deterioration. ?  ?Critical care was time spent personally by me on the following activities: development of treatment plan with patient and/or surrogate as well as nursing, discussions with consultants, evaluation of patient's response to treatment, examination of patient, obtaining history from patient or surrogate, ordering and performing treatments and interventions, ordering and review of laboratory studies, ordering and review of radiographic studies, pulse oximetry and  re-evaluation of patient's condition. ?  ?Jacky Kindle MD ?[facility] ?See Amion for pager ?If no response to pager, please call [phone] until 7pm ?After 7pm, Please call E-link [phone]

## 2021-08-17 NOTE — Progress Notes (Signed)
Pt continuing to become extremely agitated with movement. Witnessed kicking other RN's present. Radiology tech Denyce Robert can also attest to increased agitation. Haldol did not change behavior.  ? ?Reported to E-link. ?

## 2021-08-17 NOTE — Progress Notes (Signed)
eLink Physician-Brief Progress Note ?Patient Name: Zachary Hawkins ?DOB: 1955-12-30 ?MRN: [id number] ? ? ?Date of Service ? 08/17/2021  ?HPI/Events of Note ? Multiple issues: 1. Agitation - Not able to increase Precedex IV infusion d/t HR = 55. QTc interval = 0.41 seconds. 2. Hypoxia - Sat = 91%. BMI = 45.81  ?eICU Interventions ? Plan: ?Increase PEEP to 8. ?Portable CXR STAT. ?Haldol 2 mg IV Q 3 hours PRN agitation.  ? ? ? ?Intervention Category ?Major Interventions: Hypoxemia - evaluation and management;Delirium, psychosis, severe agitation - evaluation and management ? ?Mallorie Norrod Dennard Nip ?08/17/2021, 8:04 PM ?

## 2021-08-18 ENCOUNTER — Ambulatory Visit: Payer: Medicaid Other | Admitting: Student

## 2021-08-18 DIAGNOSIS — J9601 Acute respiratory failure with hypoxia: Secondary | ICD-10-CM | POA: Diagnosis not present

## 2021-08-18 DIAGNOSIS — G4733 Obstructive sleep apnea (adult) (pediatric): Secondary | ICD-10-CM | POA: Diagnosis not present

## 2021-08-18 DIAGNOSIS — J441 Chronic obstructive pulmonary disease with (acute) exacerbation: Secondary | ICD-10-CM | POA: Diagnosis not present

## 2021-08-18 LAB — BASIC METABOLIC PANEL
Anion gap: 12 (ref 5–15)
Anion gap: 7 (ref 5–15)
BUN: 65 mg/dL — ABNORMAL HIGH (ref 8–23)
BUN: 61 mg/dL — ABNORMAL HIGH (ref 8–23)
CO2: 31 mmol/L (ref 22–32)
CO2: 36 mmol/L — ABNORMAL HIGH (ref 22–32)
Calcium: 8.9 mg/dL (ref 8.9–10.3)
Calcium: 9.1 mg/dL (ref 8.9–10.3)
Chloride: 97 mmol/L — ABNORMAL LOW (ref 98–111)
Chloride: 99 mmol/L (ref 98–111)
Creatinine, Ser: 1.76 mg/dL — ABNORMAL HIGH (ref 0.61–1.24)
Creatinine, Ser: 1.5 mg/dL — ABNORMAL HIGH (ref 0.61–1.24)
GFR, Estimated: 43 mL/min — ABNORMAL LOW (ref >60)
GFR, Estimated: 52 mL/min — ABNORMAL LOW (ref >60)
Glucose, Bld: 139 mg/dL — ABNORMAL HIGH (ref 70–99)
Glucose, Bld: 121 mg/dL — ABNORMAL HIGH (ref 70–99)
Potassium: 5.9 mmol/L — ABNORMAL HIGH (ref 3.5–5.1)
Potassium: 4.4 mmol/L (ref 3.5–5.1)
Sodium: 140 mmol/L (ref 135–145)
Sodium: 142 mmol/L (ref 135–145)

## 2021-08-18 LAB — PHOSPHORUS: Phosphorus: 7 mg/dL — ABNORMAL HIGH (ref 2.5–4.6)

## 2021-08-18 LAB — GLUCOSE, CAPILLARY
Glucose-Capillary: 102 mg/dL — ABNORMAL HIGH (ref 70–99)
Glucose-Capillary: 103 mg/dL — ABNORMAL HIGH (ref 70–99)
Glucose-Capillary: 134 mg/dL — ABNORMAL HIGH (ref 70–99)
Glucose-Capillary: 142 mg/dL — ABNORMAL HIGH (ref 70–99)
Glucose-Capillary: 143 mg/dL — ABNORMAL HIGH (ref 70–99)
Glucose-Capillary: 98 mg/dL (ref 70–99)

## 2021-08-18 LAB — CBC
HCT: 47.9 % (ref 39.0–52.0)
Hemoglobin: 14.2 g/dL (ref 13.0–17.0)
MCH: 28.4 pg (ref 26.0–34.0)
MCHC: 29.6 g/dL — ABNORMAL LOW (ref 30.0–36.0)
MCV: 95.8 fL (ref 80.0–100.0)
Platelets: 263 10*3/uL (ref 150–400)
RBC: 5 MIL/uL (ref 4.22–5.81)
RDW: 16.5 % — ABNORMAL HIGH (ref 11.5–15.5)
WBC: 9.7 10*3/uL (ref 4.0–10.5)
nRBC: 0 % (ref 0.0–0.2)

## 2021-08-18 LAB — MAGNESIUM: Magnesium: 3.4 mg/dL — ABNORMAL HIGH (ref 1.7–2.4)

## 2021-08-18 MED ORDER — DOXAZOSIN MESYLATE 2 MG PO TABS
2.0000 mg | ORAL_TABLET | Freq: Every day | ORAL | Status: DC
  Administered 2021-08-19 – 2021-08-21 (×3): 2 mg via ORAL
  Filled 2021-08-18 (×3): qty 1

## 2021-08-18 MED ORDER — ORAL CARE MOUTH RINSE
15.0000 mL | Freq: Two times a day (BID) | OROMUCOSAL | Status: DC
  Administered 2021-08-18 – 2021-08-21 (×6): 15 mL via OROMUCOSAL

## 2021-08-18 MED ORDER — LACTULOSE 10 GM/15ML PO SOLN
30.0000 g | ORAL | Status: DC
  Administered 2021-08-18: 30 g
  Filled 2021-08-18: qty 45

## 2021-08-18 MED ORDER — APIXABAN 5 MG PO TABS
5.0000 mg | ORAL_TABLET | Freq: Two times a day (BID) | ORAL | Status: DC
  Administered 2021-08-18 – 2021-08-21 (×6): 5 mg via ORAL
  Filled 2021-08-18 (×5): qty 1

## 2021-08-18 MED ORDER — POLYETHYLENE GLYCOL 3350 17 G PO PACK
17.0000 g | PACK | Freq: Two times a day (BID) | ORAL | Status: DC
Start: 2021-08-18 — End: 2021-08-18
  Administered 2021-08-18: 17 g
  Filled 2021-08-18: qty 1

## 2021-08-18 MED ORDER — SENNA 8.6 MG PO TABS
2.0000 | ORAL_TABLET | Freq: Two times a day (BID) | ORAL | Status: DC
  Administered 2021-08-18: 17.2 mg
  Filled 2021-08-18: qty 2

## 2021-08-18 MED ORDER — PANTOPRAZOLE SODIUM 40 MG PO TBEC
40.0000 mg | DELAYED_RELEASE_TABLET | Freq: Every day | ORAL | Status: DC
  Administered 2021-08-19 – 2021-08-21 (×3): 40 mg via ORAL
  Filled 2021-08-18 (×3): qty 1

## 2021-08-18 MED ORDER — SENNA 8.6 MG PO TABS
1.0000 | ORAL_TABLET | Freq: Every day | ORAL | Status: DC
  Administered 2021-08-20 – 2021-08-21 (×2): 8.6 mg via ORAL
  Filled 2021-08-18 (×2): qty 1

## 2021-08-18 MED ORDER — METHYLPREDNISOLONE SODIUM SUCC 40 MG IJ SOLR
40.0000 mg | Freq: Two times a day (BID) | INTRAMUSCULAR | Status: AC
  Administered 2021-08-18 – 2021-08-19 (×3): 40 mg via INTRAVENOUS
  Filled 2021-08-18 (×3): qty 1

## 2021-08-18 MED ORDER — LACTULOSE ENEMA: 300.0000 mL | Freq: Two times a day (BID) | ORAL | Status: DC

## 2021-08-18 MED ORDER — SENNA 8.6 MG PO TABS: 2.0000 | ORAL_TABLET | Freq: Two times a day (BID) | ORAL | Status: DC

## 2021-08-18 MED ORDER — BISOPROLOL FUMARATE 5 MG PO TABS
5.0000 mg | ORAL_TABLET | Freq: Every day | ORAL | Status: DC
  Administered 2021-08-19 – 2021-08-21 (×3): 5 mg via ORAL
  Filled 2021-08-18 (×3): qty 1

## 2021-08-18 MED ORDER — LACTULOSE 10 GM/15ML PO SOLN: 30.0000 g | ORAL | Status: DC

## 2021-08-18 MED ORDER — ATORVASTATIN CALCIUM 10 MG PO TABS
10.0000 mg | ORAL_TABLET | Freq: Every day | ORAL | Status: DC
  Administered 2021-08-19 – 2021-08-21 (×3): 10 mg via ORAL
  Filled 2021-08-18 (×3): qty 1

## 2021-08-18 MED ORDER — SODIUM ZIRCONIUM CYCLOSILICATE 10 G PO PACK
10.0000 g | PACK | Freq: Once | ORAL | Status: AC
Start: 2021-08-18 — End: 2021-08-18
  Administered 2021-08-18: 10 g
  Filled 2021-08-18: qty 1

## 2021-08-18 MED ORDER — SORBITOL 70 % SOLN
960.0000 mL | TOPICAL_OIL | Freq: Once | ORAL | Status: DC
  Filled 2021-08-18: qty 473

## 2021-08-18 NOTE — Procedures (Signed)
Extubation Procedure Note ? ?Patient Details:   ?Name: Zachary Hawkins ?DOB: 1956/02/12 ?MRN: [id number] ?  ?Airway Documentation:  ?  ?Vent end date: 08/18/21 Vent end time: 0944  ? ?Evaluation ? O2 sats: stable throughout ?Complications: No apparent complications ?Patient did tolerate procedure well. ?Bilateral Breath Sounds: Clear, Diminished ?  ?No ? ?Per MD order, pt was extubated to BiPAP through servo. Prior to extubation pt did have positive cuff leak and pt was able to follow commands with RT, MD and RN at bedside. After extubation pt was not able to state his name or respond. MD was called back to bedside and continue with BiPAP. No stridor noted and pt's VSS. RT will continue to monitor pt. ? ?Megan Mans ?08/18/2021, 9:47 AM ? ?

## 2021-08-18 NOTE — Progress Notes (Signed)
RT removed pt from BiPAP per MD and placed pt on 4L [place]. Pt tolerating well with SVS. RT will continue to monitor pt. ?

## 2021-08-18 NOTE — Progress Notes (Signed)
eLink Physician-Brief Progress Note ?Patient Name: Zachary Hawkins ?DOB: 11-05-1955 ?MRN: [id number] ? ? ?Date of Service ? 08/18/2021  ?HPI/Events of Note ? Hyperkaemia - K+ = 5.9.  ?eICU Interventions ? Plan: ?Lokelma 10 gm per tube X 1 now, ?Repeat BMP at 12 noon.  ? ? ? ?Intervention Category ?Major Interventions: Hemorrhage - evaluation and management;Other: ? ?[name] ?08/18/2021, 4:52 AM ?

## 2021-08-18 NOTE — Evaluation (Signed)
Physical Therapy Evaluation ?Patient Details ?Name: Zachary Hawkins ?MRN: [id number] ?DOB: 08-28-1955 ?Today's Date: 08/18/2021 ? ?History of Present Illness ? Pt adm 4/7 with copd exacerbation and respiratory failure. Intubated 4/7 and extubated 4/11.  PMH - COPD with chronic hypoxemic respiratory failure, diastolic heart failure, htn, paroxysmal fibrillation, ckd, morbid obesity  ?Clinical Impression ? Pt presents to PT with significant decline in mobility and very high fall risk due to weakness, decr balance, decr awareness of safety, and impulsivity. Pt will have to make significant gains physically and cognitively to return home alone. Currently recommend SNF unless pt has some more support at home.    ?   ? ?Recommendations for follow up therapy are one component of a multi-disciplinary discharge planning process, led by the attending physician.  Recommendations may be updated based on patient status, additional functional criteria and insurance authorization. ? ?Follow Up Recommendations Skilled nursing-short term rehab (<3 hours/day) (unless pt progresses rapidly and has some home support) ? ?  ?Assistance Recommended at Discharge Frequent or constant Supervision/Assistance  ?Patient can return home with the following ? A lot of help with walking and/or transfers;Help with stairs or ramp for entrance;A lot of help with bathing/dressing/bathroom;Assistance with cooking/housework ? ?  ?Equipment Recommendations None recommended by PT  ?Recommendations for Other Services ?    ?  ?Functional Status Assessment Patient has had a recent decline in their functional status and demonstrates the ability to make significant improvements in function in a reasonable and predictable amount of time.  ? ?  ?Precautions / Restrictions Precautions ?Precautions: Fall;Other (comment) ?Precaution Comments: Impulsive  ? ?  ? ?Mobility ? Bed Mobility ?Overal bed mobility: Needs Assistance ?Bed Mobility: Supine to Sit, Sit to Supine ?   ?  ?Supine to sit: Min assist ?Sit to supine: Min guard ?  ?General bed mobility comments: Assist to elevate trunk into sitting and for safety ?  ? ?Transfers ?Overall transfer level: Needs assistance ?Equipment used: Rolling walker (2 wheels) ?Transfers: Sit to/from Stand, Bed to chair/wheelchair/BSC ?Sit to Stand: Min assist, Mod assist, +2 physical assistance ?  ?Step pivot transfers: +2 physical assistance, Min assist ?  ?  ?  ?General transfer comment: Assist for balance and to bring hips up. From bed able to stand with min assist and step pivot to chair with min assist and verbal/tactile cues for safety. Pt incontinent of stool. Pt stood from chair with +1 mod assist with walker to be cleaned. Needed to stand again to finish pericare and pt with difficulty coming to feet and required +2 mod assist to bring hips up and for balance to stand and prevent fall. Pivotal steps back to bed with walker. ?  ? ?Ambulation/Gait ?  ?  ?  ?  ?  ?  ?  ?General Gait Details: Did not attempt secondary to fatigue, impulsivity and respiratory status ? ?Stairs ?  ?  ?  ?  ?  ? ?Wheelchair Mobility ?  ? ?Modified Rankin (Stroke Patients Only) ?  ? ?  ? ?Balance Overall balance assessment: Needs assistance ?Sitting-balance support: No upper extremity supported, Feet supported ?Sitting balance-Leahy Scale: Good ?  ?  ?Standing balance support: Bilateral upper extremity supported ?Standing balance-Leahy Scale: Poor ?Standing balance comment: walker and min to min guard for static standing ?  ?  ?  ?  ?  ?  ?  ?  ?  ?  ?  ?   ? ? ? ?Pertinent Vitals/Pain Pain Assessment ?Pain Assessment:  No/denies pain  ? ? ?Home Living Family/patient expects to be discharged to:: Private residence ?Living Arrangements: Alone ?Available Help at Discharge: Family;Friend(s);Available PRN/intermittently ?Type of Home: House ?Home Access: Stairs to enter ?Entrance Stairs-Rails: None ?Entrance Stairs-Number of Steps: 3+1 ?  ?Home Layout: One level ?Home  Equipment: Grab bars - tub/shower;BSC/3in1;Rolling Walker (2 wheels) ?Additional Comments: Pt on 4-6 L at home; Pulse oximeter,  ?  ?Prior Function Prior Level of Function : Independent/Modified Independent;Driving ?  ?  ?  ?  ?  ?  ?Mobility Comments: Pt typically ambulates without assistive device. ?ADLs Comments: Independent with ADLs, states that he had aides coming to assist with heavy IADLs/housework. they did not assist with ADLs (per prior encounter) ?  ? ? ?Hand Dominance  ? Dominant Hand: Right ? ?  ?Extremity/Trunk Assessment  ? Upper Extremity Assessment ?Upper Extremity Assessment: Defer to OT evaluation ?  ? ?Lower Extremity Assessment ?Lower Extremity Assessment: Generalized weakness ?  ? ?   ?Communication  ? Communication: No difficulties  ?Cognition Arousal/Alertness: Awake/alert ?Behavior During Therapy: Impulsive ?Overall Cognitive Status: Impaired/Different from baseline ?Area of Impairment: Attention, Memory, Following commands, Safety/judgement, Awareness, Problem solving ?  ?  ?  ?  ?  ?  ?  ?  ?  ?Current Attention Level: Sustained ?Memory: Decreased short-term memory, Decreased recall of precautions ?Following Commands: Follows one step commands consistently ?Safety/Judgement: Decreased awareness of safety, Decreased awareness of deficits ?Awareness: Intellectual ?Problem Solving: Requires verbal cues, Requires tactile cues ?  ?  ?  ? ?  ?General Comments General comments (skin integrity, edema, etc.): Pt on 4L O2. At rest SpO2 mid 90's. With activity SpO2 to mid 80's and pt needed verbal cues to stop talking. Returned to >90 with rest. ? ?  ?Exercises    ? ?Assessment/Plan  ?  ?PT Assessment Patient needs continued PT services  ?PT Problem List Decreased strength;Decreased activity tolerance;Decreased balance;Decreased mobility;Decreased cognition;Decreased safety awareness;Obesity ? ?   ?  ?PT Treatment Interventions DME instruction;Gait training;Functional mobility training;Therapeutic  activities;Therapeutic exercise;Balance training;Patient/family education   ? ?PT Goals (Current goals can be found in the Care Plan section)  ?Acute Rehab PT Goals ?Patient Stated Goal: go home ?PT Goal Formulation: With patient ?Time For Goal Achievement: 09/01/21 ?Potential to Achieve Goals: Good ? ?  ?Frequency Min 3X/week ?  ? ? ?Co-evaluation   ?  ?  ?  ?  ? ? ?  ?AM-PAC PT "6 Clicks" Mobility  ?Outcome Measure Help needed turning from your back to your side while in a flat bed without using bedrails?: None ?Help needed moving from lying on your back to sitting on the side of a flat bed without using bedrails?: A Little ?Help needed moving to and from a bed to a chair (including a wheelchair)?: A Lot ?Help needed standing up from a chair using your arms (e.g., wheelchair or bedside chair)?: Total ?Help needed to walk in hospital room?: Total ?Help needed climbing 3-5 steps with a railing? : Total ?6 Click Score: 12 ? ?  ?End of Session Equipment Utilized During Treatment: Oxygen ?Activity Tolerance: Patient limited by fatigue ?Patient left: in bed;with call bell/phone within reach;with bed alarm set ?Nurse Communication: Mobility status (Nurse assisted with last transfer) ?PT Visit Diagnosis: Unsteadiness on feet (R26.81);Other abnormalities of gait and mobility (R26.89);Muscle weakness (generalized) (M62.81) ?  ? ?Time: [id number] ?PT Time Calculation (min) (ACUTE ONLY): 40 min ? ? ?Charges:   PT Evaluation ?$PT Eval Moderate Complexity: 1 Mod ?PT Treatments ?$  Therapeutic Activity: 23-37 mins ?  ?   ? ? ?[facility] PT ?Acute Rehabilitation Services ?Office [phone] ? ? ?Shary Decamp [facility] ?08/18/2021, 4:11 PM ? ?

## 2021-08-18 NOTE — Progress Notes (Signed)
? ? ?NAME:  Zachary Hawkins, MRN:  [id number], DOB:  04-13-1956, LOS: 4 ?ADMISSION DATE:  08/14/2021, CONSULTATION DATE:  4/7 ?REFERRING MD:  Dr. Laverta Baltimore, CHIEF COMPLAINT:  COPD exacerbation  ? ?History of Present Illness:  ?Patient is a 66 year old male with pertinent PMH of HFrEF with cardiomyopathy, COPD on 4 LNC at home, OSA, HTN, PAF presents to [facility] ED on 4/7 with SOB/AMS. ? ?EMS called to patient's house on 4/7 due to patient having SOB.  Upon arrival EMS states that patient sats were 50% and patient was altered found on room air.  Patient was also found with a bottle of pain medicine which was mostly empty. Patient given DuoNebs, 125 Solu-Medrol, epi, mag, and started on CPAP.  Initially EMS states that the patient refused to come to hospital but after 30 minutes of discussion decided to come in for further evaluation.  EMS states that he is intermittently compliant with home oxygen and is supposed to wear CPAP but never does. Patient transported to [facility] ED. ? ?Patient arrived to [facility] ED on 4/7.  Patient denies chest pain/fever.  Patient's breathing is initially appeared more comfortable on CPAP and sats stable.  Afebrile and BP mildly hypertensive.  EKG with sinus tach rate 104.  CXR appears unchanged from previous CXRs, showing stable appearance of chest with percent stent cardiomegaly and bibasilar atelectasis.  BNP 19.5.  COVID flu negative.  Initial VBG 7.55, CO2 50, PO2 45, HCO3 44.  K 5.8, glucose 142, BUN 18, creatinine 1.61 (baseline 1.5).  Troponin 16.  WBC 9.8, Hgb 13.8.  Patient's mental status or altered.  Repeat ABG showing pH 7.2, CO2 greater 123, PO2 152, HCO3 48.  Patient intubated in ED.  PCCM consulted for ICU admission. ? ?Pertinent  Medical History  ? ?Past Medical History:  ?Diagnosis Date  ? Asthma   ? BPH (benign prostatic hyperplasia)   ? Cardiomyopathy ([place]) 06/2017  ? EF 40-45%  ? CHF (congestive heart failure) ([place])   ? Chronic renal insufficiency, stage 3 (moderate) (HCC)   ? COPD  (chronic obstructive pulmonary disease) ([place])   ? Diastolic dysfunction [id number]  ? grade 2   ? Hyperlipidemia   ? Hypertension   ? Hypertensive urgency   ? Obesity   ? Obesity   ? OSA (obstructive sleep apnea)   ? Paroxysmal atrial fibrillation ([place]) 2022  ? on Eliquis  ? ?Significant Hospital Events: ?Including procedures, antibiotic start and stop dates in addition to other pertinent events   ?4/7: admitted to [facility] for copd exacerbation resp failure; intubated ?4/8: Stable overnight ? ?Interim History / Subjective:  ?Agitated overnight requiring Haldol, hyperkalemic-given Lokelma 10 g X1 ? ?Tmax 98.9, documented blood pressure of 182/103 ? ?PRVC 60/15/560/8 ? ?Blood glucose 128-175 ? ?2.6 L urine output, 400 NG output, net -1.3 L admission, -852 past 24 hours ? ?Unable to obtain subjective evaluation due to patient status ? ?Objective   ?Blood pressure 130/80, pulse 69, temperature 97.7 ?F (36.5 ?C), temperature source Oral, resp. rate 14, height 6\' 1"  (1.854 m), weight (!) 155.5 kg, SpO2 94 %. ?   ?Vent Mode: PSV;CPAP ?FiO2 (%):  [40 %-70 %] 40 % ?Set Rate:  [15 bmp] 15 bmp ?Vt Set:  [560 mL] 560 mL ?PEEP:  [5 cmH20-8 cmH20] 5 cmH20 ?Plateau Pressure:  [19 cmH20-30 cmH20] 29 cmH20  ? ?Intake/Output Summary (Last 24 hours) at 08/18/2021 0854 ?Last data filed at 08/18/2021 [id number] ?Gross per 24 hour  ?Intake 1585.37 ml  ?Output  3075 ml  ?Net -1489.63 ml  ? ?Filed Weights  ? 08/16/21 0454 08/17/21 0500 08/18/21 0500  ?Weight: (!) 157 kg (!) 157.5 kg (!) 155.5 kg  ? ? ?Examination: ?General: In bed, NAD, appears comfortable ?HEENT: MM pink/moist, anicteric, atraumatic ?Neuro: sedated, PEERL 31mm ?CV: S1S2, NSR, no m/r/g appreciated ?PULM:  air movement appreciated in all lower lobes, trachea midline, chest expansion symmetric ?GI: distended, bsx4 active, rounded   ?Extremities: warm/dry, no pretibial edema, capillary refill less than 3 seconds  ?Skin:  no rashes or lesions noted ? ?Labs and imaging: ?CBC reviewed ?K5.9 ?BUN  21>65 ?Creatinine 1.61>1.52>1.61>1.76 ?MAG 3.1> 3.4 ?Phos 6.5 > 7.0 ? ?CXR: Stable ETT, no pneumothorax, no effusion ?KUB: study limited due to body habitus, non-specific gas pattern ? ?Resolved Hospital Problem list   ? ? ?Assessment & Plan:  ? ?Acute on chronic hypoxemic/hypercapnic respiratory failure ?Acute COPD exacerbation, on 4LNC at home  ?Obstructive sleep apnea, noncompliant with CPAP ?Morbid obesity ?-LTVV strategy with tidal volumes of 4-8 cc/kg ideal body weight ?-Goal plateau pressures less than 30 and driving pressures less than 15 ?-Wean PEEP/FiO2 for SpO2 88-96% ?-VAP bundle ?-Daily SAT and SBT. Currently on SBT. Hope to extubate today ?-PAD bundle with Precedex gtt and fentanyl gtt ?-RASS goal 0 to -1 ?-Continue solumedrol, day 4/5 ?-Stopped diamox ? ?Acute metabolic encephalopathy with severe agitation ?Likely secondary to hypercapnia VS ICU delirium. 4/7CT head unremarkable, some sinus congestion. Alcohol and salicylate levels within normal limits. Sever3 agitation 4/10-11 with nurses hit overnight.  ?-Minimize sedation as able ?-If unable to extubate will add per tube zyprexa ? ?Chronic diastolic heart failure ?-Hold home torsemide, spironolactone, Farxiga, amlodipine ?-Continue bisoprolol ? ?Paroxysmal atrial fibrillation ?Heart rate remain well controlled, currently in NSR ?-continue bisoprolol ?-Continue eliquis ? ? ?Chronic kidney disease stage IIIa ?Hyperkalemia ?Hyperphosphatemia/hypermagnesemia ?Creatinine 1.61>1.52>1.61>1.76 ?-Ensure renal perfusion. Goal MAP 65 or greater. ?-Avoid neprotoxic drugs as possible. ?-Strict I&O's ?-Follow up AM creatinine ? ?Hyperkalemia ?K 5.9 overnight. Lokelma given x1 ?-Follow up repeat BMP ?-Temporize/shift/remove as needed ? ?Constipation ?S/P SMOG emema. KUB: study limited due to body habitus, non-specific gas pattern ?-Adding q4h lactulose 30mg  per tube ? ?HX HTN ?-PRN hydralazine ? ?BPH ?Continue cardura ? ?Best Practice (right click and "Reselect  all SmartList Selections" daily)  ? ?Diet/type: tubefeeds ?DVT prophylaxis: DOAC ?GI prophylaxis: PPI ?Lines: N/A ?Foley:  N/A ?Code Status:  full code ?Last date of multidisciplinary goals of care discussion [pending] ? ?Critical care time 40 minutes ? ?Redmond School., MSN, APRN, AGACNP-BC ?[facility]  ?08/18/2021 , 8:54 AM ? ?Please see [email or web address] for pager details ? ?If no response, please call [phone] ?After hours, please call Elink at [phone] ? ? ? ? ? ?

## 2021-08-19 ENCOUNTER — Inpatient Hospital Stay (HOSPITAL_COMMUNITY): Payer: Medicaid Other

## 2021-08-19 DIAGNOSIS — J9601 Acute respiratory failure with hypoxia: Secondary | ICD-10-CM | POA: Diagnosis not present

## 2021-08-19 DIAGNOSIS — J441 Chronic obstructive pulmonary disease with (acute) exacerbation: Secondary | ICD-10-CM | POA: Diagnosis not present

## 2021-08-19 LAB — BASIC METABOLIC PANEL
Anion gap: 5 (ref 5–15)
BUN: 50 mg/dL — ABNORMAL HIGH (ref 8–23)
CO2: 36 mmol/L — ABNORMAL HIGH (ref 22–32)
Calcium: 9 mg/dL (ref 8.9–10.3)
Chloride: 98 mmol/L (ref 98–111)
Creatinine, Ser: 1.45 mg/dL — ABNORMAL HIGH (ref 0.61–1.24)
GFR, Estimated: 54 mL/min — ABNORMAL LOW (ref >60)
Glucose, Bld: 124 mg/dL — ABNORMAL HIGH (ref 70–99)
Potassium: 4.5 mmol/L (ref 3.5–5.1)
Sodium: 139 mmol/L (ref 135–145)

## 2021-08-19 LAB — CBC
HCT: 46.1 % (ref 39.0–52.0)
Hemoglobin: 13.9 g/dL (ref 13.0–17.0)
MCH: 29.1 pg (ref 26.0–34.0)
MCHC: 30.2 g/dL (ref 30.0–36.0)
MCV: 96.4 fL (ref 80.0–100.0)
Platelets: 229 10*3/uL (ref 150–400)
RBC: 4.78 MIL/uL (ref 4.22–5.81)
RDW: 16.2 % — ABNORMAL HIGH (ref 11.5–15.5)
WBC: 9.1 10*3/uL (ref 4.0–10.5)
nRBC: 0 % (ref 0.0–0.2)

## 2021-08-19 LAB — GLUCOSE, CAPILLARY
Glucose-Capillary: 130 mg/dL — ABNORMAL HIGH (ref 70–99)
Glucose-Capillary: 136 mg/dL — ABNORMAL HIGH (ref 70–99)
Glucose-Capillary: 147 mg/dL — ABNORMAL HIGH (ref 70–99)
Glucose-Capillary: 112 mg/dL — ABNORMAL HIGH (ref 70–99)

## 2021-08-19 LAB — PHOSPHORUS: Phosphorus: 4.2 mg/dL (ref 2.5–4.6)

## 2021-08-19 LAB — MAGNESIUM: Magnesium: 2.9 mg/dL — ABNORMAL HIGH (ref 1.7–2.4)

## 2021-08-19 MED ORDER — TORSEMIDE 20 MG PO TABS
80.0000 mg | ORAL_TABLET | Freq: Every day | ORAL | Status: DC
  Administered 2021-08-19 – 2021-08-21 (×3): 80 mg via ORAL
  Filled 2021-08-19 (×3): qty 4

## 2021-08-19 MED ORDER — INSULIN ASPART 100 UNIT/ML IJ SOLN
0.0000 [IU] | Freq: Three times a day (TID) | INTRAMUSCULAR | Status: DC
  Administered 2021-08-19 – 2021-08-20 (×2): 1 [IU] via SUBCUTANEOUS

## 2021-08-19 MED ORDER — PREDNISONE 20 MG PO TABS: 20.0000 mg | ORAL_TABLET | Freq: Every day | ORAL | Status: DC

## 2021-08-19 MED ORDER — AMLODIPINE BESYLATE 5 MG PO TABS
5.0000 mg | ORAL_TABLET | Freq: Every day | ORAL | Status: DC
Start: 2021-08-19 — End: 2021-08-21
  Administered 2021-08-19 – 2021-08-21 (×3): 5 mg via ORAL
  Filled 2021-08-19 (×3): qty 1

## 2021-08-19 MED ORDER — PREDNISONE 20 MG PO TABS
40.0000 mg | ORAL_TABLET | Freq: Every day | ORAL | Status: AC
  Administered 2021-08-20: 40 mg via ORAL
  Filled 2021-08-19: qty 2

## 2021-08-19 MED ORDER — ADULT MULTIVITAMIN W/MINERALS CH
1.0000 | ORAL_TABLET | Freq: Every day | ORAL | Status: DC
  Administered 2021-08-19 – 2021-08-21 (×3): 1 via ORAL
  Filled 2021-08-19 (×3): qty 1

## 2021-08-19 MED ORDER — DAPAGLIFLOZIN PROPANEDIOL 10 MG PO TABS
10.0000 mg | ORAL_TABLET | Freq: Every day | ORAL | Status: DC
  Administered 2021-08-19 – 2021-08-21 (×3): 10 mg via ORAL
  Filled 2021-08-19 (×3): qty 1

## 2021-08-19 MED ORDER — PREDNISONE 20 MG PO TABS: 30.0000 mg | ORAL_TABLET | Freq: Every day | ORAL | Status: DC

## 2021-08-19 MED ORDER — PREDNISONE 10 MG PO TABS: 10.0000 mg | ORAL_TABLET | Freq: Every day | ORAL | Status: DC

## 2021-08-19 MED ORDER — INSULIN ASPART 100 UNIT/ML IJ SOLN: 0.0000 [IU] | Freq: Every day | INTRAMUSCULAR | Status: DC

## 2021-08-19 NOTE — Progress Notes (Signed)
Pt's daughter Sallyanne Kuster at hospital to collect pts belongings. Yellow form given to daughter. Pt's belonging takene home except for cellphone which is with patient.  ? ?

## 2021-08-19 NOTE — Progress Notes (Signed)
Pt daughter arrived to radiology nurses station to retrieve father's belongings. She states she was advised by security on the ground floor that IR would have his belongings. IR does not have this pt belongings. Per note in chart from 08/15/2021 0919am pt belongings were checked in with security. Pt daughter does have the belongings slip. Pt daughter escorted to ED security to retreive belongings.  ?

## 2021-08-19 NOTE — Progress Notes (Signed)
Pt's daughter made aware of pt falling. Pt's daughter discloses that pt has been having falls at home. A home aide was hired to assist with pt at home.  ?

## 2021-08-19 NOTE — Progress Notes (Signed)
Physical Therapy Treatment ?Patient Details ?Name: Zachary Hawkins ?MRN: [id number] ?DOB: 1955/10/05 ?Today's Date: 08/19/2021 ? ? ?History of Present Illness Pt adm 4/7 with copd exacerbation and respiratory failure. Intubated 4/7 and extubated 4/11.  PMH - COPD with chronic hypoxemic respiratory failure, diastolic heart failure, htn, paroxysmal fibrillation, ckd, morbid obesity ? ?  ?PT Comments  ? ? Pt admitted with above diagnosis. Pt was able to pivot to recliner from bed still needing +2 min to mod assist due to poor safety and due to poor endurance. Pt performed some exercises as well.  Expect slow progress and feel that pt will need SNF.  Will continue to follow acutely.  Pt currently with functional limitations due to balance and endurance deficits.  Pt will benefit from skilled PT to increase their independence and safety with mobility to allow discharge to the venue listed below.      ?Recommendations for follow up therapy are one component of a multi-disciplinary discharge planning process, led by the attending physician.  Recommendations may be updated based on patient status, additional functional criteria and insurance authorization. ? ?Follow Up Recommendations ? Skilled nursing-short term rehab (<3 hours/day) ?  ?  ?Assistance Recommended at Discharge Frequent or constant Supervision/Assistance  ?Patient can return home with the following A lot of help with walking and/or transfers;Help with stairs or ramp for entrance;A lot of help with bathing/dressing/bathroom;Assistance with cooking/housework ?  ?Equipment Recommendations ? None recommended by PT  ?  ?Recommendations for Other Services   ? ? ?  ?Precautions / Restrictions Precautions ?Precautions: Fall;Other (comment) ?Precaution Comments: Impulsive ?Restrictions ?Weight Bearing Restrictions: No  ?  ? ?Mobility ? Bed Mobility ?Overal bed mobility: Needs Assistance ?Bed Mobility: Supine to Sit ?  ?  ?Supine to sit: Min guard ?  ?  ?General bed  mobility comments: for safety, HOB elevated ?  ? ?Transfers ?Overall transfer level: Needs assistance ?Equipment used: Rolling walker (2 wheels) ?Transfers: Sit to/from Stand, Bed to chair/wheelchair/BSC ?Sit to Stand: Min assist, Mod assist, +2 physical assistance ?  ?Step pivot transfers: +2 physical assistance, Min assist ?  ?  ?  ?General transfer comment: Assist for balance and to bring hips up. From bed able to stand with min assist and step pivot to chair with min assist and verbal/tactile cues for safety. Pt moves impulsively therefore needs incr guard assist. Needed cues for stepping around and getting LEs against chair and assist moving RW. Pt stood from chair with +1 mod assist with walker and marched in place. ?  ? ?Ambulation/Gait ?  ?  ?  ?  ?  ?  ?  ?General Gait Details: Did not attempt secondary to fatigue, impulsivity and respiratory status ? ? ?Stairs ?  ?  ?  ?  ?  ? ? ?Wheelchair Mobility ?  ? ?Modified Rankin (Stroke Patients Only) ?  ? ? ?  ?Balance Overall balance assessment: Needs assistance ?Sitting-balance support: No upper extremity supported, Feet supported ?Sitting balance-Leahy Scale: Good ?  ?  ?Standing balance support: Bilateral upper extremity supported, During functional activity ?Standing balance-Leahy Scale: Poor ?Standing balance comment: relies on BUE and external support ?  ?  ?  ?  ?  ?  ?  ?  ?  ?  ?  ?  ? ?  ?Cognition Arousal/Alertness: Awake/alert ?Behavior During Therapy: Impulsive ?Overall Cognitive Status: Impaired/Different from baseline ?Area of Impairment: Attention, Memory, Following commands, Safety/judgement, Awareness, Problem solving ?  ?  ?  ?  ?  ?  ?  ?  ?  ?  Current Attention Level: Sustained ?Memory: Decreased short-term memory, Decreased recall of precautions ?Following Commands: Follows multi-step commands with increased time ?Safety/Judgement: Decreased awareness of safety, Decreased awareness of deficits ?Awareness: Emergent ?Problem Solving: Slow  processing, Requires verbal cues, Difficulty sequencing ?  ?  ?  ? ?  ?Exercises General Exercises - Lower Extremity ?Ankle Circles/Pumps: AROM, Both, 10 reps, Supine ?Long Arc Quad: AROM, Both, 10 reps, Seated ?Hip Flexion/Marching: AROM, Both, 10 reps, Standing ? ?  ?General Comments General comments (skin integrity, edema, etc.): Pt on 6LNC, cuing for PLB throughout session; Sats ranged from 88-90% on RA; with activity desaturates to low 80's. ?  ?  ? ?Pertinent Vitals/Pain Pain Assessment ?Pain Assessment: No/denies pain  ? ? ?Home Living Family/patient expects to be discharged to:: Private residence ?Living Arrangements: Alone ?Available Help at Discharge: Family;Friend(s);Available PRN/intermittently ?Type of Home: House ?Home Access: Stairs to enter ?Entrance Stairs-Rails: None ?Entrance Stairs-Number of Steps: 3+1 ?  ?Home Layout: One level ?Home Equipment: Grab bars - tub/shower;BSC/3in1;Rolling Walker (2 wheels) ?Additional Comments: Pt on 4-6 L at home; Pulse oximeter.  ?  ?Prior Function    ?  ?  ?   ? ?PT Goals (current goals can now be found in the care plan section) Acute Rehab PT Goals ?Patient Stated Goal: go home ?Progress towards PT goals: Progressing toward goals ? ?  ?Frequency ? ? ? Min 3X/week ? ? ? ?  ?PT Plan Current plan remains appropriate  ? ? ?Co-evaluation PT/OT/SLP Co-Evaluation/Treatment: Yes ?Reason for Co-Treatment: Complexity of the patient's impairments (multi-system involvement);For patient/therapist safety ?PT goals addressed during session: Mobility/safety with mobility ?  ?  ? ?  ?AM-PAC PT "6 Clicks" Mobility   ?Outcome Measure ? Help needed turning from your back to your side while in a flat bed without using bedrails?: None ?Help needed moving from lying on your back to sitting on the side of a flat bed without using bedrails?: A Little ?Help needed moving to and from a bed to a chair (including a wheelchair)?: Total ?Help needed standing up from a chair using your arms  (e.g., wheelchair or bedside chair)?: Total ?Help needed to walk in hospital room?: Total ?Help needed climbing 3-5 steps with a railing? : Total ?6 Click Score: 11 ? ?  ?End of Session Equipment Utilized During Treatment: Oxygen;Gait belt ?Activity Tolerance: Patient limited by fatigue ?Patient left: with call bell/phone within reach;in chair;with chair alarm set ?Nurse Communication: Mobility status ?PT Visit Diagnosis: Unsteadiness on feet (R26.81);Other abnormalities of gait and mobility (R26.89);Muscle weakness (generalized) (M62.81) ?  ? ? ?Time: [id number] ?PT Time Calculation (min) (ACUTE ONLY): 33 min ? ?Charges:  $Therapeutic Activity: 8-22 mins          ?          ? ?Berkeley Vanaken M,PT ?Acute Rehab Services ?[phone] ?[phone] (pager)  ? ? ?Chaka Jefferys F Andres Vest ?08/19/2021, 12:58 PM ? ?

## 2021-08-19 NOTE — Evaluation (Signed)
Occupational Therapy Evaluation Patient Details Name: Zachary Hawkins MRN: [id number] DOB: 1956-02-14 Today's Date: 08/19/2021   History of Present Illness Pt adm 4/7 with copd exacerbation and respiratory failure. Intubated 4/7 and extubated 4/11.  PMH - COPD with chronic hypoxemic respiratory failure, diastolic heart failure, htn, paroxysmal fibrillation, ckd, morbid obesity   Clinical Impression   PTA patinet reports independent with mobility, ADLs and driving; reports some assist from friends/family for IADLs and service called "[employer]"? For med mgmt.  He was admitted for above and presents with problem list below, including impaired cognition, decreased activity tolerance, generalized weakness and impaired balance. Pt moves impulsively and requires cueing for safety, demonstrates decreased recall, awareness and problem solving.  He completes ADLs with min- total assist, min-mod +2 for transfers using RW.  He is on 6L supplemental O2 via [place] during session with Spo2 ranging from 88-90 at rest with cueing for PLB after activity as SPO2 desaturates to low 80s with minimal activity. Based on performance today, believe he will benefit from SNF level rehab to optimize independence and safety prior to dc home.    Recommendations for follow up therapy are one component of a multi-disciplinary discharge planning process, led by the attending physician.  Recommendations may be updated based on patient status, additional functional criteria and insurance authorization.   Follow Up Recommendations  Skilled nursing-short term rehab (<3 hours/day)    Assistance Recommended at Discharge Frequent or constant Supervision/Assistance  Patient can return home with the following Two people to help with walking and/or transfers;Two people to help with bathing/dressing/bathroom;Direct supervision/assist for medications management;Direct supervision/assist for financial management;Assist for transportation;Help  with stairs or ramp for entrance;Assistance with cooking/housework    Functional Status Assessment  Patient has had a recent decline in their functional status and demonstrates the ability to make significant improvements in function in a reasonable and predictable amount of time.  Equipment Recommendations  BSC/3in1    Recommendations for Other Services       Precautions / Restrictions Precautions Precautions: Fall;Other (comment) Precaution Comments: Impulsive Restrictions Weight Bearing Restrictions: No      Mobility Bed Mobility Overal bed mobility: Needs Assistance Bed Mobility: Supine to Sit     Supine to sit: Min guard     General bed mobility comments: for safety, HOB elevated    Transfers                          Balance Overall balance assessment: Needs assistance Sitting-balance support: No upper extremity supported, Feet supported Sitting balance-Leahy Scale: Good     Standing balance support: Bilateral upper extremity supported, During functional activity Standing balance-Leahy Scale: Poor Standing balance comment: relies on BUE and external support                           ADL either performed or assessed with clinical judgement   ADL Overall ADL's : Needs assistance/impaired     Grooming: Set up;Sitting;Wash/dry face           Upper Body Dressing : Minimal assistance;Sitting   Lower Body Dressing: +2 for physical assistance;+2 for safety/equipment;Sit to/from stand;Maximal assistance Lower Body Dressing Details (indicate cue type and reason): min-mod +2 sit to stand, able to reach socks but fatigues easily and requires maximal rest breaks Toilet Transfer: Moderate assistance;Minimal assistance;+2 for physical assistance;+2 for safety/equipment Toilet Transfer Details (indicate cue type and reason): from elevated EOB min+2, mod +  2 from recliner with mod cueing for safety, sequencing Toileting- Clothing Manipulation and  Hygiene: Total assistance;+2 for safety/equipment;Sit to/from stand Toileting - Clothing Manipulation Details (indicate cue type and reason): hygiene     Functional mobility during ADLs: Minimal assistance;Moderate assistance;+2 for safety/equipment;+2 for physical assistance;Rolling walker (2 wheels)       Vision   Vision Assessment?: No apparent visual deficits     Perception     Praxis      Pertinent Vitals/Pain Pain Assessment Pain Assessment: No/denies pain     Hand Dominance Right   Extremity/Trunk Assessment Upper Extremity Assessment Upper Extremity Assessment: Generalized weakness   Lower Extremity Assessment Lower Extremity Assessment: Defer to PT evaluation       Communication Communication Communication: No difficulties   Cognition Arousal/Alertness: Awake/alert Behavior During Therapy: Impulsive Overall Cognitive Status: Impaired/Different from baseline Area of Impairment: Attention, Memory, Following commands, Safety/judgement, Awareness, Problem solving                   Current Attention Level: Sustained Memory: Decreased short-term memory, Decreased recall of precautions Following Commands: Follows multi-step commands with increased time Safety/Judgement: Decreased awareness of safety, Decreased awareness of deficits Awareness: Emergent Problem Solving: Slow processing, Requires verbal cues, Difficulty sequencing       General Comments  pt on 6L [place] during session, cueing for PLB during session.  Ranged from 88-90%, with activity desaturates quickly to low 80s (but poor waveform)    Exercises     Shoulder Instructions      Home Living Family/patient expects to be discharged to:: Private residence Living Arrangements: Alone Available Help at Discharge: Family;Friend(s);Available PRN/intermittently Type of Home: House Home Access: Stairs to enter [employer] of Steps: 3+1 Entrance Stairs-Rails: None Home Layout: One  level     Bathroom Shower/Tub: Chief Strategy Officer: Standard     Home Equipment: Grab bars - tub/shower;BSC/3in1;Rolling Walker (2 wheels)   Additional Comments: Pt on 4-6 L at home; Pulse oximeter.      Prior Functioning/Environment Prior Level of Function : Independent/Modified Independent;Driving             Mobility Comments: Pt typically ambulates without assistive device. ADLs Comments: independent with ADLs, drives; has "[employer]" to help with checking vitals/medication; friends/family assist with IADLs as needed        OT Problem List: Decreased strength;Decreased activity tolerance;Impaired balance (sitting and/or standing);Decreased cognition;Decreased safety awareness;Decreased knowledge of use of DME or AE;Decreased knowledge of precautions;Cardiopulmonary status limiting activity;Obesity      OT Treatment/Interventions: Self-care/ADL training;Therapeutic exercise;DME and/or AE instruction;Energy conservation;Therapeutic activities;Cognitive remediation/compensation;Patient/family education;Balance training    OT Goals(Current goals can be found in the care plan section) Acute Rehab OT Goals Patient Stated Goal: get better OT Goal Formulation: With patient Time For Goal Achievement: 09/02/21 Potential to Achieve Goals: Good  OT Frequency: Min 2X/week    Co-evaluation              AM-PAC OT "6 Clicks" Daily Activity     Outcome Measure Help from another person eating meals?: A Little Help from another person taking care of personal grooming?: A Little Help from another person toileting, which includes using toliet, bedpan, or urinal?: Total Help from another person bathing (including washing, rinsing, drying)?: A Lot Help from another person to put on and taking off regular upper body clothing?: A Little Help from another person to put on and taking off regular lower body clothing?: A Lot 6 Click Score: 14  End of Session Equipment  Utilized During Treatment: Rolling walker (2 wheels);Gait belt;Oxygen Nurse Communication: Mobility status;Precautions  Activity Tolerance: Patient tolerated treatment well Patient left: in chair;with call bell/phone within reach;with chair alarm set  OT Visit Diagnosis: Other abnormalities of gait and mobility (R26.89);Muscle weakness (generalized) (M62.81);Other symptoms and signs involving cognitive function                Time: 1002-1039 OT Time Calculation (min): 37 min Charges:  OT General Charges $OT Visit: 1 Visit OT Evaluation $OT Eval Moderate Complexity: 1 Mod  Barry Brunner, OT Acute Rehabilitation Services Pager [phone] Office [phone]   Chancy Milroy 08/19/2021, 12:01 PM

## 2021-08-19 NOTE — Plan of Care (Signed)
?  Interdisciplinary Goals of Care Family Meeting ? ? ?Date carried out:: 08/19/2021 ? ?Location of the meeting: Bedside ? ?Member's involved: Nurse Practitioner, Bedside Registered Nurse, and Other: Patient bedside nurse [name] present for conversation. ? ?Durable Power of Insurance risk surveyor: Patient   ? ?Discussion: We discussed goals of care with Zachary Hawkins.  Explained recent hospitalization including intubation.  Asked patient that if his respiratory status were to deteriorate would he want to undergo intubation again after his past experience. ? ?Zachary Hawkins expressed that he would like full scope of care including intubation and chest compressions. ? ?Code status: Full Code ? ?Disposition: Continue current acute care ? ? ?Time spent for the meeting: 7 minutes ? ?[name] ?08/19/2021, 10:43 AM ? ?

## 2021-08-19 NOTE — Progress Notes (Signed)
Pt placed on BIPAP 14/6 with a rate of 15 tolerating well at this time.  RT will continue to monitor. ?

## 2021-08-19 NOTE — Progress Notes (Signed)
Nutrition Follow-up ? ?DOCUMENTATION CODES:  ? ?Morbid obesity ? ?INTERVENTION:  ? ?- Encourage PO intake ? ?- Add MVI with minerals daily ? ?NUTRITION DIAGNOSIS:  ? ?Inadequate oral intake related to inability to eat as evidenced by NPO status. ? ?Progressing, pt now on Heart Healthy/Carb Modified diet ? ?GOAL:  ? ?Patient will meet greater than or equal to 90% of their needs ? ?Progressing ? ?MONITOR:  ? ?PO intake, Supplement acceptance, Labs, Weight trends, I & O's ? ?REASON FOR ASSESSMENT:  ? ?Ventilator, Consult ?Enteral/tube feeding initiation and management ? ?ASSESSMENT:  ? ?66 year old male who presented to the ED on 4/07 with SOB. PMH of CHF, COPD, HTN, OSA, PAF, CKD stage III, HLD. Pt required intubation in the ED. Pt admitted with COPD exacerbation. ? ?04/11 - extubated ? ?Discussed pt with RN and during ICU rounds. ? ?Spoke with pt at bedside. Pt reports having a big appetite and eating "like a horse" after diet advancement. Pt reports consuming 100% of breakfast meal this morning. Given excellent PO intake, will not order oral nutrition supplement at this time. RD to order MVI with minerals daily. ? ?Pt reports that he typically eats a lot of fruits and vegetables. Pt also reports consuming roasted chicken. Pt mainly uses salt-free seasonings like Mrs. Dash. ? ?Pt reports having some gas/belching today and slight abdominal discomfort due to distention. Pt reports that this has not affected his appetite at all. ? ?Admit weight: 155.7 kg ?Current weight: 155.5 kg ? ?Pt continues to have some non-pitting edema to BUE and mild pitting edema to BLE. ? ?Meal Completion: 100% x 2 documented meals ? ?Medications reviewed and include: SSI q 4 hours, IV solu-medrol, protonix, senna ? ?Labs reviewed: BUN 50, creatinine 1.45, magnesium 2.9 ?CBG's: 98-143 x 24 hours ? ?UOP: 2500 ml x 24 hours ?I/O's: -2.3 L since admit ? ?Diet Order:   ?Diet Order   ? ?       ?  Diet heart healthy/carb modified Room service  appropriate? Yes; Fluid consistency: Thin  Diet effective now       ?  ? ?  ?  ? ?  ? ? ?EDUCATION NEEDS:  ? ?Education needs have been addressed ? ?Skin:  Skin Assessment: Reviewed RN Assessment ? ?Last BM:  08/18/21 multiple type 7 ? ?Height:  ? ?Ht Readings from Last 1 Encounters:  ?08/15/21 6\' 1"  (1.854 m)  ? ? ?Weight:  ? ?Wt Readings from Last 1 Encounters:  ?08/19/21 (!) 155.5 kg  ? ? ?Ideal Body Weight:  83.6 kg ? ?BMI:  Body mass index is 45.23 kg/m?. ? ?Estimated Nutritional Needs:  ? ?Kcal:  2400-2600 ? ?Protein:  120-140 grams ? ?Fluid:  2.0 L/day ? ? ? ?Gustavus Bryant, MS, RD, LDN ?Inpatient Clinical Dietitian ?Please see AMiON for contact information. ? ?

## 2021-08-19 NOTE — Progress Notes (Signed)
? ? ? ? ?NAME:  Zachary Hawkins, MRN:  [id number], DOB:  1955/08/25, LOS: 5 ?ADMISSION DATE:  08/14/2021, CONSULTATION DATE:  4/7 ?REFERRING MD:  Dr. Laverta Baltimore, CHIEF COMPLAINT:  COPD exacerbation  ? ?History of Present Illness:  ?Patient is a 66 year old male with pertinent PMH of HFrEF with cardiomyopathy, COPD on 4 LNC at home, OSA, HTN, PAF presents to [facility] ED on 4/7 with SOB/AMS. ? ?EMS called to patient's house on 4/7 due to patient having SOB.  Upon arrival EMS states that patient sats were 50% and patient was altered found on room air.  Patient was also found with a bottle of pain medicine which was mostly empty. Patient given DuoNebs, 125 Solu-Medrol, epi, mag, and started on CPAP.  Initially EMS states that the patient refused to come to hospital but after 30 minutes of discussion decided to come in for further evaluation.  EMS states that he is intermittently compliant with home oxygen and is supposed to wear CPAP but never does. Patient transported to [facility] ED. ? ?Patient arrived to [facility] ED on 4/7.  Patient denies chest pain/fever.  Patient's breathing is initially appeared more comfortable on CPAP and sats stable.  Afebrile and BP mildly hypertensive.  EKG with sinus tach rate 104.  CXR appears unchanged from previous CXRs, showing stable appearance of chest with percent stent cardiomegaly and bibasilar atelectasis.  BNP 19.5.  COVID flu negative.  Initial VBG 7.55, CO2 50, PO2 45, HCO3 44.  K 5.8, glucose 142, BUN 18, creatinine 1.61 (baseline 1.5).  Troponin 16.  WBC 9.8, Hgb 13.8.  Patient's mental status or altered.  Repeat ABG showing pH 7.2, CO2 greater 123, PO2 152, HCO3 48.  Patient intubated in ED.  PCCM consulted for ICU admission. ? ?Pertinent  Medical History  ? ?Past Medical History:  ?Diagnosis Date  ? Asthma   ? BPH (benign prostatic hyperplasia)   ? Cardiomyopathy ([place]) 06/2017  ? EF 40-45%  ? CHF (congestive heart failure) ([place])   ? Chronic renal insufficiency, stage 3 (moderate) (HCC)   ? COPD  (chronic obstructive pulmonary disease) ([place])   ? Diastolic dysfunction [id number]  ? grade 2   ? Hyperlipidemia   ? Hypertension   ? Hypertensive urgency   ? Obesity   ? Obesity   ? OSA (obstructive sleep apnea)   ? Paroxysmal atrial fibrillation ([place]) 2022  ? on Eliquis  ? ?Significant Hospital Events: ?Including procedures, antibiotic start and stop dates in addition to other pertinent events   ?4/7: admitted to [facility] for copd exacerbation resp failure; intubated ?4/8: Stable overnight ?4/11: Extubated ? ? ?Interim History / Subjective:  ?24-hour events ?Tmax 98.4 ?X2 stool, 2.5 L urine output, -2.6 L admission ?Tolerating diet ?Recs for SNF, short-term rehab ? ?Subjective: Complains of some shortness of breath.  Denies chest pain ? ?Objective   ?Blood pressure (!) 160/99, pulse 95, temperature 98.6 ?F (37 ?C), temperature source Oral, resp. rate 17, height 6\' 1"  (1.854 m), weight (!) 155.5 kg, SpO2 (!) 89 %.  8 LHFNC ?   ?Vent Mode: BIPAP;PCV ?FiO2 (%):  [40 %] 40 % ?Set Rate:  [1 bmp-10 bmp] 1 bmp ?PEEP:  [5 cmH20] 5 cmH20 ?Pressure Support:  [5 cmH20] 5 cmH20  ? ?Intake/Output Summary (Last 24 hours) at 08/19/2021 0929 ?Last data filed at 08/19/2021 [id number] ?Gross per 24 hour  ?Intake 770.22 ml  ?Output 2500 ml  ?Net -1729.78 ml  ? ?Filed Weights  ? 08/17/21 0500 08/18/21 0500 08/19/21  0402  ?Weight: (!) 157.5 kg (!) 155.5 kg (!) 155.5 kg  ? ? ?Examination: ?General: In bed, NAD, appears comfortable ?HEENT: MM pink/moist, anicteric, atraumatic ?Neuro: RASS 0, PERRL 80mm, GCS 15 ?CV: S1S2, NSR, no m/r/g appreciated ?PULM: Air movement in all lobes, trachea midline, chest expansion symmetric ?GI: Rounded, bsx4 active, non-tender   ?Extremities: warm/dry, no pretibial edema, capillary refill less than 3 seconds  ?Skin:  no rashes or lesions noted ? ?Labs and imaging: ?Pertinent labs: ?CBC reviewed ?BUN 61> 50, creatinine 1.5> 1.45 ?Mag 2.9, Phos 4.2 ?Blood glucose 98-142 ? ?Resolved Hospital Problem list   ? ? ?Assessment &  Plan:  ? ?Acute on chronic hypoxemic/hypercapnic respiratory failure ?Acute COPD exacerbation, on 4LNC at home  ?Obstructive sleep apnea, noncompliant with CPAP ?Morbid obesity ?On 8 L high flow nasal cannula ?-BiPAP nightly.  Ensure patient wears BiPAP. ?-Aggressive pulmonary toilet. ?-Out of bed as able ?-Wean oxygen.  Goal sat 88 to 96%.  On 4 L nasal cannula at baseline. ? ?Acute metabolic encephalopathy with severe agitation- resolved ?Suspect secondary to ETT being in place.  ?-Monitor exam ? ?Chronic diastolic heart failure ?-2.6 L admission.  AKI. ?-Hold home torsemide, spironolactone, Farxiga,  ?-Continue bisoprolol ?-Resume amlodipine ? ?Paroxysmal atrial fibrillation ?Heart rate remain well controlled, currently in NSR ?-Continue bisoprolol ?-Continue eliquis ? ?Chronic kidney disease stage IIIa ?Hyperkalemia-resolved ?BUN 61> 50, creatinine 1.5> 1.45 ?-Ensure renal perfusion. Goal MAP 65 or greater. ?-Avoid neprotoxic drugs as possible. ?-Strict I&O's ?-Follow up AM creatinine ? ?Constipation-improved ?Last bowel movement 4/11.  ?-Continue senna ?-Monitor ? ?HX HTN ?-Resume amlodipine ?-Continue as needed hydralazine ? ?BPH ?-Continue Cardura ? ?Stable for transfer to progressive care.  Will sign out to [facility]. ? ?Best Practice (right click and "Reselect all SmartList Selections" daily)  ? ?Diet/type: Regular consistency (see orders) ?DVT prophylaxis: DOAC ?GI prophylaxis: PPI ?Lines: N/A ?Foley:  N/A ?Code Status:  full code ?Last date of multidisciplinary goals of care discussion [Full scope] ? ?Critical care time: NA ? ?Redmond School., MSN, APRN, AGACNP-BC ?[facility]  ?08/19/2021 , 9:29 AM ? ?Please see [email or web address] for pager details ? ?If no response, please call [phone] ?After hours, please call Elink at [phone] ? ? ? ? ? ?

## 2021-08-20 ENCOUNTER — Other Ambulatory Visit (HOSPITAL_COMMUNITY): Payer: Self-pay

## 2021-08-20 DIAGNOSIS — J9601 Acute respiratory failure with hypoxia: Secondary | ICD-10-CM | POA: Diagnosis not present

## 2021-08-20 LAB — BASIC METABOLIC PANEL
Anion gap: 7 (ref 5–15)
BUN: 48 mg/dL — ABNORMAL HIGH (ref 8–23)
CO2: 35 mmol/L — ABNORMAL HIGH (ref 22–32)
Calcium: 8.8 mg/dL — ABNORMAL LOW (ref 8.9–10.3)
Chloride: 96 mmol/L — ABNORMAL LOW (ref 98–111)
Creatinine, Ser: 1.63 mg/dL — ABNORMAL HIGH (ref 0.61–1.24)
GFR, Estimated: 47 mL/min — ABNORMAL LOW (ref >60)
Glucose, Bld: 126 mg/dL — ABNORMAL HIGH (ref 70–99)
Potassium: 4.4 mmol/L (ref 3.5–5.1)
Sodium: 138 mmol/L (ref 135–145)

## 2021-08-20 LAB — GLUCOSE, CAPILLARY
Glucose-Capillary: 120 mg/dL — ABNORMAL HIGH (ref 70–99)
Glucose-Capillary: 145 mg/dL — ABNORMAL HIGH (ref 70–99)
Glucose-Capillary: 173 mg/dL — ABNORMAL HIGH (ref 70–99)

## 2021-08-20 LAB — CBC
HCT: 45 % (ref 39.0–52.0)
Hemoglobin: 13.3 g/dL (ref 13.0–17.0)
MCH: 28.1 pg (ref 26.0–34.0)
MCHC: 29.6 g/dL — ABNORMAL LOW (ref 30.0–36.0)
MCV: 94.9 fL (ref 80.0–100.0)
Platelets: 247 10*3/uL (ref 150–400)
RBC: 4.74 MIL/uL (ref 4.22–5.81)
RDW: 15.8 % — ABNORMAL HIGH (ref 11.5–15.5)
WBC: 8.7 10*3/uL (ref 4.0–10.5)
nRBC: 0 % (ref 0.0–0.2)

## 2021-08-20 MED ORDER — SPIRONOLACTONE 25 MG PO TABS
25.0000 mg | ORAL_TABLET | Freq: Every day | ORAL | Status: DC
  Administered 2021-08-20 – 2021-08-21 (×2): 25 mg via ORAL
  Filled 2021-08-20 (×2): qty 1

## 2021-08-20 MED ORDER — MUPIROCIN 2 % EX OINT
TOPICAL_OINTMENT | Freq: Two times a day (BID) | CUTANEOUS | Status: DC
  Filled 2021-08-20: qty 22

## 2021-08-20 NOTE — TOC Initial Note (Addendum)
Transition of Care (TOC) - Initial/Assessment Note  ? ? ?Patient Details  ?Name: Zachary Hawkins ?MRN: [id number] ?Date of Birth: Feb 28, 1956 ? ?Transition of Care (TOC) CM/SW Contact:    ?Reece Agar, LCSWA ?Phone Number: ?08/20/2021, 1:20 PM ? ?Clinical Narrative:                 ?CSW received SNF consult. CSW met with pt via phone. CSW introduced self and explained role at the hospital. Pt reports that PTA the pt lived home alone. PT minA+2/ModA with assistance for ADLS. Pt uses a 2 wheeled rolling walker.  ? ?CSW reviewed PT/OT recommendations for SNF. Pt reports wanting to go to SNF in order to safely transition home. Pt gave CSW permission to fax out to facilities in the area. Pt has no preference of facility at this time. CSW gave pt medicare.gov rating list to review.  ? ?CSW contacted [facility] for bed offer admissions will review pt and follow up with CSW if bed offer is made. CSW also spoke with pt city block case manger who will update the insurance on pt going to SNF but they do not assist with placement. ? ?CSW will continue to follow. ? ? ? ?  ?  ? ? ?Patient Goals and CMS Choice ?  ?  ?  ? ?Expected Discharge Plan and Services ?  ?  ?  ?  ?  ?                ?  ?  ?  ?  ?  ?  ?  ?  ?  ?  ? ?Prior Living Arrangements/Services ?  ?  ?  ?       ?  ?  ?  ?  ? ?Activities of Daily Living ?  ?  ? ?Permission Sought/Granted ?  ?  ?   ?   ?   ?   ? ?Emotional Assessment ?  ?  ?  ?  ?  ?  ? ?Admission diagnosis:  Acute hypoxemic respiratory failure ([place]) [J96.01] ?Acute on chronic respiratory failure with hypoxia and hypercapnia (HCC) [J96.21, J96.22] ?Patient Active Problem List  ? Diagnosis Date Noted  ? Acute hypoxemic respiratory failure ([place]) 08/14/2021  ? Acute encephalopathy   ? Chronic diastolic heart failure ([place])   ? Chronic obstructive pulmonary disease with acute exacerbation ([place])   ? Hyperkalemia   ? Class 3 obesity ([place]) 07/18/2021  ? Acute on chronic diastolic heart failure ([place]) 07/17/2021   ? BPH (benign prostatic hyperplasia)   ? Hyperlipidemia   ? Acute on chronic respiratory failure with hypoxia and hypercapnia ([place]) 06/23/2021  ? Chronic diastolic CHF (congestive heart failure) ([place]) 06/23/2021  ? COPD with acute exacerbation ([place]) 06/23/2021  ? Acute on chronic diastolic (congestive) heart failure ([place]) 04/30/2021  ? AKI (acute kidney injury) ([place]) 04/29/2021  ? Acute exacerbation of CHF (congestive heart failure) ([place]) 04/29/2021  ? Chronic respiratory failure with hypercapnia ([place]) 04/29/2021  ? Bilateral hydrocele 04/29/2021  ? Dyspnea on exertion 04/29/2021  ? Acute respiratory failure with hypoxia ([place]) 03/10/2021  ? Urticaria 01/15/2021  ? Chronic heart failure with preserved ejection fraction ([place])   ? Angioedema 01/04/2021  ? Influenza vaccine refused 06/26/2020  ? COVID-19 vaccine series completed 06/26/2020  ? Paroxysmal atrial fibrillation ([place]) 07/02/2019  ? Secondary hypercoagulable state ([place]) 07/02/2019  ? OSA (obstructive sleep apnea) 04/03/2019  ? History of angioedema due to ACE INHIBITORS 10/13/2017  ? Non compliance  w medication regimen 07/13/2017  ? Essential hypertension 06/23/2017  ? Acute kidney injury superimposed on chronic kidney disease ([place])   ? Cardiomyopathy ([place]) 06/06/2017  ? Dyspnea 06/05/2017  ? ?PCP:  Dorise Hiss, PA-C ?Pharmacy:   ?[facility] ?[address] ?[place] [address] ?Phone: [phone] Fax: [phone] ? ? ? ? ?Social Determinants of Health (SDOH) Interventions ?  ? ?Readmission Risk Interventions ? ?  06/25/2021  ?  3:22 PM 05/04/2021  ? 10:03 AM 03/13/2021  ? 12:35 PM  ?Readmission Risk Prevention Plan  ?Transportation Screening Complete Complete Complete  ?PCP or Specialist Appt within 3-5 Days  Complete   ?[place] or Home Care Consult  Complete   ?Social Work Consult for [place] Planning/Counseling  Complete   ?Palliative Care Screening  Not Applicable   ?Medication Review Press photographer)  Complete Complete Complete  ?PCP or Specialist appointment within 3-5 days of discharge Complete  Complete  ?[place] or Home Care Consult Complete  Complete  ?SW Recovery Care/Counseling Consult Complete  Complete  ?Palliative Care Screening Not Applicable  Not Applicable  ?Skilled Nursing Facility Complete  Complete  ? ? ? ?

## 2021-08-20 NOTE — TOC Benefit Eligibility Note (Signed)
Patient Advocate Encounter ? ?Insurance verification completed.   ? ?The patient is currently admitted and upon discharge could be taking Farxiga 10 mg. ? ?The current 30 day co-pay is, $4.00.  ? ?The patient is currently admitted and upon discharge could be taking Jardiance 10 mg. ? ?Requires Prior Authorization ? ?The patient is insured through [employer] [place]  ? ? ? ?Roland Earl, CPhT ?Pharmacy Patient Advocate Specialist ?[facility] Patient Advocate Team ?Direct Number: [phone]  Fax: [phone] ? ? ? ? ? ?  ?

## 2021-08-20 NOTE — Progress Notes (Signed)
SATURATION QUALIFICATIONS:  ? ?Patient Saturations on Room Air at Rest = 84% ? ?Patient Saturations on Room Air while Ambulating = 79% ? ?Patient Saturations on 8 Liters of oxygen while Ambulating = 90% ? ?

## 2021-08-20 NOTE — NC FL2 (Signed)
?[place] MEDICAID FL2 LEVEL OF CARE SCREENING TOOL  ?  ? ?IDENTIFICATION  ?Patient Name: ?Zachary Hawkins Birthdate: 01/01/1956 Sex: male Admission Date (Current Location): ?08/14/2021  ?[place] Number: ? [place] ?  Facility and Address:  ?[facility], [address], [place], [place] [address] ?     Provider Number: ?[id number]  ?Attending Physician Name and Address:  ?Rhetta Mura, MD ? Relative Name and Phone Number:  ?  ?   ?Current Level of Care: ?Hospital Recommended Level of Care: ?Skilled Nursing Facility Prior Approval Number: ?  ? ?Date Approved/Denied: ?  PASRR Number: ?[id number] A ? ?Discharge Plan: ?SNF ?  ? ?Current Diagnoses: ?Patient Active Problem List  ? Diagnosis Date Noted  ? Acute hypoxemic respiratory failure (HCC) 08/14/2021  ? Acute encephalopathy   ? Chronic diastolic heart failure (HCC)   ? Chronic obstructive pulmonary disease with acute exacerbation (HCC)   ? Hyperkalemia   ? Class 3 obesity (HCC) 07/18/2021  ? Acute on chronic diastolic heart failure (HCC) 07/17/2021  ? BPH (benign prostatic hyperplasia)   ? Hyperlipidemia   ? Acute on chronic respiratory failure with hypoxia and hypercapnia (HCC) 06/23/2021  ? Chronic diastolic CHF (congestive heart failure) (HCC) 06/23/2021  ? COPD with acute exacerbation (HCC) 06/23/2021  ? Acute on chronic diastolic (congestive) heart failure (HCC) 04/30/2021  ? AKI (acute kidney injury) (HCC) 04/29/2021  ? Acute exacerbation of CHF (congestive heart failure) (HCC) 04/29/2021  ? Chronic respiratory failure with hypercapnia (HCC) 04/29/2021  ? Bilateral hydrocele 04/29/2021  ? Dyspnea on exertion 04/29/2021  ? Acute respiratory failure with hypoxia (HCC) 03/10/2021  ? Urticaria 01/15/2021  ? Chronic heart failure with preserved ejection fraction (HCC)   ? Angioedema 01/04/2021  ? Influenza vaccine refused 06/26/2020  ? COVID-19 vaccine series completed 06/26/2020  ? Paroxysmal atrial fibrillation (HCC)  07/02/2019  ? Secondary hypercoagulable state (HCC) 07/02/2019  ? OSA (obstructive sleep apnea) 04/03/2019  ? History of angioedema due to ACE INHIBITORS 10/13/2017  ? Non compliance w medication regimen 07/13/2017  ? Essential hypertension 06/23/2017  ? Acute kidney injury superimposed on chronic kidney disease (HCC)   ? Cardiomyopathy (HCC) 06/06/2017  ? Dyspnea 06/05/2017  ? ? ?Orientation RESPIRATION BLADDER Height & Weight   ?  ?Self, Time, Situation, Place ? Normal External catheter, Continent Weight: (!) 333 lb 5.4 oz (151.2 kg) ?Height:  6\' 1"  (185.4 cm)  ?BEHAVIORAL SYMPTOMS/MOOD NEUROLOGICAL BOWEL NUTRITION STATUS  ?    Continent Diet  ?AMBULATORY STATUS COMMUNICATION OF NEEDS Skin   ?Extensive Assist Verbally Normal ?  ?  ?  ?    ?     ?     ? ? ?Personal Care Assistance Level of Assistance  ?Bathing, Feeding, Dressing Bathing Assistance: Maximum assistance ?Feeding assistance: Limited assistance ?Dressing Assistance: Maximum assistance ?   ? ?Functional Limitations Info  ?Hearing, Speech, Sight Sight Info: Adequate ?Hearing Info: Adequate ?Speech Info: Adequate  ? ? ?SPECIAL CARE FACTORS FREQUENCY  ?OT (By licensed OT), PT (By licensed PT)   ?  ?PT Frequency: 5x a week ?OT Frequency: 5x a week ?  ?  ?  ?   ? ? ?Contractures    ? ? ?Additional Factors Info  ?Code Status, Allergies Code Status Info: Full ?Allergies Info: Extract (Cherry Extract)   [employer]) ?  ?  ?  ?   ? ?Current Medications (08/20/2021):  This is the current hospital active medication  list ?Current Facility-Administered Medications  ?Medication Dose Route Frequency Provider Last Rate Last Admin  ? amLODipine (NORVASC) tablet 5 mg  5 mg Oral Daily [name], NP   5 mg at 08/20/21 0800  ? apixaban (ELIQUIS) tablet 5 mg  5 mg Oral BID Cheri Fowler, MD   5 mg at 08/20/21 0800  ? atorvastatin (LIPITOR) tablet 10 mg  10 mg Oral Daily [place], [place]nie [place], [place]   10 mg at 08/20/21 0800  ?  bisoprolol (ZEBETA) tablet 5 mg  5 mg Oral Daily [place], [place], [place]   5 mg at 08/20/21 0800  ? budesonide (PULMICORT) nebulizer solution 0.25 mg  0.25 mg Nebulization BID Lidia Collum, PA-C   0.25 mg at 08/20/21 7672  ? Chlorhexidine Gluconate Cloth 2 % PADS 6 each  6 each Topical Q2000 Cheri Fowler, MD   6 each at 08/20/21 0419  ? dapagliflozin propanediol (FARXIGA) tablet 10 mg  10 mg Oral Daily [name], NP   10 mg at 08/20/21 0800  ? doxazosin (CARDURA) tablet 2 mg  2 mg Oral Daily [place], [place], [place]   2 mg at 08/20/21 0800  ? fluticasone (FLONASE) 50 MCG/ACT nasal spray 2 spray  2 spray Each Nare Daily Karl Ito, MD   2 spray at 08/20/21 0801  ? hydrALAZINE (APRESOLINE) injection 10-40 mg  10-40 mg Intravenous Q4H PRN Lidia Collum, PA-C   20 mg at 08/17/21 1202  ? insulin aspart (novoLOG) injection 0-5 Units  0-5 Units Subcutaneous QHS Cheri Fowler, MD      ? insulin aspart (novoLOG) injection 0-9 Units  0-9 Units Subcutaneous TID WC Cheri Fowler, MD   1 Units at 08/19/21 1759  ? ipratropium-albuterol (DUONEB) 0.5-2.5 (3) MG/3ML nebulizer solution 3 mL  3 mL Nebulization Q2H PRN Pia Mau D, PA-C      ? ipratropium-albuterol (DUONEB) 0.5-2.5 (3) MG/3ML nebulizer solution 3 mL  3 mL Nebulization TID Cheri Fowler, MD   3 mL at 08/20/21 0844  ? MEDLINE mouth rinse  15 mL Mouth Rinse BID Lorin Glass, MD   15 mL at 08/20/21 0801  ? multivitamin with minerals tablet 1 tablet  1 tablet Oral Daily Cheri Fowler, MD   1 tablet at 08/20/21 0800  ? mupirocin ointment (BACTROBAN) 2 %   Nasal BID Rhetta Mura, MD   Given at 08/20/21 1224  ? ondansetron (ZOFRAN) injection 4 mg  4 mg Intravenous Q6H PRN Charlotte Sanes, MD   4 mg at 08/17/21 0841  ? pantoprazole (PROTONIX) EC tablet 40 mg  40 mg Oral Daily [address] [place], [place]   40 mg at 08/20/21 0800  ? senna (SENOKOT) tablet 8.6 mg  1 tablet Oral Daily Cheri Fowler, MD   8.6 mg at 08/20/21 0800  ? sodium chloride (OCEAN)  0.65 % nasal spray 2 spray  2 spray Each Nare PRN Karl Ito, MD   2 spray at 08/18/21 0947  ? spironolactone (ALDACTONE) tablet 25 mg  25 mg Oral Daily Rhetta Mura, MD   25 mg at 08/20/21 1224  ? torsemide (DEMADEX) tablet 80 mg  80 mg Oral Daily Cheri Fowler, MD   80 mg at 08/20/21 0800  ? ? ? ?Discharge Medications: ?Please see discharge summary for a list of discharge medications. ? ?Relevant Imaging Results: ? ?Relevant Lab Results: ? ? ?Additional Information ?SSN [id number] ? ?Jimmy Picket, LCSW ? ? ? ? ?

## 2021-08-20 NOTE — Progress Notes (Signed)
?PROGRESS NOTE ? ? ?Zachary Hawkins  [id number] DOB: 03/09/1956 DOA: 08/14/2021 ?PCP: Dorise Hiss, PA-C  ?Brief Narrative:  ?66 year-old home dwelling.male ?BMI 43 (prior baseline weight 290 pounds) chronic hypoxic respiratory failure at baseline 4 L oxygen ?HFpEF ?PAH as well as obstructive sleep apnea very intermittently compliant on CPAP at home ?Permanent A-fib ?CKD 3 AA ??  ACE versus black cherry angioedema ?  ?Developed SOB 4/7 sats in the 50s on room air patient altered-found to have pain medication bottle next time which was almost empty ?Initial CO2 >123 ?Rx DuoNeb 125 6 mg Solu-Medrol epi mag ?Admitted to ICU started on mechanical ventilation per their protocol ?Extubated 4/11 and diuresed with IV Lasix -2.6 L ? ?Hospital-Problem based course ? ?Acute type II respiratory failure on admission 2/2 noncompliance with CPAP, OSA and unintentional pain medication overdose ?Continue efforts at BiPAP at night--I have counseled him strongly about compliance ?Continue Pulmicort 0.25 twice daily DuoNeb every 2 as needed, DuoNeb 3 times daily in addition ??  Underlying COPD-chronic respiratory failure at home 4 L nasal oxygen ?No history of smoking rapidly taper off of steroids ?HFpEF + PAH ?Aim to keep patient dry continue torsemide 80 daily ?I/o= -3.8 L, weight 155-->151 kg ?Continue Wilder Glade as GDMT ?Prior to admission was also on Aldactone 25 ?The patient has a history of angioedema to ACE versus black cherry in the past would hesitate to add anything containing ACE inhibitors to his regimen ?Permanent A-fib CHADS2 score >4 on Eliquis ?Continue bisoprolol 5 daily for rate control ?HTN ?Relatively well controlled continue regimen including amlodipine 5, bisoprolol as above, Cardura 2 mg daily, can use as needed hydralazine IV if needed ?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 ?DVT prophylaxis: Eliquis twice daily ?Code Status: Full ?Family Communication: None present this morning ?Disposition:  ?Status is: Inpatient ?Remains inpatient appropriate because:  ? ?Still diuresing and ensuring that he does not go back into respiratory distress ?  ?Consultants:  ?None at this time ? ?Procedures:  ? ? ?Antimicrobials: No ? ? ?Subjective: ? ?Awake coherent no distress sitting up in bed seems comfortable still on high flow cannula which we have asked to transition to a regular-when I put him on 2 L he still maintains his sats but when I take him off he desats to about 85% ?He has mild pitting edema ?He has no chest pain although he is coughing some greenish sputum ?No fevers ? ?Objective: ?Vitals:  ? 08/19/21 2321 08/20/21 0400 08/20/21 0831 08/20/21 0844  ?BP:   121/76   ?Pulse: 91  91   ?Resp: 16 16 19 16   ?Temp:  99.4 ?F (37.4 ?C) 98.5 ?F (36.9 ?C)   ?TempSrc:  Oral Oral   ?SpO2: 99%  100%   ?Weight:  (!) 151.2 kg    ?Height:      ? ? ?Intake/Output Summary (Last 24 hours) at 08/20/2021 0850 ?Last data filed at 08/20/2021 [id number] ?Gross per 24 hour  ?Intake 600 ml  ?Output 2101 ml  ?Net -1501 ml  ? ?Filed Weights  ? 08/18/21 0500 08/19/21 0402 08/20/21 0400  ?Weight: (!) 155.5 kg (!) 155.5 kg (!) 151.2 kg  ? ? ?Examination: ? ?Awake coherent EOMI NCAT no focal deficit ?Cannot appreciate JVD with hepatojugular reflex probably  secondary to habitus he has a very thick neck and Mallampati is 4 ?He has mild crackles posterolaterally on the left side ?Abdomen is very obese I cannot appreciate any organomegaly secondary to habitus ?Trace lower extremity edema ?Euthymic coherent pleasant ? ?Data Reviewed: personally reviewed  ? ?CBC ?   ?Component Value Date/Time  ? WBC 8.7 08/20/2021 0107  ? RBC 4.74 08/20/2021 0107  ? HGB 13.3 08/20/2021 0107  ? HGB 16.7 07/11/2020 1103  ? HCT 45.0 08/20/2021 0107  ? HCT 50.3  07/11/2020 1103  ? PLT 247 08/20/2021 0107  ? PLT 232 07/11/2020 1103  ? MCV 94.9 08/20/2021 0107  ? MCV 88 07/11/2020 1103  ? MCH 28.1 08/20/2021 0107  ? MCHC 29.6 (L) 08/20/2021 0107  ? RDW 15.8 (H) 08/20/2021 0107  ? RDW 13.6 07/11/2020 1103  ? LYMPHSABS 2.5 08/14/2021 1655  ? MONOABS 1.4 (H) 08/14/2021 1655  ? EOSABS 0.4 08/14/2021 1655  ? BASOSABS 0.1 08/14/2021 1655  ? ? ?  Latest Ref Rng & Units 08/20/2021  ?  1:07 AM 08/19/2021  ?  2:20 AM 08/18/2021  ? 11:12 AM  ?CMP  ?Glucose 70 - 99 mg/dL 126   124   121    ?BUN 8 - 23 mg/dL 48   50   61    ?Creatinine 0.61 - 1.24 mg/dL 1.63   1.45   1.50    ?Sodium 135 - 145 mmol/L 138   139   142    ?Potassium 3.5 - 5.1 mmol/L 4.4   4.5   4.4    ?Chloride 98 - 111 mmol/L 96   98   99    ?CO2 22 - 32 mmol/L 35   36   36    ?Calcium 8.9 - 10.3 mg/dL 8.8   9.0   9.1    ? ? ? ?Radiology Studies: ?DG Knee Right Port ? ?Result Date: 08/19/2021 ?CLINICAL DATA:  Pain.  Recent fall. EXAM: PORTABLE RIGHT KNEE - 1-2 VIEW COMPARISON:  None. FINDINGS: Mild medial compartment joint space narrowing. No joint effusion. No acute fracture is seen. No dislocation. IMPRESSION: Mild medial compartment joint space narrowing.  No acute fracture. Electronically Signed   By: Yvonne Kendall M.D.   On: 08/19/2021 14:22   ? ? ?Scheduled Meds: ? amLODipine  5 mg Oral Daily  ? apixaban  5 mg Oral BID  ? atorvastatin  10 mg Oral Daily  ? bisoprolol  5 mg Oral Daily  ? budesonide (PULMICORT) nebulizer solution  0.25 mg Nebulization BID  ? Chlorhexidine Gluconate Cloth  6 each Topical Q2000  ? dapagliflozin propanediol  10 mg Oral Daily  ? doxazosin  2 mg Oral Daily  ? fluticasone  2 spray Each Nare Daily  ? insulin aspart  0-5 Units Subcutaneous QHS  ? insulin aspart  0-9 Units Subcutaneous TID WC  ? ipratropium-albuterol  3 mL Nebulization TID  ? mouth rinse  15 mL Mouth Rinse BID  ? multivitamin with minerals  1 tablet Oral Daily  ? pantoprazole  40 mg Oral Daily  ? [START ON 08/21/2021] predniSONE  30 mg  Oral Q breakfast  ? Followed by  ? [START ON 08/22/2021] predniSONE  20 mg Oral Q breakfast  ? Followed by  ? [START ON 08/23/2021] predniSONE  10 mg Oral Q breakfast  ? senna  1 tablet Oral Daily  ? torsemide  80 mg Oral Daily  ? ?Continuous Infusions: ? ? LOS: 6 days  ? ?  Time spent: 35 ? ?Nita Sells, MD ?[facility] ?To contact the attending provider between 7A-7P or the covering provider during after hours 7P-7A, please log into the web site [email or web address] and access using universal [place] password for that web site. If you do not have the password, please call the hospital operator. ? ?08/20/2021, 8:50 AM  ? ? ?

## 2021-08-21 ENCOUNTER — Other Ambulatory Visit (HOSPITAL_COMMUNITY): Payer: Self-pay

## 2021-08-21 DIAGNOSIS — J9601 Acute respiratory failure with hypoxia: Secondary | ICD-10-CM | POA: Diagnosis not present

## 2021-08-21 LAB — GLUCOSE, CAPILLARY
Glucose-Capillary: 104 mg/dL — ABNORMAL HIGH (ref 70–99)
Glucose-Capillary: 126 mg/dL — ABNORMAL HIGH (ref 70–99)
Glucose-Capillary: 126 mg/dL — ABNORMAL HIGH (ref 70–99)

## 2021-08-21 LAB — COMPREHENSIVE METABOLIC PANEL
ALT: 19 U/L (ref 0–44)
AST: 16 U/L (ref 15–41)
Albumin: 2.8 g/dL — ABNORMAL LOW (ref 3.5–5.0)
Alkaline Phosphatase: 43 U/L (ref 38–126)
Anion gap: 5 (ref 5–15)
BUN: 44 mg/dL — ABNORMAL HIGH (ref 8–23)
CO2: 38 mmol/L — ABNORMAL HIGH (ref 22–32)
Calcium: 8.6 mg/dL — ABNORMAL LOW (ref 8.9–10.3)
Chloride: 95 mmol/L — ABNORMAL LOW (ref 98–111)
Creatinine, Ser: 1.6 mg/dL — ABNORMAL HIGH (ref 0.61–1.24)
GFR, Estimated: 48 mL/min — ABNORMAL LOW (ref >60)
Glucose, Bld: 94 mg/dL (ref 70–99)
Potassium: 3.5 mmol/L (ref 3.5–5.1)
Sodium: 138 mmol/L (ref 135–145)
Total Bilirubin: 1.1 mg/dL (ref 0.3–1.2)
Total Protein: 6.2 g/dL — ABNORMAL LOW (ref 6.5–8.1)

## 2021-08-21 LAB — MAGNESIUM: Magnesium: 2.5 mg/dL — ABNORMAL HIGH (ref 1.7–2.4)

## 2021-08-21 MED ORDER — TORSEMIDE 20 MG PO TABS
80.0000 mg | ORAL_TABLET | Freq: Every day | ORAL | 0 refills | Status: DC
  Filled 2021-08-21: qty 120, 30d supply, fill #0

## 2021-08-21 MED ORDER — IPRATROPIUM-ALBUTEROL 0.5-2.5 (3) MG/3ML IN SOLN
3.0000 mL | Freq: Two times a day (BID) | RESPIRATORY_TRACT | Status: DC
Start: 2021-08-21 — End: 2021-08-21

## 2021-08-21 MED ORDER — DOXAZOSIN MESYLATE 2 MG PO TABS
2.0000 mg | ORAL_TABLET | Freq: Every day | ORAL | 3 refills | Status: DC
  Filled 2021-08-21 – 2021-12-02 (×2): qty 30, 30d supply, fill #0

## 2021-08-21 MED ORDER — BUDESONIDE 0.25 MG/2ML IN SUSP
0.2500 mg | Freq: Two times a day (BID) | RESPIRATORY_TRACT | 12 refills | Status: DC
  Filled 2021-08-21 – 2021-12-02 (×2): qty 60, 15d supply, fill #0

## 2021-08-21 MED ORDER — PANTOPRAZOLE SODIUM 40 MG PO TBEC
40.0000 mg | DELAYED_RELEASE_TABLET | Freq: Every day | ORAL | 3 refills | Status: DC
  Filled 2021-08-21: qty 30, 30d supply, fill #0

## 2021-08-21 NOTE — Progress Notes (Signed)
Pt received o2 tank. Belongings with pt.  Pt was taken to valet entrance by nursing assistant with o2 tank , toc meds and belongings. ?

## 2021-08-21 NOTE — Progress Notes (Signed)
Mobility Specialist Progress Note: ? ? 08/21/21 1200  ?Mobility  ?Activity Ambulated with assistance in hallway  ?Level of Assistance Standby assist, set-up cues, supervision of patient - no hands on  ?Assistive Device Front wheel walker  ?Distance Ambulated (ft) 250 ft  ?Activity Response Tolerated well  ?$Mobility charge 1 Mobility  ? ?Pt eager for mobility session. Required minG assist throughout. X1 short standing rest break taken d/t fatigue. Required cues for PLB, VSS on 4LO2 which is baseline. Left pt sitting up in chair with all needs met.  ? ?  ?Acute Rehab ?Phone: [phone] ?Office Phone: [phone] ? ?

## 2021-08-21 NOTE — TOC Progression Note (Signed)
Transition of Care (TOC) - Progression Note  ? ? ?Patient Details  ?Name: Zachary Hawkins ?MRN: [id number] ?Date of Birth: July 21, 1955 ? ?Transition of Care (TOC) CM/SW Contact  ?Thelma Lorenzetti Wynn Banker, LCSWA ?Phone Number: ?08/21/2021, 2:16 PM ? ?Clinical Narrative:    ?CSW spoke with pt in room about DC plan. Pt does not want to go to a SNF bc he cannot go home on the weekends. Pt states he has a lot of family support and he would like outpatient therapy. Pt would like CSW to contact [facility] case worker at [facility] to provide an update.  ? ? ?  ?  ? ?Expected Discharge Plan and Services ?  ?  ?  ?  ?  ?                ?  ?  ?  ?  ?  ?  ?  ?  ?  ?  ? ? ?Social Determinants of Health (SDOH) Interventions ?  ? ?Readmission Risk Interventions ? ?  06/25/2021  ?  3:22 PM 05/04/2021  ? 10:03 AM 03/13/2021  ? 12:35 PM  ?Readmission Risk Prevention Plan  ?Transportation Screening Complete Complete Complete  ?PCP or Specialist Appt within 3-5 Days  Complete   ?HRI or Home Care Consult  Complete   ?Social Work Consult for Recovery Care Planning/Counseling  Complete   ?Palliative Care Screening  Not Applicable   ?Medication Review Oceanographer) Complete Complete Complete  ?PCP or Specialist appointment within 3-5 days of discharge Complete  Complete  ?HRI or Home Care Consult Complete  Complete  ?SW Recovery Care/Counseling Consult Complete  Complete  ?Palliative Care Screening Not Applicable  Not Applicable  ?Skilled Nursing Facility Complete  Complete  ? ? ?

## 2021-08-21 NOTE — TOC Transition Note (Signed)
Transition of Care (TOC) - CM/SW Discharge Note ? ? ?Patient Details  ?Name: Zachary Hawkins ?MRN: [id number] ?Date of Birth: Jul 15, 1955 ? ?Transition of Care (TOC) CM/SW Contact:  ?Zenon Mayo, RN ?Phone Number: ?08/21/2021, 3:11 PM ? ? ?Clinical Narrative:    ?Per CSW patient wants to go home with outpatient physical therapy and the CSW will contact patient's Case Worker at the [employer].  Patient states he has home oxygen with [employer].  NCM contacted Jermaine, he will have a oxygen tank brought to patient's room for him to go home with.  NCM made referral to outpatient rehab on [address] thru epic.  Patient will call them to get his apt times.  He states his daughter will transport him home today.   ? ? ?  ?  ? ? ?Patient Goals and CMS Choice ?  ?  ?  ? ?Discharge Placement ?  ?           ?  ?  ?  ?  ? ?Discharge Plan and Services ?  ?  ?           ?  ?  ?  ?  ?  ?  ?  ?  ?  ?  ? ?Social Determinants of Health (SDOH) Interventions ?  ? ? ?Readmission Risk Interventions ? ?  06/25/2021  ?  3:22 PM 05/04/2021  ? 10:03 AM 03/13/2021  ? 12:35 PM  ?Readmission Risk Prevention Plan  ?Transportation Screening Complete Complete Complete  ?PCP or Specialist Appt within 3-5 Days  Complete   ?[place] or Home Care Consult  Complete   ?Social Work Consult for [place] Planning/Counseling  Complete   ?Palliative Care Screening  Not Applicable   ?Medication Review Press photographer) Complete Complete Complete  ?PCP or Specialist appointment within 3-5 days of discharge Complete  Complete  ?[place] or Home Care Consult Complete  Complete  ?SW Recovery Care/Counseling Consult Complete  Complete  ?Palliative Care Screening Not Applicable  Not Applicable  ?Skilled Nursing Facility Complete  Complete  ? ? ? ? ? ?

## 2021-08-21 NOTE — Progress Notes (Addendum)
Nursing dc note ? ?Central tele notified of patients dc order. Piv dcd, site unremarkable.dc intructions reviewd with patient. He is alert and oriented and verbalized understanding. Once o2 tank arrives in patients room , patient will call daughter to come and take him home.All belongings and TOC meds given to patient ?

## 2021-08-21 NOTE — Progress Notes (Signed)
Physical Therapy Treatment ?Patient Details ?Name: Zachary Hawkins ?MRN: [id number] ?DOB: [birth year] ?Today's Date: 08/21/2021 ? ? ?History of Present Illness Pt adm 4/7 with COPD exacerbation and respiratory failure. Intubated 4/7 and extubated 4/11.  PMH - COPD with chronic hypoxemic respiratory failure, diastolic heart failure, htn, paroxysmal fibrillation, ckd, morbid obesity ? ?  ?PT Comments  ? ? The pt was agreeable to session, able to complete short distance ambulation in the room at this time but needing from modA of 2 to minG at times due to pt impulsivity and impaired safety awareness. Pt initially standing with minA of 2 and max cues for hand placement, then needing increased assist due to erratic movement of RW and BLE outside BOS needing modA to steady. Once redirected regarding safety and posture with lines/leads, pt able to progress to ~52ft ambulation with minA. Continues to need skilled PT to progress LE strength, activity tolerance, stability, and safety awareness.  ?   ?Recommendations for follow up therapy are one component of a multi-disciplinary discharge planning process, led by the attending physician.  Recommendations may be updated based on patient status, additional functional criteria and insurance authorization. ? ?Follow Up Recommendations ? Skilled nursing-short term rehab (<3 hours/day) ?  ?  ?Assistance Recommended at Discharge Frequent or constant Supervision/Assistance  ?Patient can return home with the following A lot of help with walking and/or transfers;Help with stairs or ramp for entrance;A lot of help with bathing/dressing/bathroom;Assistance with cooking/housework ?  ?Equipment Recommendations ? None recommended by PT  ?  ?Recommendations for Other Services   ? ? ?  ?Precautions / Restrictions Precautions ?Precautions: Fall;Other (comment) ?Precaution Comments: Impulsive ?Restrictions ?Weight Bearing Restrictions: No  ?  ? ?Mobility ? Bed Mobility ?Overal bed mobility: Needs  Assistance ?Bed Mobility: Supine to Sit ?  ?  ?Supine to sit: Min guard ?  ?  ?General bed mobility comments: minG for safety, pt using bed rails ?  ? ?Transfers ?Overall transfer level: Needs assistance ?Equipment used: Rolling walker (2 wheels) ?Transfers: Sit to/from Stand, Bed to chair/wheelchair/BSC ?Sit to Stand: Min assist, +2 safety/equipment, Mod assist ?  ?Step pivot transfers: +2 safety/equipment, Min guard, Mod assist ?  ?  ?  ?General transfer comment: modA initially, pt impulsively stepping away from RW to "move his legs around" after being prompted to stand closer to the RW for safety and move his legs within his BOS, pt able to complete simple transfers with minG for safety and line management. ?  ? ?Ambulation/Gait ?Ambulation/Gait assistance: Mod assist, Min guard ?Gait Distance (Feet): 5 Feet (+ 50ft) ?Assistive device: Rolling walker (2 wheels) ?Gait Pattern/deviations: Step-to pattern, Decreased stride length, Trunk flexed ?Gait velocity: decreased ?Gait velocity interpretation: <1.31 ft/sec, indicative of household ambulator ?  ?General Gait Details: pt taking forward steps away from EOB, initially modA when pt stepping far outside his BOS to "move his legs around" but minG once returned to good posture with BUE support on RW. slow and generally steady ? ? ? ? ?  ?Balance Overall balance assessment: Needs assistance ?Sitting-balance support: No upper extremity supported, Feet supported ?Sitting balance-Leahy Scale: Good ?  ?  ?Standing balance support: Bilateral upper extremity supported, During functional activity ?Standing balance-Leahy Scale: Poor ?Standing balance comment: relies on BUE and external support ?  ?  ?  ?  ?  ?  ?  ?  ?  ?  ?  ?  ? ?  ?Cognition Arousal/Alertness: Awake/alert ?Behavior During Therapy: Impulsive ?Overall Cognitive Status:  Impaired/Different from baseline ?Area of Impairment: Attention, Following commands, Safety/judgement, Awareness, Problem solving ?  ?  ?  ?  ?   ?  ?  ?  ?  ?Current Attention Level: Sustained ?  ?Following Commands: Follows multi-step commands with increased time ?Safety/Judgement: Decreased awareness of safety, Decreased awareness of deficits ?Awareness: Emergent ?Problem Solving: Slow processing, Requires verbal cues, Difficulty sequencing ?General Comments: pt able to follow cues and commands without assist, but did need cues for safety. Demos good insight to current deficits at times, but only after being prompted ?  ?  ? ?  ?Exercises   ? ?  ?General Comments General comments (skin integrity, edema, etc.): SpO2 mostly stable in 90s on 4L with activity. brief low of 86% with rapid return to 92% after exertion of putting on socks ?  ?  ? ?Pertinent Vitals/Pain Pain Assessment ?Pain Assessment: No/denies pain  ? ? ? ?PT Goals (current goals can now be found in the care plan section) Acute Rehab PT Goals ?Patient Stated Goal: go home ?PT Goal Formulation: With patient ?Time For Goal Achievement: 09/01/21 ?Potential to Achieve Goals: Good ?Progress towards PT goals: Progressing toward goals ? ?  ?Frequency ? ? ? Min 3X/week ? ? ? ?  ?PT Plan Current plan remains appropriate  ? ? ?Co-evaluation PT/OT/SLP Co-Evaluation/Treatment: Yes ?Reason for Co-Treatment: Necessary to address cognition/behavior during functional activity;For patient/therapist safety;To address functional/ADL transfers ?PT goals addressed during session: Mobility/safety with mobility;Balance;Strengthening/ROM ?  ?  ? ?  ?AM-PAC PT "6 Clicks" Mobility   ?Outcome Measure ? Help needed turning from your back to your side while in a flat bed without using bedrails?: A Little ?Help needed moving from lying on your back to sitting on the side of a flat bed without using bedrails?: A Little ?Help needed moving to and from a bed to a chair (including a wheelchair)?: A Lot ?Help needed standing up from a chair using your arms (e.g., wheelchair or bedside chair)?: A Lot ?Help needed to walk in  hospital room?: A Lot ?Help needed climbing 3-5 steps with a railing? : A Lot ?6 Click Score: 14 ? ?  ?End of Session Equipment Utilized During Treatment: Oxygen;Gait belt ?Activity Tolerance: Patient limited by fatigue ?Patient left: with call bell/phone within reach;in chair;with chair alarm set ?Nurse Communication: Mobility status ?PT Visit Diagnosis: Unsteadiness on feet (R26.81);Other abnormalities of gait and mobility (R26.89);Muscle weakness (generalized) (M62.81) ?  ? ? ?Time: 1779-3903 ?PT Time Calculation (min) (ACUTE ONLY): 32 min ? ?Charges:  $Therapeutic Exercise: 8-22 mins          ?          ? ?Vickki Muff, PT, DPT  ? ?Acute Rehabilitation Department ?Pager #: [phone] - [phone] ? ? ?Ronnie Derby ?08/21/2021, 10:45 AM ? ?

## 2021-08-21 NOTE — Discharge Summary (Signed)
? ?Physician Discharge Summary ?  ?Patient: Zachary Hawkins MRN: [id number] DOB: 01-16-1956  ?Admit date:     08/14/2021  ?Discharge date: 08/21/21  ?Discharge Physician: Rhetta Mura  ? ?PCP: Sebastian Ache, PA-C  ? ?Recommendations at discharge:  ? ?Recommend Chem-12 CBC magnesium in 1 week at PCP office ?Needs to weigh on the same scale at home-PCP to ensure that a record of this is brought in to them as possible ?New medications this admission = Jardiance/Farxiga or substitute, Cardura and no dosage changes of torsemide to once daily in the morning-see [facility] for further recommendations ?Please refer patient back to wherever he had his [place] Pap titrated-given his size and weight loss he may benefit from slightly lower settings? ?He cannot be on complete goal-directed medical therapy for heart failure given his angioedema in the past ACE inhibitor-would monitor his echo probably in the next 3 to 6 months or as per cardiology--- there is a component of cor pulmonale given his enlarged size and he may benefit eventually from advanced heart failure team assessment ? ? ?Discharge Diagnoses: ?Principal Problem: ?  Acute hypoxemic respiratory failure (HCC) ? ?66 year-old home dwelling.male ?BMI 43 (prior baseline weight 290 pounds) chronic hypoxic respiratory failure at baseline 4 L oxygen ?HFpEF ?PAH as well as obstructive sleep apnea very intermittently compliant on CPAP at home ?Permanent A-fib ?CKD 3 AA ??  ACE versus black cherry angioedema ?  ?Developed SOB 4/7 sats in the 50s on room air patient altered-found to have pain medication bottle next time which was almost empty ?Initial CO2 >123 ?Rx DuoNeb 125 6 mg Solu-Medrol epi mag ?Admitted to ICU started on mechanical ventilation per their protocol ?Extubated 4/11 and diuresed with IV Lasix and eventually transitioned to torsemide daily ?His dry weight is around 350 pounds (at last office visit with Mr. Molli Hazard) and on discharge he was 325 pounds  which should be annotated as his new dry weight ? ?As he still works part-time and transports people-he declined going to skilled facility and elected to go home-he will need outpatient physical therapy to help him maximize his potential ?  ?Hospital-Problem based course ?  ?Acute type II respiratory failure on admission 2/2 noncompliance with CPAP, OSA and unintentional pain medication overdose ?Continue efforts at BiPAP at night--I have counseled him strongly about compliance ?Continue Pulmicort 0.25 twice daily DuoNeb every 2 as needed, DuoNeb 3 times daily in addition ??  Underlying COPD-chronic respiratory failure at home 4 L nasal oxygen ?No history of smoking rapidly taper off of steroids-he does not have an underlying history of COPD but nonetheless probably should have some element of spirometry and titration of his CPAP in the outpatient setting by whoever has prescribed this for him in the past ?HFpEF + PAH ?Aim to keep patient dry continue torsemide 80 daily ?I/o= -3.8 L, weight 155-->148 kg on discharge ?Continue Marcelline Deist as GDMT ?Prior to admission was also on Aldactone 25 ?The patient has a history of angioedema to ACE versus black cherry in the past would hesitate to add anything containing ACE inhibitors to his regimen ?Eventually may benefit from right heart cath however at this time he is stabilized and is quite a ways below his prior dry weight and is at 325 pounds which should be annotated versus new dry weight ?ACE inhibitor is contraindicated in his case as above ?Permanent A-fib CHADS2 score >4 on Eliquis ?Continue bisoprolol 5 daily for rate control ?HTN ?Relatively well controlled continue regimen including amlodipine 5,  bisoprolol as above, Cardura 2 mg daily, can use as needed hydralazine IV if needed ?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 ? ? ?  ? ? ?Consultants:  ?Procedures performed:   ?Disposition: Home health ?Diet recommendation:  ?Discharge Diet Orders (From admission, onward)  ? ?  Start     Ordered  ? 08/21/21 0000  Diet - low sodium heart healthy       ? 08/21/21 1440  ? ?  ?  ? ?  ? ?Cardiac and Carb modified diet ?DISCHARGE MEDICATION: ?Allergies as of 08/21/2021   ? ?   Reactions  ? Ace Inhibitors Anaphylaxis  ? Black Cherry Fruit Extract Valentino Saxon[cherry Extract] Anaphylaxis  ? Tongue  ? [place] Flavor [flavoring Agent] Anaphylaxis  ? ?  ? ?  ?Medication List  ?  ? ?STOP taking these medications   ? ?potassium chloride SA 20 MEQ tablet ?Commonly known as: KLOR-CON M ?  ? ?  ? ?TAKE these medications   ? ?Advair HFA 115-21 MCG/ACT inhaler ?Generic drug: fluticasone-salmeterol ?INHALE 2 PUFFS INTO THE LUNGS 2 (TWO) TIMES DAILY. ?  ?albuterol 108 (90 Base) MCG/ACT inhaler ?Commonly known as: VENTOLIN HFA ?Inhale 2 puffs into the lungs every 4 (four) hours as needed for wheezing or shortness of breath. ?  ?amLODipine 5 MG tablet ?Commonly known as: NORVASC ?Take 1 tablet (5 mg total) by mouth daily. ?  ?apixaban 5 MG Tabs tablet ?Commonly known as: ELIQUIS ?Take 1 tablet (5 mg total) by mouth 2 (two) times daily. ?  ?atorvastatin 10 MG tablet ?Commonly known as: LIPITOR ?Take 10 mg by mouth daily. ?  ?bisoprolol 5 MG tablet ?Commonly known as: ZEBETA ?Take 1 tablet (5 mg total) by mouth daily. ?  ?budesonide 0.25 MG/2ML nebulizer solution ?Commonly known as: PULMICORT ?Take 2 mLs (0.25 mg total) by nebulization 2 (two) times daily. ?  ?doxazosin 2 MG tablet ?Commonly known as: CARDURA ?Take 1 tablet (2 mg total) by mouth daily. ?Start taking on: August 22, 2021 ?  ?EPINEPHrine 0.3 mg/0.3 mL Soaj injection ?Commonly known as: EPI-PEN ?Inject 0.3 mg into the muscle as needed for anaphylaxis. ?What changed: when to take this ?  ?famotidine 20 MG tablet ?Commonly known as: PEPCID ?Take 1 tablet (20 mg total) by mouth  2 (two) times daily. ?  ?Farxiga 10 MG Tabs tablet ?Generic drug: dapagliflozin propanediol ?Take 1 tablet (10 mg total) by mouth daily. ?  ?fluticasone 50 MCG/ACT nasal spray ?Commonly known as: FLONASE ?Place 1 spray into both nostrils daily. ?  ?multivitamin with minerals Tabs tablet ?Take 1 tablet by mouth daily. ?  ?pantoprazole 40 MG tablet ?Commonly known as: PROTONIX ?Take 1 tablet (40 mg total) by mouth daily. ?Start taking on: August 22, 2021 ?  ?saline Gel ?Place 1 application into both nostrils at bedtime. ?  ?spironolactone 25 MG tablet ?Commonly known as: ALDACTONE ?Take 1 tablet (25 mg total) by mouth daily. ?  ?tamsulosin 0.4 MG Caps capsule ?Commonly known as: FLOMAX ?Take 1 capsule (0.4 mg total) by mouth daily after supper. ?  ?Torsemide 40 MG Tabs ?Take 80 mg by mouth daily. ?Start taking on: August 22, 2021 ?What changed:  ?medication strength ?how much to take ?when to take this ?additional instructions ?  ? ?  ? ?  ?  ? ? ?  ?  Durable Medical Equipment  ?(From admission, onward)  ?  ? ? ?  ? ?  Start     Ordered  ? 08/21/21 1442  For home use only DME oxygen  Once       ?Question Answer Comment  ?Length of Need Lifetime   ?Mode or (Route) Nasal cannula   ?Liters per Minute 3   ?Oxygen delivery system Gas   ?  ? 08/21/21 1441  ? ?  ?  ? ?  ? ? ?Discharge Exam: ?Filed Weights  ? 08/19/21 0402 08/20/21 0400 08/21/21 1610  ?Weight: (!) 155.5 kg (!) 151.2 kg (!) 148.1 kg  ? ?Pleasant awake alert jovial no distress ?EOMI NCAT no focal deficit thick neck Mallampati 4 ?Chest is clear no rales no rhonchi no wheeze ?ROM is intact ?I cannot appreciate JVD or hepatosplenomegaly because of his massive habitus ?He does have some lower extremity edema ?He has no rales or rhonchi ?He is ambulating and moving around fairly well and walked 250 feet today with mobility tech ? ?Condition at discharge: fair ? ?The results of significant diagnostics from this hospitalization (including imaging, microbiology, ancillary  and laboratory) are listed below for reference.  ? ?Imaging Studies: ?CT HEAD WO CONTRAST ( ) ? ?Result Date: 08/14/2021 ?CLINICAL DATA:  Altered mental status. EXAM: CT HEAD WITHOUT CONTRAST TECHNIQUE: [employer]

## 2021-08-21 NOTE — Progress Notes (Signed)
Occupational Therapy Treatment ?Patient Details ?Name: Zachary Hawkins ?MRN: [id number] ?DOB: 1955-08-03 ?Today's Date: 08/21/2021 ? ? ?History of present illness Pt adm 4/7 with COPD exacerbation and respiratory failure. Intubated 4/7 and extubated 4/11.  PMH - COPD with chronic hypoxemic respiratory failure, diastolic heart failure, htn, paroxysmal fibrillation, ckd, morbid obesity ?  ?OT comments ? Pt. Seen with PT for skilled therapy session.  Pt. Able to complete bed mobility, short distance ambulation to bsc for toileting task and back to chair.  Still requiring 2 person assistance for all pursuits.   Requires cues for safety and sequencing.  Aware of poor endurance and did request rest breaks appropriately.  Agree with current d/c recommendations.    ? ?Recommendations for follow up therapy are one component of a multi-disciplinary discharge planning process, led by the attending physician.  Recommendations may be updated based on patient status, additional functional criteria and insurance authorization. ?   ?Follow Up Recommendations ? Skilled nursing-short term rehab (<3 hours/day)  ?  ?Assistance Recommended at Discharge Frequent or constant Supervision/Assistance  ?Patient can return home with the following ? Two people to help with walking and/or transfers;Two people to help with bathing/dressing/bathroom;Direct supervision/assist for medications management;Direct supervision/assist for financial management;Assist for transportation;Help with stairs or ramp for entrance;Assistance with cooking/housework ?  ?Equipment Recommendations ? BSC/3in1  ?  ?Recommendations for Other Services   ? ?  ?Precautions / Restrictions Precautions ?Precautions: Fall;Other (comment) ?Precaution Comments: Impulsive ?Restrictions ?Weight Bearing Restrictions: No  ? ? ?  ? ?Mobility Bed Mobility ?Overal bed mobility: Needs Assistance ?Bed Mobility: Supine to Sit ?  ?  ?Supine to sit: Min guard ?  ?  ?General bed mobility  comments: minG for safety, pt using bed rails ?  ? ?Transfers ?Overall transfer level: Needs assistance ?Equipment used: Rolling walker (2 wheels) ?Transfers: Sit to/from Stand, Bed to chair/wheelchair/BSC ?Sit to Stand: Min assist, +2 safety/equipment, Mod assist ?  ?  ?Step pivot transfers: +2 safety/equipment, Min guard, Mod assist ?  ?  ?General transfer comment: modA initially, pt impulsively stepping away from RW to "move his legs around" after being prompted to stand closer to the RW for safety and move his legs within his BOS, pt able to complete simple transfers with minG for safety and line management. ?  ?  ?Balance   ?  ?  ?  ?  ?  ?  ?  ?  ?  ?  ?  ?  ?  ?  ?  ?  ?  ?  ?   ? ?ADL either performed or assessed with clinical judgement  ? ?ADL Overall ADL's : Needs assistance/impaired ?  ?  ?Grooming: Set up;Sitting;Wash/dry face ?  ?  ?  ?  ?  ?  ?  ?Lower Body Dressing: Set up;Sitting/lateral leans;Moderate assistance ?Lower Body Dressing Details (indicate cue type and reason): able to don L  sock without assistance, able to place sock over R toes but required assistance to pull the rest of the way over his foot ?Toilet Transfer: Minimal assistance;Moderate assistance;+2 for physical assistance;+2 for safety/equipment;Cueing for safety;Cueing for sequencing;Ambulation;Rolling walker (2 wheels);BSC/3in1 ?Toilet Transfer Details (indicate cue type and reason): cues for safety, sequencing and hand placement on arm rests prior to sitting down and before standing up ?Toileting- Clothing Manipulation and Hygiene: Set up;Sit to/from stand;Maximal assistance ?Toileting - Clothing Manipulation Details (indicate cue type and reason): able to wipe buttocks in standing x1 but required additional assistance for thoroughness ?  ?  ?  Functional mobility during ADLs: Minimal assistance;Moderate assistance;+2 for safety/equipment;+2 for physical assistance;Rolling walker (2 wheels) ?  ?  ? ?Extremity/Trunk Assessment   ?   ?  ?  ?  ?  ? ?Vision   ?  ?  ?Perception   ?  ?Praxis   ?  ? ?Cognition Arousal/Alertness: Awake/alert ?Behavior During Therapy: Impulsive ?Overall Cognitive Status: Impaired/Different from baseline ?Area of Impairment: Attention, Following commands, Safety/judgement, Awareness, Problem solving ?  ?  ?  ?  ?  ?  ?  ?  ?  ?Current Attention Level: Sustained ?Memory: Decreased short-term memory, Decreased recall of precautions ?Following Commands: Follows multi-step commands with increased time ?Safety/Judgement: Decreased awareness of safety, Decreased awareness of deficits ?Awareness: Emergent ?Problem Solving: Slow processing, Requires verbal cues, Difficulty sequencing ?General Comments: pt able to follow cues and commands without assist, but did need cues for safety. Demos good insight to current deficits at times, but only after being prompted ?  ?  ?   ?Exercises   ? ?  ?Shoulder Instructions   ? ? ?  ?General Comments SpO2 mostly stable in 90s on 4L with activity. brief low of 86% with rapid return to 92% after exertion of putting on socks  ? ? ?Pertinent Vitals/ Pain       Pain Assessment ?Pain Assessment: No/denies pain ? ?Home Living   ?  ?  ?  ?  ?  ?  ?  ?  ?  ?  ?  ?  ?  ?  ?  ?  ?  ?  ? ?  ?Prior Functioning/Environment    ?  ?  ?  ?   ? ?Frequency ? Min 2X/week  ? ? ? ? ?  ?Progress Toward Goals ? ?OT Goals(current goals can now be found in the care plan section) ? Progress towards OT goals: Progressing toward goals ? ?   ?Plan Discharge plan remains appropriate   ? ?Co-evaluation ? ? ?   ?Reason for Co-Treatment: For patient/therapist safety;To address functional/ADL transfers ?PT goals addressed during session: Mobility/safety with mobility;Balance;Strengthening/ROM ?  ?  ? ?  ?AM-PAC OT "6 Clicks" Daily Activity     ?Outcome Measure ? ? Help from another person eating meals?: A Little ?Help from another person taking care of personal grooming?: A Little ?Help from another person toileting, which  includes using toliet, bedpan, or urinal?: Total ?Help from another person bathing (including washing, rinsing, drying)?: A Lot ?Help from another person to put on and taking off regular upper body clothing?: A Little ?Help from another person to put on and taking off regular lower body clothing?: A Lot ?6 Click Score: 14 ? ?  ?End of Session Equipment Utilized During Treatment: Rolling walker (2 wheels);Gait belt;Oxygen ? ?OT Visit Diagnosis: Other abnormalities of gait and mobility (R26.89);Muscle weakness (generalized) (M62.81);Other symptoms and signs involving cognitive function ?  ?Activity Tolerance Patient tolerated treatment well ?  ?Patient Left in chair;with call bell/phone within reach;with chair alarm set ?  ?Nurse Communication   ?  ? ?   ? ?Time: [id number] ?OT Time Calculation (min): 32 min ? ?Charges: OT General Charges ?$OT Visit: 1 Visit ?OT Treatments ?$Self Care/Home Management : 8-22 mins ? ?Sonia Baller, COTA/L ?Acute Rehabilitation ?[phone]  ? ?Tanya Nones ?08/21/2021, 11:52 AM ?

## 2021-08-24 LAB — POCT I-STAT 7, (LYTES, BLD GAS, ICA,H+H)
Acid-Base Excess: 13 mmol/L — ABNORMAL HIGH (ref 0.0–2.0)
Bicarbonate: 40.6 mmol/L — ABNORMAL HIGH (ref 20.0–28.0)
Calcium, Ion: 1.17 mmol/L (ref 1.15–1.40)
HCT: 44 % (ref 39.0–52.0)
Hemoglobin: 15 g/dL (ref 13.0–17.0)
O2 Saturation: 99 %
Patient temperature: 98.2
Potassium: 4.4 mmol/L (ref 3.5–5.1)
Sodium: 135 mmol/L (ref 135–145)
TCO2: 43 mmol/L — ABNORMAL HIGH (ref 22–32)
pCO2 arterial: 63.1 mmHg — ABNORMAL HIGH (ref 32–48)
pH, Arterial: 7.416 (ref 7.35–7.45)
pO2, Arterial: 129 mmHg — ABNORMAL HIGH (ref 83–108)

## 2021-08-31 NOTE — Therapy (Signed)
?OUTPATIENT PHYSICAL THERAPY LOWER EXTREMITY EVALUATION ? ? ?Patient Name: Zachary Hawkins ?MRN: [id number] ?DOB:Aug 10, 1955, 66 y.o., male ?Today's Date: 09/02/2021 ? ? PT End of Session - 09/01/21 1433   ? ? Visit Number 1   ? Number of Visits 17   ? Date for PT Re-Evaluation 10/27/21   ? Authorization Type [employer]   ? PT Start Time 1355   ? PT Stop Time 1430   ? PT Time Calculation (min) 35 min   ? Equipment Utilized During Treatment Oxygen   ? Activity Tolerance Patient limited by fatigue   ? Behavior During Therapy [facility] for tasks assessed/performed   ? ?  ?  ? ?  ? ? ?Past Medical History:  ?Diagnosis Date  ? Asthma   ? BPH (benign prostatic hyperplasia)   ? Cardiomyopathy ([place]) 06/2017  ? EF 40-45%  ? CHF (congestive heart failure) ([place])   ? Chronic renal insufficiency, stage 3 (moderate) (HCC)   ? COPD (chronic obstructive pulmonary disease) ([place])   ? Diastolic dysfunction [id number]  ? grade 2   ? Hyperlipidemia   ? Hypertension   ? Hypertensive urgency   ? Obesity   ? Obesity   ? OSA (obstructive sleep apnea)   ? Paroxysmal atrial fibrillation ([place]) 2022  ? on Eliquis  ? ?History reviewed. No pertinent surgical history. ?Patient Active Problem List  ? Diagnosis Date Noted  ? Acute hypoxemic respiratory failure ([place]) 08/14/2021  ? Acute encephalopathy   ? Chronic diastolic heart failure ([place])   ? Chronic obstructive pulmonary disease with acute exacerbation ([place])   ? Hyperkalemia   ? Class 3 obesity ([place]) 07/18/2021  ? Acute on chronic diastolic heart failure ([place]) 07/17/2021  ? BPH (benign prostatic hyperplasia)   ? Hyperlipidemia   ? Acute on chronic respiratory failure with hypoxia and hypercapnia ([place]) 06/23/2021  ? Chronic diastolic CHF (congestive heart failure) ([place]) 06/23/2021  ? COPD with acute exacerbation ([place]) 06/23/2021  ? Acute on chronic diastolic (congestive) heart failure ([place]) 04/30/2021  ? AKI (acute kidney injury) ([place]) 04/29/2021  ? Acute exacerbation of CHF (congestive heart failure)  ([place]) 04/29/2021  ? Chronic respiratory failure with hypercapnia ([place]) 04/29/2021  ? Bilateral hydrocele 04/29/2021  ? Dyspnea on exertion 04/29/2021  ? Acute respiratory failure with hypoxia ([place]) 03/10/2021  ? Urticaria 01/15/2021  ? Chronic heart failure with preserved ejection fraction ([place])   ? Angioedema 01/04/2021  ? Influenza vaccine refused 06/26/2020  ? COVID-19 vaccine series completed 06/26/2020  ? Paroxysmal atrial fibrillation ([place]) 07/02/2019  ? Secondary hypercoagulable state ([place]) 07/02/2019  ? OSA (obstructive sleep apnea) 04/03/2019  ? History of angioedema due to ACE INHIBITORS 10/13/2017  ? Non compliance w medication regimen 07/13/2017  ? Essential hypertension 06/23/2017  ? Acute kidney injury superimposed on chronic kidney disease ([place])   ? Cardiomyopathy ([place]) 06/06/2017  ? Dyspnea 06/05/2017  ? ? ?PCP: Dorise Hiss, PA-C ? ?REFERRING PROVIDER: Nita Sells, MD ? ?REFERRING DIAG:  ?Unsteadiness on feet (R26.81);Other abnormalities of gait and mobility (R26.89);Muscle weakness (generalized) (M62.81) ? ?THERAPY DIAG:  ?Muscle weakness (generalized) ? ?Difficulty in walking, not elsewhere classified ? ?Other abnormalities of gait and mobility ? ?Unsteadiness on feet ? ?ONSET DATE:  ?COPD exacerbation 08/14/2021; Intubated 4/7 and extubated 4/11 ? ?SUBJECTIVE:  ? ?SUBJECTIVE STATEMENT: ?Pt presents to PT with s/p recent hospital stay for COPD exacerbation and difficulty breathing. He was extubated on 08/18/2021 and has had significant physical deconditioning over the last few weeks. Pt  notes that he has had a lot of weakness in LE as well as instability, especially with longer distance ambulation. Pt has supplemental O2 and frequently checks his SpO2 with pulse oximeter at home. He notes that it frequently drops below 90.  ? ?PERTINENT HISTORY: ?COPD with chronic hypoxemic respiratory failure, diastolic heart failure, HTN, paroxysmal fibrillation, CKD, morbid obesity ? ?PAIN:   ?Are you having pain?  ?No: NPRS scale: 0/10 ?Pain location: N/A ? ?PRECAUTIONS: Fall ? ?WEIGHT BEARING RESTRICTIONS No ? ?FALLS:  ?Has patient fallen in last 6 months? Yes. Number of falls 4  ? ?LIVING ENVIRONMENT: ?Lives with: lives alone ?Lives in: House/apartment ?Stairs: Yes: External: 3 steps; none ?Has following equipment at home: Gilford Rile - 2 wheeled and Grab bars; supplemental O2 (4-6L) ? ?OCCUPATION: not currently working ? ?PLOF: Independent and Independent with basic ADLs ? ?PATIENT GOALS: improve endurance, strength, and walking in order to improve functional ability and independence ? ? ?OBJECTIVE:  ? ?VITALS: ? SpO2: 88-98% on 3L  ? ?DIAGNOSTIC FINDINGS:  ? N/A ? ?PATIENT SURVEYS:  ?N/A ? ?COGNITION: ? Overall cognitive status: Within functional limits for tasks assessed   ?  ?SENSATION: ?WFL ? ?POSTURE:  ?Large body habitus; on supplemental O2, fwd head ? ?PALPATION: ?N/A ? ?LE MMT: ? ?MMT Right ?09/02/2021 Left ?09/02/2021  ?Hip flexion  3+/5 3+/5  ?Hip extension    ?Hip abduction 3+/5 3+/5  ?Hip adduction 3+/5 3+/5  ?Hip external rotation    ?Hip internal rotation    ?Knee extension 4/5 4/5  ?Knee flexion 4/5 4/5  ?Ankle dorsiflexion     ?Ankle plantarflexion    ?Ankle inversion    ?Ankle eversion    ?Grossly    ?(Blank rows = not tested) ? ?FUNCTIONAL TESTS:  ?30 Second Sit to Stand: 4 reps ?2MWT: 269ft ? ?GAIT: ?Distance walked: 21ft ?Assistive device utilized: Single point cane and supplemental O2 ?Level of assistance: CGA ?Comments: very dyspneic with longer distance, decreased gait speed  ? ?TODAY'S TREATMENT: ?[facility] Adult PT Treatment:                                                DATE: 09/01/2021 ?Therapeutic Exercise: ?STS x 5 ?Standing hip abd x 10 each - with UE support ?LAQ x 5 each ?Seated march x 20 ? ?PATIENT EDUCATION:  ?Education details: eval findings, HEP, POC ?Person educated: Patient ?Education method: Explanation, Demonstration, and Handouts ?Education comprehension: verbalized  understanding and returned demonstration ? ? ?HOME EXERCISE PROGRAM: ?Access Code: [email or web address] ?URL: [email or web address] ?Date: 09/01/2021 ?Prepared by: Octavio Manns ? ?Exercises ?- Sit to Stand  - 2-3 x daily - 7 x weekly - 2 sets - 10 reps ?- Standing Hip Abduction with Counter Support  - 2-3 x daily - 7 x weekly - 2 sets - 10 reps ?- Seated Long Arc Quad  - 2-3 x daily - 7 x weekly - 2 sets - 10 reps - 3" hold hold ?- Seated March  - 2-3 x daily - 7 x weekly - 2 sets - 20 reps ? ?ASSESSMENT: ? ?CLINICAL IMPRESSION: ?Patient is a 66 y.o. M who was seen today for physical therapy evaluation and treatment for weakness and gait instability follow hospital stay for COPD exacerbation. Physical findings are consistent with referring provider impression and hospital course, as pt demonstrates functional hip weakness and below normal values  on physical performance measures of 30 Second Sit to Stand and 2MWT. He is operating below baseline PLOF and would benefit from skilled PT services working on improve strength and functional activity tolerance.   ? ? ?OBJECTIVE IMPAIRMENTS decreased activity tolerance, decreased balance, decreased endurance, decreased mobility, difficulty walking, decreased ROM, decreased strength, and pain.  ? ?ACTIVITY LIMITATIONS cleaning, community activity, driving, laundry, and yard work.  ? ?PERSONAL FACTORS Fitness, Time since onset of injury/illness/exacerbation, and 3+ comorbidities: COPD with chronic hypoxemic respiratory failure, diastolic heart failure, HTN, paroxysmal fibrillation, CKD, morbid obesity  are also affecting patient's functional outcome.  ? ? ?REHAB POTENTIAL: Good ? ?CLINICAL DECISION MAKING: Evolving/moderate complexity ? ?EVALUATION COMPLEXITY: Moderate ? ? ?GOALS: ?Goals reviewed with patient? No ? ?SHORT TERM GOALS: Target date: 09/23/2021 ? ?Pt will be compliant and knowledgeable with initial HEP for improved comfort and carryover ?Baseline: initial HEP  given  ?Goal status: INITIAL ? ?2.  Pt will be able to stand >69min for improved activity tolerance and ADL performance ?Baseline: 1-2 min ?Goal status: INITIAL ? ?LONG TERM GOALS: Target date: 10/27/2021 ? ?Pt

## 2021-09-01 ENCOUNTER — Ambulatory Visit: Payer: Medicaid Other | Attending: Family Medicine

## 2021-09-01 DIAGNOSIS — R2689 Other abnormalities of gait and mobility: Secondary | ICD-10-CM | POA: Insufficient documentation

## 2021-09-01 DIAGNOSIS — R2681 Unsteadiness on feet: Secondary | ICD-10-CM | POA: Diagnosis present

## 2021-09-01 DIAGNOSIS — R262 Difficulty in walking, not elsewhere classified: Secondary | ICD-10-CM | POA: Diagnosis present

## 2021-09-01 DIAGNOSIS — M6281 Muscle weakness (generalized): Secondary | ICD-10-CM | POA: Insufficient documentation

## 2021-09-08 ENCOUNTER — Ambulatory Visit: Payer: Medicaid Other

## 2021-09-10 ENCOUNTER — Ambulatory Visit: Payer: Medicaid Other | Attending: Family Medicine

## 2021-09-10 DIAGNOSIS — R262 Difficulty in walking, not elsewhere classified: Secondary | ICD-10-CM | POA: Diagnosis present

## 2021-09-10 DIAGNOSIS — R2689 Other abnormalities of gait and mobility: Secondary | ICD-10-CM | POA: Diagnosis present

## 2021-09-10 DIAGNOSIS — R2681 Unsteadiness on feet: Secondary | ICD-10-CM | POA: Insufficient documentation

## 2021-09-10 DIAGNOSIS — M6281 Muscle weakness (generalized): Secondary | ICD-10-CM | POA: Diagnosis present

## 2021-09-10 NOTE — Therapy (Signed)
?OUTPATIENT PHYSICAL THERAPY TREATMENT NOTE ? ? ?Patient Name: Zachary Hawkins ?MRN: [id number] ?DOB:02-Sep-1955, 66 y.o., male ?Today's Date: 09/10/2021 ? ?PCP: Sebastian Ache, PA-C ?REFERRING PROVIDER: Rhetta Mura, MD ? ?END OF SESSION:  ? PT End of Session - 09/10/21 1347   ? ? Visit Number 2   ? Number of Visits 17   ? Date for PT Re-Evaluation 10/27/21   ? Authorization Type [employer]   ? PT Start Time 1400   ? PT Stop Time 1440   ? PT Time Calculation (min) 40 min   ? Equipment Utilized During Treatment Oxygen   ? Activity Tolerance Patient limited by fatigue   ? Behavior During Therapy [facility] for tasks assessed/performed   ? ?  ?  ? ?  ? ? ?Past Medical History:  ?Diagnosis Date  ? Asthma   ? BPH (benign prostatic hyperplasia)   ? Cardiomyopathy (HCC) 06/2017  ? EF 40-45%  ? CHF (congestive heart failure) (HCC)   ? Chronic renal insufficiency, stage 3 (moderate) (HCC)   ? COPD (chronic obstructive pulmonary disease) (HCC)   ? Diastolic dysfunction 06/2017  ? grade 2   ? Hyperlipidemia   ? Hypertension   ? Hypertensive urgency   ? Obesity   ? Obesity   ? OSA (obstructive sleep apnea)   ? Paroxysmal atrial fibrillation (HCC) 2022  ? on Eliquis  ? ?History reviewed. No pertinent surgical history. ?Patient Active Problem List  ? Diagnosis Date Noted  ? Acute hypoxemic respiratory failure (HCC) 08/14/2021  ? Acute encephalopathy   ? Chronic diastolic heart failure (HCC)   ? Chronic obstructive pulmonary disease with acute exacerbation (HCC)   ? Hyperkalemia   ? Class 3 obesity (HCC) 07/18/2021  ? Acute on chronic diastolic heart failure (HCC) 07/17/2021  ? BPH (benign prostatic hyperplasia)   ? Hyperlipidemia   ? Acute on chronic respiratory failure with hypoxia and hypercapnia (HCC) 06/23/2021  ? Chronic diastolic CHF (congestive heart failure) (HCC) 06/23/2021  ? COPD with acute exacerbation (HCC) 06/23/2021  ? Acute on chronic diastolic (congestive) heart failure (HCC) 04/30/2021  ? AKI  (acute kidney injury) (HCC) 04/29/2021  ? Acute exacerbation of CHF (congestive heart failure) (HCC) 04/29/2021  ? Chronic respiratory failure with hypercapnia (HCC) 04/29/2021  ? Bilateral hydrocele 04/29/2021  ? Dyspnea on exertion 04/29/2021  ? Acute respiratory failure with hypoxia (HCC) 03/10/2021  ? Urticaria 01/15/2021  ? Chronic heart failure with preserved ejection fraction (HCC)   ? Angioedema 01/04/2021  ? Influenza vaccine refused 06/26/2020  ? COVID-19 vaccine series completed 06/26/2020  ? Paroxysmal atrial fibrillation (HCC) 07/02/2019  ? Secondary hypercoagulable state (HCC) 07/02/2019  ? OSA (obstructive sleep apnea) 04/03/2019  ? History of angioedema due to ACE INHIBITORS 10/13/2017  ? Non compliance w medication regimen 07/13/2017  ? Essential hypertension 06/23/2017  ? Acute kidney injury superimposed on chronic kidney disease (HCC)   ? Cardiomyopathy (HCC) 06/06/2017  ? Dyspnea 06/05/2017  ? ? ?REFERRING DIAG: Unsteadiness on feet (R26.81);Other abnormalities of gait and mobility (R26.89);Muscle weakness (generalized) (M62.81) ? ?THERAPY DIAG: Muscle weakness (generalized) ?  ?Difficulty in walking, not elsewhere classified ?  ?Other abnormalities of gait and mobility ?  ?Unsteadiness on feet ? ? ?PERTINENT HISTORY: COPD with chronic hypoxemic respiratory failure, diastolic heart failure, HTN, paroxysmal fibrillation, CKD, morbid obesity ? ?PRECAUTIONS: Fall, respiratory ? ?SUBJECTIVE: Describes a little low back pain today but otherwise doing OK.  Tires easily ? ?PAIN:  ?Are you having pain? Yes:  NPRS scale: 4/10 ?Pain location: low back ?Pain description: ache  ?Aggravating factors: activity ?Relieving factors: movement  ? ? ?OBJECTIVE: (objective measures completed at initial evaluation unless otherwise dated) ? ? ?OBJECTIVE:  ?  ?VITALS: ?          SpO2: 94% on 3L  ? BP L UE sitting 117/89 ?  ?DIAGNOSTIC FINDINGS:  ?          N/A ?  ?PATIENT SURVEYS:  ?N/A ?  ?COGNITION: ?          Overall  cognitive status: Within functional limits for tasks assessed               ?           ?SENSATION: ?WFL ?  ?POSTURE:  ?Large body habitus; on supplemental O2, fwd head ?  ?PALPATION: ?N/A ?  ?LE MMT: ?  ?MMT Right ?09/02/2021 Left ?09/02/2021  ?Hip flexion  3+/5 3+/5  ?Hip extension      ?Hip abduction 3+/5 3+/5  ?Hip adduction 3+/5 3+/5  ?Hip external rotation      ?Hip internal rotation      ?Knee extension 4/5 4/5  ?Knee flexion 4/5 4/5  ?Ankle dorsiflexion       ?Ankle plantarflexion      ?Ankle inversion      ?Ankle eversion      ?Grossly      ?(Blank rows = not tested) ?  ?FUNCTIONAL TESTS:  ?30 Second Sit to Stand: 4 reps ?2MWT: 245ft ?  ?GAIT: ?Distance walked: 281ft ?Assistive device utilized: Single point cane and supplemental O2 ?Level of assistance: CGA ?Comments: very dyspneic with longer distance, decreased gait speed  ?  ?TODAY'S TREATMENT: ?[facility] Adult PT Treatment:                                                DATE: 09/10/21 ?Therapeutic Exercise: ?Nustep 6 min L1 (O2 sats at 3 min 94%, 95% at 6 min) ?Bridge 2x10 ?Hip fallouts RTB 2x10 ?Supine march RTB 2x10 alternating ?Standing heel raises 15x ?Standing to eraises 15x ?Standing marching 10/10 no UE support ?STS with OH reach x5 from wedge ? ? ?[facility] Adult PT Treatment:                                                DATE: 09/01/2021 ?Therapeutic Exercise: ?STS x 5 ?Standing hip abd x 10 each - with UE support ?LAQ x 5 each ?Seated march x 20 ?  ?PATIENT EDUCATION:  ?Education details: eval findings, HEP, POC ?Person educated: Patient ?Education method: Explanation, Demonstration, and Handouts ?Education comprehension: verbalized understanding and returned demonstration ?  ?  ?HOME EXERCISE PROGRAM: ?Access Code: [email or web address] ?URL: [email or web address] ?Date: 09/01/2021 ?Prepared by: Octavio Manns ?  ?Exercises ?- Sit to Stand  - 2-3 x daily - 7 x weekly - 2 sets - 10 reps ?- Standing Hip Abduction with Counter Support  - 2-3 x daily - 7 x  weekly - 2 sets - 10 reps ?- Seated Long Arc Quad  - 2-3 x daily - 7 x weekly - 2 sets - 10 reps - 3" hold hold ?- Seated March  - 2-3 x daily - 7 x weekly -  2 sets - 20 reps ?  ?ASSESSMENT: ?  ?CLINICAL IMPRESSION: ?Patient returns for first f/u visit since initial evaluation.  O2 sats monitored throughout session not dropping below 92% at lowest mark.  Frequent rest breaks requested to accomodates fatigue, exercises focused on B hip strengthening as well as large muscle groups to maximize energy expenditure ?  ?  ?OBJECTIVE IMPAIRMENTS decreased activity tolerance, decreased balance, decreased endurance, decreased mobility, difficulty walking, decreased ROM, decreased strength, and pain.  ?  ?ACTIVITY LIMITATIONS cleaning, community activity, driving, laundry, and yard work.  ?  ?PERSONAL FACTORS Fitness, Time since onset of injury/illness/exacerbation, and 3+ comorbidities: COPD with chronic hypoxemic respiratory failure, diastolic heart failure, HTN, paroxysmal fibrillation, CKD, morbid obesity  are also affecting patient's functional outcome.  ?  ?  ?REHAB POTENTIAL: Good ?  ?CLINICAL DECISION MAKING: Evolving/moderate complexity ?  ?EVALUATION COMPLEXITY: Moderate ?  ?  ?GOALS: ?Goals reviewed with patient? No ?  ?SHORT TERM GOALS: Target date: 09/23/2021 ?  ?Pt will be compliant and knowledgeable with initial HEP for improved comfort and carryover ?Baseline: initial HEP given  ?Goal status: INITIAL ?  ?2.  Pt will be able to stand >71min for improved activity tolerance and ADL performance ?Baseline: 1-2 min ?Goal status: INITIAL ?  ?LONG TERM GOALS: Target date: 10/27/2021 ?  ?Pt will improve reps in 30 Second Sit to Stand to no less than 7 for improved balance and functional mobility ?Baseline: 4 reps ?Goal status: INITIAL ?  ?2.  Pt will be able to ambulate 452ft during 2MWT for improved functional endurance and safety with community navigation ?Baseline: 258ft ?Goal status: INITIAL ?  ?3.  Pt will be able to  perform >10 min of standing activity for improved activity tolerance and functional ability to perform ADLs ?Baseline: 1-2 min ?Goal status: INITIAL ?  ?4.  Pt will increase hip MMT to no less than 4/5 for all

## 2021-09-12 NOTE — Therapy (Signed)
?OUTPATIENT PHYSICAL THERAPY TREATMENT NOTE ? ? ?Patient Name: Zachary Hawkins ?MRN: [id number] ?DOB:10-28-1955, 66 y.o., male ?Today's Date: 09/15/2021 ? ?PCP: Dorise Hiss, PA-C ?REFERRING PROVIDER: Nita Sells, MD ? ?END OF SESSION:  ? PT End of Session - 09/15/21 1312   ? ? Visit Number 3   ? Number of Visits 17   ? Date for PT Re-Evaluation 10/27/21   ? Authorization Type M[employer]   ? PT Start Time 1315   ? PT Stop Time 1400   ? PT Time Calculation (min) 45 min   ? Equipment Utilized During Treatment Oxygen   ? Activity Tolerance Patient limited by fatigue   ? Behavior During Therapy [facility] for tasks assessed/performed   ? ?  ?  ? ?  ? ? ? ?Past Medical History:  ?Diagnosis Date  ? Asthma   ? BPH (benign prostatic hyperplasia)   ? Cardiomyopathy ([place]) 06/2017  ? EF 40-45%  ? CHF (congestive heart failure) ([place])   ? Chronic renal insufficiency, stage 3 (moderate) (HCC)   ? COPD (chronic obstructive pulmonary disease) ([place])   ? Diastolic dysfunction [id number]  ? grade 2   ? Hyperlipidemia   ? Hypertension   ? Hypertensive urgency   ? Obesity   ? Obesity   ? OSA (obstructive sleep apnea)   ? Paroxysmal atrial fibrillation ([place]) 2022  ? on Eliquis  ? ?History reviewed. No pertinent surgical history. ?Patient Active Problem List  ? Diagnosis Date Noted  ? Acute hypoxemic respiratory failure ([place]) 08/14/2021  ? Acute encephalopathy   ? Chronic diastolic heart failure ([place])   ? Chronic obstructive pulmonary disease with acute exacerbation ([place])   ? Hyperkalemia   ? Class 3 obesity ([place]) 07/18/2021  ? Acute on chronic diastolic heart failure ([place]) 07/17/2021  ? BPH (benign prostatic hyperplasia)   ? Hyperlipidemia   ? Acute on chronic respiratory failure with hypoxia and hypercapnia ([place]) 06/23/2021  ? Chronic diastolic CHF (congestive heart failure) ([place]) 06/23/2021  ? COPD with acute exacerbation ([place]) 06/23/2021  ? Acute on chronic diastolic (congestive) heart failure ([place]) 04/30/2021  ? AKI  (acute kidney injury) ([place]) 04/29/2021  ? Acute exacerbation of CHF (congestive heart failure) ([place]) 04/29/2021  ? Chronic respiratory failure with hypercapnia ([place]) 04/29/2021  ? Bilateral hydrocele 04/29/2021  ? Dyspnea on exertion 04/29/2021  ? Acute respiratory failure with hypoxia ([place]) 03/10/2021  ? Urticaria 01/15/2021  ? Chronic heart failure with preserved ejection fraction ([place])   ? Angioedema 01/04/2021  ? Influenza vaccine refused 06/26/2020  ? COVID-19 vaccine series completed 06/26/2020  ? Paroxysmal atrial fibrillation ([place]) 07/02/2019  ? Secondary hypercoagulable state ([place]) 07/02/2019  ? OSA (obstructive sleep apnea) 04/03/2019  ? History of angioedema due to ACE INHIBITORS 10/13/2017  ? Non compliance w medication regimen 07/13/2017  ? Essential hypertension 06/23/2017  ? Acute kidney injury superimposed on chronic kidney disease ([place])   ? Cardiomyopathy ([place]) 06/06/2017  ? Dyspnea 06/05/2017  ? ? ?REFERRING DIAG: Unsteadiness on feet (R26.81);Other abnormalities of gait and mobility (R26.89);Muscle weakness (generalized) (M62.81) ? ?THERAPY DIAG: Muscle weakness (generalized) ?  ?Difficulty in walking, not elsewhere classified ?  ?Other abnormalities of gait and mobility ?  ?Unsteadiness on feet ? ? ?PERTINENT HISTORY: COPD with chronic hypoxemic respiratory failure, diastolic heart failure, HTN, paroxysmal fibrillation, CKD, morbid obesity ? ?PRECAUTIONS: Fall, respiratory ? ?SUBJECTIVE: Describes pain in lower back in the morning. Presents on 3L of O2. ? ?PAIN:  ?Are you having pain? No :  NPRS scale: 0/10 currently (10/10 in morning) ?Pain location: low back ?Pain description: ache  ?Aggravating factors: activity ?Relieving factors: movement  ? ? ?OBJECTIVE: (objective measures completed at initial evaluation unless otherwise dated) ? ? ?OBJECTIVE:  ?  ?VITALS: ?          SpO2: 94% on 3L  ? BP L UE sitting 117/89 ?  ?DIAGNOSTIC FINDINGS:  ?          N/A ?  ?PATIENT SURVEYS:  ?N/A ?   ?COGNITION: ?          Overall cognitive status: Within functional limits for tasks assessed               ?           ?SENSATION: ?WFL ?  ?POSTURE:  ?Large body habitus; on supplemental O2, fwd head ?  ?PALPATION: ?N/A ?  ?LE MMT: ?  ?MMT Right ?09/02/2021 Left ?09/02/2021  ?Hip flexion  3+/5 3+/5  ?Hip extension      ?Hip abduction 3+/5 3+/5  ?Hip adduction 3+/5 3+/5  ?Hip external rotation      ?Hip internal rotation      ?Knee extension 4/5 4/5  ?Knee flexion 4/5 4/5  ?Ankle dorsiflexion       ?Ankle plantarflexion      ?Ankle inversion      ?Ankle eversion      ?Grossly      ?(Blank rows = not tested) ?  ?FUNCTIONAL TESTS:  ?30 Second Sit to Stand: 4 reps ?2MWT: 250ft ?  ?GAIT: ?Distance walked: 210ft ?Assistive device utilized: Single point cane and supplemental O2 ?Level of assistance: CGA ?Comments: very dyspneic with longer distance, decreased gait speed  ?  ?TODAY'S TREATMENT: ?[facility] Adult PT Treatment:                                                DATE: 09/15/2021 ?Therapeutic Exercise: ?Nustep 5 min L1 (O2 sats at start 90%, 3 min 95%, 5 min 96%) ?Standing heel raises 15x ?Standing toe raises 15x ?Seated heel/toe raises x15 ?Seated marching 2x10 BIL ?LAQ 2x10 BIL ?Standing hip abduction x10 BIL ?STS with OH reach x5 form EOM ? ? ?[facility] Adult PT Treatment:                                                DATE: 09/10/21 ?Therapeutic Exercise: ?Nustep 6 min L1 (O2 sats at 3 min 94%, 95% at 6 min) ?Bridge 2x10 ?Hip fallouts RTB 2x10 ?Supine march RTB 2x10 alternating ?Standing heel raises 15x ?Standing to eraises 15x ?Standing marching 10/10 no UE support ?STS with OH reach x5 from wedge ? ? ?[facility] Adult PT Treatment:                                                DATE: 09/01/2021 ?Therapeutic Exercise: ?STS x 5 ?Standing hip abd x 10 each - with UE support ?LAQ x 5 each ?Seated march x 20 ?  ?PATIENT EDUCATION:  ?Education details: eval findings, HEP, POC ?Person educated: Patient ?Education method: Explanation,  Demonstration, and  Handouts ?Education comprehension: verbalized understanding and returned demonstration ?  ?  ?HOME EXERCISE PROGRAM: ?Access Code: [email or web address] ?URL: [email or web address] ?Date: 09/01/2021 ?Prepared by: Octavio Manns ?  ?Exercises ?- Sit to Stand  - 2-3 x daily - 7 x weekly - 2 sets - 10 reps ?- Standing Hip Abduction with Counter Support  - 2-3 x daily - 7 x weekly - 2 sets - 10 reps ?- Seated Long Arc Quad  - 2-3 x daily - 7 x weekly - 2 sets - 10 reps - 3" hold hold ?- Seated March  - 2-3 x daily - 7 x weekly - 2 sets - 20 reps ?  ?ASSESSMENT: ?  ?CLINICAL IMPRESSION: ?Patient presents to PT on 3L of O2 and O2 sats were monitored through session, lowest being 90% at beginning of session when walking from lobby to Nustep. Session today focused on general LE strengthening and improving activity tolerance. He remains limited throughout session by fatigue, requiring frequent rest breaks throughout session. Noticed patients O2 tank was on red "refill" towards end of session, grabbed his new tank from his vehicle and changed out O2 tanks. Patient continues to benefit from skilled PT services and should be progressed as able to improve functional independence. ?  ?  ?OBJECTIVE IMPAIRMENTS decreased activity tolerance, decreased balance, decreased endurance, decreased mobility, difficulty walking, decreased ROM, decreased strength, and pain.  ?  ?ACTIVITY LIMITATIONS cleaning, community activity, driving, laundry, and yard work.  ?  ?PERSONAL FACTORS Fitness, Time since onset of injury/illness/exacerbation, and 3+ comorbidities: COPD with chronic hypoxemic respiratory failure, diastolic heart failure, HTN, paroxysmal fibrillation, CKD, morbid obesity  are also affecting patient's functional outcome.  ?  ?  ?REHAB POTENTIAL: Good ?  ?CLINICAL DECISION MAKING: Evolving/moderate complexity ?  ?EVALUATION COMPLEXITY: Moderate ?  ?  ?GOALS: ?Goals reviewed with patient? No ?  ?SHORT TERM GOALS:  Target date: 09/23/2021 ?  ?Pt will be compliant and knowledgeable with initial HEP for improved comfort and carryover ?Baseline: initial HEP given  ?Goal status: INITIAL ?  ?2.  Pt will be able to stand >44min for improved

## 2021-09-15 ENCOUNTER — Ambulatory Visit: Payer: Medicaid Other

## 2021-09-15 DIAGNOSIS — R262 Difficulty in walking, not elsewhere classified: Secondary | ICD-10-CM

## 2021-09-15 DIAGNOSIS — R2681 Unsteadiness on feet: Secondary | ICD-10-CM

## 2021-09-15 DIAGNOSIS — R2689 Other abnormalities of gait and mobility: Secondary | ICD-10-CM

## 2021-09-15 DIAGNOSIS — M6281 Muscle weakness (generalized): Secondary | ICD-10-CM | POA: Diagnosis not present

## 2021-09-16 NOTE — Progress Notes (Signed)
? ?Synopsis: Referred for decreased O2 saturation, restrictive lung disease, dyspnea by McVey, Letta Median* ? ?Subjective:  ? ?PATIENT ID: Zachary Hawkins GENDER: male DOB: October 12, 1955, MRN: [id number] ? ?No chief complaint on file. ?Chief complaint: dyspnea ? ?65yM with history of CHF (EF 40-45% now recovered, G2DD -> G1DD), moderate OSA not on CPAP since he couldn't afford it, AF on eliquis, obesity, smoking 15-20 years half ppd quit 01/04/21, ACEi angioedema 10/2017, anaphylaxis due to black cherries requiring hospitalization and intubation dc'd home with steroid taper 9/2, recent discharge from ICU 9/10 after giving himself an epi pen injection 9/7 after he felt his throat closing up after dinner. He was referred to allergy/immunology.  ? ?He has some DOE. Taking advair one puff BID. No cough especially since stopping smoking.  ? ?He never had any itching during either of the above episodes. Tryptase was negative.  ? ?He is finishing course of steroids.  ? ?Snores. Some PND. Some daytime sleepiness.  ? ?He had an episode yesterday where home oxygen cut off and it worried him.  ? ?Sister has OSA ? 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  ? ?Interval HPI ? ?Since last visit hospitalized twice for acute on chronic hypercapnic hypoxic respiratory failure requiring NIV, intubation in April. Discharged on torsemide 80 daily, spironolactone 25 mg daily, SGLT2i. ? ?He doesn't really have cough.  ? ?He thinks he's doing well since leaving hospital. Weight is back up to 346 lb however. Down to 325 lb at end of last hospitalization. Takes all of his medicines in blister pack which pharmacy arranges. Home health nurse is helping him with adherence.  ? ?He says he's using trelegy most nights.  ? ?Otherwise pertinent review of systems is negative. ? ? ? ? ?Past Medical History:  ?Diagnosis Date  ? Asthma   ? BPH (benign prostatic hyperplasia)   ? Cardiomyopathy ([place]) 06/2017  ? EF 40-45%  ? CHF  (congestive heart failure) ([place])   ? Chronic renal insufficiency, stage 3 (moderate) (HCC)   ? COPD (chronic obstructive pulmonary disease) ([place])   ? Diastolic dysfunction [id number]  ? grade 2   ? Hyperlipidemia   ? Hypertension   ? Hypertensive urgency   ? Obesity   ? Obesity   ? OSA (obstructive sleep apnea)   ? Paroxysmal atrial fibrillation ([place]) 2022  ? on Eliquis  ?  ? ?Family History  ?Problem Relation Age of Onset  ? Diabetes Mother   ? Diabetes Father   ?  ? ?No past surgical history on file. ? ?Social History  ? ?Socioeconomic History  ? Marital status: Single  ?  Spouse name: Not on file  ? Number of children: Not on file  ? Years of education: Not on file  ? Highest education level: Not on file  ?Occupational History  ? Not on file  ?Tobacco Use  ? Smoking status: Former  ?  Packs/day: 1.00  ?  Years: 36.00  ?  Pack years: 36.00  ?  Types: Cigarettes  ?  Quit date: 01/08/2021  ?  Years since quitting: 0.6  ? Smokeless tobacco: Never  ?Substance and Sexual Activity  ? Alcohol use: Yes  ?  Alcohol/week: 1.0 standard drink  ?  Types: 1 Shots of liquor per week  ?  Comment: socially  ? Drug use: Yes  ?  Types: Marijuana  ?  Comment: every other week  ? Sexual activity: Not on file  ?Other Topics Concern  ?  Not on file  ?Social History Narrative  ? Not on file  ? ?Social Determinants of Health  ? ?Financial Resource Strain: High Risk  ? Difficulty of Paying Living Expenses: Very hard  ?Food Insecurity: No Food Insecurity  ? Worried About Charity fundraiser in the Last Year: Never true  ? Ran Out of Food in the Last Year: Never true  ?Transportation Needs: No Transportation Needs  ? Lack of Transportation (Medical): No  ? Lack of Transportation (Non-Medical): No  ?Physical Activity: Not on file  ?Stress: Not on file  ?Social Connections: Not on file  ?Intimate Partner Violence: Not on file  ?  ? ?Allergies  ?Allergen Reactions  ? Ace Inhibitors Anaphylaxis  ? Black Cherry Fruit Extract [Cherry Extract]  Anaphylaxis  ?  Tongue  ? [place] Flavor [Flavoring Agent] Anaphylaxis  ?  ? ?Outpatient Medications Prior to Visit  ?Medication Sig Dispense Refill  ? ADVAIR HFA 115-21 MCG/ACT inhaler INHALE 2 PUFFS INTO THE LUNGS 2 (TWO) TIMES DAILY. 36 g 12  ? albuterol (VENTOLIN HFA) 108 (90 Base) MCG/ACT inhaler Inhale 2 puffs into the lungs every 4 (four) hours as needed for wheezing or shortness of breath. 18 g 0  ? amLODipine (NORVASC) 5 MG tablet Take 1 tablet (5 mg total) by mouth daily. 30 tablet 0  ? apixaban (ELIQUIS) 5 MG TABS tablet Take 1 tablet (5 mg total) by mouth 2 (two) times daily. 60 tablet 5  ? atorvastatin (LIPITOR) 10 MG tablet Take 10 mg by mouth daily.    ? bisoprolol (ZEBETA) 5 MG tablet Take 1 tablet (5 mg total) by mouth daily. 90 tablet 3  ? budesonide (PULMICORT) 0.25 MG/2ML nebulizer solution Take 2 mLs (0.25 mg total) by nebulization 2 (two) times daily. 60 mL 12  ? dapagliflozin propanediol (FARXIGA) 10 MG TABS tablet Take 1 tablet (10 mg total) by mouth daily. 30 tablet 0  ? doxazosin (CARDURA) 2 MG tablet Take 1 tablet (2 mg total) by mouth daily. 30 tablet 3  ? EPINEPHrine 0.3 mg/0.3 mL IJ SOAJ injection Inject 0.3 mg into the muscle as needed for anaphylaxis. (Patient taking differently: Inject 0.3 mg into the muscle once as needed for anaphylaxis.) 1 each 1  ? famotidine (PEPCID) 20 MG tablet Take 1 tablet (20 mg total) by mouth 2 (two) times daily. 28 tablet 0  ? fluticasone (FLONASE) 50 MCG/ACT nasal spray Place 1 spray into both nostrils daily. 16 g 2  ? Multiple Vitamin (MULTIVITAMIN WITH MINERALS) TABS tablet Take 1 tablet by mouth daily.    ? pantoprazole (PROTONIX) 40 MG tablet Take 1 tablet (40 mg total) by mouth daily. 30 tablet 3  ? saline (AYR) GEL Place 1 application into both nostrils at bedtime. 14.1 g 11  ? spironolactone (ALDACTONE) 25 MG tablet Take 1 tablet (25 mg total) by mouth daily. 90 tablet 3  ? tamsulosin (FLOMAX) 0.4 MG CAPS capsule Take 1 capsule (0.4 mg total)  by mouth daily after supper. 30 capsule 2  ? torsemide (DEMADEX) 20 MG tablet Take 4 tablets (80 mg total) by mouth daily. 120 tablet 0  ? ?No facility-administered medications prior to visit.  ? ? ? ? ? ?Objective:  ? ?Physical Exam: ? ?General appearance: 66 y.o., male, NAD, conversant  ?Eyes: anicteric sclerae; PERRL, tracking appropriately ?HENT: NCAT; MMM ?Neck: Trachea midline; no lymphadenopathy, no JVD ?Lungs: distant, no crackles, no wheeze, with normal respiratory effort on 2L O2 ?CV: RRR, no murmur  ?Abdomen:  Soft, non-tender; non-distended, BS present  ?Extremities: 1+ nonpitting peripheral edema, warm ?Skin: Normal turgor and texture; no rash ?Psych: Appropriate affect ?Neuro: Alert and oriented to person and place, no focal deficit  ? ? ? ?Vitals:  ? 09/17/21 1608  ?BP: 130/84  ?Pulse: 90  ?Temp: 98.9 ?F (37.2 ?C)  ?TempSrc: Oral  ?SpO2: 94%  ?Weight: (!) 346 lb (156.9 kg)  ?Height: 6\' 1"  (1.854 m)  ? ? ? ? ?94% on 3L ?BMI Readings from Last 3 Encounters:  ?09/17/21 45.65 kg/m?  ?08/21/21 43.08 kg/m?  ?08/05/21 45.65 kg/m?  ? ?Wt Readings from Last 3 Encounters:  ?09/17/21 (!) 346 lb (156.9 kg)  ?08/21/21 (!) 326 lb 8 oz (148.1 kg)  ?08/05/21 (!) 346 lb (156.9 kg)  ? ? ? ?CBC ?   ?Component Value Date/Time  ? WBC 8.7 08/20/2021 0107  ? RBC 4.74 08/20/2021 0107  ? HGB 13.3 08/20/2021 0107  ? HGB 16.7 07/11/2020 1103  ? HCT 45.0 08/20/2021 0107  ? HCT 50.3 07/11/2020 1103  ? PLT 247 08/20/2021 0107  ? PLT 232 07/11/2020 1103  ? MCV 94.9 08/20/2021 0107  ? MCV 88 07/11/2020 1103  ? MCH 28.1 08/20/2021 0107  ? MCHC 29.6 (L) 08/20/2021 0107  ? RDW 15.8 (H) 08/20/2021 0107  ? RDW 13.6 07/11/2020 1103  ? LYMPHSABS 2.5 08/14/2021 1655  ? MONOABS 1.4 (H) 08/14/2021 1655  ? EOSABS 0.4 08/14/2021 1655  ? BASOSABS 0.1 08/14/2021 1655  ? ?C4 31 ?IgE 394 ?C1 inh above normal  ?Tryptase WNL ? ?Chest Imaging: ?CXR 01/13/21 reviewed by me and unremarkable ? ?CT A/P 04/29/21 lung bases reviewed by with RLL scar and lower  lobe ?cysts  ? ?CTA Chest 05/11/21 reviewed by me with bronchial wall thickening, stable scar ? ?CXR 08/17/21 reviewed by me with cardiomegaly, likely RLL atelectasis, possible retrocardiac opacity ? ?Pulmonary Functions

## 2021-09-17 ENCOUNTER — Encounter: Payer: Self-pay | Admitting: Student

## 2021-09-17 ENCOUNTER — Ambulatory Visit: Payer: Medicaid Other

## 2021-09-17 ENCOUNTER — Ambulatory Visit (INDEPENDENT_AMBULATORY_CARE_PROVIDER_SITE_OTHER): Payer: Medicaid Other | Admitting: Student

## 2021-09-17 VITALS — BP 130/84 | HR 90 | Temp 98.9°F | Ht 73.0 in | Wt 346.0 lb

## 2021-09-17 DIAGNOSIS — R2689 Other abnormalities of gait and mobility: Secondary | ICD-10-CM

## 2021-09-17 DIAGNOSIS — G4733 Obstructive sleep apnea (adult) (pediatric): Secondary | ICD-10-CM

## 2021-09-17 DIAGNOSIS — M6281 Muscle weakness (generalized): Secondary | ICD-10-CM

## 2021-09-17 DIAGNOSIS — J9611 Chronic respiratory failure with hypoxia: Secondary | ICD-10-CM | POA: Diagnosis not present

## 2021-09-17 DIAGNOSIS — Z9989 Dependence on other enabling machines and devices: Secondary | ICD-10-CM

## 2021-09-17 DIAGNOSIS — R262 Difficulty in walking, not elsewhere classified: Secondary | ICD-10-CM

## 2021-09-17 DIAGNOSIS — R0609 Other forms of dyspnea: Secondary | ICD-10-CM | POA: Diagnosis not present

## 2021-09-17 MED ORDER — TORSEMIDE 100 MG PO TABS: 100.0000 mg | ORAL_TABLET | Freq: Every day | ORAL | 11 refills | Status: DC

## 2021-09-17 NOTE — Patient Instructions (Addendum)
-   We have increased your dose of torsemide to 100 mg daily. [facility] get this higher dose of torsemide included in your bubble packs of medications. You should take your old unopened bubble packs of medicines to pharmacy when you go to pick up new ones. Goal is for your weight to get under 340 lb.  ?- We need to check a BMP (blood draw) on 09/28/21 Monday ?- I'll review download of your trilogy ventilator before next visit ?

## 2021-09-17 NOTE — Therapy (Signed)
?OUTPATIENT PHYSICAL THERAPY TREATMENT NOTE ? ? ?Patient Name: Zachary Hawkins ?MRN: [id number] ?DOB:12-21-1955, 66 y.o., male ?Today's Date: 09/17/2021 ? ?PCP: Dorise Hiss, PA-C ?REFERRING PROVIDER: Nita Sells, MD ? ?END OF SESSION:  ? PT End of Session - 09/17/21 1403   ? ? Visit Number 4   ? Number of Visits 17   ? Date for PT Re-Evaluation 10/27/21   ? Authorization Type MCD [employer]   ? PT Start Time 1400   ? PT Stop Time 1440   ? PT Time Calculation (min) 40 min   ? Equipment Utilized During Treatment Oxygen   ? Activity Tolerance Patient limited by fatigue   ? Behavior During Therapy [facility] for tasks assessed/performed   ? ?  ?  ? ?  ? ? ? ?Past Medical History:  ?Diagnosis Date  ? Asthma   ? BPH (benign prostatic hyperplasia)   ? Cardiomyopathy ([place]) 06/2017  ? EF 40-45%  ? CHF (congestive heart failure) ([place])   ? Chronic renal insufficiency, stage 3 (moderate) (HCC)   ? COPD (chronic obstructive pulmonary disease) ([place])   ? Diastolic dysfunction [id number]  ? grade 2   ? Hyperlipidemia   ? Hypertension   ? Hypertensive urgency   ? Obesity   ? Obesity   ? OSA (obstructive sleep apnea)   ? Paroxysmal atrial fibrillation ([place]) 2022  ? on Eliquis  ? ?History reviewed. No pertinent surgical history. ?Patient Active Problem List  ? Diagnosis Date Noted  ? Acute hypoxemic respiratory failure ([place]) 08/14/2021  ? Acute encephalopathy   ? Chronic diastolic heart failure ([place])   ? Chronic obstructive pulmonary disease with acute exacerbation ([place])   ? Hyperkalemia   ? Class 3 obesity ([place]) 07/18/2021  ? Acute on chronic diastolic heart failure ([place]) 07/17/2021  ? BPH (benign prostatic hyperplasia)   ? Hyperlipidemia   ? Acute on chronic respiratory failure with hypoxia and hypercapnia ([place]) 06/23/2021  ? Chronic diastolic CHF (congestive heart failure) ([place]) 06/23/2021  ? COPD with acute exacerbation ([place]) 06/23/2021  ? Acute on chronic diastolic (congestive) heart failure ([place]) 04/30/2021  ? AKI  (acute kidney injury) ([place]) 04/29/2021  ? Acute exacerbation of CHF (congestive heart failure) ([place]) 04/29/2021  ? Chronic respiratory failure with hypercapnia ([place]) 04/29/2021  ? Bilateral hydrocele 04/29/2021  ? Dyspnea on exertion 04/29/2021  ? Acute respiratory failure with hypoxia ([place]) 03/10/2021  ? Urticaria 01/15/2021  ? Chronic heart failure with preserved ejection fraction ([place])   ? Angioedema 01/04/2021  ? Influenza vaccine refused 06/26/2020  ? COVID-19 vaccine series completed 06/26/2020  ? Paroxysmal atrial fibrillation ([place]) 07/02/2019  ? Secondary hypercoagulable state ([place]) 07/02/2019  ? OSA (obstructive sleep apnea) 04/03/2019  ? History of angioedema due to ACE INHIBITORS 10/13/2017  ? Non compliance w medication regimen 07/13/2017  ? Essential hypertension 06/23/2017  ? Acute kidney injury superimposed on chronic kidney disease ([place])   ? Cardiomyopathy ([place]) 06/06/2017  ? Dyspnea 06/05/2017  ? ? ?REFERRING DIAG: Unsteadiness on feet (R26.81);Other abnormalities of gait and mobility (R26.89);Muscle weakness (generalized) (M62.81) ? ?THERAPY DIAG: Muscle weakness (generalized) ?  ?Difficulty in walking, not elsewhere classified ?  ?Other abnormalities of gait and mobility ?  ?Unsteadiness on feet ? ? ?PERTINENT HISTORY: COPD with chronic hypoxemic respiratory failure, diastolic heart failure, HTN, paroxysmal fibrillation, CKD, morbid obesity ? ?PRECAUTIONS: Fall, respiratory ? ?SUBJECTIVE: No change in symptoms, no reports of SOB, no issues with fatigue or soreness following treatment sessions ? ?PAIN:  ?Are  you having pain? No : NPRS scale: 0/10 currently (10/10 in morning) ?Pain location: low back ?Pain description: ache  ?Aggravating factors: activity ?Relieving factors: movement  ? ? ?OBJECTIVE: (objective measures completed at initial evaluation unless otherwise dated) ? ? ?OBJECTIVE:  ?  ?VITALS: (09/17/21) ?          SpO2: 94% on 3L  ? BP L UE sitting 148/88 ?  ?DIAGNOSTIC FINDINGS:  ?           N/A ?  ?PATIENT SURVEYS:  ?N/A ?  ?COGNITION: ?          Overall cognitive status: Within functional limits for tasks assessed               ?           ?SENSATION: ?WFL ?  ?POSTURE:  ?Large body habitus; on supplemental O2, fwd head ?  ?PALPATION: ?N/A ?  ?LE MMT: ?  ?MMT Right ?09/02/2021 Left ?09/02/2021  ?Hip flexion  3+/5 3+/5  ?Hip extension      ?Hip abduction 3+/5 3+/5  ?Hip adduction 3+/5 3+/5  ?Hip external rotation      ?Hip internal rotation      ?Knee extension 4/5 4/5  ?Knee flexion 4/5 4/5  ?Ankle dorsiflexion       ?Ankle plantarflexion      ?Ankle inversion      ?Ankle eversion      ?Grossly      ?(Blank rows = not tested) ?  ?FUNCTIONAL TESTS:  ?30 Second Sit to Stand: 4 reps ?2MWT: 28ft ?  ?GAIT: ?Distance walked: 229ft ?Assistive device utilized: Single point cane and supplemental O2 ?Level of assistance: CGA ?Comments: very dyspneic with longer distance, decreased gait speed  ?  ?TODAY'S TREATMENT: ?[facility] [place] Adult PT Treatment:                                                DATE: 09/17/21 ?Therapeutic Exercise: ?Nustep 7 min L1 (O2 sats pre 96%, 89% post) ?Bridge 15x ?Hip fallouts RTB 15x2 ?Supine march 15/15 ?Standing heel raises 15x ?Standing toe raises 15x ?Standing marching 15/15 no UE support ?STS with OH reach x5 from table ?FAQs with adduction 15x(larger ball) ? ?[facility] Adult PT Treatment:                                                DATE: 09/15/2021 ?Therapeutic Exercise: ?Nustep 5 min L1 (O2 sats at start 90%, 3 min 95%, 5 min 96%) ?Standing heel raises 15x ?Standing toe raises 15x ?Seated heel/toe raises x15 ?Seated marching 2x10 BIL ?LAQ 2x10 BIL ?Standing hip abduction x10 BIL ?STS with OH reach x5 form EOM ? ? ?[facility] Adult PT Treatment:                                                DATE: 09/10/21 ?Therapeutic Exercise: ?Nustep 6 min L1 (O2 sats at 3 min 94%, 95% at 6 min) ?Bridge 2x10 ?Hip fallouts RTB 2x10 ?Supine march RTB 2x10 alternating ?Standing heel raises 15x ?Standing to e raises  15x ?Standing marching  10/10 no UE support ?STS with OH reach x5 from wedge ? ? ?[facility] Adult PT Treatment:                                                DATE: 09/01/2021 ?Therapeutic Exercise: ?STS x 5 ?Standing hip abd x 10 each - with UE support ?LAQ x 5 each ?Seated march x 20 ?  ?PATIENT EDUCATION:  ?Education details: eval findings, HEP, POC ?Person educated: Patient ?Education method: Explanation, Demonstration, and Handouts ?Education comprehension: verbalized understanding and returned demonstration ?  ?  ?HOME EXERCISE PROGRAM: ?Access Code: [email or web address] ?URL: [email or web address] ?Date: 09/01/2021 ?Prepared by: Octavio Manns ?  ?Exercises ?- Sit to Stand  - 2-3 x daily - 7 x weekly - 2 sets - 10 reps ?- Standing Hip Abduction with Counter Support  - 2-3 x daily - 7 x weekly - 2 sets - 10 reps ?- Seated Long Arc Quad  - 2-3 x daily - 7 x weekly - 2 sets - 10 reps - 3" hold hold ?- Seated March  - 2-3 x daily - 7 x weekly - 2 sets - 20 reps ?  ?ASSESSMENT: ?  ?CLINICAL IMPRESSION: ?Patient presents to PT on 3L of O2 and O2 sats were monitored through session, lowest being 89% at end of Nustep.  Continued to focus on large muscle groups, high reps, cuing for proper breathing and body mechanics.  Patient has pulmonology appointment immediately following PT.  Able to tolerate more tasks in PT w/o fatiguing as easily. ?  ?  ?OBJECTIVE IMPAIRMENTS decreased activity tolerance, decreased balance, decreased endurance, decreased mobility, difficulty walking, decreased ROM, decreased strength, and pain.  ?  ?ACTIVITY LIMITATIONS cleaning, community activity, driving, laundry, and yard work.  ?  ?PERSONAL FACTORS Fitness, Time since onset of injury/illness/exacerbation, and 3+ comorbidities: COPD with chronic hypoxemic respiratory failure, diastolic heart failure, HTN, paroxysmal fibrillation, CKD, morbid obesity  are also affecting patient's functional outcome.  ?  ?  ?REHAB POTENTIAL: Good ?  ?CLINICAL  DECISION MAKING: Evolving/moderate complexity ?  ?EVALUATION COMPLEXITY: Moderate ?  ?  ?GOALS: ?Goals reviewed with patient? No ?  ?SHORT TERM GOALS: [place] date: 09/23/2021 ?  ?Pt will be compliant and kno

## 2021-09-18 ENCOUNTER — Telehealth: Payer: Self-pay | Admitting: Student

## 2021-09-18 NOTE — Telephone Encounter (Signed)
Called and spoke with scott. Advised scott that they increased the patients dosage and advised patient where to get it from.  ? ?Nothing further needed.  ?

## 2021-09-22 ENCOUNTER — Ambulatory Visit: Payer: Medicaid Other

## 2021-09-22 DIAGNOSIS — R262 Difficulty in walking, not elsewhere classified: Secondary | ICD-10-CM

## 2021-09-22 DIAGNOSIS — R2681 Unsteadiness on feet: Secondary | ICD-10-CM

## 2021-09-22 DIAGNOSIS — M6281 Muscle weakness (generalized): Secondary | ICD-10-CM | POA: Diagnosis not present

## 2021-09-22 DIAGNOSIS — R2689 Other abnormalities of gait and mobility: Secondary | ICD-10-CM

## 2021-09-22 NOTE — Therapy (Signed)
?OUTPATIENT PHYSICAL THERAPY TREATMENT NOTE ? ? ?Patient Name: Zachary Hawkins ?MRN: [id number] ?DOB:1955/07/28, 66 y.o., male ?Today's Date: 09/22/2021 ? ?PCP: Dorise Hiss, PA-C ?REFERRING PROVIDER: Nita Sells, MD ? ?END OF SESSION:  ? PT End of Session - 09/22/21 1403   ? ? Visit Number 5   ? Number of Visits 17   ? Date for PT Re-Evaluation 10/27/21   ? Authorization Type M[employer]   ? PT Start Time 1400   ? PT Stop Time [id number]   ? PT Time Calculation (min) 45 min   ? Equipment Utilized During Treatment Oxygen   ? Activity Tolerance Patient limited by fatigue   ? Behavior During Therapy [facility] for tasks assessed/performed   ? ?  ?  ? ?  ? ? ? ? ?Past Medical History:  ?Diagnosis Date  ? Asthma   ? BPH (benign prostatic hyperplasia)   ? Cardiomyopathy ([place]) 06/2017  ? EF 40-45%  ? CHF (congestive heart failure) ([place])   ? Chronic renal insufficiency, stage 3 (moderate) (HCC)   ? COPD (chronic obstructive pulmonary disease) ([place])   ? Diastolic dysfunction [id number]  ? grade 2   ? Hyperlipidemia   ? Hypertension   ? Hypertensive urgency   ? Obesity   ? Obesity   ? OSA (obstructive sleep apnea)   ? Paroxysmal atrial fibrillation ([place]) 2022  ? on Eliquis  ? ?History reviewed. No pertinent surgical history. ?Patient Active Problem List  ? Diagnosis Date Noted  ? Acute hypoxemic respiratory failure ([place]) 08/14/2021  ? Acute encephalopathy   ? Chronic diastolic heart failure ([place])   ? Chronic obstructive pulmonary disease with acute exacerbation ([place])   ? Hyperkalemia   ? Class 3 obesity ([place]) 07/18/2021  ? Acute on chronic diastolic heart failure ([place]) 07/17/2021  ? BPH (benign prostatic hyperplasia)   ? Hyperlipidemia   ? Acute on chronic respiratory failure with hypoxia and hypercapnia ([place]) 06/23/2021  ? Chronic diastolic CHF (congestive heart failure) ([place]) 06/23/2021  ? COPD with acute exacerbation ([place]) 06/23/2021  ? Acute on chronic diastolic (congestive) heart failure ([place]) 04/30/2021  ? AKI  (acute kidney injury) ([place]) 04/29/2021  ? Acute exacerbation of CHF (congestive heart failure) ([place]) 04/29/2021  ? Chronic respiratory failure with hypercapnia ([place]) 04/29/2021  ? Bilateral hydrocele 04/29/2021  ? Dyspnea on exertion 04/29/2021  ? Acute respiratory failure with hypoxia ([place]) 03/10/2021  ? Urticaria 01/15/2021  ? Chronic heart failure with preserved ejection fraction ([place])   ? Angioedema 01/04/2021  ? Influenza vaccine refused 06/26/2020  ? COVID-19 vaccine series completed 06/26/2020  ? Paroxysmal atrial fibrillation ([place]) 07/02/2019  ? Secondary hypercoagulable state ([place]) 07/02/2019  ? OSA (obstructive sleep apnea) 04/03/2019  ? History of angioedema due to ACE INHIBITORS 10/13/2017  ? Non compliance w medication regimen 07/13/2017  ? Essential hypertension 06/23/2017  ? Acute kidney injury superimposed on chronic kidney disease ([place])   ? Cardiomyopathy ([place]) 06/06/2017  ? Dyspnea 06/05/2017  ? ? ?REFERRING DIAG: Unsteadiness on feet (R26.81);Other abnormalities of gait and mobility (R26.89);Muscle weakness (generalized) (M62.81) ? ?THERAPY DIAG: Muscle weakness (generalized) ?  ?Difficulty in walking, not elsewhere classified ?  ?Other abnormalities of gait and mobility ?  ?Unsteadiness on feet ? ? ?PERTINENT HISTORY: COPD with chronic hypoxemic respiratory failure, diastolic heart failure, HTN, paroxysmal fibrillation, CKD, morbid obesity ? ?PRECAUTIONS: Fall, respiratory ? ?SUBJECTIVE:  ?Pt presents to PT with no current reports of pain. His supplement O2 tank is very close to empty  upon arrival, with patient and his friend putting in a new tank at start of session. Pt is ready to begin PT at this time.  ? ?PAIN:  ?Are you having pain?  ?No : NPRS scale: 0/10 currently (10/10 in morning) ?Pain location: low back ?Pain description: ache  ?Aggravating factors: activity ?Relieving factors: movement  ? ? ?OBJECTIVE: (objective measures completed at initial evaluation unless otherwise  dated) ? ? ?OBJECTIVE:  ?  ?VITALS: (09/22/2021) ?          SpO2: 94% on 3L  ?HR: 102bpm ? BP L UE sitting 148/88 ?  ?DIAGNOSTIC FINDINGS:  ?          N/A ?  ?PATIENT SURVEYS:  ?N/A ?  ?COGNITION: ?          Overall cognitive status: Within functional limits for tasks assessed               ?           ?SENSATION: ?WFL ?  ?POSTURE:  ?Large body habitus; on supplemental O2, fwd head ?  ?PALPATION: ?N/A ?  ?LE MMT: ?  ?MMT Right ?09/02/2021 Left ?09/02/2021  ?Hip flexion  3+/5 3+/5  ?Hip extension      ?Hip abduction 3+/5 3+/5  ?Hip adduction 3+/5 3+/5  ?Hip external rotation      ?Hip internal rotation      ?Knee extension 4/5 4/5  ?Knee flexion 4/5 4/5  ?Ankle dorsiflexion       ?Ankle plantarflexion      ?Ankle inversion      ?Ankle eversion      ?Grossly      ?(Blank rows = not tested) ?  ?FUNCTIONAL TESTS:  ?30 Second Sit to Stand: 4 reps ?2MWT: 254ft ?  ?GAIT: ?Distance walked: 236ft ?Assistive device utilized: Single point cane and supplemental O2 ?Level of assistance: CGA ?Comments: very dyspneic with longer distance, decreased gait speed  ?  ?TODAY'S TREATMENT: ?[facility] Adult PT Treatment:                                                DATE: 09/22/21 ?Therapeutic Exercise: ?Nustep 3 min L3 (O2 sats pre 95%, 92% post) ?STS with OH reach 2x5 from table ?Seated horizontal abd 2x15 BTB ?Standing hip abd in // x 10 each ?Standing marches x 20 ?Standing hip ext x 10 each in // ?UBE lvl 1.0 x 3 min ( ? ?[facility] Adult PT Treatment:                                                DATE: 09/17/2021 ?Therapeutic Exercise: ?Nustep 7 min L1 (O2 sats pre 96%, 89% post) ?Bridge 15x ?Hip fallouts RTB 15x2 ?Supine march 15/15 ?Standing heel raises 15x ?Standing toe raises 15x ?Standing marching 15/15 no UE support ?STS with OH reach x5 from table ?FAQs with adduction 15x(larger ball) ? ?[facility] Adult PT Treatment:                                                DATE: 09/15/2021 ?Therapeutic Exercise: ?Nustep 5  min L1 (O2 sats at start 90%, 3  min 95%, 5 min 96%) ?Standing heel raises 15x ?Standing toe raises 15x ?Seated heel/toe raises x15 ?Seated marching 2x10 BIL ?LAQ 2x10 BIL ?Standing hip abduction x10 BIL ?STS with OH reach x5 form EOM ? ?PATIENT EDUCATION:  ?Education details: continue HEP ?Person educated: Patient ?Education method: Explanation, Demonstration, and Handouts ?Education comprehension: verbalized understanding and returned demonstration ?  ?  ?HOME EXERCISE PROGRAM: ?Access Code: [email or web address] ?URL: [email or web address] ?Date: 09/01/2021 ?Prepared by: Octavio Manns ?  ?Exercises ?- Sit to Stand  - 2-3 x daily - 7 x weekly - 2 sets - 10 reps ?- Standing Hip Abduction with Counter Support  - 2-3 x daily - 7 x weekly - 2 sets - 10 reps ?- Seated Long Arc Quad  - 2-3 x daily - 7 x weekly - 2 sets - 10 reps - 3" hold hold ?- Seated March  - 2-3 x daily - 7 x weekly - 2 sets - 20 reps ?  ?ASSESSMENT: ?  ?CLINICAL IMPRESSION: ?Pt was able to complete prescribed exercises, but continued to be limited by fatigue and weakness. Therapy focused on improving standing tolerance, LE strength, and general activity tolerance/CV endurance. He continues to benefit from skilled PT services working on improving strength and endurance to increase functional ability. Will continue to progress as tolerated per POC.  ?  ?  ?OBJECTIVE IMPAIRMENTS decreased activity tolerance, decreased balance, decreased endurance, decreased mobility, difficulty walking, decreased ROM, decreased strength, and pain.  ?  ?ACTIVITY LIMITATIONS cleaning, community activity, driving, laundry, and yard work.  ?  ?PERSONAL FACTORS Fitness, Time since onset of injury/illness/exacerbation, and 3+ comorbidities: COPD with chronic hypoxemic respiratory failure, diastolic heart failure, HTN, paroxysmal fibrillation, CKD, morbid obesity  are also affecting patient's functional outcome.  ?  ?GOALS: ?Goals reviewed with patient? No ?  ?SHORT TERM GOALS: Target date: 09/23/2021 ?  ?Pt  will be compliant and knowledgeable with initial HEP for improved comfort and carryover ?Baseline: initial HEP given  ?Goal status: INITIAL ?  ?2.  Pt will be able to stand >32min for improved activity tolerance and

## 2021-09-24 ENCOUNTER — Ambulatory Visit: Payer: Medicaid Other

## 2021-09-24 DIAGNOSIS — M6281 Muscle weakness (generalized): Secondary | ICD-10-CM

## 2021-09-24 DIAGNOSIS — R2681 Unsteadiness on feet: Secondary | ICD-10-CM

## 2021-09-24 DIAGNOSIS — R262 Difficulty in walking, not elsewhere classified: Secondary | ICD-10-CM

## 2021-09-24 DIAGNOSIS — R2689 Other abnormalities of gait and mobility: Secondary | ICD-10-CM

## 2021-09-24 NOTE — Therapy (Signed)
OUTPATIENT PHYSICAL THERAPY TREATMENT NOTE   Patient Name: Zachary Hawkins MRN: [id number] DOB:May 29, 1955, 66 y.o., male Today's Date: 09/24/2021  PCP: Dorise Hiss, PA-C REFERRING PROVIDER: Nita Sells, MD  END OF SESSION:   PT End of Session - 09/24/21 1359     Visit Number 6    Number of Visits 17    Date for PT Re-Evaluation 10/27/21    Authorization Type MCD Healthy Blue    PT Start Time 1400    PT Stop Time 1440    PT Time Calculation (min) 40 min    Equipment Utilized During Treatment Oxygen    Activity Tolerance Patient limited by fatigue    Behavior During Therapy [facility] for tasks assessed/performed               Past Medical History:  Diagnosis Date   Asthma    BPH (benign prostatic hyperplasia)    Cardiomyopathy ([place]) 06/2017   EF 40-45%   CHF (congestive heart failure) (HCC)    Chronic renal insufficiency, stage 3 (moderate) (HCC)    COPD (chronic obstructive pulmonary disease) ([place])    Diastolic dysfunction [id number]   grade 2    Hyperlipidemia    Hypertension    Hypertensive urgency    Obesity    Obesity    OSA (obstructive sleep apnea)    Paroxysmal atrial fibrillation ([place]) 2022   on Eliquis   No past surgical history on file. Patient Active Problem List   Diagnosis Date Noted   Acute hypoxemic respiratory failure ([place]) 08/14/2021   Acute encephalopathy    Chronic diastolic heart failure (HCC)    Chronic obstructive pulmonary disease with acute exacerbation (HCC)    Hyperkalemia    Class 3 obesity ([place]) 07/18/2021   Acute on chronic diastolic heart failure (HCC) 07/17/2021   BPH (benign prostatic hyperplasia)    Hyperlipidemia    Acute on chronic respiratory failure with hypoxia and hypercapnia (HCC) 06/23/2021   Chronic diastolic CHF (congestive heart failure) ([place]) 06/23/2021   COPD with acute exacerbation ([place]) 06/23/2021   Acute on chronic diastolic (congestive) heart failure ([place]) 04/30/2021   AKI (acute kidney  injury) ([place]) 04/29/2021   Acute exacerbation of CHF (congestive heart failure) ([place]) 04/29/2021   Chronic respiratory failure with hypercapnia ([place]) 04/29/2021   Bilateral hydrocele 04/29/2021   Dyspnea on exertion 04/29/2021   Acute respiratory failure with hypoxia ([place]) 03/10/2021   Urticaria 01/15/2021   Chronic heart failure with preserved ejection fraction (HCC)    Angioedema 01/04/2021   Influenza vaccine refused 06/26/2020   COVID-19 vaccine series completed 06/26/2020   Paroxysmal atrial fibrillation ([place]) 07/02/2019   Secondary hypercoagulable state ([place]) 07/02/2019   OSA (obstructive sleep apnea) 04/03/2019   History of angioedema due to ACE INHIBITORS 10/13/2017   Non compliance w medication regimen 07/13/2017   Essential hypertension 06/23/2017   Acute kidney injury superimposed on chronic kidney disease ([place])    Cardiomyopathy ([place]) 06/06/2017   Dyspnea 06/05/2017    REFERRING DIAG: Unsteadiness on feet (R26.81);Other abnormalities of gait and mobility (R26.89);Muscle weakness (generalized) (M62.81)  THERAPY DIAG: Muscle weakness (generalized)   Difficulty in walking, not elsewhere classified   Other abnormalities of gait and mobility   Unsteadiness on feet   PERTINENT HISTORY: COPD with chronic hypoxemic respiratory failure, diastolic heart failure, HTN, paroxysmal fibrillation, CKD, morbid obesity  PRECAUTIONS: Fall, respiratory  SUBJECTIVE:  Feels good no reports of fatigue or SOB, O2 tank 50% full.  Reports a weight gain of 2#  over 24hrs.  PAIN:  Are you having pain?  No : NPRS scale: 0/10 currently (10/10 in morning) Pain location: low back Pain description: ache  Aggravating factors: activity Relieving factors: movement    OBJECTIVE: (objective measures completed at initial evaluation unless otherwise dated)   OBJECTIVE:    VITALS: (09/22/2021)           SpO2: 94% on 3L  HR: 102bpm  BP L UE sitting 148/88   DIAGNOSTIC FINDINGS:             N/A   PATIENT SURVEYS:  N/A   COGNITION:           Overall cognitive status: Within functional limits for tasks assessed                          SENSATION: WFL   POSTURE:  Large body habitus; on supplemental O2, fwd head   PALPATION: N/A   LE MMT:   MMT Right 09/02/2021 Left 09/02/2021  Hip flexion  3+/5 3+/5  Hip extension      Hip abduction 3+/5 3+/5  Hip adduction 3+/5 3+/5  Hip external rotation      Hip internal rotation      Knee extension 4/5 4/5  Knee flexion 4/5 4/5  Ankle dorsiflexion       Ankle plantarflexion      Ankle inversion      Ankle eversion      Grossly      (Blank rows = not tested)   FUNCTIONAL TESTS:  30 Second Sit to Stand: 4 reps 2MWT: 251ft   GAIT: Distance walked: 251ft Assistive device utilized: Single point cane and supplemental O2 Level of assistance: CGA Comments: very dyspneic with longer distance, decreased gait speed    TODAY'S TREATMENT: [facility] Adult PT Treatment:                                                DATE: 09/24/21 Vital signs:  148/98, 148/88  HR 102 O2 sats 97% Therapeutic Exercise: Nustep 7 min L1 (O2 sats pre 96%, 89% post) Self Care: Close monitoring of Vss during session as well as discussion regarding weight gain and need to monitor and notify MD if outside acceptable parameters.  [facility] Adult PT Treatment:                                                DATE: 09/22/21 Therapeutic Exercise: Nustep 3 min L3 (O2 sats pre 95%, 92% post) STS with OH reach 2x5 from table Seated horizontal abd 2x15 BTB Standing hip abd in // x 10 each Standing marches x 20 Standing hip ext x 10 each in // UBE lvl 1.0 x 3 min [facility] Adult PT Treatment:                                                DATE: 09/17/2021 Therapeutic Exercise: Nustep 7 min L1 (O2 sats pre 96%, 89% post) Bridge 15x Hip fallouts RTB 15x2 Supine march 15/15 Standing heel raises 15x Standing  toe raises 15x Standing marching 15/15 no UE support STS  with OH reach x5 from table FAQs with adduction 15x(larger ball)  [facility] Adult PT Treatment:                                                DATE: 09/15/2021 Therapeutic Exercise: Nustep 5 min L1 (O2 sats at start 90%, 3 min 95%, 5 min 96%) Standing heel raises 15x Standing toe raises 15x Seated heel/toe raises x15 Seated marching 2x10 BIL LAQ 2x10 BIL Standing hip abduction x10 BIL STS with OH reach x5 form EOM  PATIENT EDUCATION:  Education details: continue HEP Person educated: Patient Education method: Explanation, Demonstration, and Handouts Education comprehension: verbalized understanding and returned demonstration     HOME EXERCISE PROGRAM: Access Code: [email or web address] URL: [email or web address] Date: 09/01/2021 Prepared by: Octavio Manns   Exercises - Sit to Stand  - 2-3 x daily - 7 x weekly - 2 sets - 10 reps - Standing Hip Abduction with Counter Support  - 2-3 x daily - 7 x weekly - 2 sets - 10 reps - Seated Long Arc Quad  - 2-3 x daily - 7 x weekly - 2 sets - 10 reps - 3" hold hold - Seated March  - 2-3 x daily - 7 x weekly - 2 sets - 20 reps   ASSESSMENT:   CLINICAL IMPRESSION: Arrives to session w/o c/o of SOB or fatigue, O2 tank 50% at LPM, initial BP and HR readings elevated over baseline, re-checked following 4 min of aerobic activity 107 HR and 97% O2.  BP at end of session 148/94 HR 104 and O2 sats 97%. Treatment shortened at patient request due to "feeling tired" and needing to rest.  Discussed need to notify MD if symptoms persist and weight continues to increase.   OBJECTIVE IMPAIRMENTS decreased activity tolerance, decreased balance, decreased endurance, decreased mobility, difficulty walking, decreased ROM, decreased strength, and pain.    ACTIVITY LIMITATIONS cleaning, community activity, driving, laundry, and yard work.    PERSONAL FACTORS Fitness, Time since onset of injury/illness/exacerbation, and 3+ comorbidities: COPD with chronic hypoxemic  respiratory failure, diastolic heart failure, HTN, paroxysmal fibrillation, CKD, morbid obesity  are also affecting patient's functional outcome.    GOALS: Goals reviewed with patient? No   SHORT TERM GOALS: Target date: 09/23/2021   Pt will be compliant and knowledgeable with initial HEP for improved comfort and carryover Baseline: initial HEP given  Goal status: INITIAL   2.  Pt will be able to stand >39min for improved activity tolerance and ADL performance Baseline: 1-2 min Goal status: INITIAL   LONG TERM GOALS: Target date: 10/27/2021   Pt will improve reps in 30 Second Sit to Stand to no less than 7 for improved balance and functional mobility Baseline: 4 reps Goal status: INITIAL   2.  Pt will be able to ambulate 461ft during 2MWT for improved functional endurance and safety with community navigation Baseline: 252ft Goal status: INITIAL   3.  Pt will be able to perform >10 min of standing activity for improved activity tolerance and functional ability to perform ADLs Baseline: 1-2 min Goal status: INITIAL   4.  Pt will increase hip MMT to no less than 4/5 for all tested motions for improved strength and functional mobility Baseline: see chart  Goal status: INITIAL   PLAN: PT FREQUENCY:  2x/week   PT DURATION: 8 weeks   PLANNED INTERVENTIONS: Therapeutic exercises, Therapeutic activity, Neuromuscular re-education, Balance training, Gait training, Patient/Family education, Joint mobilization, Aquatic Therapy, Electrical stimulation, Cryotherapy, Moist heat, and Manual therapy   PLAN FOR NEXT SESSION: assess exercise response, progress HEP, monitor Vss, encourage energy conservation and proper breathing techniques, begin to assess goals progress, f/u on fatigue, elevated HR and weight gain.   Check all possible CPT codes: [id number] - Re-evaluation, 97110- Therapeutic Exercise, [phone]- Neuro Re-education, [phone] - Gait Training, [phone] - Manual Therapy, 97530 - Therapeutic  Activities, and 97535 - Self Care                                    If treatment provided at initial evaluation, no treatment charged due to lack of authorization.                         Lanice Shirts, PT 09/24/2021, 2:39 PM

## 2021-09-29 ENCOUNTER — Ambulatory Visit: Payer: Medicaid Other

## 2021-09-29 DIAGNOSIS — M6281 Muscle weakness (generalized): Secondary | ICD-10-CM

## 2021-09-29 DIAGNOSIS — R262 Difficulty in walking, not elsewhere classified: Secondary | ICD-10-CM

## 2021-09-29 DIAGNOSIS — R2689 Other abnormalities of gait and mobility: Secondary | ICD-10-CM

## 2021-09-29 NOTE — Therapy (Addendum)
OUTPATIENT PHYSICAL THERAPY TREATMENT NOTE   Patient Name: Zachary Hawkins MRN: [id number] DOB:1955/12/29, 66 y.o., male Today's Date: 09/29/2021  PCP: Dorise Hiss, PA-C REFERRING PROVIDER: Nita Sells, MD  END OF SESSION:   PT End of Session - 09/29/21 1349     Visit Number 7    Number of Visits 17    Date for PT Re-Evaluation 10/27/21    Authorization Type [employer]    PT Start Time 1400    PT Stop Time 1432    PT Time Calculation (min) 32 min    Equipment Utilized During Treatment Oxygen    Activity Tolerance Patient limited by fatigue    Behavior During Therapy [facility] for tasks assessed/performed                Past Medical History:  Diagnosis Date   Asthma    BPH (benign prostatic hyperplasia)    Cardiomyopathy ([place]) 06/2017   EF 40-45%   CHF (congestive heart failure) (HCC)    Chronic renal insufficiency, stage 3 (moderate) (HCC)    COPD (chronic obstructive pulmonary disease) ([place])    Diastolic dysfunction [id number]   grade 2    Hyperlipidemia    Hypertension    Hypertensive urgency    Obesity    Obesity    OSA (obstructive sleep apnea)    Paroxysmal atrial fibrillation ([place]) 2022   on Eliquis   History reviewed. No pertinent surgical history. Patient Active Problem List   Diagnosis Date Noted   Acute hypoxemic respiratory failure ([place]) 08/14/2021   Acute encephalopathy    Chronic diastolic heart failure (HCC)    Chronic obstructive pulmonary disease with acute exacerbation (HCC)    Hyperkalemia    Class 3 obesity ([place]) 07/18/2021   Acute on chronic diastolic heart failure (HCC) 07/17/2021   BPH (benign prostatic hyperplasia)    Hyperlipidemia    Acute on chronic respiratory failure with hypoxia and hypercapnia (HCC) 06/23/2021   Chronic diastolic CHF (congestive heart failure) ([place]) 06/23/2021   COPD with acute exacerbation ([place]) 06/23/2021   Acute on chronic diastolic (congestive) heart failure ([place]) 04/30/2021    AKI (acute kidney injury) ([place]) 04/29/2021   Acute exacerbation of CHF (congestive heart failure) ([place]) 04/29/2021   Chronic respiratory failure with hypercapnia ([place]) 04/29/2021   Bilateral hydrocele 04/29/2021   Dyspnea on exertion 04/29/2021   Acute respiratory failure with hypoxia ([place]) 03/10/2021   Urticaria 01/15/2021   Chronic heart failure with preserved ejection fraction (HCC)    Angioedema 01/04/2021   Influenza vaccine refused 06/26/2020   COVID-19 vaccine series completed 06/26/2020   Paroxysmal atrial fibrillation ([place]) 07/02/2019   Secondary hypercoagulable state ([place]) 07/02/2019   OSA (obstructive sleep apnea) 04/03/2019   History of angioedema due to ACE INHIBITORS 10/13/2017   Non compliance w medication regimen 07/13/2017   Essential hypertension 06/23/2017   Acute kidney injury superimposed on chronic kidney disease ([place])    Cardiomyopathy ([place]) 06/06/2017   Dyspnea 06/05/2017    REFERRING DIAG: Unsteadiness on feet (R26.81);Other abnormalities of gait and mobility (R26.89);Muscle weakness (generalized) (M62.81)  THERAPY DIAG: Muscle weakness (generalized)   Difficulty in walking, not elsewhere classified   Other abnormalities of gait and mobility   Unsteadiness on feet   PERTINENT HISTORY: COPD with chronic hypoxemic respiratory failure, diastolic heart failure, HTN, paroxysmal fibrillation, CKD, morbid obesity  PRECAUTIONS: Fall, respiratory  SUBJECTIVE:  Pt presents to PT with no current pain, notes that he has decreased the amount of recent swelling  and weight gain. Has not been as compliant as he would like due fatigue. He is ready to begin PT at this time.   PAIN:  Are you having pain?  No : NPRS scale: 0/10 currently (10/10 in morning) Pain location: low back Pain description: ache  Aggravating factors: activity Relieving factors: movement    OBJECTIVE: (objective measures completed at initial evaluation unless otherwise dated)   VITALS:  (09/29/2021) - pre session           SpO2: 94% on 3L  HR: 78bpm  BP L UE sitting 123/58   DIAGNOSTIC FINDINGS:            N/A   LE MMT:   MMT Right 09/02/2021 Left 09/02/2021  Hip flexion  3+/5 3+/5  Hip extension      Hip abduction 3+/5 3+/5  Hip adduction 3+/5 3+/5  Hip external rotation      Hip internal rotation      Knee extension 4/5 4/5  Knee flexion 4/5 4/5  Ankle dorsiflexion       Ankle plantarflexion      Ankle inversion      Ankle eversion      Grossly      (Blank rows = not tested)   FUNCTIONAL TESTS:  30 Second Sit to Stand: 4 reps 2MWT: 266ft   GAIT: Distance walked: 239ft Assistive device utilized: Single point cane and supplemental O2 Level of assistance: CGA Comments: very dyspneic with longer distance, decreased gait speed    TODAY'S TREATMENT: [facility] Adult PT Treatment:                                                DATE: 09/29/2021 Therapeutic Exercise: Nustep 4 min L4 (O2 sats pre 95%, 92% post) STS with OH reach 2x5 from table Seated row 2x15 black TB Standing hip extension x 10 Seated hamstring stretch x 30" R Seated calf stretch x 30" R  [facility] Adult PT Treatment:                                                DATE: 09/24/2021 Therapeutic Exercise: Nustep 7 min L1 (O2 sats pre 96%, 89% post) Self Care: Close monitoring of Vss during session as well as discussion regarding weight gain and need to monitor and notify MD if outside acceptable parameters.  [place] Adult PT Treatment:                                                DATE: 09/22/2021 Therapeutic Exercise: Nustep 3 min L3 (O2 sats pre 95%, 92% post) STS with OH reach 2x5 from table Seated horizontal abd 2x15 BTB Standing hip abd in // x 10 each Standing marches x 20 Standing hip ext x 10 each in // UBE lvl 1.0 x 3 min   PATIENT EDUCATION:  Education details: continue HEP Person educated: Patient Education method: Explanation, Demonstration, and Handouts Education comprehension:  verbalized understanding and returned demonstration     HOME EXERCISE PROGRAM: Access Code: [email or web address] URL: [email or web address] Date: 09/29/2021 Prepared by: Octavio Manns  Exercises -  Sit to Stand  - 2-3 x daily - 7 x weekly - 2 sets - 10 reps - Standing Hip Abduction with Counter Support  - 2-3 x daily - 7 x weekly - 2 sets - 10 reps - Standing Hip Extension with Counter Support  - 1 x daily - 7 x weekly - 3 sets - 10 reps - Standing Shoulder Row with Anchored Resistance  - 1 x daily - 7 x weekly - 3 sets - 10 reps - Seated Calf Stretch with Strap  - 1 x daily - 7 x weekly - 2-3 reps - 20--30 sec hold - Seated Hamstring Stretch  - 1 x daily - 7 x weekly - 2-3 reps - 20-30 sec hold   ASSESSMENT:   CLINICAL IMPRESSION: Pt was able to complete prescribed exercises with continued need for good sized rest break and monitoring of vitals. His SpO2 stayed in range of 89-98% on 3L during session, with slight decrease in recovery time noted by therapist. He continues to require skilled PT services working on improving strength and activity tolerance and will continue to be seen and progressed as able.    OBJECTIVE IMPAIRMENTS decreased activity tolerance, decreased balance, decreased endurance, decreased mobility, difficulty walking, decreased ROM, decreased strength, and pain.    ACTIVITY LIMITATIONS cleaning, community activity, driving, laundry, and yard work.    PERSONAL FACTORS Fitness, Time since onset of injury/illness/exacerbation, and 3+ comorbidities: COPD with chronic hypoxemic respiratory failure, diastolic heart failure, HTN, paroxysmal fibrillation, CKD, morbid obesity  are also affecting patient's functional outcome.    GOALS: Goals reviewed with patient? No   SHORT TERM GOALS: Target date: 09/23/2021   Pt will be compliant and knowledgeable with initial HEP for improved comfort and carryover Baseline: initial HEP given  Goal status: INITIAL   2.  Pt will be  able to stand >37min for improved activity tolerance and ADL performance Baseline: 1-2 min Goal status: INITIAL   LONG TERM GOALS: Target date: 10/27/2021   Pt will improve reps in 30 Second Sit to Stand to no less than 7 for improved balance and functional mobility Baseline: 4 reps Goal status: INITIAL   2.  Pt will be able to ambulate 452ft during 2MWT for improved functional endurance and safety with community navigation Baseline: 246ft Goal status: INITIAL   3.  Pt will be able to perform >10 min of standing activity for improved activity tolerance and functional ability to perform ADLs Baseline: 1-2 min Goal status: INITIAL   4.  Pt will increase hip MMT to no less than 4/5 for all tested motions for improved strength and functional mobility Baseline: see chart  Goal status: INITIAL   PLAN: PT FREQUENCY: 2x/week   PT DURATION: 8 weeks   PLANNED INTERVENTIONS: Therapeutic exercises, Therapeutic activity, Neuromuscular re-education, Balance training, Gait training, Patient/Family education, Joint mobilization, Aquatic Therapy, Electrical stimulation, Cryotherapy, Moist heat, and Manual therapy   PLAN FOR NEXT SESSION: assess exercise response, progress HEP, monitor Vss, encourage energy conservation and proper breathing techniques, begin to assess goals progress, f/u on fatigue, elevated HR and weight gain.   Check all possible CPT codes: [id number] - Re-evaluation, 97110- Therapeutic Exercise, [phone]- Neuro Re-education, [phone] - Gait Training, [phone] - Manual Therapy, 97530 - Therapeutic Activities, and 97535 - Self Care  If treatment provided at initial evaluation, no treatment charged due to lack of authorization.                         Ward Chatters, PT 09/29/2021, 2:40 PM

## 2021-10-01 ENCOUNTER — Ambulatory Visit: Payer: Medicaid Other

## 2021-10-01 DIAGNOSIS — R2689 Other abnormalities of gait and mobility: Secondary | ICD-10-CM

## 2021-10-01 DIAGNOSIS — R2681 Unsteadiness on feet: Secondary | ICD-10-CM

## 2021-10-01 DIAGNOSIS — R262 Difficulty in walking, not elsewhere classified: Secondary | ICD-10-CM

## 2021-10-01 DIAGNOSIS — M6281 Muscle weakness (generalized): Secondary | ICD-10-CM | POA: Diagnosis not present

## 2021-10-01 NOTE — Therapy (Addendum)
OUTPATIENT PHYSICAL THERAPY TREATMENT NOTE/DISCHARGE  PHYSICAL THERAPY DISCHARGE SUMMARY  Visits from Start of Care: 8  Current functional level related to goals / functional outcomes: Unable to assess   Remaining deficits: Unable to assess   Education / Equipment: N/A   Patient agrees to discharge. Patient goals were  unable to assess . Patient is being discharged due to not returning since the last visit.   Patient Name: Zachary Hawkins MRN: [id number] DOB:[birth year], 66 y.o., male Today's Date: 10/01/2021  PCP: Dorise Hiss, PA-C REFERRING PROVIDER: Nita Sells, MD  END OF SESSION:   PT End of Session - 10/01/21 1352     Visit Number 8    Number of Visits 17    Date for PT Re-Evaluation 10/27/21    Authorization Type [employer]    PT Start Time 1355    PT Stop Time 1430    PT Time Calculation (min) 35 min    Equipment Utilized During Treatment Oxygen    Activity Tolerance Patient limited by fatigue    Behavior During Therapy [facility] for tasks assessed/performed                 Past Medical History:  Diagnosis Date   Asthma    BPH (benign prostatic hyperplasia)    Cardiomyopathy ([place]) 06/2017   EF 40-45%   CHF (congestive heart failure) (HCC)    Chronic renal insufficiency, stage 3 (moderate) (HCC)    COPD (chronic obstructive pulmonary disease) ([place])    Diastolic dysfunction 45/8099   grade 2    Hyperlipidemia    Hypertension    Hypertensive urgency    Obesity    Obesity    OSA (obstructive sleep apnea)    Paroxysmal atrial fibrillation ([place]) 2022   on Eliquis   History reviewed. No pertinent surgical history. Patient Active Problem List   Diagnosis Date Noted   Acute hypoxemic respiratory failure ([place]) 08/14/2021   Acute encephalopathy    Chronic diastolic heart failure (HCC)    Chronic obstructive pulmonary disease with acute exacerbation (HCC)    Hyperkalemia    Class 3 obesity ([place]) 07/18/2021   Acute on  chronic diastolic heart failure (HCC) 07/17/2021   BPH (benign prostatic hyperplasia)    Hyperlipidemia    Acute on chronic respiratory failure with hypoxia and hypercapnia (HCC) 06/23/2021   Chronic diastolic CHF (congestive heart failure) ([place]) 06/23/2021   COPD with acute exacerbation ([place]) 06/23/2021   Acute on chronic diastolic (congestive) heart failure ([place]) 04/30/2021   AKI (acute kidney injury) ([place]) 04/29/2021   Acute exacerbation of CHF (congestive heart failure) ([place]) 04/29/2021   Chronic respiratory failure with hypercapnia ([place]) 04/29/2021   Bilateral hydrocele 04/29/2021   Dyspnea on exertion 04/29/2021   Acute respiratory failure with hypoxia ([place]) 03/10/2021   Urticaria 01/15/2021   Chronic heart failure with preserved ejection fraction (HCC)    Angioedema 01/04/2021   Influenza vaccine refused 06/26/2020   COVID-19 vaccine series completed 06/26/2020   Paroxysmal atrial fibrillation ([place]) 07/02/2019   Secondary hypercoagulable state ([place]) 07/02/2019   OSA (obstructive sleep apnea) 04/03/2019   History of angioedema due to ACE INHIBITORS 10/13/2017   Non compliance w medication regimen 07/13/2017   Essential hypertension 06/23/2017   Acute kidney injury superimposed on chronic kidney disease ([place])    Cardiomyopathy ([place]) 06/06/2017   Dyspnea 06/05/2017    REFERRING DIAG: Unsteadiness on feet (R26.81);Other abnormalities of gait and mobility (R26.89);Muscle weakness (generalized) (M62.81)  THERAPY DIAG: Muscle weakness (generalized)  Difficulty in walking, not elsewhere classified   Other abnormalities of gait and mobility   Unsteadiness on feet   PERTINENT HISTORY: COPD with chronic hypoxemic respiratory failure, diastolic heart failure, HTN, paroxysmal fibrillation, CKD, morbid obesity  PRECAUTIONS: Fall, respiratory  SUBJECTIVE:  Pt presents to PT noting that he is very fatigued as he had trouble finding his phone before his appointment. He has been  compliant with his HEP with no adverse effect. Pt is ready to begin PT at this time.   PAIN:  Are you having pain?  No : NPRS scale: 0/10 currently (10/10 in morning) Pain location: low back Pain description: ache  Aggravating factors: activity Relieving factors: movement    OBJECTIVE: (objective measures completed at initial evaluation unless otherwise dated)   VITALS: (10/01/2021) - during session           SpO2: 92%-99% on 3L  HR: 88-118bpm  BP L UE sitting 113/79   DIAGNOSTIC FINDINGS:            N/A   LE MMT:   MMT Right 09/02/2021 Left 09/02/2021  Hip flexion  3+/5 3+/5  Hip extension      Hip abduction 3+/5 3+/5  Hip adduction 3+/5 3+/5  Hip external rotation      Hip internal rotation      Knee extension 4/5 4/5  Knee flexion 4/5 4/5  Ankle dorsiflexion       Ankle plantarflexion      Ankle inversion      Ankle eversion      Grossly      (Blank rows = not tested)   FUNCTIONAL TESTS:  30 Second Sit to Stand: 5 reps - 10/01/2021 2MWT: 280f - 10/01/2021 (no change)   GAIT: Distance walked: 2539fAssistive device utilized: Single point cane and supplemental O2 Level of assistance: CGA Comments: very dyspneic with longer distance, decreased gait speed    TODAY'S TREATMENT: [facility] Adult PT Treatment:                                                DATE: 10/01/2021 Therapeutic Exercise: STS with OH reach 2x5 from table Seated row 2x15 black TB Seated horizontal abd 2x15 GTB LAQ 2x10 4# Standing hip abd/extension x 10 Seated bicep curl 2x20 7# each  Therapeutic Activity: Assessment of tests/measures, goals, and outcomes  [facility] Adult PT Treatment:                                                DATE: 09/29/2021 Therapeutic Exercise: Nustep 4 min L4 (O2 sats pre 95%, 92% post) STS with OH reach 2x5 from table Seated row 2x15 black TB Standing hip extension x 10 Seated hamstring stretch x 30" R Seated calf stretch x 30" R  [facility] Adult PT Treatment:                                                 DATE: 09/24/2021 Therapeutic Exercise: Nustep 7 min L1 (O2 sats pre 96%, 89% post) Self Care: Close monitoring of Vss during session as well as discussion  regarding weight gain and need to monitor and notify MD if outside acceptable parameters.   PATIENT EDUCATION:  Education details: continue HEP Person educated: Patient Education method: Explanation, Demonstration, and Handouts Education comprehension: verbalized understanding and returned demonstration     HOME EXERCISE PROGRAM: Access Code: [email or web address] URL: [email or web address] Date: 09/29/2021 Prepared by: Octavio Manns  Exercises - Sit to Stand  - 2-3 x daily - 7 x weekly - 2 sets - 10 reps - Standing Hip Abduction with Counter Support  - 2-3 x daily - 7 x weekly - 2 sets - 10 reps - Standing Hip Extension with Counter Support  - 1 x daily - 7 x weekly - 3 sets - 10 reps - Standing Shoulder Row with Anchored Resistance  - 1 x daily - 7 x weekly - 3 sets - 10 reps - Seated Calf Stretch with Strap  - 1 x daily - 7 x weekly - 2-3 reps - 20--30 sec hold - Seated Hamstring Stretch  - 1 x daily - 7 x weekly - 2-3 reps - 20-30 sec hold   ASSESSMENT:   CLINICAL IMPRESSION: Pt was able to complete prescribed exercises with no adverse effect on continued supplemental O2. Therapy focused on improving functional mobility and endurance while monitoring vitals. His HR and SpO2 continue to recover more quickly compared to initial evaluation. His 30 Second Sit to Stand did improve by one, showing slight increase in functional mobility. His ambulation tolerance and distance is relatively the same, showing he would continue to benefit from skilled PT services working on improving strength and functional activity tolerance. Will continue to progress as able per POC.   OBJECTIVE IMPAIRMENTS decreased activity tolerance, decreased balance, decreased endurance, decreased mobility, difficulty walking,  decreased ROM, decreased strength, and pain.    ACTIVITY LIMITATIONS cleaning, community activity, driving, laundry, and yard work.    PERSONAL FACTORS Fitness, Time since onset of injury/illness/exacerbation, and 3+ comorbidities: COPD with chronic hypoxemic respiratory failure, diastolic heart failure, HTN, paroxysmal fibrillation, CKD, morbid obesity  are also affecting patient's functional outcome.    GOALS: Goals reviewed with patient? No   SHORT TERM GOALS: Target date: 09/23/2021   Pt will be compliant and knowledgeable with initial HEP for improved comfort and carryover Baseline: initial HEP given  Goal status: MET   2.  Pt will be able to stand >60mn for improved activity tolerance and ADL performance Baseline: 1-2 min Goal status: ONGOING   LONG TERM GOALS: Target date: 10/27/2021   Pt will improve reps in 30 Second Sit to Stand to no less than 7 for improved balance and functional mobility Baseline: 4 reps 10/01/2021: 5 reps Goal status: ONGOING   2.  Pt will be able to ambulate 4065fduring 2MWT for improved functional endurance and safety with community navigation Baseline: 21067f/25/2023: 212f31fal status: ONGOING   3.  Pt will be able to perform >10 min of standing activity for improved activity tolerance and functional ability to perform ADLs Baseline: 1-2 min Goal status: ONGOING   4.  Pt will increase hip MMT to no less than 4/5 for all tested motions for improved strength and functional mobility Baseline: see chart  Goal status: ONGOING   PLAN: PT FREQUENCY: 2x/week   PT DURATION: 6 weeks   PLANNED INTERVENTIONS: Therapeutic exercises, Therapeutic activity, Neuromuscular re-education, Balance training, Gait training, Patient/Family education, Joint mobilization, Aquatic Therapy, Electrical stimulation, Cryotherapy, Moist heat, and Manual therapy   PLAN FOR NEXT SESSION: assess exercise response, progress  HEP, monitor Vss, encourage energy conservation  and proper breathing techniques, begin to assess goals progress, f/u on fatigue, elevated HR and weight gain.   Check all possible CPT codes: 19166 - Re-evaluation, 97110- Therapeutic Exercise, [phone]- Neuro Re-education, [phone] - Gait Training, [phone] - Manual Therapy, 97530 - Therapeutic Activities, and 97535 - Self Care                                    If treatment provided at initial evaluation, no treatment charged due to lack of authorization.                        Ward Chatters, PT 10/01/2021, 3:28 PM

## 2021-10-02 ENCOUNTER — Ambulatory Visit: Payer: Medicaid Other | Admitting: Dietician

## 2021-10-06 ENCOUNTER — Emergency Department (HOSPITAL_BASED_OUTPATIENT_CLINIC_OR_DEPARTMENT_OTHER): Payer: Medicaid Other | Admitting: Radiology

## 2021-10-06 ENCOUNTER — Inpatient Hospital Stay (HOSPITAL_BASED_OUTPATIENT_CLINIC_OR_DEPARTMENT_OTHER)
Admission: EM | Admit: 2021-10-06 | Discharge: 2021-10-11 | DRG: 641 | Disposition: A | Discharge status: Home / Self Care | Payer: Medicaid Other | Attending: Internal Medicine | Admitting: Internal Medicine

## 2021-10-06 ENCOUNTER — Other Ambulatory Visit: Payer: Self-pay

## 2021-10-06 ENCOUNTER — Emergency Department (HOSPITAL_BASED_OUTPATIENT_CLINIC_OR_DEPARTMENT_OTHER): Payer: Medicaid Other

## 2021-10-06 ENCOUNTER — Encounter (HOSPITAL_BASED_OUTPATIENT_CLINIC_OR_DEPARTMENT_OTHER): Payer: Self-pay | Admitting: Obstetrics and Gynecology

## 2021-10-06 DIAGNOSIS — N400 Enlarged prostate without lower urinary tract symptoms: Secondary | ICD-10-CM | POA: Diagnosis present

## 2021-10-06 DIAGNOSIS — N183 Chronic kidney disease, stage 3 unspecified: Secondary | ICD-10-CM | POA: Diagnosis present

## 2021-10-06 DIAGNOSIS — Z22322 Carrier or suspected carrier of Methicillin resistant Staphylococcus aureus: Secondary | ICD-10-CM

## 2021-10-06 DIAGNOSIS — I429 Cardiomyopathy, unspecified: Secondary | ICD-10-CM | POA: Diagnosis not present

## 2021-10-06 DIAGNOSIS — N179 Acute kidney failure, unspecified: Secondary | ICD-10-CM | POA: Diagnosis present

## 2021-10-06 DIAGNOSIS — Z87891 Personal history of nicotine dependence: Secondary | ICD-10-CM

## 2021-10-06 DIAGNOSIS — G4733 Obstructive sleep apnea (adult) (pediatric): Secondary | ICD-10-CM | POA: Diagnosis not present

## 2021-10-06 DIAGNOSIS — E860 Dehydration: Secondary | ICD-10-CM | POA: Diagnosis not present

## 2021-10-06 DIAGNOSIS — T502X5A Adverse effect of carbonic-anhydrase inhibitors, benzothiadiazides and other diuretics, initial encounter: Secondary | ICD-10-CM | POA: Diagnosis present

## 2021-10-06 DIAGNOSIS — E876 Hypokalemia: Secondary | ICD-10-CM | POA: Diagnosis present

## 2021-10-06 DIAGNOSIS — I5032 Chronic diastolic (congestive) heart failure: Secondary | ICD-10-CM | POA: Diagnosis present

## 2021-10-06 DIAGNOSIS — Z6841 Body Mass Index (BMI) 40.0 and over, adult: Secondary | ICD-10-CM

## 2021-10-06 DIAGNOSIS — Z20822 Contact with and (suspected) exposure to covid-19: Secondary | ICD-10-CM | POA: Diagnosis not present

## 2021-10-06 DIAGNOSIS — I480 Paroxysmal atrial fibrillation: Secondary | ICD-10-CM | POA: Diagnosis not present

## 2021-10-06 DIAGNOSIS — J9612 Chronic respiratory failure with hypercapnia: Secondary | ICD-10-CM | POA: Diagnosis not present

## 2021-10-06 DIAGNOSIS — J449 Chronic obstructive pulmonary disease, unspecified: Secondary | ICD-10-CM | POA: Diagnosis not present

## 2021-10-06 DIAGNOSIS — E785 Hyperlipidemia, unspecified: Secondary | ICD-10-CM | POA: Diagnosis not present

## 2021-10-06 DIAGNOSIS — Z9981 Dependence on supplemental oxygen: Secondary | ICD-10-CM

## 2021-10-06 DIAGNOSIS — N189 Chronic kidney disease, unspecified: Secondary | ICD-10-CM | POA: Diagnosis present

## 2021-10-06 DIAGNOSIS — Z7901 Long term (current) use of anticoagulants: Secondary | ICD-10-CM

## 2021-10-06 DIAGNOSIS — R6510 Systemic inflammatory response syndrome (SIRS) of non-infectious origin without acute organ dysfunction: Secondary | ICD-10-CM | POA: Diagnosis present

## 2021-10-06 DIAGNOSIS — Z9102 Food additives allergy status: Secondary | ICD-10-CM

## 2021-10-06 DIAGNOSIS — I10 Essential (primary) hypertension: Secondary | ICD-10-CM | POA: Diagnosis present

## 2021-10-06 DIAGNOSIS — Z91018 Allergy to other foods: Secondary | ICD-10-CM

## 2021-10-06 DIAGNOSIS — K5900 Constipation, unspecified: Secondary | ICD-10-CM | POA: Diagnosis present

## 2021-10-06 DIAGNOSIS — Z7951 Long term (current) use of inhaled steroids: Secondary | ICD-10-CM

## 2021-10-06 DIAGNOSIS — Z79899 Other long term (current) drug therapy: Secondary | ICD-10-CM

## 2021-10-06 DIAGNOSIS — Z888 Allergy status to other drugs, medicaments and biological substances status: Secondary | ICD-10-CM

## 2021-10-06 DIAGNOSIS — I130 Hypertensive heart and chronic kidney disease with heart failure and stage 1 through stage 4 chronic kidney disease, or unspecified chronic kidney disease: Secondary | ICD-10-CM | POA: Diagnosis not present

## 2021-10-06 LAB — COMPREHENSIVE METABOLIC PANEL
ALT: 15 U/L (ref 0–44)
AST: 18 U/L (ref 15–41)
Albumin: 4.2 g/dL (ref 3.5–5.0)
Alkaline Phosphatase: 65 U/L (ref 38–126)
Anion gap: 15 (ref 5–15)
BUN: 84 mg/dL — ABNORMAL HIGH (ref 8–23)
CO2: 43 mmol/L — ABNORMAL HIGH (ref 22–32)
Calcium: 9.8 mg/dL (ref 8.9–10.3)
Chloride: 74 mmol/L — ABNORMAL LOW (ref 98–111)
Creatinine, Ser: 3.53 mg/dL — ABNORMAL HIGH (ref 0.61–1.24)
GFR, Estimated: 19 mL/min — ABNORMAL LOW (ref >60)
Glucose, Bld: 172 mg/dL — ABNORMAL HIGH (ref 70–99)
Potassium: 3 mmol/L — ABNORMAL LOW (ref 3.5–5.1)
Sodium: 132 mmol/L — ABNORMAL LOW (ref 135–145)
Total Bilirubin: 0.6 mg/dL (ref 0.3–1.2)
Total Protein: 7.9 g/dL (ref 6.5–8.1)

## 2021-10-06 LAB — CBC WITH DIFFERENTIAL/PLATELET
Abs Immature Granulocytes: 0.08 10*3/uL — ABNORMAL HIGH (ref 0.00–0.07)
Basophils Absolute: 0 10*3/uL (ref 0.0–0.1)
Basophils Relative: 1 %
Eosinophils Absolute: 0 10*3/uL (ref 0.0–0.5)
Eosinophils Relative: 1 %
HCT: 44.9 % (ref 39.0–52.0)
Hemoglobin: 13.9 g/dL (ref 13.0–17.0)
Immature Granulocytes: 1 %
Lymphocytes Relative: 16 %
Lymphs Abs: 1 10*3/uL (ref 0.7–4.0)
MCH: 28 pg (ref 26.0–34.0)
MCHC: 31 g/dL (ref 30.0–36.0)
MCV: 90.5 fL (ref 80.0–100.0)
Monocytes Absolute: 1.2 10*3/uL — ABNORMAL HIGH (ref 0.1–1.0)
Monocytes Relative: 18 %
Neutro Abs: 4.2 10*3/uL (ref 1.7–7.7)
Neutrophils Relative %: 63 %
Platelets: 251 10*3/uL (ref 150–400)
RBC: 4.96 MIL/uL (ref 4.22–5.81)
RDW: 15.2 % (ref 11.5–15.5)
WBC: 6.5 10*3/uL (ref 4.0–10.5)
nRBC: 0 % (ref 0.0–0.2)

## 2021-10-06 LAB — URINALYSIS, ROUTINE W REFLEX MICROSCOPIC
Bilirubin Urine: NEGATIVE
Glucose, UA: 250 mg/dL — AB
Hgb urine dipstick: NEGATIVE
Ketones, ur: NEGATIVE mg/dL
Leukocytes,Ua: NEGATIVE
Nitrite: NEGATIVE
Protein, ur: NEGATIVE mg/dL
Specific Gravity, Urine: 1.011 (ref 1.005–1.030)
pH: 6 (ref 5.0–8.0)

## 2021-10-06 LAB — RESP PANEL BY RT-PCR (FLU A&B, COVID) ARPGX2
Influenza A by PCR: NEGATIVE
Influenza B by PCR: NEGATIVE
SARS Coronavirus 2 by RT PCR: NEGATIVE

## 2021-10-06 LAB — BRAIN NATRIURETIC PEPTIDE: B Natriuretic Peptide: 8.5 pg/mL (ref 0.0–100.0)

## 2021-10-06 MED ORDER — ACETAMINOPHEN 325 MG PO TABS: 650.0000 mg | ORAL_TABLET | Freq: Four times a day (QID) | ORAL | Status: DC | PRN

## 2021-10-06 MED ORDER — ONDANSETRON HCL 4 MG PO TABS: 4.0000 mg | ORAL_TABLET | Freq: Four times a day (QID) | ORAL | Status: DC | PRN

## 2021-10-06 MED ORDER — POTASSIUM CHLORIDE CRYS ER 20 MEQ PO TBCR
40.0000 meq | EXTENDED_RELEASE_TABLET | Freq: Once | ORAL | Status: AC
  Administered 2021-10-06: 40 meq via ORAL
  Filled 2021-10-06: qty 2

## 2021-10-06 MED ORDER — LACTATED RINGERS IV BOLUS
500.0000 mL | Freq: Once | INTRAVENOUS | Status: AC
  Administered 2021-10-06: 500 mL via INTRAVENOUS

## 2021-10-06 MED ORDER — LACTATED RINGERS IV SOLN: INTRAVENOUS | Status: DC

## 2021-10-06 MED ORDER — ACETAMINOPHEN 650 MG RE SUPP: 650.0000 mg | Freq: Four times a day (QID) | RECTAL | Status: DC | PRN

## 2021-10-06 MED ORDER — ONDANSETRON HCL 4 MG/2ML IJ SOLN: 4.0000 mg | Freq: Four times a day (QID) | INTRAMUSCULAR | Status: DC | PRN

## 2021-10-06 NOTE — Assessment & Plan Note (Signed)
BIPAP QHS

## 2021-10-06 NOTE — ED Triage Notes (Signed)
Patient reports to the ER for constipation. Patient reports this has been going on x3 days. Patient reports he is also a little short of breath. Patient is on 3L at baseline.

## 2021-10-06 NOTE — Assessment & Plan Note (Signed)
BPs a bit on the soft side today. 1. Hold home BP meds except BB (for A.Fib rate control).

## 2021-10-06 NOTE — H&P (Signed)
History and Physical    Patient: Zachary Hawkins [id number] DOB: August 06, 1955 DOA: 10/06/2021 DOS: the patient was seen and examined on 10/06/2021 PCP: McVey, Gelene Mink, PA-C  Patient coming from: Home  Chief Complaint:  Chief Complaint  Patient presents with   Shortness of Breath   Constipation   HPI: Zachary Hawkins is a 65 y.o. 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  dCHF, CKD 3, HTN, HLD.  A.Fib on eliquis.  Pt recently admitted in early April for acute on chronic hypercapnic failure due to opiate OD (unintentional), as well as CHF exacerbation.  Looks like torsemide dosing was added / increased following that admit.  Torsemide dosing increased further to 100mg  daily earlier this month it appears.  Pt presents to ED with c/o constipation, SOB, abd pain.  SOB worse laying down.  No dysuria.  No nausea or vomiting.  Has not been around anyone sick.  States he cannot lay as flat as he used to.  Today pt endorses dry mouth, generalized itching.  Feels dehydrated.  Notes his wt was ~325lbs x2 days ago.  Wt 154.7kg (341 lbs) in ED; however, 147kg on bed scale here at [facility], and 146kg (321 lbs) when we stood him up and weighed him during my exam at [facility] (still had tele box on too).    Review of Systems: As mentioned in the history of present illness. All other systems reviewed and are negative. Past Medical History:  Diagnosis Date   Asthma    BPH (benign prostatic hyperplasia)    Cardiomyopathy ([place]) 06/2017   EF 40-45%   CHF (congestive heart failure) (HCC)    Chronic renal insufficiency, stage 3 (moderate) (HCC)    COPD (chronic obstructive pulmonary disease) (HCC)    Diastolic dysfunction [id number]   grade 2    Hyperlipidemia    Hypertension    Hypertensive urgency    Obesity    Obesity    OSA (obstructive sleep apnea)    Paroxysmal atrial fibrillation ([place]) 2022   on Eliquis   History reviewed. No  pertinent surgical history. Social History:  reports that he quit smoking about 8 months ago. His smoking use included cigarettes. He has a 36.00 pack-year smoking history. He has never used smokeless tobacco. He reports current alcohol use of about 1.0 standard drink per week. He reports current drug use. Drug: Marijuana.  Allergies  Allergen Reactions   Ace Inhibitors Anaphylaxis   Black Cherry Fruit Extract Marcelline Mates Extract] Anaphylaxis    Tongue   Grenadine Flavor [Flavoring Agent] Anaphylaxis    Family History  Problem Relation Age of Onset   Diabetes Mother    Diabetes Father     Prior to Admission medications   Medication Sig Start Date End Date Taking? Authorizing Provider  ADVAIR HFA 115-21 MCG/ACT inhaler INHALE 2 PUFFS INTO THE LUNGS 2 (TWO) TIMES DAILY. 07/23/20   Charlott Rakes, MD  albuterol (VENTOLIN HFA) 108 (90 Base) MCG/ACT inhaler Inhale 2 puffs into the lungs every 4 (four) hours as needed for wheezing or shortness of breath. 02/13/21   Croitoru, Mihai, MD  amLODipine (NORVASC) 5 MG tablet Take 1 tablet (5 mg total) by mouth daily. 05/04/21 08/15/21  Darliss Cheney, MD  apixaban (ELIQUIS) 5 MG TABS tablet Take 1 tablet (5 mg total) by mouth 2 (two) times daily. 05/15/21 11/11/21  Loel Dubonnet, NP  atorvastatin (LIPITOR) 10 MG tablet Take 10 mg by mouth daily.  [provider]  bisoprolol (ZEBETA) 5 MG tablet Take 1 tablet (5 mg total) by mouth daily. 03/16/21   Pokhrel, Laxman, MD  budesonide (PULMICORT) 0.25 MG/2ML nebulizer solution Take 2 mLs (0.25 mg total) by nebulization 2 (two) times daily. 08/21/21   Nita Sells, MD  dapagliflozin propanediol (FARXIGA) 10 MG TABS tablet Take 1 tablet (10 mg total) by mouth daily. 07/23/21   Domenic Polite, MD  doxazosin (CARDURA) 2 MG tablet Take 1 tablet (2 mg total) by mouth daily. 08/22/21   Nita Sells, MD  EPINEPHrine 0.3 mg/0.3 mL IJ SOAJ injection Inject 0.3 mg into the muscle as needed for  anaphylaxis. Patient taking differently: Inject 0.3 mg into the muscle once as needed for anaphylaxis. 01/09/21   Bonnielee Haff, MD  famotidine (PEPCID) 20 MG tablet Take 1 tablet (20 mg total) by mouth 2 (two) times daily. 01/17/21 01/17/22  Julian Hy, DO  fluticasone (FLONASE) 50 MCG/ACT nasal spray Place 1 spray into both nostrils daily. 07/09/21   Maryjane Hurter, MD  Multiple Vitamin (MULTIVITAMIN WITH MINERALS) TABS tablet Take 1 tablet by mouth daily. 01/10/21   Bonnielee Haff, MD  pantoprazole (PROTONIX) 40 MG tablet Take 1 tablet (40 mg total) by mouth daily. 08/22/21   Nita Sells, MD  saline (AYR) GEL Place 1 application into both nostrils at bedtime. 07/09/21   Maryjane Hurter, MD  spironolactone (ALDACTONE) 25 MG tablet Take 1 tablet (25 mg total) by mouth daily. 04/07/21   Croitoru, Mihai, MD  tamsulosin (FLOMAX) 0.4 MG CAPS capsule Take 1 capsule (0.4 mg total) by mouth daily after supper. 03/16/21   Pokhrel, Corrie Mckusick, MD  torsemide (DEMADEX) 100 MG tablet Take 1 tablet (100 mg total) by mouth daily. 09/17/21   Maryjane Hurter, MD    Physical Exam: Vitals:   10/06/21 1930 10/06/21 2016 10/06/21 2119 10/06/21 2250  BP:   107/70 95/68  Pulse: 97 87 84 100  Resp: (!) 26 (!) 24 20 20   Temp:   100 F (37.8 C) 99.1 F (37.3 C)  TempSrc:   Oral Oral  SpO2: 97% 100% 97% 90%  Weight:      Height:       Constitutional: NAD, calm, comfortable Eyes: PERRL, lids and conjunctivae normal ENMT: Mucous membranes are Dry. Posterior pharynx clear of any exudate or lesions.Normal dentition.  Neck: normal, supple, no masses, no thyromegaly Respiratory: clear to auscultation bilaterally, no wheezing, no crackles. Normal respiratory effort. No accessory muscle use.  Cardiovascular: Regular rate and rhythm, no murmurs / rubs / gallops. No extremity edema. 2+ pedal pulses. No carotid bruits.  Abdomen: no tenderness, no masses palpated. No hepatosplenomegaly. Bowel sounds positive.   Musculoskeletal: no clubbing / cyanosis. No joint deformity upper and lower extremities. Good ROM, no contractures. Normal muscle tone.  Skin: no rashes, lesions, ulcers. No induration Neurologic: CN 2-12 grossly intact. Sensation intact, DTR normal. Strength 5/5 in all 4.  Psychiatric: Normal judgment and insight. Alert and oriented x 3. Normal mood.   Data Reviewed:       Latest Ref Rng & Units 10/06/2021    6:18 PM 08/21/2021    3:40 AM 08/20/2021    1:07 AM  BMP  Glucose 70 - 99 mg/dL 172   94   126    BUN 8 - 23 mg/dL 84   44   48    Creatinine 0.61 - 1.24 mg/dL 3.53   1.60   1.63    Sodium 135 -  145 mmol/L 132   138   138    Potassium 3.5 - 5.1 mmol/L 3.0   3.5   4.4    Chloride 98 - 111 mmol/L 74   95   96    CO2 22 - 32 mmol/L 43   38   35    Calcium 8.9 - 10.3 mg/dL 9.8   8.6   8.8     CBC    Component Value Date/Time   WBC 6.5 10/06/2021 1818   RBC 4.96 10/06/2021 1818   HGB 13.9 10/06/2021 1818   HGB 16.7 07/11/2020 1103   HCT 44.9 10/06/2021 1818   HCT 50.3 07/11/2020 1103   PLT 251 10/06/2021 1818   PLT 232 07/11/2020 1103   MCV 90.5 10/06/2021 1818   MCV 88 07/11/2020 1103   MCH 28.0 10/06/2021 1818   MCHC 31.0 10/06/2021 1818   RDW 15.2 10/06/2021 1818   RDW 13.6 07/11/2020 1103   LYMPHSABS 1.0 10/06/2021 1818   MONOABS 1.2 (H) 10/06/2021 1818   EOSABS 0.0 10/06/2021 1818   BASOSABS 0.0 10/06/2021 1818   Urinalysis    Component Value Date/Time   COLORURINE YELLOW 10/06/2021 1857   APPEARANCEUR CLEAR 10/06/2021 1857   LABSPEC 1.011 10/06/2021 1857   PHURINE 6.0 10/06/2021 1857   GLUCOSEU 250 (A) 10/06/2021 1857   HGBUR NEGATIVE 10/06/2021 1857   BILIRUBINUR NEGATIVE 10/06/2021 1857   KETONESUR NEGATIVE 10/06/2021 1857   PROTEINUR NEGATIVE 10/06/2021 1857   NITRITE NEGATIVE 10/06/2021 1857   LEUKOCYTESUR NEGATIVE 10/06/2021 1857    CT AP = IMPRESSION: No CT findings to account for the patient's abdominal pain.   CXR + KUB = IMPRESSION: 1.  Substantially improved aeration in the right lung with only some bandlike atelectasis or scarring at the right lung base remaining. 2.  Aortic Atherosclerosis (ICD10-I70.0). 3. Thoracolumbar spondylosis. 4. No specific bowel gas abnormality is identified although much of the small bowel is gasless. No significant amount of colonic stool noted.    Assessment and Plan: * Acute kidney injury superimposed on chronic kidney disease ([place]) Think that dehydration (Overdiuresis) is cause of todays AKI on CKD. Suspect recent increases in demedex may be responsible (looks like demedex dose increased after early April admit, then increased again earlier this month) Looks like baseline creat ~ 1.5 Pt dry wt = 325lbs following last admit, wt today on bed scale at [facility] = 147 kg, wt on standing scale at bedside (stood pt up and weighed him during my exam) = 146kg (still had tele box on too).  This is consistent with where his wt has been in recent days (was 320-325 x2 days ago he says). Believe that the 154.7kg obtained in [facility] to be an error. Note also the 20:1 BUN:Cr ratio c/w pre-renal pattern. Hold diuretics LR 500cc bolus in ED and 125 cc/hr overnight Strict intake and output No obstruction demonstrated on CT AP today Repeat BMP in AM  SIRS (systemic inflammatory response syndrome) (HCC) Fever and tachycardia on presentation to ED today.  No source as of yet. Tylenol PRN fever Check procalcitonin Repeat CBC in AM BCx pending Negative or WNL Source workup thus far: UA COVID and flu CXR CT AP  ARF (acute renal failure) (HCC)-resolved as of 10/06/2021    Chronic diastolic heart failure (HCC) Hold diuretics given AKI. Continue farxiga  Chronic respiratory failure with hypercapnia (HCC) From COPD + OSA / HVOS On BIPAP at night per DC summary from April.  Says  he has been using this more since then. Cont home nebs for COPD Acute on chronic increase in bicarb on todays BMP is  likely due to contraction alkalosis from loop diuretics.  Paroxysmal atrial fibrillation (HCC) Continue eliquis Cont bisoprolol  Class 3 obesity (HCC) Dry wt on discharge from April admit = 325lbs 321lbs today (and still wearing tele box), ? Dehydration causing pre-renal AKI.  Hyperlipidemia Continue statin  BPH (benign prostatic hyperplasia) Cont flomax  OSA (obstructive sleep apnea) BIPAP QHS  Essential hypertension BPs a bit on the soft side today. Hold home BP meds except BB (for A.Fib rate control).      Advance Care Planning:   Code Status: Full Code  Consults: None  Family Communication: No family in room  Severity of Illness: The appropriate patient status for this patient is OBSERVATION. Observation status is judged to be reasonable and necessary in order to provide the required intensity of service to ensure the patient's safety. The patient's presenting symptoms, physical exam findings, and initial radiographic and laboratory data in the context of their medical condition is felt to place them at decreased risk for further clinical deterioration. Furthermore, it is anticipated that the patient will be medically stable for discharge from the hospital within 2 midnights of admission.   Author: Etta Quill., DO 10/06/2021 11:09 PM  For on call review [email or web address].

## 2021-10-06 NOTE — ED Notes (Signed)
[facility] called for hospitalist consult. Spoke with Tammy.

## 2021-10-06 NOTE — Assessment & Plan Note (Addendum)
Think that dehydration (Overdiuresis) is cause of todays AKI on CKD. Suspect recent increases in demedex may be responsible (looks like demedex dose increased after early April admit, then increased again earlier this month) Looks like baseline creat ~ 1.5 Pt dry wt = 325lbs following last admit, wt today on bed scale at [facility] = 147 kg, wt on standing scale at bedside (stood pt up and weighed him during my exam) = 146kg (still had tele box on too).  This is consistent with where his wt has been in recent days (was 320-325 x2 days ago he says). Believe that the 154.7kg obtained in ED [facility] to be an error. Note also the 20:1 BUN:Cr ratio c/w pre-renal pattern. 1. Hold diuretics 2. LR 500cc bolus in ED and 125 cc/hr overnight 3. Strict intake and output 4. No obstruction demonstrated on CT AP today 5. Repeat BMP in AM

## 2021-10-06 NOTE — Assessment & Plan Note (Signed)
Continue eliquis Cont bisoprolol

## 2021-10-06 NOTE — Assessment & Plan Note (Signed)
Fever and tachycardia on presentation to ED today.  No source as of yet. 1. Tylenol PRN fever 2. Check procalcitonin 3. Repeat CBC in AM 4. BCx pending 5. Negative or WNL Source workup thus far: 1. UA 2. COVID and flu 3. CXR 4. CT AP

## 2021-10-06 NOTE — Assessment & Plan Note (Signed)
Continue statin. 

## 2021-10-06 NOTE — Assessment & Plan Note (Signed)
Hold diuretics given AKI. Continue farxiga

## 2021-10-06 NOTE — Assessment & Plan Note (Addendum)
Dry wt on discharge from April admit = 325lbs 321lbs today (and still wearing tele box), ? Dehydration causing pre-renal AKI.

## 2021-10-06 NOTE — Assessment & Plan Note (Signed)
Cont flomax   

## 2021-10-06 NOTE — Assessment & Plan Note (Addendum)
From COPD + OSA / HVOS On BIPAP at night per DC summary from April.  Says he has been using this more since then. Cont home nebs for COPD Acute on chronic increase in bicarb on todays BMP is likely due to contraction alkalosis from loop diuretics.

## 2021-10-06 NOTE — ED Provider Notes (Signed)
[facility] EMERGENCY DEPT Provider Note   CSN: [id number] Arrival date & time: 10/06/21  1752     History  Chief Complaint  Patient presents with   Shortness of Breath   Constipation    Zachary Hawkins is a 66 y.o. male.   Shortness of Breath Constipation Patient presents with constipation.  States he has not had a bowel movement in 3 days.  States that is on usual form.  States he goes once or twice a day usually.  No known fevers.  States pain is dull.  Also feeling more short of breath.  History of chronic respiratory failure and is on oxygen at 3 L at baseline.  No dysuria.  No nausea or vomiting.  Has not been around anyone sick.  States he cannot lay as flat as he used to.  Reviewing records from recent admission it appears that patient had a new dry weight of 325.  Had been previous dry weight about 350 pounds.  Today is about 340 pounds.    Home Medications Prior to Admission medications   Medication Sig Start Date End Date Taking? Authorizing Provider  ADVAIR HFA 115-21 MCG/ACT inhaler INHALE 2 PUFFS INTO THE LUNGS 2 (TWO) TIMES DAILY. 07/23/20   Charlott Rakes, MD  albuterol (VENTOLIN HFA) 108 (90 Base) MCG/ACT inhaler Inhale 2 puffs into the lungs every 4 (four) hours as needed for wheezing or shortness of breath. 02/13/21   Croitoru, Mihai, MD  amLODipine (NORVASC) 5 MG tablet Take 1 tablet (5 mg total) by mouth daily. 05/04/21 08/15/21  Darliss Cheney, MD  apixaban (ELIQUIS) 5 MG TABS tablet Take 1 tablet (5 mg total) by mouth 2 (two) times daily. 05/15/21 11/11/21  Loel Dubonnet, NP  atorvastatin (LIPITOR) 10 MG tablet Take 10 mg by mouth daily.    [provider]  bisoprolol (ZEBETA) 5 MG tablet Take 1 tablet (5 mg total) by mouth daily. 03/16/21   Pokhrel, Laxman, MD  budesonide (PULMICORT) 0.25 MG/2ML nebulizer solution Take 2 mLs (0.25 mg total) by nebulization 2 (two) times daily. 08/21/21   Nita Sells, MD  dapagliflozin propanediol  (FARXIGA) 10 MG TABS tablet Take 1 tablet (10 mg total) by mouth daily. 07/23/21   Domenic Polite, MD  doxazosin (CARDURA) 2 MG tablet Take 1 tablet (2 mg total) by mouth daily. 08/22/21   Nita Sells, MD  EPINEPHrine 0.3 mg/0.3 mL IJ SOAJ injection Inject 0.3 mg into the muscle as needed for anaphylaxis. Patient taking differently: Inject 0.3 mg into the muscle once as needed for anaphylaxis. 01/09/21   Bonnielee Haff, MD  famotidine (PEPCID) 20 MG tablet Take 1 tablet (20 mg total) by mouth 2 (two) times daily. 01/17/21 01/17/22  Julian Hy, DO  fluticasone (FLONASE) 50 MCG/ACT nasal spray Place 1 spray into both nostrils daily. 07/09/21   Maryjane Hurter, MD  Multiple Vitamin (MULTIVITAMIN WITH MINERALS) TABS tablet Take 1 tablet by mouth daily. 01/10/21   Bonnielee Haff, MD  pantoprazole (PROTONIX) 40 MG tablet Take 1 tablet (40 mg total) by mouth daily. 08/22/21   Nita Sells, MD  saline (AYR) GEL Place 1 application into both nostrils at bedtime. 07/09/21   Maryjane Hurter, MD  spironolactone (ALDACTONE) 25 MG tablet Take 1 tablet (25 mg total) by mouth daily. 04/07/21   Croitoru, Mihai, MD  tamsulosin (FLOMAX) 0.4 MG CAPS capsule Take 1 capsule (0.4 mg total) by mouth daily after supper. 03/16/21   Pokhrel, Corrie Mckusick, MD  torsemide (DEMADEX) 100  MG tablet Take 1 tablet (100 mg total) by mouth daily. 09/17/21   Omar PersonMeier, Nathaniel M, MD      Allergies    Ace inhibitors, Black cherry fruit extract Valentino Saxon[cherry extract], and [place] flavor [flavoring agent]    Review of Systems   Review of Systems  Respiratory:  Positive for shortness of breath.   Gastrointestinal:  Positive for constipation.   Physical Exam Updated Vital Signs BP 107/69   Pulse 87   Temp (!) 100.9 F (38.3 C) (Axillary)   Resp (!) 24   Ht 6\' 1"  (1.854 m)   Wt (!) 154.7 kg   SpO2 100%   BMI 44.99 kg/m  Physical Exam Vitals and nursing note reviewed.  HENT:     Head: Atraumatic.  Cardiovascular:      Rate and Rhythm: Normal rate and regular rhythm.  Pulmonary:     Comments: Somewhat decreased breath sounds but somewhat difficult due to body habitus. Chest:     Chest wall: No tenderness.  Abdominal:     Tenderness: There is abdominal tenderness.     Comments: Mild diffuse tenderness.  May have some inferior fullness.  Musculoskeletal:     Right lower leg: Edema present.     Left lower leg: Edema present.     Comments: Mild edema bilateral lower extremities.  Neurological:     Mental Status: He is alert and oriented to person, place, and time.    ED Results / Procedures / Treatments   Labs (all labs ordered are listed, but only abnormal results are displayed) Labs Reviewed  COMPREHENSIVE METABOLIC PANEL - Abnormal; Notable for the following components:      Result Value   Sodium 132 (*)    Potassium 3.0 (*)    Chloride 74 (*)    CO2 43 (*)    Glucose, Bld 172 (*)    BUN 84 (*)    Creatinine, Ser 3.53 (*)    GFR, Estimated 19 (*)    All other components within normal limits  CBC WITH DIFFERENTIAL/PLATELET - Abnormal; Notable for the following components:   Monocytes Absolute 1.2 (*)    Abs Immature Granulocytes 0.08 (*)    All other components within normal limits  URINALYSIS, ROUTINE W REFLEX MICROSCOPIC - Abnormal; Notable for the following components:   Glucose, UA 250 (*)    All other components within normal limits  RESP PANEL BY RT-PCR (FLU A&B, COVID) [id number]  BRAIN NATRIURETIC PEPTIDE    EKG EKG Interpretation  Date/Time:  Tuesday Oct 06 2021 18:02:43 EDT Ventricular Rate:  105 PR Interval:  140 QRS Duration: 105 QT Interval:  369 QTC Calculation: 488 R Axis:   24 Text Interpretation: Sinus tachycardia Atrial premature complex Repol abnrm suggests ischemia, lateral leads Nonspecific anterior ST changes. Confirmed by Benjiman CorePickering, Alberto Schoch [phone]) on 10/06/2021 6:15:19 PM  Radiology CT ABDOMEN PELVIS WO CONTRAST  Result Date: 10/06/2021 CLINICAL DATA:   Abdominal pain, constipation EXAM: CT ABDOMEN AND PELVIS WITHOUT CONTRAST TECHNIQUE: Multidetector CT imaging of the abdomen and pelvis was performed following the standard protocol without IV contrast. RADIATION DOSE REDUCTION: This exam was performed according to the departmental dose-optimization program which includes automated exposure control, adjustment of the mA and/or kV according to patient size and/or use of iterative reconstruction technique. COMPARISON:  04/29/2021 FINDINGS: Lower chest: Eventration of the right hemidiaphragm with associated right basilar atelectasis. Hepatobiliary: Unenhanced liver is unremarkable. Gallbladder is unremarkable. No intrahepatic or extrahepatic duct dilatation. Pancreas: Within normal limits. Spleen: Within  normal limits. Adrenals/Urinary Tract: Adrenal glands are within normal limits. Bilateral renal cysts, measuring up to 2.9 cm in the left upper kidney (series 2/image 39). No renal calculi or hydronephrosis. Bladder is underdistended but unremarkable. Stomach/Bowel: Stomach is within normal limits. No evidence of bowel obstruction. Normal appendix (series 2/image 68). Mild sigmoid diverticulosis, without evidence of diverticulitis. Vascular/Lymphatic: No evidence of abdominal aortic aneurysm. Atherosclerotic calcifications of the abdominal aorta and branch vessels. No suspicious abdominopelvic lymphadenopathy. Reproductive: Prostate is notable for dystrophic calcifications. Other: No abdominopelvic ascites. Small fat containing left inguinal hernia (series 2/image 94). Small fat containing periumbilical hernia (series 2/image 25). Musculoskeletal: Degenerative changes of the visualized thoracolumbar spine. IMPRESSION: No CT findings to account for the patient's abdominal pain. Additional ancillary findings as above. Electronically Signed   By: Julian Hy M.D.   On: 10/06/2021 20:09   DG Abdomen Acute W/Chest  Result Date: 10/06/2021 CLINICAL DATA:  Shortness  of breath and constipation. Abdominal pain. EXAM: DG ABDOMEN ACUTE WITH 1 VIEW CHEST COMPARISON:  08/17/2021 FINDINGS: Previous endotracheal and nasogastric tubes are no longer present. Improved aeration in the right lung with some remaining volume loss and bandlike density at the right lung base. Thoracic spondylosis. Atherosclerotic calcification of the aortic arch. Scattered gas in the colon.  The small bowel is mostly gasless. Thoracolumbar spondylosis noted. IMPRESSION: 1. Substantially improved aeration in the right lung with only some bandlike atelectasis or scarring at the right lung base remaining. 2.  Aortic Atherosclerosis (ICD10-I70.0). 3. Thoracolumbar spondylosis. 4. No specific bowel gas abnormality is identified although much of the small bowel is gasless. No significant amount of colonic stool noted. Electronically Signed   By: Van Clines M.D.   On: 10/06/2021 19:31    Procedures Procedures    Medications Ordered in ED Medications  lactated ringers bolus 500 mL (has no administration in time range)    ED Course/ Medical Decision Making/ A&P                           Medical Decision Making Amount and/or Complexity of Data Reviewed Labs: ordered. Radiology: ordered.   Patient presents with constipation.  Shortness of breath.  History of chronic oxygen use.  States he has been more short of breath laying down.  Found to have low-grade temperature.  Unclear source but did have abdominal tenderness.  CT scan done and reassuring.  White count also reassuring.  Normal BNP.  Chest x-ray does not show pneumonia or CHF.  CT scan does not show cause of the pain or fever.  However creatinine is gone up to 3.5 1 is most recently 1.6 at discharge.  Weight is 240 pounds now.  Down from 61 with most recent pulmonary visit for potential new dry weight of 325.  However with worsening kidney function normal BNP and chest x-ray with no pneumonia potentially is volume down affecting his  kidney function.  With acute kidney injury worsening shortness of breath will admit to hospital.  No clear cause of pain or fever.  Negative COVID test.  Will discuss with hospitalist.        Final Clinical Impression(s) / ED Diagnoses Final diagnoses:  AKI (acute kidney injury) [facility])    Rx / [place] Orders ED Discharge Orders     None         Davonna Belling, MD 10/06/21 2042

## 2021-10-06 NOTE — ED Notes (Signed)
[facility] arrived to transport pt. Pt stable at time of departure ?

## 2021-10-07 DIAGNOSIS — I5032 Chronic diastolic (congestive) heart failure: Secondary | ICD-10-CM | POA: Diagnosis present

## 2021-10-07 DIAGNOSIS — G4733 Obstructive sleep apnea (adult) (pediatric): Secondary | ICD-10-CM | POA: Diagnosis present

## 2021-10-07 DIAGNOSIS — Z9102 Food additives allergy status: Secondary | ICD-10-CM | POA: Diagnosis not present

## 2021-10-07 DIAGNOSIS — Z79899 Other long term (current) drug therapy: Secondary | ICD-10-CM | POA: Diagnosis not present

## 2021-10-07 DIAGNOSIS — N183 Chronic kidney disease, stage 3 unspecified: Secondary | ICD-10-CM | POA: Diagnosis present

## 2021-10-07 DIAGNOSIS — J449 Chronic obstructive pulmonary disease, unspecified: Secondary | ICD-10-CM | POA: Diagnosis present

## 2021-10-07 DIAGNOSIS — Z6841 Body Mass Index (BMI) 40.0 and over, adult: Secondary | ICD-10-CM | POA: Diagnosis not present

## 2021-10-07 DIAGNOSIS — R6510 Systemic inflammatory response syndrome (SIRS) of non-infectious origin without acute organ dysfunction: Secondary | ICD-10-CM | POA: Diagnosis present

## 2021-10-07 DIAGNOSIS — N189 Chronic kidney disease, unspecified: Secondary | ICD-10-CM | POA: Diagnosis not present

## 2021-10-07 DIAGNOSIS — Z7951 Long term (current) use of inhaled steroids: Secondary | ICD-10-CM | POA: Diagnosis not present

## 2021-10-07 DIAGNOSIS — Z9981 Dependence on supplemental oxygen: Secondary | ICD-10-CM | POA: Diagnosis not present

## 2021-10-07 DIAGNOSIS — Z20822 Contact with and (suspected) exposure to covid-19: Secondary | ICD-10-CM | POA: Diagnosis present

## 2021-10-07 DIAGNOSIS — N400 Enlarged prostate without lower urinary tract symptoms: Secondary | ICD-10-CM | POA: Diagnosis present

## 2021-10-07 DIAGNOSIS — K5900 Constipation, unspecified: Secondary | ICD-10-CM | POA: Diagnosis present

## 2021-10-07 DIAGNOSIS — E860 Dehydration: Secondary | ICD-10-CM | POA: Diagnosis not present

## 2021-10-07 DIAGNOSIS — Z888 Allergy status to other drugs, medicaments and biological substances status: Secondary | ICD-10-CM | POA: Diagnosis not present

## 2021-10-07 DIAGNOSIS — E876 Hypokalemia: Secondary | ICD-10-CM | POA: Diagnosis present

## 2021-10-07 DIAGNOSIS — N179 Acute kidney failure, unspecified: Secondary | ICD-10-CM | POA: Diagnosis present

## 2021-10-07 DIAGNOSIS — I480 Paroxysmal atrial fibrillation: Secondary | ICD-10-CM | POA: Diagnosis present

## 2021-10-07 DIAGNOSIS — I130 Hypertensive heart and chronic kidney disease with heart failure and stage 1 through stage 4 chronic kidney disease, or unspecified chronic kidney disease: Secondary | ICD-10-CM | POA: Diagnosis present

## 2021-10-07 DIAGNOSIS — I429 Cardiomyopathy, unspecified: Secondary | ICD-10-CM | POA: Diagnosis present

## 2021-10-07 DIAGNOSIS — Z7901 Long term (current) use of anticoagulants: Secondary | ICD-10-CM | POA: Diagnosis not present

## 2021-10-07 DIAGNOSIS — J9612 Chronic respiratory failure with hypercapnia: Secondary | ICD-10-CM | POA: Diagnosis present

## 2021-10-07 DIAGNOSIS — E785 Hyperlipidemia, unspecified: Secondary | ICD-10-CM | POA: Diagnosis present

## 2021-10-07 LAB — BASIC METABOLIC PANEL
Anion gap: 17 — ABNORMAL HIGH (ref 5–15)
Anion gap: 14 (ref 5–15)
BUN: 79 mg/dL — ABNORMAL HIGH (ref 8–23)
BUN: 80 mg/dL — ABNORMAL HIGH (ref 8–23)
CO2: 42 mmol/L — ABNORMAL HIGH (ref 22–32)
CO2: 42 mmol/L — ABNORMAL HIGH (ref 22–32)
Calcium: 9 mg/dL (ref 8.9–10.3)
Calcium: 9.5 mg/dL (ref 8.9–10.3)
Chloride: 74 mmol/L — ABNORMAL LOW (ref 98–111)
Chloride: 80 mmol/L — ABNORMAL LOW (ref 98–111)
Creatinine, Ser: 3.06 mg/dL — ABNORMAL HIGH (ref 0.61–1.24)
Creatinine, Ser: 2.98 mg/dL — ABNORMAL HIGH (ref 0.61–1.24)
GFR, Estimated: 22 mL/min — ABNORMAL LOW (ref >60)
GFR, Estimated: 23 mL/min — ABNORMAL LOW (ref >60)
Glucose, Bld: 144 mg/dL — ABNORMAL HIGH (ref 70–99)
Glucose, Bld: 130 mg/dL — ABNORMAL HIGH (ref 70–99)
Potassium: 2.6 mmol/L — CL (ref 3.5–5.1)
Potassium: 3.1 mmol/L — ABNORMAL LOW (ref 3.5–5.1)
Sodium: 133 mmol/L — ABNORMAL LOW (ref 135–145)
Sodium: 136 mmol/L (ref 135–145)

## 2021-10-07 LAB — CBC
HCT: 43.7 % (ref 39.0–52.0)
Hemoglobin: 14 g/dL (ref 13.0–17.0)
MCH: 29.2 pg (ref 26.0–34.0)
MCHC: 32 g/dL (ref 30.0–36.0)
MCV: 91.2 fL (ref 80.0–100.0)
Platelets: 253 10*3/uL (ref 150–400)
RBC: 4.79 MIL/uL (ref 4.22–5.81)
RDW: 15.3 % (ref 11.5–15.5)
WBC: 5.5 10*3/uL (ref 4.0–10.5)
nRBC: 0 % (ref 0.0–0.2)

## 2021-10-07 LAB — PROCALCITONIN
Procalcitonin: 0.3 ng/mL
Procalcitonin: 0.18 ng/mL

## 2021-10-07 LAB — MAGNESIUM: Magnesium: 2.6 mg/dL — ABNORMAL HIGH (ref 1.7–2.4)

## 2021-10-07 LAB — MRSA NEXT GEN BY PCR, NASAL: MRSA by PCR Next Gen: DETECTED — AB

## 2021-10-07 MED ORDER — BISACODYL 5 MG PO TBEC
10.0000 mg | DELAYED_RELEASE_TABLET | Freq: Once | ORAL | Status: AC
  Administered 2021-10-07: 10 mg via ORAL
  Filled 2021-10-07: qty 2

## 2021-10-07 MED ORDER — MOMETASONE FURO-FORMOTEROL FUM 200-5 MCG/ACT IN AERO
2.0000 | INHALATION_SPRAY | Freq: Two times a day (BID) | RESPIRATORY_TRACT | Status: DC
  Administered 2021-10-07 – 2021-10-11 (×8): 2 via RESPIRATORY_TRACT
  Filled 2021-10-07: qty 8.8

## 2021-10-07 MED ORDER — APIXABAN 5 MG PO TABS
5.0000 mg | ORAL_TABLET | Freq: Two times a day (BID) | ORAL | Status: DC
  Administered 2021-10-07 – 2021-10-11 (×10): 5 mg via ORAL
  Filled 2021-10-07 (×10): qty 1

## 2021-10-07 MED ORDER — TAMSULOSIN HCL 0.4 MG PO CAPS
0.4000 mg | ORAL_CAPSULE | Freq: Every day | ORAL | Status: DC
Start: 2021-10-07 — End: 2021-10-11
  Administered 2021-10-07 – 2021-10-10 (×4): 0.4 mg via ORAL
  Filled 2021-10-07 (×4): qty 1

## 2021-10-07 MED ORDER — BISOPROLOL FUMARATE 5 MG PO TABS
5.0000 mg | ORAL_TABLET | Freq: Every day | ORAL | Status: DC
  Administered 2021-10-07 – 2021-10-11 (×5): 5 mg via ORAL
  Filled 2021-10-07 (×5): qty 1

## 2021-10-07 MED ORDER — DOXAZOSIN MESYLATE 2 MG PO TABS
2.0000 mg | ORAL_TABLET | Freq: Every day | ORAL | Status: DC
  Administered 2021-10-07 – 2021-10-11 (×5): 2 mg via ORAL
  Filled 2021-10-07 (×5): qty 1

## 2021-10-07 MED ORDER — BISACODYL 5 MG PO TBEC
5.0000 mg | DELAYED_RELEASE_TABLET | Freq: Every day | ORAL | Status: DC | PRN
Start: 2021-10-07 — End: 2021-10-07
  Administered 2021-10-07: 5 mg via ORAL
  Filled 2021-10-07: qty 1

## 2021-10-07 MED ORDER — POTASSIUM CHLORIDE CRYS ER 20 MEQ PO TBCR
40.0000 meq | EXTENDED_RELEASE_TABLET | Freq: Every day | ORAL | Status: DC
Start: 2021-10-07 — End: 2021-10-07

## 2021-10-07 MED ORDER — POTASSIUM CHLORIDE CRYS ER 20 MEQ PO TBCR
40.0000 meq | EXTENDED_RELEASE_TABLET | Freq: Two times a day (BID) | ORAL | Status: AC
Start: 2021-10-07 — End: 2021-10-07
  Administered 2021-10-07 (×2): 40 meq via ORAL
  Filled 2021-10-07 (×2): qty 2

## 2021-10-07 MED ORDER — ALBUTEROL SULFATE (2.5 MG/3ML) 0.083% IN NEBU: 3.0000 mL | INHALATION_SOLUTION | RESPIRATORY_TRACT | Status: DC | PRN

## 2021-10-07 MED ORDER — MOMETASONE FURO-FORMOTEROL FUM 200-5 MCG/ACT IN AERO
2.0000 | INHALATION_SPRAY | Freq: Two times a day (BID) | RESPIRATORY_TRACT | Status: DC
  Filled 2021-10-07: qty 8.8

## 2021-10-07 MED ORDER — POTASSIUM CHLORIDE CRYS ER 20 MEQ PO TBCR
40.0000 meq | EXTENDED_RELEASE_TABLET | Freq: Once | ORAL | Status: AC
  Administered 2021-10-07: 40 meq via ORAL
  Filled 2021-10-07: qty 2

## 2021-10-07 MED ORDER — IPRATROPIUM-ALBUTEROL 0.5-2.5 (3) MG/3ML IN SOLN
3.0000 mL | Freq: Two times a day (BID) | RESPIRATORY_TRACT | Status: DC
  Administered 2021-10-07 – 2021-10-11 (×9): 3 mL via RESPIRATORY_TRACT
  Filled 2021-10-07 (×9): qty 3

## 2021-10-07 MED ORDER — POLYETHYLENE GLYCOL 3350 17 G PO PACK
17.0000 g | PACK | Freq: Two times a day (BID) | ORAL | Status: DC
  Administered 2021-10-07 – 2021-10-11 (×9): 17 g via ORAL
  Filled 2021-10-07 (×9): qty 1

## 2021-10-07 MED ORDER — PANTOPRAZOLE SODIUM 40 MG PO TBEC
40.0000 mg | DELAYED_RELEASE_TABLET | Freq: Every day | ORAL | Status: DC
  Administered 2021-10-07 – 2021-10-11 (×5): 40 mg via ORAL
  Filled 2021-10-07 (×5): qty 1

## 2021-10-07 MED ORDER — HYDROXYZINE HCL 25 MG PO TABS
25.0000 mg | ORAL_TABLET | Freq: Three times a day (TID) | ORAL | Status: DC | PRN
Start: 2021-10-07 — End: 2021-10-11
  Administered 2021-10-07: 25 mg via ORAL
  Filled 2021-10-07: qty 1

## 2021-10-07 MED ORDER — FLUTICASONE PROPIONATE 50 MCG/ACT NA SUSP
1.0000 | Freq: Every day | NASAL | Status: DC
  Administered 2021-10-07 – 2021-10-11 (×5): 1 via NASAL
  Filled 2021-10-07: qty 16

## 2021-10-07 MED ORDER — DAPAGLIFLOZIN PROPANEDIOL 10 MG PO TABS
10.0000 mg | ORAL_TABLET | Freq: Every day | ORAL | Status: DC
  Filled 2021-10-07: qty 1

## 2021-10-07 MED ORDER — POLYETHYLENE GLYCOL 3350 17 G PO PACK: 17.0000 g | PACK | Freq: Every day | ORAL | Status: DC

## 2021-10-07 MED ORDER — BUDESONIDE 0.25 MG/2ML IN SUSP
0.2500 mg | Freq: Two times a day (BID) | RESPIRATORY_TRACT | Status: DC
  Administered 2021-10-07 – 2021-10-08 (×4): 0.25 mg via RESPIRATORY_TRACT
  Filled 2021-10-07 (×4): qty 2

## 2021-10-07 MED ORDER — BISACODYL 10 MG RE SUPP
10.0000 mg | Freq: Every day | RECTAL | Status: DC | PRN
  Administered 2021-10-08: 10 mg via RECTAL
  Filled 2021-10-07: qty 1

## 2021-10-07 MED ORDER — CAMPHOR-MENTHOL 0.5-0.5 % EX LOTN
TOPICAL_LOTION | CUTANEOUS | Status: DC | PRN
  Filled 2021-10-07: qty 222

## 2021-10-07 MED ORDER — BUDESONIDE 0.25 MG/2ML IN SUSP
0.2500 mg | Freq: Two times a day (BID) | RESPIRATORY_TRACT | Status: DC
Start: 2021-10-07 — End: 2021-10-07

## 2021-10-07 MED ORDER — ATORVASTATIN CALCIUM 10 MG PO TABS
10.0000 mg | ORAL_TABLET | Freq: Every day | ORAL | Status: DC
  Administered 2021-10-07 – 2021-10-11 (×5): 10 mg via ORAL
  Filled 2021-10-07 (×5): qty 1

## 2021-10-07 MED ORDER — FAMOTIDINE 20 MG PO TABS
20.0000 mg | ORAL_TABLET | Freq: Two times a day (BID) | ORAL | Status: DC
  Administered 2021-10-07 – 2021-10-11 (×10): 20 mg via ORAL
  Filled 2021-10-07 (×10): qty 1

## 2021-10-07 NOTE — Progress Notes (Signed)
PROGRESS NOTE    Zachary Hawkins  [id number] DOB: 1955-07-19 DOA: 10/06/2021 PCP: Dorise Hiss, PA-C    Brief Narrative:  Zachary Hawkins is a 66 y.o. 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  dCHF, CKD 3, HTN, HLD.  A.Fib on eliquis.   Pt recently admitted in early April for acute on chronic hypercapnic failure due to opiate OD (unintentional), as well as CHF exacerbation.  Looks like torsemide dosing was added / increased following that admit.   Torsemide dosing increased further to 100mg  daily earlier this month it appears.   Pt presents to ED with c/o constipation, SOB, abd pain.    Assessment and Plan: Acute kidney injury superimposed on chronic kidney disease ([place]) -Think that dehydration (Overdiuresis) is cause of todays AKI on CKD. Suspect recent increases in demedex may be responsible (looks like demedex dose increased after early April admit, then increased again earlier this month) Looks like baseline creat ~ 1.5 Pt dry wt = 325lbs following last admit Note also the 20:1 BUN:Cr ratio c/w pre-renal pattern. -Hold diuretics -Strict intake and output -No obstruction demonstrated on CT AP today   SIRS (systemic inflammatory response syndrome) (HCC) Fever and tachycardia on presentation to ED today.  No source as of yet. -? Viral as procalcitonin low  Hypokalemia -replete aggressively   Chronic diastolic heart failure (HCC) Hold diuretics given AKI. Continue farxiga  Chronic respiratory failure with hypercapnia (HCC) From COPD + OSA / HVOS On BIPAP at night per DC summary from April.  Says he has been using this more since then. Cont home nebs for COPD  Paroxysmal atrial fibrillation (HCC) Continue eliquis Cont bisoprolol  Class 3 obesity (HCC) Estimated body mass index is 42.52 kg/m as calculated from the following:   Height as of this encounter: 6\' 1"  (1.854 m).   Weight as of this  encounter: 146.2 kg.  Dry wt on discharge from April admit = 325lbs 321lbs today (and still wearing tele box), ? Dehydration causing pre-renal AKI.  Hyperlipidemia - statin  BPH (benign prostatic hyperplasia) -flomax  OSA (obstructive sleep apnea) BIPAP QHS  Essential hypertension BPs a bit on the soft side today. -holding home meds          DVT prophylaxis:  apixaban (ELIQUIS) tablet 5 mg    Code Status: Full Code Family Communication:   Disposition Plan:  Level of care: Progressive Status is: Inpatient Remains inpatient appropriate because: resolution of abdominal pain, electrolytes     Consultants:  none  Subjective: C/o not being able to "shit"  Objective: Vitals:   10/07/21 0100 10/07/21 0319 10/07/21 0636 10/07/21 0746  BP:  (!) 159/97 105/84   Pulse:  90 84   Resp:  20 20   Temp:  98.2 F (36.8 C) 98.2 F (36.8 C)   TempSrc:  Oral Oral   SpO2:  97% 98% 97%  Weight: (!) 146.2 kg     Height:        Intake/Output Summary (Last 24 hours) at 10/07/2021 1136 Last data filed at 10/07/2021 1122 Gross per 24 hour  Intake 1399.11 ml  Output 1200 ml  Net 199.11 ml   Filed Weights   10/06/21 1807 10/07/21 0100  Weight: (!) 154.7 kg (!) 146.2 kg    Examination:   General: Appearance:    Severely obese male in no acute distress     Lungs:     Diminished, respirations unlabored  Heart:  Normal heart rate.    MS:   All extremities are intact.    Neurologic:   Awake, alert       Data Reviewed: I have personally reviewed following labs and imaging studies  CBC: Recent Labs  Lab 10/06/21 1818 10/07/21 0401  WBC 6.5 5.5  NEUTROABS 4.2  --   HGB 13.9 14.0  HCT 44.9 43.7  MCV 90.5 91.2  PLT 251 [id number]   Basic Metabolic Panel: Recent Labs  Lab 10/06/21 1818 10/07/21 0401  NA 132* 133*  K 3.0* 2.6*  CL 74* 74*  CO2 43* 42*  GLUCOSE 172* 144*  BUN 84* 79*  CREATININE 3.53* 3.06*  CALCIUM 9.8 9.0  MG  --  2.6*    GFR: Estimated Creatinine Clearance: 37.2 mL/min (A) (by C-G formula based on SCr of 3.06 mg/dL (H)). Liver Function Tests: Recent Labs  Lab 10/06/21 1818  AST 18  ALT 15  ALKPHOS 65  BILITOT 0.6  PROT 7.9  ALBUMIN 4.2   No results for input(s): LIPASE, AMYLASE in the last 168 hours. No results for input(s): AMMONIA in the last 168 hours. Coagulation Profile: No results for input(s): INR, PROTIME in the last 168 hours. Cardiac Enzymes: No results for input(s): CKTOTAL, CKMB, CKMBINDEX, TROPONINI in the last 168 hours. BNP (last 3 results) No results for input(s): PROBNP in the last 8760 hours. HbA1C: No results for input(s): HGBA1C in the last 72 hours. CBG: No results for input(s): GLUCAP in the last 168 hours. Lipid Profile: No results for input(s): CHOL, HDL, LDLCALC, TRIG, CHOLHDL, LDLDIRECT in the last 72 hours. Thyroid Function Tests: No results for input(s): TSH, T4TOTAL, FREET4, T3FREE, THYROIDAB in the last 72 hours. Anemia Panel: No results for input(s): VITAMINB12, FOLATE, FERRITIN, TIBC, IRON, RETICCTPCT in the last 72 hours. Sepsis Labs: Recent Labs  Lab 10/06/21 2352 10/07/21 0401  PROCALCITON 0.30 0.18    Recent Results (from the past 240 hour(s))  Resp Panel by RT-PCR (Flu A&B, Covid) Urine, Clean Catch     Status: None   Collection Time: 10/06/21  7:13 PM   Specimen: Urine, Clean Catch; Nasal Swab  Result Value Ref Range Status   SARS Coronavirus 2 by RT PCR NEGATIVE NEGATIVE Final    Comment: (NOTE) SARS-CoV-2 target nucleic acids are NOT DETECTED.  The SARS-CoV-2 RNA is generally detectable in upper respiratory specimens during the acute phase of infection. The lowest concentration of SARS-CoV-2 viral copies this assay can detect is 138 copies/mL. A negative result does not preclude SARS-Cov-2 infection and should not be used as the sole basis for treatment or other patient management decisions. A negative result may occur with  improper  specimen collection/handling, submission of specimen other than nasopharyngeal swab, presence of viral mutation(s) within the areas targeted by this assay, and inadequate number of viral copies(<138 copies/mL). A negative result must be combined with clinical observations, patient history, and epidemiological information. The expected result is Negative.  Fact Sheet for Patients:  EntrepreneurPulse.com.au  Fact Sheet for Healthcare Providers:  IncredibleEmployment.be  This test is no t yet approved or cleared by the [place] FDA and  has been authorized for detection and/or diagnosis of SARS-CoV-2 by FDA under an Emergency Use Authorization (EUA). This EUA will remain  in effect (meaning this test can be used) for the duration of the COVID-19 declaration under Section 564(b)(1) of the Act, 21 U.S.C.section 360bbb-3(b)(1), unless the authorization is terminated  or revoked sooner.       Influenza  A by PCR NEGATIVE NEGATIVE Final   Influenza B by PCR NEGATIVE NEGATIVE Final    Comment: (NOTE) The Xpert Xpress SARS-CoV-2/FLU/RSV plus assay is intended as an aid in the diagnosis of influenza from Nasopharyngeal swab specimens and should not be used as a sole basis for treatment. Nasal washings and aspirates are unacceptable for Xpert Xpress SARS-CoV-2/FLU/RSV testing.  Fact Sheet for Patients: EntrepreneurPulse.com.au  Fact Sheet for Healthcare Providers: IncredibleEmployment.be  This test is not yet approved or cleared by the [place] FDA and has been authorized for detection and/or diagnosis of SARS-CoV-2 by FDA under an Emergency Use Authorization (EUA). This EUA will remain in effect (meaning this test can be used) for the duration of the COVID-19 declaration under Section 564(b)(1) of the Act, 21 U.S.C. section 360bbb-3(b)(1), unless the authorization is terminated or revoked.  Performed at  [facility], [address] [place], [place] [address]   MRSA Next Gen by PCR, Nasal     Status: Abnormal   Collection Time: 10/07/21  3:20 AM   Specimen: Nasal Mucosa; Nasal Swab  Result Value Ref Range Status   MRSA by PCR Next Gen DETECTED (A) NOT DETECTED Final    Comment: (NOTE) The GeneXpert MRSA Assay (FDA approved for NASAL specimens only), is one component of a comprehensive MRSA colonization surveillance program. It is not intended to diagnose MRSA infection nor to guide or monitor treatment for MRSA infections. Test performance is not FDA approved in patients less than 45 years old. Performed at [facility], [place] [address], [place], [place] [address]          Radiology Studies: CT ABDOMEN PELVIS WO CONTRAST  Result Date: 10/06/2021 CLINICAL DATA:  Abdominal pain, constipation EXAM: CT ABDOMEN AND PELVIS WITHOUT CONTRAST TECHNIQUE: Multidetector CT imaging of the abdomen and pelvis was performed following the standard protocol without IV contrast. RADIATION DOSE REDUCTION: This exam was performed according to the departmental dose-optimization program which includes automated exposure control, adjustment of the mA and/or kV according to patient size and/or use of iterative reconstruction technique. COMPARISON:  04/29/2021 FINDINGS: Lower chest: Eventration of the right hemidiaphragm with associated right basilar atelectasis. Hepatobiliary: Unenhanced liver is unremarkable. Gallbladder is unremarkable. No intrahepatic or extrahepatic duct dilatation. Pancreas: Within normal limits. Spleen: Within normal limits. Adrenals/Urinary Tract: Adrenal glands are within normal limits. Bilateral renal cysts, measuring up to 2.9 cm in the left upper kidney (series 2/image 39). No renal calculi or hydronephrosis. Bladder is underdistended but unremarkable. Stomach/Bowel: Stomach is within normal limits. No evidence of bowel obstruction. Normal appendix  (series 2/image 68). Mild sigmoid diverticulosis, without evidence of diverticulitis. Vascular/Lymphatic: No evidence of abdominal aortic aneurysm. Atherosclerotic calcifications of the abdominal aorta and branch vessels. No suspicious abdominopelvic lymphadenopathy. Reproductive: Prostate is notable for dystrophic calcifications. Other: No abdominopelvic ascites. Small fat containing left inguinal hernia (series 2/image 94). Small fat containing periumbilical hernia (series 2/image 22). Musculoskeletal: Degenerative changes of the visualized thoracolumbar spine. IMPRESSION: No CT findings to account for the patient's abdominal pain. Additional ancillary findings as above. Electronically Signed   By: Julian Hy M.D.   On: 10/06/2021 20:09   DG Abdomen Acute W/Chest  Result Date: 10/06/2021 CLINICAL DATA:  Shortness of breath and constipation. Abdominal pain. EXAM: DG ABDOMEN ACUTE WITH 1 VIEW CHEST COMPARISON:  08/17/2021 FINDINGS: Previous endotracheal and nasogastric tubes are no longer present. Improved aeration in the right lung with some remaining volume loss and bandlike density at the right lung base. Thoracic  spondylosis. Atherosclerotic calcification of the aortic arch. Scattered gas in the colon.  The small bowel is mostly gasless. Thoracolumbar spondylosis noted. IMPRESSION: 1. Substantially improved aeration in the right lung with only some bandlike atelectasis or scarring at the right lung base remaining. 2.  Aortic Atherosclerosis (ICD10-I70.0). 3. Thoracolumbar spondylosis. 4. No specific bowel gas abnormality is identified although much of the small bowel is gasless. No significant amount of colonic stool noted. Electronically Signed   By: Van Clines M.D.   On: 10/06/2021 19:31        Scheduled Meds:  apixaban  5 mg Oral BID   atorvastatin  10 mg Oral Daily   bisoprolol  5 mg Oral Daily   budesonide  0.25 mg Nebulization BID   doxazosin  2 mg Oral Daily   famotidine  20  mg Oral BID   fluticasone  1 spray Each Nare Daily   ipratropium-albuterol  3 mL Nebulization BID   mometasone-formoterol  2 puff Inhalation BID   pantoprazole  40 mg Oral Daily   polyethylene glycol  17 g Oral BID   tamsulosin  0.4 mg Oral QPC supper   Continuous Infusions:  lactated ringers 125 mL/hr at 10/07/21 0002     LOS: 0 days    Time spent: 65 minutes spent on chart review, discussion with nursing staff, consultants, updating family and interview/physical exam; more than 50% of that time was spent in counseling and/or coordination of care.    Geradine Girt, DO [facility] Available via Epic secure chat 7am-7pm After these hours, please refer to coverage provider listed on [email or web address] 10/07/2021, 11:36 AM

## 2021-10-07 NOTE — Hospital Course (Signed)
Zachary Hawkins is a 66 y.o. 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  dCHF, CKD 3, HTN, HLD.  A.Fib on eliquis.   Pt recently admitted in early April for acute on chronic hypercapnic failure due to opiate OD (unintentional), as well as CHF exacerbation.  Looks like torsemide dosing was added / increased following that admit.   Torsemide dosing increased further to 100mg  daily earlier this month it appears.   Pt presents to ED with c/o constipation, SOB, abd pain.

## 2021-10-07 NOTE — Discharge Instructions (Signed)

## 2021-10-07 NOTE — Progress Notes (Incomplete)
Patient restless, up and down/ turning and tossing in bed, only wore BiPAP for less than an hour. C/O constipation and itching, Dr. Julian Reil notified. Will continue to assess patient.

## 2021-10-07 NOTE — TOC Initial Note (Signed)
Transition of Care [facility]) - Initial/Assessment Note    Patient Details  Name: Zachary Hawkins MRN: [id number] Date of Birth: 03/13/1956  Transition of Care [facility]) CM/SW Contact:    Golda Acre, RN Phone Number: 10/07/2021, 8:58 AM  Clinical Narrative:                  Transition of Care [facility]) Screening Note   Patient Details  Name: Zachary Hawkins Date of Birth: 04-22-1956   Transition of Care [facility]) CM/SW Contact:    Golda Acre, RN Phone Number: 10/07/2021, 8:59 AM    Transition of Care [facility]) has reviewed patient and no TOC needs have been identified at this time. We will continue to monitor patient advancement through interdisciplinary progression rounds. If new patient transition needs arise, please place a TOC consult.    Expected Discharge Plan: Home/Self Care Barriers to Discharge: No Barriers Identified   Patient Goals and CMS Choice Patient states their goals for this hospitalization and ongoing recovery are:: to return home CMS Medicare.gov Compare Post Acute Care list provided to:: Patient    Expected Discharge Plan and Services Expected Discharge Plan: Home/Self Care   Discharge Planning Services: CM Consult   Living arrangements for the past 2 months: Single Family Home                                      Prior Living Arrangements/Services Living arrangements for the past 2 months: Single Family Home Lives with:: Self Patient language and need for interpreter reviewed:: Yes Do you feel safe going back to the place where you live?: Yes            Criminal Activity/Legal Involvement Pertinent to Current Situation/Hospitalization: No - Comment as needed  Activities of Daily Living Home Assistive Devices/Equipment: Oxygen ADL Screening (condition at time of admission) Patient's cognitive ability adequate to safely complete daily activities?: Yes Is the patient deaf or have difficulty hearing?: No Does the patient  have difficulty seeing, even when wearing glasses/contacts?: No Does the patient have difficulty concentrating, remembering, or making decisions?: No Patient able to express need for assistance with ADLs?: Yes Does the patient have difficulty dressing or bathing?: No Independently performs ADLs?: Yes (appropriate for developmental age) Does the patient have difficulty walking or climbing stairs?: Yes Weakness of Legs: Both Weakness of Arms/Hands: None  Permission Sought/Granted                  Emotional Assessment Appearance:: Appears stated age     Orientation: : Oriented to Self, Oriented to Place, Oriented to  Time, Oriented to Situation Alcohol / Substance Use: Not Applicable Psych Involvement: No (comment)  Admission diagnosis:  ARF (acute renal failure) (HCC) [N17.9] AKI (acute kidney injury) (HCC) [N17.9] Patient Active Problem List   Diagnosis Date Noted   SIRS (systemic inflammatory response syndrome) (HCC) 10/06/2021   Acute hypoxemic respiratory failure (HCC) 08/14/2021   Acute encephalopathy    Chronic diastolic heart failure (HCC)    Chronic obstructive pulmonary disease with acute exacerbation (HCC)    Hyperkalemia    Class 3 obesity (HCC) 07/18/2021   Acute on chronic diastolic heart failure (HCC) 07/17/2021   BPH (benign prostatic hyperplasia)    Hyperlipidemia    Acute on chronic respiratory failure with hypoxia and hypercapnia (HCC) 06/23/2021   Chronic diastolic CHF (congestive heart failure) (HCC) 06/23/2021   COPD  with acute exacerbation (HCC) 06/23/2021   Acute on chronic diastolic (congestive) heart failure (HCC) 04/30/2021   AKI (acute kidney injury) (HCC) 04/29/2021   Acute exacerbation of CHF (congestive heart failure) (HCC) 04/29/2021   Chronic respiratory failure with hypercapnia (HCC) 04/29/2021   Bilateral hydrocele 04/29/2021   Dyspnea on exertion 04/29/2021   Acute respiratory failure with hypoxia (HCC) 03/10/2021   Urticaria  01/15/2021   Chronic heart failure with preserved ejection fraction (HCC)    Angioedema 01/04/2021   Influenza vaccine refused 06/26/2020   COVID-19 vaccine series completed 06/26/2020   Paroxysmal atrial fibrillation (HCC) 07/02/2019   Secondary hypercoagulable state (HCC) 07/02/2019   OSA (obstructive sleep apnea) 04/03/2019   History of angioedema due to ACE INHIBITORS 10/13/2017   Non compliance w medication regimen 07/13/2017   Essential hypertension 06/23/2017   Acute kidney injury superimposed on chronic kidney disease (HCC)    Cardiomyopathy (HCC) 06/06/2017   Dyspnea 06/05/2017   PCP:  Sebastian Ache, PA-C Pharmacy:   [facility] [address] [place] [address] Phone: [phone] Fax: [phone]  [facility] - [place], [place] - [address] [place] [address] Phone: [phone] Fax: [phone]     Social Determinants of Health (SDOH) Interventions    Readmission Risk Interventions    06/25/2021    3:22 PM 05/04/2021   10:03 AM 03/13/2021   12:35 PM  Readmission Risk Prevention Plan  Transportation Screening Complete Complete Complete  PCP or Specialist Appt within 3-5 Days  Complete   HRI or Home Care Consult  Complete   Social Work Consult for Recovery Care Planning/Counseling  Complete   Palliative Care Screening  Not Applicable   Medication Review [employer]) Complete Complete Complete  PCP or Specialist appointment within 3-5 days of discharge Complete  Complete  HRI or Home Care Consult Complete  Complete  SW Recovery Care/Counseling Consult Complete  Complete  Palliative Care Screening Not Applicable  Not Applicable  Skilled Nursing Facility Complete  Complete

## 2021-10-07 NOTE — Progress Notes (Signed)
   10/07/21 1252  Assess: MEWS Score  Temp 99.6 F (37.6 C)  BP 112/83  Pulse Rate 97  Resp (!) 22  Level of Consciousness Responds to Voice  SpO2 100 %  O2 Device Nasal Cannula  O2 Flow Rate (L/min) 3 L/min  Assess: MEWS Score  MEWS Temp 0  MEWS Systolic 0  MEWS Pulse 0  MEWS RR 1  MEWS LOC 1  MEWS Score 2  MEWS Score Color Yellow  Assess: if the MEWS score is Yellow or Red  Were vital signs taken at a resting state? Yes  Focused Assessment Change from prior assessment (see assessment flowsheet)  Does the patient meet 2 or more of the SIRS criteria? Yes  Does the patient have a confirmed or suspected source of infection? Yes  Provider and Rapid Response Notified?  (MD notified)  MEWS guidelines implemented *See Row Information* Yes  Treat  MEWS Interventions Escalated (See documentation below)  Pain Scale 0-10  Pain Score 0  Take Vital Signs  Increase Vital Sign Frequency  Yellow: Q 2hr X 2 then Q 4hr X 2, if remains yellow, continue Q 4hrs  Escalate  MEWS: Escalate Yellow: discuss with charge nurse/RN and consider discussing with provider and RRT  Notify: Charge Nurse/RN  Name of Charge Nurse/RN Notified Marissa Long, RN  Date Charge Nurse/RN Notified 10/07/21  Time Charge Nurse/RN Notified 1310  Notify: Provider  Provider Name/Title Marlin Canary, DO  Date Provider Notified 10/07/21  Time Provider Notified 1302  Method of Notification  (secure chat)  Notification Reason Change in status  Provider response See new orders  Date of Provider Response 10/07/21  Time of Provider Response 1302  Assess: SIRS CRITERIA  SIRS Temperature  0  SIRS Pulse 1  SIRS Respirations  1  SIRS WBC 0  SIRS Score Sum  2

## 2021-10-08 DIAGNOSIS — N189 Chronic kidney disease, unspecified: Secondary | ICD-10-CM | POA: Diagnosis not present

## 2021-10-08 DIAGNOSIS — N179 Acute kidney failure, unspecified: Secondary | ICD-10-CM | POA: Diagnosis not present

## 2021-10-08 DIAGNOSIS — N400 Enlarged prostate without lower urinary tract symptoms: Secondary | ICD-10-CM | POA: Diagnosis not present

## 2021-10-08 LAB — CBC
HCT: 43.4 % (ref 39.0–52.0)
Hemoglobin: 13.2 g/dL (ref 13.0–17.0)
MCH: 28.5 pg (ref 26.0–34.0)
MCHC: 30.4 g/dL (ref 30.0–36.0)
MCV: 93.7 fL (ref 80.0–100.0)
Platelets: 221 10*3/uL (ref 150–400)
RBC: 4.63 MIL/uL (ref 4.22–5.81)
RDW: 15.3 % (ref 11.5–15.5)
WBC: 4.9 10*3/uL (ref 4.0–10.5)
nRBC: 0 % (ref 0.0–0.2)

## 2021-10-08 LAB — BASIC METABOLIC PANEL
Anion gap: 12 (ref 5–15)
BUN: 62 mg/dL — ABNORMAL HIGH (ref 8–23)
CO2: 40 mmol/L — ABNORMAL HIGH (ref 22–32)
Calcium: 9 mg/dL (ref 8.9–10.3)
Chloride: 84 mmol/L — ABNORMAL LOW (ref 98–111)
Creatinine, Ser: 2.27 mg/dL — ABNORMAL HIGH (ref 0.61–1.24)
GFR, Estimated: 32 mL/min — ABNORMAL LOW (ref >60)
Glucose, Bld: 118 mg/dL — ABNORMAL HIGH (ref 70–99)
Potassium: 3.2 mmol/L — ABNORMAL LOW (ref 3.5–5.1)
Sodium: 136 mmol/L (ref 135–145)

## 2021-10-08 LAB — PROCALCITONIN: Procalcitonin: <0.1 ng/mL

## 2021-10-08 MED ORDER — MUPIROCIN 2 % EX OINT
1.0000 "application " | TOPICAL_OINTMENT | Freq: Two times a day (BID) | CUTANEOUS | Status: DC
  Administered 2021-10-08 – 2021-10-11 (×7): 1 via NASAL
  Filled 2021-10-08: qty 22

## 2021-10-08 MED ORDER — CHLORHEXIDINE GLUCONATE 0.12 % MT SOLN
15.0000 mL | Freq: Two times a day (BID) | OROMUCOSAL | Status: DC
  Administered 2021-10-08 – 2021-10-11 (×7): 15 mL via OROMUCOSAL
  Filled 2021-10-08 (×7): qty 15

## 2021-10-08 MED ORDER — CHLORHEXIDINE GLUCONATE CLOTH 2 % EX PADS
6.0000 | MEDICATED_PAD | Freq: Every day | CUTANEOUS | Status: DC
  Administered 2021-10-09 – 2021-10-10 (×2): 6 via TOPICAL

## 2021-10-08 MED ORDER — POTASSIUM CHLORIDE CRYS ER 20 MEQ PO TBCR
40.0000 meq | EXTENDED_RELEASE_TABLET | Freq: Once | ORAL | Status: AC
Start: 2021-10-08 — End: 2021-10-08
  Administered 2021-10-08: 40 meq via ORAL
  Filled 2021-10-08: qty 2

## 2021-10-08 MED ORDER — ORAL CARE MOUTH RINSE
15.0000 mL | Freq: Two times a day (BID) | OROMUCOSAL | Status: DC
  Administered 2021-10-08 – 2021-10-09 (×4): 15 mL via OROMUCOSAL

## 2021-10-08 MED ORDER — AMLODIPINE BESYLATE 5 MG PO TABS
5.0000 mg | ORAL_TABLET | Freq: Every day | ORAL | Status: DC
  Administered 2021-10-09: 5 mg via ORAL
  Filled 2021-10-08 (×2): qty 1

## 2021-10-08 NOTE — Progress Notes (Signed)
TED hoses placed per order.  XL regular knee-high.  Bradd Burner, RN

## 2021-10-08 NOTE — Progress Notes (Signed)
Patient has had several small, loose stools.  Reports still feeling "tight" in abdomen, and like he has not "passed anything solid" and needs to.  Scheduled Miralax and PRN bisacodyl administered.  Patient encouraged to remain OOB and ambulate in hallway (with assistance).  Bradd Burner, RN

## 2021-10-08 NOTE — Evaluation (Signed)
Physical Therapy Evaluation Patient Details Name: Zachary Hawkins MRN: [id number] DOB: [birth year] Today's Date: 10/08/2021  History of Present Illness  66 y.o. 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  dCHF, CKD 3, HTN, HLD.  A.Fib on eliquis.     Pt recently admitted in early April for acute on chronic hypercapnic failure due to opiate OD (unintentional), as well as CHF exacerbation.  Looks like torsemide dosing was added / increased following that admit. Pt presents to ED with c/o constipation, SOB, abd pain. Dx of AKI on CKD, SIRS.  Clinical Impression  Pt admitted with above diagnosis. Pt ambulated 80' x 2 with RW and 2L O2, SpO2 88-92% while walking. Pt had one seated rest break 2* fatigue.  Pt currently with functional limitations due to the deficits listed below (see PT Problem List). Pt will benefit from skilled PT to increase their independence and safety with mobility to allow discharge to the venue listed below.          Recommendations for follow up therapy are one component of a multi-disciplinary discharge planning process, led by the attending physician.  Recommendations may be updated based on patient status, additional functional criteria and insurance authorization.  Follow Up Recommendations Home health PT    Assistance Recommended at Discharge Set up Supervision/Assistance  Patient can return home with the following  A little help with bathing/dressing/bathroom;Help with stairs or ramp for entrance;Assist for transportation    Equipment Recommendations None recommended by PT  Recommendations for Other Services       Functional Status Assessment Patient has had a recent decline in their functional status and demonstrates the ability to make significant improvements in function in a reasonable and predictable amount of time.     Precautions / Restrictions Precautions Precautions: Other (comment) Precaution Comments:  on 2L O2 baseline, monitor sats Restrictions Weight Bearing Restrictions: No      Mobility  Bed Mobility Overal bed mobility: Modified Independent             General bed mobility comments: used bedrail, HOB up    Transfers Overall transfer level: Needs assistance Equipment used: Rolling walker (2 wheels) Transfers: Sit to/from Stand Sit to Stand: Supervision           General transfer comment: uses momentum for sit to stand, mild posterior lean initially upon standing    Ambulation/Gait Ambulation/Gait assistance: Supervision Gait Distance (Feet): 80 Feet (80' x 2 with seated rest break) Assistive device: Rolling walker (2 wheels) Gait Pattern/deviations: WFL(Within Functional Limits) Gait velocity: decr     General Gait Details: steady with RW, SaO2 88-92% on 2L O2 walking, 80' x 2 with seated rest break 2* fatigue  Stairs            Wheelchair Mobility    Modified Rankin (Stroke Patients Only)       Balance Overall balance assessment: Modified Independent                                           Pertinent Vitals/Pain Pain Assessment Pain Assessment: No/denies pain    Home Living Family/patient expects to be discharged to:: Private residence Living Arrangements: Alone Available Help at Discharge: Family;Friend(s);Available PRN/intermittently Type of Home: House Home Access: Stairs to enter Entrance Stairs-Rails: None Entrance Stairs-Number of Steps: 3+1   Home Layout: One level  Home Equipment: Grab bars - tub/shower;BSC/3in1;Rolling Walker (2 wheels) Additional Comments: Pt on 2-3L O2 at home    Prior Function Prior Level of Function : Independent/Modified Independent             Mobility Comments: Pt typically ambulates without assistive device. ADLs Comments: independent with ADLs, drives; has "[employer]" to help with checking vitals/medication; friends/family assist with IADLs as needed     Hand Dominance    Dominant Hand: Right    Extremity/Trunk Assessment   Upper Extremity Assessment Upper Extremity Assessment: Overall WFL for tasks assessed    Lower Extremity Assessment Lower Extremity Assessment: Overall WFL for tasks assessed    Cervical / Trunk Assessment Cervical / Trunk Assessment: Normal  Communication   Communication: No difficulties  Cognition Arousal/Alertness: Awake/alert Behavior During Therapy: WFL for tasks assessed/performed Overall Cognitive Status: Within Functional Limits for tasks assessed                                          General Comments      Exercises     Assessment/Plan    PT Assessment Patient needs continued PT services  PT Problem List Decreased activity tolerance;Cardiopulmonary status limiting activity       PT Treatment Interventions Gait training;Therapeutic exercise    PT Goals (Current goals can be found in the Care Plan section)  Acute Rehab PT Goals 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) PT Goal Formulation: With patient Time For Goal Achievement: 10/22/21 Potential to Achieve Goals: Good    Frequency Min 3X/week     Co-evaluation               AM-PAC PT "6 Clicks" Mobility  Outcome Measure Help needed turning from your back to your side while in a flat bed without using bedrails?: None Help needed moving from lying on your back to sitting on the side of a flat bed without using bedrails?: A Little Help needed moving to and from a bed to a chair (including a wheelchair)?: A Little Help needed standing up from a chair using your arms (e.g., wheelchair or bedside chair)?: A Little Help needed to walk in hospital room?: None Help needed climbing 3-5 steps with a railing? : A Little 6 Click Score: 20    End of Session Equipment Utilized During Treatment: Oxygen;Gait belt Activity Tolerance: Patient limited by fatigue Patient left: in bed;with call  bell/phone within reach Nurse Communication: Mobility status PT Visit Diagnosis: Difficulty in walking, not elsewhere classified (R26.2)    Time: 7782-4235 PT Time Calculation (min) (ACUTE ONLY): 25 min   Charges:   PT Evaluation $PT Eval Moderate Complexity: 1 Mod PT Treatments $Gait Training: 8-22 mins       Ralene Bathe Kistler PT 10/08/2021  Acute Rehabilitation Services Pager [phone] Office [phone]

## 2021-10-08 NOTE — Progress Notes (Signed)
PROGRESS NOTE    Zachary Hawkins  GDJ:[id number] DOB: 1956-04-17 DOA: 10/06/2021 PCP: Sebastian Ache, PA-C    Brief Narrative:  Zachary Hawkins is a 66 y.o. 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  dCHF, CKD 3, HTN, HLD.  A.Fib on eliquis.   Pt recently admitted in early April for acute on chronic hypercapnic failure due to opiate OD (unintentional), as well as CHF exacerbation.  Looks like torsemide dosing was added / increased following that admit.   Torsemide dosing increased further to 100mg  daily earlier this month it appears.   Pt presents to ED with c/o constipation, SOB, abd pain.    Assessment and Plan: Acute kidney injury superimposed on chronic kidney disease (HCC) -Think that dehydration (Overdiuresis) is cause of todays AKI on CKD. Suspect recent increases in demedex may be responsible (looks like demedex dose increased after early April admit, then increased again earlier this month) Looks like baseline creat ~ 1.5 Pt dry wt = 325lbs following last admit- daily weights Note also the 20:1 BUN:Cr ratio c/w pre-renal pattern. -Hold diuretics -Strict intake and output -No obstruction   SIRS (systemic inflammatory response syndrome) (HCC) Fever and tachycardia on presentation to ED today.  No source as of yet. -? Viral as procalcitonin low  Hypokalemia -replete aggressively   Chronic diastolic heart failure (HCC) Hold diuretics given AKI.  Chronic respiratory failure with hypercapnia (HCC) From COPD + OSA / HVOS On BIPAP at night per DC summary from April.  Says he has been using this more since then. Cont home nebs for COPD  Paroxysmal atrial fibrillation (HCC) Continue eliquis Cont bisoprolol  Class 3 obesity (HCC) Estimated body mass index is 42.52 kg/m as calculated from the following:   Height as of this encounter: 6\' 1"  (1.854 m).   Weight as of this encounter: 146.2 kg.  Dry wt on  discharge from April admit = 325lbs   Hyperlipidemia - statin  BPH (benign prostatic hyperplasia) -flomax  OSA (obstructive sleep apnea) BIPAP QHS  Essential hypertension -resume norvasc          DVT prophylaxis:  apixaban (ELIQUIS) tablet 5 mg    Code Status: Full Code Family Communication:   Disposition Plan:  Level of care: Progressive Status is: Inpatient Remains inpatient appropriate because: resolution of abdominal pain, electrolytes     Consultants:  none  Subjective: Had 2 small BMs yesterday  Objective: Vitals:   10/08/21 0457 10/08/21 0840 10/08/21 0841 10/08/21 0842  BP: (!) 123/92     Pulse: 78     Resp: 20 18    Temp: 98.4 F (36.9 C)     TempSrc: Oral     SpO2: 96% 98% 98% 98%  Weight:      Height:        Intake/Output Summary (Last 24 hours) at 10/08/2021 1112 Last data filed at 10/08/2021 1000 Gross per 24 hour  Intake 4793.03 ml  Output 1775 ml  Net 3018.03 ml   Filed Weights   10/06/21 1807 10/07/21 0100  Weight: (!) 154.7 kg (!) 146.2 kg    Examination:   General: Appearance:    Severely obese male in no acute distress     Lungs:     respirations unlabored, diminished  Heart:    Normal heart rate.   MS:   All extremities are intact.   Neurologic:   Awake, alert, poor insight, occasionally says something odd  Data Reviewed: I have personally reviewed following labs and imaging studies  CBC: Recent Labs  Lab 10/06/21 1818 10/07/21 0401 10/08/21 0353  WBC 6.5 5.5 4.9  NEUTROABS 4.2  --   --   HGB 13.9 14.0 13.2  HCT 44.9 43.7 43.4  MCV 90.5 91.2 93.7  PLT 251 253 [id number]   Basic Metabolic Panel: Recent Labs  Lab 10/06/21 1818 10/07/21 0401 10/07/21 1241 10/08/21 0353  NA 132* 133* 136 136  K 3.0* 2.6* 3.1* 3.2*  CL 74* 74* 80* 84*  CO2 43* 42* 42* 40*  GLUCOSE 172* 144* 130* 118*  BUN 84* 79* 80* 62*  CREATININE 3.53* 3.06* 2.98* 2.27*  CALCIUM 9.8 9.0 9.5 9.0  MG  --  2.6*  --   --     GFR: Estimated Creatinine Clearance: 50.1 mL/min (A) (by C-G formula based on SCr of 2.27 mg/dL (H)). Liver Function Tests: Recent Labs  Lab 10/06/21 1818  AST 18  ALT 15  ALKPHOS 65  BILITOT 0.6  PROT 7.9  ALBUMIN 4.2   No results for input(s): LIPASE, AMYLASE in the last 168 hours. No results for input(s): AMMONIA in the last 168 hours. Coagulation Profile: No results for input(s): INR, PROTIME in the last 168 hours. Cardiac Enzymes: No results for input(s): CKTOTAL, CKMB, CKMBINDEX, TROPONINI in the last 168 hours. BNP (last 3 results) No results for input(s): PROBNP in the last 8760 hours. HbA1C: No results for input(s): HGBA1C in the last 72 hours. CBG: No results for input(s): GLUCAP in the last 168 hours. Lipid Profile: No results for input(s): CHOL, HDL, LDLCALC, TRIG, CHOLHDL, LDLDIRECT in the last 72 hours. Thyroid Function Tests: No results for input(s): TSH, T4TOTAL, FREET4, T3FREE, THYROIDAB in the last 72 hours. Anemia Panel: No results for input(s): VITAMINB12, FOLATE, FERRITIN, TIBC, IRON, RETICCTPCT in the last 72 hours. Sepsis Labs: Recent Labs  Lab 10/06/21 2352 10/07/21 0401 10/08/21 0353  PROCALCITON 0.30 0.18 <0.10    Recent Results (from the past 240 hour(s))  Resp Panel by RT-PCR (Flu A&B, Covid) Urine, Clean Catch     Status: None   Collection Time: 10/06/21  7:13 PM   Specimen: Urine, Clean Catch; Nasal Swab  Result Value Ref Range Status   SARS Coronavirus 2 by RT PCR NEGATIVE NEGATIVE Final    Comment: (NOTE) SARS-CoV-2 target nucleic acids are NOT DETECTED.  The SARS-CoV-2 RNA is generally detectable in upper respiratory specimens during the acute phase of infection. The lowest concentration of SARS-CoV-2 viral copies this assay can detect is 138 copies/mL. A negative result does not preclude SARS-Cov-2 infection and should not be used as the sole basis for treatment or other patient management decisions. A negative result may  occur with  improper specimen collection/handling, submission of specimen other than nasopharyngeal swab, presence of viral mutation(s) within the areas targeted by this assay, and inadequate number of viral copies(<138 copies/mL). A negative result must be combined with clinical observations, patient history, and epidemiological information. The expected result is Negative.  Fact Sheet for Patients:  EntrepreneurPulse.com.au  Fact Sheet for Healthcare Providers:  IncredibleEmployment.be  This test is no t yet approved or cleared by the [place] FDA and  has been authorized for detection and/or diagnosis of SARS-CoV-2 by FDA under an Emergency Use Authorization (EUA). This EUA will remain  in effect (meaning this test can be used) for the duration of the COVID-19 declaration under Section 564(b)(1) of the Act, 21 U.S.C.section 360bbb-3(b)(1), unless the authorization is  terminated  or revoked sooner.       Influenza A by PCR NEGATIVE NEGATIVE Final   Influenza B by PCR NEGATIVE NEGATIVE Final    Comment: (NOTE) The Xpert Xpress SARS-CoV-2/FLU/RSV plus assay is intended as an aid in the diagnosis of influenza from Nasopharyngeal swab specimens and should not be used as a sole basis for treatment. Nasal washings and aspirates are unacceptable for Xpert Xpress SARS-CoV-2/FLU/RSV testing.  Fact Sheet for Patients: EntrepreneurPulse.com.au  Fact Sheet for Healthcare Providers: IncredibleEmployment.be  This test is not yet approved or cleared by the [place] FDA and has been authorized for detection and/or diagnosis of SARS-CoV-2 by FDA under an Emergency Use Authorization (EUA). This EUA will remain in effect (meaning this test can be used) for the duration of the COVID-19 declaration under Section 564(b)(1) of the Act, 21 U.S.C. section 360bbb-3(b)(1), unless the authorization is terminated  or revoked.  Performed at [facility], [address], [place], [place] [address]   MRSA Next Gen by PCR, Nasal     Status: Abnormal   Collection Time: 10/07/21  3:20 AM   Specimen: Nasal Mucosa; Nasal Swab  Result Value Ref Range Status   MRSA by PCR Next Gen DETECTED (A) NOT DETECTED Final    Comment: (NOTE) The GeneXpert MRSA Assay (FDA approved for NASAL specimens only), is one component of a comprehensive MRSA colonization surveillance program. It is not intended to diagnose MRSA infection nor to guide or monitor treatment for MRSA infections. Test performance is not FDA approved in patients less than 26 years old. Performed at [facility], [place] [address] [place], [place] [address]          Radiology Studies: CT ABDOMEN PELVIS WO CONTRAST  Result Date: 10/06/2021 CLINICAL DATA:  Abdominal pain, constipation EXAM: CT ABDOMEN AND PELVIS WITHOUT CONTRAST TECHNIQUE: Multidetector CT imaging of the abdomen and pelvis was performed following the standard protocol without IV contrast. RADIATION DOSE REDUCTION: This exam was performed according to the departmental dose-optimization program which includes automated exposure control, adjustment of the mA and/or kV according to patient size and/or use of iterative reconstruction technique. COMPARISON:  04/29/2021 FINDINGS: Lower chest: Eventration of the right hemidiaphragm with associated right basilar atelectasis. Hepatobiliary: Unenhanced liver is unremarkable. Gallbladder is unremarkable. No intrahepatic or extrahepatic duct dilatation. Pancreas: Within normal limits. Spleen: Within normal limits. Adrenals/Urinary Tract: Adrenal glands are within normal limits. Bilateral renal cysts, measuring up to 2.9 cm in the left upper kidney (series 2/image 39). No renal calculi or hydronephrosis. Bladder is underdistended but unremarkable. Stomach/Bowel: Stomach is within normal limits. No evidence of bowel  obstruction. Normal appendix (series 2/image 68). Mild sigmoid diverticulosis, without evidence of diverticulitis. Vascular/Lymphatic: No evidence of abdominal aortic aneurysm. Atherosclerotic calcifications of the abdominal aorta and branch vessels. No suspicious abdominopelvic lymphadenopathy. Reproductive: Prostate is notable for dystrophic calcifications. Other: No abdominopelvic ascites. Small fat containing left inguinal hernia (series 2/image 94). Small fat containing periumbilical hernia (series 2/image 37). Musculoskeletal: Degenerative changes of the visualized thoracolumbar spine. IMPRESSION: No CT findings to account for the patient's abdominal pain. Additional ancillary findings as above. Electronically Signed   By: Julian Hy M.D.   On: 10/06/2021 20:09   DG Abdomen Acute W/Chest  Result Date: 10/06/2021 CLINICAL DATA:  Shortness of breath and constipation. Abdominal pain. EXAM: DG ABDOMEN ACUTE WITH 1 VIEW CHEST COMPARISON:  08/17/2021 FINDINGS: Previous endotracheal and nasogastric tubes are no longer present. Improved aeration in the right lung with some  remaining volume loss and bandlike density at the right lung base. Thoracic spondylosis. Atherosclerotic calcification of the aortic arch. Scattered gas in the colon.  The small bowel is mostly gasless. Thoracolumbar spondylosis noted. IMPRESSION: 1. Substantially improved aeration in the right lung with only some bandlike atelectasis or scarring at the right lung base remaining. 2.  Aortic Atherosclerosis (ICD10-I70.0). 3. Thoracolumbar spondylosis. 4. No specific bowel gas abnormality is identified although much of the small bowel is gasless. No significant amount of colonic stool noted. Electronically Signed   By: Van Clines M.D.   On: 10/06/2021 19:31        Scheduled Meds:  apixaban  5 mg Oral BID   atorvastatin  10 mg Oral Daily   bisoprolol  5 mg Oral Daily   budesonide  0.25 mg Nebulization BID   chlorhexidine   15 mL Mouth Rinse BID   doxazosin  2 mg Oral Daily   famotidine  20 mg Oral BID   fluticasone  1 spray Each Nare Daily   ipratropium-albuterol  3 mL Nebulization BID   mouth rinse  15 mL Mouth Rinse q12n4p   mometasone-formoterol  2 puff Inhalation BID   pantoprazole  40 mg Oral Daily   polyethylene glycol  17 g Oral BID   tamsulosin  0.4 mg Oral QPC supper   Continuous Infusions:     LOS: 1 day    Time spent: 65 minutes spent on chart review, discussion with nursing staff, consultants, updating family and interview/physical exam; more than 50% of that time was spent in counseling and/or coordination of care.    Geradine Girt, DO [facility] Available via Epic secure chat 7am-7pm After these hours, please refer to coverage provider listed on [email or web address] 10/08/2021, 11:12 AM

## 2021-10-09 ENCOUNTER — Inpatient Hospital Stay (HOSPITAL_COMMUNITY): Payer: Medicaid Other

## 2021-10-09 ENCOUNTER — Encounter (HOSPITAL_COMMUNITY): Payer: Medicaid Other | Admitting: Cardiology

## 2021-10-09 DIAGNOSIS — N189 Chronic kidney disease, unspecified: Secondary | ICD-10-CM | POA: Diagnosis not present

## 2021-10-09 DIAGNOSIS — N179 Acute kidney failure, unspecified: Secondary | ICD-10-CM | POA: Diagnosis not present

## 2021-10-09 LAB — BASIC METABOLIC PANEL
Anion gap: 11 (ref 5–15)
BUN: 47 mg/dL — ABNORMAL HIGH (ref 8–23)
CO2: 38 mmol/L — ABNORMAL HIGH (ref 22–32)
Calcium: 8.8 mg/dL — ABNORMAL LOW (ref 8.9–10.3)
Chloride: 87 mmol/L — ABNORMAL LOW (ref 98–111)
Creatinine, Ser: 1.83 mg/dL — ABNORMAL HIGH (ref 0.61–1.24)
GFR, Estimated: 41 mL/min — ABNORMAL LOW (ref >60)
Glucose, Bld: 114 mg/dL — ABNORMAL HIGH (ref 70–99)
Potassium: 3.2 mmol/L — ABNORMAL LOW (ref 3.5–5.1)
Sodium: 136 mmol/L (ref 135–145)

## 2021-10-09 MED ORDER — BUDESONIDE 0.25 MG/2ML IN SUSP
0.2500 mg | Freq: Two times a day (BID) | RESPIRATORY_TRACT | Status: DC
  Administered 2021-10-09 – 2021-10-10 (×3): 0.25 mg via RESPIRATORY_TRACT
  Filled 2021-10-09 (×4): qty 2

## 2021-10-09 MED ORDER — POTASSIUM CHLORIDE CRYS ER 20 MEQ PO TBCR
40.0000 meq | EXTENDED_RELEASE_TABLET | Freq: Once | ORAL | Status: AC
Start: 2021-10-09 — End: 2021-10-09
  Administered 2021-10-09: 40 meq via ORAL
  Filled 2021-10-09: qty 2

## 2021-10-09 NOTE — Progress Notes (Signed)
Physical Therapy Treatment Patient Details Name: Zachary Hawkins MRN: [id number] DOB: 10/24/1955 Today's Date: 10/09/2021   History of Present Illness 66 y.o. 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  dCHF, CKD 3, HTN, HLD.  A.Fib on eliquis.     Pt recently admitted in early April for acute on chronic hypercapnic failure due to opiate OD (unintentional), as well as CHF exacerbation.  Looks like torsemide dosing was added / increased following that admit. Pt presents to ED with c/o constipation, SOB, abd pain. Dx of AKI on CKD, SIRS.    PT Comments    Pt has made excellent progress with mobility, he ambulated 400' with a RW and 2L O2, no loss of balance, SaO2 95% on 2L O2. Instructed pt in seated BUE/LE strengthening exercises to be done independently. He has met PT goals and is ready to DC home from a PT standpoint. PT signing off. Pt would benefit from ambulating in the halls TID with nursing.    Recommendations for follow up therapy are one component of a multi-disciplinary discharge planning process, led by the attending physician.  Recommendations may be updated based on patient status, additional functional criteria and insurance authorization.  Follow Up Recommendations  No PT follow up     Assistance Recommended at Discharge None  Patient can return home with the following     Equipment Recommendations  None recommended by PT    Recommendations for Other Services       Precautions / Restrictions Precautions Precautions: Other (comment) Precaution Comments: on 2L O2 baseline, monitor sats Restrictions Weight Bearing Restrictions: No     Mobility  Bed Mobility               General bed mobility comments: up in recliner    Transfers Overall transfer level: Modified independent Equipment used: Rolling walker (2 wheels) Transfers: Sit to/from Stand Sit to Stand: Modified independent (Device/Increase time)                 Ambulation/Gait Ambulation/Gait assistance: Modified independent (Device/Increase time) Gait Distance (Feet): 400 Feet Assistive device: Rolling walker (2 wheels) Gait Pattern/deviations: WFL(Within Functional Limits)       General Gait Details: steady with RW, pt on 2L O2 [name], SaO2 95%   Stairs             Wheelchair Mobility    Modified Rankin (Stroke Patients Only)       Balance Overall balance assessment: Modified Independent                                          Cognition Arousal/Alertness: Awake/alert Behavior During Therapy: WFL for tasks assessed/performed Overall Cognitive Status: Within Functional Limits for tasks assessed                                          Exercises General Exercises - Lower Extremity Long Arc Quad: AROM, Both, 10 reps, Seated Hip Flexion/Marching: AROM, Both, 15 reps, Seated Shoulder Exercises Pendulum Exercise: AROM, Both, 15 reps, Seated    General Comments        Pertinent Vitals/Pain Pain Assessment Pain Assessment: No/denies pain    Home Living  Prior Function            PT Goals (current goals can now be found in the care plan section) Acute Rehab PT Goals Patient Stated 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) PT Goal Formulation: With patient Time For Goal Achievement: 10/22/21 Potential to Achieve Goals: Good Progress towards PT goals: Goals met/education completed, patient discharged from PT    Frequency    Min 3X/week      PT Plan Current plan remains appropriate    Co-evaluation              AM-PAC PT "6 Clicks" Mobility   Outcome Measure  Help needed turning from your back to your side while in a flat bed without using bedrails?: None Help needed moving from lying on your back to sitting on the side of a flat bed without using bedrails?: None Help needed moving to  and from a bed to a chair (including a wheelchair)?: None Help needed standing up from a chair using your arms (e.g., wheelchair or bedside chair)?: None Help needed to walk in hospital room?: None Help needed climbing 3-5 steps with a railing? : None 6 Click Score: 24    End of Session Equipment Utilized During Treatment: Oxygen Activity Tolerance: Patient tolerated treatment well Patient left: with call bell/phone within reach;in chair Nurse Communication: Mobility status PT Visit Diagnosis: Difficulty in walking, not elsewhere classified (R26.2)     Time: 2458-0998 PT Time Calculation (min) (ACUTE ONLY): 19 min  Charges:  $Gait Training: 8-22 mins                     Zachary Hawkins PT 10/09/2021  Acute Rehabilitation Services Pager [phone] Office [phone]

## 2021-10-09 NOTE — TOC Progression Note (Signed)
Transition of Care [facility]) - Progression Note    Patient Details  Name: RIOT WATERWORTH MRN: [id number] Date of Birth: [birth year]  Transition of Care [facility]) CM/SW Contact  Golda Acre, RN Phone Number: 10/09/2021, 8:25 AM  Clinical Narrative:    Following for toc needs.   Expected Discharge Plan: Home/Self Care Barriers to Discharge: No Barriers Identified  Expected Discharge Plan and Services Expected Discharge Plan: Home/Self Care   Discharge Planning Services: CM Consult   Living arrangements for the past 2 months: Single Family Home                                       Social Determinants of Health (SDOH) Interventions    Readmission Risk Interventions    06/25/2021    3:22 PM 05/04/2021   10:03 AM 03/13/2021   12:35 PM  Readmission Risk Prevention Plan  Transportation Screening Complete Complete Complete  PCP or Specialist Appt within 3-5 Days  Complete   HRI or Home Care Consult  Complete   Social Work Consult for Recovery Care Planning/Counseling  Complete   Palliative Care Screening  Not Applicable   Medication Review Ocean[employer]) Complete Complete Complete  PCP or Specialist appointment within 3-5 days of discharge Complete  Complete  HRI or Home Care Consult Complete  Complete  SW Recovery Care/Counseling Consult Complete  Complete  Palliative Care Screening Not Applicable  Not Applicable  Skilled Nursing Facility Complete  Complete

## 2021-10-09 NOTE — Progress Notes (Addendum)
PROGRESS NOTE    Zachary Hawkins  [id number] DOB: October 26, 1955 DOA: 10/06/2021 PCP: Dorise Hiss, PA-C    Brief Narrative:  Zachary Hawkins is a 66 y.o. 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  dCHF, CKD 3, HTN, HLD.  A.Fib on eliquis.   Pt recently admitted in early April for acute on chronic hypercapnic failure due to opiate OD (unintentional), as well as CHF exacerbation.  Looks like torsemide dosing was added / increased following that admit.   Torsemide dosing increased further to 100mg  daily earlier this month it appears.   Pt presents to ED with c/o constipation, SOB, abd pain.  Slowly improving   Assessment and Plan: Acute kidney injury superimposed on chronic kidney disease ([place]) -Think that dehydration (Overdiuresis) is cause of todays AKI on CKD. Suspect recent increases in demedex may be responsible (looks like demedex dose increased after early April admit, then increased again earlier this month) Looks like baseline creat ~ 1.5 Pt dry wt = 325lbs following last admit- daily weights Note also the 20:1 BUN:Cr ratio c/w pre-renal pattern. -Hold diuretics for 24 more hours? -Strict intake and output -No obstruction   SIRS (systemic inflammatory response syndrome) (HCC) Fever and tachycardia on presentation to ED today.  No source found -? Viral as procalcitonin low  Hypokalemia -replete aggressively  Chronic diastolic heart failure (HCC) Hold diuretics given AKI.  Chronic respiratory failure with hypercapnia (HCC) From COPD + OSA / HVOS On BIPAP at night per DC summary from April.  Says he has been using this more since then. Cont home nebs for COPD  Paroxysmal atrial fibrillation (HCC) Continue eliquis Cont bisoprolol  Class 3 obesity (HCC) Estimated body mass index is 43.41 kg/m as calculated from the following:   Height as of this encounter: 6\' 1"  (1.854 m).   Weight as of this  encounter: 149.2 kg.    Hyperlipidemia - statin  BPH (benign prostatic hyperplasia) -flomax  OSA (obstructive sleep apnea) BIPAP QHS  Essential hypertension -hold norvasc          DVT prophylaxis: Place TED hose Start: 10/08/21 1114 apixaban (ELIQUIS) tablet 5 mg    Code Status: Full Code   Disposition Plan:  Level of care: Progressive Status is: Inpatient Remains inpatient appropriate because: resolution of abdominal pain, electrolytes     Consultants:  none  Subjective: Still feels like he needs to have a large BM  Objective: Vitals:   10/09/21 0047 10/09/21 0351 10/09/21 0500 10/09/21 0913  BP: (!) 117/97 105/66    Pulse: 84 67    Resp:      Temp: 98.5 F (36.9 C) 98.9 F (37.2 C)    TempSrc: Oral Oral    SpO2: 95% 95%  95%  Weight:   (!) 149.2 kg   Height:        Intake/Output Summary (Last 24 hours) at 10/09/2021 1233 Last data filed at 10/09/2021 1151 Gross per 24 hour  Intake 892.22 ml  Output 1800 ml  Net -907.78 ml   Filed Weights   10/06/21 1807 10/07/21 0100 10/09/21 0500  Weight: (!) 154.7 kg (!) 146.2 kg (!) 149.2 kg    Examination:   General: Appearance:    Severely obese male in no acute distress, sleeping and not wearing CPAP   +BS, obese abdomen, distended but not tender  Lungs:      respirations unlabored  Heart:    Normal heart rate.  MS:   All extremities are intact.  + LE edema   Neurologic:   Awake, alert           Data Reviewed: I have personally reviewed following labs and imaging studies  CBC: Recent Labs  Lab 10/06/21 1818 10/07/21 0401 10/08/21 0353  WBC 6.5 5.5 4.9  NEUTROABS 4.2  --   --   HGB 13.9 14.0 13.2  HCT 44.9 43.7 43.4  MCV 90.5 91.2 93.7  PLT 251 253 [id number]   Basic Metabolic Panel: Recent Labs  Lab 10/06/21 1818 10/07/21 0401 10/07/21 1241 10/08/21 0353 10/09/21 0348  NA 132* 133* 136 136 136  K 3.0* 2.6* 3.1* 3.2* 3.2*  CL 74* 74* 80* 84* 87*  CO2 43* 42* 42* 40* 38*  GLUCOSE  172* 144* 130* 118* 114*  BUN 84* 79* 80* 62* 47*  CREATININE 3.53* 3.06* 2.98* 2.27* 1.83*  CALCIUM 9.8 9.0 9.5 9.0 8.8*  MG  --  2.6*  --   --   --    GFR: Estimated Creatinine Clearance: 62.9 mL/min (A) (by C-G formula based on SCr of 1.83 mg/dL (H)). Liver Function Tests: Recent Labs  Lab 10/06/21 1818  AST 18  ALT 15  ALKPHOS 65  BILITOT 0.6  PROT 7.9  ALBUMIN 4.2   No results for input(s): LIPASE, AMYLASE in the last 168 hours. No results for input(s): AMMONIA in the last 168 hours. Coagulation Profile: No results for input(s): INR, PROTIME in the last 168 hours. Cardiac Enzymes: No results for input(s): CKTOTAL, CKMB, CKMBINDEX, TROPONINI in the last 168 hours. BNP (last 3 results) No results for input(s): PROBNP in the last 8760 hours. HbA1C: No results for input(s): HGBA1C in the last 72 hours. CBG: No results for input(s): GLUCAP in the last 168 hours. Lipid Profile: No results for input(s): CHOL, HDL, LDLCALC, TRIG, CHOLHDL, LDLDIRECT in the last 72 hours. Thyroid Function Tests: No results for input(s): TSH, T4TOTAL, FREET4, T3FREE, THYROIDAB in the last 72 hours. Anemia Panel: No results for input(s): VITAMINB12, FOLATE, FERRITIN, TIBC, IRON, RETICCTPCT in the last 72 hours. Sepsis Labs: Recent Labs  Lab 10/06/21 2352 10/07/21 0401 10/08/21 0353  PROCALCITON 0.30 0.18 <0.10    Recent Results (from the past 240 hour(s))  Culture, blood (routine x 2)     Status: None (Preliminary result)   Collection Time: 10/06/21  6:17 PM   Specimen: BLOOD  Result Value Ref Range Status   Specimen Description   Final    BLOOD RIGHT FATTY CASTS Performed at [facility], [address] [place], [place] [address]    Special Requests   Final    BOTTLES DRAWN AEROBIC AND ANAEROBIC Blood Culture adequate volume Performed at [facility], [address] [place], [place] [address]    Culture   Final    NO GROWTH 2  DAYS Performed at [facility], [place] [address] [place], [place] [address]    Report Status PENDING  Incomplete  Culture, blood (routine x 2)     Status: None (Preliminary result)   Collection Time: 10/06/21  6:22 PM   Specimen: BLOOD  Result Value Ref Range Status   Specimen Description   Final    BLOOD LEFT ANTECUBITAL Performed at [facility], [address], [place], [place] [address]    Special Requests   Final    BOTTLES DRAWN AEROBIC AND ANAEROBIC Blood Culture adequate volume Performed at [facility], [address], [place],  [place] [address]    Culture   Final    NO GROWTH 2 DAYS Performed at [facility], [place] [address], [place], [place] [address]    Report Status PENDING  Incomplete  Resp Panel by RT-PCR (Flu A&B, Covid) Urine, Clean Catch     Status: None   Collection Time: 10/06/21  7:13 PM   Specimen: Urine, Clean Catch; Nasal Swab  Result Value Ref Range Status   SARS Coronavirus 2 by RT PCR NEGATIVE NEGATIVE Final    Comment: (NOTE) SARS-CoV-2 target nucleic acids are NOT DETECTED.  The SARS-CoV-2 RNA is generally detectable in upper respiratory specimens during the acute phase of infection. The lowest concentration of SARS-CoV-2 viral copies this assay can detect is 138 copies/mL. A negative result does not preclude SARS-Cov-2 infection and should not be used as the sole basis for treatment or other patient management decisions. A negative result may occur with  improper specimen collection/handling, submission of specimen other than nasopharyngeal swab, presence of viral mutation(s) within the areas targeted by this assay, and inadequate number of viral copies(<138 copies/mL). A negative result must be combined with clinical observations, patient history, and epidemiological information. The expected result is Negative.  Fact Sheet for Patients:  EntrepreneurPulse.com.au  Fact Sheet  for Healthcare Providers:  IncredibleEmployment.be  This test is no t yet approved or cleared by the [place] FDA and  has been authorized for detection and/or diagnosis of SARS-CoV-2 by FDA under an Emergency Use Authorization (EUA). This EUA will remain  in effect (meaning this test can be used) for the duration of the COVID-19 declaration under Section 564(b)(1) of the Act, 21 U.S.C.section 360bbb-3(b)(1), unless the authorization is terminated  or revoked sooner.       Influenza A by PCR NEGATIVE NEGATIVE Final   Influenza B by PCR NEGATIVE NEGATIVE Final    Comment: (NOTE) The Xpert Xpress SARS-CoV-2/FLU/RSV plus assay is intended as an aid in the diagnosis of influenza from Nasopharyngeal swab specimens and should not be used as a sole basis for treatment. Nasal washings and aspirates are unacceptable for Xpert Xpress SARS-CoV-2/FLU/RSV testing.  Fact Sheet for Patients: EntrepreneurPulse.com.au  Fact Sheet for Healthcare Providers: IncredibleEmployment.be  This test is not yet approved or cleared by the [place] FDA and has been authorized for detection and/or diagnosis of SARS-CoV-2 by FDA under an Emergency Use Authorization (EUA). This EUA will remain in effect (meaning this test can be used) for the duration of the COVID-19 declaration under Section 564(b)(1) of the Act, 21 U.S.C. section 360bbb-3(b)(1), unless the authorization is terminated or revoked.  Performed at [facility], [address] [place], [place] [address]   MRSA Next Gen by PCR, Nasal     Status: Abnormal   Collection Time: 10/07/21  3:20 AM   Specimen: Nasal Mucosa; Nasal Swab  Result Value Ref Range Status   MRSA by PCR Next Gen DETECTED (A) NOT DETECTED Final    Comment: (NOTE) The GeneXpert MRSA Assay (FDA approved for NASAL specimens only), is one component of a comprehensive MRSA colonization  surveillance program. It is not intended to diagnose MRSA infection nor to guide or monitor treatment for MRSA infections. Test performance is not FDA approved in patients less than 15 years old. Performed at [facility], [place] [address], [place], [place] [address]          Radiology Studies: DG Abd Portable 1V  Result Date: 10/09/2021 CLINICAL DATA:  Abdominal distension. EXAM:  PORTABLE ABDOMEN - 1 VIEW COMPARISON:  Oct 06, 2021. FINDINGS: The bowel gas pattern is normal. No radio-opaque calculi or other significant radiographic abnormality are seen. IMPRESSION: Negative. Electronically Signed   By: Marijo Conception M.D.   On: 10/09/2021 08:59        Scheduled Meds:  amLODipine  5 mg Oral Daily   apixaban  5 mg Oral BID   atorvastatin  10 mg Oral Daily   bisoprolol  5 mg Oral Daily   budesonide  0.25 mg Nebulization BID   chlorhexidine  15 mL Mouth Rinse BID   Chlorhexidine Gluconate Cloth  6 each Topical Q0600   doxazosin  2 mg Oral Daily   famotidine  20 mg Oral BID   fluticasone  1 spray Each Nare Daily   ipratropium-albuterol  3 mL Nebulization BID   mouth rinse  15 mL Mouth Rinse q12n4p   mometasone-formoterol  2 puff Inhalation BID   mupirocin ointment  1 application. Nasal BID   pantoprazole  40 mg Oral Daily   polyethylene glycol  17 g Oral BID   tamsulosin  0.4 mg Oral QPC supper   Continuous Infusions:     LOS: 2 days    Time spent: 65 minutes spent on chart review, discussion with nursing staff, consultants, updating family and interview/physical exam; more than 50% of that time was spent in counseling and/or coordination of care.    Geradine Girt, DO [facility] Available via Epic secure chat 7am-7pm After these hours, please refer to coverage provider listed on [email or web address] 10/09/2021, 12:33 PM

## 2021-10-10 LAB — RENAL FUNCTION PANEL
Albumin: 3.3 g/dL — ABNORMAL LOW (ref 3.5–5.0)
Anion gap: 9 (ref 5–15)
BUN: 34 mg/dL — ABNORMAL HIGH (ref 8–23)
CO2: 37 mmol/L — ABNORMAL HIGH (ref 22–32)
Calcium: 8.9 mg/dL (ref 8.9–10.3)
Chloride: 93 mmol/L — ABNORMAL LOW (ref 98–111)
Creatinine, Ser: 1.63 mg/dL — ABNORMAL HIGH (ref 0.61–1.24)
GFR, Estimated: 47 mL/min — ABNORMAL LOW (ref >60)
Glucose, Bld: 155 mg/dL — ABNORMAL HIGH (ref 70–99)
Phosphorus: 2.9 mg/dL (ref 2.5–4.6)
Potassium: 3.6 mmol/L (ref 3.5–5.1)
Sodium: 139 mmol/L (ref 135–145)

## 2021-10-10 LAB — MAGNESIUM: Magnesium: 2.2 mg/dL (ref 1.7–2.4)

## 2021-10-10 NOTE — Progress Notes (Signed)
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  dCHF, CKD 3, HTN, HLD.  A.Fib on eliquis.  Pt recently admitted in early April for acute on chronic hypercapnic failure due to opiate OD (unintentional), as well as CHF exacerbation.  Patient has since been on significant amount of torsemide.  Patient was admitted with constipation, shortness of breath, abdominal pain as significant hypokalemia.  Worsening renal function with also noted.  Hypokalemia persists.  Renal function is improving.  We will repeat renal panel and magnesium today.  Continue to supplement potassium.  Constipation has resolved significantly.  Shortness of breath is improving.  Abdominal pain has resolved.  Likely, patient will be discharged back home tomorrow.    Assessment and Plan: Acute kidney injury superimposed on chronic kidney disease ([place]) -Think that dehydration (Overdiuresis) is cause of todays AKI on CKD. Suspect recent increases in demedex may be responsible (looks like demedex dose increased after early April admit, then increased again earlier this month) Looks like baseline creat ~ 1.5 Pt dry wt = 325lbs following last admit- daily weights Note also the 20:1 BUN:Cr ratio c/w pre-renal pattern. -Hold diuretics for 24 more hours? -Strict intake and output -No obstruction  11/06/2021: Acute kidney injury is slowly improving.  Continue to monitor renal function and electrolytes.  SIRS (systemic inflammatory response syndrome) (HCC) Fever and tachycardia on presentation to ED today.  No source found -? Viral as procalcitonin low  Hypokalemia -replete aggressively -Repeat renal panel and magnesium today. Replace.  Chronic diastolic heart failure (HCC) Hold diuretics  given AKI.  Chronic respiratory failure with hypercapnia (HCC) From COPD + OSA / HVOS On BIPAP at night per DC summary from April.  Says he has been using this more since then. Cont home nebs for COPD  Paroxysmal atrial fibrillation (HCC) Continue eliquis Cont bisoprolol  Class 3 obesity (HCC) Estimated body mass index is 43.69 kg/m as calculated from the following:   Height as of this encounter: 6\' 1"  (1.854 m).   Weight as of this encounter: 150.2 kg.    Hyperlipidemia - statin  BPH (benign prostatic hyperplasia) -flomax  OSA (obstructive sleep apnea) BIPAP QHS  Essential hypertension -hold norvas  DVT prophylaxis: Place TED hose Start: 10/08/21 1114 apixaban (ELIQUIS) tablet 5 mg    Code Status: Full Code   Disposition Plan:  Level of care: Progressive Status is: Inpatient Remains inpatient appropriate because: resolution of abdominal pain, electrolytes     Consultants:  none  Subjective: The patient is improving. No constipation  Objective: Vitals:   10/10/21 0500 10/10/21 0833 10/10/21 1306 10/10/21 1309  BP:   97/67 102/76  Pulse:   84   Resp:   20   Temp:   98.4 F (36.9 C)   TempSrc:      SpO2:  98% 96%   Weight: (!) 150.2 kg     Height:        Intake/Output Summary (Last 24 hours) at 10/10/2021 1732 Last data filed at 10/10/2021 1532 Gross per 24 hour  Intake 640 ml  Output 1550 ml  Net -910 ml    Filed Weights   10/07/21 0100 10/09/21 0500 10/10/21 0500  Weight: (!) 146.2 kg (!) 149.2 kg (!) 150.2 kg    Examination: General condition: Patient  is morbidly obese.  Patient is awake and alert.  Patient is not in any distress. HEENT: Patient has mild pallor.  No jaundice. Neck: Supple.  JVD is difficult to assess. Lungs: Decreased air entry. CVS: S1-S2. Abdomen: Obese, soft, nontender. Neuro: Awake and alert.  Patient moves all extremities. Extremities: Mild leg edema.  Data Reviewed: I have personally reviewed following labs and  imaging studies  CBC: Recent Labs  Lab 10/06/21 1818 10/07/21 0401 10/08/21 0353  WBC 6.5 5.5 4.9  NEUTROABS 4.2  --   --   HGB 13.9 14.0 13.2  HCT 44.9 43.7 43.4  MCV 90.5 91.2 93.7  PLT 251 253 [id number]    Basic Metabolic Panel: Recent Labs  Lab 10/06/21 1818 10/07/21 0401 10/07/21 1241 10/08/21 0353 10/09/21 0348  NA 132* 133* 136 136 136  K 3.0* 2.6* 3.1* 3.2* 3.2*  CL 74* 74* 80* 84* 87*  CO2 43* 42* 42* 40* 38*  GLUCOSE 172* 144* 130* 118* 114*  BUN 84* 79* 80* 62* 47*  CREATININE 3.53* 3.06* 2.98* 2.27* 1.83*  CALCIUM 9.8 9.0 9.5 9.0 8.8*  MG  --  2.6*  --   --   --     GFR: Estimated Creatinine Clearance: 63.1 mL/min (A) (by C-G formula based on SCr of 1.83 mg/dL (H)). Liver Function Tests: Recent Labs  Lab 10/06/21 1818  AST 18  ALT 15  ALKPHOS 65  BILITOT 0.6  PROT 7.9  ALBUMIN 4.2    No results for input(s): LIPASE, AMYLASE in the last 168 hours. No results for input(s): AMMONIA in the last 168 hours. Coagulation Profile: No results for input(s): INR, PROTIME in the last 168 hours. Cardiac Enzymes: No results for input(s): CKTOTAL, CKMB, CKMBINDEX, TROPONINI in the last 168 hours. BNP (last 3 results) No results for input(s): PROBNP in the last 8760 hours. HbA1C: No results for input(s): HGBA1C in the last 72 hours. CBG: No results for input(s): GLUCAP in the last 168 hours. Lipid Profile: No results for input(s): CHOL, HDL, LDLCALC, TRIG, CHOLHDL, LDLDIRECT in the last 72 hours. Thyroid Function Tests: No results for input(s): TSH, T4TOTAL, FREET4, T3FREE, THYROIDAB in the last 72 hours. Anemia Panel: No results for input(s): VITAMINB12, FOLATE, FERRITIN, TIBC, IRON, RETICCTPCT in the last 72 hours. Sepsis Labs: Recent Labs  Lab 10/06/21 2352 10/07/21 0401 10/08/21 0353  PROCALCITON 0.30 0.18 <0.10     Recent Results (from the past 240 hour(s))  Culture, blood (routine x 2)     Status: None (Preliminary result)   Collection Time:  10/06/21  6:17 PM   Specimen: BLOOD  Result Value Ref Range Status   Specimen Description   Final    BLOOD RIGHT FATTY CASTS Performed at [facility], [address] [place], [place] [address]    Special Requests   Final    BOTTLES DRAWN AEROBIC AND ANAEROBIC Blood Culture adequate volume Performed at [facility], [address], [place], [place] [address]    Culture   Final    NO GROWTH 3 DAYS Performed at [facility], [place] [address] [place], [place] [address]    Report Status PENDING  Incomplete  Culture, blood (routine x 2)     Status: None (Preliminary result)   Collection Time: 10/06/21  6:22 PM   Specimen: BLOOD  Result Value Ref Range Status   Specimen Description   Final    BLOOD LEFT ANTECUBITAL Performed at [facility], [address] [place],  [place] [address]    Special Requests   Final    BOTTLES DRAWN AEROBIC AND ANAEROBIC Blood Culture adequate volume Performed at [facility], [address], [place], [place] [address]    Culture   Final    NO GROWTH 3 DAYS Performed at [facility], [place] [address] [place], [place] [address]    Report Status PENDING  Incomplete  Resp Panel by RT-PCR (Flu A&B, Covid) Urine, Clean Catch     Status: None   Collection Time: 10/06/21  7:13 PM   Specimen: Urine, Clean Catch; Nasal Swab  Result Value Ref Range Status   SARS Coronavirus 2 by RT PCR NEGATIVE NEGATIVE Final    Comment: (NOTE) SARS-CoV-2 target nucleic acids are NOT DETECTED.  The SARS-CoV-2 RNA is generally detectable in upper respiratory specimens during the acute phase of infection. The lowest concentration of SARS-CoV-2 viral copies this assay can detect is 138 copies/mL. A negative result does not preclude SARS-Cov-2 infection and should not be used as the sole basis for treatment or other patient management decisions. A negative result may occur with  improper  specimen collection/handling, submission of specimen other than nasopharyngeal swab, presence of viral mutation(s) within the areas targeted by this assay, and inadequate number of viral copies(<138 copies/mL). A negative result must be combined with clinical observations, patient history, and epidemiological information. The expected result is Negative.  Fact Sheet for Patients:  EntrepreneurPulse.com.au  Fact Sheet for Healthcare Providers:  IncredibleEmployment.be  This test is no t yet approved or cleared by the [place] FDA and  has been authorized for detection and/or diagnosis of SARS-CoV-2 by FDA under an Emergency Use Authorization (EUA). This EUA will remain  in effect (meaning this test can be used) for the duration of the COVID-19 declaration under Section 564(b)(1) of the Act, 21 U.S.C.section 360bbb-3(b)(1), unless the authorization is terminated  or revoked sooner.       Influenza A by PCR NEGATIVE NEGATIVE Final   Influenza B by PCR NEGATIVE NEGATIVE Final    Comment: (NOTE) The Xpert Xpress SARS-CoV-2/FLU/RSV plus assay is intended as an aid in the diagnosis of influenza from Nasopharyngeal swab specimens and should not be used as a sole basis for treatment. Nasal washings and aspirates are unacceptable for Xpert Xpress SARS-CoV-2/FLU/RSV testing.  Fact Sheet for Patients: EntrepreneurPulse.com.au  Fact Sheet for Healthcare Providers: IncredibleEmployment.be  This test is not yet approved or cleared by the [place] FDA and has been authorized for detection and/or diagnosis of SARS-CoV-2 by FDA under an Emergency Use Authorization (EUA). This EUA will remain in effect (meaning this test can be used) for the duration of the COVID-19 declaration under Section 564(b)(1) of the Act, 21 U.S.C. section 360bbb-3(b)(1), unless the authorization is terminated or revoked.  Performed at  [facility], [address] [place], [place] [address]   MRSA Next Gen by PCR, Nasal     Status: Abnormal   Collection Time: 10/07/21  3:20 AM   Specimen: Nasal Mucosa; Nasal Swab  Result Value Ref Range Status   MRSA by PCR Next Gen DETECTED (A) NOT DETECTED Final    Comment: (NOTE) The GeneXpert MRSA Assay (FDA approved for NASAL specimens only), is one component of a comprehensive MRSA colonization surveillance program. It is not intended to diagnose MRSA infection nor to guide or monitor treatment for MRSA infections. Test performance is not FDA approved in patients less than 28 years old. Performed at [facility],  [place] [address] [place], [place] [address]           Radiology Studies: DG Abd Portable 1V  Result Date: 10/09/2021 CLINICAL DATA:  Abdominal distension. EXAM: PORTABLE ABDOMEN - 1 VIEW COMPARISON:  Oct 06, 2021. FINDINGS: The bowel gas pattern is normal. No radio-opaque calculi or other significant radiographic abnormality are seen. IMPRESSION: Negative. Electronically Signed   By: Marijo Conception M.D.   On: 10/09/2021 08:59        Scheduled Meds:  apixaban  5 mg Oral BID   atorvastatin  10 mg Oral Daily   bisoprolol  5 mg Oral Daily   budesonide  0.25 mg Nebulization BID   chlorhexidine  15 mL Mouth Rinse BID   Chlorhexidine Gluconate Cloth  6 each Topical Q0600   doxazosin  2 mg Oral Daily   famotidine  20 mg Oral BID   fluticasone  1 spray Each Nare Daily   ipratropium-albuterol  3 mL Nebulization BID   mouth rinse  15 mL Mouth Rinse q12n4p   mometasone-formoterol  2 puff Inhalation BID   mupirocin ointment  1 application. Nasal BID   pantoprazole  40 mg Oral Daily   polyethylene glycol  17 g Oral BID   tamsulosin  0.4 mg Oral QPC supper   Continuous Infusions:     LOS: 3 days      Bonnell Public, MD [facility] Available via Epic secure chat 7am-7pm After these hours, please refer to  coverage provider listed on [email or web address] 10/10/2021, 5:32 PM

## 2021-10-11 DIAGNOSIS — N189 Chronic kidney disease, unspecified: Secondary | ICD-10-CM | POA: Diagnosis not present

## 2021-10-11 DIAGNOSIS — N179 Acute kidney failure, unspecified: Secondary | ICD-10-CM | POA: Diagnosis not present

## 2021-10-11 LAB — RENAL FUNCTION PANEL
Albumin: 3.3 g/dL — ABNORMAL LOW (ref 3.5–5.0)
Anion gap: 8 (ref 5–15)
BUN: 34 mg/dL — ABNORMAL HIGH (ref 8–23)
CO2: 39 mmol/L — ABNORMAL HIGH (ref 22–32)
Calcium: 9 mg/dL (ref 8.9–10.3)
Chloride: 92 mmol/L — ABNORMAL LOW (ref 98–111)
Creatinine, Ser: 1.54 mg/dL — ABNORMAL HIGH (ref 0.61–1.24)
GFR, Estimated: 50 mL/min — ABNORMAL LOW (ref >60)
Glucose, Bld: 119 mg/dL — ABNORMAL HIGH (ref 70–99)
Phosphorus: 3.5 mg/dL (ref 2.5–4.6)
Potassium: 3.5 mmol/L (ref 3.5–5.1)
Sodium: 139 mmol/L (ref 135–145)

## 2021-10-11 LAB — MAGNESIUM: Magnesium: 2.3 mg/dL (ref 1.7–2.4)

## 2021-10-11 MED ORDER — POTASSIUM CHLORIDE CRYS ER 20 MEQ PO TBCR: 20.0000 meq | EXTENDED_RELEASE_TABLET | Freq: Once | ORAL | Status: DC

## 2021-10-11 MED ORDER — POTASSIUM CHLORIDE CRYS ER 20 MEQ PO TBCR
20.0000 meq | EXTENDED_RELEASE_TABLET | Freq: Once | ORAL | Status: AC
  Administered 2021-10-11: 20 meq via ORAL
  Filled 2021-10-11: qty 1

## 2021-10-11 MED ORDER — POLYETHYLENE GLYCOL 3350 17 G PO PACK: 17.0000 g | PACK | Freq: Two times a day (BID) | ORAL | 0 refills | Status: DC

## 2021-10-11 MED ORDER — MUPIROCIN 2 % EX OINT
1.0000 | TOPICAL_OINTMENT | Freq: Two times a day (BID) | CUTANEOUS | 0 refills | Status: DC
Start: 2021-10-11 — End: 2023-01-11

## 2021-10-11 MED ORDER — PANTOPRAZOLE SODIUM 40 MG PO TBEC: 40.0000 mg | DELAYED_RELEASE_TABLET | Freq: Every day | ORAL | 0 refills | Status: DC

## 2021-10-11 NOTE — Discharge Summary (Incomplete)
Physician Discharge Summary  Patient ID: Zachary Hawkins MRN: [id number] DOB/AGE: Jul 22, 1955 66 y.o.  Admit date: 10/06/2021 Discharge date: 10/11/2021  Admission Diagnoses:  Discharge Diagnoses:  Principal Problem:   Acute kidney injury superimposed on chronic kidney disease (HCC) Active Problems:   Essential hypertension   OSA (obstructive sleep apnea)   Paroxysmal atrial fibrillation (HCC)   Chronic respiratory failure with hypercapnia (HCC)   BPH (benign prostatic hyperplasia)   Hyperlipidemia   Class 3 obesity (HCC)   Chronic diastolic heart failure (HCC)   SIRS (systemic inflammatory response syndrome) (HCC)   ARF (acute renal failure) (HCC)   Discharged Condition: stable  Hospital Course: Patient is a 66 year old African-American male, morbidly obese, past medical history significant for OSA, 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  dCHF, CKD 3, HTN, HLD.  A.Fib on eliquis.  Pt recently admitted in early April for acute on chronic hypercapnic failure due to opiate OD (unintentional), as well as CHF exacerbation.  Patient has since been on significant amount of torsemide.  Patient was admitted with constipation, shortness of breath, abdominal pain as significant hypokalemia.  Worsening renal function with also noted.  Hypokalemia persists.  Renal function is improving.  We will repeat renal panel and magnesium today.  Continue to supplement potassium.  Constipation has resolved significantly.  Shortness of breath is improving.  Abdominal pain has resolved.  Likely, patient will be discharged back home tomorrow.    Acute kidney injury superimposed on chronic kidney disease (HCC) -Think that dehydration (Overdiuresis) is cause of todays AKI on CKD. Suspect recent increases in demedex may be responsible (looks like demedex dose increased after early April admit, then increased again earlier this month) Looks like baseline creat ~ 1.5 Pt dry wt = 325lbs following last admit-  daily weights Note also the 20:1 BUN:Cr ratio c/w pre-renal pattern. -Hold diuretics for 24 more hours? -Strict intake and output -No obstruction  11/06/2021: Acute kidney injury is slowly improving.  Continue to monitor renal function and electrolytes.   SIRS (systemic inflammatory response syndrome) (HCC) Fever and tachycardia on presentation to ED today.  No source found -? Viral as procalcitonin low   Hypokalemia -replete aggressively -Repeat renal panel and magnesium today. Replace.   Chronic diastolic heart failure (HCC) Hold diuretics given AKI.   Chronic respiratory failure with hypercapnia (HCC) From COPD + OSA / HVOS On BIPAP at night per DC summary from April.  Says he has been using this more since then. Cont home nebs for COPD   Paroxysmal atrial fibrillation (HCC) Continue eliquis Cont bisoprolol   Class 3 obesity (HCC) Estimated body mass index is 43.69 kg/m as calculated from the following:   Height as of this encounter: 6\' 1"  (1.854 m).   Weight as of this encounter: 150.2 kg.      Hyperlipidemia - statin   BPH (benign prostatic hyperplasia) -flomax   OSA (obstructive sleep apnea) BIPAP QHS   Essential hypertension -hold norvas    Consults: None  Significant Diagnostic Studies: labs: Potassium was low.  Potassium prior to discharge was 3.5.  Please continue to monitor renal function, electrolytes and replete accordingly.  Discharge Exam: Blood pressure 111/84, pulse 93, temperature 97.9 F (36.6 C), resp. rate (!) 25, height 6\' 1"  (1.854 m), weight (!) 150.2 kg, SpO2 100 %.   Disposition: Discharge disposition: 01-Home or Self Care       Discharge Instructions     Diet - low sodium heart healthy   Complete by:  As directed    Increase activity slowly   Complete by: As directed       Allergies as of 10/11/2021       Reactions   Ace Inhibitors Anaphylaxis   Black Cherry Fruit Extract [name]] Anaphylaxis   Tongue    Grenadine Flavor [flavoring Agent] Anaphylaxis        Medication List     STOP taking these medications    amLODipine 5 MG tablet Commonly known as: NORVASC   spironolactone 25 MG tablet Commonly known as: ALDACTONE   torsemide 100 MG tablet Commonly known as: DEMADEX       TAKE these medications    Advair HFA 115-21 MCG/ACT inhaler Generic drug: fluticasone-salmeterol INHALE 2 PUFFS INTO THE LUNGS 2 (TWO) TIMES DAILY.   albuterol 108 (90 Base) MCG/ACT inhaler Commonly known as: VENTOLIN HFA Inhale 2 puffs into the lungs every 4 (four) hours as needed for wheezing or shortness of breath.   apixaban 5 MG Tabs tablet Commonly known as: ELIQUIS Take 1 tablet (5 mg total) by mouth 2 (two) times daily.   atorvastatin 10 MG tablet Commonly known as: LIPITOR Take 10 mg by mouth daily.   bisoprolol 5 MG tablet Commonly known as: ZEBETA Take 1 tablet (5 mg total) by mouth daily.   budesonide 0.25 MG/2ML nebulizer solution Commonly known as: PULMICORT Take 2 mLs (0.25 mg total) by nebulization 2 (two) times daily.   doxazosin 2 MG tablet Commonly known as: CARDURA Take 1 tablet (2 mg total) by mouth daily.   EPINEPHrine 0.3 mg/0.3 mL Soaj injection Commonly known as: EPI-PEN Inject 0.3 mg into the muscle as needed for anaphylaxis. What changed: when to take this   famotidine 20 MG tablet Commonly known as: PEPCID Take 1 tablet (20 mg total) by mouth 2 (two) times daily.   Farxiga 10 MG Tabs tablet Generic drug: dapagliflozin propanediol Take 1 tablet (10 mg total) by mouth daily.   fluticasone 50 MCG/ACT nasal spray Commonly known as: FLONASE Place 1 spray into both nostrils daily.   mupirocin ointment 2 % Commonly known as: BACTROBAN Place 1 application. into the nose 2 (two) times daily.   pantoprazole 40 MG tablet Commonly known as: PROTONIX Take 1 tablet (40 mg total) by mouth daily.   polyethylene glycol 17 g packet Commonly known as: MIRALAX /  GLYCOLAX Take 17 g by mouth 2 (two) times daily.   saline Gel Place 1 application into both nostrils at bedtime. What changed:  when to take this reasons to take this   tamsulosin 0.4 MG Caps capsule Commonly known as: FLOMAX Take 1 capsule (0.4 mg total) by mouth daily after supper.         SignedBarnetta Chapel 10/11/2021, 11:55 AM

## 2021-10-11 NOTE — Progress Notes (Signed)
AVS given to patient and explained at the bedside. Medications and follow up appointments have been explained with pt verbalizing understanding.  

## 2021-10-12 ENCOUNTER — Telehealth: Payer: Self-pay | Admitting: Cardiovascular Disease

## 2021-10-12 LAB — CULTURE, BLOOD (ROUTINE X 2)
Culture: NO GROWTH
Culture: NO GROWTH
Special Requests: ADEQUATE
Special Requests: ADEQUATE

## 2021-10-12 NOTE — Telephone Encounter (Signed)
Spoke with patient. His [facility] PCP provider team is with him at present. Spoke with provider who has written Rx for spironolactone 25mg  daily (per orders from Dr. ) and she had planned to start him off at a lower dose torsemide 20mg  daily until visit with cardiology.   Scheduled for APP visit on 6/8 with [facility]

## 2021-10-12 NOTE — Telephone Encounter (Signed)
Please restart spironolactone 25 mg daily and torsemide 40 mg daily (if he has 20 mg tabs) or 50 mg daily (if he has 100 mg tabs, half of one of those). Please arrange a TOC follow up w APP.

## 2021-10-12 NOTE — Telephone Encounter (Signed)
Spoke with Therapist, sports) who works with patient at PCP office - under care of [employer] PA  He was discharged with AKI - torsemide, spironolactone stopped He sees PCP today  She wanted to update Dr. Sallyanne Kuster on discharge plans given CHF and discontinuation of diuretics She said PCP will not touch these meds as cardiology generally prescribes/adjust  Will send to MD/RN to review  Crystal asked for call back if MD wanted to change anything

## 2021-10-12 NOTE — Telephone Encounter (Signed)
Patient's PCP is calling about patient diagnosis of CHF.Patient was recently discharged from hospital and all his fluid pills was stopped. Please call back to discuss

## 2021-10-12 NOTE — Telephone Encounter (Signed)
That's OK if we get him in in a week or so.

## 2021-10-13 ENCOUNTER — Ambulatory Visit: Payer: Medicaid Other

## 2021-10-13 NOTE — Progress Notes (Signed)
Cardiology Office Note:    Date:  10/15/2021   ID:  Zachary Hawkins Jan 21, 1956, MRN [id number]  PCP:  Dorise Hiss, [place] Cardiologist: Sanda Klein, MD  HF Cardiologist: Dr. Aundra Dubin  Reason for visit: Hospital follow-up  History of Present Illness:    Zachary Hawkins is a 66 y.o. male with a hx of  chronic diastolic CHF, OSA on CPAP, tobacco use, CKD stage III, HTN, obesity, and paroxysmal atrial fibrillation.  She is also noted to have ongoing compliance issues.  Admitted 03/2022 with A/C hypoxic/hypercarbic respiratory failure and volume overload. Treated with antibiotics steroids, nebs and diuretics.    Admitted 04/29/22 with increased shortness of breath in the setting of HF exacerbation. Diuresed with IV lasix and transitioned to lasix 80 mg twice a day   Evaluated in the ED 05/2021 with COPD exacerbation. Treated with nebulizers and steroids.    Admitted 06/23/21 with A/C respiratory failure and heart exacerbation. Placed on Bipap with improvement. Diuresed with IV lasix and transitioned to torsemide 100 mg daily. D/C weigth 344 pounds. Discharged 06/26/21 .   Seen in [facility] 3/23, volume up, likely in setting of high salt intake. Instructed to take extra torsemide 50 mg daily.    Admitted 07/17/21 with a/c CHF, diuresed with IV lasix. Discharged home, weight 338 lbs.  Seen by heart failure clinic August 05, 2021 with Allena Katz NP.  Patient was short of breath walking short distances on flat ground.  Euvolemic on torsemide 100 mg q am/50 mg q pm.  Admitted 4/7-4/14/23 with shortness of breath and saturations in the 50s.  Diuresed with IV Lasix -discharged on torsemide 80 mg daily.  Discharge weight 325 pounds.  Encouraged to wear BiPAP.  He saw Dr. Verlee Monte (pulm) Sep 17, 2021.  Patient was above his dry weight.  Torsemide is increased to 100 mg daily.  Admitted 5/30-10/09/2021 with constipation, shortness of breath and abdominal pain.  Thought to  have dehydration/AKI from overdiuresis.  New baseline creatinine around 1.5.  Discharge weight 150 kg.  Amlodipine, spironolactone and torsemide were discontinued.   Patient called 6/5 regarding cardiac meds that were stopped during hospitalization from AKI.  Dr. Sallyanne Kuster recommended -restart spironolactone 25 mg daily and torsemide 40 mg daily (if he has 20 mg tabs) or 50 mg daily (if he has 100 mg tabs, half of one of those).    Today patient comes to his appointment alone.  Patient states he feels like a delicate flower with frequent admissions for heart failure and COPD exacerbations.  He finds it frustrating that we have not found a balance with his heart failure.  Home health care comes out 3 days a week and weighs him and puts his pills in a pill pack.  I spoke with the home health nurse who has been following him since November.    His pill pack was rearranged after home health nurse talked to Dr. Victorino December office on 6/5 -patient was started on torsemide 20 mg and spironolactone 25 mg - patient started this yesterday.  Today, patient states breathing is okay.  He is wearing 3 L of oxygen.  He has occasional chest tightness which is improved by inhaler.  He sleeps on his side at about 45 degree angle.  Denies PND.  Has bloating.  No significant lower extremity edema.  His weight has been around 345 pounds.  Denies palpitations.  It is very difficult to determine his dry weight.  I was able to  reach his home health nurse that says when he was admitted with AKI and dehydration, his weight was 335 pounds and his breathing was good.  She states patient appears compliant with medications but he has not done well with fluid restriction.  Patient has been going to rehab but sometimes feels too tired to go.   Past Medical History:  Diagnosis Date   Asthma    BPH (benign prostatic hyperplasia)    Cardiomyopathy ([place]) 06/2017   EF 40-45%   CHF (congestive heart failure) (HCC)    Chronic renal  insufficiency, stage 3 (moderate) (HCC)    COPD (chronic obstructive pulmonary disease) (HCC)    Diastolic dysfunction [id number]   grade 2    Hyperlipidemia    Hypertension    Hypertensive urgency    Obesity    Obesity    OSA (obstructive sleep apnea)    Paroxysmal atrial fibrillation ([place]) 2022   on Eliquis    No past surgical history on file.  Current Medications: Current Meds  Medication Sig   ADVAIR HFA 115-21 MCG/ACT inhaler INHALE 2 PUFFS INTO THE LUNGS 2 (TWO) TIMES DAILY.   albuterol (VENTOLIN HFA) 108 (90 Base) MCG/ACT inhaler Inhale 2 puffs into the lungs every 4 (four) hours as needed for wheezing or shortness of breath.   apixaban (ELIQUIS) 5 MG TABS tablet Take 1 tablet (5 mg total) by mouth 2 (two) times daily.   atorvastatin (LIPITOR) 10 MG tablet Take 10 mg by mouth daily.   bisoprolol (ZEBETA) 5 MG tablet Take 1 tablet (5 mg total) by mouth daily.   budesonide (PULMICORT) 0.25 MG/2ML nebulizer solution Take 2 mLs (0.25 mg total) by nebulization 2 (two) times daily.   dapagliflozin propanediol (FARXIGA) 10 MG TABS tablet Take 1 tablet (10 mg total) by mouth daily.   doxazosin (CARDURA) 2 MG tablet Take 1 tablet (2 mg total) by mouth daily.   EPINEPHrine 0.3 mg/0.3 mL IJ SOAJ injection Inject 0.3 mg into the muscle as needed for anaphylaxis. (Patient taking differently: Inject 0.3 mg into the muscle once as needed for anaphylaxis.)   famotidine (PEPCID) 20 MG tablet Take 1 tablet (20 mg total) by mouth 2 (two) times daily.   fluticasone (FLONASE) 50 MCG/ACT nasal spray Place 1 spray into both nostrils daily.   mupirocin ointment (BACTROBAN) 2 % Place 1 application. into the nose 2 (two) times daily.   pantoprazole (PROTONIX) 40 MG tablet Take 1 tablet (40 mg total) by mouth daily.   polyethylene glycol (MIRALAX / GLYCOLAX) 17 g packet Take 17 g by mouth 2 (two) times daily.   saline (AYR) GEL Place 1 application into both nostrils at bedtime. (Patient taking differently:  Place 1 application. into both nostrils at bedtime as needed (for congestion).)   spironolactone (ALDACTONE) 25 MG tablet Take 25 mg by mouth daily.   tamsulosin (FLOMAX) 0.4 MG CAPS capsule Take 1 capsule (0.4 mg total) by mouth daily after supper.   Torsemide 40 MG TABS Take 40 mg by mouth daily.     Allergies:   Ace inhibitors, Black cherry fruit extract Marcelline Mates extract], and Naval architect agent]   Social History   Socioeconomic History   Marital status: Single    Spouse name: Not on file   Number of children: Not on file   Years of education: Not on file   Highest education level: Not on file  Occupational History   Not on file  Tobacco Use   Smoking status: Former  Packs/day: 1.00    Years: 36.00    Total pack years: 36.00    Types: Cigarettes    Quit date: 01/08/2021    Years since quitting: 0.7   Smokeless tobacco: Never  Vaping Use   Vaping Use: Never used  Substance and Sexual Activity   Alcohol use: Yes    Alcohol/week: 1.0 standard drink of alcohol    Types: 1 Shots of liquor per week    Comment: socially   Drug use: Yes    Types: Marijuana    Comment: every other week   Sexual activity: Not on file  Other Topics Concern   Not on file  Social History Narrative   Not on file   Social Determinants of Health   Financial Resource Strain: High Risk (07/15/2021)   Overall Financial Resource Strain (CARDIA)    Difficulty of Paying Living Expenses: Very hard  Food Insecurity: No Food Insecurity (07/15/2021)   Hunger Vital Sign    Worried About Running Out of Food in the Last Year: Never true    Ran Out of Food in the Last Year: Never true  Transportation Needs: No Transportation Needs (07/15/2021)   PRAPARE - Administrator, Civil ServiceTransportation    Lack of Transportation (Medical): No    Lack of Transportation (Non-Medical): No  Physical Activity: Not on file  Stress: Not on file  Social Connections: Not on file     Family History: The patient's family history  includes Diabetes in his father and mother.  ROS:   Please see the history of present illness.     EKGs/Labs/Other Studies Reviewed:    Recent Labs: 04/29/2021: TSH 0.962 10/06/2021: ALT 15; B Natriuretic Peptide 8.5 10/08/2021: Hemoglobin 13.2; Platelets 221 10/11/2021: BUN 34; Creatinine, Ser 1.54; Magnesium 2.3; Potassium 3.5; Sodium 139   Recent Lipid Panel Lab Results  Component Value Date/Time   CHOL 189 07/11/2020 11:03 AM   TRIG 116 08/15/2021 06:25 AM   HDL 49 07/11/2020 11:03 AM   LDLCALC 109 (H) 07/11/2020 11:03 AM    Physical Exam:    VS:  BP 110/80 (BP Location: Left Arm)   Pulse 82   Ht 6\' 1"  (1.854 m)   Wt (!) 345 lb 9.6 oz (156.8 kg)   SpO2 97%   BMI 45.60 kg/m    No data found.  Wt Readings from Last 3 Encounters:  10/15/21 (!) 345 lb 9.6 oz (156.8 kg)  10/10/21 (!) 331 lb 2.1 oz (150.2 kg)  09/17/21 (!) 346 lb (156.9 kg)     GEN: Obese, wearing oxygen HEENT: Normal NECK: No obvious JVD; No carotid bruits CARDIAC: RRR, no murmurs, rubs, gallops RESPIRATORY:  Clear to auscultation without rales, wheezing or rhonchi  ABDOMEN: Distended MUSCULOSKELETAL: Trace edema; No deformity  SKIN: Warm and dry NEUROLOGIC:  Alert and oriented   ASSESSMENT AND PLAN   Acute on chronic diastolic heart failure, slightly hypervolemic today -Echo EF 60-65%. RV normal . Grade IDD -I estimate his dry weight around 340 pounds. -Follows with advanced heart failure clinic -Clarified Dr. Erin Hearingroitoru's recommendation that he be on torsemide 40 mg daily - HH will adjust his pill pack. -Continue spironolactone 25 mg daily. -Continue Farxiga 10 mg daily. -Allergy to ACE inhibitors. -Check BMET today with recent AKI.  Baseline creatinine around 1.5.  Paroxysmal atrial fibrillation -Continue beta-blocker therapy for rate control -Continue Eliquis for stroke prevention  Hypertension, well controlled -Medications as above.  Hyperlipidemia -Continue Lipitor 10 mg  daily.  Obesity -Would benefit from weight loss.  Disposition - Follow-up with heart failure clinic later this month.  Has follow-up with Dr. Sallyanne Kuster next month.   Medication Adjustments/Labs and Tests Ordered: Current medicines are reviewed at length with the patient today.  Concerns regarding medicines are outlined above.  Orders Placed This Encounter  Procedures   Basic metabolic panel   Brain natriuretic peptide   No orders of the defined types were placed in this encounter.   Patient Instructions  Medication Instructions:  Increase Torsemide to 40 mg ( Take 1 Tablet Daily). *If you need a refill on your cardiac medications before your next appointment, please call your pharmacy*   Lab Work: BMET, BNP Today. If you have labs (blood work) drawn today and your tests are completely normal, you will receive your results only by: [place] (if you have MyChart) OR A paper copy in the mail If you have any lab test that is abnormal or we need to change your treatment, we will call you to review the results.   Testing/Procedures: No Testing   Follow-Up: At [facility], you and your health needs are our priority.  As part of our continuing mission to provide you with exceptional heart care, we have created designated Provider Care Teams.  These Care Teams include your primary Cardiologist (physician) and Advanced Practice Providers (APPs -  Physician Assistants and Nurse Practitioners) who all work together to provide you with the care you need, when you need it.  We recommend signing up for the patient portal called "MyChart".  Sign up information is provided on this After Visit Summary.  MyChart is used to connect with patients for Virtual Visits (Telemedicine).  Patients are able to view lab/test results, encounter notes, upcoming appointments, etc.  Non-urgent messages can be sent to your provider as well.   To learn more about what you can do with MyChart, go to  [email or web address].    Your next appointment:   Keep Scheduled Appointment  The format for your next appointment:   In Person  Provider:   Sanda Klein, MD     Other Instructions Dry Weight Estimate 340 pounds. Bring Daily Weight Log and medications to next Appointment.  Important Information About Sugar        Signed, Gaston Islam  10/15/2021 1:37 PM    [facility]

## 2021-10-14 NOTE — Therapy (Incomplete)
OUTPATIENT PHYSICAL THERAPY TREATMENT NOTE   Patient Name: Zachary Hawkins MRN: [id number] DOB:1956/04/07, 66 y.o., male Today's Date: 10/14/2021  PCP: Dorise Hiss, PA-C REFERRING PROVIDER: Nita Sells, MD  END OF SESSION:         Past Medical History:  Diagnosis Date   Asthma    BPH (benign prostatic hyperplasia)    Cardiomyopathy ([place]) 06/2017   EF 40-45%   CHF (congestive heart failure) (HCC)    Chronic renal insufficiency, stage 3 (moderate) (HCC)    COPD (chronic obstructive pulmonary disease) ([place])    Diastolic dysfunction 64/4034   grade 2    Hyperlipidemia    Hypertension    Hypertensive urgency    Obesity    Obesity    OSA (obstructive sleep apnea)    Paroxysmal atrial fibrillation ([place]) 2022   on Eliquis   No past surgical history on file. Patient Active Problem List   Diagnosis Date Noted   ARF (acute renal failure) ([place]) 10/07/2021   SIRS (systemic inflammatory response syndrome) ([place]) 10/06/2021   Acute hypoxemic respiratory failure ([place]) 08/14/2021   Acute encephalopathy    Chronic diastolic heart failure (HCC)    Chronic obstructive pulmonary disease with acute exacerbation (HCC)    Hyperkalemia    Class 3 obesity ([place]) 07/18/2021   Acute on chronic diastolic heart failure (HCC) 07/17/2021   BPH (benign prostatic hyperplasia)    Hyperlipidemia    Acute on chronic respiratory failure with hypoxia and hypercapnia (HCC) 06/23/2021   Chronic diastolic CHF (congestive heart failure) ([place]) 06/23/2021   COPD with acute exacerbation ([place]) 06/23/2021   Acute on chronic diastolic (congestive) heart failure ([place]) 04/30/2021   AKI (acute kidney injury) ([place]) 04/29/2021   Acute exacerbation of CHF (congestive heart failure) ([place]) 04/29/2021   Chronic respiratory failure with hypercapnia ([place]) 04/29/2021   Bilateral hydrocele 04/29/2021   Dyspnea on exertion 04/29/2021   Acute respiratory failure with hypoxia ([place]) 03/10/2021    Urticaria 01/15/2021   Chronic heart failure with preserved ejection fraction (HCC)    Angioedema 01/04/2021   Influenza vaccine refused 06/26/2020   COVID-19 vaccine series completed 06/26/2020   Paroxysmal atrial fibrillation ([place]) 07/02/2019   Secondary hypercoagulable state ([place]) 07/02/2019   OSA (obstructive sleep apnea) 04/03/2019   History of angioedema due to ACE INHIBITORS 10/13/2017   Non compliance w medication regimen 07/13/2017   Essential hypertension 06/23/2017   Acute kidney injury superimposed on chronic kidney disease ([place])    Cardiomyopathy ([place]) 06/06/2017   Dyspnea 06/05/2017    REFERRING DIAG: Unsteadiness on feet (R26.81);Other abnormalities of gait and mobility (R26.89);Muscle weakness (generalized) (M62.81)  THERAPY DIAG: Muscle weakness (generalized)   Difficulty in walking, not elsewhere classified   Other abnormalities of gait and mobility   Unsteadiness on feet   PERTINENT HISTORY: COPD with chronic hypoxemic respiratory failure, diastolic heart failure, HTN, paroxysmal fibrillation, CKD, morbid obesity  PRECAUTIONS: Fall, respiratory  SUBJECTIVE: *** Pt presents to PT noting that he is very fatigued as he had trouble finding his phone before his appointment. He has been compliant with his HEP with no adverse effect. Pt is ready to begin PT at this time.   PAIN:  Are you having pain?  No : NPRS scale: ***/10 currently (10/10 in morning) Pain location: low back Pain description: ache  Aggravating factors: activity Relieving factors: movement    OBJECTIVE: (objective measures completed at initial evaluation unless otherwise dated)   VITALS: (10/01/2021) - during session  SpO2: 92%-99% on 3L  HR: 88-118bpm  BP L UE sitting 113/79   DIAGNOSTIC FINDINGS:            N/A   LE MMT:   MMT Right 09/02/2021 Left 09/02/2021  Hip flexion  3+/5 3+/5  Hip extension      Hip abduction 3+/5 3+/5  Hip adduction 3+/5 3+/5  Hip external  rotation      Hip internal rotation      Knee extension 4/5 4/5  Knee flexion 4/5 4/5  Ankle dorsiflexion       Ankle plantarflexion      Ankle inversion      Ankle eversion      Grossly      (Blank rows = not tested)   FUNCTIONAL TESTS:  30 Second Sit to Stand: 5 reps - 10/01/2021 2MWT: 254f - 10/01/2021 (no change)   GAIT: Distance walked: 2540fAssistive device utilized: Single point cane and supplemental O2 Level of assistance: CGA Comments: very dyspneic with longer distance, decreased gait speed    TODAY'S TREATMENT: [facility] Adult PT Treatment:                                                DATE: 10/15/2021 Therapeutic Exercise: Nustep 4 min L4 (O2 sats pre ***%, ***% post) STS with OH reach 2x5 from table Seated row 2x15 black TB Seated horizontal abd 2x15 GTB LAQ 2x10 BIL 4# Seated marching 4# 2x10 BIL Standing hip abd/extension x 10 Seated bicep curl 2x20 7# each   [facility] Adult PT Treatment:                                                DATE: 10/01/2021 Therapeutic Exercise: STS with OH reach 2x5 from table Seated row 2x15 black TB Seated horizontal abd 2x15 GTB LAQ 2x10 4# Standing hip abd/extension x 10 Seated bicep curl 2x20 7# each  Therapeutic Activity: Assessment of tests/measures, goals, and outcomes  [facility] Adult PT Treatment:                                                DATE: 09/29/2021 Therapeutic Exercise: Nustep 4 min L4 (O2 sats pre 95%, 92% post) STS with OH reach 2x5 from table Seated row 2x15 black TB Standing hip extension x 10 Seated hamstring stretch x 30" R Seated calf stretch x 30" R  [facility] Adult PT Treatment:                                                DATE: 09/24/2021 Therapeutic Exercise: Nustep 7 min L1 (O2 sats pre 96%, 89% post) Self Care: Close monitoring of Vss during session as well as discussion regarding weight gain and need to monitor and notify MD if outside acceptable parameters.   PATIENT EDUCATION:  Education details:  continue HEP Person educated: Patient Education method: Explanation, Demonstration, and Handouts Education comprehension: verbalized understanding and returned demonstration  HOME EXERCISE PROGRAM: Access Code: [place] URL: [email or web address] Date: 09/29/2021 Prepared by: Octavio Manns  Exercises - Sit to Stand  - 2-3 x daily - 7 x weekly - 2 sets - 10 reps - Standing Hip Abduction with Counter Support  - 2-3 x daily - 7 x weekly - 2 sets - 10 reps - Standing Hip Extension with Counter Support  - 1 x daily - 7 x weekly - 3 sets - 10 reps - Standing Shoulder Row with Anchored Resistance  - 1 x daily - 7 x weekly - 3 sets - 10 reps - Seated Calf Stretch with Strap  - 1 x daily - 7 x weekly - 2-3 reps - 20--30 sec hold - Seated Hamstring Stretch  - 1 x daily - 7 x weekly - 2-3 reps - 20-30 sec hold   ASSESSMENT:   CLINICAL IMPRESSION: ***  Pt was able to complete prescribed exercises with no adverse effect on continued supplemental O2. Therapy focused on improving functional mobility and endurance while monitoring vitals. His HR and SpO2 continue to recover more quickly compared to initial evaluation. His 30 Second Sit to Stand did improve by one, showing slight increase in functional mobility. His ambulation tolerance and distance is relatively the same, showing he would continue to benefit from skilled PT services working on improving strength and functional activity tolerance. Will continue to progress as able per POC.   OBJECTIVE IMPAIRMENTS decreased activity tolerance, decreased balance, decreased endurance, decreased mobility, difficulty walking, decreased ROM, decreased strength, and pain.    ACTIVITY LIMITATIONS cleaning, community activity, driving, laundry, and yard work.    PERSONAL FACTORS Fitness, Time since onset of injury/illness/exacerbation, and 3+ comorbidities: COPD with chronic hypoxemic respiratory failure, diastolic heart failure, HTN, paroxysmal  fibrillation, CKD, morbid obesity  are also affecting patient's functional outcome.    GOALS: Goals reviewed with patient? No   SHORT TERM GOALS: Target date: 09/23/2021   Pt will be compliant and knowledgeable with initial HEP for improved comfort and carryover Baseline: initial HEP given  Goal status: MET   2.  Pt will be able to stand >7mn for improved activity tolerance and ADL performance Baseline: 1-2 min Goal status: ONGOING   LONG TERM GOALS: Target date: 10/27/2021   Pt will improve reps in 30 Second Sit to Stand to no less than 7 for improved balance and functional mobility Baseline: 4 reps 10/01/2021: 5 reps Goal status: ONGOING   2.  Pt will be able to ambulate 4042fduring 2MWT for improved functional endurance and safety with community navigation Baseline: 21052f/25/2023: [id number] status: ONGOING   3.  Pt will be able to perform >10 min of standing activity for improved activity tolerance and functional ability to perform ADLs Baseline: 1-2 min Goal status: ONGOING   4.  Pt will increase hip MMT to no less than 4/5 for all tested motions for improved strength and functional mobility Baseline: see chart  Goal status: ONGOING   PLAN: PT FREQUENCY: 2x/week   PT DURATION: 6 weeks   PLANNED INTERVENTIONS: Therapeutic exercises, Therapeutic activity, Neuromuscular re-education, Balance training, Gait training, Patient/Family education, Joint mobilization, Aquatic Therapy, Electrical stimulation, Cryotherapy, Moist heat, and Manual therapy   PLAN FOR NEXT SESSION: assess exercise response, progress HEP, monitor Vss, encourage energy conservation and proper breathing techniques, begin to assess goals progress, f/u on fatigue, elevated HR and weight gain.    StepEvelene CroonA 10/14/2021, 2:34 PM

## 2021-10-15 ENCOUNTER — Ambulatory Visit: Payer: Medicaid Other

## 2021-10-15 ENCOUNTER — Encounter: Payer: Self-pay | Admitting: Physician Assistant

## 2021-10-15 ENCOUNTER — Ambulatory Visit (INDEPENDENT_AMBULATORY_CARE_PROVIDER_SITE_OTHER): Payer: Medicaid Other | Admitting: Physician Assistant

## 2021-10-15 VITALS — BP 110/80 | HR 82 | Ht 73.0 in | Wt 345.6 lb

## 2021-10-15 DIAGNOSIS — I5032 Chronic diastolic (congestive) heart failure: Secondary | ICD-10-CM

## 2021-10-15 DIAGNOSIS — I480 Paroxysmal atrial fibrillation: Secondary | ICD-10-CM

## 2021-10-15 NOTE — Patient Instructions (Signed)
Medication Instructions:  Increase Torsemide to 40 mg ( Take 1 Tablet Daily). *If you need a refill on your cardiac medications before your next appointment, please call your pharmacy*   Lab Work: BMET, BNP Today. If you have labs (blood work) drawn today and your tests are completely normal, you will receive your results only by: MyChart Message (if you have MyChart) OR A paper copy in the mail If you have any lab test that is abnormal or we need to change your treatment, we will call you to review the results.   Testing/Procedures: No Testing   Follow-Up: At [facility], you and your health needs are our priority.  As part of our continuing mission to provide you with exceptional heart care, we have created designated Provider Care Teams.  These Care Teams include your primary Cardiologist (physician) and Advanced Practice Providers (APPs -  Physician Assistants and Nurse Practitioners) who all work together to provide you with the care you need, when you need it.  We recommend signing up for the patient portal called "MyChart".  Sign up information is provided on this After Visit Summary.  MyChart is used to connect with patients for Virtual Visits (Telemedicine).  Patients are able to view lab/test results, encounter notes, upcoming appointments, etc.  Non-urgent messages can be sent to your provider as well.   To learn more about what you can do with MyChart, go to [email or web address].    Your next appointment:   Keep Scheduled Appointment  The format for your next appointment:   In Person  Provider:   Thurmon Fair, MD     Other Instructions Dry Weight Estimate 340 pounds. Bring Daily Weight Log and medications to next Appointment.  Important Information About Sugar

## 2021-10-16 ENCOUNTER — Telehealth: Payer: Self-pay | Admitting: Physician Assistant

## 2021-10-16 ENCOUNTER — Other Ambulatory Visit: Payer: Self-pay

## 2021-10-16 LAB — BASIC METABOLIC PANEL
BUN/Creatinine Ratio: 12 (ref 10–24)
BUN: 16 mg/dL (ref 8–27)
CO2: 29 mmol/L (ref 20–29)
Calcium: 8.8 mg/dL (ref 8.6–10.2)
Chloride: 102 mmol/L (ref 96–106)
Creatinine, Ser: 1.35 mg/dL — ABNORMAL HIGH (ref 0.76–1.27)
Glucose: 121 mg/dL — ABNORMAL HIGH (ref 70–99)
Potassium: 4.7 mmol/L (ref 3.5–5.2)
Sodium: 146 mmol/L — ABNORMAL HIGH (ref 134–144)
eGFR: 59 mL/min/{1.73_m2} — ABNORMAL LOW (ref >59)

## 2021-10-16 LAB — BRAIN NATRIURETIC PEPTIDE: BNP: 55.9 pg/mL (ref 0.0–100.0)

## 2021-10-16 MED ORDER — TORSEMIDE 40 MG PO TABS: 40.0000 mg | ORAL_TABLET | Freq: Every day | ORAL | 3 refills | Status: DC

## 2021-10-16 NOTE — Telephone Encounter (Signed)
Pt c/o medication issue:  1. Name of Medication: Torsemide 40 MG TABS  2. How are you currently taking this medication (dosage and times per day)? 1 tablet daily  3. Are you having a reaction (difficulty breathing--STAT)? no  4. What is your medication issue? [facility] requesting the new prescription for the torsemide be sent to his pharmacy. She states it was increased form 20 mg to 40 mg but he cannot increase the tablets himself because he has a bubble pack. She states the pharmacy is [facility].

## 2021-10-16 NOTE — Telephone Encounter (Signed)
Sent the new prescription into [facility]. Attempted to call [facility]. No answer. Unable to leave a message.

## 2021-10-20 ENCOUNTER — Ambulatory Visit: Payer: Medicaid Other

## 2021-10-20 NOTE — Therapy (Incomplete)
OUTPATIENT PHYSICAL THERAPY TREATMENT NOTE   Patient Name: Zachary Hawkins MRN: [id number] DOB:[birth year], 66 y.o., male Today's Date: 10/20/2021  PCP: Dorise Hiss, PA-C REFERRING PROVIDER: Nita Sells, MD  END OF SESSION:         Past Medical History:  Diagnosis Date   Asthma    BPH (benign prostatic hyperplasia)    Cardiomyopathy ([place]) 06/2017   EF 40-45%   CHF (congestive heart failure) (HCC)    Chronic renal insufficiency, stage 3 (moderate) (HCC)    COPD (chronic obstructive pulmonary disease) ([place])    Diastolic dysfunction 21/1155   grade 2    Hyperlipidemia    Hypertension    Hypertensive urgency    Obesity    Obesity    OSA (obstructive sleep apnea)    Paroxysmal atrial fibrillation ([place]) 2022   on Eliquis   No past surgical history on file. Patient Active Problem List   Diagnosis Date Noted   ARF (acute renal failure) ([place]) 10/07/2021   SIRS (systemic inflammatory response syndrome) ([place]) 10/06/2021   Acute hypoxemic respiratory failure ([place]) 08/14/2021   Acute encephalopathy    Chronic diastolic heart failure (HCC)    Chronic obstructive pulmonary disease with acute exacerbation (HCC)    Hyperkalemia    Class 3 obesity ([place]) 07/18/2021   Acute on chronic diastolic heart failure (HCC) 07/17/2021   BPH (benign prostatic hyperplasia)    Hyperlipidemia    Acute on chronic respiratory failure with hypoxia and hypercapnia (HCC) 06/23/2021   Chronic diastolic CHF (congestive heart failure) ([place]) 06/23/2021   COPD with acute exacerbation ([place]) 06/23/2021   Acute on chronic diastolic (congestive) heart failure ([place]) 04/30/2021   AKI (acute kidney injury) ([place]) 04/29/2021   Acute exacerbation of CHF (congestive heart failure) ([place]) 04/29/2021   Chronic respiratory failure with hypercapnia ([place]) 04/29/2021   Bilateral hydrocele 04/29/2021   Dyspnea on exertion 04/29/2021   Acute respiratory failure with hypoxia ([place]) 03/10/2021    Urticaria 01/15/2021   Chronic heart failure with preserved ejection fraction (HCC)    Angioedema 01/04/2021   Influenza vaccine refused 06/26/2020   COVID-19 vaccine series completed 06/26/2020   Paroxysmal atrial fibrillation ([place]) 07/02/2019   Secondary hypercoagulable state ([place]) 07/02/2019   OSA (obstructive sleep apnea) 04/03/2019   History of angioedema due to ACE INHIBITORS 10/13/2017   Non compliance w medication regimen 07/13/2017   Essential hypertension 06/23/2017   Acute kidney injury superimposed on chronic kidney disease ([place])    Cardiomyopathy ([place]) 06/06/2017   Dyspnea 06/05/2017    REFERRING DIAG: Unsteadiness on feet (R26.81);Other abnormalities of gait and mobility (R26.89);Muscle weakness (generalized) (M62.81)  THERAPY DIAG: Muscle weakness (generalized)   Difficulty in walking, not elsewhere classified   Other abnormalities of gait and mobility   Unsteadiness on feet   PERTINENT HISTORY: COPD with chronic hypoxemic respiratory failure, diastolic heart failure, HTN, paroxysmal fibrillation, CKD, morbid obesity  PRECAUTIONS: Fall, respiratory  SUBJECTIVE:  ***  PAIN:  Are you having pain?  No : NPRS scale: 0/10 currently (10/10 in morning) Pain location: low back Pain description: ache  Aggravating factors: activity Relieving factors: movement    OBJECTIVE: (objective measures completed at initial evaluation unless otherwise dated)   VITALS: (10/01/2021) - during session           SpO2: 92%-99% on 3L  HR: 88-118bpm  BP L UE sitting 113/79   DIAGNOSTIC FINDINGS:            N/A   LE MMT:  MMT Right 09/02/2021 Left 09/02/2021  Hip flexion  3+/5 3+/5  Hip extension      Hip abduction 3+/5 3+/5  Hip adduction 3+/5 3+/5  Hip external rotation      Hip internal rotation      Knee extension 4/5 4/5  Knee flexion 4/5 4/5  Ankle dorsiflexion       Ankle plantarflexion      Ankle inversion      Ankle eversion      Grossly      (Blank rows  = not tested)   FUNCTIONAL TESTS:  30 Second Sit to Stand: 5 reps - 10/01/2021 2MWT: 258f - 10/01/2021 (no change)   GAIT: Distance walked: 2558fAssistive device utilized: Single point cane and supplemental O2 Level of assistance: CGA Comments: very dyspneic with longer distance, decreased gait speed    TODAY'S TREATMENT: [facility] Adult PT Treatment:                                                DATE: 10/20/2021 Therapeutic Exercise: STS with OH reach 2x5 from table Seated row 2x15 black TB Seated horizontal abd 2x15 GTB LAQ 2x10 4# Standing hip abd/extension x 10 Seated bicep curl 2x20 7# each  Therapeutic Activity: Assessment of tests/measures, goals, and outcomes  [facility] Adult PT Treatment:                                                DATE: 10/01/2021 Therapeutic Exercise: STS with OH reach 2x5 from table Seated row 2x15 black TB Seated horizontal abd 2x15 GTB LAQ 2x10 4# Standing hip abd/extension x 10 Seated bicep curl 2x20 7# each  Therapeutic Activity: Assessment of tests/measures, goals, and outcomes  [facility] Adult PT Treatment:                                                DATE: 09/29/2021 Therapeutic Exercise: Nustep 4 min L4 (O2 sats pre 95%, 92% post) STS with OH reach 2x5 from table Seated row 2x15 black TB Standing hip extension x 10 Seated hamstring stretch x 30" R Seated calf stretch x 30" R  [facility] Adult PT Treatment:                                                DATE: 09/24/2021 Therapeutic Exercise: Nustep 7 min L1 (O2 sats pre 96%, 89% post) Self Care: Close monitoring of Vss during session as well as discussion regarding weight gain and need to monitor and notify MD if outside acceptable parameters.   PATIENT EDUCATION:  Education details: continue HEP Person educated: Patient Education method: Explanation, Demonstration, and Handouts Education comprehension: verbalized understanding and returned demonstration     HOME EXERCISE PROGRAM: Access  Code: [email or web address] URL: [email or web address] Date: 09/29/2021 Prepared by: DaOctavio MannsExercises - Sit to Stand  - 2-3 x daily - 7 x weekly - 2 sets - 10 reps - Standing Hip Abduction with Counter Support  -  2-3 x daily - 7 x weekly - 2 sets - 10 reps - Standing Hip Extension with Counter Support  - 1 x daily - 7 x weekly - 3 sets - 10 reps - Standing Shoulder Row with Anchored Resistance  - 1 x daily - 7 x weekly - 3 sets - 10 reps - Seated Calf Stretch with Strap  - 1 x daily - 7 x weekly - 2-3 reps - 20--30 sec hold - Seated Hamstring Stretch  - 1 x daily - 7 x weekly - 2-3 reps - 20-30 sec hold   ASSESSMENT:   CLINICAL IMPRESSION: ***   OBJECTIVE IMPAIRMENTS decreased activity tolerance, decreased balance, decreased endurance, decreased mobility, difficulty walking, decreased ROM, decreased strength, and pain.    ACTIVITY LIMITATIONS cleaning, community activity, driving, laundry, and yard work.    PERSONAL FACTORS Fitness, Time since onset of injury/illness/exacerbation, and 3+ comorbidities: COPD with chronic hypoxemic respiratory failure, diastolic heart failure, HTN, paroxysmal fibrillation, CKD, morbid obesity  are also affecting patient's functional outcome.    GOALS: Goals reviewed with patient? No   SHORT TERM GOALS: Target date: 09/23/2021   Pt will be compliant and knowledgeable with initial HEP for improved comfort and carryover Baseline: initial HEP given  Goal status: MET   2.  Pt will be able to stand >42mn for improved activity tolerance and ADL performance Baseline: 1-2 min Goal status: ONGOING   LONG TERM GOALS: Target date: 10/27/2021   Pt will improve reps in 30 Second Sit to Stand to no less than 7 for improved balance and functional mobility Baseline: 4 reps 10/01/2021: 5 reps Goal status: ONGOING   2.  Pt will be able to ambulate 4041fduring 2MWT for improved functional endurance and safety with community navigation Baseline:  21078f/25/2023: 212f57fal status: ONGOING   3.  Pt will be able to perform >10 min of standing activity for improved activity tolerance and functional ability to perform ADLs Baseline: 1-2 min Goal status: ONGOING   4.  Pt will increase hip MMT to no less than 4/5 for all tested motions for improved strength and functional mobility Baseline: see chart  Goal status: ONGOING   PLAN: PT FREQUENCY: 2x/week   PT DURATION: 6 weeks   PLANNED INTERVENTIONS: Therapeutic exercises, Therapeutic activity, Neuromuscular re-education, Balance training, Gait training, Patient/Family education, Joint mobilization, Aquatic Therapy, Electrical stimulation, Cryotherapy, Moist heat, and Manual therapy   PLAN FOR NEXT SESSION: assess exercise response, progress HEP, monitor Vss, encourage energy conservation and proper breathing techniques, begin to assess goals progress, f/u on fatigue, elevated HR and weight gain.   Check all possible CPT codes: [id number]-evaluation, 97110- Therapeutic Exercise, [id number] Re-education, [phone] Training, [phone] Therapy, 97530 - Therapeutic Activities, and 97535 - Self Care                                    If treatment provided at initial evaluation, no treatment charged due to lack of authorization.                        DaviWard Chatters 10/20/2021, 9:32 AM

## 2021-10-21 ENCOUNTER — Telehealth: Payer: Self-pay

## 2021-10-21 NOTE — Telephone Encounter (Addendum)
Called patient regarding results. Left detailed message for patient advising lab are normal to continue torsemide.----- Message from Cannon Kettle, PA-C sent at 10/20/2021 10:19 PM EDT ----- Kidney function improved/back to baseline.  Potassium normal.    BNP normal indicating that you are at a normal volume status.  Weight at your office appt was 345 lbs.  Continue your torsemide.

## 2021-10-22 ENCOUNTER — Ambulatory Visit: Payer: Medicaid Other

## 2021-10-22 ENCOUNTER — Telehealth: Payer: Self-pay

## 2021-10-22 NOTE — Telephone Encounter (Signed)
PT called and spoke with patient and he has missed his last few appointments with same day cancellations. After discussion, PT thought it would be best to hold on therapy until he has his hospital follow up visit with MD on 10/30/2021  Patient in agreement with current plan, will continue with current HEP and monitor O2 sats at home.   Eloy End   10/22/21 6:01 PM

## 2021-10-27 ENCOUNTER — Ambulatory Visit: Payer: Medicaid Other

## 2021-10-29 ENCOUNTER — Ambulatory Visit: Payer: Medicaid Other

## 2021-10-30 ENCOUNTER — Ambulatory Visit (HOSPITAL_COMMUNITY)
Admission: RE | Admit: 2021-10-30 | Discharge: 2021-10-30 | Disposition: A | Discharge status: Home / Self Care | Payer: Medicaid Other | Source: Ambulatory Visit | Attending: Family Medicine | Admitting: Family Medicine

## 2021-10-30 ENCOUNTER — Encounter (HOSPITAL_COMMUNITY): Payer: Self-pay

## 2021-10-30 VITALS — BP 114/76 | HR 84 | Wt 346.4 lb

## 2021-10-30 DIAGNOSIS — Z6841 Body Mass Index (BMI) 40.0 and over, adult: Secondary | ICD-10-CM | POA: Insufficient documentation

## 2021-10-30 DIAGNOSIS — I130 Hypertensive heart and chronic kidney disease with heart failure and stage 1 through stage 4 chronic kidney disease, or unspecified chronic kidney disease: Secondary | ICD-10-CM | POA: Diagnosis not present

## 2021-10-30 DIAGNOSIS — Z79899 Other long term (current) drug therapy: Secondary | ICD-10-CM | POA: Diagnosis not present

## 2021-10-30 DIAGNOSIS — Z87891 Personal history of nicotine dependence: Secondary | ICD-10-CM | POA: Insufficient documentation

## 2021-10-30 DIAGNOSIS — J9612 Chronic respiratory failure with hypercapnia: Secondary | ICD-10-CM

## 2021-10-30 DIAGNOSIS — Z9981 Dependence on supplemental oxygen: Secondary | ICD-10-CM | POA: Insufficient documentation

## 2021-10-30 DIAGNOSIS — Z9989 Dependence on other enabling machines and devices: Secondary | ICD-10-CM | POA: Diagnosis not present

## 2021-10-30 DIAGNOSIS — G4733 Obstructive sleep apnea (adult) (pediatric): Secondary | ICD-10-CM

## 2021-10-30 DIAGNOSIS — Z7901 Long term (current) use of anticoagulants: Secondary | ICD-10-CM | POA: Diagnosis not present

## 2021-10-30 DIAGNOSIS — I5032 Chronic diastolic (congestive) heart failure: Secondary | ICD-10-CM | POA: Diagnosis present

## 2021-10-30 DIAGNOSIS — J9611 Chronic respiratory failure with hypoxia: Secondary | ICD-10-CM | POA: Insufficient documentation

## 2021-10-30 DIAGNOSIS — I480 Paroxysmal atrial fibrillation: Secondary | ICD-10-CM | POA: Insufficient documentation

## 2021-10-30 DIAGNOSIS — Z7984 Long term (current) use of oral hypoglycemic drugs: Secondary | ICD-10-CM | POA: Diagnosis not present

## 2021-10-30 DIAGNOSIS — N1831 Chronic kidney disease, stage 3a: Secondary | ICD-10-CM | POA: Diagnosis not present

## 2021-10-30 DIAGNOSIS — E669 Obesity, unspecified: Secondary | ICD-10-CM | POA: Diagnosis not present

## 2021-10-30 DIAGNOSIS — J441 Chronic obstructive pulmonary disease with (acute) exacerbation: Secondary | ICD-10-CM | POA: Insufficient documentation

## 2021-10-30 DIAGNOSIS — Z91198 Patient's noncompliance with other medical treatment and regimen for other reason: Secondary | ICD-10-CM | POA: Diagnosis not present

## 2021-10-30 MED ORDER — BISOPROLOL FUMARATE 5 MG PO TABS: 2.5000 mg | ORAL_TABLET | Freq: Every day | ORAL | 3 refills | Status: DC

## 2021-11-16 NOTE — Discharge Summary (Signed)
Physician Discharge Summary  Patient ID: DEAGEN Zachary Hawkins MRN: [id number] DOB/AGE: July 18, 1955 66 y.o.  Admit date: 10/06/2021 Discharge date: 11/16/2021  Admission Diagnoses:  Discharge Diagnoses:  Principal Problem:   Acute kidney injury superimposed on chronic kidney disease (HCC) Active Problems:   Essential hypertension   OSA (obstructive sleep apnea)   Paroxysmal atrial fibrillation (HCC)   Chronic respiratory failure with hypercapnia (HCC)   BPH (benign prostatic hyperplasia)   Hyperlipidemia   Class 3 obesity (HCC)   Chronic diastolic heart failure (HCC)   SIRS (systemic inflammatory response syndrome) (HCC)   ARF (acute renal failure) (HCC)   Discharged Condition: stable  Hospital Course: Patient is a 66 year old African-American male, morbidly obese, past medical history significant for OSA, 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  dCHF, CKD 3, HTN, HLD.  A.Fib on eliquis.  Patient was recently admitted in early April for acute on chronic hypercapnic failure due to opiate OD (unintentional), as well as CHF exacerbation.  Patient had since been on significant amount of torsemide.  Patient was admitted with constipation, shortness of breath, abdominal pain and significant hypokalemia.  Worsening renal function with also noted.  Patient was admitted for further assessment and management electrolytes were monitored and repleted.  Acute kidney injury was thought to be likely prerenal.  Apparently, patient has been on diuretics.  Diuretics were held during the hospital stay.  AKI-improving.  Other chronic problems are managed expectantly.  Shortness of breath has improved significantly.  Abdominal pain has resolved.  Constipation has resolved.  Patient be discharged.    Acute kidney injury superimposed on chronic kidney disease (HCC) -Think that dehydration (Overdiuresis) is cause of todays AKI on CKD. Suspect recent increases in demedex may be responsible (looks like demedex  dose increased after early April admit, then increased again earlier this month) Looks like baseline creat ~ 1.5 Pt dry wt = 325lbs following last admit- daily weights Note also the 20:1 BUN:Cr ratio c/w pre-renal pattern. -Hold diuretics for 24 more hours? -Strict intake and output -No obstruction  11/06/2021: Acute kidney injury is slowly improving.  Continue to monitor renal function and electrolytes.   SIRS (systemic inflammatory response syndrome) (HCC) Fever and tachycardia on presentation to ED today.  No source found -? Viral as procalcitonin low   Hypokalemia -replete aggressively -Repeat renal panel and magnesium today. Replace.   Chronic diastolic heart failure (HCC) Hold diuretics given AKI.   Chronic respiratory failure with hypercapnia (HCC) From COPD + OSA / HVOS On BIPAP at night per DC summary from April.  Says he has been using this more since then. Cont home nebs for COPD   Paroxysmal atrial fibrillation (HCC) Continue eliquis Cont bisoprolol   Class 3 obesity (HCC) Estimated body mass index is 43.69 kg/m as calculated from the following:   Height as of this encounter: 6\' 1"  (1.854 m).   Weight as of this encounter: 150.2 kg.      Hyperlipidemia - statin   BPH (benign prostatic hyperplasia) -flomax   OSA (obstructive sleep apnea) BIPAP QHS   Essential hypertension -hold norvas  Consults: None   Discharge Exam: Blood pressure 111/84, pulse 93, temperature 97.9 F (36.6 C), resp. rate (!) 25, height 6\' 1"  (1.854 m), weight (!) 150.2 kg, SpO2 100 %.   Disposition: Discharge disposition: 01-Home or Self Care       Discharge Instructions     Diet - low sodium heart healthy   Complete by: As directed  Increase activity slowly   Complete by: As directed       Allergies as of 10/11/2021       Reactions   Ace Inhibitors Anaphylaxis   Black Cherry Fruit Extract [name]] Anaphylaxis   Tongue   Grenadine Flavor [flavoring  Agent] Anaphylaxis        Medication List     STOP taking these medications    amLODipine 5 MG tablet Commonly known as: NORVASC   spironolactone 25 MG tablet Commonly known as: ALDACTONE   torsemide 100 MG tablet Commonly known as: DEMADEX       TAKE these medications    Advair HFA 115-21 MCG/ACT inhaler Generic drug: fluticasone-salmeterol INHALE 2 PUFFS INTO THE LUNGS 2 (TWO) TIMES DAILY.   albuterol 108 (90 Base) MCG/ACT inhaler Commonly known as: VENTOLIN HFA Inhale 2 puffs into the lungs every 4 (four) hours as needed for wheezing or shortness of breath.   apixaban 5 MG Tabs tablet Commonly known as: ELIQUIS Take 1 tablet (5 mg total) by mouth 2 (two) times daily.   atorvastatin 10 MG tablet Commonly known as: LIPITOR Take 10 mg by mouth daily.   budesonide 0.25 MG/2ML nebulizer solution Commonly known as: PULMICORT Take 2 mLs (0.25 mg total) by nebulization 2 (two) times daily.   doxazosin 2 MG tablet Commonly known as: CARDURA Take 1 tablet (2 mg total) by mouth daily.   EPINEPHrine 0.3 mg/0.3 mL Soaj injection Commonly known as: EPI-PEN Inject 0.3 mg into the muscle as needed for anaphylaxis.   famotidine 20 MG tablet Commonly known as: PEPCID Take 1 tablet (20 mg total) by mouth 2 (two) times daily.   Farxiga 10 MG Tabs tablet Generic drug: dapagliflozin propanediol Take 1 tablet (10 mg total) by mouth daily.   fluticasone 50 MCG/ACT nasal spray Commonly known as: FLONASE Place 1 spray into both nostrils daily. What changed:  when to take this reasons to take this   mupirocin ointment 2 % Commonly known as: BACTROBAN Place 1 application. into the nose 2 (two) times daily.   pantoprazole 40 MG tablet Commonly known as: PROTONIX Take 1 tablet (40 mg total) by mouth daily.   polyethylene glycol 17 g packet Commonly known as: MIRALAX / GLYCOLAX Take 17 g by mouth 2 (two) times daily.   saline Gel Place 1 application into both  nostrils at bedtime. What changed:  when to take this reasons to take this   tamsulosin 0.4 MG Caps capsule Commonly known as: FLOMAX Take 1 capsule (0.4 mg total) by mouth daily after supper.         SignedBarnetta Chapel 11/16/2021, 12:01 AM

## 2021-11-19 ENCOUNTER — Ambulatory Visit: Payer: Medicaid Other | Admitting: Cardiovascular Disease

## 2021-11-20 ENCOUNTER — Telehealth: Payer: Self-pay | Admitting: Cardiovascular Disease

## 2021-11-20 ENCOUNTER — Other Ambulatory Visit: Payer: Self-pay

## 2021-11-20 MED ORDER — TORSEMIDE 20 MG PO TABS: 60.0000 mg | ORAL_TABLET | Freq: Every day | ORAL | 0 refills | Status: DC

## 2021-11-20 NOTE — Telephone Encounter (Signed)
Pt c/o swelling: STAT is pt has developed SOB within 24 hours  If swelling, where is the swelling located? Mostly abdominal   How much weight have you gained and in what time span? 8 lb in 4 days  Have you gained 3 pounds in a day or 5 pounds in a week? no  Do you have a log of your daily weights (if so, list)?  7/7-346 7/10 -349 7/14-357   Are you currently taking a fluid pill? Spirolactone 25mg  every morning   Are you currently SOB? no  Have you traveled recently? No  Sharonda from [facility] called stating that the pt has gained 8lb in 4 days. She states that from what she can see the swelling is mostly in his abdomen. She states he is up talking at the moment and he does not have any SOB.

## 2021-11-20 NOTE — Telephone Encounter (Signed)
Spoke with pt's nurse. She states pt has gained 8 pounds in 4 days, denies SOB. "He has been up and talking since I got here." We went over mediations in pt's bubble pack. Torsemide was not in there. Pt looked at the bottle of Torsemide. He could not find it. Spoke with Laural Golden, we looked over pt's chart. Order for Torsemide 60 mg sent to pharmacy. Nurse and pt instructed to have pt take three 20 mg tablets (total 60 mg) once daily, record his weight over the weekend and call us Monday with an update. They all verbalized understanding. According to nurse pt is taking Spirolactone 25 mg daily. His PCP just ordered Ozempic (semagulutide) they are waiting for the PA for that medication. Spirolactone added to his medication list.

## 2021-11-20 NOTE — Progress Notes (Signed)
Spoke with pt's nurse. She states pt has gained 8 pounds in 4 days, denies SOB. "He has been up and talking since I got here." We went over mediations in pt's bubble pack. Torsemide was not in there. Pt looked at the bottle of Torsemide. He could not find it. Spoke with Chris Pavero, we looked over pt's chart. Order for Torsemide 60 mg sent to pharmacy. Nurse and pt instructed to have pt take three 20 mg tablets (total 60 mg) once daily, record his weight over the weekend and call us Monday with an update. They all verbalized understanding. According to nurse pt is taking Spirolactone 25 mg daily. His PCP just ordered Ozempic (semagulutide) they are waiting for the PA for that medication. Spirolactone added to his medication list.  

## 2021-11-23 ENCOUNTER — Encounter (HOSPITAL_COMMUNITY): Payer: Self-pay

## 2021-11-23 ENCOUNTER — Ambulatory Visit (HOSPITAL_COMMUNITY)
Admission: RE | Admit: 2021-11-23 | Discharge: 2021-11-23 | Disposition: A | Discharge status: Home / Self Care | Payer: Medicaid Other | Source: Ambulatory Visit | Attending: Internal Medicine | Admitting: Internal Medicine

## 2021-11-23 ENCOUNTER — Encounter: Payer: Self-pay | Admitting: Cardiovascular Disease

## 2021-11-23 VITALS — BP 146/88 | HR 94 | Wt 361.8 lb

## 2021-11-23 DIAGNOSIS — I130 Hypertensive heart and chronic kidney disease with heart failure and stage 1 through stage 4 chronic kidney disease, or unspecified chronic kidney disease: Secondary | ICD-10-CM | POA: Insufficient documentation

## 2021-11-23 DIAGNOSIS — I480 Paroxysmal atrial fibrillation: Secondary | ICD-10-CM | POA: Insufficient documentation

## 2021-11-23 DIAGNOSIS — E669 Obesity, unspecified: Secondary | ICD-10-CM | POA: Insufficient documentation

## 2021-11-23 DIAGNOSIS — I5033 Acute on chronic diastolic (congestive) heart failure: Secondary | ICD-10-CM | POA: Insufficient documentation

## 2021-11-23 DIAGNOSIS — Z6841 Body Mass Index (BMI) 40.0 and over, adult: Secondary | ICD-10-CM | POA: Insufficient documentation

## 2021-11-23 DIAGNOSIS — Z9981 Dependence on supplemental oxygen: Secondary | ICD-10-CM | POA: Diagnosis not present

## 2021-11-23 DIAGNOSIS — N1831 Chronic kidney disease, stage 3a: Secondary | ICD-10-CM | POA: Insufficient documentation

## 2021-11-23 DIAGNOSIS — Z7984 Long term (current) use of oral hypoglycemic drugs: Secondary | ICD-10-CM | POA: Insufficient documentation

## 2021-11-23 DIAGNOSIS — Z7901 Long term (current) use of anticoagulants: Secondary | ICD-10-CM | POA: Insufficient documentation

## 2021-11-23 DIAGNOSIS — Z79899 Other long term (current) drug therapy: Secondary | ICD-10-CM | POA: Insufficient documentation

## 2021-11-23 DIAGNOSIS — G4733 Obstructive sleep apnea (adult) (pediatric): Secondary | ICD-10-CM | POA: Insufficient documentation

## 2021-11-23 DIAGNOSIS — J9611 Chronic respiratory failure with hypoxia: Secondary | ICD-10-CM | POA: Diagnosis not present

## 2021-11-23 DIAGNOSIS — I5032 Chronic diastolic (congestive) heart failure: Secondary | ICD-10-CM | POA: Diagnosis not present

## 2021-11-23 DIAGNOSIS — J9612 Chronic respiratory failure with hypercapnia: Secondary | ICD-10-CM

## 2021-11-23 LAB — BASIC METABOLIC PANEL
Anion gap: 9 (ref 5–15)
BUN: 18 mg/dL (ref 8–23)
CO2: 35 mmol/L — ABNORMAL HIGH (ref 22–32)
Calcium: 8.9 mg/dL (ref 8.9–10.3)
Chloride: 99 mmol/L (ref 98–111)
Creatinine, Ser: 1.58 mg/dL — ABNORMAL HIGH (ref 0.61–1.24)
GFR, Estimated: 49 mL/min — ABNORMAL LOW (ref >60)
Glucose, Bld: 156 mg/dL — ABNORMAL HIGH (ref 70–99)
Potassium: 4.6 mmol/L (ref 3.5–5.1)
Sodium: 143 mmol/L (ref 135–145)

## 2021-11-23 LAB — BRAIN NATRIURETIC PEPTIDE: B Natriuretic Peptide: 19.5 pg/mL (ref 0.0–100.0)

## 2021-11-23 MED ORDER — POTASSIUM CHLORIDE CRYS ER 20 MEQ PO TBCR: 40.0000 meq | EXTENDED_RELEASE_TABLET | Freq: Every day | ORAL | 0 refills | Status: DC

## 2021-11-23 NOTE — Patient Instructions (Signed)
Thank you for coming in today  Labs were done today, if any labs are abnormal the clinic will call you No news is good news  START Furoscix for 3 days with 40 meq of potassium  PLEASE HOLD Torsemide for 3 days while taking the Furosix ON THURSDAY RESUME TORSEMIDE 60 MG DAILY   Your physician recommends that you schedule a follow-up appointment in:  BACK IN CLINIC NEXT WEEK.    Do the following things EVERYDAY: Weigh yourself in the morning before breakfast. Write it down and keep it in a log. Take your medicines as prescribed Eat low salt foods--Limit salt (sodium) to 2000 mg per day.  Stay as active as you can everyday Limit all fluids for the day to less than 2 liters  At the [facility], you and your health needs are our priority. As part of our continuing mission to provide you with exceptional heart care, we have created designated Provider Care Teams. These Care Teams include your primary Cardiologist (physician) and Advanced Practice Providers (APPs- Physician Assistants and Nurse Practitioners) who all work together to provide you with the care you need, when you need it.   You may see any of the following providers on your designated Care Team at your next follow up: Dr Arvilla Meres Dr Carron Curie, NP Robbie Lis, [place] [facility] [place], [place] Karle Plumber, PharmD   Please be sure to bring in all your medications bottles to every appointment.   If you have any questions or concerns before your next appointment please send [place] a message through [place] or call our office at [phone].    TO LEAVE A MESSAGE FOR THE NURSE SELECT OPTION 2, PLEASE LEAVE A MESSAGE INCLUDING: YOUR NAME DATE OF BIRTH CALL BACK NUMBER REASON FOR CALL**this is important as we prioritize the call backs  YOU WILL RECEIVE A CALL BACK THE SAME DAY AS LONG AS YOU CALL BEFORE 4:00 PM

## 2021-11-23 NOTE — Addendum Note (Signed)
Encounter addended by: Demetrius Charity, RN on: 11/23/2021 4:30 PM  Actions taken: Clinical Note Signed

## 2021-11-23 NOTE — Progress Notes (Signed)
Pt was taught on how to use the Furoscix application, pt was shown demonstration both pt and pt family stated understanding of information given. Pt information to receive the Furoscix and have it delivered to his home. Had to call and get physical address of patient. What was in the system was a [address]. Pt gave me physical address of [address]  [place] [address].  All necessary information was faxed over to Furoscix direct

## 2021-11-23 NOTE — Telephone Encounter (Signed)
Patient seen in the Heart Failure clinic today. Please refer to that note.

## 2021-11-23 NOTE — Progress Notes (Signed)
ADVANCED HF CLINIC NOTE  Primary Care: McVey, Gelene Mink, PA-C Primary Cardiologist: Dr. Sallyanne Kuster HF Cardiologist: Dr. Aundra Dubin  HPI: Zachary Hawkins is a 66 y.o. with a h/o HFpEF, OSA, CPAP, smoker, CKD Stage III, HTN, obesity,and PAF. Has had multiple ED/hospital visit over the last year.  Ongoing compliance issues.    Admitted 03/2022 with A/C hypoxic/hypercarbic respiratory failure and volume overload. Treated with antibiotics steroids, nebs and diuretics.    Admitted 04/29/22 with increased shortness of breath in the setting of HF exacerbation. Diuresed with IV lasix and transitioned to lasix 80 mg twice a day   Evaluated in the ED 05/2021 with COPD exacerbation. Treated with nebulizers and steroids.    Admitted 06/23/21 with A/C respiratory failure and heart exacerbation. Placed on Bipap with improvement. Diuresed with IV lasix and transitioned to torsemide 100 mg daily. D/C weigth 344 pounds. Discharged 06/26/21 .   Seen in [facility] 3/23, volume up, likely in setting of high salt intake. Instructed to take extra torsemide 50 mg daily.   Admitted 07/17/21 with a/c CHF, diuresed with IV lasix. Discharged home, weight 338 lbs.  Post hospital follow up 3/23, NYHA III, volume OK on exam.   Admitted 4/23 with acute on chronic respiratory failure 2/2 CPAP noncompliance and unintentional pain medication overdose. Failed NIV and required intubation. CHF compensated.  Admitted 5/23 with SOB and AKI. GDMT held. Weight down to 325 lbs at discharge, new SCr baseline 1.5. Amlodipine, torsemide and spiro stopped at discharge. Saw general cardiology at follow up and torsemide restarted at 60 mg daily and spiro at 25 mg daily.  Today he returns for HF follow up. Originally scheduled to see pharmacy but weight is up 15 lbs and he is more SOB. Patient arranged to see APP. Overall feeling more SOB. Ran out of torsemide and has not picked up from pharmacy in a few days. Drinking a lot of juice during the day.  Has abdominal swelling. Denies CP, dizziness, palpitations, abnormal bleeding, or PND/Orthopnea. Appetite ok. No fever or chills. Wears 3L oxygen at home. He has [place] EMT 3 days a week who fills pill box and checks vital signs.  ECG (personally reviewed): none ordered today.  Labs (6/23): K 4.7, creatinine 1.35  Cardiac Testing  - Echo (12/22): EF 60-65% RV normal  ROS: All systems reviewed and negative except as per HPI.   Past Medical History:  Diagnosis Date   Asthma    BPH (benign prostatic hyperplasia)    Cardiomyopathy ([place]) 06/2017   EF 40-45%   CHF (congestive heart failure) (HCC)    Chronic renal insufficiency, stage 3 (moderate) (HCC)    COPD (chronic obstructive pulmonary disease) (HCC)    Diastolic dysfunction [id number]   grade 2    Hyperlipidemia    Hypertension    Hypertensive urgency    Obesity    Obesity    OSA (obstructive sleep apnea)    Paroxysmal atrial fibrillation ([place]) 2022   on Eliquis   Current Outpatient Medications  Medication Sig Dispense Refill   ADVAIR HFA 115-21 MCG/ACT inhaler INHALE 2 PUFFS INTO THE LUNGS 2 (TWO) TIMES DAILY. 36 g 12   albuterol (VENTOLIN HFA) 108 (90 Base) MCG/ACT inhaler Inhale 2 puffs into the lungs every 4 (four) hours as needed for wheezing or shortness of breath. 18 g 0   apixaban (ELIQUIS) 5 MG TABS tablet Take 1 tablet (5 mg total) by mouth 2 (two) times daily. 60 tablet 5   atorvastatin (LIPITOR)  10 MG tablet Take 10 mg by mouth daily.     bisoprolol (ZEBETA) 5 MG tablet Take 0.5 tablets (2.5 mg total) by mouth daily. 45 tablet 3   budesonide (PULMICORT) 0.25 MG/2ML nebulizer solution Take 2 mLs (0.25 mg total) by nebulization 2 (two) times daily. 60 mL 12   dapagliflozin propanediol (FARXIGA) 10 MG TABS tablet Take 1 tablet (10 mg total) by mouth daily. 30 tablet 0   doxazosin (CARDURA) 2 MG tablet Take 1 tablet (2 mg total) by mouth daily. 30 tablet 3   EPINEPHrine 0.3 mg/0.3 mL IJ SOAJ injection Inject 0.3  mg into the muscle as needed for anaphylaxis. 1 each 1   famotidine (PEPCID) 20 MG tablet Take 1 tablet (20 mg total) by mouth 2 (two) times daily. 28 tablet 0   fluticasone (FLONASE) 50 MCG/ACT nasal spray Place 1 spray into both nostrils daily. (Patient taking differently: Place 1 spray into both nostrils daily as needed.) 16 g 2   mupirocin ointment (BACTROBAN) 2 % Place 1 application. into the nose 2 (two) times daily. (Patient taking differently: Place 1 application  into the nose daily.) 22 g 0   pantoprazole (PROTONIX) 40 MG tablet Take 1 tablet (40 mg total) by mouth daily. 30 tablet 0   polyethylene glycol (MIRALAX / GLYCOLAX) 17 g packet Take 17 g by mouth 2 (two) times daily. (Patient taking differently: Take 17 g by mouth 2 (two) times daily. As needed) 14 each 0   saline (AYR) GEL Place 1 application into both nostrils at bedtime. (Patient taking differently: Place 1 application  into both nostrils at bedtime as needed (for congestion).) 14.1 g 11   spironolactone (ALDACTONE) 25 MG tablet Take 25 mg by mouth daily.     tamsulosin (FLOMAX) 0.4 MG CAPS capsule Take 1 capsule (0.4 mg total) by mouth daily after supper. 30 capsule 2   torsemide (DEMADEX) 20 MG tablet Take 3 tablets (60 mg total) by mouth daily. 90 tablet 0   No current facility-administered medications for this encounter.   Allergies  Allergen Reactions   Ace Inhibitors Anaphylaxis   Black Cherry Fruit Extract [name]] Anaphylaxis    Tongue   Grenadine Flavor [Flavoring Agent] Anaphylaxis   Social History   Socioeconomic History   Marital status: Single    Spouse name: Not on file   Number of children: Not on file   Years of education: Not on file   Highest education level: Not on file  Occupational History   Not on file  Tobacco Use   Smoking status: Former    Packs/day: 1.00    Years: 36.00    Total pack years: 36.00    Types: Cigarettes    Quit date: 01/08/2021    Years since quitting: 0.8    Smokeless tobacco: Never  Vaping Use   Vaping Use: Never used  Substance and Sexual Activity   Alcohol use: Yes    Alcohol/week: 1.0 standard drink of alcohol    Types: 1 Shots of liquor per week    Comment: socially   Drug use: Yes    Types: Marijuana    Comment: every other week   Sexual activity: Not on file  Other Topics Concern   Not on file  Social History Narrative   Not on file   Social Determinants of Health   Financial Resource Strain: High Risk (07/15/2021)   Overall Financial Resource Strain (CARDIA)    Difficulty of Paying Living Expenses: Very hard  Food Insecurity: No Food Insecurity (07/15/2021)   Hunger Vital Sign    Worried About Running Out of Food in the Last Year: Never true    Ran Out of Food in the Last Year: Never true  Transportation Needs: No Transportation Needs (07/15/2021)   PRAPARE - Hydrologist (Medical): No    Lack of Transportation (Non-Medical): No  Physical Activity: Not on file  Stress: Not on file  Social Connections: Not on file  Intimate Partner Violence: Not on file   Family History  Problem Relation Age of Onset   Diabetes Mother    Diabetes Father    BP (!) 146/88   Pulse 94   Wt (!) 164.1 kg (361 lb 12.8 oz)   SpO2 94%   BMI 47.73 kg/m   Wt Readings from Last 3 Encounters:  11/23/21 (!) 164.1 kg (361 lb 12.8 oz)  10/30/21 (!) 157.1 kg (346 lb 6.4 oz)  10/15/21 (!) 156.8 kg (345 lb 9.6 oz)   PHYSICAL EXAM: General:  NAD. No resp difficulty, arrived in [facility] on oxygen, +conversational dyspnea HEENT: Normal Neck: Supple. Thick neck. Carotids 2+ bilat; no bruits. No lymphadenopathy or thryomegaly appreciated. Cor: PMI nondisplaced. Regular rate & rhythm. No rubs, gallops or murmurs. Lungs: Diminished throughout Abdomen: +distended, nontender, obese No hepatosplenomegaly. No bruits or masses. Good bowel sounds. Extremities: No cyanosis, clubbing, rash, 2+ BLE edema to knees Neuro: Alert & oriented x  3, cranial nerves grossly intact. Moves all 4 extremities w/o difficulty. Affect pleasant.  ASSESSMENT & PLAN: 1. Acute on chronic diastolic heart failure: Echo EF 60-65%. RV normal . Grade IDD. Worse NYHA IIIb, functional status confounded by body habitus and chronic respiratory failure. He is volume overloaded on exam today, weight up 15 lbs. Has been out of torsemide for several days and drinks > 2L juice/day. - Give Furoscix 80 mg + 40 KCL x 3 days. Hold torsemide while using Furoscix. BMET/BNP today. - Resume torsemide 60 mg daily on Thursday. He needs to pick up his torsemide from the pharmacy. - Continue bisoprolol 2.5 mg daily. - Continue Farxiga 10 mg daily. - Continue spironolactone 25 mg daily. - Allergy to ace so not a candidate for arni.   - Discussed low salt food choices and limiting fluid intake to < 2 liter per day.  - ? Cardiomems, but he has Medicaid.  2. PAF: Regular on exam today.  - Continue bisoprolol.  - Continue Eliquis 5 mg bid. No bleeding issues. 3. OSA: Reinforced compliance w/ Bipap.  4. Chronic Respiratory Failure: Wears 2-5 liters.  - Oxygen sats stable. Follows with Pulmonary. 5. Obesity: Body mass index is 47.73 kg/m. - Discussed semaglutide but he would like to try weight loss with diet and exercise. 6. CKD Stage IIIa: Baseline around 1.3-1.6.  - Continue SGLT2i.  - BMET today.   He is at high risk for readmits due to poor insight and ongoing dietary compliance issues.   I discussed Furoscix with Zachary Hawkins's HH EMT, Zachary Hawkins, via phone at today's visit. He understands the med changes and will help administer Furoscix if needed. Patient's neighbor, Zachary Hawkins, was at visit today and can help if needed with administering Furoscix.  Follow up next week with APP. If volume not improved, will likely need admission.    Allena Katz, FNP-BC 11/23/21  FUROSCIX prescribed  Patient viewed patient education video with QR code for Zachary Hawkins code for FUROSCIX  placed on AVS  Call [facility]  Direct at [phone] for questions regarding on body infuser.  Day 1  FUROSCIX 80 mg once daily  via on body infuser + KDUR 40  Day 2  FUROSCIX 80 mg once daily  via on body infuser+ KDUR 40  Day 3 FUROSCIX  80 mg once daily  via on body infuser+ KDUR 40  Hold torsemide while using Furoscix

## 2021-11-25 ENCOUNTER — Telehealth (HOSPITAL_COMMUNITY): Payer: Self-pay | Admitting: Cardiology

## 2021-11-25 NOTE — Telephone Encounter (Signed)
Called to confirm if furoscix was received  Pt report he is to receive fed ex shipment today As of today he has received one infusion Advised to be sure to use infusions as ordered  Reports minimal relief

## 2021-12-01 ENCOUNTER — Telehealth: Payer: Self-pay | Admitting: Student

## 2021-12-02 ENCOUNTER — Encounter (HOSPITAL_COMMUNITY): Payer: Medicaid Other

## 2021-12-02 ENCOUNTER — Telehealth (HOSPITAL_COMMUNITY): Payer: Self-pay | Admitting: Licensed Clinical Social Worker

## 2021-12-02 NOTE — Telephone Encounter (Signed)
CSW contacted by RN case manager with his PCP office, [facility], regarding paramedicine referral.  They confirm that their paramedics are seeing patient 2x week and helping to verify pill packs being sent to patient and check vitals/ reinforce healthy eating and fluid restrictions.  They think pt would benefit from HF Paramedicine program referral to have closer follow up regarding his heart failure as they identify this as his main issue.  CSW sent message to APP provider to get their recommendation- will await response.  Burna Sis, LCSW Clinical Social Worker [facility] Desk#: [phone] Cell#: [phone]

## 2021-12-02 NOTE — Telephone Encounter (Signed)
Zachary Hawkins from [facility] states patient needs order for portable oxygen concentrator. Patient using [employer] for oxygen. Natasha phone number is [phone].

## 2021-12-03 NOTE — Telephone Encounter (Signed)
Called Natasha back at [facility] and advised her that [facility] has not seen patient since May and that at his office visit next month he will need to be walked to see if he needs the POC and if his oxygen drops. Pt is on cpap machine but will need to determine if he needs a poc. Nothing further needed

## 2021-12-04 ENCOUNTER — Encounter (HOSPITAL_COMMUNITY): Payer: Medicaid Other

## 2021-12-04 ENCOUNTER — Other Ambulatory Visit (HOSPITAL_COMMUNITY): Payer: Self-pay

## 2021-12-04 NOTE — Telephone Encounter (Signed)
APP provider reviewed and does not see need for paramedicine enrollment at this time as patient is already active with paramedicine through his PCP office  CSW informed PCP office and provided with triage line for their paramedics to call and discuss any heart failure related concerns  Burna Sis, LCSW Clinical Social Worker [facility] Desk#: [phone] Cell#: [phone]

## 2021-12-05 ENCOUNTER — Other Ambulatory Visit (HOSPITAL_COMMUNITY): Payer: Self-pay

## 2021-12-07 ENCOUNTER — Other Ambulatory Visit (HOSPITAL_COMMUNITY): Payer: Self-pay

## 2021-12-08 ENCOUNTER — Telehealth: Payer: Self-pay | Admitting: Student

## 2021-12-09 NOTE — Telephone Encounter (Signed)
Called and spoke with [place] from [facility] who wanted to know if Dr. Thora Lance would be okay prescribing a POC for pt as the tanks are hard for pt to maneuver and pt is wanting to be able to get out more often.  I stated to her that we could check with Dr. Thora Lance to see if he is okay with this being done but also let her know that the POCs are pulsed flow so we would need to get pt to come in for an appt to have a qualifying walk performed to see if he is able to keep sats stable on the POC due to him currently needing 3L cont.  While stating all this to [place], she stated that she would have to call the office back to further discuss as she was currently in a meeting.  While waiting for her to call back, sending this to Dr. Thora Lance for him to review.

## 2021-12-11 NOTE — Telephone Encounter (Signed)
Called and spoke with Zachary Hawkins letting her know the info from Dr. Thora Lance and she stated that pt's PCP was going to order the POC. Nothing further needed.

## 2021-12-11 NOTE — Telephone Encounter (Signed)
Sure if he's willing to come in for POC walk

## 2021-12-18 ENCOUNTER — Telehealth (HOSPITAL_COMMUNITY): Payer: Self-pay | Admitting: *Deleted

## 2021-12-18 ENCOUNTER — Encounter (HOSPITAL_COMMUNITY): Payer: Self-pay | Admitting: *Deleted

## 2021-12-18 NOTE — Telephone Encounter (Signed)
Pt was ordered Furoscix 7/17, med was approved with $0 copay and shipped to pt on 7/20 per [facility]  Pt no showed for f/u appt on 7/26, have left pt several message to call us our office to f/u on how he is doing, mychart message sent as well

## 2021-12-23 ENCOUNTER — Ambulatory Visit (INDEPENDENT_AMBULATORY_CARE_PROVIDER_SITE_OTHER): Payer: Medicaid Other | Admitting: Student

## 2021-12-23 DIAGNOSIS — R0609 Other forms of dyspnea: Secondary | ICD-10-CM

## 2021-12-23 LAB — PULMONARY FUNCTION TEST
DL/VA % pred: 129 %
DL/VA: 5.3 ml/min/mmHg/L
DLCO cor % pred: 60 %
DLCO cor: 17.79 ml/min/mmHg
DLCO unc % pred: 60 %
DLCO unc: 17.79 ml/min/mmHg
RV % pred: 66 %
RV: 1.66 L
TLC % pred: 59 %
TLC: 4.55 L

## 2021-12-23 NOTE — Patient Instructions (Signed)
DLCO and Pleth performed. 

## 2021-12-23 NOTE — Progress Notes (Signed)
Patient not able to perform spirometry. DLCO and Pleth performed. 

## 2021-12-28 NOTE — Progress Notes (Unsigned)
Synopsis: Referred for decreased O2 saturation, restrictive lung disease, dyspnea by Newt Minion*  Subjective:   PATIENT ID: Zachary Hawkins GENDER: male DOB: August 06, 1955, MRN: [id number]  No chief complaint on file. 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 He was referred to allergy/immunology.   He has some DOE. Taking advair one puff BID. No cough especially since stopping smoking.   He never had any itching during either of the above episodes. Tryptase was negative.   He is finishing course of steroids.   Snores. Some PND. Some daytime sleepiness.   He had an episode yesterday where home oxygen cut off and it worried him.   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   Interval HPI  Since last visit hospitalized twice for acute on chronic hypercapnic hypoxic respiratory failure requiring NIV, intubation in April. Discharged on torsemide 80 daily, spironolactone 25 mg daily, SGLT2i.  He doesn't really have cough.   He thinks he's doing well since leaving hospital. Weight is back up to 346 lb however. Down to 325 lb at end of last hospitalization. Takes all of his medicines in blister pack which pharmacy arranges. Home health nurse is helping him with adherence.   He says he's using trelegy most nights.   Last seen in clinic 5/11 at which point increased torsemide to 100 mg daily. Unfortunately had AKI and was admitted 6/4 - diuretics held during admission. Restarted on torsemide 40-50mg  daily, aldactone 25  mg dai, farxiga 10 mg daily after discharge. New dry weight estimate around 340-345 lb. Increased to torsemide 60 mg daily at last visit with CHF 7/17.  POC prescribed by PCP  Otherwise pertinent review of systems is negative.     Past Medical History:  Diagnosis Date   Asthma    BPH (benign prostatic hyperplasia)    Cardiomyopathy (HCC) 06/2017   EF 40-45%   CHF (congestive heart failure) (HCC)    Chronic renal insufficiency, stage 3 (moderate) (HCC)    COPD (chronic obstructive pulmonary disease) (HCC)    Diastolic dysfunction 06/2017   grade 2    Hyperlipidemia    Hypertension    Hypertensive urgency    Obesity    Obesity    OSA (obstructive sleep apnea)    Paroxysmal atrial fibrillation (HCC) 2022   on Eliquis     Family History  Problem Relation Age of Onset   Diabetes Mother    Diabetes Father      No past surgical history on file.  Social History   Socioeconomic History   Marital status: Single    Spouse name: Not on file   Number of children: Not on file   Years of education: Not on file   Highest education level: Not on file  Occupational History   Not on file  Tobacco Use   Smoking status: Former    Packs/day: 1.00    Years: 36.00    Total pack years: 36.00    Types: Cigarettes    Quit date: 01/08/2021    Years since  quitting: 0.9   Smokeless tobacco: Never  Vaping Use   Vaping Use: Never used  Substance and Sexual Activity   Alcohol use: Yes    Alcohol/week: 1.0 standard drink of alcohol    Types: 1 Shots of liquor per week    Comment: socially   Drug use: Yes    Types: Marijuana    Comment: every other week   Sexual activity: Not on file  Other Topics Concern   Not on file  Social History Narrative   Not on file   Social Determinants of Health   Financial Resource Strain: High Risk (07/15/2021)   Overall Financial Resource Strain (CARDIA)    Difficulty of Paying Living Expenses: Very hard  Food Insecurity: No Food Insecurity  (07/15/2021)   Hunger Vital Sign    Worried About Running Out of Food in the Last Year: Never true    Ran Out of Food in the Last Year: Never true  Transportation Needs: No Transportation Needs (07/15/2021)   PRAPARE - Administrator, Civil Service (Medical): No    Lack of Transportation (Non-Medical): No  Physical Activity: Not on file  Stress: Not on file  Social Connections: Not on file  Intimate Partner Violence: Not on file     Allergies  Allergen Reactions   Ace Inhibitors Anaphylaxis   Black Cherry Fruit Extract [name]] Anaphylaxis    Tongue   Grenadine Flavor [Flavoring Agent] Anaphylaxis     Outpatient Medications Prior to Visit  Medication Sig Dispense Refill   ADVAIR HFA 115-21 MCG/ACT inhaler INHALE 2 PUFFS INTO THE LUNGS 2 (TWO) TIMES DAILY. 36 g 12   albuterol (VENTOLIN HFA) 108 (90 Base) MCG/ACT inhaler Inhale 2 puffs into the lungs every 4 (four) hours as needed for wheezing or shortness of breath. 18 g 0   apixaban (ELIQUIS) 5 MG TABS tablet Take 1 tablet (5 mg total) by mouth 2 (two) times daily. 60 tablet 5   atorvastatin (LIPITOR) 10 MG tablet Take 10 mg by mouth daily.     bisoprolol (ZEBETA) 5 MG tablet Take 0.5 tablets (2.5 mg total) by mouth daily. 45 tablet 3   budesonide (PULMICORT) 0.25 MG/2ML nebulizer solution Use 1 vial (2 mLs) by nebulization 2 times daily. 60 mL 12   dapagliflozin propanediol (FARXIGA) 10 MG TABS tablet Take 1 tablet (10 mg total) by mouth daily. 30 tablet 0   doxazosin (CARDURA) 2 MG tablet Take 1 tablet by mouth daily. 30 tablet 3   EPINEPHrine 0.3 mg/0.3 mL IJ SOAJ injection Inject 0.3 mg into the muscle as needed for anaphylaxis. 1 each 1   famotidine (PEPCID) 20 MG tablet Take 1 tablet (20 mg total) by mouth 2 (two) times daily. 28 tablet 0   fluticasone (FLONASE) 50 MCG/ACT nasal spray Place 1 spray into both nostrils daily. (Patient taking differently: Place 1 spray into both nostrils daily as needed.) 16 g 2    mupirocin ointment (BACTROBAN) 2 % Place 1 application. into the nose 2 (two) times daily. (Patient taking differently: Place 1 application  into the nose daily.) 22 g 0   pantoprazole (PROTONIX) 40 MG tablet Take 1 tablet (40 mg total) by mouth daily. 30 tablet 0   polyethylene glycol (MIRALAX / GLYCOLAX) 17 g packet Take 17 g by mouth 2 (two) times daily. (Patient taking differently: Take 17 g by mouth 2 (two) times daily. As needed) 14 each 0   potassium chloride SA (KLOR-CON M) 20 MEQ tablet  Take 2 tablets (40 mEq total) by mouth daily for 3 days. Take 40 meq daily with Furoscix dose for 3 days only 30 tablet 0   saline (AYR) GEL Place 1 application into both nostrils at bedtime. (Patient taking differently: Place 1 application  into both nostrils at bedtime as needed (for congestion).) 14.1 g 11   spironolactone (ALDACTONE) 25 MG tablet Take 25 mg by mouth daily.     tamsulosin (FLOMAX) 0.4 MG CAPS capsule Take 1 capsule (0.4 mg total) by mouth daily after supper. 30 capsule 2   torsemide (DEMADEX) 20 MG tablet Take 3 tablets (60 mg total) by mouth daily. 90 tablet 0   No facility-administered medications prior to visit.       Objective:   Physical Exam:  General appearance: 66 y.o., male, NAD, conversant  Eyes: anicteric sclerae; PERRL, tracking appropriately HENT: NCAT; MMM Neck: Trachea midline; no lymphadenopathy, no JVD Lungs: distant, no crackles, no wheeze, with normal respiratory effort on 2L O2 CV: RRR, no murmur  Abdomen: Soft, non-tender; non-distended, BS present  Extremities: 1+ nonpitting peripheral edema, warm Skin: Normal turgor and texture; no rash Psych: Appropriate affect Neuro: Alert and oriented to person and place, no focal deficit     There were no vitals filed for this visit.       on 3L BMI Readings from Last 3 Encounters:  11/23/21 47.73 kg/m  10/30/21 45.70 kg/m  10/15/21 45.60 kg/m   Wt Readings from Last 3 Encounters:  11/23/21 (!) 361  lb 12.8 oz (164.1 kg)  10/30/21 (!) 346 lb 6.4 oz (157.1 kg)  10/15/21 (!) 345 lb 9.6 oz (156.8 kg)     CBC    Component Value Date/Time   WBC 4.9 10/08/2021 0353   RBC 4.63 10/08/2021 0353   HGB 13.2 10/08/2021 0353   HGB 16.7 07/11/2020 1103   HCT 43.4 10/08/2021 0353   HCT 50.3 07/11/2020 1103   PLT 221 10/08/2021 0353   PLT 232 07/11/2020 1103   MCV 93.7 10/08/2021 0353   MCV 88 07/11/2020 1103   MCH 28.5 10/08/2021 0353   MCHC 30.4 10/08/2021 0353   RDW 15.3 10/08/2021 0353   RDW 13.6 07/11/2020 1103   LYMPHSABS 1.0 10/06/2021 1818   MONOABS 1.2 (H) 10/06/2021 1818   EOSABS 0.0 10/06/2021 1818   BASOSABS 0.0 10/06/2021 1818   C4 31 IgE 394 C1 inh above normal  Tryptase WNL  Chest Imaging: CXR 01/13/21 reviewed by me and unremarkable  CT A/P 04/29/21 lung bases reviewed by with RLL scar and lower lobe ?cysts   CTA Chest 05/11/21 reviewed by me with bronchial wall thickening, stable scar  CXR 08/17/21 reviewed by me with cardiomegaly, likely RLL atelectasis, possible retrocardiac opacity  Pulmonary Functions Testing Results:    Latest Ref Rng & Units 12/23/2021    1:24 PM  PFT Results  DLCO uncorrected ml/min/mmHg 17.79  P  DLCO UNC% % 60  P  DLCO corrected ml/min/mmHg 17.79  P  DLCO COR %Predicted % 60  P  DLVA Predicted % 129  P  TLC L 4.55  P  TLC % Predicted % 59  P  RV % Predicted % 66  P    P Preliminary result   PFT 12/23/21 reviewed by me with restriction, mildly reduced diffusing capacity with elevated KCO, unable to perform spirometry    Echocardiogram:   TTE 01/08/21 EF 60-65%, G1DD  TTE 04/30/21 essentially normal     Assessment & Plan:   #  Dyspnea on exertion: # OSA # HFpEF # Chronic hypoxic hypercapnic respiratory failure May be multifactorial from deconditioning, diastolic dysfunction (but looks well compensated today), undertreated sleep-disordered breathing, possible asthma/copd. Above dry weight today.   # CRS   # Angioedema:  possible that it is still related to ACEi even years from taking it. Not sure what to make of the elevated C1inh level.  # Smoking: Congratulated him on cessation. Does not qualify for lung cancer screening with <20 py smoking.  Plan: - He is above his dry weight but he can't do weight based dosing of diuretic easily as he depends on combination twice daily blister pack pre-prepared by pharmacy for all of his medications. I have increased his torsemide to 100 mg daily, needs BMP in a week.  - DME supplier is [employer], we will reach out for a download to review trilogy usage - ok to continue pulmicort - we'll try to check PFTs next visit but he syncopized or maybe had a seizure the last time we tried in the setting of acute on chronic hypercapnic respiratory failure when he was volume overloaded and had taken a sedating medication - weight loss encouraged - discuss follow up with allergy for angioedema next visit   RTC 3 months  I spent *** minutes dedicated to the care of this patient on the date of this encounter to include pre-visit review of records, face-to-face time with the patient discussing conditions above including reviewing his medication adherence and technique and tailoring our diuretic strategy to match, post visit ordering of testing, clinical documentation with the electronic health record, and communicating necessary findings to members of the patients care team.     Maryjane Hurter, MD [facility] 12/28/2021 12:38 PM

## 2021-12-29 ENCOUNTER — Ambulatory Visit: Payer: Medicaid Other | Admitting: Student

## 2021-12-29 ENCOUNTER — Encounter: Payer: Self-pay | Admitting: Student

## 2021-12-29 VITALS — BP 132/80 | HR 98 | Ht 73.0 in | Wt 343.0 lb

## 2021-12-29 DIAGNOSIS — J9611 Chronic respiratory failure with hypoxia: Secondary | ICD-10-CM | POA: Diagnosis not present

## 2021-12-29 DIAGNOSIS — R0609 Other forms of dyspnea: Secondary | ICD-10-CM

## 2021-12-29 NOTE — Patient Instructions (Signed)
-   Would stay on same dose of torsemide, aldactone, farxiga - Would stop pulmicort - Would continue advair 2 puffs twice daily with spacer - rinse mouth and spacer after each use - Would recommend using trilogy ventilator with naps and at night as much as you're able to - this will help keep CO2 level in safe range to prevent episodes of confusion - See you in 3 months or sooner if need be!

## 2022-01-18 ENCOUNTER — Other Ambulatory Visit: Payer: Self-pay | Admitting: Student

## 2022-02-02 ENCOUNTER — Other Ambulatory Visit (HOSPITAL_COMMUNITY): Payer: Self-pay | Admitting: Cardiology

## 2022-02-02 ENCOUNTER — Ambulatory Visit (HOSPITAL_COMMUNITY)
Admission: RE | Admit: 2022-02-02 | Discharge: 2022-02-02 | Disposition: A | Discharge status: Home / Self Care | Payer: Medicaid Other | Source: Ambulatory Visit | Attending: Cardiology | Admitting: Cardiology

## 2022-02-02 VITALS — BP 130/80 | HR 98 | Wt 348.2 lb

## 2022-02-02 DIAGNOSIS — N183 Chronic kidney disease, stage 3 unspecified: Secondary | ICD-10-CM | POA: Insufficient documentation

## 2022-02-02 DIAGNOSIS — Z91199 Patient's noncompliance with other medical treatment and regimen due to unspecified reason: Secondary | ICD-10-CM | POA: Insufficient documentation

## 2022-02-02 DIAGNOSIS — G4733 Obstructive sleep apnea (adult) (pediatric): Secondary | ICD-10-CM | POA: Diagnosis not present

## 2022-02-02 DIAGNOSIS — I5032 Chronic diastolic (congestive) heart failure: Secondary | ICD-10-CM | POA: Insufficient documentation

## 2022-02-02 DIAGNOSIS — Z79899 Other long term (current) drug therapy: Secondary | ICD-10-CM | POA: Diagnosis not present

## 2022-02-02 DIAGNOSIS — I480 Paroxysmal atrial fibrillation: Secondary | ICD-10-CM | POA: Insufficient documentation

## 2022-02-02 DIAGNOSIS — E785 Hyperlipidemia, unspecified: Secondary | ICD-10-CM | POA: Insufficient documentation

## 2022-02-02 DIAGNOSIS — Z7901 Long term (current) use of anticoagulants: Secondary | ICD-10-CM | POA: Insufficient documentation

## 2022-02-02 DIAGNOSIS — N529 Male erectile dysfunction, unspecified: Secondary | ICD-10-CM | POA: Insufficient documentation

## 2022-02-02 DIAGNOSIS — Z87891 Personal history of nicotine dependence: Secondary | ICD-10-CM | POA: Diagnosis not present

## 2022-02-02 DIAGNOSIS — Z6841 Body Mass Index (BMI) 40.0 and over, adult: Secondary | ICD-10-CM | POA: Insufficient documentation

## 2022-02-02 DIAGNOSIS — E669 Obesity, unspecified: Secondary | ICD-10-CM | POA: Insufficient documentation

## 2022-02-02 DIAGNOSIS — J441 Chronic obstructive pulmonary disease with (acute) exacerbation: Secondary | ICD-10-CM | POA: Insufficient documentation

## 2022-02-02 DIAGNOSIS — Z7984 Long term (current) use of oral hypoglycemic drugs: Secondary | ICD-10-CM | POA: Insufficient documentation

## 2022-02-02 DIAGNOSIS — I130 Hypertensive heart and chronic kidney disease with heart failure and stage 1 through stage 4 chronic kidney disease, or unspecified chronic kidney disease: Secondary | ICD-10-CM | POA: Insufficient documentation

## 2022-02-02 LAB — CBC
HCT: 46.6 % (ref 39.0–52.0)
Hemoglobin: 13.8 g/dL (ref 13.0–17.0)
MCH: 27.8 pg (ref 26.0–34.0)
MCHC: 29.6 g/dL — ABNORMAL LOW (ref 30.0–36.0)
MCV: 94 fL (ref 80.0–100.0)
Platelets: 264 10*3/uL (ref 150–400)
RBC: 4.96 MIL/uL (ref 4.22–5.81)
RDW: 15.6 % — ABNORMAL HIGH (ref 11.5–15.5)
WBC: 7.9 10*3/uL (ref 4.0–10.5)
nRBC: 0 % (ref 0.0–0.2)

## 2022-02-02 LAB — LIPID PANEL
Cholesterol: 142 mg/dL (ref 0–200)
HDL: 47 mg/dL (ref >40)
LDL Cholesterol: 52 mg/dL (ref 0–99)
Total CHOL/HDL Ratio: 3 RATIO
Triglycerides: 213 mg/dL — ABNORMAL HIGH (ref <150)
VLDL: 43 mg/dL — ABNORMAL HIGH (ref 0–40)

## 2022-02-02 LAB — BASIC METABOLIC PANEL
Anion gap: 8 (ref 5–15)
BUN: 18 mg/dL (ref 8–23)
CO2: 37 mmol/L — ABNORMAL HIGH (ref 22–32)
Calcium: 9.2 mg/dL (ref 8.9–10.3)
Chloride: 96 mmol/L — ABNORMAL LOW (ref 98–111)
Creatinine, Ser: 1.39 mg/dL — ABNORMAL HIGH (ref 0.61–1.24)
GFR, Estimated: 57 mL/min — ABNORMAL LOW (ref >60)
Glucose, Bld: 99 mg/dL (ref 70–99)
Potassium: 4.1 mmol/L (ref 3.5–5.1)
Sodium: 141 mmol/L (ref 135–145)

## 2022-02-02 MED ORDER — DOXAZOSIN MESYLATE 2 MG PO TABS: 2.0000 mg | ORAL_TABLET | Freq: Every day | ORAL | 3 refills | Status: DC

## 2022-02-02 MED ORDER — APIXABAN 5 MG PO TABS: 5.0000 mg | ORAL_TABLET | Freq: Two times a day (BID) | ORAL | 11 refills | Status: DC

## 2022-02-02 MED ORDER — ATORVASTATIN CALCIUM 10 MG PO TABS: 10.0000 mg | ORAL_TABLET | Freq: Every day | ORAL | 3 refills | Status: DC

## 2022-02-02 MED ORDER — DAPAGLIFLOZIN PROPANEDIOL 10 MG PO TABS: 10.0000 mg | ORAL_TABLET | Freq: Every day | ORAL | 11 refills | Status: DC

## 2022-02-02 MED ORDER — POTASSIUM CHLORIDE CRYS ER 20 MEQ PO TBCR: 40.0000 meq | EXTENDED_RELEASE_TABLET | Freq: Every day | ORAL | 3 refills | Status: DC

## 2022-02-02 MED ORDER — BISOPROLOL FUMARATE 5 MG PO TABS: 2.5000 mg | ORAL_TABLET | Freq: Every day | ORAL | 3 refills | Status: DC

## 2022-02-02 MED ORDER — TORSEMIDE 20 MG PO TABS: 60.0000 mg | ORAL_TABLET | Freq: Every day | ORAL | 3 refills | Status: DC

## 2022-02-02 MED ORDER — SPIRONOLACTONE 25 MG PO TABS: 25.0000 mg | ORAL_TABLET | Freq: Every day | ORAL | 3 refills | Status: DC

## 2022-02-02 NOTE — Patient Instructions (Signed)
Increase Torsemide to 60mg  daily  Labs done today, your results will be available in MyChart, we will contact you for abnormal readings.  Repeat blood work in 10 days  Your physician recommends that you schedule a follow-up appointment in: 1 month  If you have any questions or concerns before your next appointment please send [place] a message through [place] or call our office at [phone].    TO LEAVE A MESSAGE FOR THE NURSE SELECT OPTION 2, PLEASE LEAVE A MESSAGE INCLUDING: YOUR NAME DATE OF BIRTH CALL BACK NUMBER REASON FOR CALL**this is important as we prioritize the call backs  YOU WILL RECEIVE A CALL BACK THE SAME DAY AS LONG AS YOU CALL BEFORE 4:00 PM  At the [facility], you and your health needs are our priority. As part of our continuing mission to provide you with exceptional heart care, we have created designated Provider Care Teams. These Care Teams include your primary Cardiologist (physician) and Advanced Practice Providers (APPs- Physician Assistants and Nurse Practitioners) who all work together to provide you with the care you need, when you need it.   You may see any of the following providers on your designated Care Team at your next follow up: Dr Glori Bickers Dr Loralie Champagne Dr. Roxana Hires, NP Lyda Jester, [place] [facility], [place] Forestine Na, NP Audry Riles, PharmD   Please be sure to bring in all your medications bottles to every appointment.

## 2022-02-03 NOTE — Progress Notes (Signed)
ADVANCED HF CLINIC NOTE  Primary Care: McVey, Gelene Mink, PA-C Primary Cardiologist: Dr. Sallyanne Kuster HF Cardiologist: Dr. Aundra Dubin  HPI: [name] is a 66 y.o. with a h/o HFpEF, OSA, CPAP, smoker, CKD Stage III, HTN, obesity,and PAF. Has had multiple ED/hospital visits over the last year.  Ongoing compliance issues.    Admitted 03/2022 with A/C hypoxic/hypercarbic respiratory failure and volume overload. Treated with antibiotics steroids, nebs and diuretics.    Admitted 04/29/22 with increased shortness of breath in the setting of HF exacerbation. Diuresed with IV lasix and transitioned to lasix 80 mg twice a day   Evaluated in the ED 05/2021 with COPD exacerbation. Treated with nebulizers and steroids.    Admitted 06/23/21 with A/C respiratory failure and heart exacerbation. Placed on Bipap with improvement. Diuresed with IV lasix and transitioned to torsemide 100 mg daily. D/C weigth 344 pounds. Discharged 06/26/21 .   Seen in [facility] 3/23, volume up, likely in setting of high salt intake. Instructed to take extra torsemide 50 mg daily.   Admitted 07/17/21 with a/c CHF, diuresed with IV lasix. Discharged home, weight 338 lbs.  Post hospital follow up 3/23, NYHA III, volume OK on exam.   Admitted 4/23 with acute on chronic respiratory failure 2/2 CPAP noncompliance and unintentional pain medication overdose. Failed NIV and required intubation. CHF compensated.  Admitted 5/23 with SOB and AKI. GDMT held. Weight down to 325 lbs at discharge, new SCr baseline 1.5. Amlodipine, torsemide and spiro stopped at discharge. Saw general cardiology at follow up and torsemide restarted at 60 mg daily and spiro at 25 mg daily.  Today he returns for HF follow up. Weight is down about 13 lbs.  He is using CPAP.  Has periodic "anxiety attacks."  He generally does ok walking around his house but gets short of breath walking farther distances.  He uses home oxygen, 2L [place].  No chest pain.  No lightheadedness.     ECG (personally reviewed): NSR, normal  Labs (6/23): K 4.7, creatinine 1.35 Labs (7/23): BNP 19.5, K 4.6, creatinine 1.58  PMH: 1. CKD stage 3 2. OSA: Uses CPAP.  3. HTN 4. Atrial fibrillation: Paroxysmal.  5. COPD: Prior smoker, quit in early 2023.  - Uses 2 L home oxygen.  6. Erectile dysfunction 7. Chronic diastolic CHF: Echo (69/62) with EF 60-65%, normal RV, normal IVC.  8. Hyperlipidemia  ROS: All systems reviewed and negative except as per HPI.    Current Outpatient Medications  Medication Sig Dispense Refill   ADVAIR HFA 115-21 MCG/ACT inhaler INHALE 2 PUFFS INTO THE LUNGS 2 (TWO) TIMES DAILY. 36 g 12   albuterol (VENTOLIN HFA) 108 (90 Base) MCG/ACT inhaler Inhale 2 puffs into the lungs every 4 (four) hours as needed for wheezing or shortness of breath. 18 g 0   budesonide (PULMICORT) 0.25 MG/2ML nebulizer solution Use 1 vial (2 mLs) by nebulization 2 times daily. 60 mL 12   EPINEPHrine 0.3 mg/0.3 mL IJ SOAJ injection Inject 0.3 mg into the muscle as needed for anaphylaxis. 1 each 1   fluticasone (FLONASE) 50 MCG/ACT nasal spray PLACE 1 SPRAY INTO BOTH NOSTRILS DAILY. 16 g 11   mupirocin ointment (BACTROBAN) 2 % Place 1 application. into the nose 2 (two) times daily. (Patient taking differently: Place 1 application  into the nose daily.) 22 g 0   polyethylene glycol (MIRALAX / GLYCOLAX) 17 g packet Take 17 g by mouth 2 (two) times daily. (Patient taking differently: Take 17 g by mouth 2 (  two) times daily. As needed) 14 each 0   saline (AYR) GEL Place 1 application into both nostrils at bedtime. (Patient taking differently: Place 1 application  into both nostrils at bedtime as needed (for congestion).) 14.1 g 11   tamsulosin (FLOMAX) 0.4 MG CAPS capsule Take 1 capsule (0.4 mg total) by mouth daily after supper. 30 capsule 2   apixaban (ELIQUIS) 5 MG TABS tablet Take 1 tablet (5 mg total) by mouth 2 (two) times daily. 60 tablet 11   atorvastatin (LIPITOR) 10 MG tablet Take 1  tablet (10 mg total) by mouth daily. 90 tablet 3   bisoprolol (ZEBETA) 5 MG tablet Take 0.5 tablets (2.5 mg total) by mouth daily. 45 tablet 3   dapagliflozin propanediol (FARXIGA) 10 MG TABS tablet Take 1 tablet (10 mg total) by mouth daily. 30 tablet 11   doxazosin (CARDURA) 2 MG tablet Take 1 tablet by mouth daily. 90 tablet 3   famotidine (PEPCID) 20 MG tablet Take 1 tablet (20 mg total) by mouth 2 (two) times daily. 28 tablet 0   pantoprazole (PROTONIX) 40 MG tablet Take 1 tablet (40 mg total) by mouth daily. 30 tablet 0   potassium chloride SA (KLOR-CON M) 20 MEQ tablet Take 2 tablets (40 mEq total) by mouth daily. 180 tablet 3   spironolactone (ALDACTONE) 25 MG tablet Take 1 tablet (25 mg total) by mouth daily. 90 tablet 3   torsemide (DEMADEX) 20 MG tablet Take 3 tablets (60 mg total) by mouth daily. 180 tablet 3   No current facility-administered medications for this encounter.   Allergies  Allergen Reactions   Ace Inhibitors Anaphylaxis   Black Cherry Fruit Extract Marcelline Mates Extract] Anaphylaxis    Tongue   Grenadine Flavor [Flavoring Agent] Anaphylaxis   Social History   Socioeconomic History   Marital status: Single    Spouse name: Not on file   Number of children: Not on file   Years of education: Not on file   Highest education level: Not on file  Occupational History   Not on file  Tobacco Use   Smoking status: Former    Packs/day: 1.00    Years: 36.00    Total pack years: 36.00    Types: Cigarettes    Quit date: 01/08/2021    Years since quitting: 1.0   Smokeless tobacco: Never  Vaping Use   Vaping Use: Never used  Substance and Sexual Activity   Alcohol use: Yes    Alcohol/week: 1.0 standard drink of alcohol    Types: 1 Shots of liquor per week    Comment: socially   Drug use: Yes    Types: Marijuana    Comment: every other week   Sexual activity: Not on file  Other Topics Concern   Not on file  Social History Narrative   Not on file   Social  Determinants of Health   Financial Resource Strain: High Risk (07/15/2021)   Overall Financial Resource Strain (CARDIA)    Difficulty of Paying Living Expenses: Very hard  Food Insecurity: No Food Insecurity (07/15/2021)   Hunger Vital Sign    Worried About Running Out of Food in the Last Year: Never true    Ran Out of Food in the Last Year: Never true  Transportation Needs: No Transportation Needs (07/15/2021)   PRAPARE - Hydrologist (Medical): No    Lack of Transportation (Non-Medical): No  Physical Activity: Not on file  Stress: Not on file  Social Connections: Not on file  Intimate Partner Violence: Not on file   Family History  Problem Relation Age of Onset   Diabetes Mother    Diabetes Father    BP 130/80   Pulse 98   Wt (!) 157.9 kg (348 lb 3.2 oz)   SpO2 92%   BMI 45.94 kg/m   Wt Readings from Last 3 Encounters:  02/02/22 (!) 157.9 kg (348 lb 3.2 oz)  12/29/21 (!) 155.6 kg (343 lb)  11/23/21 (!) 164.1 kg (361 lb 12.8 oz)   PHYSICAL EXAM: General: NAD Neck: Thick, JVP 8-9 cm, no thyromegaly or thyroid nodule.  Lungs: Clear to auscultation bilaterally with normal respiratory effort. CV: Nondisplaced PMI.  Heart regular S1/S2, no S3/S4, no murmur.  Trace ankle edema.  No carotid bruit.  Normal pedal pulses.  Abdomen: Soft, nontender, no hepatosplenomegaly, no distention.  Skin: Intact without lesions or rashes.  Neurologic: Alert and oriented x 3.  Psych: Normal affect. Extremities: No clubbing or cyanosis.  HEENT: Normal.   ASSESSMENT & PLAN: 1. Chronic diastolic heart failure: Echo in 12/22 with EF 60-65%, RV normal. Multiple recent admissions.  On exam, he does look volume overloaded though his weight has trended down.   - Increase torsemide to 60 mg daily (he is only taking 40 mg daily).  BMET/BNP today, BMET in 10 days.  - Continue bisoprolol 2.5 mg daily. - Continue Farxiga 10 mg daily. - Continue spironolactone 25 mg daily. -  Angioedema with ACEI so not a candidate for ARNI.  - Discussed low salt food choices and limiting fluid intake to < 2 liter per day.  - ? Cardiomems, but he has Medicaid => he would be interested, may be able to get this when he goes on Medicare.  2. Atrial fibrillation: Paroxysmal.  NSR today.   - Continue bisoprolol.  - Continue Eliquis 5 mg bid. No bleeding issues. 3. OSA: Reinforced compliance w/ Bipap.  4. COPD: Prior smoker.  Uses home oxygen.  5. Obesity: Body mass index is 45.94 kg/m. - Discussed semaglutide but he would like to try weight loss with diet and exercise. 6. CKD Stage IIIa: Baseline around 1.3-1.6.  - Continue SGLT2i.  - BMET today. 7. Hyperlipidemia: Check lipids today.  8. Erectile dysfunction: Likely related to CHF.  He does not use nitrates.  - Will let him try Viagra 50 mg prn. He was told not to use NTG with this.    He is at high risk for readmits due to poor insight and ongoing dietary compliance issues.   Loralie Champagne 02/03/2022

## 2022-02-12 ENCOUNTER — Ambulatory Visit (HOSPITAL_COMMUNITY)
Admission: RE | Admit: 2022-02-12 | Discharge: 2022-02-12 | Disposition: A | Discharge status: Home / Self Care | Payer: Medicaid Other | Source: Ambulatory Visit | Attending: Cardiology | Admitting: Cardiology

## 2022-02-12 DIAGNOSIS — I5032 Chronic diastolic (congestive) heart failure: Secondary | ICD-10-CM | POA: Diagnosis not present

## 2022-02-12 LAB — BASIC METABOLIC PANEL
Anion gap: 9 (ref 5–15)
BUN: 15 mg/dL (ref 8–23)
CO2: 36 mmol/L — ABNORMAL HIGH (ref 22–32)
Calcium: 9.2 mg/dL (ref 8.9–10.3)
Chloride: 97 mmol/L — ABNORMAL LOW (ref 98–111)
Creatinine, Ser: 1.66 mg/dL — ABNORMAL HIGH (ref 0.61–1.24)
GFR, Estimated: 46 mL/min — ABNORMAL LOW (ref >60)
Glucose, Bld: 99 mg/dL (ref 70–99)
Potassium: 4.5 mmol/L (ref 3.5–5.1)
Sodium: 142 mmol/L (ref 135–145)

## 2022-02-15 ENCOUNTER — Ambulatory Visit: Payer: Medicaid Other | Attending: Physician Assistant | Admitting: Physician Assistant

## 2022-02-17 NOTE — Progress Notes (Signed)
This encounter was created in error - please disregard.

## 2022-03-04 ENCOUNTER — Encounter (HOSPITAL_COMMUNITY): Payer: Medicaid Other

## 2022-04-05 ENCOUNTER — Ambulatory Visit (HOSPITAL_COMMUNITY)
Admission: RE | Admit: 2022-04-05 | Discharge: 2022-04-05 | Disposition: A | Discharge status: Home / Self Care | Payer: Medicaid Other | Source: Ambulatory Visit | Attending: Family Medicine | Admitting: Family Medicine

## 2022-04-05 ENCOUNTER — Encounter (HOSPITAL_COMMUNITY): Payer: Self-pay

## 2022-04-05 VITALS — BP 104/80 | HR 95 | Wt 343.2 lb

## 2022-04-05 DIAGNOSIS — E785 Hyperlipidemia, unspecified: Secondary | ICD-10-CM | POA: Insufficient documentation

## 2022-04-05 DIAGNOSIS — I5032 Chronic diastolic (congestive) heart failure: Secondary | ICD-10-CM | POA: Diagnosis present

## 2022-04-05 DIAGNOSIS — Z87891 Personal history of nicotine dependence: Secondary | ICD-10-CM | POA: Diagnosis not present

## 2022-04-05 DIAGNOSIS — Z6841 Body Mass Index (BMI) 40.0 and over, adult: Secondary | ICD-10-CM | POA: Diagnosis not present

## 2022-04-05 DIAGNOSIS — I480 Paroxysmal atrial fibrillation: Secondary | ICD-10-CM | POA: Diagnosis not present

## 2022-04-05 DIAGNOSIS — G4733 Obstructive sleep apnea (adult) (pediatric): Secondary | ICD-10-CM | POA: Insufficient documentation

## 2022-04-05 DIAGNOSIS — Z7901 Long term (current) use of anticoagulants: Secondary | ICD-10-CM | POA: Insufficient documentation

## 2022-04-05 DIAGNOSIS — I130 Hypertensive heart and chronic kidney disease with heart failure and stage 1 through stage 4 chronic kidney disease, or unspecified chronic kidney disease: Secondary | ICD-10-CM | POA: Insufficient documentation

## 2022-04-05 DIAGNOSIS — E782 Mixed hyperlipidemia: Secondary | ICD-10-CM

## 2022-04-05 DIAGNOSIS — N1831 Chronic kidney disease, stage 3a: Secondary | ICD-10-CM | POA: Diagnosis present

## 2022-04-05 DIAGNOSIS — J9611 Chronic respiratory failure with hypoxia: Secondary | ICD-10-CM | POA: Diagnosis not present

## 2022-04-05 DIAGNOSIS — E669 Obesity, unspecified: Secondary | ICD-10-CM | POA: Diagnosis not present

## 2022-04-05 DIAGNOSIS — J9612 Chronic respiratory failure with hypercapnia: Secondary | ICD-10-CM

## 2022-04-05 DIAGNOSIS — Z79899 Other long term (current) drug therapy: Secondary | ICD-10-CM | POA: Diagnosis not present

## 2022-04-05 DIAGNOSIS — R251 Tremor, unspecified: Secondary | ICD-10-CM

## 2022-04-05 LAB — COMPREHENSIVE METABOLIC PANEL
ALT: 17 U/L (ref 0–44)
AST: 16 U/L (ref 15–41)
Albumin: 3.3 g/dL — ABNORMAL LOW (ref 3.5–5.0)
Alkaline Phosphatase: 68 U/L (ref 38–126)
Anion gap: 9 (ref 5–15)
BUN: 25 mg/dL — ABNORMAL HIGH (ref 8–23)
CO2: 40 mmol/L — ABNORMAL HIGH (ref 22–32)
Calcium: 8.6 mg/dL — ABNORMAL LOW (ref 8.9–10.3)
Chloride: 89 mmol/L — ABNORMAL LOW (ref 98–111)
Creatinine, Ser: 1.78 mg/dL — ABNORMAL HIGH (ref 0.61–1.24)
GFR, Estimated: 42 mL/min — ABNORMAL LOW (ref >60)
Glucose, Bld: 89 mg/dL (ref 70–99)
Potassium: 5.3 mmol/L — ABNORMAL HIGH (ref 3.5–5.1)
Sodium: 138 mmol/L (ref 135–145)
Total Bilirubin: 0.2 mg/dL — ABNORMAL LOW (ref 0.3–1.2)
Total Protein: 6.7 g/dL (ref 6.5–8.1)

## 2022-04-05 LAB — TSH: TSH: 0.858 u[IU]/mL (ref 0.350–4.500)

## 2022-04-05 LAB — BRAIN NATRIURETIC PEPTIDE: B Natriuretic Peptide: 10.9 pg/mL (ref 0.0–100.0)

## 2022-04-05 NOTE — Patient Instructions (Addendum)
Thank you for coming in today  Labs were done today, if any labs are abnormal the clinic will call you No news is good news  Your physician recommends that you schedule a follow-up appointment in:  3 months with Dr. Mclean You will receive a reminder letter in the mail a few months in advance. If you don't receive a letter, please call our office to schedule the follow-up appointment.    Do the following things EVERYDAY: Weigh yourself in the morning before breakfast. Write it down and keep it in a log. Take your medicines as prescribed Eat low salt foods--Limit salt (sodium) to 2000 mg per day.  Stay as active as you can everyday Limit all fluids for the day to less than 2 liters  At the [facility], you and your health needs are our priority. As part of our continuing mission to provide you with exceptional heart care, we have created designated Provider Care Teams. These Care Teams include your primary Cardiologist (physician) and Advanced Practice Providers (APPs- Physician Assistants and Nurse Practitioners) who all work together to provide you with the care you need, when you need it.   You may see any of the following providers on your designated Care Team at your next follow up: Dr Daniel Bensimhon Dr Dalton McLean Dr. Aditya Sabharwal Amy Clegg, NP Brittainy Simmons, PA Jessica Milford,NP Lindsay Finch, PA Alma Diaz, NP Lauren Kemp, PharmD   Please be sure to bring in all your medications bottles to every appointment.   If you have any questions or concerns before your next appointment please send us a message through mychart or call our office at [phone].    TO LEAVE A MESSAGE FOR THE NURSE SELECT OPTION 2, PLEASE LEAVE A MESSAGE INCLUDING: YOUR NAME DATE OF BIRTH CALL BACK NUMBER REASON FOR CALL**this is important as we prioritize the call backs  YOU WILL RECEIVE A CALL BACK THE SAME DAY AS LONG AS YOU CALL BEFORE 4:00 PM  

## 2022-04-05 NOTE — Progress Notes (Signed)
ADVANCED HF CLINIC NOTE  Primary Care: McVey, Gelene Mink, PA-C Primary Cardiologist: Dr. Sallyanne Kuster HF Cardiologist: Dr. Aundra Dubin  HPI: [name] is a 66 y.o. with a h/o HFpEF, OSA, CPAP, smoker, CKD Stage III, HTN, obesity,and PAF. Has had multiple ED/hospital visits over the last year.  Ongoing compliance issues.    Admitted 03/2022 with A/C hypoxic/hypercarbic respiratory failure and volume overload. Treated with antibiotics steroids, nebs and diuretics.    Admitted 04/29/22 with increased shortness of breath in the setting of HF exacerbation. Diuresed with IV lasix and transitioned to lasix 80 mg twice a day   Evaluated in the ED 05/2021 with COPD exacerbation. Treated with nebulizers and steroids.    Admitted 06/23/21 with A/C respiratory failure and heart exacerbation. Placed on Bipap with improvement. Diuresed with IV lasix and transitioned to torsemide 100 mg daily. D/C weigth 344 pounds. Discharged 06/26/21 .   Seen in [facility] 3/23, volume up, likely in setting of high salt intake. Instructed to take extra torsemide 50 mg daily.   Admitted 07/17/21 with a/c CHF, diuresed with IV lasix. Discharged home, weight 338 lbs.  Post hospital follow up 3/23, NYHA III, volume OK on exam.   Admitted 4/23 with acute on chronic respiratory failure 2/2 CPAP noncompliance and unintentional pain medication overdose. Failed NIV and required intubation. CHF compensated.  Admitted 5/23 with SOB and AKI. GDMT held. Weight down to 325 lbs at discharge, new SCr baseline 1.5. Amlodipine, torsemide and spiro stopped at discharge. Saw general cardiology at follow up and torsemide restarted at 60 mg daily and spiro at 25 mg daily.  Today he returns for HF follow up. Overall feeling fine. Had a virtual visit with Urgent Care last week for SOB, and was instructed to increase torsemide 80 mg daily. He is having hand tremors now and attributes this to increased diuretic dose. He is generally SOB with minimal  activity but says breathing is better.  Can walk around his house without much dyspnea, but gets fatigued. Denies, CP, dizziness, edema, or PND. Chronically sleeps reclined.  Appetite ok. No fever or chills. Weight at home 338-340 pounds. Taking all medications. Now on Ozempic Wears CPAP occasionally. Wears 2L oxygen. Drinks a lot of juice during the day.   ECG (personally reviewed): none ordered today.  Labs (6/23): K 4.7, creatinine 1.35 Labs (7/23): BNP 19.5, K 4.6, creatinine 1.58 Labs (9/23): LDL 52, HDL 47, TGs 213 Labs (10/23): K 4.5, creatinine 1.66  PMH: 1. CKD stage 3 2. OSA: Uses CPAP.  3. HTN 4. Atrial fibrillation: Paroxysmal.  5. COPD: Prior smoker, quit in early 2023.  - Uses 2 L home oxygen.  6. Erectile dysfunction 7. Chronic diastolic CHF: Echo (66/44) with EF 60-65%, normal RV, normal IVC.  8. Hyperlipidemia  ROS: All systems reviewed and negative except as per HPI.   Current Outpatient Medications  Medication Sig Dispense Refill   ADVAIR HFA 115-21 MCG/ACT inhaler INHALE 2 PUFFS INTO THE LUNGS 2 (TWO) TIMES DAILY. 36 g 12   albuterol (VENTOLIN HFA) 108 (90 Base) MCG/ACT inhaler Inhale 2 puffs into the lungs every 4 (four) hours as needed for wheezing or shortness of breath. 18 g 0   apixaban (ELIQUIS) 5 MG TABS tablet Take 1 tablet (5 mg total) by mouth 2 (two) times daily. 60 tablet 11   atorvastatin (LIPITOR) 10 MG tablet Take 1 tablet (10 mg total) by mouth daily. 90 tablet 3   bisoprolol (ZEBETA) 5 MG tablet Take 0.5 tablets (2.5  mg total) by mouth daily. 45 tablet 3   budesonide (PULMICORT) 0.25 MG/2ML nebulizer solution Use 1 vial (2 mLs) by nebulization 2 times daily. 60 mL 12   dapagliflozin propanediol (FARXIGA) 10 MG TABS tablet Take 1 tablet (10 mg total) by mouth daily. 30 tablet 11   doxazosin (CARDURA) 2 MG tablet Take 1 tablet by mouth daily. 90 tablet 3   EPINEPHrine 0.3 mg/0.3 mL IJ SOAJ injection Inject 0.3 mg into the muscle as needed for  anaphylaxis. 1 each 1   famotidine (PEPCID) 20 MG tablet Take 1 tablet (20 mg total) by mouth 2 (two) times daily. 28 tablet 0   fluticasone (FLONASE) 50 MCG/ACT nasal spray PLACE 1 SPRAY INTO BOTH NOSTRILS DAILY. 16 g 11   mupirocin ointment (BACTROBAN) 2 % Place 1 application. into the nose 2 (two) times daily. (Patient taking differently: Place 1 application  into the nose daily as needed.) 22 g 0   pantoprazole (PROTONIX) 40 MG tablet Take 1 tablet (40 mg total) by mouth daily. 30 tablet 0   polyethylene glycol (MIRALAX / GLYCOLAX) 17 g packet Take 17 g by mouth 2 (two) times daily. (Patient taking differently: Take 17 g by mouth 2 (two) times daily. As needed) 14 each 0   potassium chloride SA (KLOR-CON M) 20 MEQ tablet Take 2 tablets (40 mEq total) by mouth daily. 180 tablet 3   saline (AYR) GEL Place 1 application into both nostrils at bedtime. (Patient taking differently: Place 1 application  into both nostrils at bedtime as needed (for congestion).) 14.1 g 11   spironolactone (ALDACTONE) 25 MG tablet Take 1 tablet (25 mg total) by mouth daily. 90 tablet 3   tamsulosin (FLOMAX) 0.4 MG CAPS capsule Take 1 capsule (0.4 mg total) by mouth daily after supper. 30 capsule 2   torsemide (DEMADEX) 20 MG tablet Patient takes 4 tablets by mouth daily.     No current facility-administered medications for this encounter.   Allergies  Allergen Reactions   Ace Inhibitors Anaphylaxis   Black Cherry Fruit Extract [name]] Anaphylaxis    Tongue   Grenadine Flavor [Flavoring Agent] Anaphylaxis   Social History   Socioeconomic History   Marital status: Single    Spouse name: Not on file   Number of children: Not on file   Years of education: Not on file   Highest education level: Not on file  Occupational History   Not on file  Tobacco Use   Smoking status: Former    Packs/day: 1.00    Years: 36.00    Total pack years: 36.00    Types: Cigarettes    Quit date: 01/08/2021    Years  since quitting: 1.2   Smokeless tobacco: Never  Vaping Use   Vaping Use: Never used  Substance and Sexual Activity   Alcohol use: Yes    Alcohol/week: 1.0 standard drink of alcohol    Types: 1 Shots of liquor per week    Comment: socially   Drug use: Yes    Types: Marijuana    Comment: every other week   Sexual activity: Not on file  Other Topics Concern   Not on file  Social History Narrative   Not on file   Social Determinants of Health   Financial Resource Strain: High Risk (07/15/2021)   Overall Financial Resource Strain (CARDIA)    Difficulty of Paying Living Expenses: Very hard  Food Insecurity: No Food Insecurity (07/15/2021)   Hunger Vital Sign  Worried About Charity fundraiser in the Last Year: Never true    [place] in the Last Year: Never true  Transportation Needs: No Transportation Needs (07/15/2021)   PRAPARE - Hydrologist (Medical): No    Lack of Transportation (Non-Medical): No  Physical Activity: Not on file  Stress: Not on file  Social Connections: Not on file  Intimate Partner Violence: Not on file   Family History  Problem Relation Age of Onset   Diabetes Mother    Diabetes Father    BP 104/80   Pulse 95   Wt (!) 155.7 kg (343 lb 3.2 oz)   SpO2 92%   BMI 45.28 kg/m   Wt Readings from Last 3 Encounters:  04/05/22 (!) 155.7 kg (343 lb 3.2 oz)  02/02/22 (!) 157.9 kg (348 lb 3.2 oz)  12/29/21 (!) 155.6 kg (343 lb)   PHYSICAL EXAM: General:  NAD. No resp difficulty, arrived in [facility] HEENT: Normal Neck: Supple. Thick neck, No JVD. Carotids 2+ bilat; no bruits. No lymphadenopathy or thryomegaly appreciated. Cor: PMI nondisplaced. Regular rate & rhythm. No rubs, gallops or murmurs. Lungs: Clear Abdomen: Soft, nontender, nondistended. No hepatosplenomegaly. No bruits or masses. Good bowel sounds. Extremities: No cyanosis, clubbing, rash, edema Neuro: Alert & oriented x 3, cranial nerves grossly intact. Moves all 4  extremities w/o difficulty. Affect pleasant. Slight bilateral hand tremor at rest.  ASSESSMENT & PLAN: 1. Chronic diastolic heart failure: Echo in 12/22 with EF 60-65%, RV normal. Multiple recent admissions.  On exam, he does not look volume overloaded, his weight has trended down.  NYHA II-early III, functional class difficult due to body habitus and COPD. - Continue torsemide 80 mg daily + 40 KCL daily.  BMET/BNP today. - Continue bisoprolol 2.5 mg daily. - Continue Farxiga 10 mg daily. - Continue spironolactone 25 mg daily. - Angioedema with ACEI so not a candidate for ARNI.  - Discussed low salt food choices and limiting fluid intake to < 2 liter per day.  - ? Cardiomems, but he has Medicaid => he would be interested, may be able to get this when he goes on Medicare.  2. Atrial fibrillation: Paroxysmal.  Regular on exam today. - Continue bisoprolol.  - Continue Eliquis 5 mg bid. No bleeding issues. 3. OSA: Reinforced compliance w/ Bipap.  4. COPD: Prior smoker.  Uses home oxygen.  5. Obesity: Body mass index is 45.28 kg/m. - He is now on Ozempic 0.25 mg daily, just started. 6. CKD Stage IIIa: Baseline around 1.3-1.6.  - Continue SGLT2i. No GU symptoms - BMET today. 7. Hyperlipidemia: Good lipids 9/23. 8. Hand tremors: ? essential vs physiologic tremor. Consider referral to Neurology. - Check CMET and TSH.   He is at high risk for readmits due to poor insight and ongoing dietary compliance issues.   Follow up in 3 months with Dr. Aundra Dubin.  Maricela Bo [facility] FNP-BC 04/05/2022

## 2022-04-06 ENCOUNTER — Telehealth (HOSPITAL_COMMUNITY): Payer: Self-pay | Admitting: Cardiology

## 2022-04-06 MED ORDER — POTASSIUM CHLORIDE CRYS ER 20 MEQ PO TBCR: 20.0000 meq | EXTENDED_RELEASE_TABLET | Freq: Every day | ORAL | 3 refills | Status: DC

## 2022-04-06 NOTE — Telephone Encounter (Signed)
  Patient called.  Patient aware. PT WILL HAVE LABS REPEATED WITH HHRN PER SHARONDA, RN

## 2022-04-06 NOTE — Telephone Encounter (Signed)
-----   Message from Jacklynn Ganong, [place] sent at 04/06/2022  7:53 AM EST ----- K is too high. Decrease KCL to 20 daily.  Repeat BMET in 1 week

## 2022-04-26 ENCOUNTER — Ambulatory Visit: Payer: Medicaid Other | Admitting: Podiatry

## 2022-04-26 DIAGNOSIS — B351 Tinea unguium: Secondary | ICD-10-CM

## 2022-04-26 DIAGNOSIS — Z7901 Long term (current) use of anticoagulants: Secondary | ICD-10-CM

## 2022-04-26 DIAGNOSIS — M79675 Pain in left toe(s): Secondary | ICD-10-CM

## 2022-04-26 DIAGNOSIS — M79674 Pain in right toe(s): Secondary | ICD-10-CM

## 2022-04-26 NOTE — Progress Notes (Signed)
Subjective:   Patient ID: Zachary Hawkins, male   DOB: 66 y.o.   MRN: [id number]   HPI Chief Complaint  Patient presents with  . Nail Problem    Routide foot care     On Eliquis   ROS      Objective:  Physical Exam  ***     Assessment:  ***     Plan:  ***

## 2022-05-04 ENCOUNTER — Inpatient Hospital Stay (HOSPITAL_COMMUNITY): Payer: Medicaid Other

## 2022-05-04 ENCOUNTER — Other Ambulatory Visit: Payer: Self-pay

## 2022-05-04 ENCOUNTER — Emergency Department (HOSPITAL_COMMUNITY): Payer: Medicaid Other

## 2022-05-04 ENCOUNTER — Encounter (HOSPITAL_COMMUNITY): Payer: Self-pay | Admitting: Internal Medicine

## 2022-05-04 ENCOUNTER — Inpatient Hospital Stay (HOSPITAL_COMMUNITY)
Admission: EM | Admit: 2022-05-04 | Discharge: 2022-05-08 | DRG: 208 | Disposition: A | Discharge status: Home / Self Care | Payer: Medicaid Other | Attending: Family Medicine | Admitting: Family Medicine

## 2022-05-04 DIAGNOSIS — G9341 Metabolic encephalopathy: Secondary | ICD-10-CM | POA: Diagnosis present

## 2022-05-04 DIAGNOSIS — R7989 Other specified abnormal findings of blood chemistry: Secondary | ICD-10-CM | POA: Diagnosis present

## 2022-05-04 DIAGNOSIS — J441 Chronic obstructive pulmonary disease with (acute) exacerbation: Secondary | ICD-10-CM | POA: Diagnosis present

## 2022-05-04 DIAGNOSIS — Z87891 Personal history of nicotine dependence: Secondary | ICD-10-CM | POA: Diagnosis not present

## 2022-05-04 DIAGNOSIS — E875 Hyperkalemia: Secondary | ICD-10-CM | POA: Diagnosis present

## 2022-05-04 DIAGNOSIS — N1831 Chronic kidney disease, stage 3a: Secondary | ICD-10-CM | POA: Diagnosis present

## 2022-05-04 DIAGNOSIS — I10 Essential (primary) hypertension: Secondary | ICD-10-CM | POA: Diagnosis not present

## 2022-05-04 DIAGNOSIS — I480 Paroxysmal atrial fibrillation: Secondary | ICD-10-CM | POA: Diagnosis present

## 2022-05-04 DIAGNOSIS — N400 Enlarged prostate without lower urinary tract symptoms: Secondary | ICD-10-CM | POA: Diagnosis present

## 2022-05-04 DIAGNOSIS — J384 Edema of larynx: Secondary | ICD-10-CM | POA: Diagnosis present

## 2022-05-04 DIAGNOSIS — I429 Cardiomyopathy, unspecified: Secondary | ICD-10-CM | POA: Diagnosis present

## 2022-05-04 DIAGNOSIS — I5033 Acute on chronic diastolic (congestive) heart failure: Secondary | ICD-10-CM | POA: Diagnosis present

## 2022-05-04 DIAGNOSIS — R531 Weakness: Secondary | ICD-10-CM

## 2022-05-04 DIAGNOSIS — I130 Hypertensive heart and chronic kidney disease with heart failure and stage 1 through stage 4 chronic kidney disease, or unspecified chronic kidney disease: Secondary | ICD-10-CM | POA: Diagnosis present

## 2022-05-04 DIAGNOSIS — D6869 Other thrombophilia: Secondary | ICD-10-CM | POA: Diagnosis present

## 2022-05-04 DIAGNOSIS — Z7951 Long term (current) use of inhaled steroids: Secondary | ICD-10-CM

## 2022-05-04 DIAGNOSIS — Z91199 Patient's noncompliance with other medical treatment and regimen due to unspecified reason: Secondary | ICD-10-CM

## 2022-05-04 DIAGNOSIS — E785 Hyperlipidemia, unspecified: Secondary | ICD-10-CM | POA: Diagnosis present

## 2022-05-04 DIAGNOSIS — J9690 Respiratory failure, unspecified, unspecified whether with hypoxia or hypercapnia: Secondary | ICD-10-CM | POA: Diagnosis present

## 2022-05-04 DIAGNOSIS — Z9981 Dependence on supplemental oxygen: Secondary | ICD-10-CM | POA: Diagnosis not present

## 2022-05-04 DIAGNOSIS — Z1152 Encounter for screening for COVID-19: Secondary | ICD-10-CM

## 2022-05-04 DIAGNOSIS — J9621 Acute and chronic respiratory failure with hypoxia: Principal | ICD-10-CM | POA: Diagnosis present

## 2022-05-04 DIAGNOSIS — J9622 Acute and chronic respiratory failure with hypercapnia: Secondary | ICD-10-CM | POA: Diagnosis present

## 2022-05-04 DIAGNOSIS — Z6841 Body Mass Index (BMI) 40.0 and over, adult: Secondary | ICD-10-CM

## 2022-05-04 DIAGNOSIS — Z833 Family history of diabetes mellitus: Secondary | ICD-10-CM

## 2022-05-04 DIAGNOSIS — Z7901 Long term (current) use of anticoagulants: Secondary | ICD-10-CM | POA: Diagnosis not present

## 2022-05-04 DIAGNOSIS — Z79899 Other long term (current) drug therapy: Secondary | ICD-10-CM

## 2022-05-04 DIAGNOSIS — Z91148 Patient's other noncompliance with medication regimen for other reason: Secondary | ICD-10-CM | POA: Diagnosis not present

## 2022-05-04 DIAGNOSIS — E8881 Metabolic syndrome: Secondary | ICD-10-CM | POA: Diagnosis present

## 2022-05-04 DIAGNOSIS — G4733 Obstructive sleep apnea (adult) (pediatric): Secondary | ICD-10-CM | POA: Diagnosis present

## 2022-05-04 DIAGNOSIS — E662 Morbid (severe) obesity with alveolar hypoventilation: Secondary | ICD-10-CM | POA: Diagnosis present

## 2022-05-04 DIAGNOSIS — Z888 Allergy status to other drugs, medicaments and biological substances status: Secondary | ICD-10-CM

## 2022-05-04 DIAGNOSIS — Z781 Physical restraint status: Secondary | ICD-10-CM

## 2022-05-04 DIAGNOSIS — R0689 Other abnormalities of breathing: Secondary | ICD-10-CM

## 2022-05-04 DIAGNOSIS — R4182 Altered mental status, unspecified: Secondary | ICD-10-CM

## 2022-05-04 LAB — MRSA NEXT GEN BY PCR, NASAL: MRSA by PCR Next Gen: DETECTED — AB

## 2022-05-04 LAB — CBC WITH DIFFERENTIAL/PLATELET
Abs Immature Granulocytes: 0.2 10*3/uL — ABNORMAL HIGH (ref 0.00–0.07)
Basophils Absolute: 0 10*3/uL (ref 0.0–0.1)
Basophils Relative: 0 %
Eosinophils Absolute: 0.3 10*3/uL (ref 0.0–0.5)
Eosinophils Relative: 3 %
HCT: 49.2 % (ref 39.0–52.0)
Hemoglobin: 13.2 g/dL (ref 13.0–17.0)
Lymphocytes Relative: 6 %
Lymphs Abs: 0.6 10*3/uL — ABNORMAL LOW (ref 0.7–4.0)
MCH: 26 pg (ref 26.0–34.0)
MCHC: 26.8 g/dL — ABNORMAL LOW (ref 30.0–36.0)
MCV: 96.9 fL (ref 80.0–100.0)
Monocytes Absolute: 0.9 10*3/uL (ref 0.1–1.0)
Monocytes Relative: 9 %
Myelocytes: 2 %
Neutro Abs: 8 10*3/uL — ABNORMAL HIGH (ref 1.7–7.7)
Neutrophils Relative %: 80 %
Platelets: 252 10*3/uL (ref 150–400)
RBC: 5.08 MIL/uL (ref 4.22–5.81)
RDW: 17.6 % — ABNORMAL HIGH (ref 11.5–15.5)
WBC: 10 10*3/uL (ref 4.0–10.5)
nRBC: 4.5 % — ABNORMAL HIGH (ref 0.0–0.2)
nRBC: 6 /100 WBC — ABNORMAL HIGH

## 2022-05-04 LAB — RESPIRATORY PANEL BY PCR

## 2022-05-04 LAB — STREP PNEUMONIAE URINARY ANTIGEN: Strep Pneumo Urinary Antigen: NEGATIVE

## 2022-05-04 LAB — COMPREHENSIVE METABOLIC PANEL
ALT: 15 U/L (ref 0–44)
AST: 17 U/L (ref 15–41)
Albumin: 3.5 g/dL (ref 3.5–5.0)
Alkaline Phosphatase: 63 U/L (ref 38–126)
Anion gap: 7 (ref 5–15)
BUN: 17 mg/dL (ref 8–23)
CO2: 36 mmol/L — ABNORMAL HIGH (ref 22–32)
Calcium: 8.6 mg/dL — ABNORMAL LOW (ref 8.9–10.3)
Chloride: 95 mmol/L — ABNORMAL LOW (ref 98–111)
Creatinine, Ser: 1.25 mg/dL — ABNORMAL HIGH (ref 0.61–1.24)
GFR, Estimated: >60 mL/min (ref >60)
Glucose, Bld: 125 mg/dL — ABNORMAL HIGH (ref 70–99)
Potassium: 5.8 mmol/L — ABNORMAL HIGH (ref 3.5–5.1)
Sodium: 138 mmol/L (ref 135–145)
Total Bilirubin: 0.4 mg/dL (ref 0.3–1.2)
Total Protein: 7.3 g/dL (ref 6.5–8.1)

## 2022-05-04 LAB — RAPID URINE DRUG SCREEN, HOSP PERFORMED
Amphetamines: NOT DETECTED
Barbiturates: NOT DETECTED
Benzodiazepines: NOT DETECTED
Cocaine: NOT DETECTED
Opiates: NOT DETECTED
Tetrahydrocannabinol: NOT DETECTED

## 2022-05-04 LAB — BASIC METABOLIC PANEL
Anion gap: 9 (ref 5–15)
BUN: 20 mg/dL (ref 8–23)
CO2: 33 mmol/L — ABNORMAL HIGH (ref 22–32)
Calcium: 9.3 mg/dL (ref 8.9–10.3)
Chloride: 96 mmol/L — ABNORMAL LOW (ref 98–111)
Creatinine, Ser: 1.41 mg/dL — ABNORMAL HIGH (ref 0.61–1.24)
GFR, Estimated: 56 mL/min — ABNORMAL LOW (ref >60)
Glucose, Bld: 133 mg/dL — ABNORMAL HIGH (ref 70–99)
Potassium: 5.4 mmol/L — ABNORMAL HIGH (ref 3.5–5.1)
Sodium: 138 mmol/L (ref 135–145)

## 2022-05-04 LAB — MAGNESIUM: Magnesium: 2.3 mg/dL (ref 1.7–2.4)

## 2022-05-04 LAB — URINALYSIS, ROUTINE W REFLEX MICROSCOPIC
Bilirubin Urine: NEGATIVE
Glucose, UA: >=500 mg/dL — AB
Hgb urine dipstick: NEGATIVE
Ketones, ur: NEGATIVE mg/dL
Leukocytes,Ua: NEGATIVE
Nitrite: NEGATIVE
Protein, ur: 100 mg/dL — AB
Specific Gravity, Urine: 1.029 (ref 1.005–1.030)
pH: 5 (ref 5.0–8.0)

## 2022-05-04 LAB — BRAIN NATRIURETIC PEPTIDE: B Natriuretic Peptide: 120.1 pg/mL — ABNORMAL HIGH (ref 0.0–100.0)

## 2022-05-04 LAB — TROPONIN I (HIGH SENSITIVITY)
Troponin I (High Sensitivity): 25 ng/L — ABNORMAL HIGH (ref <18)
Troponin I (High Sensitivity): 40 ng/L — ABNORMAL HIGH (ref <18)

## 2022-05-04 LAB — GLUCOSE, CAPILLARY
Glucose-Capillary: 121 mg/dL — ABNORMAL HIGH (ref 70–99)
Glucose-Capillary: 144 mg/dL — ABNORMAL HIGH (ref 70–99)
Glucose-Capillary: 168 mg/dL — ABNORMAL HIGH (ref 70–99)

## 2022-05-04 LAB — I-STAT ARTERIAL BLOOD GAS, ED
Acid-Base Excess: 11 mmol/L — ABNORMAL HIGH (ref 0.0–2.0)
Bicarbonate: 36.7 mmol/L — ABNORMAL HIGH (ref 20.0–28.0)
Calcium, Ion: 1.17 mmol/L (ref 1.15–1.40)
HCT: 46 % (ref 39.0–52.0)
Hemoglobin: 15.6 g/dL (ref 13.0–17.0)
O2 Saturation: 99 %
Potassium: 5.3 mmol/L — ABNORMAL HIGH (ref 3.5–5.1)
Sodium: 136 mmol/L (ref 135–145)
TCO2: 38 mmol/L — ABNORMAL HIGH (ref 22–32)
pCO2 arterial: 50 mmHg — ABNORMAL HIGH (ref 32–48)
pH, Arterial: 7.474 — ABNORMAL HIGH (ref 7.35–7.45)
pO2, Arterial: 122 mmHg — ABNORMAL HIGH (ref 83–108)

## 2022-05-04 LAB — RESP PANEL BY RT-PCR (RSV, FLU A&B, COVID)  RVPGX2
Influenza A by PCR: NEGATIVE
Influenza B by PCR: NEGATIVE
Resp Syncytial Virus by PCR: NEGATIVE
SARS Coronavirus 2 by RT PCR: NEGATIVE

## 2022-05-04 LAB — AMMONIA: Ammonia: 67 umol/L — ABNORMAL HIGH (ref 9–35)

## 2022-05-04 LAB — ETHANOL: Alcohol, Ethyl (B): <10 mg/dL (ref <10)

## 2022-05-04 LAB — PROCALCITONIN: Procalcitonin: <0.1 ng/mL

## 2022-05-04 MED ORDER — HYDRALAZINE HCL 20 MG/ML IJ SOLN: 10.0000 mg | INTRAMUSCULAR | Status: DC | PRN

## 2022-05-04 MED ORDER — REVEFENACIN 175 MCG/3ML IN SOLN
175.0000 ug | Freq: Every day | RESPIRATORY_TRACT | Status: DC
  Administered 2022-05-05 – 2022-05-08 (×4): 175 ug via RESPIRATORY_TRACT
  Filled 2022-05-04 (×6): qty 3

## 2022-05-04 MED ORDER — FAMOTIDINE 20 MG PO TABS
20.0000 mg | ORAL_TABLET | Freq: Two times a day (BID) | ORAL | Status: DC
  Administered 2022-05-04 – 2022-05-08 (×9): 20 mg
  Filled 2022-05-04 (×10): qty 1

## 2022-05-04 MED ORDER — PROPOFOL 1000 MG/100ML IV EMUL
INTRAVENOUS | Status: AC
  Filled 2022-05-04: qty 100

## 2022-05-04 MED ORDER — SODIUM ZIRCONIUM CYCLOSILICATE 10 G PO PACK
10.0000 g | PACK | Freq: Two times a day (BID) | ORAL | Status: AC
  Administered 2022-05-04 (×2): 10 g
  Filled 2022-05-04 (×2): qty 1

## 2022-05-04 MED ORDER — FENTANYL 2500MCG IN NS 250ML (10MCG/ML) PREMIX INFUSION
50.0000 ug/h | INTRAVENOUS | Status: DC
  Administered 2022-05-04: 50 ug/h via INTRAVENOUS
  Filled 2022-05-04: qty 250

## 2022-05-04 MED ORDER — DOCUSATE SODIUM 100 MG PO CAPS: 100.0000 mg | ORAL_CAPSULE | Freq: Two times a day (BID) | ORAL | Status: DC | PRN

## 2022-05-04 MED ORDER — MUPIROCIN 2 % EX OINT
1.0000 | TOPICAL_OINTMENT | Freq: Two times a day (BID) | CUTANEOUS | Status: DC
  Administered 2022-05-04 – 2022-05-08 (×8): 1 via NASAL
  Filled 2022-05-04 (×3): qty 22

## 2022-05-04 MED ORDER — CHLORHEXIDINE GLUCONATE CLOTH 2 % EX PADS
6.0000 | MEDICATED_PAD | Freq: Every day | CUTANEOUS | Status: DC
  Administered 2022-05-04 – 2022-05-07 (×3): 6 via TOPICAL

## 2022-05-04 MED ORDER — APIXABAN 5 MG PO TABS
5.0000 mg | ORAL_TABLET | Freq: Two times a day (BID) | ORAL | Status: DC
  Administered 2022-05-04 – 2022-05-08 (×9): 5 mg
  Filled 2022-05-04 (×9): qty 1

## 2022-05-04 MED ORDER — FENTANYL CITRATE PF 50 MCG/ML IJ SOSY: 50.0000 ug | PREFILLED_SYRINGE | INTRAMUSCULAR | Status: DC | PRN

## 2022-05-04 MED ORDER — NALOXONE HCL 0.4 MG/ML IJ SOLN
0.4000 mg | Freq: Once | INTRAMUSCULAR | Status: AC
  Administered 2022-05-04: 0.4 mg via INTRAVENOUS

## 2022-05-04 MED ORDER — SUCCINYLCHOLINE CHLORIDE 200 MG/10ML IV SOSY
PREFILLED_SYRINGE | INTRAVENOUS | Status: AC
  Filled 2022-05-04: qty 10

## 2022-05-04 MED ORDER — FAMOTIDINE 20 MG PO TABS: 20.0000 mg | ORAL_TABLET | Freq: Two times a day (BID) | ORAL | Status: DC

## 2022-05-04 MED ORDER — SODIUM CHLORIDE 0.9 % IV SOLN
500.0000 mg | INTRAVENOUS | Status: DC
  Administered 2022-05-04: 500 mg via INTRAVENOUS
  Filled 2022-05-04 (×3): qty 5

## 2022-05-04 MED ORDER — POLYETHYLENE GLYCOL 3350 17 G PO PACK
17.0000 g | PACK | Freq: Two times a day (BID) | ORAL | Status: DC
  Administered 2022-05-04 – 2022-05-07 (×5): 17 g
  Filled 2022-05-04 (×8): qty 1

## 2022-05-04 MED ORDER — BUDESONIDE 0.5 MG/2ML IN SUSP
0.5000 mg | Freq: Two times a day (BID) | RESPIRATORY_TRACT | Status: DC
  Administered 2022-05-04 – 2022-05-08 (×8): 0.5 mg via RESPIRATORY_TRACT
  Filled 2022-05-04 (×8): qty 2

## 2022-05-04 MED ORDER — FUROSEMIDE 10 MG/ML IJ SOLN
80.0000 mg | Freq: Two times a day (BID) | INTRAMUSCULAR | Status: DC
  Administered 2022-05-04 – 2022-05-07 (×7): 80 mg via INTRAVENOUS
  Filled 2022-05-04 (×7): qty 8

## 2022-05-04 MED ORDER — DOCUSATE SODIUM 50 MG/5ML PO LIQD: 100.0000 mg | Freq: Two times a day (BID) | ORAL | Status: DC | PRN

## 2022-05-04 MED ORDER — ATORVASTATIN CALCIUM 10 MG PO TABS
10.0000 mg | ORAL_TABLET | Freq: Every day | ORAL | Status: DC
  Administered 2022-05-04 – 2022-05-08 (×5): 10 mg
  Filled 2022-05-04 (×5): qty 1

## 2022-05-04 MED ORDER — SODIUM CHLORIDE 0.9 % IV SOLN
2.0000 g | INTRAVENOUS | Status: DC
  Administered 2022-05-04: 2 g via INTRAVENOUS
  Filled 2022-05-04: qty 20

## 2022-05-04 MED ORDER — PROPOFOL 1000 MG/100ML IV EMUL
INTRAVENOUS | Status: AC
  Administered 2022-05-04: 20 ug/kg/min
  Filled 2022-05-04: qty 100

## 2022-05-04 MED ORDER — ORAL CARE MOUTH RINSE: 15.0000 mL | OROMUCOSAL | Status: DC | PRN

## 2022-05-04 MED ORDER — APIXABAN 5 MG PO TABS: 5.0000 mg | ORAL_TABLET | Freq: Two times a day (BID) | ORAL | Status: DC

## 2022-05-04 MED ORDER — ARFORMOTEROL TARTRATE 15 MCG/2ML IN NEBU
15.0000 ug | INHALATION_SOLUTION | Freq: Two times a day (BID) | RESPIRATORY_TRACT | Status: DC
  Administered 2022-05-04 – 2022-05-08 (×8): 15 ug via RESPIRATORY_TRACT
  Filled 2022-05-04 (×8): qty 2

## 2022-05-04 MED ORDER — CALCIUM GLUCONATE-NACL 1-0.675 GM/50ML-% IV SOLN
1.0000 g | Freq: Once | INTRAVENOUS | Status: AC
  Administered 2022-05-04: 1000 mg via INTRAVENOUS
  Filled 2022-05-04: qty 50

## 2022-05-04 MED ORDER — FUROSEMIDE 10 MG/ML IJ SOLN
80.0000 mg | Freq: Once | INTRAMUSCULAR | Status: AC
  Administered 2022-05-04: 80 mg via INTRAVENOUS
  Filled 2022-05-04: qty 8

## 2022-05-04 MED ORDER — ROCURONIUM BROMIDE 50 MG/5ML IV SOLN
INTRAVENOUS | Status: DC | PRN
  Administered 2022-05-04: 100 mg via INTRAVENOUS

## 2022-05-04 MED ORDER — ORAL CARE MOUTH RINSE
15.0000 mL | OROMUCOSAL | Status: DC
  Administered 2022-05-04 – 2022-05-05 (×7): 15 mL via OROMUCOSAL

## 2022-05-04 MED ORDER — POLYETHYLENE GLYCOL 3350 17 G PO PACK: 17.0000 g | PACK | Freq: Every day | ORAL | Status: DC | PRN

## 2022-05-04 MED ORDER — FENTANYL BOLUS VIA INFUSION
50.0000 ug | INTRAVENOUS | Status: DC | PRN
  Administered 2022-05-04 – 2022-05-05 (×2): 100 ug via INTRAVENOUS

## 2022-05-04 MED ORDER — INSULIN ASPART 100 UNIT/ML IJ SOLN
0.0000 [IU] | INTRAMUSCULAR | Status: DC
  Administered 2022-05-04: 2 [IU] via SUBCUTANEOUS
  Administered 2022-05-04: 3 [IU] via SUBCUTANEOUS
  Administered 2022-05-04 – 2022-05-05 (×2): 2 [IU] via SUBCUTANEOUS

## 2022-05-04 MED ORDER — ETOMIDATE 2 MG/ML IV SOLN
INTRAVENOUS | Status: AC
  Filled 2022-05-04: qty 20

## 2022-05-04 MED ORDER — HEPARIN SODIUM (PORCINE) 5000 UNIT/ML IJ SOLN: 5000.0000 [IU] | Freq: Three times a day (TID) | INTRAMUSCULAR | Status: DC

## 2022-05-04 MED ORDER — PROPOFOL 1000 MG/100ML IV EMUL
0.0000 ug/kg/min | INTRAVENOUS | Status: DC
  Administered 2022-05-04: 30 ug/kg/min via INTRAVENOUS
  Administered 2022-05-04: 40 ug/kg/min via INTRAVENOUS
  Administered 2022-05-05 (×2): 30 ug/kg/min via INTRAVENOUS
  Filled 2022-05-04 (×5): qty 100

## 2022-05-04 MED ORDER — ETOMIDATE 2 MG/ML IV SOLN
INTRAVENOUS | Status: DC | PRN
  Administered 2022-05-04: 20 mg via INTRAVENOUS

## 2022-05-04 MED ORDER — FENTANYL CITRATE PF 50 MCG/ML IJ SOSY
50.0000 ug | PREFILLED_SYRINGE | Freq: Once | INTRAMUSCULAR | Status: AC
  Administered 2022-05-04: 50 ug via INTRAVENOUS
  Filled 2022-05-04: qty 1

## 2022-05-04 MED ORDER — ALBUTEROL SULFATE (2.5 MG/3ML) 0.083% IN NEBU: 2.5000 mg | INHALATION_SOLUTION | RESPIRATORY_TRACT | Status: DC | PRN

## 2022-05-04 MED ORDER — METHYLPREDNISOLONE SODIUM SUCC 125 MG IJ SOLR
125.0000 mg | Freq: Every day | INTRAMUSCULAR | Status: DC
  Administered 2022-05-04 – 2022-05-07 (×4): 125 mg via INTRAVENOUS
  Filled 2022-05-04 (×4): qty 2

## 2022-05-04 MED ORDER — LACTULOSE 10 GM/15ML PO SOLN
30.0000 g | Freq: Once | ORAL | Status: AC
  Administered 2022-05-04: 30 g
  Filled 2022-05-04: qty 45

## 2022-05-04 MED ORDER — DEXMEDETOMIDINE HCL IN NACL 400 MCG/100ML IV SOLN
0.0000 ug/kg/h | INTRAVENOUS | Status: DC
  Administered 2022-05-04: 0.6 ug/kg/h via INTRAVENOUS
  Filled 2022-05-04: qty 100

## 2022-05-04 MED ORDER — SODIUM CHLORIDE 0.9 % IV SOLN: INTRAVENOUS | Status: DC | PRN

## 2022-05-04 MED ORDER — ROCURONIUM BROMIDE 10 MG/ML (PF) SYRINGE
PREFILLED_SYRINGE | INTRAVENOUS | Status: AC
  Filled 2022-05-04: qty 10

## 2022-05-04 MED ORDER — POLYETHYLENE GLYCOL 3350 17 G PO PACK: 17.0000 g | PACK | Freq: Two times a day (BID) | ORAL | Status: DC

## 2022-05-04 MED ORDER — ATORVASTATIN CALCIUM 10 MG PO TABS: 10.0000 mg | ORAL_TABLET | Freq: Every day | ORAL | Status: DC

## 2022-05-04 NOTE — Progress Notes (Signed)
Pt transported from CT scan to 36M 11 on the ventilator without incident.

## 2022-05-04 NOTE — H&P (Signed)
NAME:  Zachary Hawkins, MRN:  [id number], DOB:  21-Jun-1955, LOS: 0 ADMISSION DATE:  05/04/2022, CONSULTATION DATE:  12/26 REFERRING MD:  Dr. Nechama Guard, CHIEF COMPLAINT:  Acute respiratory    History of Present Illness:  Patient is a 66 year old male with pertinent PMH of OSA, H DOS, COPD on 2 LNC, grade 2 diastolic CHF, CKD 3, A-fib on Eliquis presents to [facility] ED on 12/26 with acute respiratory failure with hypoxia/hypercapnia.  Patient lives at home alone.  Patient's daughter spoke to patient on phone on 12/26 at 7:45 AM and was normal.  Later today patient's other daughter went to visit patient and found him naked and confused complaining of SOB.  This daughter states that patient is not compliant with medications.  EMS called and found patient with sats 60% on 2 LNC.  Patient confused.  Placed on nonrebreather and transferred to [facility].  On arrival to [facility] ED on 12/26, patient very altered.  Sats improved with NRB.  Mild tachycardia and BP 152/97.  ABG 7.12, PaCO2 110, PaO2 98.  Patient was intubated.  Frothy secretions appreciated during intubation and trace BLE edema.  Lasix ordered.  CXR showing streaky opacities bilateral bases likely atelectasis versus pneumonia.  PCCM consulted for admission.  Pertinent ED labs: K5.8, creat 1.25 (baseline 1.3), ethanol WNL, BNP pending, troponin 25, UDS pending, UA pending, flu/COVID/RSV pending, RVP pending   Pertinent  Medical History   Past Medical History:  Diagnosis Date   Asthma    BPH (benign prostatic hyperplasia)    Cardiomyopathy ([place]) 06/2017   EF 40-45%   CHF (congestive heart failure) (HCC)    Chronic renal insufficiency, stage 3 (moderate) (HCC)    COPD (chronic obstructive pulmonary disease) (HCC)    Diastolic dysfunction [id number]   grade 2    Hyperlipidemia    Hypertension    Hypertensive urgency    Obesity    Obesity    OSA (obstructive sleep apnea)    Paroxysmal atrial fibrillation ([place]) 2022   on Eliquis     Significant  Hospital Events: Including procedures, antibiotic start and stop dates in addition to other pertinent events   12/26: AMS; found hypoxic and hypercarbic; intubated  Interim History / Subjective:  See above  Objective   Blood pressure (!) 103/59, pulse (!) 127, temperature 99.5 F (37.5 C), temperature source Axillary, resp. rate (!) 32, height 6\' 2"  (1.88 m), SpO2 100 %.    Vent Mode: PRVC FiO2 (%):  [100 %] 100 % Set Rate:  [32 bmp] 32 bmp Vt Set:  [620 mL] 620 mL PEEP:  [10 cmH20] 10 cmH20 Plateau Pressure:  [28 cmH20] 28 cmH20  No intake or output data in the 24 hours ending 05/04/22 1352 There were no vitals filed for this visit.  Examination: General:  obese/critically ill appearing on mech vent HEENT: MM pink/moist; ETT in place Neuro: sedated; perrl CV: s1s2, no m/r/g PULM:  dim clear BS bilaterally; on mech vent PRVC GI: soft, bsx4 active  Extremities: warm/dry, trace ble edema  Skin: no rashes or lesions appreciated   Resolved Hospital Problem list     Assessment & Plan:  Acute respiratory failure with hypoxia and hypercarbia COPD versus CHF exacerbation? OSA: non compliant w/ cpap per family HVOS Plan: -LTVV strategy with tidal volumes of 6-8 cc/kg ideal body weight -abg in few hours post intubation -Goal plateau pressures less than 30 and driving pressures less than 15 -Wean PEEP/FiO2 for SpO2 >92% -VAP bundle in place -Daily  SAT and SBT -PAD protocol in place -wean sedation for RASS goal 0 to -1 -Follow intermittent CXR and ABG PRN -Lasix given -follow-up BNP and trend troponin -Brovana, Pulmicort, Yupelri w/ albuterol prn for wheezing -IV steroids -Rocephin/azithromycin for CAP Ppx -F/U RVP, flu/COVID/RSV, urine Legionella/strep  Acute encephalopathy: Likely hypoxia/hypercarbia related -ethanol wnl Plan: -CT head pending -Adjust vent settings for hypoxia/hypercarbia; repeat ABG in few hours -Check UDS, UA, ammonia, pct  Hyperkalemia CKD 3:  baseline creat 1.3-1.5 P: -calcium and lasix given -repeat bmp later tonight -Trend BMP / urinary output -Replace electrolytes as indicated -Avoid nephrotoxic agents, ensure adequate renal perfusion  Grade 2 diastolic CHF HTN HLD Plan: -Resume home statin -prn hydralazine for HTN -Hold home antihypertensives -Strict I/os; daily weights  A-fib on Eliquis Plan: -Telemetry monitoring -Resume Eliquis  Best Practice (right click and "Reselect all SmartList Selections" daily)   Diet/type: NPO w/ meds via tube DVT prophylaxis: DOAC GI prophylaxis: H2B Lines: N/A Foley:  N/A Code Status:  full code Last date of multidisciplinary goals of care discussion [12/26 spoke with daughter Almira Bar who is a Publishing rights manager in [place], [place] and updated over phone. States patient is non compliant with medications and does not wear cpap like he should. ]  Labs   CBC: Recent Labs  Lab 05/04/22 1300  WBC PENDING  NEUTROABS PENDING  HGB 13.2  HCT 49.2  MCV 96.9  PLT 252    Basic Metabolic Panel: No results for input(s): "NA", "K", "CL", "CO2", "GLUCOSE", "BUN", "CREATININE", "CALCIUM", "MG", "PHOS" in the last 168 hours. GFR: CrCl cannot be calculated (Patient's most recent lab result is older than the maximum 21 days allowed.). Recent Labs  Lab 05/04/22 1300  WBC PENDING    Liver Function Tests: No results for input(s): "AST", "ALT", "ALKPHOS", "BILITOT", "PROT", "ALBUMIN" in the last 168 hours. No results for input(s): "LIPASE", "AMYLASE" in the last 168 hours. No results for input(s): "AMMONIA" in the last 168 hours.  ABG    Component Value Date/Time   PHART 7.434 08/15/2021 1036   PCO2ART 62.8 (H) 08/15/2021 1036   PO2ART 63 (L) 08/15/2021 1036   HCO3 42.4 (H) 08/15/2021 1036   TCO2 44 (H) 08/15/2021 1036   ACIDBASEDEF 2.0 03/12/2021 0557   O2SAT 92 08/15/2021 1036     Coagulation Profile: No results for input(s): "INR", "PROTIME" in the last 168 hours.  Cardiac  Enzymes: No results for input(s): "CKTOTAL", "CKMB", "CKMBINDEX", "TROPONINI" in the last 168 hours.  HbA1C: No results found for: "HGBA1C"  CBG: No results for input(s): "GLUCAP" in the last 168 hours.  Review of Systems:   Patient is encephalopathic and/or intubated. Therefore history has been obtained from chart review.    Past Medical History:  He,  has a past medical history of Asthma, BPH (benign prostatic hyperplasia), Cardiomyopathy (HCC) (06/2017), CHF (congestive heart failure) (HCC), Chronic renal insufficiency, stage 3 (moderate) (HCC), COPD (chronic obstructive pulmonary disease) (HCC), Diastolic dysfunction (06/2017), Hyperlipidemia, Hypertension, Hypertensive urgency, Obesity, Obesity, OSA (obstructive sleep apnea), and Paroxysmal atrial fibrillation (HCC) (2022).   Surgical History:  No past surgical history on file.   Social History:   reports that he quit smoking about 15 months ago. His smoking use included cigarettes. He has a 36.00 pack-year smoking history. He has never used smokeless tobacco. He reports current alcohol use of about 1.0 standard drink of alcohol per week. He reports current drug use. Drug: Marijuana.   Family History:  His family history includes Diabetes in his  father and mother.   Allergies Allergies  Allergen Reactions   Ace Inhibitors Anaphylaxis   Black Cherry Fruit Extract Marcelline Mates Extract] Anaphylaxis    Tongue   Grenadine Flavor [Flavoring Agent] Anaphylaxis     Home Medications  Prior to Admission medications   Medication Sig Start Date End Date Taking? Authorizing Provider  ADVAIR HFA 115-21 MCG/ACT inhaler INHALE 2 PUFFS INTO THE LUNGS 2 (TWO) TIMES DAILY. 07/23/20   Charlott Rakes, MD  albuterol (VENTOLIN HFA) 108 (90 Base) MCG/ACT inhaler Inhale 2 puffs into the lungs every 4 (four) hours as needed for wheezing or shortness of breath. 02/13/21   Croitoru, Mihai, MD  apixaban (ELIQUIS) 5 MG TABS tablet Take 1 tablet (5 mg total)  by mouth 2 (two) times daily. 02/02/22   Larey Dresser, MD  atorvastatin (LIPITOR) 10 MG tablet Take 1 tablet (10 mg total) by mouth daily. 02/02/22   Larey Dresser, MD  bisoprolol (ZEBETA) 5 MG tablet Take 0.5 tablets (2.5 mg total) by mouth daily. 02/02/22   Larey Dresser, MD  budesonide (PULMICORT) 0.25 MG/2ML nebulizer solution Use 1 vial (2 mLs) by nebulization 2 times daily. 08/21/21   Nita Sells, MD  dapagliflozin propanediol (FARXIGA) 10 MG TABS tablet Take 1 tablet (10 mg total) by mouth daily. 02/02/22   Larey Dresser, MD  doxazosin (CARDURA) 2 MG tablet Take 1 tablet by mouth daily. 02/02/22   Larey Dresser, MD  EPINEPHrine 0.3 mg/0.3 mL IJ SOAJ injection Inject 0.3 mg into the muscle as needed for anaphylaxis. 01/09/21   Bonnielee Haff, MD  famotidine (PEPCID) 20 MG tablet Take 1 tablet (20 mg total) by mouth 2 (two) times daily. 01/17/21 04/05/42  Noemi Chapel P, DO  fluticasone (FLONASE) 50 MCG/ACT nasal spray PLACE 1 SPRAY INTO BOTH NOSTRILS DAILY. 01/18/22   Maryjane Hurter, MD  mupirocin ointment (BACTROBAN) 2 % Place 1 application. into the nose 2 (two) times daily. Patient taking differently: Place 1 application  into the nose daily as needed. 10/11/21   Bonnell Public, MD  pantoprazole (PROTONIX) 40 MG tablet Take 1 tablet (40 mg total) by mouth daily. 10/11/21 04/06/23  Dana Allan I, MD  polyethylene glycol (MIRALAX / GLYCOLAX) 17 g packet Take 17 g by mouth 2 (two) times daily. Patient taking differently: Take 17 g by mouth 2 (two) times daily. As needed 10/11/21   Dana Allan I, MD  potassium chloride SA (KLOR-CON M) 20 MEQ tablet Take 1 tablet (20 mEq total) by mouth daily. 04/06/22   Milford, Maricela Bo, FNP  saline (AYR) GEL Place 1 application into both nostrils at bedtime. Patient taking differently: Place 1 application  into both nostrils at bedtime as needed (for congestion). 07/09/21   Maryjane Hurter, MD  spironolactone (ALDACTONE) 25 MG  tablet Take 1 tablet (25 mg total) by mouth daily. 02/02/22   Larey Dresser, MD  tamsulosin (FLOMAX) 0.4 MG CAPS capsule Take 1 capsule (0.4 mg total) by mouth daily after supper. 03/16/21   Pokhrel, Corrie Mckusick, MD  torsemide (DEMADEX) 20 MG tablet Patient takes 4 tablets by mouth daily.    [provider]     Critical care time: 45 minutes    JD Rexene Agent [facility] 05/04/2022, 1:52 PM  Please see [email or web address] for pager details.  From 7A-7P if no response, please call [phone]. After hours, please call ELink [phone].

## 2022-05-04 NOTE — ED Provider Notes (Signed)
[facility] EMERGENCY DEPARTMENT Provider Note   CSN: [id number] Arrival date & time: 05/04/22  1240     History  Chief Complaint  Patient presents with   Altered Mental Status    Zachary Hawkins is a 66 y.o. male.  With PMH of COPD on 2 L at baseline, CHF, CKD, A-fib on Eliquis brought in by EMS from home for concern for altered mental status.  Patient lives at home alone.  He had last spoken to his daughter around 18 this morning when he sounded normal on the phone but then she called on him later and he was not acting right and was confused on the phone.  By the time EMS arrived, he was staggering around and confused and became more somnolent with EMS.  His O2 sat was 60% on 2 L nasal cannula.  They placed him on nonrebreather with improvement of sats but worsening mental status.  Patient too somnolent and altered to provide any history at all.  Spoke with patient's daughter Zachary Hawkins who is listed in patient's chart.  Apparently her other sister checked up on patient today and found him naked confused on the floor complaining of difficulty breathing.  He does live at home with visiting home health but is not necessarily always compliant with medications.  When the daughters are visiting they make sure he is taking his medications but they do not think he is always taking it properly.  From what she knows she was not aware that he has been sick recently she has no known knowledge of him having fevers or productive cough or other complaints.   Altered Mental Status      Home Medications Prior to Admission medications   Medication Sig Start Date End Date Taking? Authorizing Provider  ADVAIR HFA 115-21 MCG/ACT inhaler INHALE 2 PUFFS INTO THE LUNGS 2 (TWO) TIMES DAILY. 07/23/20   Charlott Rakes, MD  albuterol (VENTOLIN HFA) 108 (90 Base) MCG/ACT inhaler Inhale 2 puffs into the lungs every 4 (four) hours as needed for wheezing or shortness of breath. 02/13/21   Croitoru,  Mihai, MD  apixaban (ELIQUIS) 5 MG TABS tablet Take 1 tablet (5 mg total) by mouth 2 (two) times daily. 02/02/22   Larey Dresser, MD  atorvastatin (LIPITOR) 10 MG tablet Take 1 tablet (10 mg total) by mouth daily. 02/02/22   Larey Dresser, MD  bisoprolol (ZEBETA) 5 MG tablet Take 0.5 tablets (2.5 mg total) by mouth daily. 02/02/22   Larey Dresser, MD  budesonide (PULMICORT) 0.25 MG/2ML nebulizer solution Use 1 vial (2 mLs) by nebulization 2 times daily. 08/21/21   Nita Sells, MD  dapagliflozin propanediol (FARXIGA) 10 MG TABS tablet Take 1 tablet (10 mg total) by mouth daily. 02/02/22   Larey Dresser, MD  doxazosin (CARDURA) 2 MG tablet Take 1 tablet by mouth daily. 02/02/22   Larey Dresser, MD  EPINEPHrine 0.3 mg/0.3 mL IJ SOAJ injection Inject 0.3 mg into the muscle as needed for anaphylaxis. 01/09/21   Bonnielee Haff, MD  famotidine (PEPCID) 20 MG tablet Take 1 tablet (20 mg total) by mouth 2 (two) times daily. 01/17/21 04/05/42  Noemi Chapel P, DO  fluticasone (FLONASE) 50 MCG/ACT nasal spray PLACE 1 SPRAY INTO BOTH NOSTRILS DAILY. 01/18/22   Maryjane Hurter, MD  mupirocin ointment (BACTROBAN) 2 % Place 1 application. into the nose 2 (two) times daily. Patient taking differently: Place 1 application  into the nose daily as needed. 10/11/21  Dana Allan I, MD  pantoprazole (PROTONIX) 40 MG tablet Take 1 tablet (40 mg total) by mouth daily. 10/11/21 04/06/23  Dana Allan I, MD  polyethylene glycol (MIRALAX / GLYCOLAX) 17 g packet Take 17 g by mouth 2 (two) times daily. Patient taking differently: Take 17 g by mouth 2 (two) times daily. As needed 10/11/21   Dana Allan I, MD  potassium chloride SA (KLOR-CON M) 20 MEQ tablet Take 1 tablet (20 mEq total) by mouth daily. 04/06/22   Milford, Maricela Bo, FNP  saline (AYR) GEL Place 1 application into both nostrils at bedtime. Patient taking differently: Place 1 application  into both nostrils at bedtime as needed (for  congestion). 07/09/21   Maryjane Hurter, MD  spironolactone (ALDACTONE) 25 MG tablet Take 1 tablet (25 mg total) by mouth daily. 02/02/22   Larey Dresser, MD  tamsulosin (FLOMAX) 0.4 MG CAPS capsule Take 1 capsule (0.4 mg total) by mouth daily after supper. 03/16/21   Pokhrel, Corrie Mckusick, MD  torsemide (DEMADEX) 20 MG tablet Patient takes 4 tablets by mouth daily.    [provider]      Allergies    Ace inhibitors, Black cherry fruit extract Marcelline Mates extract], and Naval architect agent]    Review of Systems   Review of Systems  Physical Exam Updated Vital Signs BP (!) 103/59   Pulse (!) 127   Temp 99.5 F (37.5 C) (Axillary)   Resp (!) 32   Ht 6\' 2"  (1.88 m)   SpO2 100%   BMI 44.06 kg/m  Physical Exam Constitutional: Ill-appearing gentleman, very somnolent, GCS 6 (E-1, M-4, V-1) Eyes: Conjunctivae are normal. ENT      Head: Normocephalic and atraumatic.      Nose: No congestion.      Mouth/Throat: Mucous membranes are moist.      Neck: No stridor. Cardiovascular: S1, S2, tachycardic, regular rhythm, equal palpable DP pulses normal and symmetric distal pulses are present in all extremities.Warm and well perfused. Respiratory: Tachypneic, breath sounds severely limited by body habitus, faint rales auscultated bilaterally, O2 sat 99% on nonrebreather Gastrointestinal: Very distended, nontender Musculoskeletal:  1+ equal bilateral pitting edema extending from foot to knee bilaterally Neurologic: GCS 6 (E-1, M-4, V-1), altered Skin: Skin is warm, dry and intact. No rash noted. Psychiatric: somnolent  ED Results / Procedures / Treatments   Labs (all labs ordered are listed, but only abnormal results are displayed) Labs Reviewed  CBC WITH DIFFERENTIAL/PLATELET - Abnormal; Notable for the following components:      Result Value   MCHC 26.8 (*)    RDW 17.6 (*)    nRBC 4.5 (*)    Neutro Abs 8.0 (*)    Lymphs Abs 0.6 (*)    nRBC 6 (*)    Abs Immature  Granulocytes 0.20 (*)    All other components within normal limits  COMPREHENSIVE METABOLIC PANEL - Abnormal; Notable for the following components:   Potassium 5.8 (*)    Chloride 95 (*)    CO2 36 (*)    Glucose, Bld 125 (*)    Creatinine, Ser 1.25 (*)    Calcium 8.6 (*)    All other components within normal limits  TROPONIN I (HIGH SENSITIVITY) - Abnormal; Notable for the following components:   Troponin I (High Sensitivity) 25 (*)    All other components within normal limits  RESP PANEL BY RT-PCR (RSV, FLU A&B, COVID)  RVPGX2  RESPIRATORY PANEL BY PCR  ETHANOL  MAGNESIUM  BRAIN NATRIURETIC PEPTIDE  RAPID URINE DRUG SCREEN, HOSP PERFORMED  URINALYSIS, ROUTINE W REFLEX MICROSCOPIC  BASIC METABOLIC PANEL  HEMOGLOBIN A1C  PATHOLOGIST SMEAR REVIEW  I-STAT ARTERIAL BLOOD GAS, ED  TROPONIN I (HIGH SENSITIVITY)    EKG EKG Interpretation  Date/Time:  Tuesday May 04 2022 12:48:32 EST Ventricular Rate:  108 PR Interval:  197 QRS Duration: 88 QT Interval:  332 QTC Calculation: 445 R Axis:   -1 Text Interpretation: Sinus tachycardia Anterior infarct, old Borderline repolarization abnormality More pronounced T wave inversions lateral leads I and aVL compared to prior Confirmed by Georgina Snell [phone]) on 05/04/2022 1:08:11 PM  Radiology DG Abdomen 1 View  Result Date: 05/04/2022 CLINICAL DATA:  Nasogastric tube placement EXAM: ABDOMEN - 1 VIEW COMPARISON:  Portable exam 1355 hours compared to 10/09/2021 FINDINGS: Nasogastric tube traverses stomach with tip projecting over distal gastric antrum. Patient rotated to the RIGHT. Nonobstructive bowel gas pattern. Subsegmental atelectasis LEFT lung base. IMPRESSION: Tip of nasogastric tube projects over distal gastric antrum. Electronically Signed   By: Lavonia Dana M.D.   On: 05/04/2022 14:13   DG Chest Portable 1 View  Result Date: 05/04/2022 CLINICAL DATA:  ETT placement EXAM: PORTABLE CHEST 1 VIEW COMPARISON:  Oct 06, 2021  KUB FINDINGS: An ET tube is been placed in the interval, terminating in the mid trachea, in good position. Stable cardiomegaly. The hila and mediastinum are unchanged. No pneumothorax. Bibasilar opacities demonstrate streaky platelike character. No nodules or masses. No other acute abnormalities. IMPRESSION: 1. The ET tube terminates in the mid trachea, in good position. 2. Platelike and streaky opacities in the bases favored represent atelectasis rather than pneumonia or aspiration. Electronically Signed   By: Dorise Bullion III M.D.   On: 05/04/2022 13:36    Procedures .Critical Care  Performed by: Elgie Congo, MD Authorized by: Elgie Congo, MD   Critical care provider statement:    Critical care time (minutes):  50   Critical care was necessary to treat or prevent imminent or life-threatening deterioration of the following conditions:  Respiratory failure   Critical care was time spent personally by me on the following activities:  Development of treatment plan with patient or surrogate, discussions with consultants, evaluation of patient's response to treatment, examination of patient, ordering and review of laboratory studies, ordering and review of radiographic studies, ordering and performing treatments and interventions, pulse oximetry, re-evaluation of patient's condition, review of old charts and obtaining history from patient or surrogate   Care discussed with: admitting provider   Procedure Name: Intubation Date/Time: 05/04/2022 1:53 PM  Performed by: Elgie Congo, MDPre-anesthesia Checklist: Patient identified Oxygen Delivery Method: Non-rebreather mask Preoxygenation: Pre-oxygenation with 100% oxygen Induction Type: Rapid sequence and Cricoid Pressure applied Laryngoscope Size: Glidescope and 4 Grade View: Grade III Tube size: 7.5 mm Number of attempts: 1 Airway Equipment and Method: Video-laryngoscopy Placement Confirmation: ETT inserted through vocal  cords under direct vision, Positive ETCO2, Breath sounds checked- equal and bilateral and CO2 detector Secured at: 27 cm Tube secured with: ETT holder Difficulty Due To: Difficult Airway-due to vocal cord/laryngeal edema and Difficulty was anticipated Future Recommendations: Recommend- induction with short-acting agent, and alternative techniques readily available        Medications Ordered in ED Medications  succinylcholine (ANECTINE) 200 MG/10ML syringe (  Not Given 05/04/22 1337)  etomidate (AMIDATE) injection (20 mg Intravenous Given 05/04/22 1319)  rocuronium (ZEMURON) injection (100 mg Intravenous Given 05/04/22 1319)  sodium zirconium cyclosilicate (LOKELMA) packet 10  g (has no administration in time range)  calcium gluconate 1 g/ 50 mL sodium chloride IVPB (has no administration in time range)  docusate (COLACE) 50 MG/5ML liquid 100 mg (has no administration in time range)  insulin aspart (novoLOG) injection 0-15 Units (has no administration in time range)  arformoterol (BROVANA) nebulizer solution 15 mcg (has no administration in time range)  revefenacin (YUPELRI) nebulizer solution 175 mcg (has no administration in time range)  budesonide (PULMICORT) nebulizer solution 0.5 mg (has no administration in time range)  methylPREDNISolone sodium succinate (SOLU-MEDROL) 125 mg/2 mL injection 125 mg (has no administration in time range)  apixaban (ELIQUIS) tablet 5 mg (has no administration in time range)  famotidine (PEPCID) tablet 20 mg (has no administration in time range)  atorvastatin (LIPITOR) tablet 10 mg (has no administration in time range)  polyethylene glycol (MIRALAX / GLYCOLAX) packet 17 g (has no administration in time range)  dexmedetomidine (PRECEDEX) 400 MCG/100ML (4 mcg/mL) infusion (has no administration in time range)  fentaNYL (SUBLIMAZE) injection 50 mcg (has no administration in time range)  fentaNYL (SUBLIMAZE) injection 50-200 mcg (has no administration in  time range)  naloxone [facility]) injection 0.4 mg (0.4 mg Intravenous Given 05/04/22 1248)  propofol (DIPRIVAN) 1000 MG/100ML infusion (  New Bag/Given 05/04/22 1337)  furosemide (LASIX) injection 80 mg (80 mg Intravenous Given 05/04/22 1402)    ED Course/ Medical Decision Making/ A&P Clinical Course as of 05/04/22 1435  Tue May 04, 2022  1340 Updated patient's daughter Zachary Hawkins listed in the chart.  Patient's daughter aware that patient has been intubated. [VB]  [id number] Discussed case with ICU team who will be down to evaluate the patient for admission.  Labs still pending CT head still pending. [VB]    Clinical Course User Index [VB] Elgie Congo, MD                           Medical Decision Making KAEGEN NOVAKOVICH is a 66 y.o. male.  With PMH of COPD on 2 L at baseline, CHF, CKD, A-fib on Eliquis brought in by EMS from home for concern for altered mental status.  Patient found to be satting 60% on 2 L nasal cannula by EMS.  Patient's presentation concerning for severe altered mental status in the setting of hypoxia and hypercarbia and respiratory failure.  ABG obtained upon arrival pH 7.127, pCO2 greater than 110, PaO2 98.  Patient's GCS 6 on arrival.  Due to severe hypercarbia and depressed mental status, patient intubated for respiratory failure and airway protection.  He was a very difficult airway due to severely narrow epiglottis and vocal cords.  There was significant frothing of clear sputum on intubation with significant abdominal distention and trace pitting edema of the lower extremities, favor fluid overload contributing.  Diuresed with IV Lasix 80 mg.  Chest x-ray obtained which I personally reviewed, favor more likely pleural effusion on the right however read by radiology as streaky opacities bibasilar lobes favoring atelectasis.    Labs reviewed by me, no leukocytosis white blood cell count 10.0.  Normal hemoglobin 13.2.  Mild hyperkalemia 5.8 with creatinine 1.25.  Bicarbonate  36.  High sensitive troponin slightly elevated 25 likely demand ischemia in the setting of respiratory failure.  Patient's case discussed with ICU team and admitted to ICU for further workup and management.  CT head was pending at time of admission.  Unlikely head bleed with known cause of hypercarbia likely contributing to  altered mental status.  Admitted under Dr. Katrinka Blazing of ICU.  Amount and/or Complexity of Data Reviewed Labs: ordered. Radiology: ordered.  Risk Prescription drug management. Decision regarding hospitalization.    Final Clinical Impression(s) / ED Diagnoses Final diagnoses:  Hypercarbia  Altered mental status, unspecified altered mental status type    Rx / DC Orders ED Discharge Orders     None         Mardene Sayer, MD 05/04/22 1435

## 2022-05-04 NOTE — Plan of Care (Signed)

## 2022-05-04 NOTE — ED Triage Notes (Signed)
Pt BIB EMS after daughter called for AMS. Daughter said he sounded normal at 0745 on the phone but did not sound the same around noon. Was able to answer questions with EMS but in the ambulance stopped being responsive around 1215. Hx of CHF and on 2L [place] at baseline.

## 2022-05-04 NOTE — Procedures (Signed)
Intubation Procedure Note  Zachary Hawkins  [id number]  [birth year]  Date:05/04/22  Time:1:48 PM   Provider Performing:Rayvon Brandvold, Tennis Must    Procedure: Intubation (31500)  Indication(s) Respiratory Failure  Consent Unable to obtain consent due to emergent nature of procedure.   Anesthesia    Time Out Verified patient identification, verified procedure, site/side was marked, verified correct patient position, special equipment/implants available, medications/allergies/relevant history reviewed, required imaging and test results available.   Sterile Technique Usual hand hygeine, masks, and gloves were used   Procedure Description Patient positioned in bed supine.  Sedation given as noted above.  Patient was intubated with endotracheal tube using Glidescope.  View was Grade 1 full glottis .  Number of attempts was 1.  Colorimetric CO2 detector was consistent with tracheal placement.   Complications/Tolerance None; patient tolerated the procedure well. Chest X-ray is ordered to verify placement.   EBL Minimal   Specimen(s) None

## 2022-05-05 ENCOUNTER — Inpatient Hospital Stay (HOSPITAL_COMMUNITY): Payer: Medicaid Other

## 2022-05-05 DIAGNOSIS — R0689 Other abnormalities of breathing: Secondary | ICD-10-CM | POA: Diagnosis not present

## 2022-05-05 LAB — URINE CULTURE: Culture: NO GROWTH

## 2022-05-05 LAB — CBC
HCT: 49.6 % (ref 39.0–52.0)
Hemoglobin: 13.9 g/dL (ref 13.0–17.0)
MCH: 25.6 pg — ABNORMAL LOW (ref 26.0–34.0)
MCHC: 28 g/dL — ABNORMAL LOW (ref 30.0–36.0)
MCV: 91.2 fL (ref 80.0–100.0)
Platelets: 258 10*3/uL (ref 150–400)
RBC: 5.44 MIL/uL (ref 4.22–5.81)
RDW: 18.2 % — ABNORMAL HIGH (ref 11.5–15.5)
WBC: 9.7 10*3/uL (ref 4.0–10.5)

## 2022-05-05 LAB — GLUCOSE, CAPILLARY
Glucose-Capillary: 147 mg/dL — ABNORMAL HIGH (ref 70–99)
Glucose-Capillary: 118 mg/dL — ABNORMAL HIGH (ref 70–99)
Glucose-Capillary: 129 mg/dL — ABNORMAL HIGH (ref 70–99)

## 2022-05-05 LAB — BASIC METABOLIC PANEL
Anion gap: 14 (ref 5–15)
BUN: 21 mg/dL (ref 8–23)
CO2: 32 mmol/L (ref 22–32)
Calcium: 9.4 mg/dL (ref 8.9–10.3)
Chloride: 93 mmol/L — ABNORMAL LOW (ref 98–111)
Creatinine, Ser: 1.53 mg/dL — ABNORMAL HIGH (ref 0.61–1.24)
GFR, Estimated: 50 mL/min — ABNORMAL LOW (ref >60)
Glucose, Bld: 120 mg/dL — ABNORMAL HIGH (ref 70–99)
Potassium: 4.4 mmol/L (ref 3.5–5.1)
Sodium: 139 mmol/L (ref 135–145)

## 2022-05-05 LAB — PHOSPHORUS
Phosphorus: 1 mg/dL — CL (ref 2.5–4.6)
Phosphorus: 7.4 mg/dL — ABNORMAL HIGH (ref 2.5–4.6)

## 2022-05-05 LAB — MAGNESIUM: Magnesium: 2.2 mg/dL (ref 1.7–2.4)

## 2022-05-05 LAB — HEMOGLOBIN A1C
Hgb A1c MFr Bld: 7.2 % — ABNORMAL HIGH (ref 4.8–5.6)
Mean Plasma Glucose: 160 mg/dL

## 2022-05-05 LAB — TRIGLYCERIDES: Triglycerides: 182 mg/dL — ABNORMAL HIGH (ref <150)

## 2022-05-05 MED ORDER — SODIUM PHOSPHATES 45 MMOLE/15ML IV SOLN
45.0000 mmol | Freq: Once | INTRAVENOUS | Status: AC
  Administered 2022-05-05: 45 mmol via INTRAVENOUS
  Filled 2022-05-05: qty 15

## 2022-05-05 MED ORDER — BISOPROLOL FUMARATE 5 MG PO TABS
2.5000 mg | ORAL_TABLET | Freq: Every day | ORAL | Status: DC
  Administered 2022-05-05 – 2022-05-08 (×4): 2.5 mg via ORAL
  Filled 2022-05-05 (×3): qty 1
  Filled 2022-05-05: qty 0.5
  Filled 2022-05-05: qty 1

## 2022-05-05 MED ORDER — TAMSULOSIN HCL 0.4 MG PO CAPS
0.4000 mg | ORAL_CAPSULE | Freq: Every day | ORAL | Status: DC
  Administered 2022-05-05 – 2022-05-07 (×3): 0.4 mg via ORAL
  Filled 2022-05-05 (×3): qty 1

## 2022-05-05 NOTE — Progress Notes (Addendum)
eLink Physician-Brief Progress Note Patient Name: Zachary Hawkins DOB: Jun 05, 1955 MRN: [id number]   Date of Service  05/05/2022  HPI/Events of Note  Patient is intubated and needs restraints order to prevent self-extubation.  eICU Interventions  Restraints ordered.        Migdalia Dk 05/05/2022, 6:41 AM

## 2022-05-05 NOTE — Progress Notes (Signed)
eLink Physician-Brief Progress Note Patient Name: Zachary Hawkins DOB: May 31, 1955 MRN: [id number]   Date of Service  05/05/2022  HPI/Events of Note  Phos 1.0, K+ 4.4  eICU Interventions  Phosphorus replaced per E-Link electrolyte replacement .protocol        Migdalia Dk 05/05/2022, 4:07 AM

## 2022-05-05 NOTE — Procedures (Signed)
Extubation Procedure Note  Patient Details:   Name: Zachary Hawkins DOB: [birth year] MRN: [id number]   Airway Documentation:    Vent end date: 05/05/22 Vent end time: 1038   Evaluation  O2 sats: currently acceptable Complications: No apparent complications Patient did tolerate procedure well. Bilateral Breath Sounds: Rhonchi, Diminished   Patient extubated per MD order & placed on 6L [place]. SpO2 fell to 84%, so placed on [place] HFNC at 12L & SpO2 rose to 95%. Patient is able to speak & cough well post extubation.  [name] 05/05/2022, 10:47 AM

## 2022-05-05 NOTE — H&P (Addendum)
NAME:  Zachary Hawkins, MRN:  [id number], DOB:  02/21/1956, LOS: 1 ADMISSION DATE:  05/04/2022, CONSULTATION DATE:  12/26 REFERRING MD:  Dr. Elpidio Anis, CHIEF COMPLAINT:  Acute respiratory    History of Present Illness:  Patient is a 66 year old male with pertinent PMH of OSA, H DOS, COPD on 2 LNC, grade 2 diastolic CHF, CKD 3, A-fib on Eliquis presents to [facility] ED on 12/26 with acute respiratory failure with hypoxia/hypercapnia.  Patient lives at home alone.  Patient's daughter spoke to patient on phone on 12/26 at 7:45 AM and was normal.  Later today patient's other daughter went to visit patient and found him naked and confused complaining of SOB.  This daughter states that patient is not compliant with medications.  EMS called and found patient with sats 60% on 2 LNC.  Patient confused.  Placed on nonrebreather and transferred to [facility].  On arrival to [facility] ED on 12/26, patient very altered.  Sats improved with NRB.  Mild tachycardia and BP 152/97.  ABG 7.12, PaCO2 110, PaO2 98.  Patient was intubated.  Frothy secretions appreciated during intubation and trace BLE edema.  Lasix ordered.  CXR showing streaky opacities bilateral bases likely atelectasis versus pneumonia.  PCCM consulted for admission.  Pertinent ED labs: K5.8, creat 1.25 (baseline 1.3), ethanol WNL, BNP pending, troponin 25, UDS pending, UA pending, flu/COVID/RSV pending, RVP pending   Pertinent  Medical History   Past Medical History:  Diagnosis Date   Asthma    BPH (benign prostatic hyperplasia)    Cardiomyopathy (HCC) 06/2017   EF 40-45%   CHF (congestive heart failure) (HCC)    Chronic renal insufficiency, stage 3 (moderate) (HCC)    COPD (chronic obstructive pulmonary disease) (HCC)    Diastolic dysfunction 06/2017   grade 2    Hyperlipidemia    Hypertension    Hypertensive urgency    Obesity    Obesity    OSA (obstructive sleep apnea)    Paroxysmal atrial fibrillation (HCC) 2022   on Eliquis     Significant  Hospital Events: Including procedures, antibiotic start and stop dates in addition to other pertinent events   12/26: AMS; found hypoxic and hypercarbic; intubated  Interim History / Subjective:  Awake on vent, agitated.  Objective   Blood pressure (!) 133/95, pulse (!) 101, temperature 99 F (37.2 C), temperature source Bladder, resp. rate (!) 24, height 6\' 2"  (1.88 m), weight (!) 154.1 kg, SpO2 95 %.    Vent Mode: PCV FiO2 (%):  [50 %-100 %] 50 % Set Rate:  [24 bmp-32 bmp] 24 bmp Vt Set:  [620 mL] 620 mL PEEP:  [8 cmH20-10 cmH20] 8 cmH20 Plateau Pressure:  [26 cmH20-28 cmH20] 26 cmH20   Intake/Output Summary (Last 24 hours) at 05/05/2022 05/07/2022 Last data filed at 05/05/2022 0800 Gross per 24 hour  Intake 1319 ml  Output 2670 ml  Net -1351 ml   Filed Weights   05/04/22 1400 05/04/22 1730 05/05/22 0318  Weight: (!) 153.3 kg (!) 155 kg (!) 154.1 kg    Examination: Chronically ill appearing man on vent Lungs diminished bases Chronic venous stasis changes ETT minimal secretions Less bronchospasm on vent waveform Following commands  CBG ok Cr ok Phos being repleted Pct neg Sugars ok  Resolved Hospital Problem list   HyperK- improved  Assessment & Plan:  Acute on chronic hypercarpneic and hypoxemic respiratory failure in patient with baseline difficult to control volume status and COPD.  Tx as both AECOPD and volume  overload. Class 3 obesity Metabolic syndrome Afib on AC PTA Abnormal CT head- OP f/u can be considered Hypercarbic encephalopathy- resolved Noncompliance with CPAP, OSA  - Continue diuresis as tolerated by renal function - SAT/SBT potential extubation to BIPAP - Continue steroids, nebs - f/u tracheal aspirate  Best Practice (right click and "Reselect all SmartList Selections" daily)   Diet/type:TF if not extubatable DVT prophylaxis: DOAC GI prophylaxis: H2B Lines: N/A Foley:  N/A Code Status:  full code Last date of multidisciplinary goals of  care discussion [12/26 spoke with daughter Joycie Peek who is a Designer, jewellery in [place], [place] and updated over phone. States patient is non compliant with medications and does not wear cpap like he should. ]  32 min cc time Erskine Emery MD PCCM

## 2022-05-05 NOTE — Progress Notes (Incomplete)
Initial Nutrition Assessment  DOCUMENTATION CODES:      INTERVENTION:  ***   NUTRITION DIAGNOSIS:     related to   as evidenced by  .  GOAL:      MONITOR:      REASON FOR ASSESSMENT:   Ventilator    ASSESSMENT:       Medications reviewed and include: ***  Labs reviewed: ***     NUTRITION - FOCUSED PHYSICAL EXAM:  {RD Focused Exam List:21252}  Diet Order:   Diet Order             Diet NPO time specified  Diet effective now                   EDUCATION NEEDS:      Skin:     Last BM:     Height:   Ht Readings from Last 1 Encounters:  05/05/22 6\' 2"  (1.88 m)    Weight:   Wt Readings from Last 1 Encounters:  05/05/22 (!) 154.1 kg    BMI:  Body mass index is 43.62 kg/m.  Estimated Nutritional Needs:   Kcal:     Protein:     Fluid:     Zachary Uhlir P., RD, LDN, CNSC See AMiON for contact information

## 2022-05-05 NOTE — Progress Notes (Signed)
Patient removed safety mitts and PIV. Elink notifed, safety mitts placed back on, PRN meds given to maintain RASS, and patient educated on need to keep mitts on and not pull out any lines.

## 2022-05-06 DIAGNOSIS — J441 Chronic obstructive pulmonary disease with (acute) exacerbation: Secondary | ICD-10-CM | POA: Diagnosis not present

## 2022-05-06 DIAGNOSIS — G9341 Metabolic encephalopathy: Secondary | ICD-10-CM | POA: Diagnosis not present

## 2022-05-06 DIAGNOSIS — N1831 Chronic kidney disease, stage 3a: Secondary | ICD-10-CM | POA: Insufficient documentation

## 2022-05-06 DIAGNOSIS — I5033 Acute on chronic diastolic (congestive) heart failure: Secondary | ICD-10-CM | POA: Diagnosis not present

## 2022-05-06 DIAGNOSIS — I10 Essential (primary) hypertension: Secondary | ICD-10-CM

## 2022-05-06 DIAGNOSIS — J9621 Acute and chronic respiratory failure with hypoxia: Secondary | ICD-10-CM

## 2022-05-06 DIAGNOSIS — J9622 Acute and chronic respiratory failure with hypercapnia: Secondary | ICD-10-CM

## 2022-05-06 LAB — BASIC METABOLIC PANEL
Anion gap: 8 (ref 5–15)
BUN: 29 mg/dL — ABNORMAL HIGH (ref 8–23)
CO2: 39 mmol/L — ABNORMAL HIGH (ref 22–32)
Calcium: 8 mg/dL — ABNORMAL LOW (ref 8.9–10.3)
Chloride: 95 mmol/L — ABNORMAL LOW (ref 98–111)
Creatinine, Ser: 1.55 mg/dL — ABNORMAL HIGH (ref 0.61–1.24)
GFR, Estimated: 50 mL/min — ABNORMAL LOW (ref >60)
Glucose, Bld: 113 mg/dL — ABNORMAL HIGH (ref 70–99)
Potassium: 4.2 mmol/L (ref 3.5–5.1)
Sodium: 142 mmol/L (ref 135–145)

## 2022-05-06 LAB — CULTURE, RESPIRATORY W GRAM STAIN: Culture: NORMAL

## 2022-05-06 LAB — PHOSPHORUS: Phosphorus: 6.1 mg/dL — ABNORMAL HIGH (ref 2.5–4.6)

## 2022-05-06 LAB — CBC
HCT: 48.3 % (ref 39.0–52.0)
Hemoglobin: 13.7 g/dL (ref 13.0–17.0)
MCH: 26.6 pg (ref 26.0–34.0)
MCHC: 28.4 g/dL — ABNORMAL LOW (ref 30.0–36.0)
MCV: 93.6 fL (ref 80.0–100.0)
Platelets: 246 10*3/uL (ref 150–400)
RBC: 5.16 MIL/uL (ref 4.22–5.81)
RDW: 18.2 % — ABNORMAL HIGH (ref 11.5–15.5)
WBC: 10.5 10*3/uL (ref 4.0–10.5)
nRBC: 1.5 % — ABNORMAL HIGH (ref 0.0–0.2)

## 2022-05-06 LAB — MAGNESIUM: Magnesium: 2.3 mg/dL (ref 1.7–2.4)

## 2022-05-06 LAB — PATHOLOGIST SMEAR REVIEW

## 2022-05-06 MED ORDER — SPIRONOLACTONE 25 MG PO TABS
25.0000 mg | ORAL_TABLET | Freq: Every day | ORAL | Status: DC
  Administered 2022-05-06 – 2022-05-08 (×3): 25 mg via ORAL
  Filled 2022-05-06 (×3): qty 1

## 2022-05-06 MED ORDER — ACETAMINOPHEN 500 MG PO TABS
1000.0000 mg | ORAL_TABLET | Freq: Three times a day (TID) | ORAL | Status: DC | PRN
  Administered 2022-05-06: 1000 mg via ORAL
  Filled 2022-05-06: qty 2

## 2022-05-06 NOTE — Assessment & Plan Note (Signed)
BP stable - Continue bisoprolol, Lasix - Resume spironolactone

## 2022-05-06 NOTE — TOC Initial Note (Signed)
Transition of Care [facility]) - Initial/Assessment Note    Patient Details  Name: Zachary Hawkins MRN: [id number] Date of Birth: [birth year]  Transition [facility] Care [facility]) CM/SW Contact:    Kermit Balo, RN Phone Number: 05/06/2022, 12:04 PM  Clinical Narrative:                 Pt is from home alone. States his family (daughter and son) can check on him.  Pt uses oxygen 2-3L at home from [place]. Pt is interested in seeing if he qualifies for portable concentrator at home. CM  has called [employer] and entered the order they need to f/u with him as outpt. They will also f/u with his pulmonologist.  Pt drives self as needed.  He denies any issues with home medications and uses [facility] that delivers his meds.  Recommendations are for [facility] PT. Pt will not get [facility] PT with medicaid, or very little visits. Pt states he has been to outpt therapy at [facility] and would like to attend there again. CM will place orders in Epic and information on the AVS.  TOC following.  Expected Discharge Plan: OP Rehab Barriers to Discharge: Continued Medical Work up   Patient Goals and CMS Choice     Choice offered to / list presented to : Patient      Expected Discharge Plan and Services   Discharge Planning Services: CM Consult   Living arrangements for the past 2 months: Single Family Home                                      Prior Living Arrangements/Services Living arrangements for the past 2 months: Single Family Home Lives with:: Self Patient language and need for interpreter reviewed:: Yes Do you feel safe going back to the place where you live?: Yes          Current home services: DME (oxygen with [employer]) Criminal Activity/Legal Involvement Pertinent to Current Situation/Hospitalization: No - Comment as needed  Activities of Daily Living Home Assistive Devices/Equipment: BIPAP ADL Screening (condition at time of admission) Patient's cognitive ability adequate to safely complete  daily activities?: No Is the patient deaf or have difficulty hearing?: No Does the patient have difficulty seeing, even when wearing glasses/contacts?: No Does the patient have difficulty concentrating, remembering, or making decisions?: Yes Patient able to express need for assistance with ADLs?: No Does the patient have difficulty dressing or bathing?: Yes Independently performs ADLs?: No Communication: Dependent Is this a change from baseline?: Change from baseline, expected to last <3 days Dressing (OT): Dependent Is this a change from baseline?: Change from baseline, expected to last <3days Grooming: Dependent Is this a change from baseline?: Change from baseline, expected to last <3 days Feeding: Dependent Is this a change from baseline?: Change from baseline, expected to last <3 days Bathing: Dependent Is this a change from baseline?: Change from baseline, expected to last <3 days Toileting: Dependent Is this a change from baseline?: Change from baseline, expected to last <3 days In/Out Bed: Dependent Is this a change from baseline?: Change from baseline, expected to last <3 days Walks in Home: Dependent Is this a change from baseline?: Change from baseline, expected to last <3 days Does the patient have difficulty walking or climbing stairs?: Yes Weakness of Legs: Both Weakness of Arms/Hands: Both  Permission Sought/Granted  Emotional Assessment Appearance:: Appears stated age Attitude/Demeanor/Rapport: Engaged Affect (typically observed): Accepting Orientation: : Oriented to Self, Oriented to Place, Oriented to  Time, Oriented to Situation   Psych Involvement: No (comment)  Admission diagnosis:  Respiratory failure (HCC) [J96.90] Hypercarbia [R06.89] Altered mental status, unspecified altered mental status type [R41.82] Patient Active Problem List   Diagnosis Date Noted   Stage 3a chronic kidney disease (CKD) (HCC) 05/06/2022   ARF (acute renal  failure) (HCC) 10/07/2021   SIRS (systemic inflammatory response syndrome) (HCC) 10/06/2021   Acute encephalopathy    Chronic diastolic heart failure (HCC)    Chronic obstructive pulmonary disease with acute exacerbation (HCC)    Hyperkalemia    Class 3 obesity (HCC) 07/18/2021   Acute on chronic diastolic heart failure (HCC) 07/17/2021   BPH (benign prostatic hyperplasia)    Hyperlipidemia    Acute on chronic respiratory failure with hypoxia and hypercapnia (HCC) 06/23/2021   Chronic diastolic CHF (congestive heart failure) (HCC) 06/23/2021   Acute on chronic diastolic (congestive) heart failure (HCC) 04/30/2021   AKI (acute kidney injury) (HCC) 04/29/2021   Chronic respiratory failure with hypercapnia (HCC) 04/29/2021   Bilateral hydrocele 04/29/2021   Dyspnea on exertion 04/29/2021   Acute respiratory failure with hypoxia (HCC) 03/10/2021   Urticaria 01/15/2021   Chronic heart failure with preserved ejection fraction (HCC)    Angioedema 01/04/2021   Influenza vaccine refused 06/26/2020   COVID-19 vaccine series completed 06/26/2020   Paroxysmal atrial fibrillation (HCC) 07/02/2019   Secondary hypercoagulable state (HCC) 07/02/2019   OSA (obstructive sleep apnea) 04/03/2019   History of angioedema due to ACE INHIBITORS 10/13/2017   Non compliance w medication regimen 07/13/2017   Essential hypertension 06/23/2017   Acute kidney injury superimposed on chronic kidney disease (HCC)    Cardiomyopathy (HCC) 06/06/2017   Dyspnea 06/05/2017   PCP:  Sebastian Ache, PA-C Pharmacy:   [facility] [address] [place] [address] Phone: [phone] Fax: [phone]  [facility] - [place], [place] - [address] [place] [address] Phone: [phone] Fax: [phone]     Social Determinants of Health (SDOH) Social History: SDOH Screenings   Food Insecurity: No Food Insecurity (07/15/2021)   Housing: Low Risk  (07/15/2021)  Transportation Needs: No Transportation Needs (07/15/2021)  Depression (PHQ2-9): Low Risk  (06/26/2020)  Financial Resource Strain: High Risk (07/15/2021)  Tobacco Use: Medium Risk (05/04/2022)   SDOH Interventions:     Readmission Risk Interventions    06/25/2021    3:22 PM 05/04/2021   10:03 AM 03/13/2021   12:35 PM  Readmission Risk Prevention Plan  Transportation Screening Complete Complete Complete  PCP or Specialist Appt within 3-5 Days  Complete   HRI or Home Care Consult  Complete   Social Work Consult for Recovery Care Planning/Counseling  Complete   Palliative Care Screening  Not Applicable   Medication Review Oceanographer) Complete Complete Complete  PCP or Specialist appointment within 3-5 days of discharge Complete  Complete  HRI or Home Care Consult Complete  Complete  SW Recovery Care/Counseling Consult Complete  Complete  Palliative Care Screening Not Applicable  Not Applicable  Skilled Nursing Facility Complete  Complete

## 2022-05-06 NOTE — Assessment & Plan Note (Signed)
Continue atorvastatin

## 2022-05-06 NOTE — Assessment & Plan Note (Signed)
-   Continue Solu-Medrol - Continue ICS/LABA/LAMA/LAMA and PRN SABA - Continue pepcid

## 2022-05-06 NOTE — Plan of Care (Signed)
  Problem: Coping: Goal: Ability to adjust to condition or change in health will improve Outcome: Progressing   

## 2022-05-06 NOTE — Evaluation (Signed)
Physical Therapy Evaluation Patient Details Name: Zachary Hawkins MRN: [id number] DOB: 1955/10/29 Today's Date: 05/06/2022  History of Present Illness  Pt adm 05/04/22 with copd exacerbation and respiratory failure. Intubated 12/26 and extubated 12/27.  PMH - COPD with chronic hypoxemic respiratory failure, diastolic heart failure, htn, paroxysmal fibrillation, ckd, morbid obesity  Clinical Impression  PTA pt living alone in single story home with 4 steps to enter. Pt reports independence with mobility, ADLs, iADLs and driving. Pt does report increased fatigue with increased mobilization. Pt is mod I for bed mobility and transfers on 5L O2 via [place]. PT recommending Rollator for energy conservation with progression of COPD and HHPT to improve strength and endurance. PT will continue to follow acutely.     Recommendations for follow up therapy are one component of a multi-disciplinary discharge planning process, led by the attending physician.  Recommendations may be updated based on patient status, additional functional criteria and insurance authorization.  Follow Up Recommendations Home health PT      Assistance Recommended at Discharge Intermittent Supervision/Assistance  Patient can return home with the following  Assist for transportation;Help with stairs or ramp for entrance;Direct supervision/assist for medications management;Assistance with cooking/housework;A little help with walking and/or transfers;A little help with bathing/dressing/bathroom    Equipment Recommendations Rollator (4 wheels) (for progression of COPD)     Functional Status Assessment Patient has had a recent decline in their functional status and demonstrates the ability to make significant improvements in function in a reasonable and predictable amount of time.     Precautions / Restrictions Restrictions Weight Bearing Restrictions: No      Mobility  Bed Mobility Overal bed mobility: Needs Assistance Bed  Mobility: Supine to Sit     Supine to sit: Modified independent (Device/Increase time), HOB elevated     General bed mobility comments: HoB elevated and use of bed rails to come to EoB, moves predominently with momentum    Transfers Overall transfer level: Modified independent Equipment used: None               General transfer comment: good power up and self steadying at EoB, slightly increased WoB in standing, able to stand step pivot to recliner with no assist    Ambulation/Gait               General Gait Details: deferred, pt reported he had just walked to bathroom        Balance Overall balance assessment: Mild deficits observed, not formally tested                                           Pertinent Vitals/Pain Pain Assessment Pain Assessment: No/denies pain    Home Living Family/patient expects to be discharged to:: Private residence Living Arrangements: Alone Available Help at Discharge: Family;Friend(s);Available PRN/intermittently Type of Home: House Home Access: Stairs to enter Entrance Stairs-Rails: None Entrance Stairs-Number of Steps: 4+1   Home Layout: One level Home Equipment: Grab bars - tub/shower;Hand held shower head Additional Comments: Pt on 2-3L O2 at home    Prior Function Prior Level of Function : Independent/Modified Independent             Mobility Comments: ambulates without AD, drives ADLs Comments: independently with ADLs     Hand Dominance   Dominant Hand: Right    Extremity/Trunk Assessment   Upper Extremity Assessment Upper Extremity Assessment:  Defer to OT evaluation    Lower Extremity Assessment Lower Extremity Assessment: Overall WFL for tasks assessed    Cervical / Trunk Assessment Cervical / Trunk Assessment: Other exceptions (large body habitus held in the belly)  Communication   Communication: No difficulties  Cognition Arousal/Alertness: Awake/alert Behavior During  Therapy: WFL for tasks assessed/performed Overall Cognitive Status: Within Functional Limits for tasks assessed                                          General Comments General comments (skin integrity, edema, etc.): Pt on 10L HFNC and numerous extensions for pt to be able to self ambulate to bathroom when needed. on entry with SpO2 98%O2, Able to remove extensions and decrease HFNC to 5L and pt able to maintain SpO2 > 90%O2. Switched to  5L O2 regular [place] with extensions to be able to ambulate to bathroom and pt able to maintain SpO2 92-94%O2.        Assessment/Plan    PT Assessment Patient needs continued PT services  PT Problem List Cardiopulmonary status limiting activity;Decreased activity tolerance       PT Treatment Interventions DME instruction;Gait training;Stair training;Functional mobility training;Therapeutic activities;Therapeutic exercise;Balance training;Cognitive remediation;Patient/family education    PT Goals (Current goals can be found in the Care Plan section)  Acute Rehab PT Goals Patient Stated Goal: go home PT Goal Formulation: With patient Time For Goal Achievement: 05/20/22 Potential to Achieve Goals: Fair    Frequency Min 3X/week     Co-evaluation PT/OT/SLP Co-Evaluation/Treatment: Yes Reason for Co-Treatment: Complexity of the patient's impairments (multi-system involvement)           AM-PAC PT "6 Clicks" Mobility  Outcome Measure Help needed turning from your back to your side while in a flat bed without using bedrails?: None Help needed moving from lying on your back to sitting on the side of a flat bed without using bedrails?: None Help needed moving to and from a bed to a chair (including a wheelchair)?: None Help needed standing up from a chair using your arms (e.g., wheelchair or bedside chair)?: None Help needed to walk in hospital room?: A Little Help needed climbing 3-5 steps with a railing? : A Little 6 Click Score:  22    End of Session Equipment Utilized During Treatment: Oxygen Activity Tolerance: Patient tolerated treatment well Patient left: in chair;with call bell/phone within reach Nurse Communication: Mobility status PT Visit Diagnosis: Muscle weakness (generalized) (M62.81)    Time: 7591-6384 PT Time Calculation (min) (ACUTE ONLY): 36 min   Charges:   PT Evaluation $PT Eval Moderate Complexity: 1 Mod          Dareld Mcauliffe B. Beverely Risen PT, DPT Acute Rehabilitation Services Please use secure chat or  Call Office [phone]   [facility]on [facility] 05/06/2022, 10:15 AM

## 2022-05-06 NOTE — Assessment & Plan Note (Signed)
HR controlled - Continue apixaban - Continue BB

## 2022-05-06 NOTE — Plan of Care (Signed)
  Problem: Education: Goal: Ability to describe self-care measures that may prevent or decrease complications (Diabetes Survival Skills Education) will improve Outcome: Progressing   Problem: Activity: Goal: Risk for activity intolerance will decrease Outcome: Progressing   Problem: Safety: Goal: Ability to remain free from injury will improve Outcome: Progressing   Problem: Safety: Goal: Non-violent Restraint(s) Outcome: Progressing

## 2022-05-06 NOTE — Assessment & Plan Note (Signed)
Due to respiratoryt failure, now resolved.

## 2022-05-06 NOTE — Progress Notes (Signed)
  Progress Note   Patient: Zachary Hawkins KAJ:[id number] DOB: Dec 03, 1955 DOA: 05/04/2022     2 DOS: the patient was seen and examined on 05/06/2022 at 8:55AM      Brief hospital course: Mr. Arviso is a 66 y.o. M with hx MO/OHS/OSA/COPD/sdCHF EF now recovered and chronic respiratory failure on 2-4L at home, CKD IIIa baseline 1.4-1.7, HTN, medication nonadherence and pAF on Eliquis who presented with being found naked, confused, and SOB.  EMS activated and found patient with SpO2 60% on 2L.    12/26: Intubated in ER 12/27: Extubated, transferred OOU  12/28: Still on 10L O2     Assessment and Plan: * Acute on chronic respiratory failure with hypoxia and hypercapnia (HCC) due to heart failure flare, COPD flare, and obesity hypoventilation syndrome    Chronic obstructive pulmonary disease with acute exacerbation (HCC) - Continue Solu-Medrol - Continue ICS/LABA/LAMA/LAMA and PRN SABA - Continue pepcid    Acute on chronic diastolic (congestive) heart failure (HCC) Net negative 800cc yesterday, weight down 1kg. Cr and K stable -Continue Furosemide 80 mg IV twice a day  -Monitor K -Strict I/Os, daily weights, telemetry  -Daily monitoring renal function    Stage 3a chronic kidney disease (CKD) (HCC) Cr stable relative to baseline 1.3-1.5  Acute metabolic encephalopathy Due to respiratoryt failure, now resolved.  Class 3 obesity (HCC) BMI 42  Hyperlipidemia - Continue atorvastatin  Secondary hypercoagulable state (HCC) Due to Afib  Paroxysmal atrial fibrillation (HCC) HR controlled - Continue apixaban - Continue BB  OSA (obstructive sleep apnea) - CPAP at night  Essential hypertension BP stable - Continue bisoprolol, Lasix - Resume spironolactone          Subjective: Patient still having trouble breathing patient, short of breath, still mildly swollen.  No chest pain, palpitation, confusion.     Physical Exam: BP (!) 128/95   Pulse 86   Temp  97.8 F (36.6 C) (Oral)   Resp 19   Ht 6\' 2"  (1.88 m)   Wt (!) 149.9 kg   SpO2 96%   BMI 42.43 kg/m   Obese adult male, lying in bed RRR, no murmurs, mild peripheral edema, JVD noted Respiratory rate seems increased with exertion, lung sounds diminished, no wheezing appreciated Abdomen soft without tenderness palpation or guarding Attention normal, affect normal, judgment insight appear normal, moves all extremities with normal strength and coordination, speech fluent, face symmetric      Data Reviewed: Glucose normal Basic metabolic panel stable, creatinine 1.5, no change from yesterday Comprehensive blood counts no change from yesterday, unremarkable  Family Communication: None present    Disposition: Status is: Inpatient The patient still requires 3 L of oxygen, up from his baseline of 4 L of oxygen, will require ongoing IV Lasix        Author: , MD 05/06/2022 1:57 PM  For on call review [email or web address].

## 2022-05-06 NOTE — Assessment & Plan Note (Signed)
due to heart failure flare, COPD flare, and obesity hypoventilation syndrome

## 2022-05-06 NOTE — Evaluation (Signed)
Occupational Therapy Evaluation Patient Details Name: Zachary Hawkins MRN: [id number] DOB: [birth year] Today's Date: 05/06/2022   History of Present Illness Pt adm 05/04/22 with copd exacerbation and respiratory failure. Intubated 12/26 and extubated 12/27.  PMH - COPD with chronic hypoxemic respiratory failure, diastolic heart failure, htn, paroxysmal fibrillation, ckd, morbid obesity   Clinical Impression   Pt typically functions independently. Presents with decreased activity tolerance, but is functioning modified independently. Began educating pt in energy conservation strategies including seated showering, agreeable to shower seat. Titrated 02 to 5L with pt able to maintain Sp02 at 94%. Likely to progress well and not require post acute OT.      Recommendations for follow up therapy are one component of a multi-disciplinary discharge planning process, led by the attending physician.  Recommendations may be updated based on patient status, additional functional criteria and insurance authorization.   Follow Up Recommendations  No OT follow up     Assistance Recommended at Discharge PRN  Patient can return home with the following Assistance with cooking/housework    Functional Status Assessment  Patient has had a recent decline in their functional status and demonstrates the ability to make significant improvements in function in a reasonable and predictable amount of time.  Equipment Recommendations  Tub/shower seat    Recommendations for Other Services       Precautions / Restrictions Restrictions Weight Bearing Restrictions: No      Mobility Bed Mobility Overal bed mobility: Modified Independent             General bed mobility comments: increased time and effort    Transfers Overall transfer level: Modified independent Equipment used: None                      Balance                                           ADL either performed  or assessed with clinical judgement   ADL Overall ADL's : Modified independent                                             Vision Baseline Vision/History: 1 Wears glasses Ability to See in Adequate Light: 0 Adequate Patient Visual Report: No change from baseline       Perception     Praxis      Pertinent Vitals/Pain Pain Assessment Pain Assessment: No/denies pain     Hand Dominance Right   Extremity/Trunk Assessment Upper Extremity Assessment Upper Extremity Assessment: Overall WFL for tasks assessed   Lower Extremity Assessment Lower Extremity Assessment: Defer to PT evaluation   Cervical / Trunk Assessment Cervical / Trunk Assessment: Other exceptions (obesity)   Communication Communication Communication: No difficulties   Cognition Arousal/Alertness: Awake/alert Behavior During Therapy: WFL for tasks assessed/performed Overall Cognitive Status: Within Functional Limits for tasks assessed                                 General Comments: hx of medical non compliance     General Comments  Pt on 10L HFNC and numerous extensions for pt to be able to self ambulate to bathroom when needed. on entry with  SpO2 98%O2, Able to remove extensions and decrease HFNC to 5L and pt able to maintain SpO2 > 90%O2. Switched to  5L O2 regular [place] with extensions to be able to ambulate to bathroom and pt able to maintain SpO2 92-94%O2.    Exercises     Shoulder Instructions      Home Living Family/patient expects to be discharged to:: Private residence Living Arrangements: Alone Available Help at Discharge: Family;Friend(s);Available PRN/intermittently Type of Home: House Home Access: Stairs to enter [employer] of Steps: 4+1 Entrance Stairs-Rails: None Home Layout: One level     Bathroom Shower/Tub: Chief Strategy Officer: Standard     Home Equipment: Grab bars - tub/shower;Hand held shower head   Additional  Comments: Pt on 2-3L O2 at home      Prior Functioning/Environment Prior Level of Function : Independent/Modified Independent             Mobility Comments: ambulates without AD, drives ADLs Comments: independent, stands to shower        OT Problem List: Decreased activity tolerance      OT Treatment/Interventions: DME and/or AE instruction;Energy conservation    OT Goals(Current goals can be found in the care plan section) Acute Rehab OT Goals OT Goal Formulation: With patient Time For Goal Achievement: 05/20/22 Potential to Achieve Goals: Good ADL Goals Additional ADL Goal #1: Pt will complete ADLs while maintaining Sp02> 90% on 3L 02. Additional ADL Goal #2: Pt will state at least 3 energy conservation strategies as instructed.  OT Frequency: Min 2X/week    Co-evaluation   Reason for Co-Treatment: Complexity of the patient's impairments (multi-system involvement)          AM-PAC OT "6 Clicks" Daily Activity     Outcome Measure Help from another person eating meals?: None Help from another person taking care of personal grooming?: None Help from another person toileting, which includes using toliet, bedpan, or urinal?: None Help from another person bathing (including washing, rinsing, drying)?: None Help from another person to put on and taking off regular upper body clothing?: None Help from another person to put on and taking off regular lower body clothing?: None 6 Click Score: 24   End of Session Equipment Utilized During Treatment: Oxygen  Activity Tolerance: Patient tolerated treatment well Patient left: in chair;with call bell/phone within reach  OT Visit Diagnosis: Other (comment) (decreased activity tolerance)                Time: 8309-4076 OT Time Calculation (min): 36 min Charges:  OT General Charges $OT Visit: 1 Visit OT Evaluation $OT Eval Low Complexity: 1 Low  Berna Spare, OTR/L Acute Rehabilitation Services Office: [phone]    Evern Bio 05/06/2022, 11:30 AM

## 2022-05-06 NOTE — Assessment & Plan Note (Signed)
Cr stable relative to baseline 1.3-1.5

## 2022-05-06 NOTE — Assessment & Plan Note (Signed)
BMI 42 

## 2022-05-06 NOTE — Assessment & Plan Note (Signed)
Net negative 800cc yesterday, weight down 1kg. Cr and K stable -Continue Furosemide 80 mg IV twice a day  -Monitor K -Strict I/Os, daily weights, telemetry  -Daily monitoring renal function

## 2022-05-06 NOTE — Assessment & Plan Note (Signed)
-   CPAP at night °

## 2022-05-06 NOTE — Hospital Course (Addendum)
Zachary Hawkins is a 66 y.o. M with hx MO/OHS/OSA/COPD/sdCHF EF now recovered and chronic respiratory failure on 2-4L at home, CKD IIIa baseline 1.4-1.7, HTN, medication nonadherence and pAF on Eliquis who presented with being found naked, confused, and SOB.  EMS activated and found patient with SpO2 60% on 2L.    12/26: Intubated in ER 12/27: Extubated, transferred OOU  12/28: Still on 10L O2 12/29: Weaned to 5-6L

## 2022-05-06 NOTE — Assessment & Plan Note (Signed)
Due to Afib 

## 2022-05-07 DIAGNOSIS — J9622 Acute and chronic respiratory failure with hypercapnia: Secondary | ICD-10-CM | POA: Diagnosis not present

## 2022-05-07 DIAGNOSIS — J9621 Acute and chronic respiratory failure with hypoxia: Secondary | ICD-10-CM | POA: Diagnosis not present

## 2022-05-07 LAB — BASIC METABOLIC PANEL
Anion gap: 10 (ref 5–15)
BUN: 30 mg/dL — ABNORMAL HIGH (ref 8–23)
CO2: 37 mmol/L — ABNORMAL HIGH (ref 22–32)
Calcium: 7.8 mg/dL — ABNORMAL LOW (ref 8.9–10.3)
Chloride: 93 mmol/L — ABNORMAL LOW (ref 98–111)
Creatinine, Ser: 1.22 mg/dL (ref 0.61–1.24)
GFR, Estimated: >60 mL/min (ref >60)
Glucose, Bld: 142 mg/dL — ABNORMAL HIGH (ref 70–99)
Potassium: 4.6 mmol/L (ref 3.5–5.1)
Sodium: 140 mmol/L (ref 135–145)

## 2022-05-07 LAB — CBC
HCT: 45.8 % (ref 39.0–52.0)
Hemoglobin: 13.1 g/dL (ref 13.0–17.0)
MCH: 26.5 pg (ref 26.0–34.0)
MCHC: 28.6 g/dL — ABNORMAL LOW (ref 30.0–36.0)
MCV: 92.7 fL (ref 80.0–100.0)
Platelets: 229 10*3/uL (ref 150–400)
RBC: 4.94 MIL/uL (ref 4.22–5.81)
RDW: 17.8 % — ABNORMAL HIGH (ref 11.5–15.5)
WBC: 7.7 10*3/uL (ref 4.0–10.5)
nRBC: 1 % — ABNORMAL HIGH (ref 0.0–0.2)

## 2022-05-07 LAB — PHOSPHORUS: Phosphorus: 3.8 mg/dL (ref 2.5–4.6)

## 2022-05-07 LAB — MAGNESIUM: Magnesium: 2.2 mg/dL (ref 1.7–2.4)

## 2022-05-07 NOTE — Progress Notes (Signed)
Mobility Specialist Progress Note   05/07/22 1430  Mobility  Activity Ambulated with assistance in hallway  Level of Assistance Standby assist, set-up cues, supervision of patient - no hands on  Assistive Device Four wheel walker  Distance Ambulated (ft) 480 ft  Range of Motion/Exercises Active;All extremities  Activity Response Tolerated well   Patient received dangling EOB and agreeable to participate. Ambulated mod I with steady gait. Required 6LO2 to maintain an oxygen saturation >90%. Returned to room without complaint or incident. Was left dangling with all needs met, call bell in reach.   [place] Bence Trapp, BS EXP Mobility Specialist Please contact via SecureChat or Rehab office at [phone]

## 2022-05-07 NOTE — Plan of Care (Signed)
  Problem: Clinical Measurements: Goal: Respiratory complications will improve Outcome: Progressing   Problem: Activity: Goal: Risk for activity intolerance will decrease Outcome: Progressing   

## 2022-05-07 NOTE — Progress Notes (Signed)
  Progress Note   Patient: Zachary Hawkins PIR:[id number] DOB: Feb 24, 1956 DOA: 05/04/2022     3 DOS: the patient was seen and examined on 05/07/2022        Brief hospital course: [name] is a 66 y.o. M with hx MO/OHS/OSA/COPD/sdCHF EF now recovered and chronic respiratory failure on 2-4L at home, CKD IIIa baseline 1.4-1.7, HTN, medication nonadherence and pAF on Eliquis who presented with being found naked, confused, and SOB.  EMS activated and found patient with SpO2 60% on 2L.    12/26: Intubated in ER 12/27: Extubated, transferred OOU  12/28: Still on 10L O2 12/29: Weaned to 5-6L     Assessment and Plan: * Acute on chronic respiratory failure with hypoxia and hypercapnia (HCC) due to heart failure flare, COPD flare, and obesity hypoventilation syndrome    Chronic obstructive pulmonary disease with acute exacerbation (HCC) - Continue Solu-Medrol - Continue ICS/LABA/LAMA/LAMA and PRN SABA - Continue pepcid    Acute on chronic diastolic (congestive) heart failure (HCC) Cr imrpoving with diruesis -Continue Furosemide 80 mg IV twice a day  -Monitor K -Strict I/Os, daily weights, telemetry  -Daily monitoring renal function    Stage 3a chronic kidney disease (CKD) (HCC) Cr stable relative to baseline 1.3-1.5  Acute metabolic encephalopathy Due to respiratoryt failure, now resolved.  Class 3 obesity (HCC) BMI 42  Hyperlipidemia - Continue atorvastatin  Secondary hypercoagulable state (HCC) Due to Afib  Paroxysmal atrial fibrillation (HCC) HR controlled - Continue apixaban - Continue BB  OSA (obstructive sleep apnea) - CPAP at night  Essential hypertension BP stable - Continue bisoprolol, Lasix - Resume spironolactone          Subjective: Patient still having trouble breathing patient, short of breath, still mildly swollen.  No chest pain, palpitation, confusion.     Physical Exam: BP (!) 146/76 (BP Location: Left Arm)   Pulse 89   Temp  97.8 F (36.6 C) (Oral)   Resp 18   Ht 6\' 2"  (1.88 m)   Wt (!) 150.2 kg   SpO2 97%   BMI 42.51 kg/m   Obese adult male, lying in bed RRR, no murmurs, mild peripheral edema, JVD noted Respiratory rate seems increased with exertion, lung sounds diminished, no wheezing appreciated Abdomen soft without tenderness palpation or guarding Attention normal, affect normal, judgment insight appear normal, moves all extremities with normal strength and coordination, speech fluent, face symmetric      Data Reviewed: Glucose normal Basic metabolic panel stable, creatinine 1.5, no change from yesterday Comprehensive blood counts no change from yesterday, unremarkable  Family Communication: None present    Disposition: Status is: Inpatient          Author: , MD 05/07/2022 7:10 PM  For on call review [email or web address].

## 2022-05-08 DIAGNOSIS — J9621 Acute and chronic respiratory failure with hypoxia: Secondary | ICD-10-CM | POA: Diagnosis not present

## 2022-05-08 DIAGNOSIS — J9622 Acute and chronic respiratory failure with hypercapnia: Secondary | ICD-10-CM | POA: Diagnosis not present

## 2022-05-08 LAB — BASIC METABOLIC PANEL
Anion gap: 9 (ref 5–15)
BUN: 29 mg/dL — ABNORMAL HIGH (ref 8–23)
CO2: 38 mmol/L — ABNORMAL HIGH (ref 22–32)
Calcium: 8.3 mg/dL — ABNORMAL LOW (ref 8.9–10.3)
Chloride: 92 mmol/L — ABNORMAL LOW (ref 98–111)
Creatinine, Ser: 1.14 mg/dL (ref 0.61–1.24)
GFR, Estimated: >60 mL/min (ref >60)
Glucose, Bld: 115 mg/dL — ABNORMAL HIGH (ref 70–99)
Potassium: 4.2 mmol/L (ref 3.5–5.1)
Sodium: 139 mmol/L (ref 135–145)

## 2022-05-08 MED ORDER — FUROSEMIDE 40 MG PO TABS
80.0000 mg | ORAL_TABLET | Freq: Once | ORAL | Status: AC
  Administered 2022-05-08: 80 mg via ORAL
  Filled 2022-05-08: qty 2

## 2022-05-08 NOTE — Discharge Summary (Signed)
Physician Discharge Summary   Patient: Zachary Hawkins MRN: [id number] DOB: [birth year]  Admit date:     05/04/2022  Discharge date: 05/08/22  Discharge Physician: Zachary Hawkins   PCP: Zachary Hiss, PA-C     Recommendations at discharge:  Follow up with Cardiology Dr. Aundra Hawkins for CHF flare Follow up with Pulmonology, Dr. Verlee Hawkins for sleep apnea and chronic respiratory failure Dr. Verlee Hawkins:  Patient's medical equipment agency should be contacting you for a portable O2 concentrator for him     Discharge Diagnoses: Principal Problem:   Acute on chronic respiratory failure with hypoxia and hypercapnia (HCC) Active Problems:   Chronic obstructive pulmonary disease with acute exacerbation (HCC)   Acute on chronic diastolic (congestive) heart failure ([place])   Essential hypertension   OSA (obstructive sleep apnea)   Paroxysmal atrial fibrillation (HCC)   Secondary hypercoagulable state ([place])   Hyperlipidemia   Class 3 obesity ([place])   Acute metabolic encephalopathy   Stage 3a chronic kidney disease (CKD) [facility])      Hospital Course: Zachary Hawkins is a 66 y.o. M with hx MO/OHS/OSA/COPD/sdCHF EF now recovered and chronic respiratory failure on 2-4L at home, CKD IIIa baseline 1.4-1.7, HTN, medication nonadherence and pAF on Eliquis who presented with being found naked, confused, and SOB.  EMS activated and found patient with SpO2 60% on 2L.       * Acute on chronic respiratory failure with hypoxia and hypercapnia (HCC) Due to heart failure flare, COPD flare, and obesity hypoventilation syndrome  Intubated in ER, started on steroids, diuretics; extubated next day, transferred OOU      Chronic obstructive pulmonary disease with acute exacerbation (HCC) - Continue Solu-Medrol - Continue ICS/LABA/LAMA/LAMA and PRN SABA - Continue pepcid    Acute on chronic diastolic (congestive) heart failure (HCC) Diuresed 7L to clinical euvolemia, able to ambualte 300 ft on home  O2 without symptoms.    Discharged with Cardiology follow up.    Stage 3a chronic kidney disease (CKD) (HCC)  Acute metabolic encephalopathy Due to respiratory failure, now resolved.  Class 3 obesity (HCC) BMI 42  Hyperlipidemia On atorvastatin  Paroxysmal atrial fibrillation (HCC) On apixaban  OSA (obstructive sleep apnea) On CPAP  Essential hypertension On spiro, bisoprolol, Lasix            [facility] Controlled Substances Registry was reviewed for this patient prior to discharge.  Consultants: Critical Care   Disposition: Home Diet recommendation:  Discharge Diet Orders (From admission, onward)     Start     Ordered   05/08/22 0000  Diet - low sodium heart healthy        05/08/22 1112             DISCHARGE MEDICATION: Allergies as of 05/08/2022       Reactions   Ace Inhibitors Anaphylaxis   Black Cherry Fruit Hawkins Zachary Hawkins] Anaphylaxis   Tongue   Grenadine Flavor [flavoring Agent] Anaphylaxis        Medication List     TAKE these medications    Advair HFA 115-21 MCG/ACT inhaler Generic drug: fluticasone-salmeterol INHALE 2 PUFFS INTO THE LUNGS 2 (TWO) TIMES DAILY.   albuterol 108 (90 Base) MCG/ACT inhaler Commonly known as: VENTOLIN HFA Inhale 2 puffs into the lungs every 4 (four) hours as needed for wheezing or shortness of breath.   apixaban 5 MG Tabs tablet Commonly known as: ELIQUIS Take 1 tablet (5 mg total) by mouth 2 (two) times daily.   atorvastatin  10 MG tablet Commonly known as: LIPITOR Take 1 tablet (10 mg total) by mouth daily.   bisoprolol 5 MG tablet Commonly known as: ZEBETA Take 0.5 tablets (2.5 mg total) by mouth daily.   budesonide 0.25 MG/2ML nebulizer solution Commonly known as: PULMICORT Use 1 vial (2 mLs) by nebulization 2 times daily.   dapagliflozin propanediol 10 MG Tabs tablet Commonly known as: FARXIGA Take 1 tablet (10 mg total) by mouth daily.   EPINEPHrine 0.3 mg/0.3  mL Soaj injection Commonly known as: EPI-PEN Inject 0.3 mg into the muscle as needed for anaphylaxis.   famotidine 20 MG tablet Commonly known as: PEPCID Take 1 tablet (20 mg total) by mouth 2 (two) times daily.   fluticasone 50 MCG/ACT nasal spray Commonly known as: FLONASE PLACE 1 SPRAY INTO BOTH NOSTRILS DAILY.   mupirocin ointment 2 % Commonly known as: [place] 1 application. into the nose 2 (two) times daily. What changed:  when to take this reasons to take this   pantoprazole 40 MG tablet Commonly known as: PROTONIX Take 1 tablet (40 mg total) by mouth daily.   polyethylene glycol 17 g packet Commonly known as: MIRALAX / GLYCOLAX Take 17 g by mouth 2 (two) times daily. What changed: additional instructions   saline Gel Place 1 application into both nostrils at bedtime. What changed:  when to take this reasons to take this   spironolactone 25 MG tablet Commonly known as: ALDACTONE Take 1 tablet (25 mg total) by mouth daily.   tamsulosin 0.4 MG Caps capsule Commonly known as: FLOMAX Take 1 capsule (0.4 mg total) by mouth daily after supper.   torsemide 20 MG tablet Commonly known as: DEMADEX Patient takes 4 tablets by mouth daily.               Durable Medical Equipment  (From admission, onward)           Start     Ordered   05/06/22 1203  For home use only DME Other see comment  Once       Comments: POC evaluation for portable concentrator  Question:  Length of Need  Answer:  6 Months   05/06/22 1202            Follow-up Information     [facility] Schedule an appointment as soon as possible for a visit in 1 week(s).   Specialty: Rehabilitation Contact information: [address] [id number] mc [place] [id number] Zachary Hawkins, Zachary Hawkins. Schedule an appointment as soon as possible for a visit in 1 week(s).   Specialty: Physician  Assistant Contact information: [place] [address] [phone]                 Discharge Instructions     Ambulatory referral to Physical Therapy   Complete by: As directed    Diet - low sodium heart healthy   Complete by: As directed    Discharge instructions   Complete by: As directed    **IMPORTANT DISCHARGE INSTRUCTIONS**   From Dr. Loleta Books: You were admitted for congestive heart failure and COPD flaring up  For the congestive heart failure, you were treated with IV Lasix  You should weigh yourself daily  Resume your home torsemide (take the first dose this afternoon)  Call Dr. Claris Gladden office [facility] to make an appoitnment.  I will  also contact them to try to schedule you  Have your kidney function checked in 1 week   Increase activity slowly   Complete by: As directed        Discharge Exam: Filed Weights   05/06/22 0421 05/07/22 0510 05/08/22 0026  Weight: (!) 149.9 kg (!) 150.2 kg (!) 150.2 kg    General: Pt is alert, awake, not in acute distress Cardiovascular: RRR, nl S1-S2, no murmurs appreciated.   No LE edema.   Respiratory: Normal respiratory rate and rhythm.  CTAB without rales or wheezes. Abdominal: Abdomen soft and non-tender.  No distension or HSM.   Neuro/Psych: Strength symmetric in upper and lower extremities.  Judgment and insight appear normal.   Condition at discharge: good  The results of significant diagnostics from this hospitalization (including imaging, microbiology, ancillary and laboratory) are listed below for reference.   Imaging Studies: DG Chest Port 1 View  Result Date: 05/05/2022 CLINICAL DATA:  Respiratory failure EXAM: PORTABLE CHEST 1 VIEW COMPARISON:  05/04/2022 FINDINGS: Interval placement of enteric tube which courses below the diaphragm with distal tip beyond the inferior margin of the film. Endotracheal tube remains appropriately  position within the midthoracic trachea. Stable cardiomegaly. Pulmonary vascular congestion. Low lung volumes with increasing bibasilar airspace opacities. Probable small right pleural effusion. No pneumothorax. IMPRESSION: Low lung volumes with increasing bibasilar airspace opacities. Electronically Signed   By: Davina Poke D.O.   On: 05/05/2022 08:08   CT Head Wo Contrast  Result Date: 05/04/2022 CLINICAL DATA:  Altered mental status EXAM: CT HEAD WITHOUT CONTRAST TECHNIQUE: Contiguous axial images were obtained from the base of the skull through the vertex without intravenous contrast. RADIATION DOSE REDUCTION: This exam was performed according to the departmental dose-optimization program which includes automated exposure control, adjustment of the mA and/or kV according to patient size and/or use of iterative reconstruction technique. COMPARISON:  08/14/2021 FINDINGS: Brain: Normal anatomic configuration. Parenchymal volume loss is commensurate with the patient's age. Moderate periventricular white matter changes are present which appear progressive since prior examination. This may reflect the sequela o progressive f small vessel ischemic change, though demyelinating disease, drug/toxin exposure, metabolic deficiency, infection, and radiation therapy could appear similarly. Remote lacunar infarct within the right caudate body again noted. No abnormal intra or extra-axial mass lesion or fluid collection. No abnormal mass effect or midline shift. No evidence of acute intracranial hemorrhage or infarct. Ventricular size is normal. Cerebellum unremarkable. Vascular: No asymmetric hyperdense vasculature at the skull base. Skull: Intact Sinuses/Orbits: There is dense opacification of the frontal, ethmoidal, sphenoid, and right maxillary sinuses, similar to prior examination. No definite superimposed osseous erosion. Mild mucosal thickening within the aerated left maxillary sinus. The orbits are  unremarkable. Other: Mastoid air cells and middle ear cavities are clear. IMPRESSION: 1. No acute intracranial hemorrhage or infarct. 2. Moderate periventricular white matter changes, progressive since prior examination. This may reflect the sequela of small vessel ischemic change, though demyelinating disease, drug/toxin exposure, infection, and radiation therapy could appear similarly. 3. Stable, extensive paranasal sinus disease. Electronically Signed   By: Fidela Salisbury M.D.   On: 05/04/2022 17:38   DG Abdomen 1 View  Result Date: 05/04/2022 CLINICAL DATA:  Nasogastric tube placement EXAM: ABDOMEN - 1 VIEW COMPARISON:  Portable exam 1355 hours compared to 10/09/2021 FINDINGS: Nasogastric tube traverses stomach with tip projecting over distal gastric antrum. Patient rotated to the RIGHT. Nonobstructive bowel gas pattern. Subsegmental atelectasis LEFT lung base. IMPRESSION: Tip of nasogastric tube projects over distal  gastric antrum. Electronically Signed   By: Ulyses Southward M.D.   On: 05/04/2022 14:13   DG Chest Portable 1 View  Result Date: 05/04/2022 CLINICAL DATA:  ETT placement EXAM: PORTABLE CHEST 1 VIEW COMPARISON:  Oct 06, 2021 KUB FINDINGS: An ET tube is been placed in the interval, terminating in the mid trachea, in good position. Stable cardiomegaly. The hila and mediastinum are unchanged. No pneumothorax. Bibasilar opacities demonstrate streaky platelike character. No nodules or masses. No other acute abnormalities. IMPRESSION: 1. The ET tube terminates in the mid trachea, in good position. 2. Platelike and streaky opacities in the bases favored represent atelectasis rather than pneumonia or aspiration. Electronically Signed   By: Gerome Sam III M.D.   On: 05/04/2022 13:36    Microbiology: Results for orders placed or performed during the hospital encounter of 05/04/22  Resp panel by RT-PCR (RSV, Flu A&Hawkins, Covid) Anterior Nasal Swab     Status: None   Collection Time: 05/04/22 12:55  PM   Specimen: Anterior Nasal Swab  Result Value Ref Range Status   SARS Coronavirus 2 by RT PCR NEGATIVE NEGATIVE Final    Comment: (NOTE) SARS-CoV-2 target nucleic acids are NOT DETECTED.  The SARS-CoV-2 RNA is generally detectable in upper respiratory specimens during the acute phase of infection. The lowest concentration of SARS-CoV-2 viral copies this assay can detect is 138 copies/mL. A negative result does not preclude SARS-Cov-2 infection and should not be used as the sole basis for treatment or other patient management decisions. A negative result may occur with  improper specimen collection/handling, submission of specimen other than nasopharyngeal swab, presence of viral mutation(s) within the areas targeted by this assay, and inadequate number of viral copies(<138 copies/mL). A negative result must be combined with clinical observations, patient history, and epidemiological information. The expected result is Negative.  Fact Sheet for Patients:  BloggerCourse.com  Fact Sheet for Healthcare Providers:  SeriousBroker.it  This test is no t yet approved or cleared by the [place] FDA and  has been authorized for detection and/or diagnosis of SARS-CoV-2 by FDA under an Emergency Use Authorization (EUA). This EUA will remain  in effect (meaning this test can be used) for the duration of the COVID-19 declaration under Section 564(Hawkins)(1) of the Act, 21 U.S.C.section 360bbb-3(Hawkins)(1), unless the authorization is terminated  or revoked sooner.       Influenza A by PCR NEGATIVE NEGATIVE Final   Influenza Hawkins by PCR NEGATIVE NEGATIVE Final    Comment: (NOTE) The Xpert Xpress SARS-CoV-2/FLU/RSV plus assay is intended as an aid in the diagnosis of influenza from Nasopharyngeal swab specimens and should not be used as a sole basis for treatment. Nasal washings and aspirates are unacceptable for Xpert Xpress  SARS-CoV-2/FLU/RSV testing.  Fact Sheet for Patients: BloggerCourse.com  Fact Sheet for Healthcare Providers: SeriousBroker.it  This test is not yet approved or cleared by the [place] FDA and has been authorized for detection and/or diagnosis of SARS-CoV-2 by FDA under an Emergency Use Authorization (EUA). This EUA will remain in effect (meaning this test can be used) for the duration of the COVID-19 declaration under Section 564(Hawkins)(1) of the Act, 21 U.S.C. section 360bbb-3(Hawkins)(1), unless the authorization is terminated or revoked.     Resp Syncytial Virus by PCR NEGATIVE NEGATIVE Final    Comment: (NOTE) Fact Sheet for Patients: BloggerCourse.com  Fact Sheet for Healthcare Providers: SeriousBroker.it  This test is not yet approved or cleared by the [place] and has been authorized  for detection and/or diagnosis of SARS-CoV-2 by FDA under an Emergency Use Authorization (EUA). This EUA will remain in effect (meaning this test can be used) for the duration of the COVID-19 declaration under Section 564(Hawkins)(1) of the Act, 21 U.S.C. section 360bbb-3(Hawkins)(1), unless the authorization is terminated or revoked.  Performed at [facility], [address], [place], [place] [address]   Respiratory (~20 pathogens) panel by PCR     Status: None   Collection Time: 05/04/22 12:55 PM   Specimen: Nasopharyngeal Swab; Respiratory  Result Value Ref Range Status   Adenovirus NOT DETECTED NOT DETECTED Final   Coronavirus 229E NOT DETECTED NOT DETECTED Final    Comment: (NOTE) The Coronavirus on the Respiratory Panel, DOES NOT test for the novel  Coronavirus (2019 nCoV)    Coronavirus HKU1 NOT DETECTED NOT DETECTED Final   Coronavirus NL63 NOT DETECTED NOT DETECTED Final   Coronavirus OC43 NOT DETECTED NOT DETECTED Final   Metapneumovirus NOT DETECTED NOT DETECTED Final    Rhinovirus / Enterovirus NOT DETECTED NOT DETECTED Final   Influenza A NOT DETECTED NOT DETECTED Final   Influenza Hawkins NOT DETECTED NOT DETECTED Final   Parainfluenza Virus 1 NOT DETECTED NOT DETECTED Final   Parainfluenza Virus 2 NOT DETECTED NOT DETECTED Final   Parainfluenza Virus 3 NOT DETECTED NOT DETECTED Final   Parainfluenza Virus 4 NOT DETECTED NOT DETECTED Final   Respiratory Syncytial Virus NOT DETECTED NOT DETECTED Final   Bordetella pertussis NOT DETECTED NOT DETECTED Final   Bordetella Parapertussis NOT DETECTED NOT DETECTED Final   Chlamydophila pneumoniae NOT DETECTED NOT DETECTED Final   Mycoplasma pneumoniae NOT DETECTED NOT DETECTED Final    Comment: Performed at [facility], [address] [place], [place] [address]  MRSA Next Gen by PCR, Nasal     Status: Abnormal   Collection Time: 05/04/22  5:33 PM   Specimen: Nasal Mucosa; Nasal Swab  Result Value Ref Range Status   MRSA by PCR Next Gen DETECTED (A) NOT DETECTED Final    Comment: RESULT CALLED TO, READ BACK BY AND VERIFIED WITH: RN Hawkins. [id number] @[phone]  FH (NOTE) The GeneXpert MRSA Assay (FDA approved for NASAL specimens only), is one component of a comprehensive MRSA colonization surveillance program. It is not intended to diagnose MRSA infection nor to guide or monitor treatment for MRSA infections. Test performance is not FDA approved in patients less than 64 years old. Performed at [facility], [address] [place], [place] [address]   Culture, Respiratory w Gram Stain     Status: None   Collection Time: 05/04/22  5:46 PM   Specimen: Tracheal Aspirate; Respiratory  Result Value Ref Range Status   Specimen Description TRACHEAL ASPIRATE  Final   Special Requests NONE  Final   Gram Stain   Final    ABUNDANT WBC PRESENT, PREDOMINANTLY PMN RARE GRAM POSITIVE COCCI IN PAIRS RARE GRAM POSITIVE COCCI IN CLUSTERS    Culture   Final    Normal respiratory flora-no Staph aureus or Pseudomonas  seen Performed at [facility], [address] [place], [place] [address]    Report Status 05/06/2022 FINAL  Final  Urine Culture     Status: None   Collection Time: 05/04/22  6:37 PM   Specimen: Urine, Clean Catch  Result Value Ref Range Status   Specimen Description URINE, CLEAN CATCH  Final   Special Requests NONE  Final   Culture   Final  NO GROWTH Performed at [facility], [place] [address], [place], [place] [address]    Report Status 05/05/2022 FINAL  Final    Labs: CBC: Recent Labs  Lab 05/04/22 1300 05/04/22 1647 05/05/22 0139 05/06/22 0640 05/07/22 0206  WBC 10.0  --  9.7 10.5 7.7  NEUTROABS 8.0*  --   --   --   --   HGB 13.2 15.6 13.9 13.7 13.1  HCT 49.2 46.0 49.6 48.3 45.8  MCV 96.9  --  91.2 93.6 92.7  PLT 252  --  258 246 [id number]   Basic Metabolic Panel: Recent Labs  Lab 05/04/22 1300 05/04/22 1647 05/04/22 2010 05/05/22 0139 05/05/22 1927 05/06/22 0640 05/07/22 0206 05/08/22 0014  NA 138   < > 138 139  --  142 140 139  K 5.8*   < > 5.4* 4.4  --  4.2 4.6 4.2  CL 95*  --  96* 93*  --  95* 93* 92*  CO2 36*  --  33* 32  --  39* 37* 38*  GLUCOSE 125*  --  133* 120*  --  113* 142* 115*  BUN 17  --  20 21  --  29* 30* 29*  CREATININE 1.25*  --  1.41* 1.53*  --  1.55* 1.22 1.14  CALCIUM 8.6*  --  9.3 9.4  --  8.0* 7.8* 8.3*  MG 2.3  --   --  2.2  --  2.3 2.2  --   PHOS  --   --   --  1.0* 7.4* 6.1* 3.8  --    < > = values in this interval not displayed.   Liver Function Tests: Recent Labs  Lab 05/04/22 1300  AST 17  ALT 15  ALKPHOS 63  BILITOT 0.4  PROT 7.3  ALBUMIN 3.5   CBG: Recent Labs  Lab 05/04/22 1938 05/04/22 2314 05/05/22 0316 05/05/22 0818 05/05/22 1145  GLUCAP 144* 168* 147* 118* 129*    Discharge time spent: approximately 35 minutes spent on discharge counseling, evaluation of patient on day of discharge, and coordination of discharge planning with nursing, social work, pharmacy and case  management  Signed: Edwin Dada, MD [facility] 05/08/2022

## 2022-05-14 ENCOUNTER — Ambulatory Visit: Payer: Medicaid Other

## 2022-05-19 ENCOUNTER — Ambulatory Visit: Payer: Medicaid Other | Attending: Family Medicine

## 2022-05-19 DIAGNOSIS — M6281 Muscle weakness (generalized): Secondary | ICD-10-CM | POA: Insufficient documentation

## 2022-05-19 DIAGNOSIS — M5459 Other low back pain: Secondary | ICD-10-CM | POA: Insufficient documentation

## 2022-05-19 DIAGNOSIS — R2681 Unsteadiness on feet: Secondary | ICD-10-CM | POA: Insufficient documentation

## 2022-05-19 DIAGNOSIS — R262 Difficulty in walking, not elsewhere classified: Secondary | ICD-10-CM | POA: Insufficient documentation

## 2022-05-19 DIAGNOSIS — R2689 Other abnormalities of gait and mobility: Secondary | ICD-10-CM | POA: Insufficient documentation

## 2022-05-19 NOTE — Therapy (Incomplete)
OUTPATIENT PHYSICAL THERAPY LOWER EXTREMITY EVALUATION   Patient Name: Zachary Hawkins MRN: [id number] DOB:May 29, 1955, 67 y.o., male Today's Date: 05/19/2022  END OF SESSION:   Past Medical History:  Diagnosis Date   Asthma    BPH (benign prostatic hyperplasia)    Cardiomyopathy ([place]) 06/2017   EF 40-45%   CHF (congestive heart failure) (HCC)    Chronic renal insufficiency, stage 3 (moderate) (HCC)    COPD (chronic obstructive pulmonary disease) (HCC)    Diastolic dysfunction 94/8546   grade 2    Hyperlipidemia    Hypertension    Hypertensive urgency    Obesity    Obesity    OSA (obstructive sleep apnea)    Paroxysmal atrial fibrillation ([place]) 2022   on Eliquis   No past surgical history on file. Patient Active Problem List   Diagnosis Date Noted   Stage 3a chronic kidney disease (CKD) ([place]) 05/06/2022   ARF (acute renal failure) ([place]) 10/07/2021   SIRS (systemic inflammatory response syndrome) ([place]) 27/07/5007   Acute metabolic encephalopathy    Chronic diastolic heart failure (HCC)    Chronic obstructive pulmonary disease with acute exacerbation (HCC)    Hyperkalemia    Class 3 obesity ([place]) 07/18/2021   Acute on chronic diastolic heart failure ([place]) 07/17/2021   BPH (benign prostatic hyperplasia)    Hyperlipidemia    Acute on chronic respiratory failure with hypoxia and hypercapnia (HCC) 06/23/2021   Chronic diastolic CHF (congestive heart failure) ([place]) 06/23/2021   Acute on chronic diastolic (congestive) heart failure ([place]) 04/30/2021   AKI (acute kidney injury) ([place]) 04/29/2021   Chronic respiratory failure with hypercapnia ([place]) 04/29/2021   Bilateral hydrocele 04/29/2021   Dyspnea on exertion 04/29/2021   Acute respiratory failure with hypoxia ([place]) 03/10/2021   Urticaria 01/15/2021   Chronic heart failure with preserved ejection fraction ([place])    Angioedema 01/04/2021   Influenza vaccine refused 06/26/2020   COVID-19 vaccine series completed 06/26/2020    Paroxysmal atrial fibrillation ([place]) 07/02/2019   Secondary hypercoagulable state ([place]) 07/02/2019   OSA (obstructive sleep apnea) 04/03/2019   History of angioedema due to ACE INHIBITORS 10/13/2017   Non compliance w medication regimen 07/13/2017   Essential hypertension 06/23/2017   Acute kidney injury superimposed on chronic kidney disease ([place])    Cardiomyopathy ([place]) 06/06/2017   Dyspnea 06/05/2017    PCP: Dorise Hiss, PA-C  REFERRING PROVIDER: Edwin Dada, MD  REFERRING DIAG: Hypercarbia and general weakness  THERAPY DIAG:  No diagnosis found.  Rationale for Evaluation and Treatment: Rehabilitation  ONSET DATE: ***  SUBJECTIVE:   SUBJECTIVE STATEMENT: ***  PERTINENT HISTORY: Discharge Diagnoses: Principal Problem:   Acute on chronic respiratory failure with hypoxia and hypercapnia (HCC) Active Problems:   Chronic obstructive pulmonary disease with acute exacerbation (HCC)   Acute on chronic diastolic (congestive) heart failure (HCC)   Essential hypertension   OSA (obstructive sleep apnea)   Paroxysmal atrial fibrillation (HCC)   Secondary hypercoagulable state ([place])   Hyperlipidemia   Class 3 obesity (HCC)   Acute metabolic encephalopathy   Stage 3a chronic kidney disease (CKD) (HCC) PAIN:  Are you having pain? {OPRCPAIN:27236}  PRECAUTIONS: {Therapy precautions:24002}  WEIGHT BEARING RESTRICTIONS: {Yes ***/No:24003}  FALLS:  Has patient fallen in last 6 months? {fallsyesno:27318}  LIVING ENVIRONMENT: Lives with: {[facility] lives with:25569::"lives with their family"} Lives in: {Lives in:25570} Stairs: {opstairs:27293} Has following equipment at home: {Assistive devices:23999}  OCCUPATION: ***  PLOF: {PLOF:24004}  PATIENT GOALS: ***  NEXT MD VISIT:  OBJECTIVE:   DIAGNOSTIC FINDINGS: ***  PATIENT SURVEYS:  {rehab surveys:24030}  COGNITION: Overall cognitive status:  {cognition:24006}     SENSATION: {sensation:27233}  EDEMA:  {edema:24020}  MUSCLE LENGTH: Hamstrings: Right *** deg; Left *** deg Thomas test: Right *** deg; Left *** deg  POSTURE: {posture:25561}  PALPATION: ***  LOWER EXTREMITY ROM:  {AROM/PROM:27142} ROM Right eval Left eval  Hip flexion    Hip extension    Hip abduction    Hip adduction    Hip internal rotation    Hip external rotation    Knee flexion    Knee extension    Ankle dorsiflexion    Ankle plantarflexion    Ankle inversion    Ankle eversion     (Blank rows = not tested)  LOWER EXTREMITY MMT:  MMT Right eval Left eval  Hip flexion    Hip extension    Hip abduction    Hip adduction    Hip internal rotation    Hip external rotation    Knee flexion    Knee extension    Ankle dorsiflexion    Ankle plantarflexion    Ankle inversion    Ankle eversion     (Blank rows = not tested)  LOWER EXTREMITY SPECIAL TESTS:  {LEspecialtests:26242}  FUNCTIONAL TESTS:  {Functional tests:24029}  GAIT: Distance walked: *** Assistive device utilized: {Assistive devices:23999} Level of assistance: {Levels of assistance:24026} Comments: ***   TODAY'S TREATMENT:                                                                                                                               [facility] Adult PT Treatment:                                                DATE: 05/19/22 Therapeutic Exercise: *** Manual Therapy: *** Neuromuscular re-ed: *** Therapeutic Activity: *** Modalities: *** Self Care: ***     PATIENT EDUCATION:  Education details: *** Person educated: {Person educated:25204} Education method: {Education Method:25205} Education comprehension: {Education Comprehension:25206}  HOME EXERCISE PROGRAM: ***  ASSESSMENT:  CLINICAL IMPRESSION: Patient is a 67 y.o. male who was seen today for physical therapy evaluation and treatment for Hypercarbia and general weakness.   OBJECTIVE  IMPAIRMENTS: {opptimpairments:25111}.   ACTIVITY LIMITATIONS: {activitylimitations:27494}  PARTICIPATION LIMITATIONS: {participationrestrictions:25113}  PERSONAL FACTORS: {Personal factors:25162} are also affecting patient's functional outcome.   REHAB POTENTIAL: {rehabpotential:25112}  CLINICAL DECISION MAKING: {clinical decision making:25114}  EVALUATION COMPLEXITY: {Evaluation complexity:25115}   GOALS: Goals reviewed with patient? {yes/no:20286}  SHORT TERM GOALS: Target date: *** *** Baseline: Goal status: {GOALSTATUS:25110}  2.  *** Baseline:  Goal status: {GOALSTATUS:25110}  3.  *** Baseline:  Goal status: {GOALSTATUS:25110}  4.  *** Baseline:  Goal status: {GOALSTATUS:25110}  5.  *** Baseline:  Goal status: {GOALSTATUS:25110}  6.  *** Baseline:  Goal status: {GOALSTATUS:25110}  LONG TERM GOALS: Target date: ***  *** Baseline:  Goal status: {GOALSTATUS:25110}  2.  *** Baseline:  Goal status: {GOALSTATUS:25110}  3.  *** Baseline:  Goal status: {GOALSTATUS:25110}  4.  *** Baseline:  Goal status: {GOALSTATUS:25110}  5.  *** Baseline:  Goal status: {GOALSTATUS:25110}  6.  *** Baseline:  Goal status: {GOALSTATUS:25110}   PLAN:  PT FREQUENCY: {rehab frequency:25116}  PT DURATION: {rehab duration:25117}  PLANNED INTERVENTIONS: {rehab planned interventions:25118::"Therapeutic exercises","Therapeutic activity","Neuromuscular re-education","Balance training","Gait training","Patient/Family education","Self Care","Joint mobilization"}  PLAN FOR NEXT SESSION: ***   Preslei Blakley, PT 05/19/2022, 6:18 AM

## 2022-05-24 NOTE — Progress Notes (Incomplete)
ADVANCED HF CLINIC NOTE  Primary Care: McVey, Gelene Mink, PA-C Primary Cardiologist: Dr. Sallyanne Kuster HF Cardiologist: Dr. Aundra Dubin  HPI: [name] is a 67 y.o. with a h/o HFpEF, OSA, CPAP, smoker, CKD Stage III, HTN, obesity,and PAF. Has had multiple ED/hospital visits over the last year.  Ongoing compliance issues.    Admitted 03/2022 with A/C hypoxic/hypercarbic respiratory failure and volume overload. Treated with antibiotics steroids, nebs and diuretics.    Admitted 04/29/22 with increased shortness of breath in the setting of HF exacerbation. Diuresed with IV lasix and transitioned to lasix 80 mg twice a day   Evaluated in the ED 05/2021 with COPD exacerbation. Treated with nebulizers and steroids.    Admitted 06/23/21 with A/C respiratory failure and heart exacerbation. Placed on Bipap with improvement. Diuresed with IV lasix and transitioned to torsemide 100 mg daily. D/C weigth 344 pounds. Discharged 06/26/21 .   Seen in [facility] 3/23, volume up, likely in setting of high salt intake. Instructed to take extra torsemide 50 mg daily.   Admitted 07/17/21 with a/c CHF, diuresed with IV lasix. Discharged home, weight 338 lbs.  Post hospital follow up 3/23, NYHA III, volume OK on exam.   Admitted 4/23 with acute on chronic respiratory failure 2/2 CPAP noncompliance and unintentional pain medication overdose. Failed NIV and required intubation. CHF compensated.  Admitted 5/23 with SOB and AKI. GDMT held. Weight down to 325 lbs at discharge, new SCr baseline 1.5. Amlodipine, torsemide and spiro stopped at discharge. Saw general cardiology at follow up and torsemide restarted at 60 mg daily and spiro at 25 mg daily.  Today he returns for HF follow up. Overall feeling fine. Had a virtual visit with Urgent Care last week for SOB, and was instructed to increase torsemide 80 mg daily. He is having hand tremors now and attributes this to increased diuretic dose. He is generally SOB with minimal  activity but says breathing is better.  Can walk around his house without much dyspnea, but gets fatigued. Denies, CP, dizziness, edema, or PND. Chronically sleeps reclined.  Appetite ok. No fever or chills. Weight at home 338-340 pounds. Taking all medications. Now on Ozempic Wears CPAP occasionally. Wears 2L oxygen. Drinks a lot of juice during the day.   ECG (personally reviewed): none ordered today.  Labs (6/23): K 4.7, creatinine 1.35 Labs (7/23): BNP 19.5, K 4.6, creatinine 1.58 Labs (9/23): LDL 52, HDL 47, TGs 213 Labs (10/23): K 4.5, creatinine 1.66  PMH: 1. CKD stage 3 2. OSA: Uses CPAP.  3. HTN 4. Atrial fibrillation: Paroxysmal.  5. COPD: Prior smoker, quit in early 2023.  - Uses 2 L home oxygen.  6. Erectile dysfunction 7. Chronic diastolic CHF: Echo (66/44) with EF 60-65%, normal RV, normal IVC.  8. Hyperlipidemia  ROS: All systems reviewed and negative except as per HPI.   Current Outpatient Medications  Medication Sig Dispense Refill   ADVAIR HFA 115-21 MCG/ACT inhaler INHALE 2 PUFFS INTO THE LUNGS 2 (TWO) TIMES DAILY. 36 g 12   albuterol (VENTOLIN HFA) 108 (90 Base) MCG/ACT inhaler Inhale 2 puffs into the lungs every 4 (four) hours as needed for wheezing or shortness of breath. 18 g 0   apixaban (ELIQUIS) 5 MG TABS tablet Take 1 tablet (5 mg total) by mouth 2 (two) times daily. 60 tablet 11   atorvastatin (LIPITOR) 10 MG tablet Take 1 tablet (10 mg total) by mouth daily. 90 tablet 3   bisoprolol (ZEBETA) 5 MG tablet Take 0.5 tablets (2.5  mg total) by mouth daily. 45 tablet 3   budesonide (PULMICORT) 0.25 MG/2ML nebulizer solution Use 1 vial (2 mLs) by nebulization 2 times daily. 60 mL 12   dapagliflozin propanediol (FARXIGA) 10 MG TABS tablet Take 1 tablet (10 mg total) by mouth daily. 30 tablet 11   EPINEPHrine 0.3 mg/0.3 mL IJ SOAJ injection Inject 0.3 mg into the muscle as needed for anaphylaxis. 1 each 1   famotidine (PEPCID) 20 MG tablet Take 1 tablet (20 mg  total) by mouth 2 (two) times daily. 28 tablet 0   fluticasone (FLONASE) 50 MCG/ACT nasal spray PLACE 1 SPRAY INTO BOTH NOSTRILS DAILY. 16 g 11   mupirocin ointment (BACTROBAN) 2 % Place 1 application. into the nose 2 (two) times daily. (Patient taking differently: Place 1 application  into the nose daily as needed.) 22 g 0   pantoprazole (PROTONIX) 40 MG tablet Take 1 tablet (40 mg total) by mouth daily. 30 tablet 0   polyethylene glycol (MIRALAX / GLYCOLAX) 17 g packet Take 17 g by mouth 2 (two) times daily. (Patient taking differently: Take 17 g by mouth 2 (two) times daily. As needed) 14 each 0   saline (AYR) GEL Place 1 application into both nostrils at bedtime. (Patient taking differently: Place 1 application  into both nostrils at bedtime as needed (for congestion).) 14.1 g 11   spironolactone (ALDACTONE) 25 MG tablet Take 1 tablet (25 mg total) by mouth daily. 90 tablet 3   tamsulosin (FLOMAX) 0.4 MG CAPS capsule Take 1 capsule (0.4 mg total) by mouth daily after supper. 30 capsule 2   torsemide (DEMADEX) 20 MG tablet Patient takes 4 tablets by mouth daily.     No current facility-administered medications for this visit.   Allergies  Allergen Reactions   Ace Inhibitors Anaphylaxis   Black Cherry Fruit Extract [name]] Anaphylaxis    Tongue   Grenadine Flavor [Flavoring Agent] Anaphylaxis   Social History   Socioeconomic History   Marital status: Single    Spouse name: Not on file   Number of children: Not on file   Years of education: Not on file   Highest education level: Not on file  Occupational History   Not on file  Tobacco Use   Smoking status: Former    Packs/day: 1.00    Years: 36.00    Total pack years: 36.00    Types: Cigarettes    Quit date: 01/08/2021    Years since quitting: 1.3   Smokeless tobacco: Never  Vaping Use   Vaping Use: Never used  Substance and Sexual Activity   Alcohol use: Yes    Alcohol/week: 1.0 standard drink of alcohol    Types:  1 Shots of liquor per week    Comment: socially   Drug use: Yes    Types: Marijuana    Comment: every other week   Sexual activity: Not on file  Other Topics Concern   Not on file  Social History Narrative   Not on file   Social Determinants of Health   Financial Resource Strain: High Risk (07/15/2021)   Overall Financial Resource Strain (CARDIA)    Difficulty of Paying Living Expenses: Very hard  Food Insecurity: No Food Insecurity (07/15/2021)   Hunger Vital Sign    Worried About Running Out of Food in the Last Year: Never true    Ran Out of Food in the Last Year: Never true  Transportation Needs: No Transportation Needs (07/15/2021)   PRAPARE - Transportation  Lack of Transportation (Medical): No    Lack of Transportation (Non-Medical): No  Physical Activity: Not on file  Stress: Not on file  Social Connections: Not on file  Intimate Partner Violence: Not on file   Family History  Problem Relation Age of Onset   Diabetes Mother    Diabetes Father    There were no vitals taken for this visit.  Wt Readings from Last 3 Encounters:  05/08/22 (!) 150.2 kg (331 lb 1.6 oz)  04/05/22 (!) 155.7 kg (343 lb 3.2 oz)  02/02/22 (!) 157.9 kg (348 lb 3.2 oz)   PHYSICAL EXAM: General:  NAD. No resp difficulty, arrived in [facility] HEENT: Normal Neck: Supple. Thick neck, No JVD. Carotids 2+ bilat; no bruits. No lymphadenopathy or thryomegaly appreciated. Cor: PMI nondisplaced. Regular rate & rhythm. No rubs, gallops or murmurs. Lungs: Clear Abdomen: Soft, nontender, nondistended. No hepatosplenomegaly. No bruits or masses. Good bowel sounds. Extremities: No cyanosis, clubbing, rash, edema Neuro: Alert & oriented x 3, cranial nerves grossly intact. Moves all 4 extremities w/o difficulty. Affect pleasant. Slight bilateral hand tremor at rest.  ASSESSMENT & PLAN: 1. Chronic diastolic heart failure: Echo in 12/22 with EF 60-65%, RV normal. Multiple recent admissions.  On exam, he does not look  volume overloaded, his weight has trended down.  NYHA II-early III, functional class difficult due to body habitus and COPD. - Continue torsemide 80 mg daily + 40 KCL daily.  BMET/BNP today. - Continue bisoprolol 2.5 mg daily. - Continue Farxiga 10 mg daily. - Continue spironolactone 25 mg daily. - Angioedema with ACEI so not a candidate for ARNI.  - Discussed low salt food choices and limiting fluid intake to < 2 liter per day.  - ? Cardiomems, but he has Medicaid => he would be interested, may be able to get this when he goes on Medicare.  2. Atrial fibrillation: Paroxysmal.  Regular on exam today. - Continue bisoprolol.  - Continue Eliquis 5 mg bid. No bleeding issues. 3. OSA: Reinforced compliance w/ Bipap.  4. COPD: Prior smoker.  Uses home oxygen.  5. Obesity: There is no height or weight on file to calculate BMI. - He is now on Ozempic 0.25 mg daily, just started. 6. CKD Stage IIIa: Baseline around 1.3-1.6.  - Continue SGLT2i. No GU symptoms - BMET today. 7. Hyperlipidemia: Good lipids 9/23. 8. Hand tremors: ? essential vs physiologic tremor. Consider referral to Neurology. - Check CMET and TSH.   He is at high risk for readmits due to poor insight and ongoing dietary compliance issues.   Follow up in 3 months with Dr. Aundra Dubin.  Maricela [facility] FNP-BC 05/24/2022

## 2022-05-26 ENCOUNTER — Inpatient Hospital Stay (HOSPITAL_COMMUNITY): Payer: Medicaid Other

## 2022-05-26 NOTE — Progress Notes (Incomplete)
ADVANCED HF CLINIC NOTE  Primary Care: McVey, Madelaine Bhat, PA-C Primary Cardiologist: Dr. Royann Shivers HF Cardiologist: Dr. Shirlee Latch  HPI: [name] is a 67 y.o. with a h/o HFpEF, OSA, CPAP, smoker, CKD Stage III, HTN, obesity,and PAF. Has had multiple ED/hospital visits over the last year.  Ongoing compliance issues.    Admitted 03/2022 with A/C hypoxic/hypercarbic respiratory failure and volume overload. Treated with antibiotics steroids, nebs and diuretics.    Admitted 04/29/22 with increased shortness of breath in the setting of HF exacerbation. Diuresed with IV lasix and transitioned to lasix 80 mg twice a day   Evaluated in the ED 05/2021 with COPD exacerbation. Treated with nebulizers and steroids.    Admitted 06/23/21 with A/C respiratory failure and heart exacerbation. Placed on Bipap with improvement. Diuresed with IV lasix and transitioned to torsemide 100 mg daily. D/C weigth 344 pounds. Discharged 06/26/21 .   Seen in [facility] 3/23, volume up, likely in setting of high salt intake. Instructed to take extra torsemide 50 mg daily.   Admitted 07/17/21 with a/c CHF, diuresed with IV lasix. Discharged home, weight 338 lbs.  Post hospital follow up 3/23, NYHA III, volume OK on exam.   Admitted 4/23 with acute on chronic respiratory failure 2/2 CPAP noncompliance and unintentional pain medication overdose. Failed NIV and required intubation. CHF compensated.  Admitted 5/23 with SOB and AKI. GDMT held. Weight down to 325 lbs at discharge, new SCr baseline 1.5. Amlodipine, torsemide and spiro stopped at discharge. Saw general cardiology at follow up and torsemide restarted at 60 mg daily and spiro at 25 mg daily.  Today he returns for post hospital follow up.  Denies SOB/PND/Orthopnea. Appetite ok. No fever or chills. Weight at home  pounds. Taking all medications   Labs (6/23): K 4.7, creatinine 1.35 Labs (7/23): BNP 19.5, K 4.6, creatinine 1.58 Labs (9/23): LDL 52, HDL 47, TGs  213 Labs (10/23): K 4.5, creatinine 1.66  PMH: 1. CKD stage 3 2. OSA: Uses CPAP.  3. HTN 4. Atrial fibrillation: Paroxysmal.  5. COPD: Prior smoker, quit in early 2023.  - Uses 2 L home oxygen.  6. Erectile dysfunction 7. Chronic diastolic CHF: Echo (12/22) with EF 60-65%, normal RV, normal IVC.  8. Hyperlipidemia  ROS: All systems reviewed and negative except as per HPI.   Current Outpatient Medications  Medication Sig Dispense Refill   ADVAIR HFA 115-21 MCG/ACT inhaler INHALE 2 PUFFS INTO THE LUNGS 2 (TWO) TIMES DAILY. 36 g 12   albuterol (VENTOLIN HFA) 108 (90 Base) MCG/ACT inhaler Inhale 2 puffs into the lungs every 4 (four) hours as needed for wheezing or shortness of breath. 18 g 0   apixaban (ELIQUIS) 5 MG TABS tablet Take 1 tablet (5 mg total) by mouth 2 (two) times daily. 60 tablet 11   atorvastatin (LIPITOR) 10 MG tablet Take 1 tablet (10 mg total) by mouth daily. 90 tablet 3   bisoprolol (ZEBETA) 5 MG tablet Take 0.5 tablets (2.5 mg total) by mouth daily. 45 tablet 3   budesonide (PULMICORT) 0.25 MG/2ML nebulizer solution Use 1 vial (2 mLs) by nebulization 2 times daily. 60 mL 12   dapagliflozin propanediol (FARXIGA) 10 MG TABS tablet Take 1 tablet (10 mg total) by mouth daily. 30 tablet 11   EPINEPHrine 0.3 mg/0.3 mL IJ SOAJ injection Inject 0.3 mg into the muscle as needed for anaphylaxis. 1 each 1   famotidine (PEPCID) 20 MG tablet Take 1 tablet (20 mg total) by mouth 2 (two) times  daily. 28 tablet 0   fluticasone (FLONASE) 50 MCG/ACT nasal spray PLACE 1 SPRAY INTO BOTH NOSTRILS DAILY. 16 g 11   mupirocin ointment (BACTROBAN) 2 % Place 1 application. into the nose 2 (two) times daily. (Patient taking differently: Place 1 application  into the nose daily as needed.) 22 g 0   pantoprazole (PROTONIX) 40 MG tablet Take 1 tablet (40 mg total) by mouth daily. 30 tablet 0   polyethylene glycol (MIRALAX / GLYCOLAX) 17 g packet Take 17 g by mouth 2 (two) times daily. (Patient  taking differently: Take 17 g by mouth 2 (two) times daily. As needed) 14 each 0   saline (AYR) GEL Place 1 application into both nostrils at bedtime. (Patient taking differently: Place 1 application  into both nostrils at bedtime as needed (for congestion).) 14.1 g 11   spironolactone (ALDACTONE) 25 MG tablet Take 1 tablet (25 mg total) by mouth daily. 90 tablet 3   tamsulosin (FLOMAX) 0.4 MG CAPS capsule Take 1 capsule (0.4 mg total) by mouth daily after supper. 30 capsule 2   torsemide (DEMADEX) 20 MG tablet Patient takes 4 tablets by mouth daily.     No current facility-administered medications for this visit.   Allergies  Allergen Reactions   Ace Inhibitors Anaphylaxis   Black Cherry Fruit Extract Marcelline Mates Extract] Anaphylaxis    Tongue   Grenadine Flavor [Flavoring Agent] Anaphylaxis   Social History   Socioeconomic History   Marital status: Single    Spouse name: Not on file   Number of children: Not on file   Years of education: Not on file   Highest education level: Not on file  Occupational History   Not on file  Tobacco Use   Smoking status: Former    Packs/day: 1.00    Years: 36.00    Total pack years: 36.00    Types: Cigarettes    Quit date: 01/08/2021    Years since quitting: 1.3   Smokeless tobacco: Never  Vaping Use   Vaping Use: Never used  Substance and Sexual Activity   Alcohol use: Yes    Alcohol/week: 1.0 standard drink of alcohol    Types: 1 Shots of liquor per week    Comment: socially   Drug use: Yes    Types: Marijuana    Comment: every other week   Sexual activity: Not on file  Other Topics Concern   Not on file  Social History Narrative   Not on file   Social Determinants of Health   Financial Resource Strain: High Risk (07/15/2021)   Overall Financial Resource Strain (CARDIA)    Difficulty of Paying Living Expenses: Very hard  Food Insecurity: No Food Insecurity (07/15/2021)   Hunger Vital Sign    Worried About Running Out of Food in the  Last Year: Never true    Ran Out of Food in the Last Year: Never true  Transportation Needs: No Transportation Needs (07/15/2021)   PRAPARE - Hydrologist (Medical): No    Lack of Transportation (Non-Medical): No  Physical Activity: Not on file  Stress: Not on file  Social Connections: Not on file  Intimate Partner Violence: Not on file   Family History  Problem Relation Age of Onset   Diabetes Mother    Diabetes Father    There were no vitals taken for this visit.  Wt Readings from Last 3 Encounters:  05/08/22 (!) 150.2 kg (331 lb 1.6 oz)  04/05/22 (!) 155.7  kg (343 lb 3.2 oz)  02/02/22 (!) 157.9 kg (348 lb 3.2 oz)   PHYSICAL EXAM: General:  NAD. No resp difficulty, arrived in [facility] HEENT: Normal Neck: Supple. Thick neck, No JVD. Carotids 2+ bilat; no bruits. No lymphadenopathy or thryomegaly appreciated. Cor: PMI nondisplaced. Regular rate & rhythm. No rubs, gallops or murmurs. Lungs: Clear Abdomen: Soft, nontender, nondistended. No hepatosplenomegaly. No bruits or masses. Good bowel sounds. Extremities: No cyanosis, clubbing, rash, edema Neuro: Alert & oriented x 3, cranial nerves grossly intact. Moves all 4 extremities w/o difficulty. Affect pleasant. Slight bilateral hand tremor at rest.  ASSESSMENT & PLAN: 1. Chronic diastolic heart failure: Echo in 12/22 with EF 60-65%, RV normal. Multiple recent admissions.  On exam, he does not look volume overloaded, his weight has trended down.  NYHA II-early III, functional class difficult due to body habitus and COPD. - Continue torsemide 80 mg daily + 40 KCL daily.  BMET/BNP today. - Continue bisoprolol 2.5 mg daily. - Continue Farxiga 10 mg daily. - Continue spironolactone 25 mg daily. - Angioedema with ACEI so not a candidate for ARNI.  - Discussed low salt food choices and limiting fluid intake to < 2 liter per day.  - ? Cardiomems, but he has Medicaid => he would be interested, may be able to get this  when he goes on Medicare.  2. Atrial fibrillation: Paroxysmal.  Regular on exam today. - Continue bisoprolol.  - Continue Eliquis 5 mg bid. No bleeding issues. 3. OSA: Reinforced compliance w/ Bipap.  4. COPD: Prior smoker.  Uses home oxygen.  5. Obesity: There is no height or weight on file to calculate BMI. - He is now on Ozempic 0.25 mg daily, just started. 6. CKD Stage IIIa: Baseline around 1.3-1.6.  - Continue SGLT2i. No GU symptoms - BMET today. 7. Hyperlipidemia: Good lipids 9/23. 8. Hand tremors: ? essential vs physiologic tremor. Consider referral to Neurology. - Check CMET and TSH.   He is at high risk for readmits due to poor insight and ongoing dietary compliance issues.   Follow up in 3 months with Dr. Aundra Dubin.  Poseidon Pam FNP-BC 05/26/2022

## 2022-05-27 ENCOUNTER — Other Ambulatory Visit: Payer: Self-pay

## 2022-05-27 ENCOUNTER — Ambulatory Visit: Payer: Medicaid Other

## 2022-05-27 ENCOUNTER — Ambulatory Visit (INDEPENDENT_AMBULATORY_CARE_PROVIDER_SITE_OTHER): Payer: Medicaid Other

## 2022-05-27 ENCOUNTER — Ambulatory Visit: Payer: Medicaid Other | Admitting: Adult Health

## 2022-05-27 ENCOUNTER — Encounter: Payer: Self-pay | Admitting: Adult Health

## 2022-05-27 VITALS — BP 100/70 | HR 71 | Temp 98.5°F | Ht 73.0 in | Wt 343.0 lb

## 2022-05-27 DIAGNOSIS — M5459 Other low back pain: Secondary | ICD-10-CM

## 2022-05-27 DIAGNOSIS — R2689 Other abnormalities of gait and mobility: Secondary | ICD-10-CM

## 2022-05-27 DIAGNOSIS — J9621 Acute and chronic respiratory failure with hypoxia: Secondary | ICD-10-CM

## 2022-05-27 DIAGNOSIS — N1831 Chronic kidney disease, stage 3a: Secondary | ICD-10-CM

## 2022-05-27 DIAGNOSIS — M6281 Muscle weakness (generalized): Secondary | ICD-10-CM

## 2022-05-27 DIAGNOSIS — R2681 Unsteadiness on feet: Secondary | ICD-10-CM | POA: Diagnosis present

## 2022-05-27 DIAGNOSIS — J9622 Acute and chronic respiratory failure with hypercapnia: Secondary | ICD-10-CM

## 2022-05-27 DIAGNOSIS — G4733 Obstructive sleep apnea (adult) (pediatric): Secondary | ICD-10-CM

## 2022-05-27 DIAGNOSIS — R262 Difficulty in walking, not elsewhere classified: Secondary | ICD-10-CM | POA: Diagnosis not present

## 2022-05-27 DIAGNOSIS — J449 Chronic obstructive pulmonary disease, unspecified: Secondary | ICD-10-CM

## 2022-05-27 DIAGNOSIS — I5032 Chronic diastolic (congestive) heart failure: Secondary | ICD-10-CM | POA: Diagnosis not present

## 2022-05-27 MED ORDER — ALBUTEROL SULFATE (2.5 MG/3ML) 0.083% IN NEBU: 2.5000 mg | INHALATION_SOLUTION | Freq: Four times a day (QID) | RESPIRATORY_TRACT | 5 refills | Status: AC | PRN

## 2022-05-27 MED ORDER — SPIRIVA RESPIMAT 2.5 MCG/ACT IN AERS: 2.0000 | INHALATION_SPRAY | Freq: Every day | RESPIRATORY_TRACT | 5 refills | Status: DC

## 2022-05-27 NOTE — Therapy (Signed)
OUTPATIENT PHYSICAL THERAPY LOWER EXTREMITY EVALUATION   Patient Name: Zachary Hawkins MRN: [id number] DOB:July 30, 1955, 67 y.o., male Today's Date: 05/27/2022  END OF SESSION:  PT End of Session - 05/27/22 1439     Visit Number 1    Number of Visits 17    Date for PT Re-Evaluation 07/22/22    Authorization Type [employer]    PT Start Time 1345    PT Stop Time 1425    PT Time Calculation (min) 40 min    Activity Tolerance Patient tolerated treatment well    Behavior During Therapy WFL for tasks assessed/performed             Past Medical History:  Diagnosis Date   Asthma    BPH (benign prostatic hyperplasia)    Cardiomyopathy ([place]) 06/2017   EF 40-45%   CHF (congestive heart failure) (HCC)    Chronic renal insufficiency, stage 3 (moderate) (HCC)    COPD (chronic obstructive pulmonary disease) ([place])    Diastolic dysfunction 49/7026   grade 2    Hyperlipidemia    Hypertension    Hypertensive urgency    Obesity    Obesity    OSA (obstructive sleep apnea)    Paroxysmal atrial fibrillation ([place]) 2022   on Eliquis   History reviewed. No pertinent surgical history. Patient Active Problem List   Diagnosis Date Noted   Stage 3a chronic kidney disease (CKD) ([place]) 05/06/2022   ARF (acute renal failure) ([place]) 10/07/2021   SIRS (systemic inflammatory response syndrome) ([place]) 37/85/8850   Acute metabolic encephalopathy    Chronic diastolic heart failure (HCC)    Chronic obstructive pulmonary disease with acute exacerbation (HCC)    Hyperkalemia    Class 3 obesity ([place]) 07/18/2021   Acute on chronic diastolic heart failure ([place]) 07/17/2021   BPH (benign prostatic hyperplasia)    Hyperlipidemia    Acute on chronic respiratory failure with hypoxia and hypercapnia (HCC) 06/23/2021   Chronic diastolic CHF (congestive heart failure) ([place]) 06/23/2021   Acute on chronic diastolic (congestive) heart failure ([place]) 04/30/2021   AKI (acute kidney injury) ([place]) 04/29/2021    Chronic respiratory failure with hypercapnia ([place]) 04/29/2021   Bilateral hydrocele 04/29/2021   Dyspnea on exertion 04/29/2021   Acute respiratory failure with hypoxia ([place]) 03/10/2021   Urticaria 01/15/2021   Chronic heart failure with preserved ejection fraction (HCC)    Angioedema 01/04/2021   Influenza vaccine refused 06/26/2020   COVID-19 vaccine series completed 06/26/2020   Paroxysmal atrial fibrillation ([place]) 07/02/2019   Secondary hypercoagulable state ([place]) 07/02/2019   OSA (obstructive sleep apnea) 04/03/2019   History of angioedema due to ACE INHIBITORS 10/13/2017   Non compliance w medication regimen 07/13/2017   Essential hypertension 06/23/2017   Acute kidney injury superimposed on chronic kidney disease ([place])    Cardiomyopathy ([place]) 06/06/2017   Dyspnea 06/05/2017    PCP: Dorise Hiss, PA-C  REFERRING PROVIDER: Edwin Dada, MD  REFERRING DIAG:  R06.89 (ICD-10-CM) - Hypercarbia R53.1 (ICD-10-CM) - Generalized weakness  THERAPY DIAG:  Muscle weakness (generalized) - Plan: PT plan of care cert/re-cert  Difficulty in walking, not elsewhere classified - Plan: PT plan of care cert/re-cert  Other abnormalities of gait and mobility - Plan: PT plan of care cert/re-cert  Unsteadiness on feet - Plan: PT plan of care cert/re-cert  Other low back pain - Plan: PT plan of care cert/re-cert  Rationale for Evaluation and Treatment: Rehabilitation  ONSET DATE: Chronic  SUBJECTIVE:   SUBJECTIVE STATEMENT:  Pt presents to PT and is well known to therapist. Recent hospitalization stay for COPD exacerbation and hypoxemia. Pt notes difficulty breathing with prolonged activities. He also has LBP, especially in morning, that limits his comfort with cooking and other home ADLs Pt notes that he has had a lot of weakness in LE as well as instability, especially with longer distance ambulation and stairs. He has fallen recently after getting his feet tangled up  in his O2 line.   PERTINENT HISTORY: COPD with chronic hypoxemic respiratory failure, diastolic heart failure, HTN, paroxysmal fibrillation, CKD, morbid obesity  PAIN:  Are you having pain?  Yes: NPRS scale: 6/10 Worst: 10/10 at worst Pain location: lower back Pain description: sharp, tight Aggravating factors: prolonged sitting, lying  Relieving factors: medication  PRECAUTIONS: Fall and Other: O2  WEIGHT BEARING RESTRICTIONS: No  FALLS:  Has patient fallen in last 6 months? Yes. Number of falls: one; tripped over O2 cord at home   LIVING ENVIRONMENT: Lives with: lives with their family Lives in: House/apartment Stairs: Yes: External: 4 steps; none Has following equipment at home: Single point cane and supplemental oxygen  OCCUPATION: Not working  PLOF: Independent  PATIENT GOALS: improve functional activity tolerance and decrease back pain for getting safer with getting around his home  OBJECTIVE:   DIAGNOSTIC FINDINGS: N/A  PATIENT SURVEYS:  FOTO: 34% function; 44% predicted  COGNITION: Overall cognitive status: Within functional limits for tasks assessed     SENSATION: WFL  POSTURE: rounded shoulders, forward head, and larger body habitus  PALPATION: TTP to lumbar paraspinals   LOWER EXTREMITY MMT:  MMT Right eval Left eval  Hip flexion    Hip extension    Hip abduction    Hip adduction    Hip internal rotation    Hip external rotation    Knee flexion    Knee extension    Ankle dorsiflexion    Ankle plantarflexion    Ankle inversion    Ankle eversion     (Blank rows = not tested)  FUNCTIONAL TESTS:  30 Second Sit to Stand: 5 reps 2MWT: 17ft  GAIT: Distance walked: 27ft Assistive device utilized: None - pulling O2 tanke Level of assistance: SBA Comments: decreased gait speed, wide BoS  TREATMENT: [facility] Adult PT Treatment:                                                DATE: 05/27/2022 Therapeutic Exercise: STS x 5 Seated clamshell x  20 Black TB  PATIENT EDUCATION:  Education details: eval findings, FOTO ,HEP, POC Person educated: Patient Education method: Explanation, Demonstration, and Handouts Education comprehension: verbalized understanding and returned demonstration  HOME EXERCISE PROGRAM: Access Code: [email or web address] URL: [email or web address] Date: 05/27/2022 Prepared by: Octavio Manns  Exercises - Sit to Stand with Arms Crossed  - 1 x daily - 7 x weekly - 3 sets - 10 reps - Seated Hip Abduction with Resistance  - 1 x daily - 7 x weekly - 3 sets - 20 reps - black band hold  ASSESSMENT:  CLINICAL IMPRESSION: Patient is a 67 y.o. M who was seen today for physical therapy evaluation and treatment for chronic deconditioning, gait instability, and general muscle weakness.  Physical findings are consistent with referring provider impression and hospital course, as pt demonstrates functional hip weakness and gait difficulties assessed via functional outcome measures. His  FOTO score shows sharp decline in subjective functional ability in comparison to PLOF. He would benefit from skilled PT services working on improving strength and functional activity tolerance.  OBJECTIVE IMPAIRMENTS: decreased activity tolerance, decreased balance, decreased endurance, decreased mobility, difficulty walking, decreased ROM, decreased strength, and pain.   ACTIVITY LIMITATIONS: carrying, lifting, sitting, standing, squatting, sleeping, stairs, transfers, bed mobility, bathing, self feeding, and reach over head  PARTICIPATION LIMITATIONS: driving, shopping, community activity, occupation, and yard work  PERSONAL FACTORS: Time since onset of injury/illness/exacerbation and 3+ comorbidities: COPD with chronic hypoxemic respiratory failure, diastolic heart failure, HTN, paroxysmal fibrillation, CKD, morbid obesity   are also affecting patient's functional outcome.   REHAB POTENTIAL: Fair to Guarded secondary to complex PMH and  current physical condition   CLINICAL DECISION MAKING: Evolving/moderate complexity  EVALUATION COMPLEXITY: Moderate   GOALS: Goals reviewed with patient? No  SHORT TERM GOALS: Target date: 06/17/2022   Pt will be compliant and knowledgeable with initial HEP for improved comfort and carryover Baseline: initial HEP given  Goal status: INITIAL  2.  Pt will self report low back pain no greater than 6/10 for improved comfort and functional ability Baseline: 10/10 at worst Goal status: INITIAL   LONG TERM GOALS: Target date: 07/22/2022  Pt will improve FOTO function score to no less than 44% as proxy for functional improvement Baseline: 34% function Goal status: INITIAL   2.  Pt will increase 30 Second Sit to Stand rep count to no less than 8 reps for improved balance, strength, and functional mobility Baseline: 5 reps  Goal status: INITIAL   3.  Pt will improve distance to no less than 267ft for improved safety and endurance with home and community navigation Baseline: 179ft Goal status: INITIAL  4.  Pt will be able to stand greater than 15 minutes without increase in LBP in order to more comfortably Phagan breakfast and other home ADLS Baseline: unable Goal status: INITIAL  PLAN:  PT FREQUENCY: 2x/week  PT DURATION: 8 weeks  PLANNED INTERVENTIONS: Therapeutic exercises, Therapeutic activity, Neuromuscular re-education, Balance training, Gait training, Patient/Family education, Self Care, Joint mobilization, Aquatic Therapy, Dry Needling, Electrical stimulation, Cryotherapy, Moist heat, Manual therapy, and Re-evaluation  PLAN FOR NEXT SESSION: assess HEP response, general strengthening, improving standing and functional activity tolerance   Check all possible CPT codes: 84132 - PT Re-evaluation, 97110- Therapeutic Exercise, [phone]- Neuro Re-education, [phone] - Gait Training, [phone] - Manual Therapy, 97530 - Therapeutic Activities, [phone] - Self Care, and [phone] - Aquatic  therapy    Check all conditions that are expected to impact treatment: Morbid obesity and Respiratory disorders   If treatment provided at initial evaluation, no treatment charged due to lack of authorization.       Eloy End, PT 05/27/2022, 3:38 PM

## 2022-05-27 NOTE — Addendum Note (Signed)
Addended by: Vanessa Barbara on: 05/27/2022 04:37 PM   Modules accepted: Orders

## 2022-05-27 NOTE — Assessment & Plan Note (Signed)
Discussed healthy weight loss ?

## 2022-05-27 NOTE — Assessment & Plan Note (Signed)
Continue follow-up with PCP and ongoing monitoring

## 2022-05-27 NOTE — Patient Instructions (Addendum)
Begin Spiriva 2 puffs daily  Continue on Advair Twice daily   Continue on Oxygen 2l/m - wear at all times.  Restart Trilogy At bedtime and naps  Work on weight loss.  Albuterol inhaler or neb As needed   Activity as tolerated  Continue with Rehab/PT  Continue follow up with Cardiology  Chest xray today.  Follow up with Dr. Verlee Monte in 3-4 weeks and As needed   Please contact office for sooner follow up if symptoms do not improve or worsen or seek emergency care

## 2022-05-27 NOTE — Assessment & Plan Note (Signed)
Recent hospitalization with acute on chronic hypoxic and hypercarbic respiratory failure requiring vent support. Patient had multiple similar hospitalizations-needs to restart trilogy device at bedtime.  And with naps.  Also encouraged on oxygen compliance  Plan  Patient Instructions  Begin Spiriva 2 puffs daily  Continue on Advair Twice daily   Continue on Oxygen 2l/m - wear at all times.  Restart Trilogy At bedtime and naps  Work on weight loss.  Albuterol inhaler or neb As needed   Activity as tolerated  Continue with Rehab/PT  Continue follow up with Cardiology  Chest xray today.  Follow up with Dr. Verlee Monte in 3-4 weeks and As needed   Please contact office for sooner follow up if symptoms do not improve or worsen or seek emergency care

## 2022-05-27 NOTE — Progress Notes (Signed)
States he received a flu vaccine in the fall and also in the hospital.  Oxygen turned off, sats down to 84% on RA at rest.  Oxygen placed back on patient at 2L.

## 2022-05-27 NOTE — Assessment & Plan Note (Signed)
Untreated sleep apnea and OHS.  Needs to restart noninvasive support at bedtime.. If unable to get Trilogy would recommend a titration study.

## 2022-05-27 NOTE — Assessment & Plan Note (Signed)
Prone to recurrent decompensation.  Continue to take diuretics and follow-up with cardiology as planned Oxygen compliance encouraged.

## 2022-05-27 NOTE — Progress Notes (Signed)
@Patient  ID: Zachary Hawkins, male    DOB: [birth year], 67 y.o.   MRN: [id number]  Chief Complaint  Patient presents with   Hospitalization Follow-up    Referring provider: Desiree Lucy*  HPI: 67 year old male former smoker (36 PK hx )  followed for COPD, OSA/OHS and hypoxic and hypercarbic respiratory failure Medical history significant for congestive heart failure, atrial fibrillation, angioedema, chronic kidney disease  TEST/EVENTS :  Hospitalizations   December 2023 acute on chronic hypercarbic and hypoxic respiratory failure and decompensated heart failure June 2023 acute on chronic hypercarbic and hypoxic respiratory failure, acute kidney injury 08/2021 acute COPD exacerbation, hypoxic and hypercarbic respiratory failure and decompensated heart failure March 2023 acute hypoxic and hypercarbic respiratory failure, decompensated heart failure, acute on chronic kidney disease February 2023 acute on chronic hypoxic and hypercarbic respiratory failure, decompensated congestive heart failure November and December 2022 acute on chronic hypoxic and hypercarbic respiratory failure and decompensated heart failure  2D echo April 30, 2021 EF 60 to 65%  PFTs December 23, 2021 unable to complete  NPSG July 13, 2017 moderate sleep apnea with AHI 23.9/hour and SpO2 low at 86%.     05/27/2022 Follow up : COPD, sleep apnea/OHS , chronic respiratory failure with hypercarbia and hypoxemia, diastolic heart failure, post hospital follow-up Patient returns for a posthospital follow-up.  Patient was readmitted again last month with acute respiratory failure requiring vent support.  Patient is prone to recurrent hospitalizations as above.  Typically gets admitted with acute on chronic hypoxic and hypercarbic respiratory failure, COPD exacerbation and decompensated congestive heart failure.  Has multiple comorbidities including chronic kidney disease.  Patient has been recommended to use  noninvasive vent at bedtime for underlying sleep apnea, OHS and chronic hypercarbia.  Unfortunately patient did have a trilogy device but was noncompliant and no longer has the device as it was taken back from the DME and insurance company. Since discharge patient is feeling better.  He is going to physical therapy/rehab.  Patient says he is wearing his oxygen for the most part.  He is currently on 2 L.  Patient wants to come off of oxygen.  Today in the office O2 saturations dropped to 84% at rest off of oxygen.  Requires 2 L to maintain O2 saturations greater than 88 to 90%.  We had a long discussion regarding hypoxemia and the potential complications. Says overall his lower extremity swelling has been doing okay.  He remains on Demadex and Aldactone. Patient endorses compliance with Advair inhaler twice daily.  Does not have any albuterol inhaler or nebulizer medications.  He denies any increased cough or congestion.  No fever.  Flu shot and COVID booster up-to-date.  [employer]. Has nurse that comes to home and checks on him.   Allergies  Allergen Reactions   Ace Inhibitors Anaphylaxis   Black Cherry Fruit Extract Marcelline Mates Extract] Anaphylaxis    Tongue   Grenadine Flavor [Flavoring Agent] Anaphylaxis    Immunization History  Administered Date(s) Administered   PFIZER(Purple Top)SARS-COV-2 Vaccination 11/12/2019, 12/05/2019, 03/18/2020   Pfizer Covid-19 Vaccine Bivalent Booster 30yrs & up 03/16/2021   Td 04/22/2011   Tdap 11/26/2020   Zoster Recombinat (Shingrix) 11/26/2020    Past Medical History:  Diagnosis Date   Asthma    BPH (benign prostatic hyperplasia)    Cardiomyopathy ([place]) 06/2017   EF 40-45%   CHF (congestive heart failure) (HCC)    Chronic renal insufficiency, stage 3 (moderate) (HCC)    COPD (chronic obstructive  pulmonary disease) (HCC)    Diastolic dysfunction 06/2017   grade 2    Hyperlipidemia    Hypertension    Hypertensive urgency    Obesity     Obesity    OSA (obstructive sleep apnea)    Paroxysmal atrial fibrillation (HCC) 2022   on Eliquis    Tobacco History: Social History   Tobacco Use  Smoking Status Former   Packs/day: 1.00   Years: 36.00   Total pack years: 36.00   Types: Cigarettes   Quit date: 01/08/2021   Years since quitting: 1.3  Smokeless Tobacco Never   Counseling given: Not Answered   Outpatient Medications Prior to Visit  Medication Sig Dispense Refill   albuterol (VENTOLIN HFA) 108 (90 Base) MCG/ACT inhaler Inhale 2 puffs into the lungs every 4 (four) hours as needed for wheezing or shortness of breath. 18 g 0   apixaban (ELIQUIS) 5 MG TABS tablet Take 1 tablet (5 mg total) by mouth 2 (two) times daily. 60 tablet 11   atorvastatin (LIPITOR) 10 MG tablet Take 1 tablet (10 mg total) by mouth daily. 90 tablet 3   bisoprolol (ZEBETA) 5 MG tablet Take 0.5 tablets (2.5 mg total) by mouth daily. 45 tablet 3   dapagliflozin propanediol (FARXIGA) 10 MG TABS tablet Take 1 tablet (10 mg total) by mouth daily. 30 tablet 11   famotidine (PEPCID) 20 MG tablet Take 1 tablet (20 mg total) by mouth 2 (two) times daily. 28 tablet 0   fluticasone (FLONASE) 50 MCG/ACT nasal spray PLACE 1 SPRAY INTO BOTH NOSTRILS DAILY. 16 g 11   fluticasone-salmeterol (ADVAIR HFA) 115-21 MCG/ACT inhaler Inhale 2 puffs into the lungs 2 (two) times daily.     mupirocin ointment (BACTROBAN) 2 % Place 1 application. into the nose 2 (two) times daily. (Patient taking differently: Place 1 application  into the nose daily as needed.) 22 g 0   pantoprazole (PROTONIX) 40 MG tablet Take 1 tablet (40 mg total) by mouth daily. 30 tablet 0   polyethylene glycol (MIRALAX / GLYCOLAX) 17 g packet Take 17 g by mouth 2 (two) times daily. (Patient taking differently: Take 17 g by mouth daily as needed. As needed) 14 each 0   saline (AYR) GEL Place 1 application into both nostrils at bedtime. (Patient taking differently: Place 1 application  into both nostrils at  bedtime as needed (for congestion).) 14.1 g 11   spironolactone (ALDACTONE) 25 MG tablet Take 1 tablet (25 mg total) by mouth daily. 90 tablet 3   tamsulosin (FLOMAX) 0.4 MG CAPS capsule Take 1 capsule (0.4 mg total) by mouth daily after supper. 30 capsule 2   torsemide (DEMADEX) 20 MG tablet Patient takes 4 tablets by mouth daily.     EPINEPHrine 0.3 mg/0.3 mL IJ SOAJ injection Inject 0.3 mg into the muscle as needed for anaphylaxis. (Patient not taking: Reported on 05/27/2022) 1 each 1   ADVAIR HFA 115-21 MCG/ACT inhaler INHALE 2 PUFFS INTO THE LUNGS 2 (TWO) TIMES DAILY. (Patient not taking: Reported on 05/27/2022) 36 g 12   budesonide (PULMICORT) 0.25 MG/2ML nebulizer solution Use 1 vial (2 mLs) by nebulization 2 times daily. (Patient not taking: Reported on 05/27/2022) 60 mL 12   No facility-administered medications prior to visit.     Review of Systems:   Constitutional:   No  weight loss, night sweats,  Fevers, chills,  +fatigue, or  lassitude.  HEENT:   No headaches,  Difficulty swallowing,  Tooth/dental problems, or  Sore  throat,                No sneezing, itching, ear ache, nasal congestion, post nasal drip,   CV:  No chest pain,  Orthopnea, PND, anasarca, dizziness, palpitations, syncope.   GI  No heartburn, indigestion, abdominal pain, nausea, vomiting, diarrhea, change in bowel habits, loss of appetite, bloody stools.   Resp:   No chest wall deformity  Skin: no rash or lesions.  GU: no dysuria, change in color of urine, no urgency or frequency.  No flank pain, no hematuria   MS:  No joint pain or swelling.  No decreased range of motion.  No back pain.    Physical Exam  BP 100/70 (BP Location: Left Arm, Patient Position: Sitting, Cuff Size: Large)   Pulse 71   Temp 98.5 F (36.9 C) (Oral)   Ht 6\' 1"  (1.854 m)   Wt (!) 343 lb (155.6 kg)   SpO2 93%   BMI 45.25 kg/m   GEN: A/Ox3; pleasant , NAD, BMI 45, chronically ill-appearing, on oxygen and wheelchair   HEENT:   [place]/AT,  NOSE-clear, THROAT-clear, no lesions, no postnasal drip or exudate noted.   NECK:  Supple w/ fair ROM; no JVD; normal carotid impulses w/o bruits; no thyromegaly or nodules palpated; no lymphadenopathy.    RESP  Clear  P & A; w/o, wheezes/ rales/ or rhonchi. no accessory muscle use, no dullness to percussion  CARD:  RRR, no m/r/g, tr  peripheral edema, pulses intact, no cyanosis or clubbing.  GI:   Soft & nt; nml bowel sounds; no organomegaly or masses detected.   Musco: Warm bil, no deformities or joint swelling noted.   Neuro: alert, no focal deficits noted.    Skin: Warm, no lesions or rashes    Lab Results:  CBC    Component Value Date/Time   WBC 7.7 05/07/2022 0206   RBC 4.94 05/07/2022 0206   HGB 13.1 05/07/2022 0206   HGB 16.7 07/11/2020 1103   HCT 45.8 05/07/2022 0206   HCT 50.3 07/11/2020 1103   PLT 229 05/07/2022 0206   PLT 232 07/11/2020 1103   MCV 92.7 05/07/2022 0206   MCV 88 07/11/2020 1103   MCH 26.5 05/07/2022 0206   MCHC 28.6 (L) 05/07/2022 0206   RDW 17.8 (H) 05/07/2022 0206   RDW 13.6 07/11/2020 1103   LYMPHSABS 0.6 (L) 05/04/2022 1300   MONOABS 0.9 05/04/2022 1300   EOSABS 0.3 05/04/2022 1300   BASOSABS 0.0 05/04/2022 1300    BMET    Component Value Date/Time   NA 139 05/08/2022 0014   NA 146 (H) 10/15/2021 1225   K 4.2 05/08/2022 0014   CL 92 (L) 05/08/2022 0014   CO2 38 (H) 05/08/2022 0014   GLUCOSE 115 (H) 05/08/2022 0014   BUN 29 (H) 05/08/2022 0014   BUN 16 10/15/2021 1225   CREATININE 1.14 05/08/2022 0014   CALCIUM 8.3 (L) 05/08/2022 0014   GFRNONAA >60 05/08/2022 0014   GFRAA 56 (L) 08/02/2019 0718    BNP    Component Value Date/Time   BNP 120.1 (H) 05/04/2022 1300    ProBNP No results found for: "PROBNP"  Imaging: DG Chest Port 1 View  Result Date: 05/05/2022 CLINICAL DATA:  Respiratory failure EXAM: PORTABLE CHEST 1 VIEW COMPARISON:  05/04/2022 FINDINGS: Interval placement of enteric tube which courses  below the diaphragm with distal tip beyond the inferior margin of the film. Endotracheal tube remains appropriately position within the midthoracic trachea. Stable cardiomegaly.  Pulmonary vascular congestion. Low lung volumes with increasing bibasilar airspace opacities. Probable small right pleural effusion. No pneumothorax. IMPRESSION: Low lung volumes with increasing bibasilar airspace opacities. Electronically Signed   By: Duanne Guess D.O.   On: 05/05/2022 08:08   CT Head Wo Contrast  Result Date: 05/04/2022 CLINICAL DATA:  Altered mental status EXAM: CT HEAD WITHOUT CONTRAST TECHNIQUE: Contiguous axial images were obtained from the base of the skull through the vertex without intravenous contrast. RADIATION DOSE REDUCTION: This exam was performed according to the departmental dose-optimization program which includes automated exposure control, adjustment of the mA and/or kV according to patient size and/or use of iterative reconstruction technique. COMPARISON:  08/14/2021 FINDINGS: Brain: Normal anatomic configuration. Parenchymal volume loss is commensurate with the patient's age. Moderate periventricular white matter changes are present which appear progressive since prior examination. This may reflect the sequela o progressive f small vessel ischemic change, though demyelinating disease, drug/toxin exposure, metabolic deficiency, infection, and radiation therapy could appear similarly. Remote lacunar infarct within the right caudate body again noted. No abnormal intra or extra-axial mass lesion or fluid collection. No abnormal mass effect or midline shift. No evidence of acute intracranial hemorrhage or infarct. Ventricular size is normal. Cerebellum unremarkable. Vascular: No asymmetric hyperdense vasculature at the skull base. Skull: Intact Sinuses/Orbits: There is dense opacification of the frontal, ethmoidal, sphenoid, and right maxillary sinuses, similar to prior examination. No definite  superimposed osseous erosion. Mild mucosal thickening within the aerated left maxillary sinus. The orbits are unremarkable. Other: Mastoid air cells and middle ear cavities are clear. IMPRESSION: 1. No acute intracranial hemorrhage or infarct. 2. Moderate periventricular white matter changes, progressive since prior examination. This may reflect the sequela of small vessel ischemic change, though demyelinating disease, drug/toxin exposure, infection, and radiation therapy could appear similarly. 3. Stable, extensive paranasal sinus disease. Electronically Signed   By: Helyn Numbers M.D.   On: 05/04/2022 17:38   DG Abdomen 1 View  Result Date: 05/04/2022 CLINICAL DATA:  Nasogastric tube placement EXAM: ABDOMEN - 1 VIEW COMPARISON:  Portable exam 1355 hours compared to 10/09/2021 FINDINGS: Nasogastric tube traverses stomach with tip projecting over distal gastric antrum. Patient rotated to the RIGHT. Nonobstructive bowel gas pattern. Subsegmental atelectasis LEFT lung base. IMPRESSION: Tip of nasogastric tube projects over distal gastric antrum. Electronically Signed   By: Ulyses Southward M.D.   On: 05/04/2022 14:13   DG Chest Portable 1 View  Result Date: 05/04/2022 CLINICAL DATA:  ETT placement EXAM: PORTABLE CHEST 1 VIEW COMPARISON:  Oct 06, 2021 KUB FINDINGS: An ET tube is been placed in the interval, terminating in the mid trachea, in good position. Stable cardiomegaly. The hila and mediastinum are unchanged. No pneumothorax. Bibasilar opacities demonstrate streaky platelike character. No nodules or masses. No other acute abnormalities. IMPRESSION: 1. The ET tube terminates in the mid trachea, in good position. 2. Platelike and streaky opacities in the bases favored represent atelectasis rather than pneumonia or aspiration. Electronically Signed   By: Gerome Sam III M.D.   On: 05/04/2022 13:36         Latest Ref Rng & Units 12/23/2021    1:24 PM  PFT Results  DLCO uncorrected ml/min/mmHg 17.79   P  DLCO UNC% % 60  P  DLCO corrected ml/min/mmHg 17.79  P  DLCO COR %Predicted % 60  P  DLVA Predicted % 129  P  TLC L 4.55  P  TLC % Predicted % 59  P  RV % Predicted %  66  P    P Preliminary result    No results found for: "NITRICOXIDE"      Assessment & Plan:   Acute on chronic respiratory failure with hypoxia and hypercapnia (HCC) Recent hospitalization with acute on chronic hypoxic and hypercarbic respiratory failure requiring vent support. Patient had multiple similar hospitalizations-needs to restart trilogy device at bedtime.  And with naps.  Also encouraged on oxygen compliance  Plan  Patient Instructions  Begin Spiriva 2 puffs daily  Continue on Advair Twice daily   Continue on Oxygen 2l/m - wear at all times.  Restart Trilogy At bedtime and naps  Work on weight loss.  Albuterol inhaler or neb As needed   Activity as tolerated  Continue with Rehab/PT  Continue follow up with Cardiology  Chest xray today.  Follow up with Dr. Thora Lance in 3-4 weeks and As needed   Please contact office for sooner follow up if symptoms do not improve or worsen or seek emergency care      Chronic diastolic heart failure (HCC) Prone to recurrent decompensation.  Continue to take diuretics and follow-up with cardiology as planned Oxygen compliance encouraged.  OSA (obstructive sleep apnea) Untreated sleep apnea and OHS.  Needs to restart noninvasive support at bedtime.. If unable to get Trilogy would recommend a titration study.  Stage 3a chronic kidney disease (CKD) (HCC) Continue follow-up with PCP and ongoing monitoring  Class 3 obesity (HCC) Discussed healthy weight loss   - discussed how weight can impact sleep and risk for sleep disordered breathing - discussed options to assist with weight loss: combination of diet modification, cardiovascular and strength training exercises   - had an extensive discussion regarding the adverse health consequences related to untreated  sleep disordered breathing - specifically discussed the risks for hypertension, coronary artery disease, cardiac dysrhythmias, cerebrovascular disease, and diabetes - lifestyle modification discussed   - discussed how sleep disruption can increase risk of accidents, particularly when driving - safe driving practices were discussed    I spent   45 minutes dedicated to the care of this patient on the date of this encounter to include pre-visit review of records, face-to-face time with the patient discussing conditions above, post visit ordering of testing, clinical documentation with the electronic health record, making appropriate referrals as documented, and communicating necessary findings to members of the patients care team.   Rubye Oaks, NP 05/27/2022

## 2022-06-02 ENCOUNTER — Ambulatory Visit: Payer: Medicaid Other

## 2022-06-02 NOTE — Therapy (Signed)
Erroneous note. PT went to get patient for his 1445 appointment time and he stated he had to leave for another appointment at 1500.   Rescheduled appointment.  Ward Chatters PT  06/02/22 5:19 PM

## 2022-06-07 ENCOUNTER — Ambulatory Visit: Payer: Medicaid Other

## 2022-06-07 DIAGNOSIS — R2689 Other abnormalities of gait and mobility: Secondary | ICD-10-CM

## 2022-06-07 DIAGNOSIS — R262 Difficulty in walking, not elsewhere classified: Secondary | ICD-10-CM

## 2022-06-07 DIAGNOSIS — M6281 Muscle weakness (generalized): Secondary | ICD-10-CM

## 2022-06-07 NOTE — Therapy (Addendum)
OUTPATIENT PHYSICAL THERAPY TREATMENT NOTE/DISCHARGE  PHYSICAL THERAPY DISCHARGE SUMMARY  Visits from Start of Care: 2  Current functional level related to goals / functional outcomes: See goals/objective   Remaining deficits: Unable to assess   Education / Equipment: HEP   Patient agrees to discharge. Patient goals were  unable to assess . Patient is being discharged due to not returning since the last visit.   Patient Name: Zachary Hawkins MRN: [id number] DOB:01-27-1956, 67 y.o., male Today's Date: 06/07/2022  PCP: Sebastian Ache, PA-C  REFERRING PROVIDER: Alberteen Sam, MD   END OF SESSION:   PT End of Session - 06/07/22 1419     Visit Number 2    Number of Visits 17    Date for PT Re-Evaluation 07/22/22    Authorization Type [employer]    PT Start Time 1420    PT Stop Time 1458    PT Time Calculation (min) 38 min    Activity Tolerance Patient tolerated treatment well    Behavior During Therapy WFL for tasks assessed/performed             Past Medical History:  Diagnosis Date   Asthma    BPH (benign prostatic hyperplasia)    Cardiomyopathy (HCC) 06/2017   EF 40-45%   CHF (congestive heart failure) (HCC)    Chronic renal insufficiency, stage 3 (moderate) (HCC)    COPD (chronic obstructive pulmonary disease) (HCC)    Diastolic dysfunction 06/2017   grade 2    Hyperlipidemia    Hypertension    Hypertensive urgency    Obesity    Obesity    OSA (obstructive sleep apnea)    Paroxysmal atrial fibrillation (HCC) 2022   on Eliquis   History reviewed. No pertinent surgical history. Patient Active Problem List   Diagnosis Date Noted   Stage 3a chronic kidney disease (CKD) (HCC) 05/06/2022   ARF (acute renal failure) (HCC) 10/07/2021   SIRS (systemic inflammatory response syndrome) (HCC) 10/06/2021   Acute metabolic encephalopathy    Chronic diastolic heart failure (HCC)    Chronic obstructive pulmonary disease with acute  exacerbation (HCC)    Hyperkalemia    Class 3 obesity (HCC) 07/18/2021   Acute on chronic diastolic heart failure (HCC) 07/17/2021   BPH (benign prostatic hyperplasia)    Hyperlipidemia    Acute on chronic respiratory failure with hypoxia and hypercapnia (HCC) 06/23/2021   Chronic diastolic CHF (congestive heart failure) (HCC) 06/23/2021   Acute on chronic diastolic (congestive) heart failure (HCC) 04/30/2021   AKI (acute kidney injury) (HCC) 04/29/2021   Chronic respiratory failure with hypercapnia (HCC) 04/29/2021   Bilateral hydrocele 04/29/2021   Dyspnea on exertion 04/29/2021   Acute respiratory failure with hypoxia (HCC) 03/10/2021   Urticaria 01/15/2021   Chronic heart failure with preserved ejection fraction (HCC)    Angioedema 01/04/2021   Influenza vaccine refused 06/26/2020   COVID-19 vaccine series completed 06/26/2020   Paroxysmal atrial fibrillation (HCC) 07/02/2019   Secondary hypercoagulable state (HCC) 07/02/2019   OSA (obstructive sleep apnea) 04/03/2019   History of angioedema due to ACE INHIBITORS 10/13/2017   Non compliance w medication regimen 07/13/2017   Essential hypertension 06/23/2017   Acute kidney injury superimposed on chronic kidney disease (HCC)    Cardiomyopathy (HCC) 06/06/2017   Dyspnea 06/05/2017    REFERRING DIAG:  R06.89 (ICD-10-CM) - Hypercarbia R53.1 (ICD-10-CM) - Generalized weakness  THERAPY DIAG:  Difficulty in walking, not elsewhere classified  Muscle weakness (generalized)  Other abnormalities of  gait and mobility  Rationale for Evaluation and Treatment Rehabilitation  PERTINENT HISTORY:  COPD with chronic hypoxemic respiratory failure, diastolic heart failure, HTN, paroxysmal fibrillation, CKD, morbid obesity    PRECAUTIONS: Fall and Other: O2   SUBJECTIVE:                                                                                                                                                                                       SUBJECTIVE STATEMENT:  Pt presents to PT with reports of decreased back pain. Notes that first thing in the morning is still the worst. Pt is ready to begin PT at this time.    PAIN:  Are you having pain?  Yes: NPRS scale: 6/10 Worst: 10/10 at worst Pain location: lower back Pain description: sharp, tight Aggravating factors: prolonged sitting, lying  Relieving factors: medication   OBJECTIVE: (objective measures completed at initial evaluation unless otherwise dated)  VITALS:  O2 sats: 86-84 on 2L during session   PATIENT SURVEYS:  FOTO: 34% function; 44% predicted   COGNITION: Overall cognitive status: Within functional limits for tasks assessed                         SENSATION: WFL   POSTURE: rounded shoulders, forward head, and larger body habitus   PALPATION: TTP to lumbar paraspinals    LOWER EXTREMITY MMT:   MMT Right eval Left eval  Hip flexion      Hip extension      Hip abduction      Hip adduction      Hip internal rotation      Hip external rotation      Knee flexion      Knee extension      Ankle dorsiflexion      Ankle plantarflexion      Ankle inversion      Ankle eversion       (Blank rows = not tested)   FUNCTIONAL TESTS:  30 Second Sit to Stand: 5 reps 2MWT: 16795ft   GAIT: Distance walked: 5050ft Assistive device utilized: None - pulling O2 tanke Level of assistance: SBA Comments: decreased gait speed, wide BoS   TREATMENT: [facility] Adult PT Treatment:                                                DATE: 06/07/2022 Therapeutic Exercise: NuStep lvl 5 UE/LE x 5 min while taking subjective STS 2x10 LAQ 2x10 3# Seated march 3x20 3# Seated  hamstring curl 2x15 black TB Standing hip abd - heavy UE reliance x 10  [facility] Adult PT Treatment:                                                DATE: 05/27/2022 Therapeutic Exercise: STS x 5 Seated clamshell x 20 Black TB   PATIENT EDUCATION:  Education details: eval findings, FOTO ,HEP,  POC Person educated: Patient Education method: Explanation, Demonstration, and Handouts Education comprehension: verbalized understanding and returned demonstration   HOME EXERCISE PROGRAM: Access Code: [email or web address] URL: [email or web address] Date: 05/27/2022 Prepared by: Edwinna Areola   Exercises - Sit to Stand with Arms Crossed  - 1 x daily - 7 x weekly - 3 sets - 10 reps - Seated Hip Abduction with Resistance  - 1 x daily - 7 x weekly - 3 sets - 20 reps - black band hold   ASSESSMENT:   CLINICAL IMPRESSION: Pt was able to complete prescribed exercises, does have difficulty with aerobic and higher level activity. Therapy today focused on improving functional activity tolerance and proximal hip strength. He continues to benefit from skilled PT, will continue per POC.    OBJECTIVE IMPAIRMENTS: decreased activity tolerance, decreased balance, decreased endurance, decreased mobility, difficulty walking, decreased ROM, decreased strength, and pain.    ACTIVITY LIMITATIONS: carrying, lifting, sitting, standing, squatting, sleeping, stairs, transfers, bed mobility, bathing, self feeding, and reach over head   PARTICIPATION LIMITATIONS: driving, shopping, community activity, occupation, and yard work   PERSONAL FACTORS: Time since onset of injury/illness/exacerbation and 3+ comorbidities: COPD with chronic hypoxemic respiratory failure, diastolic heart failure, HTN, paroxysmal fibrillation, CKD, morbid obesity   are also affecting patient's functional outcome.    REHAB POTENTIAL: Fair to Guarded secondary to complex PMH and current physical condition    CLINICAL DECISION MAKING: Evolving/moderate complexity   EVALUATION COMPLEXITY: Moderate     GOALS: Goals reviewed with patient? No   SHORT TERM GOALS: Target date: 06/17/2022   Pt will be compliant and knowledgeable with initial HEP for improved comfort and carryover Baseline: initial HEP given  Goal status: INITIAL   2.   Pt will self report low back pain no greater than 6/10 for improved comfort and functional ability Baseline: 10/10 at worst Goal status: INITIAL    LONG TERM GOALS: Target date: 07/22/2022   Pt will improve FOTO function score to no less than 44% as proxy for functional improvement Baseline: 34% function Goal status: INITIAL    2.  Pt will increase 30 Second Sit to Stand rep count to no less than 8 reps for improved balance, strength, and functional mobility Baseline: 5 reps  Goal status: INITIAL    3.  Pt will improve distance to no less than 270ft for improved safety and endurance with home and community navigation Baseline: 145ft Goal status: INITIAL   4.  Pt will be able to stand greater than 15 minutes without increase in LBP in order to more comfortably Barbar breakfast and other home ADLS Baseline: unable Goal status: INITIAL   PLAN:   PT FREQUENCY: 2x/week   PT DURATION: 8 weeks   PLANNED INTERVENTIONS: Therapeutic exercises, Therapeutic activity, Neuromuscular re-education, Balance training, Gait training, Patient/Family education, Self Care, Joint mobilization, Aquatic Therapy, Dry Needling, Electrical stimulation, Cryotherapy, Moist heat, Manual therapy, and Re-evaluation   PLAN FOR NEXT SESSION: assess HEP response, general  strengthening, improving standing and functional activity tolerance    Eloy End, PT 06/07/2022, 3:04 PM

## 2022-06-08 NOTE — Progress Notes (Signed)
ATC x1.  No answer.  Unable to leave a vm as vm was full.

## 2022-06-10 ENCOUNTER — Inpatient Hospital Stay (HOSPITAL_COMMUNITY): Payer: Medicaid Other

## 2022-06-10 ENCOUNTER — Telehealth (HOSPITAL_COMMUNITY): Payer: Self-pay | Admitting: Adult Health

## 2022-06-10 NOTE — Progress Notes (Incomplete)
ADVANCED HF CLINIC NOTE  Primary Care: McVey, Gelene Mink, PA-C Primary Cardiologist: Dr. Sallyanne Hawkins HF Cardiologist: Dr. Aundra Hawkins  HPI: Zachary Hawkins is a 67 y.o. with a h/o HFpEF, OSA, CPAP, smoker, CKD Stage III, HTN, obesity,and PAF. Has had multiple ED/hospital visits over the last year.  Ongoing compliance issues.    Admitted 03/2022 with A/C hypoxic/hypercarbic respiratory failure and volume overload. Treated with antibiotics steroids, nebs and diuretics.    Admitted 04/29/22 with increased shortness of breath in the setting of HF exacerbation. Diuresed with IV lasix and transitioned to lasix 80 mg twice a day   Evaluated in the ED 05/2021 with COPD exacerbation. Treated with nebulizers and steroids.    Admitted 06/23/21 with A/C respiratory failure and heart exacerbation. Placed on Bipap with improvement. Diuresed with IV lasix and transitioned to torsemide 100 mg daily. D/C weigth 344 pounds. Discharged 06/26/21 .   Seen in [facility] 3/23, volume up, likely in setting of high salt intake. Instructed to take extra torsemide 50 mg daily.   Admitted 07/17/21 with a/c CHF, diuresed with IV lasix. Discharged home, weight 338 lbs.  Post hospital follow up 3/23, NYHA III, volume OK on exam.   Admitted 4/23 with acute on chronic respiratory failure 2/2 CPAP noncompliance and unintentional pain medication overdose. Failed NIV and required intubation. CHF compensated.  Admitted 5/23 with SOB and AKI. GDMT held. Weight down to 325 lbs at discharge, new SCr baseline 1.5. Amlodipine, torsemide and spiro stopped at discharge. Saw general cardiology at follow up and torsemide restarted at 60 mg daily and spiro at 25 mg daily.  Admitted 12/26 -05/08/23 with A/C respiratory failure and A/C HFpEF. Diuresed with IV lasix and transitioned to torsemide 80 mg daily.   Today he returns for post hospital follow up.  Denies SOB/PND/Orthopnea. Appetite ok. No fever or chills. Weight at home  pounds. Taking all  medications.    Labs (6/23): K 4.7, creatinine 1.35 Labs (7/23): BNP 19.5, K 4.6, creatinine 1.58 Labs (9/23): LDL 52, HDL 47, TGs 213 Labs (10/23): K 4.5, creatinine 1.66 Labs (05/08/22): Creatinine 1.14 K 4.2    PMH: 1. CKD stage 3 2. OSA: Uses CPAP.  3. HTN 4. Atrial fibrillation: Paroxysmal.  5. COPD: Prior smoker, quit in early 2023.  - Uses 2 L home oxygen.  6. Erectile dysfunction 7. Chronic diastolic CHF: Echo (54/65) with EF 60-65%, normal RV, normal IVC.  8. Hyperlipidemia  ROS: All systems reviewed and negative except as per HPI.   Current Outpatient Medications  Medication Sig Dispense Refill   albuterol (PROVENTIL) (2.5 MG/3ML) 0.083% nebulizer solution Take 3 mLs (2.5 mg total) by nebulization every 6 (six) hours as needed for wheezing or shortness of breath. 75 mL 5   albuterol (VENTOLIN HFA) 108 (90 Base) MCG/ACT inhaler Inhale 2 puffs into the lungs every 4 (four) hours as needed for wheezing or shortness of breath. 18 g 0   apixaban (ELIQUIS) 5 MG TABS tablet Take 1 tablet (5 mg total) by mouth 2 (two) times daily. 60 tablet 11   atorvastatin (LIPITOR) 10 MG tablet Take 1 tablet (10 mg total) by mouth daily. 90 tablet 3   bisoprolol (ZEBETA) 5 MG tablet Take 0.5 tablets (2.5 mg total) by mouth daily. 45 tablet 3   dapagliflozin propanediol (FARXIGA) 10 MG TABS tablet Take 1 tablet (10 mg total) by mouth daily. 30 tablet 11   EPINEPHrine 0.3 mg/0.3 mL IJ SOAJ injection Inject 0.3 mg into the muscle as needed  for anaphylaxis. (Patient not taking: Reported on 05/27/2022) 1 each 1   famotidine (PEPCID) 20 MG tablet Take 1 tablet (20 mg total) by mouth 2 (two) times daily. 28 tablet 0   fluticasone (FLONASE) 50 MCG/ACT nasal spray PLACE 1 SPRAY INTO BOTH NOSTRILS DAILY. 16 g 11   fluticasone-salmeterol (ADVAIR HFA) 115-21 MCG/ACT inhaler Inhale 2 puffs into the lungs 2 (two) times daily.     mupirocin ointment (BACTROBAN) 2 % Place 1 application. into the nose 2 (two)  times daily. (Patient taking differently: Place 1 application  into the nose daily as needed.) 22 g 0   pantoprazole (PROTONIX) 40 MG tablet Take 1 tablet (40 mg total) by mouth daily. 30 tablet 0   polyethylene glycol (MIRALAX / GLYCOLAX) 17 g packet Take 17 g by mouth 2 (two) times daily. (Patient taking differently: Take 17 g by mouth daily as needed. As needed) 14 each 0   saline (AYR) GEL Place 1 application into both nostrils at bedtime. (Patient taking differently: Place 1 application  into both nostrils at bedtime as needed (for congestion).) 14.1 g 11   spironolactone (ALDACTONE) 25 MG tablet Take 1 tablet (25 mg total) by mouth daily. 90 tablet 3   tamsulosin (FLOMAX) 0.4 MG CAPS capsule Take 1 capsule (0.4 mg total) by mouth daily after supper. 30 capsule 2   Tiotropium Bromide Monohydrate (SPIRIVA RESPIMAT) 2.5 MCG/ACT AERS Inhale 2 puffs into the lungs daily. 1 g 5   torsemide (DEMADEX) 20 MG tablet Patient takes 4 tablets by mouth daily.     No current facility-administered medications for this visit.   Allergies  Allergen Reactions   Ace Inhibitors Anaphylaxis   Black Cherry Fruit Extract Marcelline Mates Extract] Anaphylaxis    Tongue   Grenadine Flavor [Flavoring Agent] Anaphylaxis   Social History   Socioeconomic History   Marital status: Single    Spouse name: Not on file   Number of children: Not on file   Years of education: Not on file   Highest education level: Not on file  Occupational History   Not on file  Tobacco Use   Smoking status: Former    Packs/day: 1.00    Years: 36.00    Total pack years: 36.00    Types: Cigarettes    Quit date: 01/08/2021    Years since quitting: 1.4   Smokeless tobacco: Never  Vaping Use   Vaping Use: Never used  Substance and Sexual Activity   Alcohol use: Yes    Alcohol/week: 1.0 standard drink of alcohol    Types: 1 Shots of liquor per week    Comment: socially   Drug use: Yes    Types: Marijuana    Comment: every other week    Sexual activity: Not on file  Other Topics Concern   Not on file  Social History Narrative   Not on file   Social Determinants of Health   Financial Resource Strain: High Risk (07/15/2021)   Overall Financial Resource Strain (CARDIA)    Difficulty of Paying Living Expenses: Very hard  Food Insecurity: No Food Insecurity (07/15/2021)   Hunger Vital Sign    Worried About Running Out of Food in the Last Year: Never true    Ran Out of Food in the Last Year: Never true  Transportation Needs: No Transportation Needs (07/15/2021)   PRAPARE - Hydrologist (Medical): No    Lack of Transportation (Non-Medical): No  Physical Activity: Not on file  Stress: Not on file  Social Connections: Not on file  Intimate Partner Violence: Not on file   Family History  Problem Relation Age of Onset   Diabetes Mother    Diabetes Father    There were no vitals taken for this visit.  Wt Readings from Last 3 Encounters:  05/27/22 (!) 155.6 kg (343 lb)  05/08/22 (!) 150.2 kg (331 lb 1.6 oz)  04/05/22 (!) 155.7 kg (343 lb 3.2 oz)   PHYSICAL EXAM: General:  Well appearing. No resp difficulty HEENT: normal Neck: supple. no JVD. Carotids 2+ bilat; no bruits. No lymphadenopathy or thryomegaly appreciated. Cor: PMI nondisplaced. Regular rate & rhythm. No rubs, gallops or murmurs. Lungs: clear Abdomen: soft, nontender, nondistended. No hepatosplenomegaly. No bruits or masses. Good bowel sounds. Extremities: no cyanosis, clubbing, rash, edema Neuro: alert & orientedx3, cranial nerves grossly intact. moves all 4 extremities w/o difficulty. Affect pleasant   ASSESSMENT & PLAN: 1. Chronic diastolic heart failure: Echo in 12/22 with EF 60-65%, RV normal. Multiple recent admissions.  On exam, he does not look volume overloaded, his weight has trended down.   - NYHA  - Continue torsemide 80 mg daily + - Continue bisoprolol 2.5 mg daily. - Continue Farxiga 10 mg daily. - Continue  spironolactone 25 mg daily. - Angioedema with ACEI so not a candidate for ARNI.  - - ? Cardiomems, but he has Medicaid => he would be interested, may be able to get this when he goes on Medicare.  2. Atrial fibrillation: Paroxysmal.  - Continue bisoprolol.  - Continue Eliquis 5 mg bid. No bleeding issues. 3. OSA: Reinforced compliance w/ Bipap.  4. COPD: Prior smoker.  Uses home oxygen.  5. Obesity: There is no height or weight on file to calculate BMI. - He is now on Ozempic 0.25 mg daily, just started. 6. CKD Stage IIIa: Baseline around 1.3-1.6.  - Continue SGLT2i. No GU symptoms - BMET today. 7. Hyperlipidemia: Good lipids 9/23. 8. Hand tremors: ? essential vs physiologic tremor. Consider referral to Neurology. - Check CMET and TSH.   Follow up    Darrick Grinder NP- C 06/10/2022

## 2022-06-10 NOTE — Telephone Encounter (Signed)
Pt 2nd no show appt, pt called left vm, he had family emergency

## 2022-06-11 IMAGING — DX DG ABDOMEN ACUTE W/ 1V CHEST
4 series · 4 of 4 positions shown · non-contrast
Comparison: 08/17/2021

CLINICAL DATA: Shortness of breath and constipation. Abdominal
pain.

EXAM:
DG ABDOMEN ACUTE WITH 1 VIEW CHEST

[chest pa]
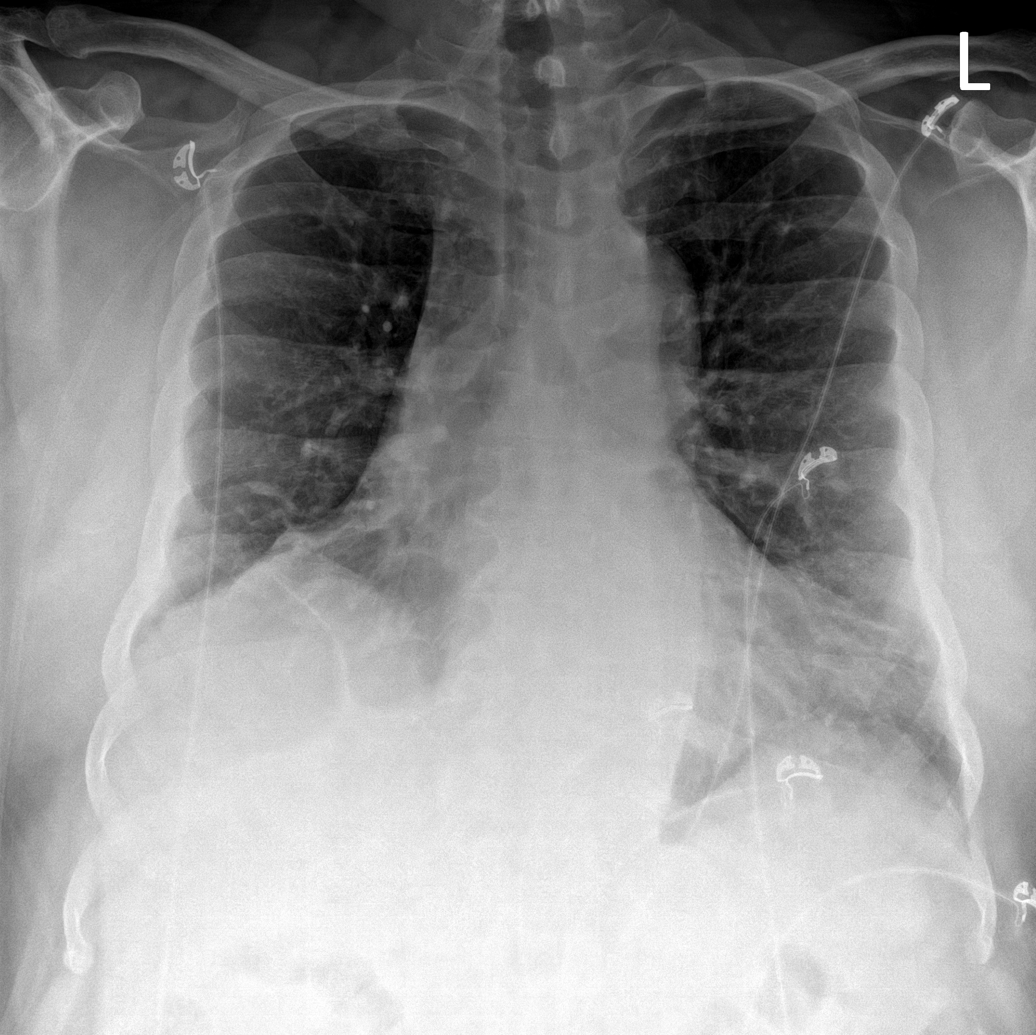

[abdomen erect]
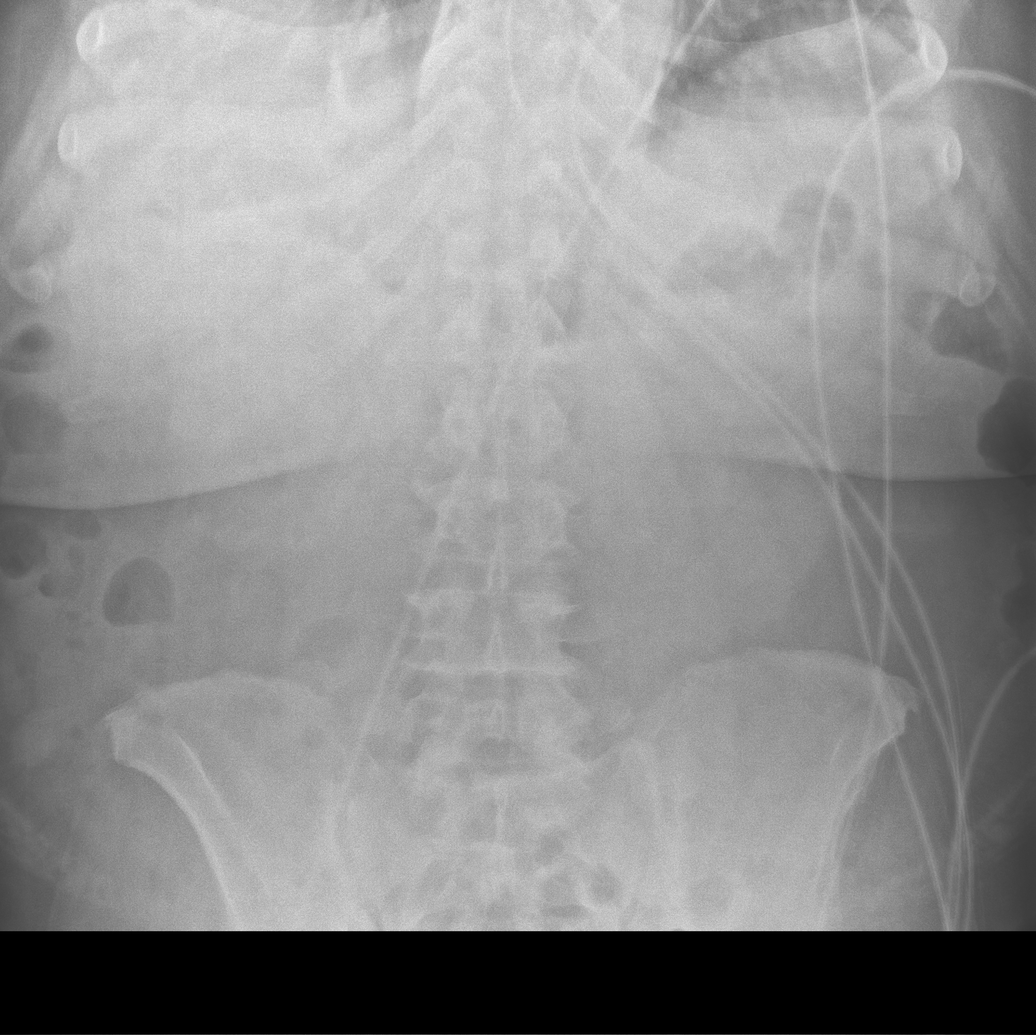

[abdomen supine (1 of 2)]
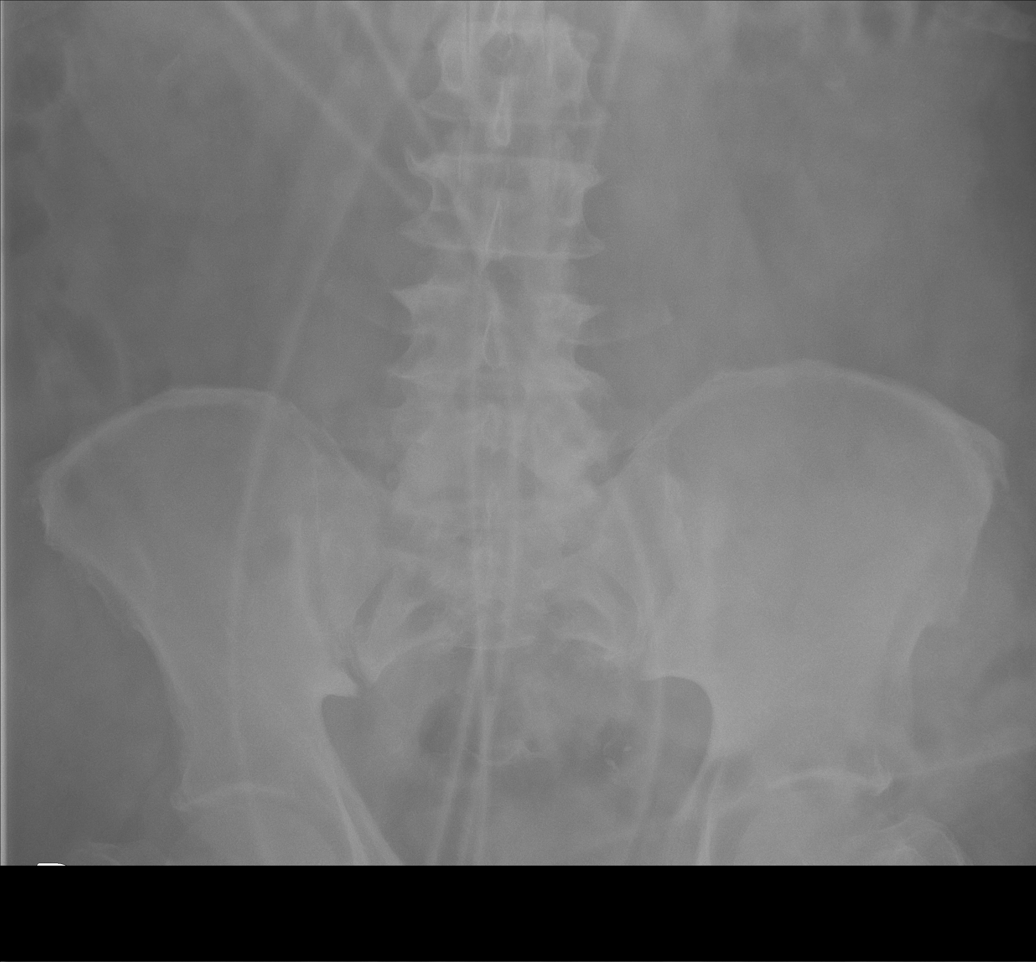

[abdomen supine (2 of 2)]
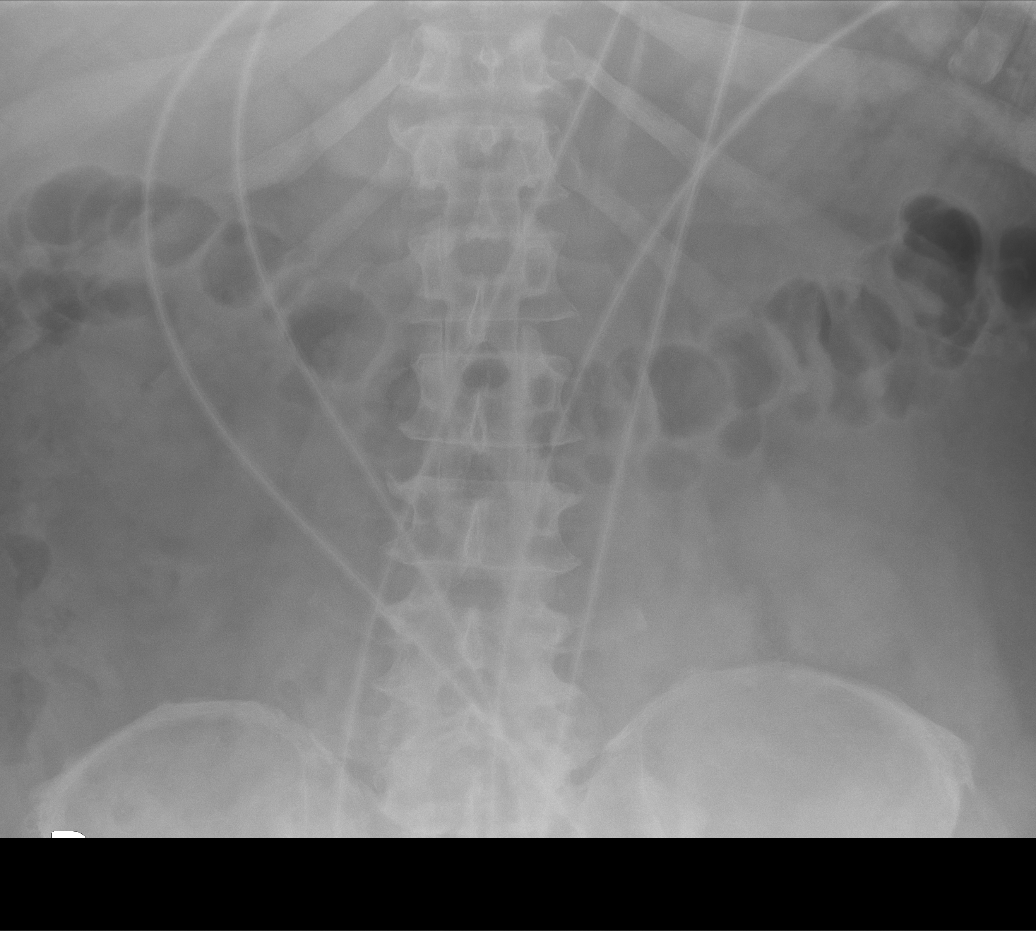

[4 of 4 positions shown; findings below may reference images not displayed]

FINDINGS: Previous endotracheal and nasogastric tubes are no longer present.
Improved aeration in the right lung with some remaining volume loss
and bandlike density at the right lung base. Thoracic spondylosis.
Atherosclerotic calcification of the aortic arch.

Scattered gas in the colon.  The small bowel is mostly gasless.

Thoracolumbar spondylosis noted.
IMPRESSION: 1. Substantially improved aeration in the right lung with only some
bandlike atelectasis or scarring at the right lung base remaining.
2.  Aortic Atherosclerosis ([id number]).
3. Thoracolumbar spondylosis.
4. No specific bowel gas abnormality is identified although much of
the small bowel is gasless. No significant amount of colonic stool
noted.

## 2022-06-14 IMAGING — DX DG ABD PORTABLE 1V
2 series · 2 of 2 positions shown · non-contrast
Comparison: October 06, 2021.

CLINICAL DATA: Abdominal distension.

EXAM:
PORTABLE ABDOMEN - 1 VIEW

[abdomen kub (1 of 2)]
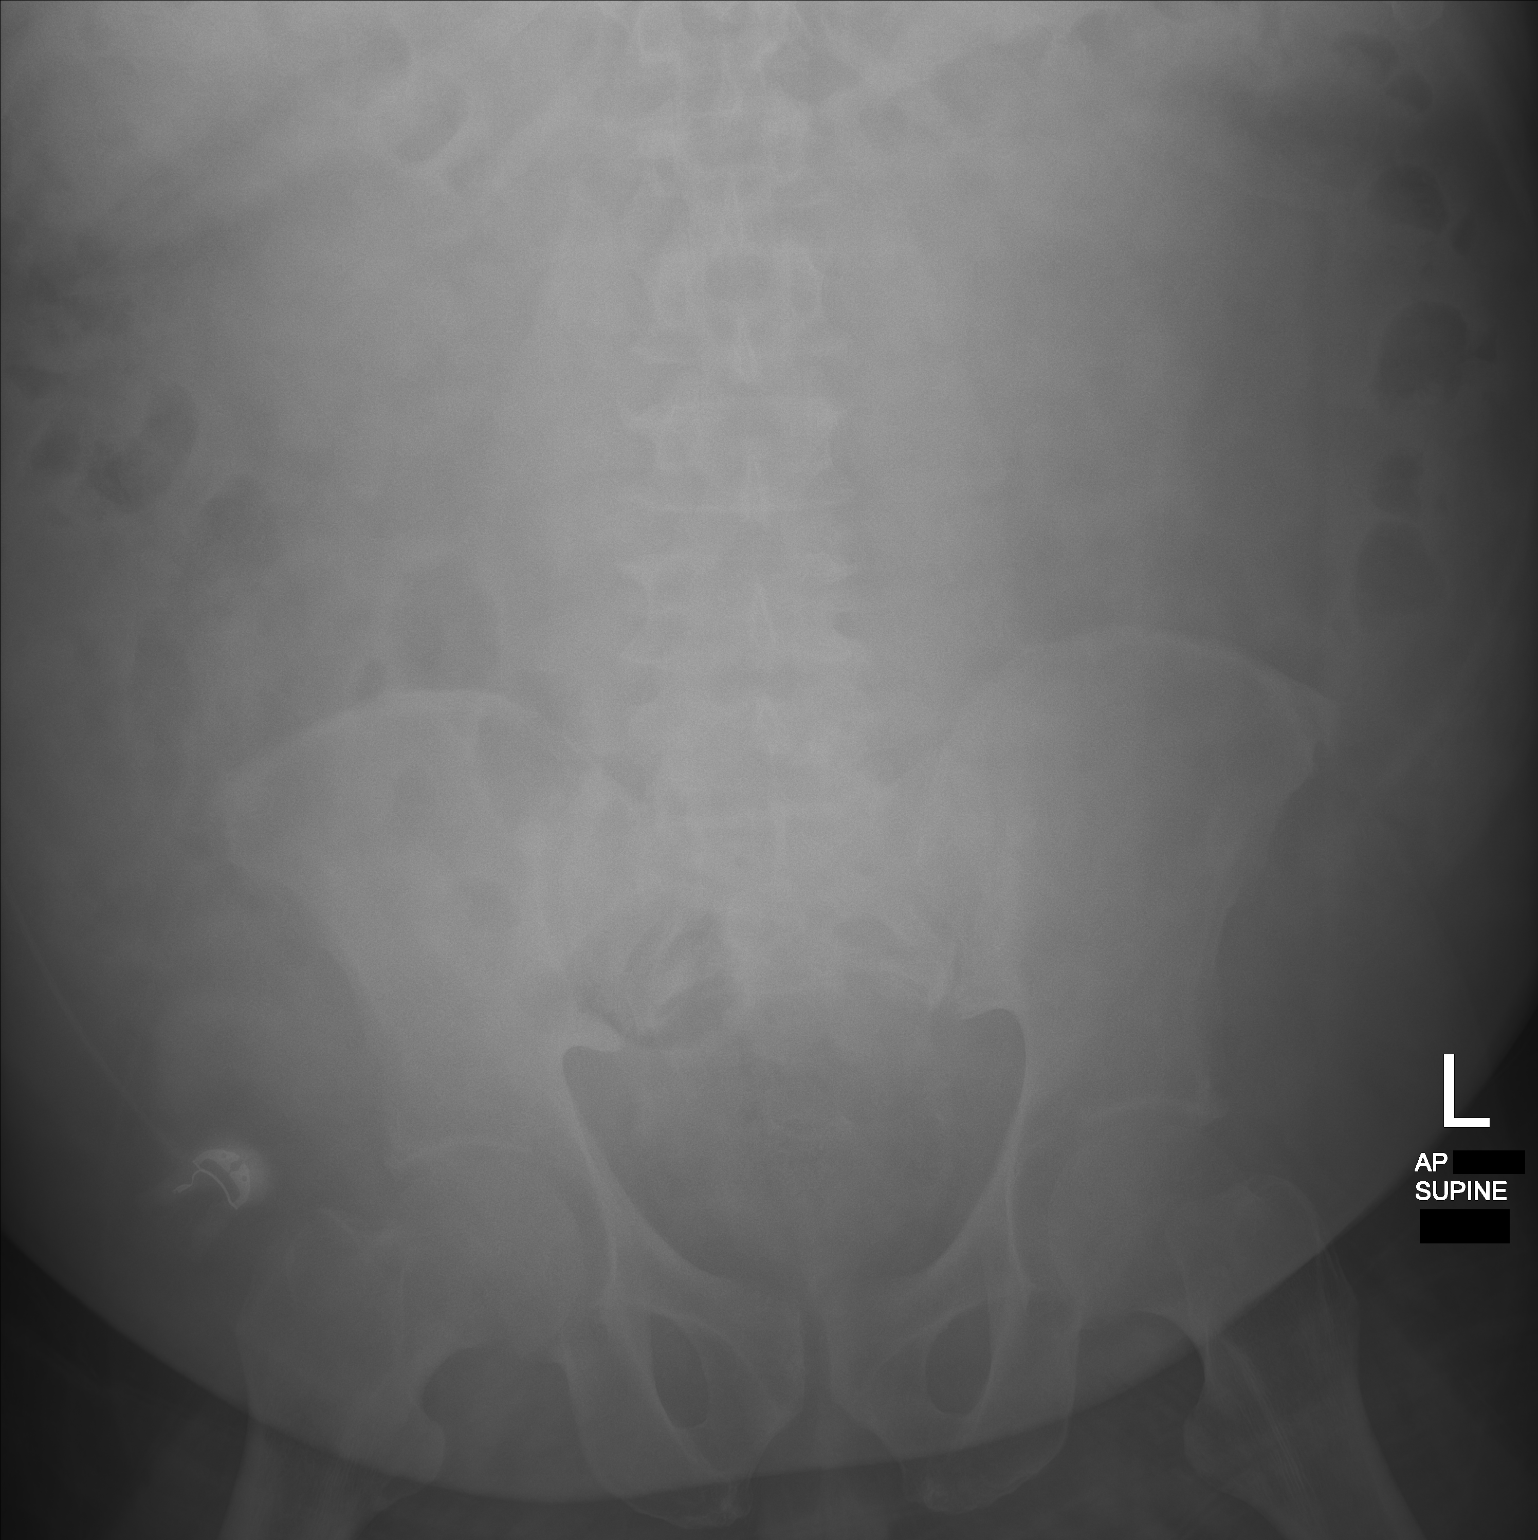

[abdomen kub (2 of 2)]
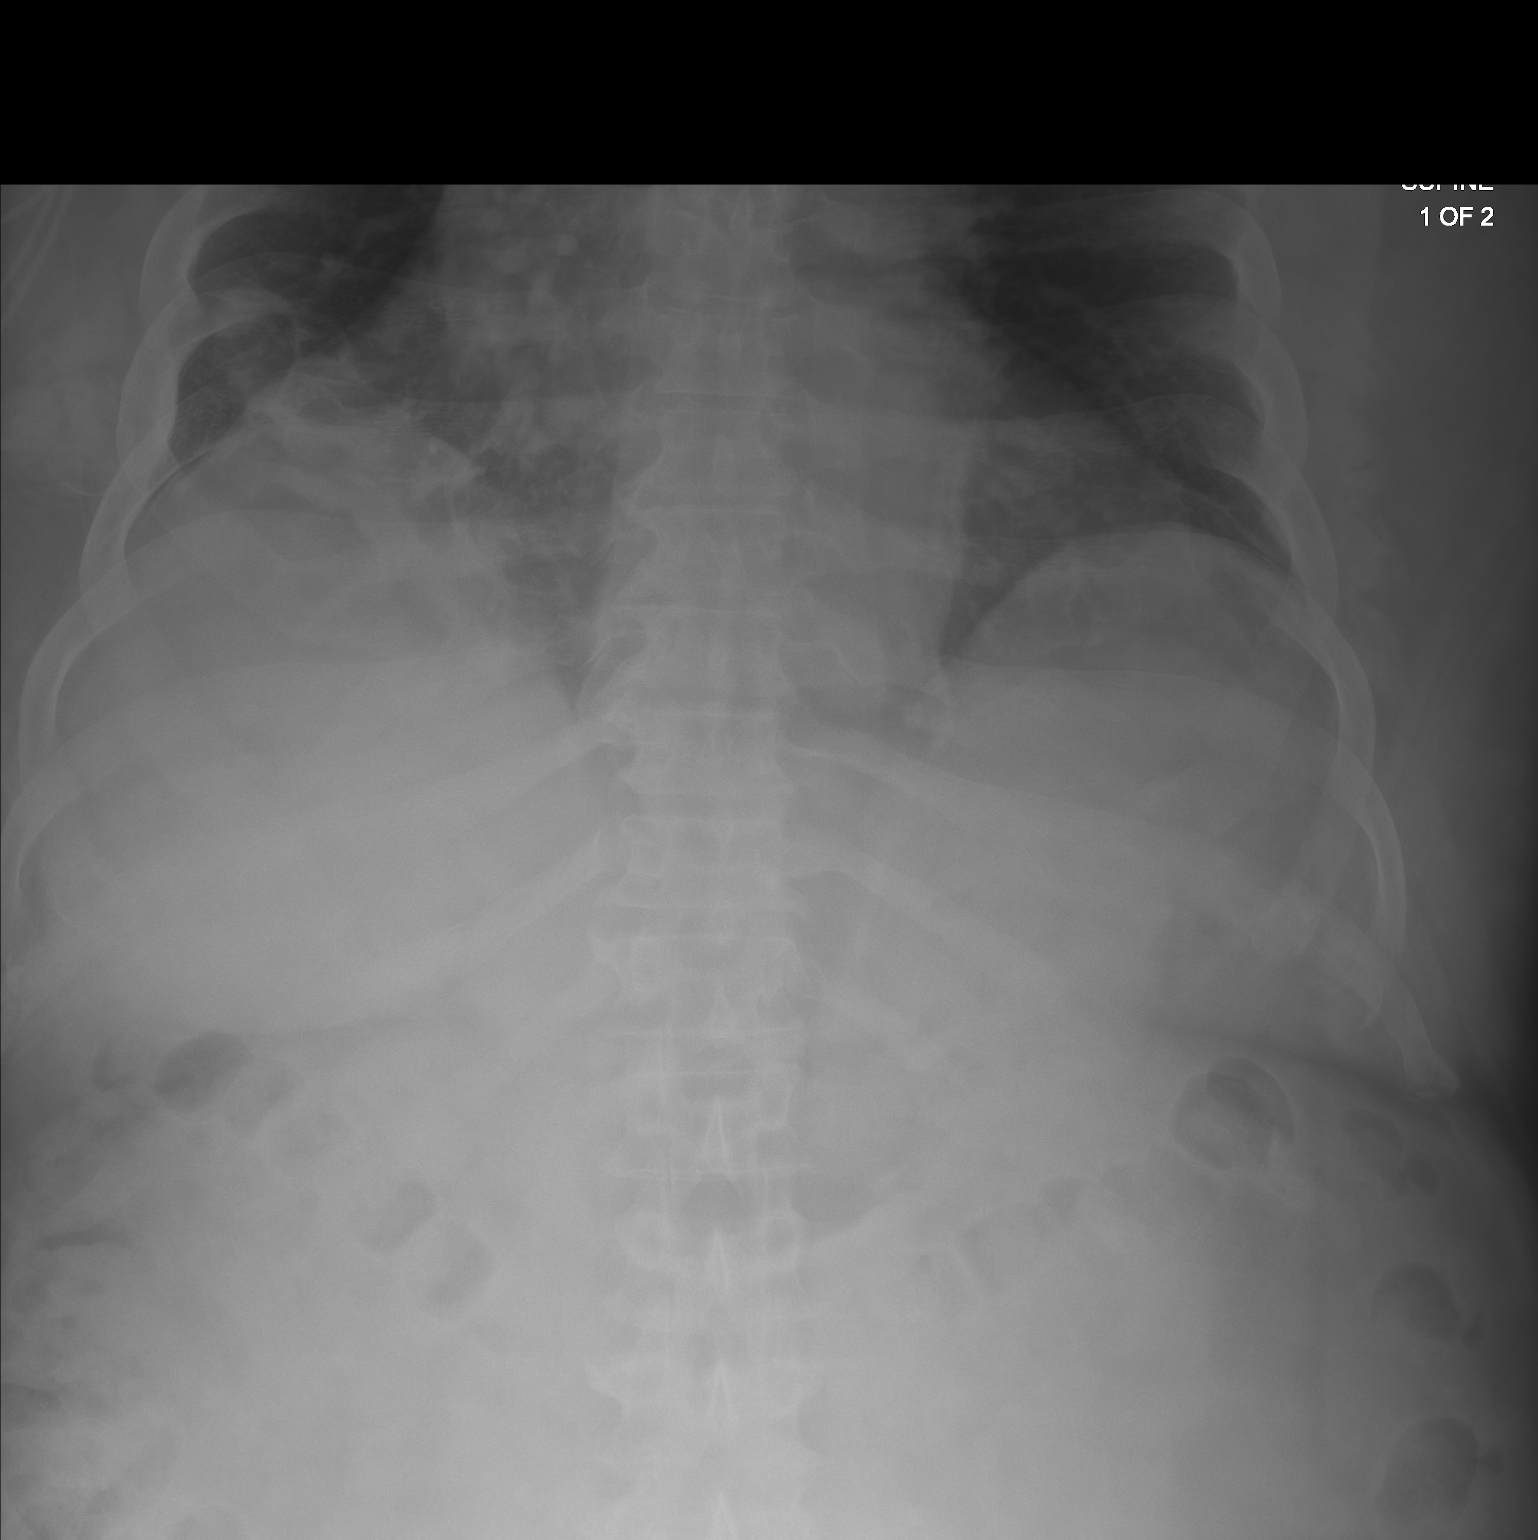

[2 of 2 positions shown; findings below may reference images not displayed]

FINDINGS: The bowel gas pattern is normal. No radio-opaque calculi or other
significant radiographic abnormality are seen.
IMPRESSION: Negative.

## 2022-06-15 ENCOUNTER — Encounter: Payer: Self-pay | Admitting: *Deleted

## 2022-06-15 NOTE — Progress Notes (Signed)
ATC x2.  No answer.  VM full.  Unable to leave a message.

## 2022-06-16 ENCOUNTER — Encounter: Payer: Self-pay | Admitting: *Deleted

## 2022-06-16 NOTE — Progress Notes (Signed)
ATC x3.  VM full.  Mychart message sent.  Unable to reach letter sent.

## 2022-06-18 NOTE — Progress Notes (Signed)
Closing encounter d/t no response by patient after multiple attempts to reach patient according to policy.

## 2022-06-29 ENCOUNTER — Other Ambulatory Visit (HOSPITAL_BASED_OUTPATIENT_CLINIC_OR_DEPARTMENT_OTHER): Payer: Self-pay

## 2022-06-29 DIAGNOSIS — G4733 Obstructive sleep apnea (adult) (pediatric): Secondary | ICD-10-CM

## 2022-06-30 ENCOUNTER — Telehealth (HOSPITAL_COMMUNITY): Payer: Self-pay | Admitting: Licensed Clinical Social Worker

## 2022-06-30 ENCOUNTER — Ambulatory Visit: Payer: Medicaid Other | Attending: Family Medicine

## 2022-06-30 NOTE — Telephone Encounter (Signed)
Patient called back after receiving letter about chest xray results. Scheduled patient follow up appointment 2/29, patient requesting a call back prior.

## 2022-06-30 NOTE — Therapy (Incomplete)
OUTPATIENT PHYSICAL THERAPY TREATMENT NOTE   Patient Name: Zachary Hawkins MRN: [id number] DOB:09/29/1955, 67 y.o., male Today's Date: 06/30/2022  PCP: Dorise Hiss, PA-C  REFERRING PROVIDER: Edwin Dada, MD   END OF SESSION:     Past Medical History:  Diagnosis Date   Asthma    BPH (benign prostatic hyperplasia)    Cardiomyopathy ([place]) 06/2017   EF 40-45%   CHF (congestive heart failure) ([place])    Chronic renal insufficiency, stage 3 (moderate) ([place])    COPD (chronic obstructive pulmonary disease) ([place])    Diastolic dysfunction [id number]   grade 2    Hyperlipidemia    Hypertension    Hypertensive urgency    Obesity    Obesity    OSA (obstructive sleep apnea)    Paroxysmal atrial fibrillation ([place]) 2022   on Eliquis   No past surgical history on file. Patient Active Problem List   Diagnosis Date Noted   Stage 3a chronic kidney disease (CKD) ([place]) 05/06/2022   ARF (acute renal failure) ([place]) 10/07/2021   SIRS (systemic inflammatory response syndrome) ([place]) [id number]   Acute metabolic encephalopathy    Chronic diastolic heart failure (HCC)    Chronic obstructive pulmonary disease with acute exacerbation (HCC)    Hyperkalemia    Class 3 obesity ([place]) 07/18/2021   Acute on chronic diastolic heart failure ([place]) 07/17/2021   BPH (benign prostatic hyperplasia)    Hyperlipidemia    Acute on chronic respiratory failure with hypoxia and hypercapnia (HCC) 06/23/2021   Chronic diastolic CHF (congestive heart failure) ([place]) 06/23/2021   Acute on chronic diastolic (congestive) heart failure ([place]) 04/30/2021   AKI (acute kidney injury) ([place]) 04/29/2021   Chronic respiratory failure with hypercapnia ([place]) 04/29/2021   Bilateral hydrocele 04/29/2021   Dyspnea on exertion 04/29/2021   Acute respiratory failure with hypoxia ([place]) 03/10/2021   Urticaria 01/15/2021   Chronic heart failure with preserved ejection fraction (HCC)    Angioedema 01/04/2021    Influenza vaccine refused 06/26/2020   COVID-19 vaccine series completed 06/26/2020   Paroxysmal atrial fibrillation ([place]) 07/02/2019   Secondary hypercoagulable state ([place]) 07/02/2019   OSA (obstructive sleep apnea) 04/03/2019   History of angioedema due to ACE INHIBITORS 10/13/2017   Non compliance w medication regimen 07/13/2017   Essential hypertension 06/23/2017   Acute kidney injury superimposed on chronic kidney disease ([place])    Cardiomyopathy ([place]) 06/06/2017   Dyspnea 06/05/2017    REFERRING DIAG:  R06.89 (ICD-10-CM) - Hypercarbia R53.1 (ICD-10-CM) - Generalized weakness  THERAPY DIAG:  No diagnosis found.  Rationale for Evaluation and Treatment Rehabilitation  PERTINENT HISTORY:  COPD with chronic hypoxemic respiratory failure, diastolic heart failure, HTN, paroxysmal fibrillation, CKD, morbid obesity    PRECAUTIONS: Fall and Other: O2   SUBJECTIVE:  SUBJECTIVE STATEMENT:  ***    PAIN:  Are you having pain?  Yes: NPRS scale: 6/10 Worst: 10/10 at worst Pain location: lower back Pain description: sharp, tight Aggravating factors: prolonged sitting, lying  Relieving factors: medication   OBJECTIVE: (objective measures completed at initial evaluation unless otherwise dated)  VITALS:  O2 sats: 86-84 on 2L during session   PATIENT SURVEYS:  FOTO: 34% function; 44% predicted   COGNITION: Overall cognitive status: Within functional limits for tasks assessed                         SENSATION: WFL   POSTURE: rounded shoulders, forward head, and larger body habitus   PALPATION: TTP to lumbar paraspinals    LOWER EXTREMITY MMT:   MMT Right eval Left eval  Hip flexion      Hip extension      Hip abduction      Hip adduction      Hip internal rotation      Hip external  rotation      Knee flexion      Knee extension      Ankle dorsiflexion      Ankle plantarflexion      Ankle inversion      Ankle eversion       (Blank rows = not tested)   FUNCTIONAL TESTS:  30 Second Sit to Stand: 5 reps 2MWT: 161f   GAIT: Distance walked: 529fAssistive device utilized: None - pulling O2 tanke Level of assistance: SBA Comments: decreased gait speed, wide BoS   TREATMENT: [facility] Adult PT Treatment:                                                DATE: 06/30/2022 Therapeutic Exercise: NuStep lvl 5 UE/LE x 5 min while taking subjective STS 2x10 LAQ 2x10 3# Seated march 3x20 3# Seated hamstring curl 2x15 black TB Standing hip abd - heavy UE reliance x 10  [facility] Adult PT Treatment:                                                DATE: 06/07/2022 Therapeutic Exercise: NuStep lvl 5 UE/LE x 5 min while taking subjective STS 2x10 LAQ 2x10 3# Seated march 3x20 3# Seated hamstring curl 2x15 black TB Standing hip abd - heavy UE reliance x 10  [facility] Adult PT Treatment:                                                DATE: 05/27/2022 Therapeutic Exercise: STS x 5 Seated clamshell x 20 Black TB   PATIENT EDUCATION:  Education details: eval findings, FOTO ,HEP, POC Person educated: Patient Education method: Explanation, Demonstration, and Handouts Education comprehension: verbalized understanding and returned demonstration   HOME EXERCISE PROGRAM: Access Code: [email or web address]: [email or web address] Date: 05/27/2022 Prepared by: DaOctavio Manns Exercises - Sit to Stand with Arms Crossed  - 1 x daily - 7 x weekly - 3 sets - 10 reps - Seated Hip Abduction with Resistance  - 1 x daily -  7 x weekly - 3 sets - 20 reps - black band hold   ASSESSMENT:   CLINICAL IMPRESSION: ***   OBJECTIVE IMPAIRMENTS: decreased activity tolerance, decreased balance, decreased endurance, decreased mobility, difficulty walking, decreased ROM, decreased strength, and pain.     ACTIVITY LIMITATIONS: carrying, lifting, sitting, standing, squatting, sleeping, stairs, transfers, bed mobility, bathing, self feeding, and reach over head   PARTICIPATION LIMITATIONS: driving, shopping, community activity, occupation, and yard work   PERSONAL FACTORS: Time since onset of injury/illness/exacerbation and 3+ comorbidities: COPD with chronic hypoxemic respiratory failure, diastolic heart failure, HTN, paroxysmal fibrillation, CKD, morbid obesity   are also affecting patient's functional outcome.    REHAB POTENTIAL: Fair to Guarded secondary to complex PMH and current physical condition    CLINICAL DECISION MAKING: Evolving/moderate complexity   EVALUATION COMPLEXITY: Moderate     GOALS: Goals reviewed with patient? No   SHORT TERM GOALS: Target date: 06/17/2022   Pt will be compliant and knowledgeable with initial HEP for improved comfort and carryover Baseline: initial HEP given  Goal status: INITIAL   2.  Pt will self report low back pain no greater than 6/10 for improved comfort and functional ability Baseline: 10/10 at worst Goal status: INITIAL    LONG TERM GOALS: Target date: 07/22/2022   Pt will improve FOTO function score to no less than 44% as proxy for functional improvement Baseline: 34% function Goal status: INITIAL    2.  Pt will increase 30 Second Sit to Stand rep count to no less than 8 reps for improved balance, strength, and functional mobility Baseline: 5 reps  Goal status: INITIAL    3.  Pt will improve 2MWT distance to no less than 248f for improved safety and endurance with home and community navigation Baseline: 1956fGoal status: INITIAL   4.  Pt will be able to stand greater than 15 minutes without increase in LBP in order to more comfortably [employer] breakfast and other home ADLS Baseline: unable Goal status: INITIAL   PLAN:   PT FREQUENCY: 2x/week   PT DURATION: 8 weeks   PLANNED INTERVENTIONS: Therapeutic exercises, Therapeutic  activity, Neuromuscular re-education, Balance training, Gait training, Patient/Family education, Self Care, Joint mobilization, Aquatic Therapy, Dry Needling, Electrical stimulation, Cryotherapy, Moist heat, Manual therapy, and Re-evaluation   PLAN FOR NEXT SESSION: assess HEP response, general strengthening, improving standing and functional activity tolerance    DaWard ChattersPT 06/30/2022, 9:06 AM

## 2022-06-30 NOTE — Telephone Encounter (Signed)
H&V Care Navigation CSW Progress Note  Clinical Social Worker called pt to inform of appt tomorrow.  Pt didn't seem aware of appt but states he can come and has a way to get here.  CSW sent him text with appt details included.  Patient is participating in a Managed Medicaid Plan:  Yes  [place]: No Food Insecurity (07/15/2021)  Housing: Low Risk  (07/15/2021)  Transportation Needs: No Transportation Needs (07/15/2021)  Depression (PHQ2-9): Low Risk  (06/26/2020)  Financial Resource Strain: High Risk (07/15/2021)  Tobacco Use: Medium Risk (06/07/2022)   Jorge Ny, [facility] Desk#: [phone] Cell#: [phone]

## 2022-07-01 ENCOUNTER — Ambulatory Visit (HOSPITAL_COMMUNITY)
Admission: RE | Admit: 2022-07-01 | Discharge: 2022-07-01 | Disposition: A | Discharge status: Home / Self Care | Payer: Medicaid Other | Source: Ambulatory Visit | Attending: Cardiology | Admitting: Cardiology

## 2022-07-01 ENCOUNTER — Encounter (HOSPITAL_COMMUNITY): Payer: Self-pay

## 2022-07-01 VITALS — BP 104/80 | HR 93 | Ht 72.0 in | Wt 351.0 lb

## 2022-07-01 DIAGNOSIS — T783XXA Angioneurotic edema, initial encounter: Secondary | ICD-10-CM | POA: Insufficient documentation

## 2022-07-01 DIAGNOSIS — I5032 Chronic diastolic (congestive) heart failure: Secondary | ICD-10-CM

## 2022-07-01 DIAGNOSIS — X58XXXA Exposure to other specified factors, initial encounter: Secondary | ICD-10-CM | POA: Insufficient documentation

## 2022-07-01 DIAGNOSIS — Z6841 Body Mass Index (BMI) 40.0 and over, adult: Secondary | ICD-10-CM | POA: Insufficient documentation

## 2022-07-01 DIAGNOSIS — Z7901 Long term (current) use of anticoagulants: Secondary | ICD-10-CM | POA: Diagnosis not present

## 2022-07-01 DIAGNOSIS — E785 Hyperlipidemia, unspecified: Secondary | ICD-10-CM | POA: Diagnosis not present

## 2022-07-01 DIAGNOSIS — Z79899 Other long term (current) drug therapy: Secondary | ICD-10-CM | POA: Diagnosis not present

## 2022-07-01 DIAGNOSIS — Z87891 Personal history of nicotine dependence: Secondary | ICD-10-CM | POA: Insufficient documentation

## 2022-07-01 DIAGNOSIS — I480 Paroxysmal atrial fibrillation: Secondary | ICD-10-CM | POA: Diagnosis not present

## 2022-07-01 DIAGNOSIS — N1831 Chronic kidney disease, stage 3a: Secondary | ICD-10-CM | POA: Diagnosis not present

## 2022-07-01 DIAGNOSIS — G4733 Obstructive sleep apnea (adult) (pediatric): Secondary | ICD-10-CM | POA: Insufficient documentation

## 2022-07-01 DIAGNOSIS — I5033 Acute on chronic diastolic (congestive) heart failure: Secondary | ICD-10-CM | POA: Diagnosis not present

## 2022-07-01 DIAGNOSIS — I130 Hypertensive heart and chronic kidney disease with heart failure and stage 1 through stage 4 chronic kidney disease, or unspecified chronic kidney disease: Secondary | ICD-10-CM | POA: Insufficient documentation

## 2022-07-01 LAB — BASIC METABOLIC PANEL
Anion gap: 11 (ref 5–15)
BUN: 14 mg/dL (ref 8–23)
CO2: 32 mmol/L (ref 22–32)
Calcium: 9.3 mg/dL (ref 8.9–10.3)
Chloride: 96 mmol/L — ABNORMAL LOW (ref 98–111)
Creatinine, Ser: 1.19 mg/dL (ref 0.61–1.24)
GFR, Estimated: >60 mL/min (ref >60)
Glucose, Bld: 91 mg/dL (ref 70–99)
Potassium: 5.1 mmol/L (ref 3.5–5.1)
Sodium: 139 mmol/L (ref 135–145)

## 2022-07-01 LAB — BRAIN NATRIURETIC PEPTIDE: B Natriuretic Peptide: 18.2 pg/mL (ref 0.0–100.0)

## 2022-07-01 NOTE — Patient Instructions (Signed)
Labs today - will call if abnormal No change in medications. Return in 4 weeks to [facility] with echo. Call if any questions [phone]

## 2022-07-01 NOTE — Progress Notes (Addendum)
ADVANCED HF CLINIC NOTE  Primary Care: McVey, Gelene Mink, PA-C Primary Cardiologist: Dr. Sallyanne Kuster HF Cardiologist: Dr. Aundra Dubin  HPI: Zachary Hawkins is a 67 y.o. with a h/o HFpEF, OSA, CPAP, smoker, CKD Stage III, HTN, obesity,and PAF. Has had multiple ED/hospital visits over the last year.  Ongoing compliance issues.   Echo 12/22 EF 60-65%, RV ok.    Admitted 03/2022 with A/C hypoxic/hypercarbic respiratory failure and volume overload. Treated with antibiotics steroids, nebs and diuretics.    Admitted 04/29/22 with increased shortness of breath in the setting of HF exacerbation. Diuresed with IV lasix and transitioned to lasix 80 mg twice a day   Evaluated in the ED 05/2021 with COPD exacerbation. Treated with nebulizers and steroids.    Admitted 06/23/21 with A/C respiratory failure and heart exacerbation. Placed on Bipap with improvement. Diuresed with IV lasix and transitioned to torsemide 100 mg daily. D/C weigth 344 pounds. Discharged 06/26/21 .   Seen in [facility] 3/23, volume up, likely in setting of high salt intake. Instructed to take extra torsemide 50 mg daily.   Admitted 07/17/21 with a/c CHF, diuresed with IV lasix. Discharged home, weight 338 lbs.  Post hospital follow up 3/23, NYHA III, volume OK on exam.   Admitted 4/23 with acute on chronic respiratory failure 2/2 CPAP noncompliance and unintentional pain medication overdose. Failed NIV and required intubation. CHF compensated.  Admitted 5/23 with SOB and AKI. GDMT held. Weight down to 325 lbs at discharge, new SCr baseline 1.5. Amlodipine, torsemide and spiro stopped at discharge. Saw general cardiology at follow up and torsemide restarted at 60 mg daily and spiro at 25 mg daily.  Admitted 12/26 -05/08/23 with A/C respiratory failure and A/C HFpEF. Diuresed with IV lasix and transitioned to torsemide 80 mg daily.   He presents today for routine f/u. In [place]. Has difficulty getting around, combination of dyspnea, obesity and  deconditioning. Sedentary lifestyle. Continues on chronic home O2 and currently w/o portable oxygen tank. Reports compliance w/ medications but he is not certain what he is taking (has pill packs). He admits to dietary indiscretion w/ high sodium/ processed foods. His wt is up 20 lb in the last 3 month but hard to tell how much is edema vs caloric.   EKG shows NSR 90 bpm. BP 104/80.    Labs (6/23): K 4.7, creatinine 1.35 Labs (7/23): BNP 19.5, K 4.6, creatinine 1.58 Labs (9/23): LDL 52, HDL 47, TGs 213 Labs (10/23): K 4.5, creatinine 1.66 Labs (05/08/22): Creatinine 1.14 K 4.2    PMH: 1. CKD stage 3 2. OSA: Uses CPAP.  3. HTN 4. Atrial fibrillation: Paroxysmal.  5. COPD: Prior smoker, quit in early 2023.  - Uses 2 L home oxygen.  6. Erectile dysfunction 7. Chronic diastolic CHF: Echo ([id number]) with EF 60-65%, normal RV, normal IVC.  8. Hyperlipidemia  ROS: All systems reviewed and negative except as per HPI.   Current Outpatient Medications  Medication Sig Dispense Refill   albuterol (PROVENTIL) (2.5 MG/3ML) 0.083% nebulizer solution Take 3 mLs (2.5 mg total) by nebulization every 6 (six) hours as needed for wheezing or shortness of breath. 75 mL 5   albuterol (VENTOLIN HFA) 108 (90 Base) MCG/ACT inhaler Inhale 2 puffs into the lungs every 4 (four) hours as needed for wheezing or shortness of breath. 18 g 0   apixaban (ELIQUIS) 5 MG TABS tablet Take 1 tablet (5 mg total) by mouth 2 (two) times daily. 60 tablet 11   atorvastatin (LIPITOR) 10 MG  tablet Take 1 tablet (10 mg total) by mouth daily. 90 tablet 3   bisoprolol (ZEBETA) 5 MG tablet Take 0.5 tablets (2.5 mg total) by mouth daily. 45 tablet 3   dapagliflozin propanediol (FARXIGA) 10 MG TABS tablet Take 1 tablet (10 mg total) by mouth daily. 30 tablet 11   famotidine (PEPCID) 20 MG tablet Take 1 tablet (20 mg total) by mouth 2 (two) times daily. 28 tablet 0   fluticasone (FLONASE) 50 MCG/ACT nasal spray PLACE 1 SPRAY INTO BOTH  NOSTRILS DAILY. 16 g 11   fluticasone-salmeterol (ADVAIR HFA) 115-21 MCG/ACT inhaler Inhale 2 puffs into the lungs 2 (two) times daily.     mupirocin ointment (BACTROBAN) 2 % Place 1 application. into the nose 2 (two) times daily. (Patient taking differently: Place 1 application  into the nose daily as needed.) 22 g 0   pantoprazole (PROTONIX) 40 MG tablet Take 1 tablet (40 mg total) by mouth daily. 30 tablet 0   saline (AYR) GEL Place 1 application into both nostrils at bedtime. (Patient taking differently: Place 1 application  into both nostrils at bedtime as needed (for congestion).) 14.1 g 11   spironolactone (ALDACTONE) 25 MG tablet Take 1 tablet (25 mg total) by mouth daily. 90 tablet 3   tamsulosin (FLOMAX) 0.4 MG CAPS capsule Take 1 capsule (0.4 mg total) by mouth daily after supper. 30 capsule 2   Tiotropium Bromide Monohydrate (SPIRIVA RESPIMAT) 2.5 MCG/ACT AERS Inhale 2 puffs into the lungs daily. 1 g 5   torsemide (DEMADEX) 20 MG tablet Patient takes 4 tablets by mouth daily.     EPINEPHrine 0.3 mg/0.3 mL IJ SOAJ injection Inject 0.3 mg into the muscle as needed for anaphylaxis. (Patient not taking: Reported on 05/27/2022) 1 each 1   polyethylene glycol (MIRALAX / GLYCOLAX) 17 g packet Take 17 g by mouth 2 (two) times daily. (Patient not taking: Reported on 07/01/2022) 14 each 0   No current facility-administered medications for this encounter.   Allergies  Allergen Reactions   Ace Inhibitors Anaphylaxis   Black Cherry Fruit Extract Marcelline Mates Extract] Anaphylaxis    Tongue   Grenadine Flavor [Flavoring Agent] Anaphylaxis   Social History   Socioeconomic History   Marital status: Single    Spouse name: Not on file   Number of children: Not on file   Years of education: Not on file   Highest education level: Not on file  Occupational History   Not on file  Tobacco Use   Smoking status: Former    Packs/day: 1.00    Years: 36.00    Total pack years: 36.00    Types: Cigarettes     Quit date: 01/08/2021    Years since quitting: 1.4   Smokeless tobacco: Never  Vaping Use   Vaping Use: Never used  Substance and Sexual Activity   Alcohol use: Yes    Alcohol/week: 1.0 standard drink of alcohol    Types: 1 Shots of liquor per week    Comment: socially   Drug use: Yes    Types: Marijuana    Comment: every other week   Sexual activity: Not on file  Other Topics Concern   Not on file  Social History Narrative   Not on file   Social Determinants of Health   Financial Resource Strain: High Risk (07/15/2021)   Overall Financial Resource Strain (CARDIA)    Difficulty of Paying Living Expenses: Very hard  Food Insecurity: No Food Insecurity (07/15/2021)   Hunger Vital  Sign    Worried About Charity fundraiser in the Last Year: Never true    [place] in the Last Year: Never true  Transportation Needs: No Transportation Needs (07/15/2021)   PRAPARE - Hydrologist (Medical): No    Lack of Transportation (Non-Medical): No  Physical Activity: Not on file  Stress: Not on file  Social Connections: Not on file  Intimate Partner Violence: Not on file   Family History  Problem Relation Age of Onset   Diabetes Mother    Diabetes Father    BP 104/80   Pulse 93   Ht 6' (1.829 m)   Wt (!) 159.2 kg (351 lb)   SpO2 94%   BMI 47.60 kg/m   Wt Readings from Last 3 Encounters:  07/01/22 (!) 159.2 kg (351 lb)  05/27/22 (!) 155.6 kg (343 lb)  05/08/22 (!) 150.2 kg (331 lb 1.6 oz)   PHYSICAL EXAM: General:  super morbidly obese. Using [place], no increased WOB  HEENT: normal Neck: supple. Thick neck, JVD not well visualized. Carotids 2+ bilat; no bruits. No lymphadenopathy or thryomegaly appreciated. Cor: PMI nondisplaced. Regular rate & rhythm. No rubs, gallops or murmurs. Lungs: clear Abdomen: obese/distended, soft, nontender, No hepatosplenomegaly. No bruits or masses. Good bowel sounds. Extremities: no cyanosis, clubbing, rash,  edema Neuro: alert & orientedx3, cranial nerves grossly intact. moves all 4 extremities w/o difficulty. Affect pleasant   ASSESSMENT & PLAN: 1. Chronic diastolic heart failure: Echo in 12/22 with EF 60-65%, RV normal. Multiple admissions for CHF in 2023.   - NYHA III, counfounded by super morbid obesity, deconditioning, COPD and suspected OHS - Wt is up 20 lb over the last 3 months but uncertain how much wt gain is edema from CHF vs caloric. Volume assessment difficult given body habitus/ obesity. Will check BNP and BMP. Suspect he may need twice daily torsemide pending BNP level  - update echo, suspect some degree of RV dysfunction/likely OHS/ possible pulmonary HTN  - May be beneficial to do a RHC in the future - Continue torsemide 80 mg daily. Possible up titration pending BNP  - Continue bisoprolol 2.5 mg daily. BP too soft for titration  - Continue Farxiga 10 mg daily. - Continue spironolactone 25 mg daily. - Angioedema with ACEI so not a candidate for ARNI.  - ? Cardiomems, but he has Medicaid => he would be interested, may be able to get this when he goes on Medicare. Also may not be a suitable candidate given chest circumference  2. Atrial fibrillation: Paroxysmal. NSR on EKG today   - Continue bisoprolol.  - Continue Eliquis 5 mg bid. No bleeding issues. Check CBC today  3. OSA: Reinforced compliance w/ Bipap.  4. COPD: Prior smoker.  Uses home oxygen.  - followed by pulmonology, Dr. Verlee Monte  5. Obesity: Body mass index is 47.6 kg/m. - He is now on Ozempic 0.25 mg daily, just started. - discussed smaller food portions  6. CKD Stage IIIa: Baseline around 1.3-1.6.  - Continue SGLT2i. denies GU symptoms - check BMP today  7. Hyperlipidemia: Good lipids 9/23.    Follow up w/ APP in 4 wks   Zachary Jester, PA-C   07/01/2022

## 2022-07-06 NOTE — Progress Notes (Unsigned)
Synopsis: Referred for decreased O2 saturation, restrictive lung disease, dyspnea by Desiree Lucy*  Subjective:   PATIENT ID: Zachary Hawkins GENDER: male DOB: 1956-03-28, MRN: [id number]  No chief complaint on file.  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 He was referred to allergy/immunology.   He has some DOE. Taking advair one puff BID. No cough especially since stopping smoking.   He never had any itching during either of the above episodes. Tryptase was negative.   He is finishing course of steroids.   Snores. Some PND. Some daytime sleepiness.   He had an episode yesterday where home oxygen cut off and it worried him.   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   Interval HPI  Since last visit hospitalized twice for acute on chronic hypercapnic hypoxic respiratory failure requiring NIV, intubation in April. Discharged on torsemide 80 daily, spironolactone 25 mg daily, SGLT2i.  He doesn't really have cough.   He thinks he's doing well since leaving hospital. Weight is back up to 346 lb however. Down to 325 lb at end of last hospitalization. Takes all of his medicines in blister pack which pharmacy arranges. Home health nurse is helping him with adherence.   He says he's using trelegy most nights.   Seen by CHF 07/01/22 - no change, weight was up 20 lb but unclear if fluid retention vs increased caloric intake  At last ov with TP continued advair and started spiriva, encouraged to restart using  trilogy vent  No bothersome cough. Doesn't feel like his DOE is any worse than at last visit.   Asks for POC walk today.   Trilogy taken away 2 months ago due to inconsistent use  Otherwise pertinent review of systems is negative.  Past Medical History:  Diagnosis Date   Asthma    BPH (benign prostatic hyperplasia)    Cardiomyopathy ([place]) 06/2017   EF 40-45%   CHF (congestive heart failure) (HCC)    Chronic renal insufficiency, stage 3 (moderate) (HCC)    COPD (chronic obstructive pulmonary disease) (HCC)    Diastolic dysfunction [id number]   grade 2    Hyperlipidemia    Hypertension    Hypertensive urgency    Obesity    Obesity    OSA (obstructive sleep apnea)    Paroxysmal atrial fibrillation ([place]) 2022   on Eliquis     Family History  Problem Relation Age of Onset   Diabetes Mother    Diabetes Father      No past surgical history on file.  Social History   Socioeconomic History   Marital status: Single    Spouse name: Not on file   Number of children: Not on file   Years of education: Not on file   Highest education level: Not on file  Occupational History   Not on file  Tobacco Use   Smoking status: Former    Packs/day: 1.00    Years: 36.00    Total pack years: 36.00    Types: Cigarettes    Quit date:  01/08/2021    Years since quitting: 1.4   Smokeless tobacco: Never  Vaping Use   Vaping Use: Never used  Substance and Sexual Activity   Alcohol use: Yes    Alcohol/week: 1.0 standard drink of alcohol    Types: 1 Shots of liquor per week    Comment: socially   Drug use: Yes    Types: Marijuana    Comment: every other week   Sexual activity: Not on file  Other Topics Concern   Not on file  Social History Narrative   Not on file   Social Determinants of Health   Financial Resource Strain: High Risk (07/15/2021)   Overall Financial Resource Strain (CARDIA)    Difficulty of Paying Living Expenses: Very hard  Food Insecurity: No Food Insecurity  (07/15/2021)   Hunger Vital Sign    Worried About Running Out of Food in the Last Year: Never true    Ran Out of Food in the Last Year: Never true  Transportation Needs: No Transportation Needs (07/15/2021)   PRAPARE - Hydrologist (Medical): No    Lack of Transportation (Non-Medical): No  Physical Activity: Not on file  Stress: Not on file  Social Connections: Not on file  Intimate Partner Violence: Not on file     Allergies  Allergen Reactions   Ace Inhibitors Anaphylaxis   Black Cherry Fruit Extract Marcelline Mates Extract] Anaphylaxis    Tongue   Grenadine Flavor [Flavoring Agent] Anaphylaxis     Outpatient Medications Prior to Visit  Medication Sig Dispense Refill   albuterol (PROVENTIL) (2.5 MG/3ML) 0.083% nebulizer solution Take 3 mLs (2.5 mg total) by nebulization every 6 (six) hours as needed for wheezing or shortness of breath. 75 mL 5   albuterol (VENTOLIN HFA) 108 (90 Base) MCG/ACT inhaler Inhale 2 puffs into the lungs every 4 (four) hours as needed for wheezing or shortness of breath. 18 g 0   apixaban (ELIQUIS) 5 MG TABS tablet Take 1 tablet (5 mg total) by mouth 2 (two) times daily. 60 tablet 11   atorvastatin (LIPITOR) 10 MG tablet Take 1 tablet (10 mg total) by mouth daily. 90 tablet 3   bisoprolol (ZEBETA) 5 MG tablet Take 0.5 tablets (2.5 mg total) by mouth daily. 45 tablet 3   dapagliflozin propanediol (FARXIGA) 10 MG TABS tablet Take 1 tablet (10 mg total) by mouth daily. 30 tablet 11   famotidine (PEPCID) 20 MG tablet Take 1 tablet (20 mg total) by mouth 2 (two) times daily. 28 tablet 0   fluticasone (FLONASE) 50 MCG/ACT nasal spray PLACE 1 SPRAY INTO BOTH NOSTRILS DAILY. 16 g 11   fluticasone-salmeterol (ADVAIR HFA) 115-21 MCG/ACT inhaler Inhale 2 puffs into the lungs 2 (two) times daily.     mupirocin ointment (BACTROBAN) 2 % Place 1 application. into the nose 2 (two) times daily. (Patient taking differently: Place 1 application  into the nose  daily as needed.) 22 g 0   pantoprazole (PROTONIX) 40 MG tablet Take 1 tablet (40 mg total) by mouth daily. 30 tablet 0   saline (AYR) GEL Place 1 application into both nostrils at bedtime. (Patient taking differently: Place 1 application  into both nostrils at bedtime as needed (for congestion).) 14.1 g 11   spironolactone (ALDACTONE) 25 MG tablet Take 1 tablet (25 mg total) by mouth daily. 90 tablet 3   tamsulosin (FLOMAX) 0.4 MG CAPS capsule Take 1 capsule (0.4 mg total) by mouth daily after supper. 30 capsule 2  Tiotropium Bromide Monohydrate (SPIRIVA RESPIMAT) 2.5 MCG/ACT AERS Inhale 2 puffs into the lungs daily. 1 g 5   torsemide (DEMADEX) 20 MG tablet Patient takes 4 tablets by mouth daily.     EPINEPHrine 0.3 mg/0.3 mL IJ SOAJ injection Inject 0.3 mg into the muscle as needed for anaphylaxis. (Patient not taking: Reported on 05/27/2022) 1 each 1   polyethylene glycol (MIRALAX / GLYCOLAX) 17 g packet Take 17 g by mouth 2 (two) times daily. (Patient not taking: Reported on 07/01/2022) 14 each 0   No facility-administered medications prior to visit.       Objective:   Physical Exam:  General appearance: 67 y.o., male, NAD, conversant  Eyes: anicteric sclerae; PERRL, tracking appropriately HENT: NCAT; MMM Neck: Trachea midline; no lymphadenopathy, no JVD Lungs: distant, no crackles, no wheeze, with normal respiratory effort on 2L O2 CV: RRR, no murmur  Abdomen: Soft, non-tender; non-distended, BS present  Extremities: trace peripheral edema, warm Skin: Normal turgor and texture; no rash Psych: Appropriate affect Neuro: Alert and oriented to person and place, no focal deficit     Vitals:   07/08/22 1456  BP: 126/64  Pulse: 97  Temp: 98.1 F (36.7 C)  TempSrc: Oral  SpO2: 96%  Weight: (!) 349 lb (158.3 kg)  Height: 6' (1.829 m)        96% on 2L BMI Readings from Last 3 Encounters:  07/08/22 47.33 kg/m  07/01/22 47.60 kg/m  05/27/22 45.25 kg/m   Wt Readings  from Last 3 Encounters:  07/08/22 (!) 349 lb (158.3 kg)  07/01/22 (!) 351 lb (159.2 kg)  05/27/22 (!) 343 lb (155.6 kg)     CBC    Component Value Date/Time   WBC 7.7 05/07/2022 0206   RBC 4.94 05/07/2022 0206   HGB 13.1 05/07/2022 0206   HGB 16.7 07/11/2020 1103   HCT 45.8 05/07/2022 0206   HCT 50.3 07/11/2020 1103   PLT 229 05/07/2022 0206   PLT 232 07/11/2020 1103   MCV 92.7 05/07/2022 0206   MCV 88 07/11/2020 1103   MCH 26.5 05/07/2022 0206   MCHC 28.6 (L) 05/07/2022 0206   RDW 17.8 (H) 05/07/2022 0206   RDW 13.6 07/11/2020 1103   LYMPHSABS 0.6 (L) 05/04/2022 1300   MONOABS 0.9 05/04/2022 1300   EOSABS 0.3 05/04/2022 1300   BASOSABS 0.0 05/04/2022 1300   C4 31 IgE 394 C1 inh above normal  Tryptase WNL  Chest Imaging: CXR 01/13/21 reviewed by me and unremarkable  CT A/P 04/29/21 lung bases reviewed by with RLL scar and lower lobe ?cysts   CTA Chest 05/11/21 reviewed by me with bronchial wall thickening, stable scar  CXR 08/17/21 reviewed by me with cardiomegaly, likely RLL atelectasis, possible retrocardiac opacity  CXR 05/27/22 stable relative to prior  Pulmonary Functions Testing Results:    Latest Ref Rng & Units 12/23/2021    1:24 PM  PFT Results  DLCO uncorrected ml/min/mmHg 17.79  P  DLCO UNC% % 60  P  DLCO corrected ml/min/mmHg 17.79  P  DLCO COR %Predicted % 60  P  DLVA Predicted % 129  P  TLC L 4.55  P  TLC % Predicted % 59  P  RV % Predicted % 66  P    P Preliminary result   PFT 12/23/21 reviewed by me with restriction, mildly reduced diffusing capacity with elevated KCO, unable to perform spirometry    Echocardiogram:   TTE 01/08/21 EF 60-65%, G1DD  TTE 04/30/21 essentially normal  Assessment & Plan:   # Dyspnea on exertion: # OSA # HFpEF # Chronic hypoxic hypercapnic respiratory failure May be multifactorial from deconditioning, diastolic dysfunction (but looks well compensated today), undertreated sleep-disordered breathing,  possible asthma/copd though has been unable to perform spirometry. Doing better than at last visit however. Near last dry weight per CHF note (~340-345 lb). Walked for POC today, will send prescription for POC at 4L with exertion, 2L O2 at rest and will need 4L O2 with sleep.  # Angioedema: possible that it is still related to ACEi even years from taking it. Not sure what to make of the elevated C1inh level.  # Smoking: Congratulated him on cessation. Does not qualify for lung cancer screening with <20 py smoking.  Plan: - Would stay on same dose of torsemide, aldactone, farxiga - Would start breztri 2 puffs twice daily EVERYDAY to simplify bronchodilator regimen, rinse mouth and brush tongue/teeth after each use - stop advair, spiriva - albuterol 1-2 puffs as needed up to 4-6 times daily - this is your RESCUE inhaler - will reach out to [employer] about your trilogy vent to verify lapses in use coincide with admissions. Will also request POC as above - weight loss encouraged - See you in 3 months or sooner if need be!  RTC 3 months      Maryjane Hurter, MD [facility] 07/08/2022 3:10 PM

## 2022-07-08 ENCOUNTER — Encounter: Payer: Self-pay | Admitting: Student

## 2022-07-08 ENCOUNTER — Ambulatory Visit (INDEPENDENT_AMBULATORY_CARE_PROVIDER_SITE_OTHER): Payer: Medicaid Other | Admitting: Student

## 2022-07-08 VITALS — BP 126/64 | HR 97 | Temp 98.1°F | Ht 72.0 in | Wt 349.0 lb

## 2022-07-08 DIAGNOSIS — J449 Chronic obstructive pulmonary disease, unspecified: Secondary | ICD-10-CM | POA: Diagnosis not present

## 2022-07-08 DIAGNOSIS — R0609 Other forms of dyspnea: Secondary | ICD-10-CM

## 2022-07-08 DIAGNOSIS — J9611 Chronic respiratory failure with hypoxia: Secondary | ICD-10-CM | POA: Diagnosis not present

## 2022-07-08 MED ORDER — BREZTRI AEROSPHERE 160-9-4.8 MCG/ACT IN AERO: 2.0000 | INHALATION_SPRAY | Freq: Two times a day (BID) | RESPIRATORY_TRACT | 0 refills | Status: DC

## 2022-07-08 MED ORDER — BREZTRI AEROSPHERE 160-9-4.8 MCG/ACT IN AERO: 2.0000 | INHALATION_SPRAY | Freq: Two times a day (BID) | RESPIRATORY_TRACT | 11 refills | Status: DC

## 2022-07-08 NOTE — Patient Instructions (Addendum)
-   Would stay on same dose of torsemide, aldactone, farxiga - Would start breztri 2 puffs twice daily EVERYDAY, rinse mouth and brush tongue/teeth after each use - stop advair, spiriva - albuterol 1-2 puffs as needed up to 4-6 times daily - this is your RESCUE inhaler - will reach out to [employer] about your trilogy vent - See you in 3 months or sooner if need be!

## 2022-07-08 NOTE — Addendum Note (Signed)
Addended by: Fran Lowes on: 07/08/2022 04:22 PM   Modules accepted: Orders

## 2022-07-09 ENCOUNTER — Telehealth: Payer: Self-pay

## 2022-07-09 NOTE — Telephone Encounter (Signed)
PA request received via CMM for Breztri Aerosphere 160-9-4.8MCG/ACT aerosol  PA submitted to [employer] and APPROVED from 07/09/2022-07/09/2023  Key: [id number]

## 2022-07-12 ENCOUNTER — Encounter: Payer: Self-pay | Admitting: Sports Medicine

## 2022-07-12 ENCOUNTER — Ambulatory Visit (INDEPENDENT_AMBULATORY_CARE_PROVIDER_SITE_OTHER): Payer: Medicaid Other

## 2022-07-12 ENCOUNTER — Ambulatory Visit (INDEPENDENT_AMBULATORY_CARE_PROVIDER_SITE_OTHER): Payer: Medicaid Other | Admitting: Sports Medicine

## 2022-07-12 DIAGNOSIS — M25511 Pain in right shoulder: Secondary | ICD-10-CM

## 2022-07-12 DIAGNOSIS — M19011 Primary osteoarthritis, right shoulder: Secondary | ICD-10-CM | POA: Diagnosis not present

## 2022-07-12 DIAGNOSIS — G8929 Other chronic pain: Secondary | ICD-10-CM

## 2022-07-12 NOTE — Progress Notes (Addendum)
Right shoulder pain for 2 months He states it started after a covid/flu shot combo Limited ROM Has tried some sort of "cream" on that shoulder that helps some  Has not tried anything for treatment.  Took whatever left over pain pills he had from another procedure for pain, but states he is out.  Patient was instructed in 10 minutes of therapeutic exercises for right shoulder pain to improve strength, ROM and function according to my instructions and plan of care by a Certified Athletic Trainer during the office visit. A customized handout was provided and demonstration of proper technique shown and discussed. Patient did perform exercises and demonstrate understanding through teachback.  All questions discussed and answered.

## 2022-07-12 NOTE — Progress Notes (Addendum)
Zachary Hawkins - 67 y.o. male MRN [id number]  Date of birth: 03-26-1956  Office Visit Note: Visit Date: 07/12/2022 PCP: Dorise Hiss, PA-C Referred by: Zachary Hawkins*  Subjective: Chief Complaint  Patient presents with   Right Shoulder - Pain   HPI: Zachary Hawkins is a pleasant 67 y.o. male who presents today for right shoulder pain x 2 months.   He has had right shoulder pain for about 2 months.  He states just a few days before the onset he had a combination of a COVID and influenza vaccine into that shoulder.  Does report some pain and stiffness with certain reaching motions.  His primary care doctor did give him an over-the-counter topical cream, this sounds like Voltaren, has been states that helped some of his pain.  He has some pain lying on that side of the shoulder.  Denies any redness or swelling.  He denies any radicular symptoms or numbness tingling, but does state his pain will travel down the bicep and the lateral arm at times.  He is a type-II diabetic. Last A1c was 7.2. Managed on Farxiga '10mg'$ ; also reports he is on Ozempic? Lab Results  Component Value Date   HGBA1C 7.2 (H) 05/04/2022   Pertinent ROS were reviewed with the patient and found to be negative unless otherwise specified above in HPI.   Assessment & Plan: Visit Diagnoses:  1. Chronic right shoulder pain   2. Primary osteoarthritis, right shoulder    Plan: Discussed with Evette Doffing the nature of his right shoulder pain.  I do think his pain may be multifactorial in nature, he does have some mild glenohumeral joint arthritis, however his pain started after he received both a COVID and influenza intramuscular injection to the right shoulder.  It is possible he has a shoulder injury related to vaccine administration (SIRVA).  We discussed all treatment options such as oral medication therapy, topical therapy, rehab versus home PT, injection therapy.  Through shared decision-making, he elected  to proceed with home rehab protocol, my athletic trainer, Lilia Pro did review his custom printed out handout for him to perform once daily at home.  He will continue his topical medication, may also use Voltaren gel.  I would like to see how he is doing in 6 weeks after doing the home therapy.  Future considerations may be considering injection if not improving with time, PT, Ice (SAJ vs. GHJ vs. AC).  Follow-up: Return in about 6 weeks (around 08/23/2022) for for right shoulder pain.   Meds & Orders: No orders of the defined types were placed in this encounter.   Orders Placed This Encounter  Procedures   XR Shoulder Right     Procedures: No procedures performed      Clinical History: No specialty comments available.  He reports that he quit smoking about 18 months ago. His smoking use included cigarettes. He has a 36.00 pack-year smoking history. He has never used smokeless tobacco.  Recent Labs    05/04/22 1513  HGBA1C 7.2*    Objective:    Physical Exam  Gen: Well-appearing, in no acute distress; non-toxic CV: Regular Rate. Well-perfused. Warm.  Resp: Breathing unlabored on room air; no wheezing. Psych: Fluid speech in conversation; appropriate affect; normal thought process Neuro: Sensation intact throughout. No gross coordination deficits.   Ortho Exam - Right shoulder: Inspection of the right shoulder demonstrates no redness or swelling.  There is positive TTP noted over the [facility] joint.  There is  equivocal active and passive range of motion in all directions other than reaching the right shoulder to the contralateral left.  Positive painful drop arm test.  Positive scarf test.  No weakness or pain with resisted internal and external rotation. Mildly + speed's test.  Imaging: XR Shoulder Right  Result Date: 07/12/2022 4 views of the right shoulder including AP, Grashey, scapular Y and axillary views were ordered and reviewed by myself.  X-rays demonstrate mild glenohumeral  joint arthritis.  There is some mild calcification within the [facility] joint without significant narrowing, possible for CPPD.  No acute fracture or other bony abnormality noted.   Past Medical/Family/Surgical/Social History: Medications & Allergies reviewed per EMR, new medications updated. Patient Active Problem List   Diagnosis Date Noted   Stage 3a chronic kidney disease (CKD) ([place]) 05/06/2022   ARF (acute renal failure) ([place]) 10/07/2021   SIRS (systemic inflammatory response syndrome) ([place]) 99991111   Acute metabolic encephalopathy    Chronic diastolic heart failure (HCC)    Chronic obstructive pulmonary disease with acute exacerbation (HCC)    Hyperkalemia    Class 3 obesity ([place]) 07/18/2021   Acute on chronic diastolic heart failure (HCC) 07/17/2021   BPH (benign prostatic hyperplasia)    Hyperlipidemia    Acute on chronic respiratory failure with hypoxia and hypercapnia (HCC) 06/23/2021   Chronic diastolic CHF (congestive heart failure) ([place]) 06/23/2021   Acute on chronic diastolic (congestive) heart failure ([place]) 04/30/2021   AKI (acute kidney injury) ([place]) 04/29/2021   Chronic respiratory failure with hypercapnia ([place]) 04/29/2021   Bilateral hydrocele 04/29/2021   Dyspnea on exertion 04/29/2021   Acute respiratory failure with hypoxia (HCC) 03/10/2021   Urticaria 01/15/2021   Chronic heart failure with preserved ejection fraction (HCC)    Angioedema 01/04/2021   Influenza vaccine refused 06/26/2020   COVID-19 vaccine series completed 06/26/2020   Paroxysmal atrial fibrillation ([place]) 07/02/2019   Secondary hypercoagulable state ([place]) 07/02/2019   OSA (obstructive sleep apnea) 04/03/2019   History of angioedema due to ACE INHIBITORS 10/13/2017   Non compliance w medication regimen 07/13/2017   Essential hypertension 06/23/2017   Acute kidney injury superimposed on chronic kidney disease ([place])    Cardiomyopathy ([place]) 06/06/2017   Dyspnea 06/05/2017   Past Medical History:   Diagnosis Date   Asthma    BPH (benign prostatic hyperplasia)    Cardiomyopathy ([place]) 06/2017   EF 40-45%   CHF (congestive heart failure) (HCC)    Chronic renal insufficiency, stage 3 (moderate) (HCC)    COPD (chronic obstructive pulmonary disease) ([place])    Diastolic dysfunction [id number]   grade 2    Hyperlipidemia    Hypertension    Hypertensive urgency    Obesity    Obesity    OSA (obstructive sleep apnea)    Paroxysmal atrial fibrillation ([place]) 2022   on Eliquis   Family History  Problem Relation Age of Onset   Diabetes Mother    Diabetes Father    History reviewed. No pertinent surgical history. Social History   Occupational History   Not on file  Tobacco Use   Smoking status: Former    Packs/day: 1.00    Years: 36.00    Total pack years: 36.00    Types: Cigarettes    Quit date: 01/08/2021    Years since quitting: 1.5   Smokeless tobacco: Never  Vaping Use   Vaping Use: Never used  Substance and Sexual Activity   Alcohol use: Yes    Alcohol/week: 1.0 standard drink  of alcohol    Types: 1 Shots of liquor per week    Comment: socially   Drug use: Yes    Types: Marijuana    Comment: every other week   Sexual activity: Not on file

## 2022-08-03 ENCOUNTER — Encounter (HOSPITAL_COMMUNITY): Payer: Medicaid Other

## 2022-08-03 ENCOUNTER — Ambulatory Visit (HOSPITAL_COMMUNITY): Payer: Medicaid Other

## 2022-08-04 ENCOUNTER — Other Ambulatory Visit (HOSPITAL_COMMUNITY): Payer: Self-pay

## 2022-08-05 ENCOUNTER — Telehealth (HOSPITAL_COMMUNITY): Payer: Self-pay

## 2022-08-05 NOTE — Telephone Encounter (Signed)
[facility] called and stated patient has gained 8 lbs since 07/28/22 - 5lbs of that since 07/30/22. She stated he had diminished breath sounds. BP was 114/82, HR 100. His 02 sats were 95 on 4L of oxygen. She wanted to make sure we did not need to address further before the weekend. Please advise.

## 2022-08-05 NOTE — Telephone Encounter (Signed)
Attempted to reach patient. No answer and voice mail full

## 2022-08-06 NOTE — Telephone Encounter (Signed)
Attempted to reach. No answer and mailbox full

## 2022-08-19 ENCOUNTER — Ambulatory Visit (HOSPITAL_COMMUNITY)
Admission: RE | Admit: 2022-08-19 | Discharge: 2022-08-19 | Disposition: A | Discharge status: Home / Self Care | Payer: Medicaid Other | Source: Ambulatory Visit | Attending: Cardiology | Admitting: Cardiology

## 2022-08-19 ENCOUNTER — Encounter (HOSPITAL_COMMUNITY): Payer: Self-pay

## 2022-08-19 ENCOUNTER — Ambulatory Visit (HOSPITAL_BASED_OUTPATIENT_CLINIC_OR_DEPARTMENT_OTHER)
Admission: RE | Admit: 2022-08-19 | Discharge: 2022-08-19 | Disposition: A | Discharge status: Home / Self Care | Payer: Medicaid Other | Source: Ambulatory Visit | Attending: Cardiology | Admitting: Cardiology

## 2022-08-19 VITALS — BP 140/90 | HR 92 | Wt 347.6 lb

## 2022-08-19 DIAGNOSIS — Z7901 Long term (current) use of anticoagulants: Secondary | ICD-10-CM | POA: Diagnosis not present

## 2022-08-19 DIAGNOSIS — I10 Essential (primary) hypertension: Secondary | ICD-10-CM | POA: Diagnosis not present

## 2022-08-19 DIAGNOSIS — E785 Hyperlipidemia, unspecified: Secondary | ICD-10-CM | POA: Insufficient documentation

## 2022-08-19 DIAGNOSIS — Z87891 Personal history of nicotine dependence: Secondary | ICD-10-CM | POA: Diagnosis not present

## 2022-08-19 DIAGNOSIS — N1831 Chronic kidney disease, stage 3a: Secondary | ICD-10-CM | POA: Insufficient documentation

## 2022-08-19 DIAGNOSIS — Z79899 Other long term (current) drug therapy: Secondary | ICD-10-CM | POA: Insufficient documentation

## 2022-08-19 DIAGNOSIS — G4733 Obstructive sleep apnea (adult) (pediatric): Secondary | ICD-10-CM | POA: Insufficient documentation

## 2022-08-19 DIAGNOSIS — I480 Paroxysmal atrial fibrillation: Secondary | ICD-10-CM | POA: Diagnosis not present

## 2022-08-19 DIAGNOSIS — J441 Chronic obstructive pulmonary disease with (acute) exacerbation: Secondary | ICD-10-CM | POA: Insufficient documentation

## 2022-08-19 DIAGNOSIS — I130 Hypertensive heart and chronic kidney disease with heart failure and stage 1 through stage 4 chronic kidney disease, or unspecified chronic kidney disease: Secondary | ICD-10-CM | POA: Diagnosis not present

## 2022-08-19 DIAGNOSIS — I5032 Chronic diastolic (congestive) heart failure: Secondary | ICD-10-CM | POA: Diagnosis present

## 2022-08-19 DIAGNOSIS — Z7985 Long-term (current) use of injectable non-insulin antidiabetic drugs: Secondary | ICD-10-CM | POA: Insufficient documentation

## 2022-08-19 DIAGNOSIS — Z6841 Body Mass Index (BMI) 40.0 and over, adult: Secondary | ICD-10-CM | POA: Diagnosis not present

## 2022-08-19 DIAGNOSIS — I5033 Acute on chronic diastolic (congestive) heart failure: Secondary | ICD-10-CM | POA: Diagnosis not present

## 2022-08-19 DIAGNOSIS — Z5986 Financial insecurity: Secondary | ICD-10-CM | POA: Insufficient documentation

## 2022-08-19 LAB — ECHOCARDIOGRAM COMPLETE
AR max vel: 1.41 cm2
AV Area VTI: 1.37 cm2
AV Area mean vel: 1.38 cm2
AV Mean grad: 3 mmHg
AV Peak grad: 4.7 mmHg
Ao pk vel: 1.08 m/s
Area-P 1/2: 2.8 cm2
Calc EF: 48.2 %
S' Lateral: 3.2 cm
Single Plane A2C EF: 19.1 %
Single Plane A4C EF: 67.8 %

## 2022-08-19 LAB — BASIC METABOLIC PANEL
Anion gap: 8 (ref 5–15)
BUN: 16 mg/dL (ref 8–23)
CO2: 32 mmol/L (ref 22–32)
Calcium: 9.1 mg/dL (ref 8.9–10.3)
Chloride: 97 mmol/L — ABNORMAL LOW (ref 98–111)
Creatinine, Ser: 1.28 mg/dL — ABNORMAL HIGH (ref 0.61–1.24)
GFR, Estimated: >60 mL/min (ref >60)
Glucose, Bld: 99 mg/dL (ref 70–99)
Potassium: 4.5 mmol/L (ref 3.5–5.1)
Sodium: 137 mmol/L (ref 135–145)

## 2022-08-19 LAB — BRAIN NATRIURETIC PEPTIDE: B Natriuretic Peptide: 23.5 pg/mL (ref 0.0–100.0)

## 2022-08-19 MED ORDER — BISOPROLOL FUMARATE 5 MG PO TABS: 5.0000 mg | ORAL_TABLET | Freq: Every day | ORAL | 3 refills | Status: DC

## 2022-08-19 MED ORDER — AMLODIPINE BESYLATE 2.5 MG PO TABS: 2.5000 mg | ORAL_TABLET | Freq: Every day | ORAL | 3 refills | Status: AC

## 2022-08-19 MED ORDER — PERFLUTREN LIPID MICROSPHERE
1.0000 mL | INTRAVENOUS | Status: DC | PRN
  Administered 2022-08-19: 5 mL via INTRAVENOUS

## 2022-08-19 NOTE — Patient Instructions (Addendum)
Thank you for coming in today  Labs were done today, if any labs are abnormal the clinic will call you No news is good news  Medications: Increase Bisoprolol to 5 mg daily START Amlodipine 2.5 mg daily   Follow up appointments: 3 weeks with pharmacy  3 months in clinic  Your physician recommends that you schedule a follow-up appointment in:     Do the following things EVERYDAY: Weigh yourself in the morning before breakfast. Write it down and keep it in a log. Take your medicines as prescribed Eat low salt foods--Limit salt (sodium) to 2000 mg per day.  Stay as active as you can everyday Limit all fluids for the day to less than 2 liters   At the [facility], you and your health needs are our priority. As part of our continuing mission to provide you with exceptional heart care, we have created designated Provider Care Teams. These Care Teams include your primary Cardiologist (physician) and Advanced Practice Providers (APPs- Physician Assistants and Nurse Practitioners) who all work together to provide you with the care you need, when you need it.   You may see any of the following providers on your designated Care Team at your next follow up: Dr Arvilla Meres Dr Marca Ancona Dr. Marcos Eke, NP Robbie Lis, [place] [facility] Olivia Lopez de Gutierrez, [place] Brynda Peon, NP Karle Plumber, PharmD   Please be sure to bring in all your medications bottles to every appointment.    Thank you for choosing [facility]  If you have any questions or concerns before your next appointment please send [place] a message through [place] or call our office at [phone].    TO LEAVE A MESSAGE FOR THE NURSE SELECT OPTION 2, PLEASE LEAVE A MESSAGE INCLUDING: YOUR NAME DATE OF BIRTH CALL BACK NUMBER REASON FOR CALL**this is important as we prioritize the call backs  YOU WILL RECEIVE A CALL BACK THE SAME DAY AS LONG AS  YOU CALL BEFORE 4:00 PM

## 2022-08-19 NOTE — Progress Notes (Signed)
ADVANCED HF CLINIC NOTE  Primary Care: McVey, Zachary BhatElizabeth Whitney, PA-C Primary Cardiologist: Dr. Royann Shiversroitoru HF Cardiologist: Dr. Shirlee LatchMcLean  HPI: [name] is a 67 y.o. with a h/o HFpEF, OSA, CPAP, smoker, CKD Stage III, HTN, obesity,and PAF. Has had multiple ED/hospital visits over the last year.  Ongoing compliance issues.   Echo 12/22 EF 60-65%, RV ok.    Admitted 03/2022 with A/C hypoxic/hypercarbic respiratory failure and volume overload. Treated with antibiotics steroids, nebs and diuretics.    Admitted 04/29/22 with increased shortness of breath in the setting of HF exacerbation. Diuresed with IV lasix and transitioned to lasix 80 mg twice a day   Evaluated in the ED 05/2021 with COPD exacerbation. Treated with nebulizers and steroids.    Admitted 06/23/21 with A/C respiratory failure and heart exacerbation. Placed on Bipap with improvement. Diuresed with IV lasix and transitioned to torsemide 100 mg daily. D/C weigth 344 pounds. Discharged 06/26/21 .   [facility] volume up, likely in setting of high salt intake. Instructed to take extra torsemide 50 mg daily.   Admitted 07/17/21 with a/c CHF, diuresed with IV lasix. Discharged home, weight 338 lbs.  Post hospital follow up 3/23, NYHA III, volume OK on exam.   Admitted 4/23 with acute on chronic respiratory failure 2/2 CPAP noncompliance and unintentional pain medication overdose. Failed NIV and required intubation. CHF compensated.  Admitted 5/23 with SOB and AKI. GDMT held. Weight down to 325 lbs at discharge, new SCr baseline 1.5. Amlodipine, torsemide and spiro stopped at discharge. Saw general cardiology at follow up and torsemide restarted at 60 mg daily and spiro at 25 mg daily.  Admitted 12/26 -05/08/23 with A/C respiratory failure and A/C HFpEF. Diuresed with IV lasix and transitioned to torsemide 80 mg daily.   He presents today for routine f/u and f/u echo.  Echo done today shows EF 60-65%, Severe LVH, no significant  valvular abnormalities, RV not well visualized. IVC not dilated. He reports chronic NYHA Class II-early II symptoms, confounded by morbid obesity and deconditioning. Reports compliance w/ med regimen and diuretics. Wt is down 4 lb from previous visit. BP elevated, 140/90. Denies CP. Continues on chronic home O2 and followed by pulmonology. He admits to dietary indiscretion w/ high sodium/ processed foods. Also high sugar diet. Drinks regular soft drinks and fruit juices. Just started Ozempic.    EKG: Not performed   PMH: 1. CKD stage 3 2. OSA: Uses CPAP.  3. HTN 4. Atrial fibrillation: Paroxysmal.  5. COPD: Prior smoker, quit in early 2023.  - Uses 2 L home oxygen.  6. Erectile dysfunction 7. Chronic diastolic CHF: Echo (12/22) with EF 60-65%, normal RV, normal IVC.  8. Hyperlipidemia  ROS: All systems reviewed and negative except as per HPI.   Current Outpatient Medications  Medication Sig Dispense Refill   albuterol (PROVENTIL) (2.5 MG/3ML) 0.083% nebulizer solution Take 3 mLs (2.5 mg total) by nebulization every 6 (six) hours as needed for wheezing or shortness of breath. 75 mL 5   albuterol (VENTOLIN HFA) 108 (90 Base) MCG/ACT inhaler Inhale 2 puffs into the lungs every 4 (four) hours as needed for wheezing or shortness of breath. 18 g 0   amLODipine (NORVASC) 2.5 MG tablet Take 1 tablet (2.5 mg total) by mouth daily. 90 tablet 3   apixaban (ELIQUIS) 5 MG TABS tablet Take 1 tablet (5 mg total) by mouth 2 (two) times daily. 60 tablet 11   atorvastatin (LIPITOR) 10 MG tablet Take 1 tablet (  10 mg total) by mouth daily. 90 tablet 3   Budeson-Glycopyrrol-Formoterol (BREZTRI AEROSPHERE) 160-9-4.8 MCG/ACT AERO Inhale 2 puffs into the lungs in the morning and at bedtime. 10.7 g 11   dapagliflozin propanediol (FARXIGA) 10 MG TABS tablet Take 1 tablet (10 mg total) by mouth daily. 30 tablet 11   EPINEPHrine 0.3 mg/0.3 mL IJ SOAJ injection Inject 0.3 mg into the muscle as needed for anaphylaxis.  1 each 1   famotidine (PEPCID) 20 MG tablet Take 1 tablet (20 mg total) by mouth 2 (two) times daily. 28 tablet 0   fluticasone (FLONASE) 50 MCG/ACT nasal spray PLACE 1 SPRAY INTO BOTH NOSTRILS DAILY. 16 g 11   mupirocin ointment (BACTROBAN) 2 % Place 1 application. into the nose 2 (two) times daily. (Patient taking differently: Place 1 application  into the nose daily as needed.) 22 g 0   potassium chloride SA (KLOR-CON M) 20 MEQ tablet Take 40 mEq by mouth daily.     saline (AYR) GEL Place 1 application into both nostrils at bedtime. (Patient taking differently: Place 1 application  into both nostrils at bedtime as needed (for congestion).) 14.1 g 11   spironolactone (ALDACTONE) 25 MG tablet Take 1 tablet (25 mg total) by mouth daily. 90 tablet 3   tamsulosin (FLOMAX) 0.4 MG CAPS capsule Take 1 capsule (0.4 mg total) by mouth daily after supper. 30 capsule 2   torsemide (DEMADEX) 20 MG tablet Patient takes 2 tablets by mouth daily.     bisoprolol (ZEBETA) 5 MG tablet Take 1 tablet (5 mg total) by mouth daily. 90 tablet 3   No current facility-administered medications for this encounter.   Allergies  Allergen Reactions   Ace Inhibitors Anaphylaxis   Black Cherry Fruit Extract [name]] Anaphylaxis    Tongue   Grenadine Flavor [Flavoring Agent] Anaphylaxis   Social History   Socioeconomic History   Marital status: Single    Spouse name: Not on file   Number of children: Not on file   Years of education: Not on file   Highest education level: Not on file  Occupational History   Not on file  Tobacco Use   Smoking status: Former    Packs/day: 1.00    Years: 36.00    Additional pack years: 0.00    Total pack years: 36.00    Types: Cigarettes    Quit date: 01/08/2021    Years since quitting: 1.6   Smokeless tobacco: Never  Vaping Use   Vaping Use: Never used  Substance and Sexual Activity   Alcohol use: Yes    Alcohol/week: 1.0 standard drink of alcohol    Types: 1 Shots  of liquor per week    Comment: socially   Drug use: Yes    Types: Marijuana    Comment: every other week   Sexual activity: Not on file  Other Topics Concern   Not on file  Social History Narrative   Not on file   Social Determinants of Health   Financial Resource Strain: High Risk (07/15/2021)   Overall Financial Resource Strain (CARDIA)    Difficulty of Paying Living Expenses: Very hard  Food Insecurity: No Food Insecurity (07/15/2021)   Hunger Vital Sign    Worried About Running Out of Food in the Last Year: Never true    Ran Out of Food in the Last Year: Never true  Transportation Needs: No Transportation Needs (07/15/2021)   PRAPARE - Administrator, Civil Service (Medical): No  Lack of Transportation (Non-Medical): No  Physical Activity: Not on file  Stress: Not on file  Social Connections: Not on file  Intimate Partner Violence: Not on file   Family History  Problem Relation Age of Onset   Diabetes Mother    Diabetes Father    BP (!) 140/90   Pulse 92   Wt (!) 157.7 kg (347 lb 9.6 oz)   SpO2 95%   BMI 47.14 kg/m   Wt Readings from Last 3 Encounters:  08/19/22 (!) 157.7 kg (347 lb 9.6 oz)  07/08/22 (!) 158.3 kg (349 lb)  07/01/22 (!) 159.2 kg (351 lb)   PHYSICAL EXAM: General:  supper morbidly obese. No respiratory difficulty HEENT: normal Neck: supple. Thick neck, JVD not well visualized. Carotids 2+ bilat; no bruits. No lymphadenopathy or thyromegaly appreciated. Cor: PMI nondisplaced. Regular rate & rhythm. No rubs, gallops or murmurs. Lungs: clear Abdomen: obese, soft, nontender, nondistended. No hepatosplenomegaly. No bruits or masses. Good bowel sounds. Extremities: no cyanosis, clubbing, rash, edema Neuro: alert & oriented x 3, cranial nerves grossly intact. moves all 4 extremities w/o difficulty. Affect pleasant.    ASSESSMENT & PLAN: 1. Chronic diastolic heart failure: Echo in 12/22 with EF 60-65%, RV normal. Multiple admissions for CHF  in 2023. Echo today EF 60-65%, Severe LVH, no significant valvular abnormalities, RV not well visualized. IVC not dilated.  - NYHA II-early III, counfounded by super morbid obesity, deconditioning, COPD and suspected OHS - Wt down 4lb from prior visit. Volume assessment difficult given body habitus/ obesity but suspect euvolemic based on IVC measurement on today's echo  - continue torsemide 80 mg daily. Check BNP and BMP today  - Increase bisoprolol to 5 mg daily.  - Angioedema with ACEI so not a candidate for ARNI.  - Continue Farxiga 10 mg daily. - Continue spironolactone 25 mg daily. - add amlodipine 2.5 mg daily to help w/ BP control (doubt he would comply w/ tid hydralazine)  - ? Cardiomems, but he has Medicaid => he would be interested, may be able to get this when he goes on Medicare. Also may not be a suitable candidate given chest circumference. Reconsider if wt loss   2. Atrial fibrillation: Paroxysmal. RRR on exam today  - Continue bisoprolol.  - Continue Eliquis 5 mg bid. No bleeding issues. Check CBC today  3. OSA: Reinforced compliance w/ Bipap.  4. COPD: Prior smoker.  Uses home oxygen.  - followed by pulmonology, Dr. Thora Lance  5. Obesity: Body mass index is 47.14 kg/m. - He is now on Ozempic 0.25 mg daily, just started. - discussed smaller food portions and reduction of sugar intake (elimination of soft drinks and fruit juices) 6. CKD Stage IIIa: Baseline around 1.3-1.6.  - Continue SGLT2i. denies GU symptoms - check BMP today  7. Hyperlipidemia: Good lipids 9/23. 8. Hypertension: moderately elevated. Echo shows LVH  - increase bisoprolol to 5 mg daily  - add amlodipine 2.5 mg daily  - f/u w/ pharmD in 2 wks to recheck BP and further adjust meds  F/u w/ Pharm D in 2 wks and APP in 4-6 wks   Robbie Lis, PA-C   08/20/2022

## 2022-08-23 ENCOUNTER — Ambulatory Visit: Payer: Medicaid Other | Admitting: Sports Medicine

## 2022-08-23 ENCOUNTER — Encounter: Payer: Self-pay | Admitting: Sports Medicine

## 2022-08-23 DIAGNOSIS — M25511 Pain in right shoulder: Secondary | ICD-10-CM

## 2022-08-23 DIAGNOSIS — M19011 Primary osteoarthritis, right shoulder: Secondary | ICD-10-CM

## 2022-08-23 DIAGNOSIS — G8929 Other chronic pain: Secondary | ICD-10-CM

## 2022-08-23 MED ORDER — BUPIVACAINE HCL 0.25 % IJ SOLN
2.0000 mL | INTRAMUSCULAR | Status: AC | PRN
  Administered 2022-08-23: 2 mL via INTRA_ARTICULAR

## 2022-08-23 MED ORDER — LIDOCAINE HCL 1 % IJ SOLN
2.0000 mL | INTRAMUSCULAR | Status: AC | PRN
  Administered 2022-08-23: 2 mL

## 2022-08-23 MED ORDER — METHYLPREDNISOLONE ACETATE 40 MG/ML IJ SUSP
40.0000 mg | INTRAMUSCULAR | Status: AC | PRN
  Administered 2022-08-23: 40 mg via INTRA_ARTICULAR

## 2022-08-23 NOTE — Progress Notes (Signed)
Not doing that great. Still having pain Still limited ROM  States he was doing HEP but some cause pain; would like another copy of sheet for refresher.  Uses Voltaren but does not help

## 2022-08-23 NOTE — Progress Notes (Signed)
Zachary Hawkins - 67 y.o. male MRN [id number]  Date of birth: 1956/01/14  Office Visit Note: Visit Date: 08/23/2022 PCP: Zachary Ache, PA-C Referred by: Zachary Hawkins*  Subjective: Chief Complaint  Patient presents with   Right Shoulder - Pain   HPI: Zachary Hawkins is a pleasant 67 y.o. male who presents today for chronic right shoulder pain.  Zachary Hawkins unfortunately is still having some pain in the right shoulder.  He is using topical Voltaren gel.  He had Hawkins a few of the home exercises, but needs a new handout so that he can review these because has not been recently.  Still having pain over the anterior and lateral aspect of the shoulder.  This is keeping him up at night.  Pain does go down the arm but denies any true numbness or tingling.  Pertinent ROS were reviewed with the patient and found to be negative unless otherwise specified above in HPI.   Assessment & Plan: Visit Diagnoses:  1. Chronic right shoulder pain   2. Arthritis of right acromioclavicular joint    Plan: Discussed with Zachary Hawkins the nature of his right shoulder pain.  It has been at least 3 months now and his pain is still bothersome.  As reminder he did have pain a few days after his COVID and influenza vaccine, question whether this is SIRVA or he stopped using the shoulder and there is some stiffness following this.  I do not think he has adhesive capsulitis based on his exam today.  His exam points to more rotator cuff arthropathy versus AC joint pathology.  Shared decision making, did elect to proceed with subacromial joint injection.  Him back in 2 weeks to see what sort of response he gets for this.  If he still has pain over the [facility] joint in 2 weeks, we may consider a one-time ultrasound-guided AC joint injection.  Will continue his topical Voltaren gel.  We did review home therapy exercises for him again today with a new sheet.  Additional diagnostic workup may include formal PT and/or an  MRI of the right shoulder.  Follow-up: Return in about 2 weeks (around 09/06/2022) for for right shoulder (30-mins for poss US-guided AC jt injection).   Meds & Orders: No orders of the defined types were placed in this encounter.   Orders Placed This Encounter  Procedures   Large Joint Inj: R subacromial bursa     Procedures: Large Joint Inj: R subacromial bursa on 08/23/2022 1:28 PM Indications: pain Details: 22 G 1.5 in needle, posterior approach Medications: 2 mL lidocaine 1 %; 2 mL bupivacaine 0.25 %; 40 mg methylPREDNISolone acetate 40 MG/ML Outcome: tolerated well, no immediate complications  Subacromial Joint Injection, Right Shoulder After discussion on risks/benefits/indications, informed verbal consent was obtained. A timeout was then performed. Patient was seated on table in exam room. The patient's shoulder was prepped with betadine and alcohol swabs and utilizing posterior approach a 22G, 1.5" needle was directed anteriorly and laterally into the patient's subacromial space was injected with 2:2:1 mixture of lidocaine:bupivicaine:depomedrol with appreciation of free-flowing of the injectate into the bursal space. Patient tolerated the procedure well without immediate complications.   Procedure, treatment alternatives, risks and benefits explained, specific risks discussed. Consent was given by the patient. Immediately prior to procedure a time out was called to verify the correct patient, procedure, equipment, support staff and site/side marked as required. Patient was prepped and draped in the usual sterile fashion.  Clinical History: No specialty comments available.  He reports that he quit smoking about 19 months ago. His smoking use included cigarettes. He has a 36.00 pack-year smoking history. He has never used smokeless tobacco.  Recent Labs    05/04/22 1513  HGBA1C 7.2*    Objective:   Vital Signs: There were no vitals taken for this visit.  Physical  Exam  Gen: Well-appearing, in no acute distress; non-toxic CV: Regular Rate. Well-perfused. Warm.  Resp: Breathing unlabored on room air; no wheezing. Psych: Fluid speech in conversation; appropriate affect; normal thought process Neuro: Sensation intact throughout. No gross coordination deficits.   Ortho Exam - Right shoulder: + TTP over the right AC joint as well as Codman's point.  There is full active and passive range of motion in all directions, able to reach overhead without difficulty, full range with abduction.  There is some mild restriction with internal range of motion and Gerber liftoff test.  There is a positive painful drop arm test.  There is no significant weakness with rotator cuff testing.  Limitation with scarf's test with associated pain.  Imaging:  4 views of the right shoulder including AP, Grashey, scapular Y and  axillary views were ordered and reviewed by myself.  X-rays demonstrate  mild glenohumeral joint arthritis.  There is some mild calcification  within the [facility] joint without significant narrowing, possible for CPPD.  No  acute fracture or other bony abnormality noted.    Past Medical/Family/Surgical/Social History: Medications & Allergies reviewed per EMR, new medications updated. Patient Active Problem List   Diagnosis Date Noted   Stage 3a chronic kidney disease (CKD) 05/06/2022   ARF (acute renal failure) 10/07/2021   SIRS (systemic inflammatory response syndrome) 10/06/2021   Acute metabolic encephalopathy    Chronic diastolic heart failure    Chronic obstructive pulmonary disease with acute exacerbation    Hyperkalemia    Class 3 obesity 07/18/2021   Acute on chronic diastolic heart failure 07/17/2021   BPH (benign prostatic hyperplasia)    Hyperlipidemia    Acute on chronic respiratory failure with hypoxia and hypercapnia 06/23/2021   Chronic diastolic CHF (congestive heart failure) 06/23/2021   Acute on chronic diastolic (congestive) heart  failure 04/30/2021   AKI (acute kidney injury) 04/29/2021   Chronic respiratory failure with hypercapnia 04/29/2021   Bilateral hydrocele 04/29/2021   Dyspnea on exertion 04/29/2021   Acute respiratory failure with hypoxia 03/10/2021   Urticaria 01/15/2021   Chronic heart failure with preserved ejection fraction    Angioedema 01/04/2021   Influenza vaccine refused 06/26/2020   COVID-19 vaccine series completed 06/26/2020   Paroxysmal atrial fibrillation 07/02/2019   Secondary hypercoagulable state 07/02/2019   OSA (obstructive sleep apnea) 04/03/2019   History of angioedema due to ACE INHIBITORS 10/13/2017   Non compliance w medication regimen 07/13/2017   Essential hypertension 06/23/2017   Acute kidney injury superimposed on chronic kidney disease    Cardiomyopathy 06/06/2017   Dyspnea 06/05/2017   Past Medical History:  Diagnosis Date   Asthma    BPH (benign prostatic hyperplasia)    Cardiomyopathy 06/2017   EF 40-45%   CHF (congestive heart failure)    Chronic renal insufficiency, stage 3 (moderate)    COPD (chronic obstructive pulmonary disease)    Diastolic dysfunction 06/2017   grade 2    Hyperlipidemia    Hypertension    Hypertensive urgency    Obesity    Obesity    OSA (obstructive sleep apnea)    Paroxysmal  atrial fibrillation 2022   on Eliquis   Family History  Problem Relation Age of Onset   Diabetes Mother    Diabetes Father    No past surgical history on file. Social History   Occupational History   Not on file  Tobacco Use   Smoking status: Former    Packs/day: 1.00    Years: 36.00    Additional pack years: 0.00    Total pack years: 36.00    Types: Cigarettes    Quit date: 01/08/2021    Years since quitting: 1.6   Smokeless tobacco: Never  Vaping Use   Vaping Use: Never used  Substance and Sexual Activity   Alcohol use: Yes    Alcohol/week: 1.0 standard drink of alcohol    Types: 1 Shots of liquor per week    Comment: socially   Drug  use: Yes    Types: Marijuana    Comment: every other week   Sexual activity: Not on file

## 2022-09-13 ENCOUNTER — Ambulatory Visit (HOSPITAL_COMMUNITY)
Admission: RE | Admit: 2022-09-13 | Discharge: 2022-09-13 | Disposition: A | Discharge status: Home / Self Care | Payer: Medicaid Other | Source: Ambulatory Visit | Attending: Internal Medicine | Admitting: Internal Medicine

## 2022-09-13 VITALS — BP 156/92 | HR 86 | Wt 349.0 lb

## 2022-09-13 DIAGNOSIS — Z6841 Body Mass Index (BMI) 40.0 and over, adult: Secondary | ICD-10-CM | POA: Insufficient documentation

## 2022-09-13 DIAGNOSIS — Z7901 Long term (current) use of anticoagulants: Secondary | ICD-10-CM | POA: Diagnosis not present

## 2022-09-13 DIAGNOSIS — Z87891 Personal history of nicotine dependence: Secondary | ICD-10-CM | POA: Insufficient documentation

## 2022-09-13 DIAGNOSIS — E785 Hyperlipidemia, unspecified: Secondary | ICD-10-CM | POA: Insufficient documentation

## 2022-09-13 DIAGNOSIS — Z794 Long term (current) use of insulin: Secondary | ICD-10-CM | POA: Insufficient documentation

## 2022-09-13 DIAGNOSIS — Z91199 Patient's noncompliance with other medical treatment and regimen due to unspecified reason: Secondary | ICD-10-CM | POA: Insufficient documentation

## 2022-09-13 DIAGNOSIS — Z79899 Other long term (current) drug therapy: Secondary | ICD-10-CM | POA: Diagnosis not present

## 2022-09-13 DIAGNOSIS — I480 Paroxysmal atrial fibrillation: Secondary | ICD-10-CM | POA: Diagnosis not present

## 2022-09-13 DIAGNOSIS — G4733 Obstructive sleep apnea (adult) (pediatric): Secondary | ICD-10-CM | POA: Insufficient documentation

## 2022-09-13 DIAGNOSIS — I130 Hypertensive heart and chronic kidney disease with heart failure and stage 1 through stage 4 chronic kidney disease, or unspecified chronic kidney disease: Secondary | ICD-10-CM | POA: Diagnosis not present

## 2022-09-13 DIAGNOSIS — I5032 Chronic diastolic (congestive) heart failure: Secondary | ICD-10-CM

## 2022-09-13 DIAGNOSIS — Z9981 Dependence on supplemental oxygen: Secondary | ICD-10-CM | POA: Insufficient documentation

## 2022-09-13 DIAGNOSIS — Z7984 Long term (current) use of oral hypoglycemic drugs: Secondary | ICD-10-CM | POA: Insufficient documentation

## 2022-09-13 DIAGNOSIS — N1832 Chronic kidney disease, stage 3b: Secondary | ICD-10-CM | POA: Insufficient documentation

## 2022-09-13 DIAGNOSIS — J449 Chronic obstructive pulmonary disease, unspecified: Secondary | ICD-10-CM | POA: Insufficient documentation

## 2022-09-13 MED ORDER — POTASSIUM CHLORIDE CRYS ER 20 MEQ PO TBCR: 20.0000 meq | EXTENDED_RELEASE_TABLET | Freq: Every day | ORAL | 3 refills | Status: DC

## 2022-09-13 MED ORDER — SPIRONOLACTONE 25 MG PO TABS: 50.0000 mg | ORAL_TABLET | Freq: Every day | ORAL | 3 refills | Status: DC

## 2022-09-13 NOTE — Progress Notes (Signed)
Advanced Heart Failure Clinic Note   Primary Care: McVey, Madelaine Bhat, PA-C Primary Cardiologist: Dr. Royann Shivers HF Cardiologist: Dr. Shirlee Latch   HPI: [name] is a 67 y.o. with a h/o HFpEF, OSA, CPAP, smoker, CKD Stage III, HTN, obesity, and PAF. Has had multiple ED/hospital visits over the last year.  Ongoing compliance issues.    Echo 04/2021 with EF 60-65%, normal RV.    Admitted 03/2022 with A/C hypoxic/hypercarbic respiratory failure and volume overload. Treated with antibiotics, steroids, nebs and diuretics.    Admitted 04/29/22 with increased shortness of breath in the setting of HF exacerbation. Diuresed with IV Lasix and transitioned to Lasix 80 mg twice a day   Evaluated in the ED 05/2021 with COPD exacerbation. Treated with nebulizers and steroids.    Admitted 06/23/21 with A/C respiratory failure and heart exacerbation. Placed on Bipap with improvement. Diuresed with IV Lasix and transitioned to torsemide 100 mg daily. D/C weight 344 pounds. Discharged 06/26/21 .   Seen in TOC 07/2021, volume up, likely in setting of high salt intake. Instructed to take extra torsemide 50 mg daily.    Admitted 07/17/21 with a/c CHF, diuresed with IV Lasix. Discharged home, weight 338 lbs.   Post hospital follow up 07/2021, NYHA III, volume OK on exam.    Admitted 08/2021 with acute on chronic respiratory failure 2/2 CPAP noncompliance and unintentional pain medication overdose. Failed NIV and required intubation. CHF compensated.   Admitted 09/2021 with SOB and AKI. GDMT held. Weight down to 325 lbs at discharge, new SCr baseline 1.5. Amlodipine, torsemide and spironolactone stopped at discharge. Saw general cardiology at follow up and torsemide restarted at 60 mg daily and spironolactone at 25 mg daily.   Admitted 05/04/22 with A/C respiratory failure and A/C HFpEF. Diuresed with IV Lasix and transitioned to torsemide 80 mg daily.   Presented to APP clinic on 08/19/22. Euvolemic with NYHA  class II symptoms. BP was elevated so bisoprolol was increased and amlodipine was added.  Today he returns to HF clinic for pharmacist medication titration. Overall feeling good. No dizziness, lightheadedness, or fatigue. No chest pain or palpitations. Breathing is at baseline. He has recently reduced to 3L O2 at home and has acquired a portable oxygen delivery device. Able to complete all ADLs. Activity level low. Weight at home is generally 341-345 pounds. Pt is unsure of the exact torsemide dosage he takes every day. He reports it is somewhere between 2-3 20 mg tablets daily. No LEE, PND, or orthopnea. Appetite good. Does not adhere to low-salt diet.   HF Medications: Dapagliflozin 10 mg daily Spironolactone 25 mg daily Ozempic 0.25 mg weekly (STEP-HFpEF) Torsemide 40-60 mg daily + KCl 40 meq daily   Has the patient been experiencing any side effects to the medications prescribed?  No  Does the patient have any problems obtaining medications due to transportation or finances?   No. BCBS Medicaid.   Understanding of regimen: poor. Not successfully able to identify most of his medications, but endorses receiving pill packs monthly from [facility]. Called [facility], who confirmed that last pill pack fill was on 09/03/22. Understanding of indications: poor Potential of compliance: fair Patient understands to avoid NSAIDs. Patient understands to avoid decongestants.     Pertinent Lab Values (08/19/22): Serum creatinine 1.28, BUN 16, Potassium 4.5, Sodium 137, BNP 23.5, Magnesium    Vital Signs: Weight: 349 lbs (last clinic weight: 347 lbs) Blood pressure: 156/92 mmHg  Heart rate: 86 bpm   Assessment/Plan: 1. Chronic diastolic heart  failure: Echo in 04/2021 with EF 60-65%, RV normal. Multiple admissions for CHF in 2023. Echo today EF 60-65%, Severe LVH, no significant valvular abnormalities, RV not well visualized. IVC not dilated.  - NYHA II- early III, counfounded by super morbid  obesity, deconditioning, COPD and suspected OHS - Wt stable from prior visit. Volume assessment difficult given body habitus/ obesity but suspect euvolemic with no LEE or change in symptoms. - Continue torsemide 40-60 mg daily. Patient is unsure exactly how he takes torsemide. He will bring his pills to the next visit for review. - Angioedema with ACEI so not a candidate for ARNI.  - Continue Farxiga 10 mg daily. - Increase spironolactone to 50 mg daily. Called [facility] to arrange this in next pill pack. In the meantime patient was educated to take one spironolactone tablet in addition to his pill pack until the next pill pack starts. Reduce potassium to 20 meq daily. Repeat BMET in 1 week. - Continue Ozempic 0.25 mg daily. - Previously noted he would be interested in CardioMems. May be able to get this when he goes on Medicare. Per MD, may not be a suitable candidate given chest circumference. MD to reconsider if able to lose weight. 2. Atrial fibrillation: Paroxysmal.  - Continue bisoprolol 5 mg daily. Would avoid further titration unless needed for rate control given HFpEF. - Continue Eliquis 5 mg BID. No bleeding issues.   3. OSA: Reinforced compliance w/ Bipap.  4. COPD: Prior smoker.  Uses home oxygen. Now down to 3L. - Followed by pulmonology, Dr. Thora Lance  5. Obesity: Body mass index is 47.14 kg/m. - He is now on Ozempic 0.25 mg daily. - discussed smaller food portions and reduction of sugar intake (elimination of soft drinks and fruit juices) 6. CKD Stage IIIa: Baseline around 1.3-1.6.  - Continue SGLT2i. denies GU symptoms 7. Hyperlipidemia: Good lipids 01/2022. 8. Hypertension: moderately elevated. Echo shows LVH  - Continue bisoprolol 5 mg daily. Would avoid further titration given HFpEF. - Continue amlodipine 2.5 mg daily   Follow up in 1 week for BMET. Follow up in APP clinic on 11/18/22.   Karle Plumber, PharmD, BCPS, BCCP, CPP Heart Failure Clinic  Pharmacist [phone]

## 2022-09-13 NOTE — Patient Instructions (Signed)
It was a pleasure seeing you today!  MEDICATIONS: -We are changing your medications today -Increase spironolactone to 50 mg daily -Reduce potassium to one 20 meq tablet daily -Call if you have questions about your medications.  LABS: -We will call you if your labs need attention.  NEXT APPOINTMENT: Return for labs 09/21/22  In general, to take care of your heart failure: -Limit your fluid intake to 2 Liters (half-gallon) per day.   -Limit your salt intake to ideally 2-3 grams (2000-3000 mg) per day. -Weigh yourself daily and record, and bring that "weight diary" to your next appointment.  (Weight gain of 2-3 pounds in 1 day typically means fluid weight.) -The medications for your heart are to help your heart and help you live longer.   -Please contact us before stopping any of your heart medications.  Call the clinic at [phone] with questions or to reschedule future appointments.

## 2022-09-21 ENCOUNTER — Other Ambulatory Visit (HOSPITAL_COMMUNITY): Payer: Medicaid Other

## 2022-09-23 ENCOUNTER — Other Ambulatory Visit (HOSPITAL_COMMUNITY): Payer: Medicaid Other

## 2022-09-23 NOTE — Progress Notes (Deleted)
Synopsis: Referred for decreased O2 saturation, restrictive lung disease, dyspnea by Newt Minion*  Subjective:   PATIENT ID: Zachary Hawkins GENDER: male DOB: 12-27-1955, MRN: [id number]  No chief complaint on file.  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 He was referred to allergy/immunology.   He has some DOE. Taking advair one puff BID. No cough especially since stopping smoking.   He never had any itching during either of the above episodes. Tryptase was negative.   He is finishing course of steroids.   Snores. Some PND. Some daytime sleepiness.   He had an episode yesterday where home oxygen cut off and it worried him.   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   Interval HPI  Since last visit hospitalized twice for acute on chronic hypercapnic hypoxic respiratory failure requiring NIV, intubation in April. Discharged on torsemide 80 daily, spironolactone 25 mg daily, SGLT2i.  He doesn't really have cough.   He thinks he's doing well since leaving hospital. Weight is back up to 346 lb however. Down to 325 lb at end of last hospitalization. Takes all of his medicines in blister pack which pharmacy arranges. Home health nurse is helping him with adherence.   He says he's using trelegy most nights.   Seen by CHF 07/01/22 - no change, weight was up 20 lb but unclear if fluid retention vs increased caloric intake  At last ov with TP continued advair and started spiriva, encouraged to restart using  trilogy vent  No bothersome cough. Doesn't feel like his DOE is any worse than at last visit.   Asks for POC walk today.   Trilogy taken away 2 months ago due to inconsistent use  ------------------------- Since last visit transitioned to breztri  At last cardiology appt, bisoprolol increased to 5mg  daily, amlodipine 2.5mg  daily started. No change to torsemide. Pharmacy increased his aldactone to 50 mg daily.  Has been started on ozempic.  Otherwise pertinent review of systems is negative.  Past Medical History:  Diagnosis Date   Asthma    BPH (benign prostatic hyperplasia)    Cardiomyopathy (HCC) 06/2017   EF 40-45%   CHF (congestive heart failure) (HCC)    Chronic renal insufficiency, stage 3 (moderate) (HCC)    COPD (chronic obstructive pulmonary disease) (HCC)    Diastolic dysfunction 06/2017   grade 2    Hyperlipidemia    Hypertension    Hypertensive urgency    Obesity    Obesity    OSA (obstructive sleep apnea)    Paroxysmal atrial fibrillation (HCC) 2022   on Eliquis     Family History  Problem Relation Age of Onset   Diabetes Mother    Diabetes Father      No past surgical history on file.  Social History   Socioeconomic History   Marital status: Single    Spouse name: Not on file   Number of children: Not on file   Years of education: Not on file   Highest education level: Not on file  Occupational History  Not on file  Tobacco Use   Smoking status: Former    Packs/day: 1.00    Years: 36.00    Additional pack years: 0.00    Total pack years: 36.00    Types: Cigarettes    Quit date: 01/08/2021    Years since quitting: 1.7   Smokeless tobacco: Never  Vaping Use   Vaping Use: Never used  Substance and Sexual Activity   Alcohol use: Yes    Alcohol/week: 1.0 standard drink of alcohol    Types: 1 Shots of liquor per week    Comment: socially   Drug use: Yes    Types: Marijuana    Comment: every other week   Sexual activity: Not on file   Other Topics Concern   Not on file  Social History Narrative   Not on file   Social Determinants of Health   Financial Resource Strain: High Risk (07/15/2021)   Overall Financial Resource Strain (CARDIA)    Difficulty of Paying Living Expenses: Very hard  Food Insecurity: No Food Insecurity (07/15/2021)   Hunger Vital Sign    Worried About Running Out of Food in the Last Year: Never true    Ran Out of Food in the Last Year: Never true  Transportation Needs: No Transportation Needs (07/15/2021)   PRAPARE - Administrator, Civil Service (Medical): No    Lack of Transportation (Non-Medical): No  Physical Activity: Not on file  Stress: Not on file  Social Connections: Not on file  Intimate Partner Violence: Not on file     Allergies  Allergen Reactions   Ace Inhibitors Anaphylaxis   Black Cherry Fruit Extract [name]] Anaphylaxis    Tongue   Grenadine Flavor [Flavoring Agent] Anaphylaxis     Outpatient Medications Prior to Visit  Medication Sig Dispense Refill   albuterol (PROVENTIL) (2.5 MG/3ML) 0.083% nebulizer solution Take 3 mLs (2.5 mg total) by nebulization every 6 (six) hours as needed for wheezing or shortness of breath. 75 mL 5   albuterol (VENTOLIN HFA) 108 (90 Base) MCG/ACT inhaler Inhale 2 puffs into the lungs every 4 (four) hours as needed for wheezing or shortness of breath. 18 g 0   amLODipine (NORVASC) 2.5 MG tablet Take 1 tablet (2.5 mg total) by mouth daily. 90 tablet 3   apixaban (ELIQUIS) 5 MG TABS tablet Take 1 tablet (5 mg total) by mouth 2 (two) times daily. 60 tablet 11   atorvastatin (LIPITOR) 10 MG tablet Take 1 tablet (10 mg total) by mouth daily. 90 tablet 3   bisoprolol (ZEBETA) 5 MG tablet Take 1 tablet (5 mg total) by mouth daily. 90 tablet 3   Budeson-Glycopyrrol-Formoterol (BREZTRI AEROSPHERE) 160-9-4.8 MCG/ACT AERO Inhale 2 puffs into the lungs in the morning and at bedtime. 10.7 g 11   dapagliflozin propanediol (FARXIGA) 10 MG  TABS tablet Take 1 tablet (10 mg total) by mouth daily. 30 tablet 11   EPINEPHrine 0.3 mg/0.3 mL IJ SOAJ injection Inject 0.3 mg into the muscle as needed for anaphylaxis. 1 each 1   famotidine (PEPCID) 20 MG tablet Take 1 tablet (20 mg total) by mouth 2 (two) times daily. 28 tablet 0   fluticasone (FLONASE) 50 MCG/ACT nasal spray PLACE 1 SPRAY INTO BOTH NOSTRILS DAILY. 16 g 11   mupirocin ointment (BACTROBAN) 2 % Place 1 application. into the nose 2 (two) times daily. (Patient taking differently: Place 1 application  into the nose daily as needed.) 22 g 0   potassium  chloride SA (KLOR-CON M) 20 MEQ tablet Take 1 tablet (20 mEq total) by mouth daily. 30 tablet 3   saline (AYR) GEL Place 1 application into both nostrils at bedtime. (Patient taking differently: Place 1 application  into both nostrils at bedtime as needed (for congestion).) 14.1 g 11   spironolactone (ALDACTONE) 25 MG tablet Take 2 tablets (50 mg total) by mouth daily. 90 tablet 3   tamsulosin (FLOMAX) 0.4 MG CAPS capsule Take 1 capsule (0.4 mg total) by mouth daily after supper. 30 capsule 2   torsemide (DEMADEX) 20 MG tablet Patient takes 2 tablets by mouth daily.     No facility-administered medications prior to visit.       Objective:   Physical Exam:  General appearance: 67 y.o., male, NAD, conversant  Eyes: anicteric sclerae; PERRL, tracking appropriately HENT: NCAT; MMM Neck: Trachea midline; no lymphadenopathy, no JVD Lungs: distant, no crackles, no wheeze, with normal respiratory effort on 2L O2 CV: RRR, no murmur  Abdomen: Soft, non-tender; non-distended, BS present  Extremities: trace peripheral edema, warm Skin: Normal turgor and texture; no rash Psych: Appropriate affect Neuro: Alert and oriented to person and place, no focal deficit     There were no vitals filed for this visit.         on 2L BMI Readings from Last 3 Encounters:  09/13/22 47.33 kg/m  08/19/22 47.14 kg/m  07/08/22 47.33 kg/m    Wt Readings from Last 3 Encounters:  09/13/22 (!) 349 lb (158.3 kg)  08/19/22 (!) 347 lb 9.6 oz (157.7 kg)  07/08/22 (!) 349 lb (158.3 kg)     CBC    Component Value Date/Time   WBC 7.7 05/07/2022 0206   RBC 4.94 05/07/2022 0206   HGB 13.1 05/07/2022 0206   HGB 16.7 07/11/2020 1103   HCT 45.8 05/07/2022 0206   HCT 50.3 07/11/2020 1103   PLT 229 05/07/2022 0206   PLT 232 07/11/2020 1103   MCV 92.7 05/07/2022 0206   MCV 88 07/11/2020 1103   MCH 26.5 05/07/2022 0206   MCHC 28.6 (L) 05/07/2022 0206   RDW 17.8 (H) 05/07/2022 0206   RDW 13.6 07/11/2020 1103   LYMPHSABS 0.6 (L) 05/04/2022 1300   MONOABS 0.9 05/04/2022 1300   EOSABS 0.3 05/04/2022 1300   BASOSABS 0.0 05/04/2022 1300   C4 31 IgE 394 C1 inh above normal  Tryptase WNL  Chest Imaging: CXR 01/13/21 reviewed by me and unremarkable  CT A/P 04/29/21 lung bases reviewed by with RLL scar and lower lobe ?cysts   CTA Chest 05/11/21 reviewed by me with bronchial wall thickening, stable scar  CXR 08/17/21 reviewed by me with cardiomegaly, likely RLL atelectasis, possible retrocardiac opacity  CXR 05/27/22 stable relative to prior  Pulmonary Functions Testing Results:    Latest Ref Rng & Units 12/23/2021    1:24 PM  PFT Results  DLCO uncorrected ml/min/mmHg 17.79  P  DLCO UNC% % 60  P  DLCO corrected ml/min/mmHg 17.79  P  DLCO COR %Predicted % 60  P  DLVA Predicted % 129  P  TLC L 4.55  P  TLC % Predicted % 59  P  RV % Predicted % 66  P    P Preliminary result   PFT 12/23/21 reviewed by me with restriction, mildly reduced diffusing capacity with elevated KCO, unable to perform spirometry    Echocardiogram:   TTE 01/08/21 EF 60-65%, G1DD  TTE 04/30/21 essentially normal  TTE 08/19/22: 1. Left ventricular ejection fraction,  by estimation, is 60 to 65%. The  left ventricle has normal function. Left ventricular endocardial border  not optimally defined to evaluate regional wall motion. There is severe   concentric left ventricular  hypertrophy. Left ventricular diastolic parameters are consistent with  Grade I diastolic dysfunction (impaired relaxation).   2. Right ventricular systolic function was not well visualized. The right  ventricular size is not well visualized.   3. The mitral valve is grossly normal. No evidence of mitral valve  regurgitation.   4. The aortic valve was not well visualized. Aortic valve regurgitation  is not visualized. No aortic stenosis is present.   5. Aortic dilatation noted. There is mild dilatation of the ascending  aorta, measuring 41 mm.   6. The inferior vena cava is normal in size with greater than 50%  respiratory variability, suggesting right atrial pressure of 3 mmHg.     Assessment & Plan:   # Dyspnea on exertion: # OSA # HFpEF # Chronic hypoxic hypercapnic respiratory failure May be multifactorial from deconditioning, diastolic dysfunction (but looks well compensated today), undertreated sleep-disordered breathing, possible asthma/copd though has been unable to perform spirometry. Doing better than at last visit however. Near last dry weight per CHF note (~340-345 lb). Walked for POC today, will send prescription for POC at 4L with exertion, 2L O2 at rest and will need 4L O2 with sleep.  # Angioedema: possible that it is still related to ACEi even years from taking it. Not sure what to make of the elevated C1inh level.  # Smoking: Congratulated him on cessation. Does not qualify for lung cancer screening with <20 py smoking.  Plan: - Would stay on same dose of torsemide, aldactone, farxiga - Would start breztri 2 puffs twice daily EVERYDAY to simplify bronchodilator regimen, rinse mouth and brush tongue/teeth after each use - stop advair, spiriva - albuterol 1-2 puffs as needed up to 4-6 times daily - this is your RESCUE inhaler - will reach out to [facility] about your trilogy vent to verify lapses in use coincide with admissions. Will also  request POC as above - weight loss encouraged - See you in 3 months or sooner if need be!  RTC 3 months      Omar Person, MD [facility] 09/23/2022 1:15 PM

## 2022-09-27 ENCOUNTER — Ambulatory Visit: Payer: Medicaid Other | Admitting: Student

## 2022-09-27 NOTE — Progress Notes (Unsigned)
Synopsis: Referred for decreased O2 saturation, restrictive lung disease, dyspnea by Newt Minion*  Subjective:   PATIENT ID: Duwaine Maxin GENDER: male DOB: [birth year], MRN: [id number]  No chief complaint on file.  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 He was referred to allergy/immunology.   He has some DOE. Taking advair one puff BID. No cough especially since stopping smoking.   He never had any itching during either of the above episodes. Tryptase was negative.   He is finishing course of steroids.   Snores. Some PND. Some daytime sleepiness.   He had an episode yesterday where home oxygen cut off and it worried him.   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   Interval HPI  Since last visit hospitalized twice for acute on chronic hypercapnic hypoxic respiratory failure requiring NIV, intubation in April. Discharged on torsemide 80 daily, spironolactone 25 mg daily, SGLT2i.  He doesn't really have cough.   He thinks he's doing well since leaving hospital. Weight is back up to 346 lb however. Down to 325 lb at end of last hospitalization. Takes all of his medicines in blister pack which pharmacy arranges. Home health nurse is helping him with adherence.   He says he's using trelegy most nights.   Seen by CHF 07/01/22 - no change, weight was up 20 lb but unclear if fluid retention vs increased caloric intake  At last ov with TP continued advair and started spiriva, encouraged to restart using  trilogy vent  No bothersome cough. Doesn't feel like his DOE is any worse than at last visit.   Asks for POC walk today.   Trilogy taken away 2 months ago due to inconsistent use  ------------------------- Since last visit transitioned to breztri  At last cardiology appt, bisoprolol increased to 5mg  daily, amlodipine 2.5mg  daily started. No change to torsemide. Pharmacy increased his aldactone to 50 mg daily.  Has been started on ozempic.  Otherwise pertinent review of systems is negative.  Past Medical History:  Diagnosis Date   Asthma    BPH (benign prostatic hyperplasia)    Cardiomyopathy (HCC) 06/2017   EF 40-45%   CHF (congestive heart failure) (HCC)    Chronic renal insufficiency, stage 3 (moderate) (HCC)    COPD (chronic obstructive pulmonary disease) (HCC)    Diastolic dysfunction 06/2017   grade 2    Hyperlipidemia    Hypertension    Hypertensive urgency    Obesity    Obesity    OSA (obstructive sleep apnea)    Paroxysmal atrial fibrillation (HCC) 2022   on Eliquis     Family History  Problem Relation Age of Onset   Diabetes Mother    Diabetes Father      No past surgical history on file.  Social History   Socioeconomic History   Marital status: Single    Spouse name: Not on file   Number of children: Not on file   Years of education: Not on file   Highest education level: Not on file  Occupational History  Not on file  Tobacco Use   Smoking status: Former    Packs/day: 1.00    Years: 36.00    Additional pack years: 0.00    Total pack years: 36.00    Types: Cigarettes    Quit date: 01/08/2021    Years since quitting: 1.7   Smokeless tobacco: Never  Vaping Use   Vaping Use: Never used  Substance and Sexual Activity   Alcohol use: Yes    Alcohol/week: 1.0 standard drink of alcohol    Types: 1 Shots of liquor per week    Comment: socially   Drug use: Yes    Types: Marijuana    Comment: every other week   Sexual activity: Not on file   Other Topics Concern   Not on file  Social History Narrative   Not on file   Social Determinants of Health   Financial Resource Strain: High Risk (07/15/2021)   Overall Financial Resource Strain (CARDIA)    Difficulty of Paying Living Expenses: Very hard  Food Insecurity: No Food Insecurity (07/15/2021)   Hunger Vital Sign    Worried About Running Out of Food in the Last Year: Never true    Ran Out of Food in the Last Year: Never true  Transportation Needs: No Transportation Needs (07/15/2021)   PRAPARE - Administrator, Civil Service (Medical): No    Lack of Transportation (Non-Medical): No  Physical Activity: Not on file  Stress: Not on file  Social Connections: Not on file  Intimate Partner Violence: Not on file     Allergies  Allergen Reactions   Ace Inhibitors Anaphylaxis   Black Cherry Fruit Extract [name]] Anaphylaxis    Tongue   Grenadine Flavor [Flavoring Agent] Anaphylaxis     Outpatient Medications Prior to Visit  Medication Sig Dispense Refill   albuterol (PROVENTIL) (2.5 MG/3ML) 0.083% nebulizer solution Take 3 mLs (2.5 mg total) by nebulization every 6 (six) hours as needed for wheezing or shortness of breath. 75 mL 5   albuterol (VENTOLIN HFA) 108 (90 Base) MCG/ACT inhaler Inhale 2 puffs into the lungs every 4 (four) hours as needed for wheezing or shortness of breath. 18 g 0   amLODipine (NORVASC) 2.5 MG tablet Take 1 tablet (2.5 mg total) by mouth daily. 90 tablet 3   apixaban (ELIQUIS) 5 MG TABS tablet Take 1 tablet (5 mg total) by mouth 2 (two) times daily. 60 tablet 11   atorvastatin (LIPITOR) 10 MG tablet Take 1 tablet (10 mg total) by mouth daily. 90 tablet 3   bisoprolol (ZEBETA) 5 MG tablet Take 1 tablet (5 mg total) by mouth daily. 90 tablet 3   Budeson-Glycopyrrol-Formoterol (BREZTRI AEROSPHERE) 160-9-4.8 MCG/ACT AERO Inhale 2 puffs into the lungs in the morning and at bedtime. 10.7 g 11   dapagliflozin propanediol (FARXIGA) 10 MG  TABS tablet Take 1 tablet (10 mg total) by mouth daily. 30 tablet 11   EPINEPHrine 0.3 mg/0.3 mL IJ SOAJ injection Inject 0.3 mg into the muscle as needed for anaphylaxis. 1 each 1   famotidine (PEPCID) 20 MG tablet Take 1 tablet (20 mg total) by mouth 2 (two) times daily. 28 tablet 0   fluticasone (FLONASE) 50 MCG/ACT nasal spray PLACE 1 SPRAY INTO BOTH NOSTRILS DAILY. 16 g 11   mupirocin ointment (BACTROBAN) 2 % Place 1 application. into the nose 2 (two) times daily. (Patient taking differently: Place 1 application  into the nose daily as needed.) 22 g 0   potassium  chloride SA (KLOR-CON M) 20 MEQ tablet Take 1 tablet (20 mEq total) by mouth daily. 30 tablet 3   saline (AYR) GEL Place 1 application into both nostrils at bedtime. (Patient taking differently: Place 1 application  into both nostrils at bedtime as needed (for congestion).) 14.1 g 11   spironolactone (ALDACTONE) 25 MG tablet Take 2 tablets (50 mg total) by mouth daily. 90 tablet 3   tamsulosin (FLOMAX) 0.4 MG CAPS capsule Take 1 capsule (0.4 mg total) by mouth daily after supper. 30 capsule 2   torsemide (DEMADEX) 20 MG tablet Patient takes 2 tablets by mouth daily.     No facility-administered medications prior to visit.       Objective:   Physical Exam:  General appearance: 67 y.o., male, NAD, conversant  Eyes: anicteric sclerae; PERRL, tracking appropriately HENT: NCAT; MMM Neck: Trachea midline; no lymphadenopathy, no JVD Lungs: distant, no crackles, no wheeze, with normal respiratory effort on 2L O2 CV: RRR, no murmur  Abdomen: Soft, non-tender; non-distended, BS present  Extremities: trace peripheral edema, warm Skin: Normal turgor and texture; no rash Psych: Appropriate affect Neuro: Alert and oriented to person and place, no focal deficit     There were no vitals filed for this visit.         on 2L BMI Readings from Last 3 Encounters:  09/13/22 47.33 kg/m  08/19/22 47.14 kg/m  07/08/22 47.33 kg/m    Wt Readings from Last 3 Encounters:  09/13/22 (!) 349 lb (158.3 kg)  08/19/22 (!) 347 lb 9.6 oz (157.7 kg)  07/08/22 (!) 349 lb (158.3 kg)     CBC    Component Value Date/Time   WBC 7.7 05/07/2022 0206   RBC 4.94 05/07/2022 0206   HGB 13.1 05/07/2022 0206   HGB 16.7 07/11/2020 1103   HCT 45.8 05/07/2022 0206   HCT 50.3 07/11/2020 1103   PLT 229 05/07/2022 0206   PLT 232 07/11/2020 1103   MCV 92.7 05/07/2022 0206   MCV 88 07/11/2020 1103   MCH 26.5 05/07/2022 0206   MCHC 28.6 (L) 05/07/2022 0206   RDW 17.8 (H) 05/07/2022 0206   RDW 13.6 07/11/2020 1103   LYMPHSABS 0.6 (L) 05/04/2022 1300   MONOABS 0.9 05/04/2022 1300   EOSABS 0.3 05/04/2022 1300   BASOSABS 0.0 05/04/2022 1300   C4 31 IgE 394 C1 inh above normal  Tryptase WNL  Chest Imaging: CXR 01/13/21 reviewed by me and unremarkable  CT A/P 04/29/21 lung bases reviewed by with RLL scar and lower lobe ?cysts   CTA Chest 05/11/21 reviewed by me with bronchial wall thickening, stable scar  CXR 08/17/21 reviewed by me with cardiomegaly, likely RLL atelectasis, possible retrocardiac opacity  CXR 05/27/22 stable relative to prior  Pulmonary Functions Testing Results:    Latest Ref Rng & Units 12/23/2021    1:24 PM  PFT Results  DLCO uncorrected ml/min/mmHg 17.79  P  DLCO UNC% % 60  P  DLCO corrected ml/min/mmHg 17.79  P  DLCO COR %Predicted % 60  P  DLVA Predicted % 129  P  TLC L 4.55  P  TLC % Predicted % 59  P  RV % Predicted % 66  P    P Preliminary result   PFT 12/23/21 reviewed by me with restriction, mildly reduced diffusing capacity with elevated KCO, unable to perform spirometry    Echocardiogram:   TTE 01/08/21 EF 60-65%, G1DD  TTE 04/30/21 essentially normal  TTE 08/19/22: 1. Left ventricular ejection fraction,  by estimation, is 60 to 65%. The  left ventricle has normal function. Left ventricular endocardial border  not optimally defined to evaluate regional wall motion. There is severe   concentric left ventricular  hypertrophy. Left ventricular diastolic parameters are consistent with  Grade I diastolic dysfunction (impaired relaxation).   2. Right ventricular systolic function was not well visualized. The right  ventricular size is not well visualized.   3. The mitral valve is grossly normal. No evidence of mitral valve  regurgitation.   4. The aortic valve was not well visualized. Aortic valve regurgitation  is not visualized. No aortic stenosis is present.   5. Aortic dilatation noted. There is mild dilatation of the ascending  aorta, measuring 41 mm.   6. The inferior vena cava is normal in size with greater than 50%  respiratory variability, suggesting right atrial pressure of 3 mmHg.     Assessment & Plan:   # Dyspnea on exertion: # OSA # HFpEF # Chronic hypoxic hypercapnic respiratory failure May be multifactorial from deconditioning, diastolic dysfunction (but looks well compensated today), undertreated sleep-disordered breathing, possible asthma/copd though has been unable to perform spirometry. Doing better than at last visit however. Near last dry weight per CHF note (~340-345 lb). Walked for POC today, will send prescription for POC at 4L with exertion, 2L O2 at rest and will need 4L O2 with sleep.  # Angioedema: possible that it is still related to ACEi even years from taking it. Not sure what to make of the elevated C1inh level.  # Smoking: Congratulated him on cessation. Does not qualify for lung cancer screening with <20 py smoking.  Plan: - Would stay on same dose of torsemide, aldactone, farxiga - Would start breztri 2 puffs twice daily EVERYDAY to simplify bronchodilator regimen, rinse mouth and brush tongue/teeth after each use - stop advair, spiriva - albuterol 1-2 puffs as needed up to 4-6 times daily - this is your RESCUE inhaler - will reach out to [employer] about your trilogy vent to verify lapses in use coincide with admissions. Will also  request POC as above - weight loss encouraged - See you in 3 months or sooner if need be!  RTC 3 months      Omar Person, MD [facility] 09/27/2022 6:06 PM

## 2022-09-29 ENCOUNTER — Ambulatory Visit: Payer: Medicaid Other | Admitting: Student

## 2022-09-29 ENCOUNTER — Ambulatory Visit (HOSPITAL_COMMUNITY)
Admission: RE | Admit: 2022-09-29 | Discharge: 2022-09-29 | Disposition: A | Discharge status: Home / Self Care | Payer: Medicaid Other | Source: Ambulatory Visit | Attending: Cardiology | Admitting: Cardiology

## 2022-09-29 ENCOUNTER — Encounter: Payer: Self-pay | Admitting: Student

## 2022-09-29 ENCOUNTER — Other Ambulatory Visit (HOSPITAL_COMMUNITY): Payer: Self-pay | Admitting: Cardiology

## 2022-09-29 VITALS — BP 134/84 | HR 67 | Ht 72.0 in | Wt 347.0 lb

## 2022-09-29 DIAGNOSIS — I5032 Chronic diastolic (congestive) heart failure: Secondary | ICD-10-CM

## 2022-09-29 DIAGNOSIS — J9611 Chronic respiratory failure with hypoxia: Secondary | ICD-10-CM | POA: Diagnosis not present

## 2022-09-29 DIAGNOSIS — J449 Chronic obstructive pulmonary disease, unspecified: Secondary | ICD-10-CM

## 2022-09-29 LAB — BASIC METABOLIC PANEL
Anion gap: 8 (ref 5–15)
BUN: 24 mg/dL — ABNORMAL HIGH (ref 8–23)
CO2: 33 mmol/L — ABNORMAL HIGH (ref 22–32)
Calcium: 8.9 mg/dL (ref 8.9–10.3)
Chloride: 97 mmol/L — ABNORMAL LOW (ref 98–111)
Creatinine, Ser: 1.52 mg/dL — ABNORMAL HIGH (ref 0.61–1.24)
GFR, Estimated: 51 mL/min — ABNORMAL LOW (ref >60)
Glucose, Bld: 98 mg/dL (ref 70–99)
Potassium: 4.5 mmol/L (ref 3.5–5.1)
Sodium: 138 mmol/L (ref 135–145)

## 2022-09-29 MED ORDER — BREZTRI AEROSPHERE 160-9-4.8 MCG/ACT IN AERO: 2.0000 | INHALATION_SPRAY | Freq: Two times a day (BID) | RESPIRATORY_TRACT | 11 refills | Status: DC

## 2022-09-29 MED ORDER — ALBUTEROL SULFATE HFA 108 (90 BASE) MCG/ACT IN AERS: 2.0000 | INHALATION_SPRAY | RESPIRATORY_TRACT | 6 refills | Status: AC | PRN

## 2022-09-29 NOTE — Patient Instructions (Addendum)
-   Would continue breztri 2 puffs twice daily EVERYDAY, rinse mouth and brush tongue/teeth after each use - stop advair, spiriva - albuterol 1-2 puffs as needed up to 4-6 times daily - this is your RESCUE inhaler - replace your nasal cannula when it is full of water - See you in 3 months or sooner if need be!

## 2022-11-15 LAB — PULMONARY FUNCTION TEST
FEF 25-75 Pre: 0.88 L/sec
FEF2575-%Pred-Pre: 28 %
FEV1-%Pred-Pre: 28 %
FEV1-Pre: 1.11 L
FEV1FVC-%Pred-Pre: 101 %
FEV6-%Pred-Pre: 29 %
FEV6-Pre: 1.45 L
FEV6FVC-%Pred-Pre: 104 %
FVC-%Pred-Pre: 27 %
FVC-Pre: 1.45 L
Pre FEV1/FVC ratio: 76 %
Pre FEV6/FVC Ratio: 100 %

## 2022-11-18 ENCOUNTER — Encounter (HOSPITAL_COMMUNITY): Payer: Medicaid Other

## 2022-11-30 ENCOUNTER — Other Ambulatory Visit (HOSPITAL_COMMUNITY): Payer: Self-pay | Admitting: Cardiology

## 2022-12-28 ENCOUNTER — Other Ambulatory Visit (HOSPITAL_COMMUNITY): Payer: Self-pay | Admitting: Cardiology

## 2023-01-04 ENCOUNTER — Other Ambulatory Visit: Payer: Self-pay | Admitting: Adult Health

## 2023-01-10 ENCOUNTER — Other Ambulatory Visit: Payer: Self-pay

## 2023-01-10 ENCOUNTER — Emergency Department (HOSPITAL_COMMUNITY)
Admission: EM | Admit: 2023-01-10 | Discharge: 2023-01-11 | Disposition: A | Discharge status: Home / Self Care | Payer: Medicare Other | Attending: Emergency Medicine | Admitting: Emergency Medicine

## 2023-01-10 ENCOUNTER — Emergency Department (HOSPITAL_COMMUNITY): Payer: Medicare Other

## 2023-01-10 DIAGNOSIS — I509 Heart failure, unspecified: Secondary | ICD-10-CM | POA: Diagnosis not present

## 2023-01-10 DIAGNOSIS — R0602 Shortness of breath: Secondary | ICD-10-CM | POA: Diagnosis present

## 2023-01-10 DIAGNOSIS — J9611 Chronic respiratory failure with hypoxia: Secondary | ICD-10-CM

## 2023-01-10 LAB — CBC WITH DIFFERENTIAL/PLATELET
Abs Immature Granulocytes: 0.11 10*3/uL — ABNORMAL HIGH (ref 0.00–0.07)
Basophils Absolute: 0.1 10*3/uL (ref 0.0–0.1)
Basophils Relative: 1 %
Eosinophils Absolute: 0.5 10*3/uL (ref 0.0–0.5)
Eosinophils Relative: 7 %
HCT: 48.4 % (ref 39.0–52.0)
Hemoglobin: 14.5 g/dL (ref 13.0–17.0)
Immature Granulocytes: 2 %
Lymphocytes Relative: 36 %
Lymphs Abs: 2.6 10*3/uL (ref 0.7–4.0)
MCH: 28.3 pg (ref 26.0–34.0)
MCHC: 30 g/dL (ref 30.0–36.0)
MCV: 94.5 fL (ref 80.0–100.0)
Monocytes Absolute: 0.6 10*3/uL (ref 0.1–1.0)
Monocytes Relative: 9 %
Neutro Abs: 3.3 10*3/uL (ref 1.7–7.7)
Neutrophils Relative %: 45 %
Platelets: 231 10*3/uL (ref 150–400)
RBC: 5.12 MIL/uL (ref 4.22–5.81)
RDW: 15.4 % (ref 11.5–15.5)
WBC: 7.2 10*3/uL (ref 4.0–10.5)
nRBC: 0.4 % — ABNORMAL HIGH (ref 0.0–0.2)

## 2023-01-10 LAB — COMPREHENSIVE METABOLIC PANEL
ALT: 20 U/L (ref 0–44)
AST: 20 U/L (ref 15–41)
Albumin: 3.3 g/dL — ABNORMAL LOW (ref 3.5–5.0)
Alkaline Phosphatase: 61 U/L (ref 38–126)
Anion gap: 12 (ref 5–15)
BUN: 21 mg/dL (ref 8–23)
CO2: 35 mmol/L — ABNORMAL HIGH (ref 22–32)
Calcium: 9.1 mg/dL (ref 8.9–10.3)
Chloride: 92 mmol/L — ABNORMAL LOW (ref 98–111)
Creatinine, Ser: 1.48 mg/dL — ABNORMAL HIGH (ref 0.61–1.24)
GFR, Estimated: 52 mL/min — ABNORMAL LOW (ref >60)
Glucose, Bld: 117 mg/dL — ABNORMAL HIGH (ref 70–99)
Potassium: 4.6 mmol/L (ref 3.5–5.1)
Sodium: 139 mmol/L (ref 135–145)
Total Bilirubin: 0.3 mg/dL (ref 0.3–1.2)
Total Protein: 6.9 g/dL (ref 6.5–8.1)

## 2023-01-10 LAB — TROPONIN I (HIGH SENSITIVITY): Troponin I (High Sensitivity): 13 ng/L (ref <18)

## 2023-01-10 NOTE — ED Provider Notes (Signed)
[place] EMERGENCY DEPARTMENT AT [facility] Provider Note   CSN: [id number] Arrival date & time: 01/10/23  2242     History {Add pertinent medical, surgical, social history, OB history to HPI:1} Chief Complaint  Patient presents with   Shortness of Breath    Hx. CHF    Zachary Hawkins is a 67 y.o. male.  Patient presents to the emergency department for evaluation of shortness of breath.  Patient reports that he has a history of congestive heart failure and is chronically on oxygen at 3 L per nasal cannula.  He started to feel short of breath tonight.  EMS reports that his oxygen was not flowing at that time.  Patient without chest pain.  He feels better here on oxygen.       Home Medications Prior to Admission medications   Medication Sig Start Date End Date Taking? Authorizing Provider  albuterol (PROVENTIL) (2.5 MG/3ML) 0.083% nebulizer solution Take 3 mLs (2.5 mg total) by nebulization every 6 (six) hours as needed for wheezing or shortness of breath. 05/27/22 05/27/23  Parrett, Virgel Bouquet, NP  albuterol (VENTOLIN HFA) 108 (90 Base) MCG/ACT inhaler Inhale 2 puffs into the lungs every 4 (four) hours as needed for wheezing or shortness of breath. 09/29/22   Omar Person, MD  amLODipine (NORVASC) 2.5 MG tablet Take 1 tablet (2.5 mg total) by mouth daily. 08/19/22 08/14/23  Robbie Lis M, PA-C  atorvastatin (LIPITOR) 10 MG tablet TAKE 1 TABLET (10 MG TOTAL) BY MOUTH DAILY(AM) 12/29/22   Laurey Morale, MD  bisoprolol (ZEBETA) 5 MG tablet Take 1 tablet (5 mg total) by mouth daily. 08/19/22   Allayne Butcher, PA-C  Budeson-Glycopyrrol-Formoterol (BREZTRI AEROSPHERE) 160-9-4.8 MCG/ACT AERO Inhale 2 puffs into the lungs in the morning and at bedtime. 09/29/22   Omar Person, MD  ELIQUIS 5 MG TABS tablet TAKE 1 TABLET (5 MG TOTAL) BY MOUTH 2 (TWO) TIMES DAILY (AM+PM) 12/29/22   Laurey Morale, MD  EPINEPHrine 0.3 mg/0.3 mL IJ SOAJ injection Inject 0.3 mg into the  muscle as needed for anaphylaxis. 01/09/21   Osvaldo Shipper, MD  famotidine (PEPCID) 20 MG tablet Take 1 tablet (20 mg total) by mouth 2 (two) times daily. 01/17/21 04/05/42  Karie Fetch P, DO  FARXIGA 10 MG TABS tablet TAKE 1 TABLET (10 MG TOTAL) BY MOUTH DAILY (AM) 12/29/22   Laurey Morale, MD  fluticasone (FLONASE) 50 MCG/ACT nasal spray PLACE 1 SPRAY INTO BOTH NOSTRILS DAILY. 01/18/22   Omar Person, MD  mupirocin ointment (BACTROBAN) 2 % Place 1 application. into the nose 2 (two) times daily. Patient taking differently: Place 1 application  into the nose daily as needed. 10/11/21   Berton Mount I, MD  potassium chloride SA (KLOR-CON M) 20 MEQ tablet Take 1 tablet (20 mEq total) by mouth daily. 09/13/22   Laurey Morale, MD  saline (AYR) GEL Place 1 application into both nostrils at bedtime. Patient taking differently: Place 1 application  into both nostrils at bedtime as needed (for congestion). 07/09/21   Omar Person, MD  spironolactone (ALDACTONE) 25 MG tablet Take 2 tablets (50 mg total) by mouth daily. 09/13/22   Laurey Morale, MD  tamsulosin (FLOMAX) 0.4 MG CAPS capsule Take 1 capsule (0.4 mg total) by mouth daily after supper. 03/16/21   Pokhrel, Rebekah Chesterfield, MD  Tiotropium Bromide Monohydrate (SPIRIVA RESPIMAT) 2.5 MCG/ACT AERS INHALE 2 PUFFS INTO THE LUNGS DAILY. 01/06/23   Parrett, Virgel Bouquet,  NP  torsemide (DEMADEX) 20 MG tablet TAKE 3 TABLETS (60 MG TOTAL) BY MOUTH DAILY. 11/30/22   Laurey Morale, MD      Allergies    Ace inhibitors, Black cherry fruit extract [name]], and Conservation officer, nature agent]    Review of Systems   Review of Systems  Physical Exam Updated Vital Signs Temp 98 F (36.7 C) (Oral)  Physical Exam Vitals and nursing note reviewed.  Constitutional:      General: He is not in acute distress.    Appearance: He is well-developed.  HENT:     Head: Normocephalic and atraumatic.     Mouth/Throat:     Mouth: Mucous membranes are  moist.  Eyes:     General: Vision grossly intact. Gaze aligned appropriately.     Extraocular Movements: Extraocular movements intact.     Conjunctiva/sclera: Conjunctivae normal.  Cardiovascular:     Rate and Rhythm: Normal rate and regular rhythm.     Pulses: Normal pulses.     Heart sounds: Normal heart sounds, S1 normal and S2 normal. No murmur heard.    No friction rub. No gallop.  Pulmonary:     Effort: Tachypnea and accessory muscle usage present. No respiratory distress.     Breath sounds: Normal breath sounds.  Abdominal:     Palpations: Abdomen is soft.     Tenderness: There is no abdominal tenderness. There is no guarding or rebound.     Hernia: No hernia is present.  Musculoskeletal:        General: No swelling.     Cervical back: Full passive range of motion without pain, normal range of motion and neck supple. No pain with movement, spinous process tenderness or muscular tenderness. Normal range of motion.     Right lower leg: No edema.     Left lower leg: No edema.  Skin:    General: Skin is warm and dry.     Capillary Refill: Capillary refill takes less than 2 seconds.     Findings: No ecchymosis, erythema, lesion or wound.  Neurological:     Mental Status: He is alert and oriented to person, place, and time.     GCS: GCS eye subscore is 4. GCS verbal subscore is 5. GCS motor subscore is 6.     Cranial Nerves: Cranial nerves 2-12 are intact.     Sensory: Sensation is intact.     Motor: Motor function is intact. No weakness or abnormal muscle tone.     Coordination: Coordination is intact.  Psychiatric:        Mood and Affect: Mood normal.        Speech: Speech normal.        Behavior: Behavior normal.     ED Results / Procedures / Treatments   Labs (all labs ordered are listed, but only abnormal results are displayed) Labs Reviewed - No data to display  EKG None  Radiology No results found.  Procedures Procedures  {Document cardiac monitor,  telemetry assessment procedure when appropriate:1}  Medications Ordered in ED Medications - No data to display  ED Course/ Medical Decision Making/ A&P   {   Click here for ABCD2, HEART and other calculatorsREFRESH Note before signing :1}                              Medical Decision Making Amount and/or Complexity of Data Reviewed Labs: ordered. Radiology: ordered.   ***  {  Document critical care time when appropriate:1} {Document review of labs and clinical decision tools ie heart score, Chads2Vasc2 etc:1}  {Document your independent review of radiology images, and any outside records:1} {Document your discussion with family members, caretakers, and with consultants:1} {Document social determinants of health affecting pt's care:1} {Document your decision making why or why not admission, treatments were needed:1} Final Clinical Impression(s) / ED Diagnoses Final diagnoses:  None    Rx / DC Orders ED Discharge Orders     None

## 2023-01-10 NOTE — ED Triage Notes (Signed)
Patient arrived with EMS from home reports worsening SOB this evening , his oxygen machine is not working for several days , normally on 3 lpm/[place] , CBG=159. History of CHF . No chest pain .

## 2023-01-11 DIAGNOSIS — J9611 Chronic respiratory failure with hypoxia: Secondary | ICD-10-CM | POA: Diagnosis not present

## 2023-01-11 LAB — TROPONIN I (HIGH SENSITIVITY): Troponin I (High Sensitivity): 11 ng/L (ref <18)

## 2023-01-11 LAB — BRAIN NATRIURETIC PEPTIDE: B Natriuretic Peptide: 13.2 pg/mL (ref 0.0–100.0)

## 2023-01-11 NOTE — Discharge Planning (Signed)
RNCM consulted regarding home oxygen DME needing repairs.  RNCM contacted DME company Human resources officer) and spoke with rep, Jermaine, who will have a technician out for repairs.

## 2023-01-11 NOTE — Discharge Planning (Signed)
DME company will be here within the hour to speak with pt and troubleshoot O2 equipment.

## 2023-01-11 NOTE — ED Provider Notes (Signed)
  Physical Exam  BP (!) 145/92   Pulse 85   Temp 98.2 F (36.8 C) (Oral)   Resp 18   SpO2 95%   Physical Exam  Procedures  Procedures  ED Course / MDM    Medical Decision Making I was called to bedside around 5 pm. Patient came in last night for SOB and ran out of oxygen. He has oxygen delivered to the ED. Seen by social work. O2 is 95% on baseline 3 L [place]. Stable for discharge   Problems Addressed: Chronic respiratory failure with hypoxia [facility]): chronic illness or injury  Amount and/or Complexity of Data Reviewed Labs: ordered. Decision-making details documented in ED Course. Radiology: ordered and independent interpretation performed. Decision-making details documented in ED Course.          Charlynne Pander, MD 01/11/23 [phone]

## 2023-01-11 NOTE — Discharge Instructions (Signed)
Please keep your oxygen on at all times   See your doctor for follow up   Return to ER if you have worse shortness of breath, fever, cough

## 2023-01-11 NOTE — Discharge Planning (Signed)
RNCM called DME agency to ensure oxygen equipment had been inspected and that pt may return home.

## 2023-01-11 NOTE — ED Notes (Signed)
300 ml urine output

## 2023-01-27 ENCOUNTER — Other Ambulatory Visit (HOSPITAL_COMMUNITY): Payer: Self-pay

## 2023-04-04 ENCOUNTER — Other Ambulatory Visit (HOSPITAL_COMMUNITY): Payer: Self-pay | Admitting: Cardiology

## 2023-04-11 ENCOUNTER — Other Ambulatory Visit (HOSPITAL_COMMUNITY): Payer: Self-pay

## 2023-04-20 ENCOUNTER — Telehealth: Payer: Self-pay | Admitting: Adult Health

## 2023-04-20 NOTE — Telephone Encounter (Signed)
[employer] rec'd order for supplies but they did not rec the signed RX. Please submit.

## 2023-04-21 NOTE — Telephone Encounter (Signed)
It appears Dr. Thora Lance never signed DME order

## 2023-04-27 NOTE — Telephone Encounter (Signed)
PCC's- can you please see if there is something still needed on this pt?   I am assuming that another provider has signed the CMN

## 2023-04-29 NOTE — Telephone Encounter (Signed)
Msg to [facility] regarding CMN

## 2023-05-02 ENCOUNTER — Other Ambulatory Visit (HOSPITAL_COMMUNITY): Payer: Self-pay | Admitting: Cardiology

## 2023-05-02 NOTE — Telephone Encounter (Signed)
Received the following response from [place] with [employer]:  Message Received: 3 days ago New, Bonnita Nasuti, CMA; New, Senaida Lange,  Last note on the account 04/28/23 states we are in need of a Signed RX and Usage and benfit notes for pap, however this was for another provider.  I do not see any other recent notes for this patient.

## 2023-05-13 ENCOUNTER — Other Ambulatory Visit: Payer: Self-pay | Admitting: Otolaryngology

## 2023-05-25 NOTE — Telephone Encounter (Signed)
 Called [employer] who states patient has NIV. Called patient and he says Dola Frei is his 60 provider and not [employer]. Per the chart there were no orders placed to [employer]. He was informed that he needs a f/u with [facility] next available. He didn't receive recall reminder.Patient was driving and will call back to schedule f/u. NFN

## 2023-06-01 ENCOUNTER — Other Ambulatory Visit: Payer: Self-pay | Admitting: Adult Health

## 2023-06-06 ENCOUNTER — Encounter (HOSPITAL_COMMUNITY): Payer: Self-pay

## 2023-06-06 ENCOUNTER — Telehealth: Payer: Self-pay | Admitting: Cardiovascular Disease

## 2023-06-06 NOTE — Telephone Encounter (Signed)
   Pre-operative Risk Assessment    Patient Name: Zachary Hawkins  DOB: [birth year] MRN: [id number]   Date of last office visit: 05/27/2021 with Dr. Royann Shivers   Date of next office visit: nothing scheduled    Request for Surgical Clearance    Procedure:   Bilateral endoscopic sinus surgery with navigation   Date of Surgery:  Clearance 06/10/23                                Surgeon:  Dr. Marijo Conception Group or Practice Name:  [facility]  Phone number:  [phone] [name]  Fax number:  [phone]    Type of Clearance Requested:   - Medical  - Pharmacy:  Hold Apixaban (Eliquis)     Type of Anesthesia:  General    Additional requests/questions:    [name]   06/06/2023, 11:00 AM

## 2023-06-06 NOTE — Telephone Encounter (Signed)
Will route to PharmD for rec's re: holding anticoagulation.  Also pt is primarily followed in the [facility]. Per protocol, will route to CHF provider once PharmD rec's received. Tereso Newcomer, PA-C    06/06/2023 1:00 PM

## 2023-06-06 NOTE — Telephone Encounter (Signed)
Looks like low risk sinus surgery.  Would be ok to clear.

## 2023-06-06 NOTE — Pre-Procedure Instructions (Signed)
Surgical Instructions   Your procedure is scheduled on Thursday, February 6th. Report to [facility] Main Entrance "A" at 11:45 A.M., then check in with the Admitting office. Any questions or running late day of surgery: call [phone]  Questions prior to your surgery date: call [phone], Monday-Friday, 8am-4pm. If you experience any cold or flu symptoms such as cough, fever, chills, shortness of breath, etc. between now and your scheduled surgery, please notify Zachary Hawkins at the above number.     Remember:  Do not eat after midnight the night before your surgery   You may drink clear liquids until 10:45 AM the morning of your surgery.   Clear liquids allowed are: Water, Non-Citrus Juices (without pulp), Carbonated Beverages, Clear Tea (no milk, honey, etc.), Black Coffee Only (NO MILK, CREAM OR POWDERED CREAMER of any kind), and Gatorade.    Take these medicines the morning of surgery with A SIP OF WATER  amLODipine (NORVASC)  amoxicillin (AMOXIL)  atorvastatin (LIPITOR)  bisoprolol (ZEBETA)  famotidine (PEPCID)  fluticasone (FLONASE)  Tiotropium Bromide Monohydrate (SPIRIVA RESPIMAT)    May take these medicines IF NEEDED: albuterol (PROVENTIL)  albuterol (VENTOLIN HFA)- bring inhaler with you on day of surgery EPINEPHrine    Follow your surgeon's instructions on when to stop ELIQUIS.  If no instructions were given by your surgeon then you will need to call the office to get those instructions.    One week prior to surgery, STOP taking any Aspirin (unless otherwise instructed by your surgeon) Aleve, Naproxen, Ibuprofen, Motrin, Advil, Goody's, BC's, all herbal medications, fish oil, and non-prescription vitamins.  WHAT DO I DO ABOUT MY DIABETES MEDICATION?   Hold FARXIGA for 72 hours prior to surgery. Last dose 2/2.  Stop taking OZEMPIC 7 days prior to surgery. Last dose on or before 1/29.       HOW TO MANAGE YOUR DIABETES BEFORE AND AFTER SURGERY  Why is it important  to control my blood sugar before and after surgery? Improving blood sugar levels before and after surgery helps healing and can limit problems. A way of improving blood sugar control is eating a healthy diet by:  Eating less sugar and carbohydrates  Increasing activity/exercise  Talking with your doctor about reaching your blood sugar goals High blood sugars (greater than 180 mg/dL) can raise your risk of infections and slow your recovery, so you will need to focus on controlling your diabetes during the weeks before surgery. Make sure that the doctor who takes care of your diabetes knows about your planned surgery including the date and location.  How do I manage my blood sugar before surgery? Check your blood sugar at least 4 times a day, starting 2 days before surgery, to make sure that the level is not too high or low.  Check your blood sugar the morning of your surgery when you wake up and every 2 hours until you get to the Short Stay unit.  If your blood sugar is less than 70 mg/dL, you will need to treat for low blood sugar: Do not take insulin. Treat a low blood sugar (less than 70 mg/dL) with  cup of clear juice (cranberry or apple), 4 glucose tablets, OR glucose gel. Recheck blood sugar in 15 minutes after treatment (to make sure it is greater than 70 mg/dL). If your blood sugar is not greater than 70 mg/dL on recheck, call [phone] for further instructions. Report your blood sugar to the short stay nurse when you get to Short Stay.  If you are admitted to the hospital after surgery: Your blood sugar will be checked by the staff and you will probably be given insulin after surgery (instead of oral diabetes medicines) to make sure you have good blood sugar levels. The goal for blood sugar control after surgery is 80-180 mg/dL.                     Do NOT Smoke (Tobacco/Vaping) for 24 hours prior to your procedure.  If you use a CPAP at night, you may bring your mask/headgear  for your overnight stay.   You will be asked to remove any contacts, glasses, piercing's, hearing aid's, dentures/partials prior to surgery. Please bring cases for these items if needed.    Patients discharged the day of surgery will not be allowed to drive home, and someone needs to stay with them for 24 hours.  SURGICAL WAITING ROOM VISITATION Patients may have no more than 2 support people in the waiting area - these visitors may rotate.   Pre-op nurse will coordinate an appropriate time for 1 ADULT support person, who may not rotate, to accompany patient in pre-op.  Children under the age of 51 must have an adult with them who is not the patient and must remain in the main waiting area with an adult.  If the patient needs to stay at the hospital during part of their recovery, the visitor guidelines for inpatient rooms apply.  Please refer to the [facility] website for the visitor guidelines for any additional information.   If you received a COVID test during your pre-op visit  it is requested that you wear a mask when out in public, stay away from anyone that may not be feeling well and notify your surgeon if you develop symptoms. If you have been in contact with anyone that has tested positive in the last 10 days please notify you surgeon.      Pre-operative CHG Bathing Instructions   You can play a key role in reducing the risk of infection after surgery. Your skin needs to be as free of germs as possible. You can reduce the number of germs on your skin by washing with CHG (chlorhexidine gluconate) soap before surgery. CHG is an antiseptic soap that kills germs and continues to kill germs even after washing.   DO NOT use if you have an allergy to chlorhexidine/CHG or antibacterial soaps. If your skin becomes reddened or irritated, stop using the CHG and notify one of our RNs at [phone].              TAKE A SHOWER THE NIGHT BEFORE SURGERY AND THE DAY OF SURGERY    Please keep  in mind the following:  DO NOT shave, including legs and underarms, 48 hours prior to surgery.   You may shave your face before/day of surgery.  Place clean sheets on your bed the night before surgery Use a clean washcloth (not used since being washed) for each shower. DO NOT sleep with pet's night before surgery.  CHG Shower Instructions:  Wash your face and private area with normal soap. If you choose to wash your hair, wash first with your normal shampoo.  After you use shampoo/soap, rinse your hair and body thoroughly to remove shampoo/soap residue.  Turn the water OFF and apply half the bottle of CHG soap to a CLEAN washcloth.  Apply CHG soap ONLY FROM YOUR NECK DOWN TO YOUR TOES (washing for 3-5 minutes)  DO NOT use CHG soap on face, private areas, open wounds, or sores.  Pay special attention to the area where your surgery is being performed.  If you are having back surgery, having someone wash your back for you may be helpful. Wait 2 minutes after CHG soap is applied, then you may rinse off the CHG soap.  Pat dry with a clean towel  Put on clean pajamas    Additional instructions for the day of surgery: DO NOT APPLY any lotions, deodorants, cologne, or perfumes.   Do not wear jewelry or makeup Do not wear nail polish, gel polish, artificial nails, or any other type of covering on natural nails (fingers and toes) Do not bring valuables to the hospital. [facility] is not responsible for valuables/personal belongings. Put on clean/comfortable clothes.  Please brush your teeth.  Ask your nurse before applying any prescription medications to the skin.

## 2023-06-06 NOTE — Telephone Encounter (Signed)
Patient with diagnosis of afib on Eliquis for anticoagulation.    Procedure: Bilateral endoscopic sinus surgery with navigation  Date of procedure: 06/10/23   CHA2DS2-VASc Score = 3   This indicates a 3.2% annual risk of stroke. The patient's score is based upon: CHF History: 1 HTN History: 1 Diabetes History: 0 Stroke History: 0 Vascular Disease History: 0 Age Score: 1 Gender Score: 0      CrCl 76 ml/min Platelet count 231  Per office protocol, patient can hold Eliquis for 2 days prior to procedure.    **This guidance is not considered finalized until pre-operative APP has relayed final recommendations.**

## 2023-06-06 NOTE — Telephone Encounter (Signed)
Pt [facility] by [facility] - last seen in 08/2022. Per protocol will route to providers in [facility] for recommendations regarding surgical clearance. Tereso Newcomer, PA-C    06/06/2023 6:05 PM

## 2023-06-07 ENCOUNTER — Inpatient Hospital Stay (HOSPITAL_COMMUNITY)
Admission: RE | Admit: 2023-06-07 | Discharge: 2023-06-07 | Disposition: A | Discharge status: Home / Self Care | Payer: 59 | Source: Ambulatory Visit

## 2023-06-07 HISTORY — DX: Type 2 diabetes mellitus without complications: E11.9

## 2023-06-07 NOTE — Telephone Encounter (Signed)
   Patient Name: Zachary Hawkins  DOB: 29-Mar-1956 MRN: [id number]  Primary Cardiologist: Thurmon Fair, MD  Chart reviewed as part of pre-operative protocol coverage.  History also reviewed with Pharm.D. and Dr. Shirlee Latch.    Given past medical history and time since last visit, based on ACC/AHA guidelines, APOLLOS TENBRINK is at acceptable risk for the planned procedure without further cardiovascular testing.   Per office protocol, patient can hold Eliquis for 2 days prior to procedure.   I will route this recommendation to the requesting party via Epic fax function and remove from pre-op pool.  Please call with questions.  Tereso Newcomer, PA-C 06/07/2023, 11:25 AM

## 2023-06-07 NOTE — Telephone Encounter (Signed)
Notes faxed to surgeon. This phone note will be removed from the preop pool. Tereso Newcomer, PA-C  06/07/2023 11:26 AM

## 2023-06-07 NOTE — Pre-Procedure Instructions (Signed)
Surgical Instructions   Your procedure is scheduled on Thursday, February 6th. Report to [facility] Main Entrance "A" at 11:45 A.M., then check in with the Admitting office. Any questions or running late day of surgery: call [phone]  Questions prior to your surgery date: call [phone], Monday-Friday, 8am-4pm. If you experience any cold or flu symptoms such as cough, fever, chills, shortness of breath, etc. between now and your scheduled surgery, please notify us at the above number.     Remember:  Do not eat after midnight the night before your surgery   You may drink clear liquids until 10:45 AM the morning of your surgery.   Clear liquids allowed are: Water, Non-Citrus Juices (without pulp), Carbonated Beverages, Clear Tea (no milk, honey, etc.), Black Coffee Only (NO MILK, CREAM OR POWDERED CREAMER of any kind), and Gatorade.    Take these medicines the morning of surgery with A SIP OF WATER  amLODipine (NORVASC)  amoxicillin (AMOXIL)  atorvastatin (LIPITOR)  bisoprolol (ZEBETA)  famotidine (PEPCID)  fluticasone (FLONASE)  Tiotropium Bromide Monohydrate (SPIRIVA RESPIMAT)    May take these medicines IF NEEDED: albuterol (PROVENTIL)  albuterol (VENTOLIN HFA)- bring inhaler with you on day of surgery EPINEPHrine    Follow your surgeon's instructions on when to stop ELIQUIS.  If no instructions were given by your surgeon then you will need to call the office to get those instructions.    One week prior to surgery, STOP taking any Aspirin (unless otherwise instructed by your surgeon) Aleve, Naproxen, Ibuprofen, Motrin, Advil, Goody's, BC's, all herbal medications, fish oil, and non-prescription vitamins.  WHAT DO I DO ABOUT MY DIABETES MEDICATION?   Hold FARXIGA for 72 hours prior to surgery. Last dose 06/12/23.  Stop taking OZEMPIC 7 days prior to surgery. Last dose on or before 06/08/23.       HOW TO MANAGE YOUR DIABETES BEFORE AND AFTER SURGERY  Why is it  important to control my blood sugar before and after surgery? Improving blood sugar levels before and after surgery helps healing and can limit problems. A way of improving blood sugar control is eating a healthy diet by:  Eating less sugar and carbohydrates  Increasing activity/exercise  Talking with your doctor about reaching your blood sugar goals High blood sugars (greater than 180 mg/dL) can raise your risk of infections and slow your recovery, so you will need to focus on controlling your diabetes during the weeks before surgery. Make sure that the doctor who takes care of your diabetes knows about your planned surgery including the date and location.  How do I manage my blood sugar before surgery? Check your blood sugar at least 4 times a day, starting 2 days before surgery, to make sure that the level is not too high or low.  Check your blood sugar the morning of your surgery when you wake up and every 2 hours until you get to the Short Stay unit.  If your blood sugar is less than 70 mg/dL, you will need to treat for low blood sugar: Do not take insulin. Treat a low blood sugar (less than 70 mg/dL) with  cup of clear juice (cranberry or apple), 4 glucose tablets, OR glucose gel. Recheck blood sugar in 15 minutes after treatment (to make sure it is greater than 70 mg/dL). If your blood sugar is not greater than 70 mg/dL on recheck, call [phone] for further instructions. Report your blood sugar to the short stay nurse when you get to Short Stay.  If you are admitted to the hospital after surgery: Your blood sugar will be checked by the staff and you will probably be given insulin after surgery (instead of oral diabetes medicines) to make sure you have good blood sugar levels. The goal for blood sugar control after surgery is 80-180 mg/dL.                     Do NOT Smoke (Tobacco/Vaping) for 24 hours prior to your procedure.  If you use a CPAP at night, you may bring your  mask/headgear for your overnight stay.   You will be asked to remove any contacts, glasses, piercing's, hearing aid's, dentures/partials prior to surgery. Please bring cases for these items if needed.    Patients discharged the day of surgery will not be allowed to drive home, and someone needs to stay with them for 24 hours.  SURGICAL WAITING ROOM VISITATION Patients may have no more than 2 support people in the waiting area - these visitors may rotate.   Pre-op nurse will coordinate an appropriate time for 1 ADULT support person, who may not rotate, to accompany patient in pre-op.  Children under the age of 25 must have an adult with them who is not the patient and must remain in the main waiting area with an adult.  If the patient needs to stay at the hospital during part of their recovery, the visitor guidelines for inpatient rooms apply.  Please refer to the [facility] website for the visitor guidelines for any additional information.   If you received a COVID test during your pre-op visit  it is requested that you wear a mask when out in public, stay away from anyone that may not be feeling well and notify your surgeon if you develop symptoms. If you have been in contact with anyone that has tested positive in the last 10 days please notify you surgeon.      Pre-operative CHG Bathing Instructions   You can play a key role in reducing the risk of infection after surgery. Your skin needs to be as free of germs as possible. You can reduce the number of germs on your skin by washing with CHG (chlorhexidine gluconate) soap before surgery. CHG is an antiseptic soap that kills germs and continues to kill germs even after washing.   DO NOT use if you have an allergy to chlorhexidine/CHG or antibacterial soaps. If your skin becomes reddened or irritated, stop using the CHG and notify one of our RNs at [phone].              TAKE A SHOWER THE NIGHT BEFORE SURGERY AND THE DAY OF SURGERY     Please keep in mind the following:  DO NOT shave, including legs and underarms, 48 hours prior to surgery.   You may shave your face before/day of surgery.  Place clean sheets on your bed the night before surgery Use a clean washcloth (not used since being washed) for each shower. DO NOT sleep with pet's night before surgery.  CHG Shower Instructions:  Wash your face and private area with normal soap. If you choose to wash your hair, wash first with your normal shampoo.  After you use shampoo/soap, rinse your hair and body thoroughly to remove shampoo/soap residue.  Turn the water OFF and apply half the bottle of CHG soap to a CLEAN washcloth.  Apply CHG soap ONLY FROM YOUR NECK DOWN TO YOUR TOES (washing for 3-5 minutes)  DO NOT use CHG soap on face, private areas, open wounds, or sores.  Pay special attention to the area where your surgery is being performed.  If you are having back surgery, having someone wash your back for you may be helpful. Wait 2 minutes after CHG soap is applied, then you may rinse off the CHG soap.  Pat dry with a clean towel  Put on clean pajamas    Additional instructions for the day of surgery: DO NOT APPLY any lotions, deodorants, cologne, or perfumes.   Do not wear jewelry or makeup Do not wear nail polish, gel polish, artificial nails, or any other type of covering on natural nails (fingers and toes) Do not bring valuables to the hospital. [facility] is not responsible for valuables/personal belongings. Put on clean/comfortable clothes.  Please brush your teeth.  Ask your nurse before applying any prescription medications to the skin.

## 2023-06-07 NOTE — Progress Notes (Incomplete)
PCP - Madelaine Bhat McVey, PA-C Cardiologist - Dr Royann Shivers (clearance in EPIC on 06/06/23 HF Cardiologist - Dr Shirlee Latch  Chest x-ray - 01/10/23 EKG - 01/10/23 Stress Test - n/a ECHO - 08/19/22 Cardiac Cath - n/a  ICD Pacemaker/Loop - n/a  Sleep Study -  Yes CPAP - ***  Diabetes Type 2  Hold Farxiga for 72 hours prior to procedure.  Last dose will be on Sunday, 06/12/23   If your blood sugar is less than 70 mg/dL, you will need to treat for low blood sugar: Treat a low blood sugar (less than 70 mg/dL) with  cup of clear juice (cranberry or apple), 4 glucose tablets, OR glucose gel. Recheck blood sugar in 15 minutes after treatment (to make sure it is greater than 70 mg/dL). If your blood sugar is not greater than 70 mg/dL on recheck, call [phone] for further instructions.  Blood Thinner Instructions:  Follow your surgeon's instructions to hold Eliquis 2 days prior to surgery.  Last dose will be on Monday, 06/13/23  Aspirin Instructions: n/a  ERAS - PRE-SURGERY Ensure or G2-   Anesthesia review: Yes  STOP now taking any Aspirin (unless otherwise instructed by your surgeon), Aleve, Naproxen, Ibuprofen, Motrin, Advil, Goody's, BC's, all herbal medications, fish oil, and all vitamins.   Coronavirus Screening Do you have any of the following symptoms:  Cough {yes/no:20286:::1} Fever (>100.26F)  {yes/no:20286:::1} Runny nose {yes/no:20286:::1} Sore throat {yes/no:20286:::1} Difficulty breathing/shortness of breath  {yes/no:20286:::1}  Have you traveled in the last 14 days and where? {yes/no:20286:::1}  Patient verbalized understanding of instructions that were given to them at the PAT appointment. Patient was also instructed that they will need to review over the PAT instructions again at home before surgery.

## 2023-06-10 ENCOUNTER — Encounter (HOSPITAL_COMMUNITY): Payer: Self-pay

## 2023-06-10 ENCOUNTER — Other Ambulatory Visit: Payer: Self-pay

## 2023-06-10 ENCOUNTER — Telehealth: Payer: Self-pay | Admitting: Primary Care

## 2023-06-10 ENCOUNTER — Encounter (HOSPITAL_COMMUNITY)
Admission: RE | Admit: 2023-06-10 | Discharge: 2023-06-10 | Disposition: A | Discharge status: Home / Self Care | Payer: 59 | Source: Ambulatory Visit | Attending: Otolaryngology | Admitting: Otolaryngology

## 2023-06-10 VITALS — BP 99/82 | HR 97 | Temp 97.9°F | Resp 19 | Ht 73.0 in | Wt 352.0 lb

## 2023-06-10 DIAGNOSIS — I130 Hypertensive heart and chronic kidney disease with heart failure and stage 1 through stage 4 chronic kidney disease, or unspecified chronic kidney disease: Secondary | ICD-10-CM | POA: Insufficient documentation

## 2023-06-10 DIAGNOSIS — Z6841 Body Mass Index (BMI) 40.0 and over, adult: Secondary | ICD-10-CM | POA: Diagnosis not present

## 2023-06-10 DIAGNOSIS — Z7984 Long term (current) use of oral hypoglycemic drugs: Secondary | ICD-10-CM | POA: Diagnosis not present

## 2023-06-10 DIAGNOSIS — N189 Chronic kidney disease, unspecified: Secondary | ICD-10-CM | POA: Insufficient documentation

## 2023-06-10 DIAGNOSIS — J4489 Other specified chronic obstructive pulmonary disease: Secondary | ICD-10-CM | POA: Diagnosis not present

## 2023-06-10 DIAGNOSIS — Z7985 Long-term (current) use of injectable non-insulin antidiabetic drugs: Secondary | ICD-10-CM | POA: Insufficient documentation

## 2023-06-10 DIAGNOSIS — Z9981 Dependence on supplemental oxygen: Secondary | ICD-10-CM | POA: Insufficient documentation

## 2023-06-10 DIAGNOSIS — E785 Hyperlipidemia, unspecified: Secondary | ICD-10-CM | POA: Insufficient documentation

## 2023-06-10 DIAGNOSIS — J339 Nasal polyp, unspecified: Secondary | ICD-10-CM | POA: Insufficient documentation

## 2023-06-10 DIAGNOSIS — Z01818 Encounter for other preprocedural examination: Secondary | ICD-10-CM

## 2023-06-10 DIAGNOSIS — I420 Dilated cardiomyopathy: Secondary | ICD-10-CM | POA: Diagnosis not present

## 2023-06-10 DIAGNOSIS — I5032 Chronic diastolic (congestive) heart failure: Secondary | ICD-10-CM | POA: Insufficient documentation

## 2023-06-10 DIAGNOSIS — E1122 Type 2 diabetes mellitus with diabetic chronic kidney disease: Secondary | ICD-10-CM | POA: Insufficient documentation

## 2023-06-10 DIAGNOSIS — G4733 Obstructive sleep apnea (adult) (pediatric): Secondary | ICD-10-CM | POA: Diagnosis not present

## 2023-06-10 DIAGNOSIS — J324 Chronic pansinusitis: Secondary | ICD-10-CM | POA: Insufficient documentation

## 2023-06-10 DIAGNOSIS — I480 Paroxysmal atrial fibrillation: Secondary | ICD-10-CM | POA: Insufficient documentation

## 2023-06-10 DIAGNOSIS — Z01812 Encounter for preprocedural laboratory examination: Secondary | ICD-10-CM | POA: Insufficient documentation

## 2023-06-10 HISTORY — DX: Dependence on supplemental oxygen: Z99.81

## 2023-06-10 LAB — SURGICAL PCR SCREEN
MRSA, PCR: NEGATIVE
Staphylococcus aureus: NEGATIVE

## 2023-06-10 LAB — HEMOGLOBIN A1C
Hgb A1c MFr Bld: 6.5 % — ABNORMAL HIGH (ref 4.8–5.6)
Mean Plasma Glucose: 139.85 mg/dL

## 2023-06-10 LAB — CBC
HCT: 48.9 % (ref 39.0–52.0)
Hemoglobin: 14.7 g/dL (ref 13.0–17.0)
MCH: 29.4 pg (ref 26.0–34.0)
MCHC: 30.1 g/dL (ref 30.0–36.0)
MCV: 97.8 fL (ref 80.0–100.0)
Platelets: 218 10*3/uL (ref 150–400)
RBC: 5 MIL/uL (ref 4.22–5.81)
RDW: 14.7 % (ref 11.5–15.5)
WBC: 8 10*3/uL (ref 4.0–10.5)
nRBC: 0.2 % (ref 0.0–0.2)

## 2023-06-10 LAB — BASIC METABOLIC PANEL
Anion gap: 11 (ref 5–15)
BUN: 24 mg/dL — ABNORMAL HIGH (ref 8–23)
CO2: 36 mmol/L — ABNORMAL HIGH (ref 22–32)
Calcium: 9.8 mg/dL (ref 8.9–10.3)
Chloride: 90 mmol/L — ABNORMAL LOW (ref 98–111)
Creatinine, Ser: 1.65 mg/dL — ABNORMAL HIGH (ref 0.61–1.24)
GFR, Estimated: 46 mL/min — ABNORMAL LOW (ref >60)
Glucose, Bld: 126 mg/dL — ABNORMAL HIGH (ref 70–99)
Potassium: 4.8 mmol/L (ref 3.5–5.1)
Sodium: 137 mmol/L (ref 135–145)

## 2023-06-10 LAB — GLUCOSE, CAPILLARY: Glucose-Capillary: 209 mg/dL — ABNORMAL HIGH (ref 70–99)

## 2023-06-10 NOTE — Progress Notes (Signed)
Surgical Instructions     Your procedure is scheduled on Thursday, February 6th. Report to [facility] Main Entrance "A" at 11:45 A.M., then check in with the Admitting office. Any questions or running late day of surgery: call [phone]   Questions prior to your surgery date: call [phone], Monday-Friday, 8am-4pm. If you experience any cold or flu symptoms such as cough, fever, chills, shortness of breath, etc. between now and your scheduled surgery, please notify us at the above number.            Remember:       Do not eat after midnight the night before your surgery     You may drink clear liquids until 10:45 AM the morning of your surgery.   Clear liquids allowed are: Water, Non-Citrus Juices (without pulp), Carbonated Beverages, Clear Tea (no milk, honey, etc.), Black Coffee Only (NO MILK, CREAM OR POWDERED CREAMER of any kind), and Gatorade.          Take these medicines the morning of surgery with A SIP OF WATER  amLODipine (NORVASC)  atorvastatin (LIPITOR)  bisoprolol (ZEBETA)  famotidine (PEPCID)  fluticasone (FLONASE)  Tiotropium Bromide Monohydrate (SPIRIVA RESPIMAT)      May take these medicines IF NEEDED: albuterol (PROVENTIL)  albuterol (VENTOLIN HFA)- bring inhaler with you on day of surgery EPINEPHrine      Per your cardiologist's instructions, Eliquis should be held 2 days prior to your procedure.  Your last dose of Eliquis should be Monday, February 3rd, 2025.      One week prior to surgery, STOP taking any Aspirin (unless otherwise instructed by your surgeon) Aleve, Naproxen, Ibuprofen, Motrin, Advil, Goody's, BC's, all herbal medications, fish oil, and non-prescription vitamins.   WHAT DO I DO ABOUT MY DIABETES MEDICATION?     Hold FARXIGA for 72 hours prior to surgery. Last dose 2/2.   Stop taking OZEMPIC 7 days prior to surgery. Last dose on or before 1/29.                                                  HOW TO MANAGE YOUR DIABETES BEFORE  AND AFTER SURGERY   Why is it important to control my blood sugar before and after surgery? Improving blood sugar levels before and after surgery helps healing and can limit problems. A way of improving blood sugar control is eating a healthy diet by:  Eating less sugar and carbohydrates  Increasing activity/exercise  Talking with your doctor about reaching your blood sugar goals High blood sugars (greater than 180 mg/dL) can raise your risk of infections and slow your recovery, so you will need to focus on controlling your diabetes during the weeks before surgery. Make sure that the doctor who takes care of your diabetes knows about your planned surgery including the date and location.   How do I manage my blood sugar before surgery? Check your blood sugar at least 4 times a day, starting 2 days before surgery, to make sure that the level is not too high or low.   Check your blood sugar the morning of your surgery when you wake up and every 2 hours until you get to the Short Stay unit.   If your blood sugar is less than 70 mg/dL, you will need to treat for low blood sugar: Do not take insulin. Treat  a low blood sugar (less than 70 mg/dL) with  cup of clear juice (cranberry or apple), 4 glucose tablets, OR glucose gel. Recheck blood sugar in 15 minutes after treatment (to make sure it is greater than 70 mg/dL). If your blood sugar is not greater than 70 mg/dL on recheck, call [phone] for further instructions. Report your blood sugar to the short stay nurse when you get to Short Stay.   If you are admitted to the hospital after surgery: Your blood sugar will be checked by the staff and you will probably be given insulin after surgery (instead of oral diabetes medicines) to make sure you have good blood sugar levels. The goal for blood sugar control after surgery is 80-180 mg/dL.                     Do NOT Smoke (Tobacco/Vaping) for 24 hours prior to your procedure.   If you use a  CPAP at night, you may bring your mask/headgear for your overnight stay.   You will be asked to remove any contacts, glasses, piercing's, hearing aid's, dentures/partials prior to surgery. Please bring cases for these items if needed.    Patients discharged the day of surgery will not be allowed to drive home, and someone needs to stay with them for 24 hours.   SURGICAL WAITING ROOM VISITATION Patients may have no more than 2 support people in the waiting area - these visitors may rotate.   Pre-op nurse will coordinate an appropriate time for 1 ADULT support person, who may not rotate, to accompany patient in pre-op.  Children under the age of 45 must have an adult with them who is not the patient and must remain in the main waiting area with an adult.   If the patient needs to stay at the hospital during part of their recovery, the visitor guidelines for inpatient rooms apply.   Please refer to the [facility] website for the visitor guidelines for any additional information.     If you received a COVID test during your pre-op visit  it is requested that you wear a mask when out in public, stay away from anyone that may not be feeling well and notify your surgeon if you develop symptoms. If you have been in contact with anyone that has tested positive in the last 10 days please notify you surgeon.         Pre-operative CHG Bathing Instructions    You can play a key role in reducing the risk of infection after surgery. Your skin needs to be as free of germs as possible. You can reduce the number of germs on your skin by washing with CHG (chlorhexidine gluconate) soap before surgery. CHG is an antiseptic soap that kills germs and continues to kill germs even after washing.    DO NOT use if you have an allergy to chlorhexidine/CHG or antibacterial soaps. If your skin becomes reddened or irritated, stop using the CHG and notify one of our RNs at [phone].               TAKE A SHOWER THE  NIGHT BEFORE SURGERY AND THE DAY OF SURGERY     Please keep in mind the following:  DO NOT shave, including legs and underarms, 48 hours prior to surgery.   You may shave your face before/day of surgery.  Place clean sheets on your bed the night before surgery Use a clean washcloth (not used since being washed)  for each shower. DO NOT sleep with pet's night before surgery.   CHG Shower Instructions:  Wash your face and private area with normal soap. If you choose to wash your hair, wash first with your normal shampoo.  After you use shampoo/soap, rinse your hair and body thoroughly to remove shampoo/soap residue.  Turn the water OFF and apply half the bottle of CHG soap to a CLEAN washcloth.  Apply CHG soap ONLY FROM YOUR NECK DOWN TO YOUR TOES (washing for 3-5 minutes)  DO NOT use CHG soap on face, private areas, open wounds, or sores.  Pay special attention to the area where your surgery is being performed.  If you are having back surgery, having someone wash your back for you may be helpful. Wait 2 minutes after CHG soap is applied, then you may rinse off the CHG soap.  Pat dry with a clean towel  Put on clean pajamas     Additional instructions for the day of surgery: DO NOT APPLY any lotions, deodorants, cologne, or perfumes.   Do not wear jewelry or makeup Do not wear nail polish, gel polish, artificial nails, or any other type of covering on natural nails (fingers and toes) Do not bring valuables to the hospital. [facility] is not responsible for valuables/personal belongings. Put on clean/comfortable clothes.  Please brush your teeth.  Ask your nurse before applying any prescription medications to the skin.

## 2023-06-10 NOTE — Telephone Encounter (Signed)
Fax received from Dr. Christia Reading with [facility] to perform a bilateral endoscopicsinus surgery with navigation on patient.  Patient needs surgery clearance. Surgery is 06/16/23. Patient was seen on 09/29/22. Office protocol is a risk assessment can be sent to surgeon if patient has been seen in 60 days or less.   I spoke with the pt and notified will need appt for risk assessment  I have him scheduled with Buelah Manis for Monday 06/13/23  Louis Meckel at ENT and left detailed msg on her VM letting her know   Will hold in clearance basket

## 2023-06-10 NOTE — Progress Notes (Addendum)
   Patient was given pre-op instructions. The opportunity was given for the patient to ask questions. No further questions asked. Patient verbalized understanding of instructions given.   PCP - [facility] Cardiologist - Dr Shirlee Latch Pulmonologist - Dr. Delton Coombes - schedule to see in March  PPM/ICD - denies Device Orders - n/a Rep Notified - n/a  Chest x-ray - 01/10/23 EKG - 01/10/23 Stress Test - denies ECHO - 08/19/22 Cardiac Cath - denies  Sleep Study - OSA+ CPAP - does not wear a CPAP at night - wears oxygen 24/7 - currently on 3L [place]  Patient states that he has not had a change in his breathing  Patient does not check his blood sugars at home  Last dose of GLP1 agonist-  last dose of Ozempic was 1/29 - Patient instructed not to take Ozempic.    Blood Thinner Instructions:  Per cardiologist, Eliquis should be stopped 2 days prior to surgery. Last dose of Eliquis should be Monday, February 3rd.   ERAS Protcol - clears until 1045   COVID TEST- n/a   Anesthesia review: yes  Patient denies shortness of breath, fever, cough and chest pain over the phone call   All instructions explained to the patient, with a verbal understanding of the material. Patient agrees to go over the instructions while at home for a better understanding.     During PAT appointment, patient was educated on which pills to take and which pills to stop.  Patient was shown pictures of his medication and given the numbers that are found on the medication.  Patient verbalized understanding.

## 2023-06-13 ENCOUNTER — Encounter: Payer: Self-pay | Admitting: Primary Care

## 2023-06-13 ENCOUNTER — Ambulatory Visit (INDEPENDENT_AMBULATORY_CARE_PROVIDER_SITE_OTHER): Payer: 59 | Admitting: Primary Care

## 2023-06-13 VITALS — BP 136/76 | HR 100 | Temp 98.7°F | Ht 72.0 in | Wt 360.0 lb

## 2023-06-13 DIAGNOSIS — G4733 Obstructive sleep apnea (adult) (pediatric): Secondary | ICD-10-CM

## 2023-06-13 DIAGNOSIS — Z01811 Encounter for preprocedural respiratory examination: Secondary | ICD-10-CM

## 2023-06-13 DIAGNOSIS — J9611 Chronic respiratory failure with hypoxia: Secondary | ICD-10-CM | POA: Diagnosis not present

## 2023-06-13 NOTE — Progress Notes (Signed)
Anesthesia Chart Review:  Case: [id number] Date/Time: 06/16/23 1330   Procedures:      ENDOSCOPIC SINUS SURGERY WITH NAVIGATION (Bilateral)     TOTAL ETHMOIDECTOMY (Bilateral)     MAXILLARY ANTROSTOMY WITH TISSUE REMOVAL (Bilateral)     NASAL FRONTAL SINUS EXPLORATION (Bilateral)     SPHENOIDOTOMY (Bilateral)   Anesthesia type: General   Pre-op diagnosis: Chronic pansinusitis; Nasal polyposis; obstructive sleep apnea   [facility] ROOM 09 / [facility] OR   Surgeons: Christia Reading, MD       DISCUSSION: Patient is a 68 year old male scheduled for the above procedure.  History includes former smoker (quit 01/08/21), HTN, HLD, dilated cardiomyopathy (EF 40-45% 06/06/17, likely related to sever HTN, EF 60-65% 01/08/21), chronic diastolic CHF, PAF, DM2, CKD, COPD, home O2 (3L continuous), asthma, OSA (not using CPAP), BPH, angioedema (ACEi related 10/2017). Anaphylaxis with black cherry fruit extract or grenadine flavoring.   He had pulmonology preoperative risk assessment on 06/13/23 with Ames Dura, NP. He is on home O2 at 3L. He uses [place] and as needed albuterol. No recent respiratory exacerbations. He expressed desired to trial CPAP or BiPAP again due to poor sleep. A repeat sleep study ordered. In regards to surgery she wrote, "He is optimized for surgery from pulmonary standpoint, he will be considered intermediate risk for prolonged mechanical ventilation and/or post op pulmonary complications due to his history of chronic respiratory failure and sleep apnea. Recommend surgery be done in in-patient settings. Advise patient to stay active postoperatively to prevent blood clots and respiratory complications. Ensure patient understands the use of incentive spirometer if provided postoperatively. Ensure surgical team is aware of patient's oxygen use."   Last HF office follow-up was on 08/19/22 with Robbie Lis, PA-C for routine chronic diastolic CHF visit with echo. Multiple admission in 2023 for  HF exacerbations. Classified as NYHA II-early III, confounded by super morbid obesity, deconditioning, COPD and suspected OHS.Denied chest pain. Weight was down 4 lbs. Compliant with medicaiton, but admitted to dietary indiscretion. BP 140/90. On torsemide, bisoprolol, Farxiga, spironolactone. Not a candidate for ARNI due to angioedema with ACEi. Amlodipine added for BP control. Bisoprolol increased to 5 mg daily. Eliquis continued for PAF. He had been recently started on Ozempic for weight loss. 08/19/22 echo showed EF 60-65%, severe LVH, no significant valvular abnormalities, RV not well visualized. IVC not dilated. He had PharmD follow-up on 09/13/22, spironolactone increased to 50 mg daily. APP follow-up had been planned for July 2024. Dr. Jenne Pane' did reach out to cardiology for preoperative cardiology input. Per 06/07/23 notation by Tereso Newcomer, PA-C, "History also reviewed with Pharm.D. and Dr. Shirlee Latch.     Given past medical history and time since last visit, based on ACC/AHA guidelines, Zachary Hawkins is at acceptable risk for the planned procedure without further cardiovascular testing.    Per office protocol, patient can hold Eliquis for 2 days prior to procedure."   Last Ozempic 06/08/23 and instructed to hold until after surgery. Last Fargixa planned for 06/12/23. Last Eliquis planned for 06/13/23.  Anesthesia team to evaluate on the day of surgery.    VS: BP 99/82   Pulse 97   Temp 36.6 C   Resp 19   Ht 6\' 1"  (1.854 m)   Wt (!) 159.7 kg   SpO2 92%   BMI 46.44 kg/m    PROVIDERS: McVey, Madelaine Bhat, PA-C is PCP [facility]) Croitoru, Rachelle Hora, MD is primary cardiologist, although last visit 05/27/21 and now is  primarily followed at the [facility].  Marca Ancona, MD is HF cardiologist Felisa Bonier, MD is pulmonologist   LABS: Preoperative labs noted. A1c 6.5%. CBC normal. Creatinine 1.65, eGFR 46, BUN 24, glucose 126, K 4.8. Previous Creatinine in May and September 2024  was 1.48-1.52, eGRF 51-52.  (all labs ordered are listed, but only abnormal results are displayed)  Labs Reviewed  GLUCOSE, CAPILLARY - Abnormal; Notable for the following components:      Result Value   Glucose-Capillary 209 (*)    All other components within normal limits  HEMOGLOBIN A1C - Abnormal; Notable for the following components:   Hgb A1c MFr Bld 6.5 (*)    All other components within normal limits  BASIC METABOLIC PANEL - Abnormal; Notable for the following components:   Chloride 90 (*)    CO2 36 (*)    Glucose, Bld 126 (*)    BUN 24 (*)    Creatinine, Ser 1.65 (*)    GFR, Estimated 46 (*)    All other components within normal limits  SURGICAL PCR SCREEN  CBC    Spirometry 11/15/22:  Latest Reference Range & Units 11/15/22 10:20  FVC-Pre L 1.45  FVC-%Pred-Pre % 27  FEV1-Pre L 1.11  FEV1-%Pred-Pre % 28  Pre FEV1/FVC ratio % 76  FEV1FVC-%Pred-Pre % 101  FEF 25-75 Pre L/sec 0.88  FEF2575-%Pred-Pre % 28  FEV6-Pre L 1.45  FEV6-%Pred-Pre % 29  Pre FEV6/FVC Ratio % 100  FEV6FVC-%Pred-Pre % 104     IMAGES: CXR 1V 01/10/23: IMPRESSION: Unchanged cardiomegaly. Peribronchial thickening which is likely chronic. Streaky right lung base atelectasis or scarring.   CT Facial Bones 11/30/22 ([facility]): 1. Widespread sinonasal opacification with polyposis. Left maxillary  sinus opacification is progressed since 2023.  2. Thinning of the posterior wall right frontal sinus also seen on  head CT from 2023.  3. Left ethmoid osteoma.    EKG: 01/10/23: NSR   CV: Echo 08/19/22: IMPRESSIONS   1. Left ventricular ejection fraction, by estimation, is 60 to 65%. The  left ventricle has normal function. Left ventricular endocardial border  not optimally defined to evaluate regional wall motion. There is severe  concentric left ventricular  hypertrophy. Left ventricular diastolic parameters are consistent with  Grade I diastolic dysfunction (impaired relaxation).   2. Right  ventricular systolic function was not well visualized. The right  ventricular size is not well visualized.   3. The mitral valve is grossly normal. No evidence of mitral valve  regurgitation.   4. The aortic valve was not well visualized. Aortic valve regurgitation  is not visualized. No aortic stenosis is present.   5. Aortic dilatation noted. There is mild dilatation of the ascending  aorta, measuring 41 mm.   6. The inferior vena cava is normal in size with greater than 50%  respiratory variability, suggesting right atrial pressure of 3 mmHg.  - Comparison(s): Prior images reviewed side by side. Aorta has increased in  size. This study is more technically difficult from prior.     Past Medical History:  Diagnosis Date   Asthma    BPH (benign prostatic hyperplasia)    Cardiomyopathy (HCC) 06/2017   EF 40-45%   CHF (congestive heart failure) (HCC)    Chronic renal insufficiency, stage 3 (moderate) (HCC)    COPD (chronic obstructive pulmonary disease) (HCC)    Diabetes mellitus without complication (HCC)    Diastolic dysfunction 06/2017   grade 2    Hyperlipidemia    Hypertension  Hypertensive urgency    Obesity    Obesity    OSA (obstructive sleep apnea)    Oxygen dependent    Paroxysmal atrial fibrillation (HCC) 2022   on Eliquis    History reviewed. No pertinent surgical history.  MEDICATIONS:  albuterol (PROVENTIL) (2.5 MG/3ML) 0.083% nebulizer solution   albuterol (VENTOLIN HFA) 108 (90 Base) MCG/ACT inhaler   amLODipine (NORVASC) 2.5 MG tablet   amoxicillin (AMOXIL) 875 MG tablet   atorvastatin (LIPITOR) 10 MG tablet   atorvastatin (LIPITOR) 20 MG tablet   bisoprolol (ZEBETA) 5 MG tablet   ELIQUIS 5 MG TABS tablet   EPINEPHrine 0.3 mg/0.3 mL IJ SOAJ injection   famotidine (PEPCID) 20 MG tablet   FARXIGA 10 MG TABS tablet   fluticasone (FLONASE) 50 MCG/ACT nasal spray   OZEMPIC, 0.25 OR 0.5 MG/DOSE, 2 MG/3ML SOPN   potassium chloride SA (KLOR-CON M) 20 MEQ  tablet   spironolactone (ALDACTONE) 25 MG tablet   Tiotropium Bromide Monohydrate (SPIRIVA RESPIMAT) 2.5 MCG/ACT AERS   torsemide (DEMADEX) 20 MG tablet   No current facility-administered medications for this encounter.    Shonna Chock, PA-C Surgical Short Stay/Anesthesiology [facility] Phone [phone] [facility] Phone [phone] 06/14/2023 10:09 AM

## 2023-06-13 NOTE — Progress Notes (Signed)
@Patient  ID: Duwaine Maxin, male    DOB: 04/09/1956, 68 y.o.   MRN: [id number]  Chief Complaint  Patient presents with   Follow-up    Referring provider: Newt Minion*  HPI: 42JW with history of CHF (EF 40-45% now recovered, G2DD -> G1DD), moderate OSA not on CPAP since he couldn't afford it, AF on eliquis, obesity, smoking 15-20 years half ppd quit 01/04/21, ACEi angioedema 10/2017, anaphylaxis due to black cherries requiring hospitalization and intubation dc'd home with steroid taper 9/2, recent discharge from ICU 9/10 after giving himself an epi pen injection 9/7 after he felt his throat closing up after dinner. He was referred to allergy/immunology.   Previous [facility] pulmonary encounter: 09/29/22 He has some DOE. Taking advair one puff BID. No cough especially since stopping smoking.   He never had any itching during either of the above episodes. Tryptase was negative.   He is finishing course of steroids.   Snores. Some PND. Some daytime sleepiness.   He had an episode yesterday where home oxygen cut off and it worried him.   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   Interval HPI  Since last visit transitioned to breztri  At last cardiology appt, bisoprolol increased to 5mg  daily, amlodipine 2.5mg  daily started. No change to torsemide. Pharmacy increased his aldactone to 50 mg daily.  Has been started on ozempic.  POC with alarm for not detecting breath. Looks like his nasal cannula is filled with water.  He says he is taking advair as needed. He also says he is taking a green inhaler. I don't know where he got these inhalers. He has not needed any prednisone since last visit.   Otherwise pertinent review of systems is negative.  06/13/2023- Interim hx  Discussed the use of AI scribe software for clinical note transcription with the patient, who gave verbal consent to proceed.  Patient presents today for surgical risk  assessment.  Patient has a history of chronic respiratory failure, OSA and suspected asthma/COPD (unable to perform spirometry). Patient is planning to undergo endoscopic sinus surgery with navigation with Dr. Jenne Pane on 06/16/23 in hospital setting.    The patient is scheduled for sinus surgery on Thursday and requires a pre-operative evaluation. They are currently using a portable oxygen concentrator and are on 3 liters of oxygen continuously. They aim to improve their physical activity level, specifically wanting to walk to the end of their block without assistance.  They have a history of respiratory issues, including COPD, and are using Spiriva once daily, typically in the morning, and occasionally use albuterol inhalers as needed. No current breathing issues, wheezing, or bronchitis symptoms are reported. They experience occasional shortness of breath when sitting but none at the moment.  They have a history of sleep apnea but are not currently using a CPAP or BiPAP machine. They want to resume using a sleep device to improve their sleep quality, as they are experiencing poor sleep and frequent awakenings at night.  They are open to repeat sleep study. They have never had anesthesia before.  Their current medications include Norvasc (amlodipine) for blood pressure, atorvastatin for cholesterol, and torsemide as a diuretic. They are also on Ozempic for weight loss and have been advised to hold certain medications, including Eliquis and Farxiga, prior to surgery. They are uncertain about the specific pills in their medication pack and seek clarification on which medications to hold before surgery.  Allergies  Allergen Reactions   Ace Inhibitors Anaphylaxis   Black Cherry Fruit Extract [name]] Anaphylaxis    Tongue   Grenadine Flavor [Flavoring Agent (Non-Screening)] Anaphylaxis    Immunization History  Administered Date(s) Administered   PFIZER(Purple Top)SARS-COV-2 Vaccination  11/12/2019, 12/05/2019, 03/18/2020   Pfizer Covid-19 Vaccine Bivalent Booster 53yrs & up 03/16/2021   Td 04/22/2011   Tdap 11/26/2020   Zoster Recombinant(Shingrix) 11/26/2020    Past Medical History:  Diagnosis Date   Asthma    BPH (benign prostatic hyperplasia)    Cardiomyopathy (HCC) 06/2017   EF 40-45%   CHF (congestive heart failure) (HCC)    Chronic renal insufficiency, stage 3 (moderate) (HCC)    COPD (chronic obstructive pulmonary disease) (HCC)    Diabetes mellitus without complication (HCC)    Diastolic dysfunction 06/2017   grade 2    Hyperlipidemia    Hypertension    Hypertensive urgency    Obesity    Obesity    OSA (obstructive sleep apnea)    Oxygen dependent    Paroxysmal atrial fibrillation (HCC) 2022   on Eliquis    Tobacco History: Social History   Tobacco Use  Smoking Status Former   Current packs/day: 0.00   Average packs/day: 1 pack/day for 36.0 years (36.0 ttl pk-yrs)   Types: Cigarettes   Start date: 01/08/1985   Quit date: 01/08/2021   Years since quitting: 2.4  Smokeless Tobacco Never   Counseling given: Not Answered   Outpatient Medications Prior to Visit  Medication Sig Dispense Refill   albuterol (VENTOLIN HFA) 108 (90 Base) MCG/ACT inhaler Inhale 2 puffs into the lungs every 4 (four) hours as needed for wheezing or shortness of breath. 18 g 6   amLODipine (NORVASC) 2.5 MG tablet Take 1 tablet (2.5 mg total) by mouth daily. 90 tablet 3   amoxicillin (AMOXIL) 875 MG tablet Take 875 mg by mouth 2 (two) times daily.     atorvastatin (LIPITOR) 10 MG tablet TAKE 1 TABLET (10 MG TOTAL) BY MOUTH DAILY(AM) 90 tablet 3   atorvastatin (LIPITOR) 20 MG tablet Take 20 mg by mouth daily.     bisoprolol (ZEBETA) 5 MG tablet Take 1 tablet (5 mg total) by mouth daily. 90 tablet 3   ELIQUIS 5 MG TABS tablet TAKE 1 TABLET (5 MG TOTAL) BY MOUTH 2 (TWO) TIMES DAILY (AM+PM) 60 tablet 11   EPINEPHrine 0.3 mg/0.3 mL IJ SOAJ injection Inject 0.3 mg into the  muscle as needed for anaphylaxis. 1 each 1   famotidine (PEPCID) 20 MG tablet Take 1 tablet (20 mg total) by mouth 2 (two) times daily. 28 tablet 0   FARXIGA 10 MG TABS tablet TAKE 1 TABLET (10 MG TOTAL) BY MOUTH DAILY (AM) 30 tablet 11   fluticasone (FLONASE) 50 MCG/ACT nasal spray PLACE 1 SPRAY INTO BOTH NOSTRILS DAILY. (Patient taking differently: Place 2 sprays into both nostrils at bedtime.) 16 g 11   OZEMPIC, 0.25 OR 0.5 MG/DOSE, 2 MG/3ML SOPN Inject 0.5 mg into the skin once a week. Wednesday     potassium chloride SA (KLOR-CON M) 20 MEQ tablet TAKE 1 TABLET (20 MEQ TOTAL) BY MOUTH DAILY (AM) 30 tablet 3   spironolactone (ALDACTONE) 25 MG tablet TAKE 2 TABLETS (50 MG TOTAL) BY MOUTH DAILY (2AM) 90 tablet 3   Tiotropium Bromide Monohydrate (SPIRIVA RESPIMAT) 2.5 MCG/ACT AERS INHALE 2 PUFFS INTO THE LUNGS DAILY. 4 g 3   torsemide (DEMADEX) 20 MG tablet TAKE 3 TABLETS (60 MG TOTAL)  BY MOUTH DAILY. (Patient taking differently: Take 60 mg by mouth daily.) 180 tablet 3   albuterol (PROVENTIL) (2.5 MG/3ML) 0.083% nebulizer solution Take 3 mLs (2.5 mg total) by nebulization every 6 (six) hours as needed for wheezing or shortness of breath. 75 mL 5   No facility-administered medications prior to visit.    Review of Systems  Review of Systems  Constitutional: Negative.   HENT:  Positive for congestion.   Respiratory:  Positive for shortness of breath. Negative for cough and wheezing.   Cardiovascular: Negative.     Physical Exam  BP 136/76 (BP Location: Left Arm, Cuff Size: Large)   Pulse 100   SpO2 96%  Physical Exam Constitutional:      Appearance: Normal appearance.  HENT:     Mouth/Throat:     Mouth: Mucous membranes are moist.     Pharynx: Oropharynx is clear.  Cardiovascular:     Rate and Rhythm: Normal rate and regular rhythm.  Pulmonary:     Effort: Pulmonary effort is normal.     Breath sounds: Normal breath sounds.     Comments: CTA; POC 3L Musculoskeletal:         General: Normal range of motion.  Skin:    General: Skin is warm and dry.  Neurological:     General: No focal deficit present.     Mental Status: He is alert and oriented to person, place, and time. Mental status is at baseline.  Psychiatric:        Mood and Affect: Mood normal.        Behavior: Behavior normal.        Thought Content: Thought content normal.        Judgment: Judgment normal.      Lab Results:  CBC    Component Value Date/Time   WBC 8.0 06/10/2023 0930   RBC 5.00 06/10/2023 0930   HGB 14.7 06/10/2023 0930   HGB 16.7 07/11/2020 1103   HCT 48.9 06/10/2023 0930   HCT 50.3 07/11/2020 1103   PLT 218 06/10/2023 0930   PLT 232 07/11/2020 1103   MCV 97.8 06/10/2023 0930   MCV 88 07/11/2020 1103   MCH 29.4 06/10/2023 0930   MCHC 30.1 06/10/2023 0930   RDW 14.7 06/10/2023 0930   RDW 13.6 07/11/2020 1103   LYMPHSABS 2.6 01/10/2023 2319   MONOABS 0.6 01/10/2023 2319   EOSABS 0.5 01/10/2023 2319   BASOSABS 0.1 01/10/2023 2319    BMET    Component Value Date/Time   NA 137 06/10/2023 0930   NA 146 (H) 10/15/2021 1225   K 4.8 06/10/2023 0930   CL 90 (L) 06/10/2023 0930   CO2 36 (H) 06/10/2023 0930   GLUCOSE 126 (H) 06/10/2023 0930   BUN 24 (H) 06/10/2023 0930   BUN 16 10/15/2021 1225   CREATININE 1.65 (H) 06/10/2023 0930   CALCIUM 9.8 06/10/2023 0930   GFRNONAA 46 (L) 06/10/2023 0930   GFRAA 56 (L) 08/02/2019 0718    BNP    Component Value Date/Time   BNP 13.2 01/10/2023 2319    ProBNP No results found for: "PROBNP"  Imaging: No results found.   Assessment & Plan:   1. Chronic hypoxemic respiratory failure (HCC) (Primary) - Split night study; Future  2. OSA (obstructive sleep apnea) - Split night study; Future  3. Pre-operative respiratory examination     Pre-op respiratory exam  Patient is scheduled for endoscopic sinus surgery on Thursday with Dr. Jenne Pane. He is optimized for  surgery from pulmonary standpoint, he will be considered  intermediate risk for prolonged mechanical ventilation and/or post op pulmonary complications due to his history of chronic respiratory failure and sleep apnea. Recommend surgery be done in in-patient settings. Advise patient to stay active postoperatively to prevent blood clots and respiratory complications. Ensure patient understands the use of incentive spirometer if provided postoperatively. Ensure surgical team is aware of patient's oxygen use.  Sleep Apnea Patient reports poor sleep quality and desires to return to CPAP/BiPAP use. Last sleep study was some time ago. -Order a split night sleep study to be done in sleep lab due to respiratory failure on oxygen -Consider re-prescribing CPAP/BiPAP pending sleep study results.  Chronic Obstructive Pulmonary Disease (COPD)/ Chronic respiratory failure  Patient is on 3L of oxygen continuously and uses Spiriva twice daily. Reports occasional shortness of breath when sitting and previous wheezing episode. No recent respiratory illness or acute exacerbations -Advise patient to use Spiriva once daily. -Consider physical therapy referral to improve exercise tolerance and potentially reduce oxygen dependence.  Perioperative Anticoagulation Patient is on Eliquis and has upcoming sinus surgery. -Advise patient to take last dose of Eliquis tonight and hold until after surgery.  Medication Reconciliation Patient has multiple medications in pill pack, including Eliquis, Farxiga, atorvastatin, and torsemide. Some confusion about which medications to hold preoperatively. -Advise patient to discuss with primary care provider about dosing of atorvastatin and torsemide  -Confirm with patient to hold Farxiga 72 hours before surgery.   Glenford Bayley, NP 06/13/2023

## 2023-06-13 NOTE — Patient Instructions (Addendum)
-  SINUS SURGERY: You are scheduled for sinus surgery on Thursday. You will be considered intermediate risk for prolonged mech ventilation due to history of chronic respiratory failure, OSA and suspected COPD.  Advise surgery be done in hospital setting.  -SLEEP APNEA: Sleep apnea is a condition where your breathing stops and starts during sleep. We will order a new sleep study to reassess your condition and consider re-prescribing a CPAP or BiPAP machine based on the results.  -CHRONIC OBSTRUCTIVE PULMONARY DISEASE (COPD): COPD is a chronic lung condition that makes it hard to breathe. Continue using Spiriva two puffs once daily. We may refer you to physical therapy to help improve your exercise tolerance and potentially reduce your need for continuous oxygen.  -PERIOPERATIVE ANTICOAGULATION: You are taking Eliquis, a blood thinner, and have upcoming surgery. Take your last dose of Eliquis tonight and hold it until after your surgery.  -MEDICATION RECONCILIATION: We reviewed your medications. Please discuss with your primary care provider about holding atorvastatin and torsemide before surgery. Hold Farxiga 72 hours before surgery.  -POSTOPERATIVE CARE: After surgery, stay active to prevent blood clots and respiratory issues. If you are given an incentive spirometer, make sure you understand how to use it.  INSTRUCTIONS:  Please confirm the type of anesthesia with Dr. Jenne Pane before your surgery. Discuss with your primary care provider about dosing of atorvastatin and torsemide. Hold Farxiga 72 hours before surgery. Take your last dose of Eliquis tonight and hold it until after surgery. Stay active postoperatively to prevent blood clots and respiratory complications. If you have any questions or concerns, please contact our office.  Follow-up 3 months with either Dr. Francine Graven or Dr. Judeth Horn ([facility]- former [facility])

## 2023-06-14 NOTE — Telephone Encounter (Signed)
Zachary Hawkins's ov note containing risk assessment from 06/13/23 was faxed to Darin Engels at [facility].

## 2023-06-14 NOTE — Anesthesia Preprocedure Evaluation (Addendum)
 Anesthesia Evaluation  Patient identified by MRN, date of birth, ID band Patient awake    Reviewed: Allergy & Precautions, H&P , NPO status , Patient's Chart, lab work & pertinent test results  Airway Mallampati: II  TM Distance: >3 FB Neck ROM: Full    Dental  (+) Missing, Poor Dentition   Pulmonary asthma , sleep apnea , COPD, former smoker   Pulmonary exam normal breath sounds clear to auscultation       Cardiovascular hypertension, +CHF  Normal cardiovascular exam+ dysrhythmias Atrial Fibrillation  Rhythm:Regular Rate:Normal  IMPRESSIONS     1. Left ventricular ejection fraction, by estimation, is 60 to 65%. The  left ventricle has normal function. Left ventricular endocardial border  not optimally defined to evaluate regional wall motion. There is severe  concentric left ventricular  hypertrophy. Left ventricular diastolic parameters are consistent with  Grade I diastolic dysfunction (impaired relaxation).   2. Right ventricular systolic function was not well visualized. The right  ventricular size is not well visualized.   3. The mitral valve is grossly normal. No evidence of mitral valve  regurgitation.   4. The aortic valve was not well visualized. Aortic valve regurgitation  is not visualized. No aortic stenosis is present.   5. Aortic dilatation noted. There is mild dilatation of the ascending  aorta, measuring 41 mm.   6. The inferior vena cava is normal in size with greater than 50%  respiratory variability, suggesting right atrial pressure of 3 mmHg.     Neuro/Psych negative neurological ROS  negative psych ROS   GI/Hepatic negative GI ROS, Neg liver ROS,,,  Endo/Other  diabetes    Renal/GU CRFRenal disease  negative genitourinary   Musculoskeletal negative musculoskeletal ROS (+)    Abdominal   Peds negative pediatric ROS (+)  Hematology negative hematology ROS (+)   Anesthesia Other  Findings   Reproductive/Obstetrics negative OB ROS                             Anesthesia Physical Anesthesia Plan  ASA: 3  Anesthesia Plan: General   Post-op Pain Management: Tylenol  PO (pre-op)*   Induction: Intravenous  PONV Risk Score and Plan: 2 and Ondansetron , Dexamethasone  and Treatment may vary due to age or medical condition  Airway Management Planned: Oral ETT  Additional Equipment:   Intra-op Plan:   Post-operative Plan: Extubation in OR  Informed Consent: I have reviewed the patients History and Physical, chart, labs and discussed the procedure including the risks, benefits and alternatives for the proposed anesthesia with the patient or authorized representative who has indicated his/her understanding and acceptance.     Dental advisory given  Plan Discussed with: CRNA  Anesthesia Plan Comments: (PAT note written 06/14/2023 by Allison Zelenak, PA-C.  History includes former smoker (quit 01/08/21), HTN, HLD, dilated cardiomyopathy (EF 40-45% 06/06/17, likely related to sever HTN, EF 60-65% 01/08/21), chronic diastolic CHF, PAF, DM2, CKD, COPD, home O2 (3L continuous), asthma, OSA (not using CPAP), BPH, angioedema (ACEi related 10/2017). Anaphylaxis with black cherry fruit extract or grenadine flavoring. )       Anesthesia Quick Evaluation

## 2023-06-16 ENCOUNTER — Other Ambulatory Visit: Payer: Self-pay

## 2023-06-16 ENCOUNTER — Encounter (HOSPITAL_COMMUNITY)
Admission: RE | Disposition: A | Discharge status: Home / Self Care | Payer: Self-pay | Source: Home / Self Care | Attending: Otolaryngology

## 2023-06-16 ENCOUNTER — Ambulatory Visit (HOSPITAL_COMMUNITY)
Admission: RE | Admit: 2023-06-16 | Discharge: 2023-06-16 | Disposition: A | Discharge status: Home / Self Care | Payer: 59 | Attending: Otolaryngology | Admitting: Otolaryngology

## 2023-06-16 ENCOUNTER — Ambulatory Visit (HOSPITAL_COMMUNITY): Payer: 59 | Admitting: Physician Assistant

## 2023-06-16 ENCOUNTER — Ambulatory Visit (HOSPITAL_BASED_OUTPATIENT_CLINIC_OR_DEPARTMENT_OTHER): Payer: 59

## 2023-06-16 ENCOUNTER — Encounter (HOSPITAL_COMMUNITY): Payer: Self-pay | Admitting: Otolaryngology

## 2023-06-16 DIAGNOSIS — J324 Chronic pansinusitis: Secondary | ICD-10-CM | POA: Diagnosis not present

## 2023-06-16 DIAGNOSIS — Z7985 Long-term (current) use of injectable non-insulin antidiabetic drugs: Secondary | ICD-10-CM | POA: Insufficient documentation

## 2023-06-16 DIAGNOSIS — J449 Chronic obstructive pulmonary disease, unspecified: Secondary | ICD-10-CM

## 2023-06-16 DIAGNOSIS — E1122 Type 2 diabetes mellitus with diabetic chronic kidney disease: Secondary | ICD-10-CM | POA: Diagnosis not present

## 2023-06-16 DIAGNOSIS — I429 Cardiomyopathy, unspecified: Secondary | ICD-10-CM | POA: Diagnosis not present

## 2023-06-16 DIAGNOSIS — Z7984 Long term (current) use of oral hypoglycemic drugs: Secondary | ICD-10-CM | POA: Diagnosis not present

## 2023-06-16 DIAGNOSIS — I509 Heart failure, unspecified: Secondary | ICD-10-CM | POA: Insufficient documentation

## 2023-06-16 DIAGNOSIS — I130 Hypertensive heart and chronic kidney disease with heart failure and stage 1 through stage 4 chronic kidney disease, or unspecified chronic kidney disease: Secondary | ICD-10-CM | POA: Diagnosis not present

## 2023-06-16 DIAGNOSIS — N183 Chronic kidney disease, stage 3 unspecified: Secondary | ICD-10-CM | POA: Diagnosis not present

## 2023-06-16 DIAGNOSIS — Z87891 Personal history of nicotine dependence: Secondary | ICD-10-CM | POA: Diagnosis not present

## 2023-06-16 DIAGNOSIS — J339 Nasal polyp, unspecified: Secondary | ICD-10-CM | POA: Diagnosis not present

## 2023-06-16 DIAGNOSIS — I480 Paroxysmal atrial fibrillation: Secondary | ICD-10-CM | POA: Insufficient documentation

## 2023-06-16 DIAGNOSIS — Z6841 Body Mass Index (BMI) 40.0 and over, adult: Secondary | ICD-10-CM | POA: Insufficient documentation

## 2023-06-16 DIAGNOSIS — Z7901 Long term (current) use of anticoagulants: Secondary | ICD-10-CM | POA: Diagnosis not present

## 2023-06-16 DIAGNOSIS — G4733 Obstructive sleep apnea (adult) (pediatric): Secondary | ICD-10-CM | POA: Insufficient documentation

## 2023-06-16 DIAGNOSIS — E669 Obesity, unspecified: Secondary | ICD-10-CM | POA: Insufficient documentation

## 2023-06-16 DIAGNOSIS — Z9981 Dependence on supplemental oxygen: Secondary | ICD-10-CM | POA: Diagnosis not present

## 2023-06-16 DIAGNOSIS — Z01818 Encounter for other preprocedural examination: Secondary | ICD-10-CM

## 2023-06-16 HISTORY — PX: FRONTAL SINUS EXPLORATION: SHX6591

## 2023-06-16 HISTORY — PX: SINUS ENDO W/FUSION: SHX777

## 2023-06-16 HISTORY — PX: MAXILLARY ANTROSTOMY: SHX2003

## 2023-06-16 HISTORY — PX: ETHMOIDECTOMY: SHX5197

## 2023-06-16 HISTORY — PX: SPHENOIDECTOMY: SHX2421

## 2023-06-16 LAB — GLUCOSE, CAPILLARY
Glucose-Capillary: 102 mg/dL — ABNORMAL HIGH (ref 70–99)
Glucose-Capillary: 99 mg/dL (ref 70–99)
Glucose-Capillary: 129 mg/dL — ABNORMAL HIGH (ref 70–99)

## 2023-06-16 SURGERY — SINUS SURGERY, ENDOSCOPIC, USING COMPUTER-ASSISTED NAVIGATION
Anesthesia: General | Site: Nose | Laterality: Bilateral

## 2023-06-16 MED ORDER — OXYCODONE HCL 5 MG PO TABS: 5.0000 mg | ORAL_TABLET | Freq: Once | ORAL | Status: DC | PRN

## 2023-06-16 MED ORDER — PROPOFOL 10 MG/ML IV BOLUS
INTRAVENOUS | Status: DC | PRN
  Administered 2023-06-16: 150 mg via INTRAVENOUS

## 2023-06-16 MED ORDER — SODIUM CHLORIDE 0.9 % IV SOLN
3.0000 g | Freq: Once | INTRAVENOUS | Status: AC
  Administered 2023-06-16: 3 g via INTRAVENOUS
  Filled 2023-06-16: qty 8

## 2023-06-16 MED ORDER — FENTANYL CITRATE (PF) 250 MCG/5ML IJ SOLN
INTRAMUSCULAR | Status: DC | PRN
  Administered 2023-06-16: 100 ug via INTRAVENOUS

## 2023-06-16 MED ORDER — ONDANSETRON HCL 4 MG/2ML IJ SOLN
INTRAMUSCULAR | Status: DC | PRN
  Administered 2023-06-16: 4 mg via INTRAVENOUS

## 2023-06-16 MED ORDER — ROCURONIUM BROMIDE 10 MG/ML (PF) SYRINGE
PREFILLED_SYRINGE | INTRAVENOUS | Status: AC
  Filled 2023-06-16: qty 10

## 2023-06-16 MED ORDER — LIDOCAINE-EPINEPHRINE 1 %-1:100000 IJ SOLN
INTRAMUSCULAR | Status: AC
  Filled 2023-06-16: qty 1

## 2023-06-16 MED ORDER — LIDOCAINE 2% (20 MG/ML) 5 ML SYRINGE
INTRAMUSCULAR | Status: AC
  Filled 2023-06-16: qty 5

## 2023-06-16 MED ORDER — LIDOCAINE-EPINEPHRINE 1 %-1:100000 IJ SOLN
INTRAMUSCULAR | Status: DC | PRN
  Administered 2023-06-16: 9 mL

## 2023-06-16 MED ORDER — OXYMETAZOLINE HCL 0.05 % NA SOLN
NASAL | Status: DC | PRN
  Administered 2023-06-16: 1 via TOPICAL

## 2023-06-16 MED ORDER — FENTANYL CITRATE (PF) 250 MCG/5ML IJ SOLN
INTRAMUSCULAR | Status: AC
  Filled 2023-06-16: qty 5

## 2023-06-16 MED ORDER — ROCURONIUM BROMIDE 10 MG/ML (PF) SYRINGE
PREFILLED_SYRINGE | INTRAVENOUS | Status: DC | PRN
  Administered 2023-06-16: 60 mg via INTRAVENOUS

## 2023-06-16 MED ORDER — LIDOCAINE 2% (20 MG/ML) 5 ML SYRINGE
INTRAMUSCULAR | Status: DC | PRN
  Administered 2023-06-16: 100 mg via INTRAVENOUS

## 2023-06-16 MED ORDER — SODIUM CHLORIDE 0.9 % IV SOLN: INTRAVENOUS | Status: DC | PRN

## 2023-06-16 MED ORDER — MIDAZOLAM HCL 2 MG/2ML IJ SOLN
INTRAMUSCULAR | Status: DC | PRN
  Administered 2023-06-16: 2 mg via INTRAVENOUS

## 2023-06-16 MED ORDER — DEXAMETHASONE SODIUM PHOSPHATE 10 MG/ML IJ SOLN
INTRAMUSCULAR | Status: AC
  Filled 2023-06-16: qty 1

## 2023-06-16 MED ORDER — OXYCODONE HCL 5 MG/5ML PO SOLN: 5.0000 mg | Freq: Once | ORAL | Status: DC | PRN

## 2023-06-16 MED ORDER — PHENYLEPHRINE 80 MCG/ML (10ML) SYRINGE FOR IV PUSH (FOR BLOOD PRESSURE SUPPORT)
PREFILLED_SYRINGE | INTRAVENOUS | Status: DC | PRN
  Administered 2023-06-16: 240 ug via INTRAVENOUS
  Administered 2023-06-16: 160 ug via INTRAVENOUS
  Administered 2023-06-16: 120 ug via INTRAVENOUS
  Administered 2023-06-16 (×3): 80 ug via INTRAVENOUS
  Administered 2023-06-16 (×3): 160 ug via INTRAVENOUS
  Administered 2023-06-16: 80 ug via INTRAVENOUS

## 2023-06-16 MED ORDER — SUGAMMADEX SODIUM 200 MG/2ML IV SOLN
INTRAVENOUS | Status: DC | PRN
  Administered 2023-06-16 (×2): 200 mg via INTRAVENOUS

## 2023-06-16 MED ORDER — ONDANSETRON HCL 4 MG/2ML IJ SOLN
INTRAMUSCULAR | Status: AC
  Filled 2023-06-16: qty 2

## 2023-06-16 MED ORDER — CHLORHEXIDINE GLUCONATE 0.12 % MT SOLN
15.0000 mL | Freq: Once | OROMUCOSAL | Status: AC
  Administered 2023-06-16: 15 mL via OROMUCOSAL
  Filled 2023-06-16: qty 15

## 2023-06-16 MED ORDER — MIDAZOLAM HCL 2 MG/2ML IJ SOLN
INTRAMUSCULAR | Status: AC
  Filled 2023-06-16: qty 2

## 2023-06-16 MED ORDER — ACETAMINOPHEN 10 MG/ML IV SOLN: 1000.0000 mg | Freq: Once | INTRAVENOUS | Status: DC | PRN

## 2023-06-16 MED ORDER — DEXAMETHASONE SODIUM PHOSPHATE 10 MG/ML IJ SOLN
INTRAMUSCULAR | Status: DC | PRN
  Administered 2023-06-16: 10 mg via INTRAVENOUS

## 2023-06-16 MED ORDER — ORAL CARE MOUTH RINSE: 15.0000 mL | Freq: Once | OROMUCOSAL | Status: AC

## 2023-06-16 MED ORDER — DROPERIDOL 2.5 MG/ML IJ SOLN: 0.6250 mg | Freq: Once | INTRAMUSCULAR | Status: DC | PRN

## 2023-06-16 MED ORDER — PHENYLEPHRINE HCL-NACL 20-0.9 MG/250ML-% IV SOLN
INTRAVENOUS | Status: DC | PRN
  Administered 2023-06-16: 40 ug/min via INTRAVENOUS

## 2023-06-16 MED ORDER — INSULIN ASPART 100 UNIT/ML IJ SOLN: 0.0000 [IU] | INTRAMUSCULAR | Status: DC | PRN

## 2023-06-16 MED ORDER — MUPIROCIN 2 % EX OINT
TOPICAL_OINTMENT | CUTANEOUS | Status: AC
  Filled 2023-06-16: qty 22

## 2023-06-16 MED ORDER — ALBUMIN HUMAN 5 % IV SOLN: INTRAVENOUS | Status: DC | PRN

## 2023-06-16 MED ORDER — FENTANYL CITRATE (PF) 100 MCG/2ML IJ SOLN: 25.0000 ug | INTRAMUSCULAR | Status: DC | PRN

## 2023-06-16 MED ORDER — LACTATED RINGERS IV SOLN: INTRAVENOUS | Status: DC

## 2023-06-16 MED ORDER — SODIUM CHLORIDE 0.9 % IR SOLN
Status: DC | PRN
  Administered 2023-06-16: 250 mL

## 2023-06-16 SURGICAL SUPPLY — 34 items
BLADE RAD60 ROTATE M4 4 5PK (BLADE) IMPLANT
BLADE ROTATE TRICUT 4X13 M4 (BLADE) ×1 IMPLANT
BLADE SURG 15 STRL LF DISP TIS (BLADE) IMPLANT
CANISTER SUCT 3000ML PPV (MISCELLANEOUS) ×1 IMPLANT
CLSR STERI-STRIP ANTIMIC 1/2X4 (GAUZE/BANDAGES/DRESSINGS) ×1 IMPLANT
COAGULATOR SUCT SWTCH 10FR 6 (ELECTROSURGICAL) IMPLANT
GLOVE BIO SURGEON STRL SZ7.5 (GLOVE) ×1 IMPLANT
GLOVE ECLIPSE 7.5 STRL STRAW (GLOVE) ×1 IMPLANT
GOWN STRL REUS W/ TWL LRG LVL3 (GOWN DISPOSABLE) ×2 IMPLANT
IMPL PROPEL MINI SINUS SDS (Prosthesis and Implant ENT) IMPLANT
IMPLANT PROPEL MINI SINUS SDS (Prosthesis and Implant ENT) ×2 IMPLANT
KIT BASIN OR (CUSTOM PROCEDURE TRAY) ×1 IMPLANT
KIT TURNOVER KIT B (KITS) ×1 IMPLANT
NDL PRECISIONGLIDE 27X1.5 (NEEDLE) ×1 IMPLANT
NDL SPNL 25GX3.5 QUINCKE BL (NEEDLE) ×1 IMPLANT
NEEDLE PRECISIONGLIDE 27X1.5 (NEEDLE) ×1
NEEDLE SPNL 25GX3.5 QUINCKE BL (NEEDLE) ×1
NS IRRIG 1000ML POUR BTL (IV SOLUTION) ×1 IMPLANT
PAD ARMBOARD 7.5X6 YLW CONV (MISCELLANEOUS) ×2 IMPLANT
PAD ENT ADHESIVE 25PK (MISCELLANEOUS) ×1 IMPLANT
PATTIES SURGICAL .5 X3 (DISPOSABLE) ×1 IMPLANT
POSITIONER HEAD DONUT 9IN (MISCELLANEOUS) IMPLANT
SHEATH ENDOSCRUB 0 DEG (SHEATH) ×1 IMPLANT
SHEATH ENDOSCRUB 30 DEG (SHEATH) ×1 IMPLANT
SOL ANTI FOG 6CC (MISCELLANEOUS) ×1 IMPLANT
SPECIMEN JAR SMALL (MISCELLANEOUS) ×2 IMPLANT
SPLINT NASAL POSISEP X .6X2 (GAUZE/BANDAGES/DRESSINGS) IMPLANT
TOWEL GREEN STERILE FF (TOWEL DISPOSABLE) ×1 IMPLANT
TRACKER ENT INSTRUMENT (MISCELLANEOUS) ×1 IMPLANT
TRACKER ENT PATIENT (MISCELLANEOUS) ×1 IMPLANT
TRAY ENT MC OR (CUSTOM PROCEDURE TRAY) ×1 IMPLANT
TUBE CONNECTING 12X1/4 (SUCTIONS) ×1 IMPLANT
TUBING STRAIGHTSHOT EPS 5PK (TUBING) IMPLANT
WATER STERILE IRR 1000ML POUR (IV SOLUTION) ×1 IMPLANT

## 2023-06-16 NOTE — Op Note (Signed)
 PREOPERATIVE DIAGNOSIS:  Chronic polypoid pansinusitis   POSTOPERATIVE DIAGNOSIS:  Chronic polypoid pansinusitis   PROCEDURE:  Bilateral total ethmoidectomy, bilateral frontal sinus exploration, bilateral maxillary antrostomy, bilateral sphenoidotomy, Fusion image guidance   SURGEON:  Vaughan Ricker, MD   ANESTHESIA:  General endotracheal anesthesia   COMPLICATIONS:  None   INDICATIONS:  The patient is a 68 year old male with a history of symptomatic chronic polypoid sinusitis with limited response to medical therapy.  He presents to the operating room for surgical management.   FINDINGS:  Extensive polyp disease through the ethmoids and middle and superior meatus regions.   DESCRIPTION OF PROCEDURE:  The patient was identified in the holding room, informed consent having been obtained including discussion of risks, benefits and alternatives, the patient was brought to the operative suite and put the operative table in the supine position.  Anesthesia was induced and the patient was intubated by the anesthesia team without difficulty.  The eyes were lubricated and the Fusion antenna was placed.  The patient was given intravenous antibiotics during the case.  The face was prepped and draped in sterile fashion.  The patient was registered to the Fusion system in the standard fashion.  The eyes were taped closed and Afrin-soaked pledgets were placed in both sides of the nose.  After registering instruments, the right-sided pledgets were removed and the nasal passage was inspected with a straight telescope.  The lateral nasal wall was injected with local anesthetic.  Polyp was debrided from the middle meatus using the microdebrider.  The uncinate process was inverted already and was removed.  The bulla ethmoidalis was removed along with anterior ethmoid air cells.  An angled telescope was used to evaluate the maxillary opening that was widened posteriorly and inferiorly.  The straight telescope was again  used.  Posterior ethmoid cells were then removed using the microdebrider after penetrating the basal lamella.  Image guidance was used to assist.  The middle turbinate was then lateralized and the posterior septum was injected with local anesthetic.  The lower portion of the superior turbinate was removed with the microdebrider.  The natural opening of the sphenoid sinus was identified and widened superiorly and laterally.  The middle turbinate was re-medialized and was then partially removed with the microdebrider due to floppiness.  The anterior skull base was then inspected with an angled telescope.  Septations were removed using the microdebrider in a retrograde fashion to the frontal recess.  Image guidance was used.  The frontal recess was then dissected of septations using the microdebrider.  The natural frontal recess was identified and surround septations removed leaving a widely patent pathway.  Image guidance was used.  At this point, Afrin-soaked pledgets were placed in the sinuses.  Attention was then directed to the left side where the same procedures were performed including injection of local anesthetic, removal of polyp from the middle meatus, removal of the uncinate process, widening of the maxillary opening, removal of anterior and posterior ethmoid air cells, widening of the sphenoid opening, dissection of the anterior skull base, clearing of the frontal recess, and placement of Afrin-soaked pledgets.  The middle turbinate was kept intact on the left side.  Pledgets were then removed and the sinuses inspected.  Some additional minor work was done to clear small fragments and to smooth surfaces.  An ethmoid Propel implant was then deployed on each side.  A PosiSep pack was then placed in each ethmoid and saturated with saline.  The stump of the middle turbinate  on the right side was then cauterized with suction cautery. Nasal passages and the throat were suctioned.   Drapes were removed and  the patient was cleaned off.  The patient was returned to anesthesia for wakeup, extubated, and taken to the recovery room in stable condition.

## 2023-06-16 NOTE — Anesthesia Procedure Notes (Signed)
 Procedure Name: Intubation Date/Time: 06/16/2023 3:29 PM  Performed by: Roslynn Waddell LABOR, CRNAPre-anesthesia Checklist: Patient identified, Emergency Drugs available, Suction available and Patient being monitored Patient Re-evaluated:Patient Re-evaluated prior to induction Oxygen Delivery Method: Circle System Utilized Preoxygenation: Pre-oxygenation with 100% oxygen Induction Type: IV induction Ventilation: Oral airway inserted - appropriate to patient size and Two handed mask ventilation required Laryngoscope Size: Glidescope and 4 Grade View: Grade I Tube type: Oral Number of attempts: 1 Airway Equipment and Method: Stylet and Oral airway Placement Confirmation: ETT inserted through vocal cords under direct vision, positive ETCO2 and breath sounds checked- equal and bilateral Secured at: 23 cm Tube secured with: Tape Dental Injury: Teeth and Oropharynx as per pre-operative assessment  Comments: Atraumatic induction/intubation. Patient missing teeth (upper front) at baseline. Dentition and oral mucosa as per preop.

## 2023-06-16 NOTE — H&P (Signed)
 Zachary Hawkins is an 68 y.o. male.   Chief Complaint: Chronic polypoid sinusitis HPI: 68 year old male with a long history of chronic sinus symptoms including nasal obstruction and anosmia.  CT imaging demonstrated diffuse polypoid sinusitis and he presents for surgical management.  Past Medical History:  Diagnosis Date   Asthma    BPH (benign prostatic hyperplasia)    Cardiomyopathy (HCC) 06/2017   EF 40-45%   CHF (congestive heart failure) (HCC)    Chronic renal insufficiency, stage 3 (moderate) (HCC)    COPD (chronic obstructive pulmonary disease) (HCC)    Diabetes mellitus without complication (HCC)    Diastolic dysfunction 06/2017   grade 2    Hyperlipidemia    Hypertension    Hypertensive urgency    Obesity    Obesity    OSA (obstructive sleep apnea)    Oxygen dependent    Paroxysmal atrial fibrillation (HCC) 2022   on Eliquis     History reviewed. No pertinent surgical history.  Family History  Problem Relation Age of Onset   Diabetes Mother    Diabetes Father    Social History:  reports that he quit smoking about 2 years ago. His smoking use included cigarettes. He started smoking about 38 years ago. He has a 36 pack-year smoking history. He has never used smokeless tobacco. He reports current alcohol use of about 1.0 standard drink of alcohol per week. He reports that he does not currently use drugs after having used the following drugs: Marijuana.  Allergies:  Allergies  Allergen Reactions   Ace Inhibitors Anaphylaxis   Black Cherry Fruit Extract Carolee Extract] Anaphylaxis    Tongue   Grenadine Flavor [Flavoring Agent (Non-Screening)] Anaphylaxis    Medications Prior to Admission  Medication Sig Dispense Refill   albuterol  (PROVENTIL ) (2.5 MG/3ML) 0.083% nebulizer solution Take 3 mLs (2.5 mg total) by nebulization every 6 (six) hours as needed for wheezing or shortness of breath. 75 mL 5   albuterol  (VENTOLIN  HFA) 108 (90 Base) MCG/ACT inhaler Inhale 2 puffs  into the lungs every 4 (four) hours as needed for wheezing or shortness of breath. 18 g 6   amLODipine  (NORVASC ) 2.5 MG tablet Take 1 tablet (2.5 mg total) by mouth daily. 90 tablet 3   atorvastatin  (LIPITOR) 10 MG tablet TAKE 1 TABLET (10 MG TOTAL) BY MOUTH DAILY(AM) 90 tablet 3   atorvastatin  (LIPITOR) 20 MG tablet Take 20 mg by mouth daily.     bisoprolol  (ZEBETA ) 5 MG tablet Take 1 tablet (5 mg total) by mouth daily. 90 tablet 3   famotidine  (PEPCID ) 20 MG tablet Take 1 tablet (20 mg total) by mouth 2 (two) times daily. 28 tablet 0   FARXIGA  10 MG TABS tablet TAKE 1 TABLET (10 MG TOTAL) BY MOUTH DAILY (AM) 30 tablet 11   fluticasone  (FLONASE ) 50 MCG/ACT nasal spray PLACE 1 SPRAY INTO BOTH NOSTRILS DAILY. (Patient taking differently: Place 2 sprays into both nostrils at bedtime.) 16 g 11   OZEMPIC, 0.25 OR 0.5 MG/DOSE, 2 MG/3ML SOPN Inject 0.5 mg into the skin once a week. Wednesday     potassium chloride  SA (KLOR-CON  M) 20 MEQ tablet TAKE 1 TABLET (20 MEQ TOTAL) BY MOUTH DAILY (AM) 30 tablet 3   spironolactone  (ALDACTONE ) 25 MG tablet TAKE 2 TABLETS (50 MG TOTAL) BY MOUTH DAILY (2AM) 90 tablet 3   Tiotropium Bromide Monohydrate  (SPIRIVA  RESPIMAT) 2.5 MCG/ACT AERS INHALE 2 PUFFS INTO THE LUNGS DAILY. 4 g 3   torsemide  (DEMADEX )  20 MG tablet TAKE 3 TABLETS (60 MG TOTAL) BY MOUTH DAILY. (Patient taking differently: Take 60 mg by mouth daily.) 180 tablet 3   amoxicillin  (AMOXIL ) 875 MG tablet Take 875 mg by mouth 2 (two) times daily.     ELIQUIS  5 MG TABS tablet TAKE 1 TABLET (5 MG TOTAL) BY MOUTH 2 (TWO) TIMES DAILY (AM+PM) 60 tablet 11   EPINEPHrine  0.3 mg/0.3 mL IJ SOAJ injection Inject 0.3 mg into the muscle as needed for anaphylaxis. 1 each 1    Results for orders placed or performed during the hospital encounter of 06/16/23 (from the past 48 hours)  Glucose, capillary     Status: Abnormal   Collection Time: 06/16/23 11:55 AM  Result Value Ref Range   Glucose-Capillary 102 (H) 70 - 99  mg/dL    Comment: Glucose reference range applies only to samples taken after fasting for at least 8 hours.  Glucose, capillary     Status: None   Collection Time: 06/16/23  1:42 PM  Result Value Ref Range   Glucose-Capillary 99 70 - 99 mg/dL    Comment: Glucose reference range applies only to samples taken after fasting for at least 8 hours.   No results found.  Review of Systems  All other systems reviewed and are negative.   Blood pressure (!) 118/106, pulse 86, temperature 98.2 F (36.8 C), resp. rate 18, height 6' 1 (1.854 m), weight (!) 158.8 kg, SpO2 96%. Physical Exam Constitutional:      Appearance: Normal appearance.  HENT:     Head: Normocephalic and atraumatic.     Right Ear: External ear normal.     Left Ear: External ear normal.     Nose: Nose normal.     Mouth/Throat:     Mouth: Mucous membranes are moist.     Pharynx: Oropharynx is clear.  Eyes:     Extraocular Movements: Extraocular movements intact.     Pupils: Pupils are equal, round, and reactive to light.  Cardiovascular:     Rate and Rhythm: Normal rate.  Pulmonary:     Effort: Pulmonary effort is normal.  Musculoskeletal:     Cervical back: Normal range of motion.  Skin:    General: Skin is warm and dry.  Neurological:     General: No focal deficit present.     Mental Status: He is alert and oriented to person, place, and time.  Psychiatric:        Mood and Affect: Mood normal.        Behavior: Behavior normal.        Thought Content: Thought content normal.        Judgment: Judgment normal.      Assessment/Plan Chronic polypoid pansinusitis  To OR for bilateral endoscopic sinus surgery.  Vaughan Ricker, MD 06/16/2023, 2:04 PM

## 2023-06-16 NOTE — Transfer of Care (Signed)
 Immediate Anesthesia Transfer of Care Note  Patient: Zachary Hawkins  Procedure(s) Performed: ENDOSCOPIC SINUS SURGERY WITH NAVIGATION (Bilateral) TOTAL ETHMOIDECTOMY (Bilateral) MAXILLARY ANTROSTOMY (Bilateral) NASAL FRONTAL SINUS EXPLORATION (Bilateral) SPHENOIDOTOMY (Bilateral)  Patient Location: PACU  Anesthesia Type:General  Level of Consciousness: awake, alert , and patient cooperative  Airway & Oxygen Therapy: Patient Spontanous Breathing and Patient connected to face mask oxygen  Post-op Assessment: Report given to RN and Post -op Vital signs reviewed and stable  Post vital signs: Reviewed and stable  Last Vitals:  Vitals Value Taken Time  BP 124/73 06/16/23 1732  Temp    Pulse 88 06/16/23 1735  Resp 21 06/16/23 1735  SpO2 89 % 06/16/23 1735  Vitals shown include unfiled device data.  Last Pain:  Vitals:   06/16/23 1239  PainSc: 0-No pain         Complications: No notable events documented.

## 2023-06-17 NOTE — Anesthesia Postprocedure Evaluation (Signed)
 Anesthesia Post Note  Patient: Zachary Hawkins  Procedure(s) Performed: ENDOSCOPIC SINUS SURGERY WITH NAVIGATION (Bilateral: Nose) TOTAL ETHMOIDECTOMY (Bilateral: Nose) MAXILLARY ANTROSTOMY (Bilateral: Nose) NASAL FRONTAL SINUS EXPLORATION (Bilateral: Nose) SPHENOIDOTOMY (Bilateral: Nose)     Patient location during evaluation: PACU Anesthesia Type: General Level of consciousness: awake and alert Pain management: pain level controlled Vital Signs Assessment: post-procedure vital signs reviewed and stable Respiratory status: spontaneous breathing, nonlabored ventilation, respiratory function stable and patient connected to nasal cannula oxygen Cardiovascular status: blood pressure returned to baseline and stable Postop Assessment: no apparent nausea or vomiting Anesthetic complications: no   No notable events documented.  Last Vitals:  Vitals:   06/16/23 1800 06/16/23 1815  BP: 113/72 (!) 130/92  Pulse: 86 81  Resp: (!) 22   Temp:  36.4 C  SpO2: (!) 87% 91%    Last Pain:  Vitals:   06/16/23 1800  PainSc: 0-No pain                 Thom JONELLE Peoples

## 2023-06-20 LAB — SURGICAL PATHOLOGY

## 2023-06-22 ENCOUNTER — Encounter (HOSPITAL_COMMUNITY): Payer: Self-pay | Admitting: Otolaryngology

## 2023-06-23 ENCOUNTER — Ambulatory Visit (HOSPITAL_BASED_OUTPATIENT_CLINIC_OR_DEPARTMENT_OTHER): Payer: 59 | Admitting: Internal Medicine

## 2023-07-19 ENCOUNTER — Encounter (HOSPITAL_BASED_OUTPATIENT_CLINIC_OR_DEPARTMENT_OTHER): Payer: 59 | Admitting: Internal Medicine

## 2023-08-03 ENCOUNTER — Encounter: Payer: 59 | Admitting: Emergency Medicine

## 2023-08-04 ENCOUNTER — Other Ambulatory Visit (HOSPITAL_COMMUNITY): Payer: Self-pay | Admitting: Cardiology

## 2023-08-04 NOTE — Telephone Encounter (Signed)
 This is a CHF pt

## 2023-09-06 ENCOUNTER — Other Ambulatory Visit (HOSPITAL_COMMUNITY): Payer: Self-pay | Admitting: Cardiology

## 2023-09-20 ENCOUNTER — Encounter: Payer: Self-pay | Admitting: Pulmonary Disease

## 2023-09-20 ENCOUNTER — Ambulatory Visit: Admitting: Pulmonary Disease

## 2023-09-20 VITALS — BP 117/90 | HR 84 | Ht 73.0 in | Wt 356.0 lb

## 2023-09-20 DIAGNOSIS — J449 Chronic obstructive pulmonary disease, unspecified: Secondary | ICD-10-CM

## 2023-09-20 DIAGNOSIS — G4730 Sleep apnea, unspecified: Secondary | ICD-10-CM

## 2023-09-20 DIAGNOSIS — J9611 Chronic respiratory failure with hypoxia: Secondary | ICD-10-CM

## 2023-09-20 DIAGNOSIS — Z87891 Personal history of nicotine dependence: Secondary | ICD-10-CM | POA: Diagnosis not present

## 2023-09-20 DIAGNOSIS — G4733 Obstructive sleep apnea (adult) (pediatric): Secondary | ICD-10-CM

## 2023-09-20 MED ORDER — FLUTICASONE-SALMETEROL 250-50 MCG/ACT IN AEPB: 1.0000 | INHALATION_SPRAY | Freq: Two times a day (BID) | RESPIRATORY_TRACT | 11 refills | Status: AC

## 2023-09-20 MED ORDER — SPIRIVA RESPIMAT 2.5 MCG/ACT IN AERS: 2.0000 | INHALATION_SPRAY | Freq: Every day | RESPIRATORY_TRACT | 11 refills | Status: AC

## 2023-09-20 NOTE — Patient Instructions (Addendum)
 Start advair  diskus 250-50mcg 1 puff twice daily - rinse mouth out after each use  Continue Spiriva  2 puffs daily  Use your inhalers every day  Use albuterol  inhaler 1-2 puffs every 4-6 hours as needed  Will need to get you scheduled for the split night sleep study  Follow up in 6 months, call sooner if needed

## 2023-09-20 NOTE — Progress Notes (Signed)
 Synopsis: Hx of COPD  Subjective:   PATIENT ID: Zachary Hawkins GENDER: male DOB: 07-23-1955, MRN: [id number]   HPI  Chief Complaint  Patient presents with   Follow-up   Zachary Hawkins is a 68 year old male, former smoker with history of HFrEF, OSA, atrial fibrillation, obesity and DMII who returns to pulmonary clinic for follow up of COPD.  He uses Spiriva  inhaler as needed for dyspnea but not regularly. He is uncertain about the Breztri  inhaler due to potential insurance issues. He uses a rescue inhaler, described as a 'little red one', as needed. He occasionally smokes marijuana using paper joints without tobacco and does not notice any significant impact on his breathing. He denies cigarette smoking and any other drug use.  He has sleep apnea but is not using a CPAP machine. A scheduled sleep study was postponed due to medication issues.   Past Medical History:  Diagnosis Date   Asthma    BPH (benign prostatic hyperplasia)    Cardiomyopathy (HCC) 06/2017   EF 40-45%   CHF (congestive heart failure) (HCC)    Chronic renal insufficiency, stage 3 (moderate) (HCC)    COPD (chronic obstructive pulmonary disease) (HCC)    Diabetes mellitus without complication (HCC)    Diastolic dysfunction 06/2017   grade 2    Hyperlipidemia    Hypertension    Hypertensive urgency    Obesity    Obesity    OSA (obstructive sleep apnea)    Oxygen dependent    Paroxysmal atrial fibrillation (HCC) 2022   on Eliquis      Family History  Problem Relation Age of Onset   Diabetes Mother    Diabetes Father      Social History   Socioeconomic History   Marital status: Single    Spouse name: Not on file   Number of children: Not on file   Years of education: Not on file   Highest education level: Not on file  Occupational History   Not on file  Tobacco Use   Smoking status: Former    Current packs/day: 0.00    Average packs/day: 1 pack/day for 36.0 years (36.0 ttl pk-yrs)    Types:  Cigarettes    Start date: 01/08/1985    Quit date: 01/08/2021    Years since quitting: 2.7   Smokeless tobacco: Never  Vaping Use   Vaping status: Never Used  Substance and Sexual Activity   Alcohol use: Yes    Alcohol/week: 1.0 standard drink of alcohol    Types: 1 Shots of liquor per week    Comment: 1-2 glasses a week   Drug use: Not Currently    Types: Marijuana   Sexual activity: Not on file  Other Topics Concern   Not on file  Social History Narrative   Not on file   Social Drivers of Health   Financial Resource Strain: High Risk (07/15/2021)   Overall Financial Resource Strain (CARDIA)    Difficulty of Paying Living Expenses: Very hard  Food Insecurity: No Food Insecurity (07/15/2021)   Hunger Vital Sign    Worried About Running Out of Food in the Last Year: Never true    Ran Out of Food in the Last Year: Never true  Transportation Needs: No Transportation Needs (07/15/2021)   PRAPARE - Administrator, Civil Service (Medical): No    Lack of Transportation (Non-Medical): No  Physical Activity: Not on file  Stress: Not on file  Social Connections: Unknown (09/22/2021)  Received from [facility]   Social Network    Social Network: Not on file  Intimate Partner Violence: Unknown (08/14/2021)   Received from [facility], [facility]   HITS    Physically Hurt: Not on file    Insult or Talk Down To: Not on file    Threaten Physical Harm: Not on file    Scream or Curse: Not on file     Allergies  Allergen Reactions   Ace Inhibitors Anaphylaxis   Black Cherry Fruit Extract Denman Fischer Extract] Anaphylaxis    Tongue   Grenadine Flavor [Flavoring Agent (Non-Screening)] Anaphylaxis     Outpatient Medications Prior to Visit  Medication Sig Dispense Refill   albuterol  (VENTOLIN  HFA) 108 (90 Base) MCG/ACT inhaler Inhale 2 puffs into the lungs every 4 (four) hours as needed for wheezing or shortness of breath. 18 g 6   amoxicillin  (AMOXIL ) 875 MG  tablet Take 875 mg by mouth 2 (two) times daily.     atorvastatin  (LIPITOR) 10 MG tablet TAKE 1 TABLET (10 MG TOTAL) BY MOUTH DAILY(AM) 90 tablet 3   atorvastatin  (LIPITOR) 20 MG tablet Take 20 mg by mouth daily.     bisoprolol  (ZEBETA ) 5 MG tablet TAKE 1 TABLET (5 MG TOTAL) BY MOUTH DAILY(AM) 30 tablet 0   ELIQUIS  5 MG TABS tablet TAKE 1 TABLET (5 MG TOTAL) BY MOUTH 2 (TWO) TIMES DAILY (AM+PM) 60 tablet 11   EPINEPHrine  0.3 mg/0.3 mL IJ SOAJ injection Inject 0.3 mg into the muscle as needed for anaphylaxis. 1 each 1   famotidine  (PEPCID ) 20 MG tablet Take 1 tablet (20 mg total) by mouth 2 (two) times daily. 28 tablet 0   FARXIGA  10 MG TABS tablet TAKE 1 TABLET (10 MG TOTAL) BY MOUTH DAILY (AM) 30 tablet 11   fluticasone  (FLONASE ) 50 MCG/ACT nasal spray PLACE 1 SPRAY INTO BOTH NOSTRILS DAILY. (Patient taking differently: Place 2 sprays into both nostrils at bedtime.) 16 g 11   OZEMPIC, 0.25 OR 0.5 MG/DOSE, 2 MG/3ML SOPN Inject 0.5 mg into the skin once a week. Wednesday     potassium chloride  SA (KLOR-CON  M) 20 MEQ tablet TAKE 1 TABLET (20 MEQ TOTAL) BY MOUTH DAILY (AM) 30 tablet 3   spironolactone  (ALDACTONE ) 25 MG tablet TAKE 2 TABLETS (50 MG TOTAL) BY MOUTH DAILY (2AM) 90 tablet 3   torsemide  (DEMADEX ) 20 MG tablet TAKE 3 TABLETS (60 MG TOTAL) BY MOUTH DAILY. 180 tablet 3   Tiotropium Bromide Monohydrate  (SPIRIVA  RESPIMAT) 2.5 MCG/ACT AERS INHALE 2 PUFFS INTO THE LUNGS DAILY. 4 g 3   albuterol  (PROVENTIL ) (2.5 MG/3ML) 0.083% nebulizer solution Take 3 mLs (2.5 mg total) by nebulization every 6 (six) hours as needed for wheezing or shortness of breath. 75 mL 5   amLODipine  (NORVASC ) 2.5 MG tablet Take 1 tablet (2.5 mg total) by mouth daily. 90 tablet 3   No facility-administered medications prior to visit.    Review of Systems  Constitutional:  Negative for chills, fever, malaise/fatigue and weight loss.  HENT:  Negative for congestion, sinus pain and sore throat.   Eyes: Negative.    Respiratory:  Positive for shortness of breath. Negative for cough, hemoptysis, sputum production and wheezing.   Cardiovascular:  Negative for chest pain, palpitations, orthopnea, claudication and leg swelling.  Gastrointestinal:  Negative for abdominal pain, heartburn, nausea and vomiting.  Genitourinary: Negative.   Musculoskeletal:  Negative for joint pain and myalgias.  Skin:  Negative for rash.  Neurological:  Negative  for weakness.  Endo/Heme/Allergies: Negative.   Psychiatric/Behavioral: Negative.        Objective:   Vitals:   09/20/23 1419  BP: (!) 117/90  Pulse: 84  SpO2: 98%  Weight: (!) 356 lb (161.5 kg)  Height: 6\' 1"  (1.854 m)     Physical Exam Constitutional:      General: He is not in acute distress.    Appearance: Normal appearance. He is obese.  Eyes:     General: No scleral icterus.    Conjunctiva/sclera: Conjunctivae normal.  Cardiovascular:     Rate and Rhythm: Normal rate and regular rhythm.  Pulmonary:     Breath sounds: No wheezing, rhonchi or rales.  Musculoskeletal:     Right lower leg: No edema.     Left lower leg: No edema.  Skin:    General: Skin is warm and dry.  Neurological:     General: No focal deficit present.       CBC    Component Value Date/Time   WBC 8.0 06/10/2023 0930   RBC 5.00 06/10/2023 0930   HGB 14.7 06/10/2023 0930   HGB 16.7 07/11/2020 1103   HCT 48.9 06/10/2023 0930   HCT 50.3 07/11/2020 1103   PLT 218 06/10/2023 0930   PLT 232 07/11/2020 1103   MCV 97.8 06/10/2023 0930   MCV 88 07/11/2020 1103   MCH 29.4 06/10/2023 0930   MCHC 30.1 06/10/2023 0930   RDW 14.7 06/10/2023 0930   RDW 13.6 07/11/2020 1103   LYMPHSABS 2.6 01/10/2023 2319   MONOABS 0.6 01/10/2023 2319   EOSABS 0.5 01/10/2023 2319   BASOSABS 0.1 01/10/2023 2319     Chest imaging: CXR 01/10/23 Unchanged cardiomegaly. Unchanged mediastinal contours. Peribronchial thickening which is likely chronic. Streaky right lung base atelectasis  or scarring. No focal airspace disease, pleural effusion, or pneumothorax.  PFT:    Latest Ref Rng & Units 11/15/2022   10:20 AM 12/23/2021    1:24 PM  PFT Results  FVC-Pre L 1.45    FVC-Predicted Pre % 27    Pre FEV1/FVC % % 76    FEV1-Pre L 1.11    FEV1-Predicted Pre % 28    DLCO uncorrected ml/min/mmHg  17.79   DLCO UNC% %  60   DLCO corrected ml/min/mmHg  17.79   DLCO COR %Predicted %  60   DLVA Predicted %  129   TLC L  4.55   TLC % Predicted %  59   RV % Predicted %  66     Labs:  Path:  Echo:  Heart Catheterization:       Assessment & Plan:   Chronic hypoxemic respiratory failure (HCC)  Chronic obstructive pulmonary disease, unspecified COPD type (HCC) - Plan: Tiotropium Bromide Monohydrate  (SPIRIVA  RESPIMAT) 2.5 MCG/ACT AERS, fluticasone -salmeterol (ADVAIR  DISKUS) 250-50 MCG/ACT AEPB  OSA (obstructive sleep apnea) - Plan: Split night study  Discussion: Zachary Hawkins is a 68 year old male, former smoker with history of HFrEF, OSA, atrial fibrillation, obesity and DMII who returns to pulmonary clinic for follow up of COPD.  Chronic Obstructive Pulmonary Disease (COPD) COPD with intermittent dyspnea. Inconsistent Spiriva  use. Not on Breztri  due to insurance issues. Emphasized daily maintenance inhalers for optimal function and inflammation reduction. Explained consistent inhaler use benefits. - Update Spiriva  prescription for daily use, two puffs in the morning. - Prescribe Advair  Diskus, one puff in the morning and one puff in the evening. - Provided written instructions for inhaler use. - Sent prescriptions to [facility].  Sleep Apnea Sleep apnea with incomplete previous sleep study due to medication interference. Symptoms include nocturia, possibly related to untreated sleep apnea. Discussed potential symptom alleviation with treatment. - Reorder sleep study and ensure scheduling with him. - Plan for CPAP or BiPAP machine setup post-sleep  study.  Follow up in 6 months.  Zachary German, MD [facility] Office: [phone]   Current Outpatient Medications:    albuterol  (VENTOLIN  HFA) 108 (90 Base) MCG/ACT inhaler, Inhale 2 puffs into the lungs every 4 (four) hours as needed for wheezing or shortness of breath., Disp: 18 g, Rfl: 6   amoxicillin  (AMOXIL ) 875 MG tablet, Take 875 mg by mouth 2 (two) times daily., Disp: , Rfl:    atorvastatin  (LIPITOR) 10 MG tablet, TAKE 1 TABLET (10 MG TOTAL) BY MOUTH DAILY(AM), Disp: 90 tablet, Rfl: 3   atorvastatin  (LIPITOR) 20 MG tablet, Take 20 mg by mouth daily., Disp: , Rfl:    bisoprolol  (ZEBETA ) 5 MG tablet, TAKE 1 TABLET (5 MG TOTAL) BY MOUTH DAILY(AM), Disp: 30 tablet, Rfl: 0   ELIQUIS  5 MG TABS tablet, TAKE 1 TABLET (5 MG TOTAL) BY MOUTH 2 (TWO) TIMES DAILY (AM+PM), Disp: 60 tablet, Rfl: 11   EPINEPHrine  0.3 mg/0.3 mL IJ SOAJ injection, Inject 0.3 mg into the muscle as needed for anaphylaxis., Disp: 1 each, Rfl: 1   famotidine  (PEPCID ) 20 MG tablet, Take 1 tablet (20 mg total) by mouth 2 (two) times daily., Disp: 28 tablet, Rfl: 0   FARXIGA  10 MG TABS tablet, TAKE 1 TABLET (10 MG TOTAL) BY MOUTH DAILY (AM), Disp: 30 tablet, Rfl: 11   fluticasone  (FLONASE ) 50 MCG/ACT nasal spray, PLACE 1 SPRAY INTO BOTH NOSTRILS DAILY. (Patient taking differently: Place 2 sprays into both nostrils at bedtime.), Disp: 16 g, Rfl: 11   fluticasone -salmeterol (ADVAIR  DISKUS) 250-50 MCG/ACT AEPB, Inhale 1 puff into the lungs in the morning and at bedtime., Disp: 60 each, Rfl: 11   OZEMPIC, 0.25 OR 0.5 MG/DOSE, 2 MG/3ML SOPN, Inject 0.5 mg into the skin once a week. Wednesday, Disp: , Rfl:    potassium chloride  SA (KLOR-CON  M) 20 MEQ tablet, TAKE 1 TABLET (20 MEQ TOTAL) BY MOUTH DAILY (AM), Disp: 30 tablet, Rfl: 3   spironolactone  (ALDACTONE ) 25 MG tablet, TAKE 2 TABLETS (50 MG TOTAL) BY MOUTH DAILY (2AM), Disp: 90 tablet, Rfl: 3   torsemide  (DEMADEX ) 20 MG tablet, TAKE 3 TABLETS (60 MG  TOTAL) BY MOUTH DAILY., Disp: 180 tablet, Rfl: 3   albuterol  (PROVENTIL ) (2.5 MG/3ML) 0.083% nebulizer solution, Take 3 mLs (2.5 mg total) by nebulization every 6 (six) hours as needed for wheezing or shortness of breath., Disp: 75 mL, Rfl: 5   amLODipine  (NORVASC ) 2.5 MG tablet, Take 1 tablet (2.5 mg total) by mouth daily., Disp: 90 tablet, Rfl: 3   Tiotropium Bromide Monohydrate  (SPIRIVA  RESPIMAT) 2.5 MCG/ACT AERS, Inhale 2 puffs into the lungs daily., Disp: 4 g, Rfl: 11

## 2023-09-26 ENCOUNTER — Encounter: Payer: Self-pay | Admitting: Pulmonary Disease

## 2023-10-06 ENCOUNTER — Other Ambulatory Visit (HOSPITAL_COMMUNITY): Payer: Self-pay | Admitting: Cardiology

## 2023-10-10 ENCOUNTER — Other Ambulatory Visit (HOSPITAL_COMMUNITY): Payer: Self-pay

## 2023-10-10 ENCOUNTER — Telehealth (HOSPITAL_COMMUNITY): Payer: Self-pay | Admitting: Cardiology

## 2023-10-10 MED ORDER — BISOPROLOL FUMARATE 5 MG PO TABS: 5.0000 mg | ORAL_TABLET | Freq: Every day | ORAL | 0 refills | Status: DC

## 2023-10-10 NOTE — Telephone Encounter (Signed)
 Front office left pt a voice message to schedule a follow up appt to receive future refills. Front office received message from nurse Noni Beach, RN, that this pt needs a f/u appt for refills.

## 2023-11-08 ENCOUNTER — Other Ambulatory Visit (HOSPITAL_COMMUNITY): Payer: Self-pay | Admitting: Cardiology

## 2023-12-05 ENCOUNTER — Encounter (HOSPITAL_BASED_OUTPATIENT_CLINIC_OR_DEPARTMENT_OTHER): Payer: Self-pay

## 2023-12-05 ENCOUNTER — Ambulatory Visit (HOSPITAL_BASED_OUTPATIENT_CLINIC_OR_DEPARTMENT_OTHER): Attending: Pulmonary Disease | Admitting: Internal Medicine

## 2023-12-06 ENCOUNTER — Other Ambulatory Visit (HOSPITAL_COMMUNITY): Payer: Self-pay | Admitting: Cardiology

## 2024-01-02 ENCOUNTER — Other Ambulatory Visit (HOSPITAL_COMMUNITY): Payer: Self-pay | Admitting: Cardiology

## 2024-01-06 NOTE — Progress Notes (Signed)
 [facility] - Clinic Note  01/11/2024   CHIEF COMPLAINT Patient presents for Retina Evaluation  HISTORY OF PRESENT ILLNESS: Zachary Hawkins is a 68 y.o. male who presents to the clinic today for:  HPI     Retina Evaluation   In both eyes.  This started 3 years ago.  Duration of 3 years.  Response to treatment was no improvement.  I, the attending physician,  performed the HPI with the patient and updated documentation appropriately.        Comments   New pt DM exam referred by [facility] Pt states he's been borderline diabetic for 3-4 years, last A1C in Jan was 6.5. Pt is on eliquis . Has hx of HTN. Pt is on Ozempic weekly and Farxiga  tablet. No vision changes, pt does feel he needs rx specs. Does not have a regular eye doctor.       Last edited by Valdemar Rogue, MD on 01/12/2024  1:13 PM.     Referring physician: Arloa Jarvis, NP [facility]  HISTORICAL INFORMATION:  Selected notes from the MEDICAL RECORD NUMBER Referred by Jarvis Arloa, NP for diabetic eye exam LEE:  Ocular Hx- PMH-   CURRENT MEDICATIONS: No current outpatient medications on file. (Ophthalmic Drugs)   No current facility-administered medications for this visit. (Ophthalmic Drugs)   Current Outpatient Medications (Other)  Medication Sig   albuterol  (PROVENTIL ) (2.5 MG/3ML) 0.083% nebulizer solution Take 3 mLs (2.5 mg total) by nebulization every 6 (six) hours as needed for wheezing or shortness of breath.   albuterol  (VENTOLIN  HFA) 108 (90 Base) MCG/ACT inhaler Inhale 2 puffs into the lungs every 4 (four) hours as needed for wheezing or shortness of breath.   amLODipine  (NORVASC ) 2.5 MG tablet Take 1 tablet (2.5 mg total) by mouth daily.   amoxicillin  (AMOXIL ) 875 MG tablet Take 875 mg by mouth 2 (two) times daily.   apixaban  (ELIQUIS ) 5 MG TABS tablet Take 1 tablet (5 mg total) by mouth 2 (two) times daily. PLEASE SCHEDULE APPOINTMENT FOR MORE REFILLS   atorvastatin   (LIPITOR) 10 MG tablet Take 1 tablet (10 mg total) by mouth daily. NEEDS FOLLOW UP APPOINTMENT FOR MORE REFILLS   atorvastatin  (LIPITOR) 20 MG tablet Take 20 mg by mouth daily.   bisoprolol  (ZEBETA ) 5 MG tablet Take 1 tablet (5 mg total) by mouth daily. NEEDS FOLLOW UP APPOINTMENT FOR MORE REFILLS   EPINEPHrine  0.3 mg/0.3 mL IJ SOAJ injection Inject 0.3 mg into the muscle as needed for anaphylaxis.   famotidine  (PEPCID ) 20 MG tablet Take 1 tablet (20 mg total) by mouth 2 (two) times daily.   FARXIGA  10 MG TABS tablet Take 1 tablet (10 mg total) by mouth daily. PLEASE SCHEDULE APPOINTMENT FOR MORE REFILLS   fluticasone  (FLONASE ) 50 MCG/ACT nasal spray PLACE 1 SPRAY INTO BOTH NOSTRILS DAILY.   fluticasone -salmeterol (ADVAIR  DISKUS) 250-50 MCG/ACT AEPB Inhale 1 puff into the lungs in the morning and at bedtime.   OZEMPIC, 0.25 OR 0.5 MG/DOSE, 2 MG/3ML SOPN Inject 0.5 mg into the skin once a week. Wednesday   potassium chloride  SA (KLOR-CON  M) 20 MEQ tablet Take 1 tablet (20 mEq total) by mouth daily. PLEASE SCHEDULE APPOINTMENT FOR MORE REFILLS   spironolactone  (ALDACTONE ) 25 MG tablet Take 1 tablet (25 mg total) by mouth daily. NEEDS FOLLOW UP APPOINTMENT FOR MORE REFILLS   Tiotropium Bromide Monohydrate  (SPIRIVA  RESPIMAT) 2.5 MCG/ACT AERS Inhale 2 puffs into the lungs daily.   torsemide  (DEMADEX ) 20  MG tablet TAKE 3 TABLETS (60 MG TOTAL) BY MOUTH DAILY.   No current facility-administered medications for this visit. (Other)   REVIEW OF SYSTEMS: ROS   Positive for: Endocrine, Cardiovascular Negative for: Constitutional, Gastrointestinal, Neurological, Skin, Genitourinary, Musculoskeletal, HENT, Eyes, Respiratory, Psychiatric, Allergic/Imm, Heme/Lymph Last edited by Antonetta Almetta BRAVO, COT on 01/11/2024 12:43 PM.     ALLERGIES Allergies  Allergen Reactions   Ace Inhibitors Anaphylaxis   Black Cherry Fruit Extract Carolee Extract] Anaphylaxis    Tongue   Grenadine Flavor [Flavoring Agent  (Non-Screening)] Anaphylaxis   PAST MEDICAL HISTORY Past Medical History:  Diagnosis Date   Asthma    BPH (benign prostatic hyperplasia)    Cardiomyopathy (HCC) 06/2017   EF 40-45%   CHF (congestive heart failure) (HCC)    Chronic renal insufficiency, stage 3 (moderate) (HCC)    COPD (chronic obstructive pulmonary disease) (HCC)    Diabetes mellitus without complication (HCC)    Diastolic dysfunction 06/2017   grade 2    Hyperlipidemia    Hypertension    Hypertensive urgency    Obesity    Obesity    OSA (obstructive sleep apnea)    Oxygen dependent    Paroxysmal atrial fibrillation (HCC) 2022   on Eliquis    Past Surgical History:  Procedure Laterality Date   ETHMOIDECTOMY Bilateral 06/16/2023   Procedure: TOTAL ETHMOIDECTOMY;  Surgeon: Carlie Clark, MD;  Location: [facility] OR;  Service: ENT;  Laterality: Bilateral;   FRONTAL SINUS EXPLORATION Bilateral 06/16/2023   Procedure: NASAL FRONTAL SINUS EXPLORATION;  Surgeon: Carlie Clark, MD;  Location: [facility] OR;  Service: ENT;  Laterality: Bilateral;   MAXILLARY ANTROSTOMY Bilateral 06/16/2023   Procedure: MAXILLARY ANTROSTOMY;  Surgeon: Carlie Clark, MD;  Location: [facility] OR;  Service: ENT;  Laterality: Bilateral;   SINUS ENDO W/FUSION Bilateral 06/16/2023   Procedure: ENDOSCOPIC SINUS SURGERY WITH NAVIGATION;  Surgeon: Carlie Clark, MD;  Location: [facility] OR;  Service: ENT;  Laterality: Bilateral;   SPHENOIDECTOMY Bilateral 06/16/2023   Procedure: SPHENOIDOTOMY;  Surgeon: Carlie Clark, MD;  Location: [facility] OR;  Service: ENT;  Laterality: Bilateral;   FAMILY HISTORY Family History  Problem Relation Age of Onset   Diabetes Mother    Diabetes Father    SOCIAL HISTORY Social History   Tobacco Use   Smoking status: Former    Current packs/day: 0.00    Average packs/day: 1 pack/day for 36.0 years (36.0 ttl pk-yrs)    Types: Cigarettes    Start date: 01/08/1985    Quit date: 01/08/2021    Years since quitting: 3.0   Smokeless tobacco: Never  Vaping  Use   Vaping status: Never Used  Substance Use Topics   Alcohol use: Yes    Alcohol/week: 1.0 standard drink of alcohol    Types: 1 Shots of liquor per week    Comment: 1-2 glasses a week   Drug use: Not Currently    Types: Marijuana       OPHTHALMIC EXAM:  Base Eye Exam     Visual Acuity (Snellen - Linear)       Right Left   Dist [place] 20/30 +1 20/30   Dist ph [place] 20/20 -2 20/20 -1         Tonometry (Tonopen, 12:55 PM)       Right Left   Pressure 21 23  Pt squeezing MS        Pupils       Dark Light Shape React APD   Right 2 1 Round Minimal None  Left 2 1 Round Minimal None         Visual Fields (Counting fingers)       Left Right    Full Full         Extraocular Movement       Right Left    Full, Ortho Full, Ortho         Neuro/Psych     Oriented x3: Yes   Mood/Affect: Normal         Dilation     Both eyes: 1.0% Mydriacyl, 2.5% Phenylephrine  @ 12:57 PM           Slit Lamp and Fundus Exam     External Exam       Right Left   External Normal Normal         Slit Lamp Exam       Right Left   Lids/Lashes Normal Normal   Conjunctiva/Sclera Melanosis Melanosis   Cornea Arcus, Debris in tear film Arcus, Debris in tear film   Anterior Chamber Deep and clear Deep and clear   Iris Round and moderately dilated, No NVI Round and moderately dilated, No NVI   Lens 2+ Nuclear sclerosis, 2+ Cortical cataract 2+ Nuclear sclerosis, 2+ Cortical cataract   Anterior Vitreous Vitreous syneresis Vitreous syneresis         Fundus Exam       Right Left   Disc Pink and sharp Pink and sharp, mild PPA   C/D Ratio 0.4 0.5   Macula Flat, Good foveal reflex, No heme or edema Flat, Good foveal reflex, No heme or edema   Vessels Vascular attenuation, Tortuous Vascular attenuation, Tortuous   Periphery Attached, No heme Attached, No heme           Refraction     Manifest Refraction       Sphere Cylinder Axis Dist VA   Right -2.00  +2.75 095 20/20   Left -2.75 +4.00 082 20/20           IMAGING AND PROCEDURES  Imaging and Procedures for 01/11/2024  OCT, Retina - OU - Both Eyes       Right Eye Quality was good. Central Foveal Thickness: 237. Progression has no prior data. Findings include normal foveal contour, no IRF, no SRF, myopic contour, vitreomacular adhesion .   Left Eye Quality was good. Central Foveal Thickness: 241. Progression has no prior data. Findings include normal foveal contour, no IRF, no SRF, myopic contour, vitreomacular adhesion .   Notes *Images captured and stored on drive  Diagnosis / Impression:  NFP; no IRF/SRF OU No DME OU   Clinical management:  See below  Abbreviations: NFP - Normal foveal profile. CME - cystoid macular edema. PED - pigment epithelial detachment. IRF - intraretinal fluid. SRF - subretinal fluid. EZ - ellipsoid zone. ERM - epiretinal membrane. ORA - outer retinal atrophy. ORT - outer retinal tubulation. SRHM - subretinal hyper-reflective material. IRHM - intraretinal hyper-reflective material           ASSESSMENT/PLAN:   ICD-10-CM   1. Diabetes mellitus type 2 without retinopathy (HCC)  E11.9 OCT, Retina - OU - Both Eyes    2. Diabetes mellitus treated with injections of non-insulin  medication (HCC)  E11.9    Z79.85     3. Essential hypertension  I10     4. Hypertensive retinopathy of both eyes  H35.033     5. Combined forms of age-related cataract of both eyes  H25.813  1-2. Diabetes mellitus, type 2 without retinopathy  - A1C 6.3 on 08.07.25  - currently on Ozempic only - The incidence, risk factors for progression, natural history and treatment options for diabetic retinopathy  were discussed with patient.   - The need for close monitoring of blood glucose, blood pressure, and serum lipids, avoiding cigarette or any type of tobacco, and the need for long term follow up was also discussed with patient. - BCVA 20/20 OU - OCT shows No DME  OU - f/u in 1 year, sooner prn, DFE, OCT   3,4. Hypertensive retinopathy OU - discussed importance of tight BP control - monitor   5. Mixed Cataract OU - The symptoms of cataract, surgical options, and treatments and risks were discussed with patient. - discussed diagnosis and progression - monitor   Ophthalmic Meds Ordered this visit:  No orders of the defined types were placed in this encounter.    Return in about 1 year (around 01/10/2025) for f/u DM exam , DFE, OCT.  There are no Patient Instructions on file for this visit.  Explained the diagnoses, plan, and follow up with the patient and they expressed understanding.  Patient expressed understanding of the importance of proper follow up care.    This document serves as a record of services personally performed by Redell JUDITHANN Hans, MD, PhD. It was created on their behalf by Almetta Pesa, an ophthalmic technician. The creation of this record is the provider's dictation and/or activities during the visit.    Electronically signed by: Almetta Pesa, OA, 01/12/24  1:17 PM  This document serves as a record of services personally performed by Redell JUDITHANN Hans, MD, PhD. It was created on their behalf by Wanda GEANNIE Keens, COT an ophthalmic technician. The creation of this record is the provider's dictation and/or activities during the visit.    Electronically signed by:  Wanda GEANNIE Keens, COT  01/12/24 1:17 PM  Redell JUDITHANN Hans, M.D., Ph.D. Diseases & Surgery of the Retina and Vitreous [facility] 01/11/2024  I have reviewed the above documentation for accuracy and completeness, and I agree with the above. Redell JUDITHANN Hans, M.D., Ph.D. 01/12/24 1:19 PM   Abbreviations: M myopia (nearsighted); A astigmatism; H hyperopia (farsighted); P presbyopia; Mrx spectacle prescription;  CTL contact lenses; OD right eye; OS left eye; OU both eyes  XT exotropia; ET esotropia; PEK punctate epithelial keratitis; PEE  punctate epithelial erosions; DES dry eye syndrome; MGD meibomian gland dysfunction; ATs artificial tears; PFAT's preservative free artificial tears; NSC nuclear sclerotic cataract; PSC posterior subcapsular cataract; ERM epi-retinal membrane; PVD posterior vitreous detachment; RD retinal detachment; DM diabetes mellitus; DR diabetic retinopathy; NPDR non-proliferative diabetic retinopathy; PDR proliferative diabetic retinopathy; CSME clinically significant macular edema; DME diabetic macular edema; dbh dot blot hemorrhages; CWS cotton wool spot; POAG primary open angle glaucoma; C/D cup-to-disc ratio; HVF humphrey visual field; GVF goldmann visual field; OCT optical coherence tomography; IOP intraocular pressure; BRVO Branch retinal vein occlusion; CRVO central retinal vein occlusion; CRAO central retinal artery occlusion; BRAO branch retinal artery occlusion; RT retinal tear; SB scleral buckle; PPV pars plana vitrectomy; VH Vitreous hemorrhage; PRP panretinal laser photocoagulation; IVK intravitreal kenalog; VMT vitreomacular traction; MH Macular hole;  NVD neovascularization of the disc; NVE neovascularization elsewhere; AREDS age related eye disease study; ARMD age related macular degeneration; POAG primary open angle glaucoma; EBMD epithelial/anterior basement membrane dystrophy; ACIOL anterior chamber intraocular lens; IOL intraocular lens; PCIOL posterior chamber intraocular lens; Phaco/IOL phacoemulsification with intraocular  lens placement; PRK photorefractive keratectomy; LASIK laser assisted in situ keratomileusis; HTN hypertension; DM diabetes mellitus; COPD chronic obstructive pulmonary disease

## 2024-01-11 ENCOUNTER — Encounter (INDEPENDENT_AMBULATORY_CARE_PROVIDER_SITE_OTHER): Payer: Self-pay | Admitting: Ophthalmology

## 2024-01-11 ENCOUNTER — Ambulatory Visit (INDEPENDENT_AMBULATORY_CARE_PROVIDER_SITE_OTHER): Admitting: Ophthalmology

## 2024-01-11 DIAGNOSIS — I10 Essential (primary) hypertension: Secondary | ICD-10-CM

## 2024-01-11 DIAGNOSIS — H25013 Cortical age-related cataract, bilateral: Secondary | ICD-10-CM

## 2024-01-11 DIAGNOSIS — Z7985 Long-term (current) use of injectable non-insulin antidiabetic drugs: Secondary | ICD-10-CM

## 2024-01-11 DIAGNOSIS — H3581 Retinal edema: Secondary | ICD-10-CM

## 2024-01-11 DIAGNOSIS — E119 Type 2 diabetes mellitus without complications: Secondary | ICD-10-CM | POA: Diagnosis not present

## 2024-01-11 DIAGNOSIS — H35033 Hypertensive retinopathy, bilateral: Secondary | ICD-10-CM

## 2024-01-11 DIAGNOSIS — Z794 Long term (current) use of insulin: Secondary | ICD-10-CM

## 2024-01-11 DIAGNOSIS — H25813 Combined forms of age-related cataract, bilateral: Secondary | ICD-10-CM

## 2024-01-12 ENCOUNTER — Encounter (INDEPENDENT_AMBULATORY_CARE_PROVIDER_SITE_OTHER): Payer: Self-pay | Admitting: Ophthalmology

## 2024-01-30 ENCOUNTER — Ambulatory Visit (HOSPITAL_BASED_OUTPATIENT_CLINIC_OR_DEPARTMENT_OTHER): Attending: Pulmonary Disease | Admitting: Internal Medicine

## 2024-01-30 DIAGNOSIS — G4733 Obstructive sleep apnea (adult) (pediatric): Secondary | ICD-10-CM | POA: Diagnosis present

## 2024-02-05 DIAGNOSIS — G4733 Obstructive sleep apnea (adult) (pediatric): Secondary | ICD-10-CM | POA: Diagnosis not present

## 2024-02-05 NOTE — Procedures (Signed)
 [facility] [address] [place], [place] [address] Tel: [phone]   Fax: [phone]  Polysomnography Interpretation  Patient Name:  Zachary Hawkins, Zachary Hawkins Date:  01/30/2024 Referring Physician:  DORN CHILL [phone]) %%startinterp%% Indications for Polysomnography The patient is a 68 year-old Male who is 6' and weighs 340.0 lbs. His BMI equals 46.6.  A full night polysomnogram was performed to evaluate for -OSA.  Medication  No Data.   Polysomnogram Data A full night polysomnogram recorded the standard physiologic parameters including EEG, EOG, EMG, EKG, nasal and oral airflow.  Respiratory parameters of chest and abdominal movements were recorded with Respiratory Inductance Plethysmography belts.  Oxygen saturation was recorded by pulse oximetry.   Sleep Architecture The total recording time of the polysomnogram was 369.6 minutes.  The total sleep time was 237.5 minutes.  The patient spent 14.7% of total sleep time in Stage N1, 64.6% in Stage N2, 0.0% in Stages N3, and 20.6% in REM.  Sleep latency was 10.3 minutes.  REM latency was 122.0 minutes.  Sleep Efficiency was 64.2%.  Wake after Sleep Onset time was 121.5 minutes.  Respiratory Events The polysomnogram revealed a presence of 2 obstructive, - central, and - mixed apneas resulting in an Apnea index of 0.5 events per hour.  There were 113 hypopneas (>=3% desaturation and/or arousal) resulting in an Apnea\Hypopnea Index (AHI >=3% desaturation and/or arousal) of 29.1 events per hour.  There were 57 hypopneas (>=4% desaturation) resulting in an Apnea\Hypopnea Index (AHI >=4% desaturation) of 14.9 events per hour.  There were 171 Respiratory Effort Related Arousals resulting in a RERA index of 43.2 events per hour. The Respiratory Disturbance Index is 72.3 events per hour.  The snore index was - events per hour.  Mean oxygen saturation was 94.6%.  The lowest oxygen saturation during sleep  was 69.0%.  Time spent <=88% oxygen saturation was 20.7 minutes (6.0%).  Limb Activity There were 12 total limb movements recorded, of this total, 12 were classified as PLMs.  PLM index was 3.0 per hour and PLM associated with Arousals index was 0.3 per hour.  Cardiac Summary The average pulse rate was 88.7 bpm.  The minimum pulse rate was 64.0 bpm while the maximum pulse rate was 102.0 bpm.  Cardiac rhythm was normal-PVCs.  Comments: Mild to moderate obstructive sleep apnea, AHI(4%) 14.9/hr. Snoring with oxygen desaturation to a nadir of 69%, mean 94.6%.  Diagnosis: Obstructive sleep apnea  Recommendations: Suggest autopap, return for CPAP titration study, or consider fitted oral appliance.   This study was personally reviewed and electronically signed by: Dr. Reggy Salt Accredited Board Certified in Sleep Medicine Date/Time: 02/05/24   12:34    %%endinterp%%   Diagnostic PSG Report  Patient Name: Zachary Hawkins, Zachary Hawkins Study Date: 01/30/2024  Date of Birth: 05/24/1955 Study Type: Diagnostic  Age: 13 year MRN #: [id number]  Sex: Male Interpreting Physician: SALT REGGY, [id number]  Height: 6' Referring Physician: DORN CHILL [phone])  Weight: 340.0 lbs Recording Tech: Orie Sires RRT RPSGT RST  BMI: 46.6 Scoring Tech: Orie Sires RRT RPSGT RST  ESS: 8 Neck Size: 21   Study Overview  Lights Off: 10:47:40 PM  Count Index  Lights On: 04:57:19 AM Awakenings: 60 15.2  Time in Bed: 369.6 min. Arousals: 247 62.4  Total Sleep Time: 237.5 min. AHI (>=3% Desat and/or Ar.): 115 29.1   Sleep Efficiency: 64.2% AHI (>=4% Desat): 59 14.9   Sleep Latency: 10.3 min. Limb Movements: 12 3.0  Wake  After Sleep Onset: 121.5 min. Snore: - -  REM Latency from Sleep Onset: 122.0 min. Desaturations: 103 26.0     Minimum SpO2 TST: 69.0%    Sleep Architecture  % of Time in Bed Stages Time (mins) % Sleep Time  Wake 132.5   Stage N1 35.0 14.7%  Stage N2 153.5 64.6%  Stage N3 0.0 0.0%  REM 49.0  20.6%   Arousal Summary   NREM REM Sleep Index  Respiratory Arousals 167 39 206 52.0  PLM Arousals 1 - 1 0.3  Isolated Limb Movement Arousals - - - -  Snore Arousals - - - -  Spontaneous Arousals 37 3 40 10.1  Total 205 42 247 62.4   Limb Movement Summary   Count Index  Isolated Limb Movements - -  Periodic Limb Movements (PLMs) 12 3.0  Total Limb Movements 12 3.0    Respiratory Summary   By Sleep Stage By Body Position Total   NREM REM Supine Non-Supine   Time (min) 188.5 49.0 - 237.5 237.5         Obstructive Apnea - 2 - 2 2  Mixed Apnea - - - - -  Central Apnea - - - - -  Total Apneas - 2 - 2 2  Total Apnea Index - 2.4 - 0.5 0.5         Hypopneas (>=3% Desat and/or Ar.) 74 39 - 113 113  AHI (>=3% Desat and/or Ar.) 23.6 50.2 - 29.1 29.1         Hypopneas (>=4% Desat) 29 28 - 57 57  AHI (>=4% Desat) 9.2 36.7 - 14.9 14.9          RERAs 147 24 - 171 171  RERA Index 46.8 29.4 - 43.2 43.2         RDI 70.3 79.6 - 72.3 72.3    Respiratory Event Type Index  Central Apneas -  Obstructive Apneas 0.5  Mixed Apneas -  Central Hypopneas -  Obstructive Hypopneas 28.8  Central Apnea + Hypopnea (CAHI) -  Obstructive Apnea + Hypopnea (OAHI) 29.3   Respiratory Event Durations   Apnea Hypopnea   NREM REM NREM REM  Average (seconds) - 15.4 24.9 29.3  Maximum (seconds) - 15.5 49.0 64.4    Oxygen Saturation Summary   Wake NREM REM TST TIB  Average SpO2 (%) 94.9% 95.3% 91.0% 94.4% 94.6%  Minimum SpO2 (%) 78.0% 87.0% 69.0% 69.0% 69.0%  Maximum SpO2 (%) 100.0% 100.0% 98.0% 100.0% 100.0%   Oxygen Saturation Distribution  Range (%) Time in range (min) Time in range (%)  90.0 - 100.0 312.7 90.2%  80.0 - 90.0 30.3 8.7%  70.0 - 80.0 3.4 1.0%  60.0 - 70.0 0.1 0.0%  50.0 - 60.0 - -  0.0 - 50.0 - -  Time Spent <=88% SpO2  Range (%) Time in range (min) Time in range (%)  0.0 - 88.0 20.7 6.0%      Count Index  Desaturations 103 26.0    Cardiac Summary   Wake  NREM REM Sleep Total  Average Pulse Rate (BPM) 87.8 88.7 90.6 89.1 88.7  Minimum Pulse Rate (BPM) 68.0 73.0 64.0 64.0 64.0  Maximum Pulse Rate (BPM) 102.0 99.0 102.0 102.0 102.0   Pulse Rate Distribution:  Range (bpm) Time in range (min) Time in range (%)  0.0 - 40.0 - -  40.0 - 60.0 - -  60.0 - 80.0 4.1 1.2%  80.0 - 100.0 326.3 93.9%  100.0 - 120.0 0.6  0.2%  120.0 - 140.0 - -  140.0 - 200.0 - -      Hypnograms                      Technologist Comments  Patient was ordered as a split night study. Patient is a 68 year old African American male who was sent to the sleep center for OSA. Patient did not meet split night criteria per standing protocol. Patient did not have sufficient  sleep to warrant CPAP trial per standing protocol. Patient arrived a sleep center on 3 lpm of oxygen via nasal cannula stating that he wears his oxygen 24/7. Thus, patient's sleep study was done with patient on oxygen at 3 lpm per lab protocol and physician order. No medications were reported taken. Occasioanl PLMA's/PLMA's were noted. Questionable cardiac arrhythmias were noted see attached sheets and epochs for examples: 26, 213, 237, 281, 314, 486, 532, 558,  etc. Four restroom visits were noted. Patient was fitted with a [employer] Simplus nasal/oral full face mask size large.                            Reggy Salt Diplomate, Biomedical engineer of Sleep Medicine  ELECTRONICALLY SIGNED ON:  02/05/2024, 12:28 PM [facility] PH: [phone]   FX: [phone] ACCREDITED BY THE AMERICAN ACADEMY OF SLEEP MEDICINE

## 2024-02-28 ENCOUNTER — Other Ambulatory Visit (HOSPITAL_COMMUNITY): Payer: Self-pay | Admitting: Cardiology

## 2024-03-19 ENCOUNTER — Encounter: Payer: Self-pay | Admitting: Pulmonary Disease

## 2024-03-19 ENCOUNTER — Telehealth (HOSPITAL_COMMUNITY): Payer: Self-pay

## 2024-03-19 ENCOUNTER — Ambulatory Visit: Admitting: Pulmonary Disease

## 2024-03-19 VITALS — BP 129/82 | HR 93 | Ht 73.0 in | Wt 322.0 lb

## 2024-03-19 DIAGNOSIS — J449 Chronic obstructive pulmonary disease, unspecified: Secondary | ICD-10-CM

## 2024-03-19 DIAGNOSIS — G4733 Obstructive sleep apnea (adult) (pediatric): Secondary | ICD-10-CM

## 2024-03-19 DIAGNOSIS — J9611 Chronic respiratory failure with hypoxia: Secondary | ICD-10-CM | POA: Diagnosis not present

## 2024-03-19 NOTE — Patient Instructions (Addendum)
 We will order you a CPAP machine and supplies with face mask fitting session. - start CPAP 5-15cmH2O auto-titration with humidification and 3L of oxygen  Continue spiriva  2 puffs daily  Continue Advair  Diskus 1 puff twice daily - rinse mouth out after each use  Call us  to schedule a follow up visit 1 month after you start your CPAP machine

## 2024-03-19 NOTE — Progress Notes (Incomplete)
 ADVANCED HF CLINIC NOTE  Primary Care: McVey, Almarie Folks, PA-C Primary Cardiologist: Dr. Francyne HF Cardiologist: Dr. Rolan  HPI: Mr Cavenaugh is a 68 y.o. with a h/o HFpEF, OSA, CPAP, smoker, CKD Stage III, HTN, obesity,and PAF. Has had multiple ED/hospital visits over the last year.  Ongoing compliance issues.   Echo 12/22 EF 60-65%, RV ok.    Admitted 03/2022 with A/C hypoxic/hypercarbic respiratory failure and volume overload. Treated with antibiotics steroids, nebs and diuretics.    Admitted 04/29/22 with increased shortness of breath in the setting of HF exacerbation. Diuresed with IV lasix  and transitioned to lasix  80 mg twice a day   Evaluated in the ED 05/2021 with COPD exacerbation. Treated with nebulizers and steroids.    Admitted 06/23/21 with A/C respiratory failure and heart exacerbation. Placed on Bipap with improvement. Diuresed with IV lasix  and transitioned to torsemide  100 mg daily. D/C weigth 344 pounds. Discharged 06/26/21 .   Seen in [facility] 3/23, volume up, likely in setting of high salt intake. Instructed to take extra torsemide  50 mg daily.   Admitted 07/17/21 with a/c CHF, diuresed with IV lasix . Discharged home, weight 338 lbs.  Post hospital follow up 3/23, NYHA III, volume OK on exam.   Admitted 4/23 with acute on chronic respiratory failure 2/2 CPAP noncompliance and unintentional pain medication overdose. Failed NIV and required intubation. CHF compensated.  Admitted 5/23 with SOB and AKI. GDMT held. Weight down to 325 lbs at discharge, new SCr baseline 1.5. Amlodipine , torsemide  and spiro stopped at discharge. Saw general cardiology at follow up and torsemide  restarted at 60 mg daily and spiro at 25 mg daily.  Admitted 12/26 -05/08/23 with A/C respiratory failure and A/C HFpEF. Diuresed with IV lasix  and transitioned to torsemide  80 mg daily.   He presents today for routine f/u and f/u echo.  Echo done today shows EF 60-65%, Severe LVH, no significant  valvular abnormalities, RV not well visualized. IVC not dilated. He reports chronic NYHA Class II-early II symptoms, confounded by morbid obesity and deconditioning. Reports compliance w/ med regimen and diuretics. Wt is down 4 lb from previous visit. BP elevated, 140/90. Denies CP. Continues on chronic home O2 and followed by pulmonology. He admits to dietary indiscretion w/ high sodium/ processed foods. Also high sugar diet. Drinks regular soft drinks and fruit juices. Just started Ozempic.    EKG: Not performed   PMH: 1. CKD stage 3 2. OSA: Uses CPAP.  3. HTN 4. Atrial fibrillation: Paroxysmal.  5. COPD: Prior smoker, quit in early 2023.  - Uses 2 L home oxygen.  6. Erectile dysfunction 7. Chronic diastolic CHF: Echo (12/22) with EF 60-65%, normal RV, normal IVC.  8. Hyperlipidemia  ROS: All systems reviewed and negative except as per HPI.   Current Outpatient Medications  Medication Sig Dispense Refill   albuterol  (PROVENTIL ) (2.5 MG/3ML) 0.083% nebulizer solution Take 3 mLs (2.5 mg total) by nebulization every 6 (six) hours as needed for wheezing or shortness of breath. 75 mL 5   albuterol  (VENTOLIN  HFA) 108 (90 Base) MCG/ACT inhaler Inhale 2 puffs into the lungs every 4 (four) hours as needed for wheezing or shortness of breath. 18 g 6   amLODipine  (NORVASC ) 2.5 MG tablet Take 1 tablet (2.5 mg total) by mouth daily. 90 tablet 3   amoxicillin  (AMOXIL ) 875 MG tablet Take 875 mg by mouth 2 (two) times daily.     apixaban  (ELIQUIS ) 5 MG TABS tablet Take 1 tablet (5 mg total) by  mouth 2 (two) times daily. PLEASE SCHEDULE APPOINTMENT FOR MORE REFILLS 60 tablet 0   atorvastatin  (LIPITOR) 10 MG tablet Take 1 tablet (10 mg total) by mouth daily. NEEDS FOLLOW UP APPOINTMENT FOR MORE REFILLS 90 tablet 1   atorvastatin  (LIPITOR) 20 MG tablet Take 20 mg by mouth daily.     bisoprolol  (ZEBETA ) 5 MG tablet Take 1 tablet (5 mg total) by mouth daily. PLEASE SCHEDULE APPOINTMENT FOR MORE REFILLS 30  tablet 0   EPINEPHrine  0.3 mg/0.3 mL IJ SOAJ injection Inject 0.3 mg into the muscle as needed for anaphylaxis. 1 each 1   famotidine  (PEPCID ) 20 MG tablet Take 1 tablet (20 mg total) by mouth 2 (two) times daily. 28 tablet 0   FARXIGA  10 MG TABS tablet Take 1 tablet (10 mg total) by mouth daily. PLEASE SCHEDULE APPOINTMENT FOR MORE REFILLS 30 tablet 0   fluticasone  (FLONASE ) 50 MCG/ACT nasal spray PLACE 1 SPRAY INTO BOTH NOSTRILS DAILY. 16 g 11   fluticasone -salmeterol (ADVAIR  DISKUS) 250-50 MCG/ACT AEPB Inhale 1 puff into the lungs in the morning and at bedtime. 60 each 11   OZEMPIC, 0.25 OR 0.5 MG/DOSE, 2 MG/3ML SOPN Inject 0.5 mg into the skin once a week. Wednesday     potassium chloride  SA (KLOR-CON  M) 20 MEQ tablet Take 1 tablet (20 mEq total) by mouth daily. PLEASE SCHEDULE APPOINTMENT FOR MORE REFILLS 30 tablet 0   spironolactone  (ALDACTONE ) 25 MG tablet Take 1 tablet (25 mg total) by mouth daily. NEEDS FOLLOW UP APPOINTMENT FOR MORE REFILLS 90 tablet 1   Tiotropium Bromide Monohydrate  (SPIRIVA  RESPIMAT) 2.5 MCG/ACT AERS Inhale 2 puffs into the lungs daily. 4 g 11   torsemide  (DEMADEX ) 20 MG tablet TAKE 3 TABLETS (60 MG TOTAL) BY MOUTH DAILY. 180 tablet 3   No current facility-administered medications for this visit.   Allergies  Allergen Reactions   Ace Inhibitors Anaphylaxis   Black Cherry Fruit Extract Carolee Extract] Anaphylaxis    Tongue   Tour Manager (Non-Screening)] Anaphylaxis   Social History   Socioeconomic History   Marital status: Single    Spouse name: Not on file   Number of children: Not on file   Years of education: Not on file   Highest education level: Not on file  Occupational History   Not on file  Tobacco Use   Smoking status: Former    Current packs/day: 0.00    Average packs/day: 1 pack/day for 36.0 years (36.0 ttl pk-yrs)    Types: Cigarettes    Start date: 01/08/1985    Quit date: 01/08/2021    Years since quitting: 3.1    Smokeless tobacco: Never  Vaping Use   Vaping status: Never Used  Substance and Sexual Activity   Alcohol use: Yes    Alcohol/week: 1.0 standard drink of alcohol    Types: 1 Shots of liquor per week    Comment: 1-2 glasses a week   Drug use: Not Currently    Types: Marijuana   Sexual activity: Not on file  Other Topics Concern   Not on file  Social History Narrative   Not on file   Social Drivers of Health   Financial Resource Strain: High Risk (07/15/2021)   Overall Financial Resource Strain (CARDIA)    Difficulty of Paying Living Expenses: Very hard  Food Insecurity: Low Risk  (02/21/2024)   Received from [facility]   Hunger Vital Sign    Within the past 12 months, you worried that your  food would run out before you got money to buy more: Never true    Within the past 12 months, the food you bought just didn't last and you didn't have money to get more. : Never true  Transportation Needs: No Transportation Needs (02/21/2024)   Received from [facility]    In the past 12 months, has lack of reliable transportation kept you from medical appointments, meetings, work or from getting things needed for daily living? : No  Physical Activity: Not on file  Stress: Not on file  Social Connections: Unknown (09/22/2021)   Received from [facility]   Social Network    Social Network: Not on file  Intimate Partner Violence: Unknown (08/14/2021)   Received from [facility]   HITS    Physically Hurt: Not on file    Insult or Talk Down To: Not on file    Threaten Physical Harm: Not on file    Scream or Curse: Not on file   Family History  Problem Relation Age of Onset   Diabetes Mother    Diabetes Father    There were no vitals taken for this visit.  Wt Readings from Last 3 Encounters:  03/19/24 (!) 146.1 kg (322 lb)  01/30/24 (!) 154.2 kg (340 lb)  09/20/23 (!) 161.5 kg (356 lb)   PHYSICAL EXAM: General:  supper morbidly obese. No respiratory  difficulty HEENT: normal Neck: supple. Thick neck, JVD not well visualized. Carotids 2+ bilat; no bruits. No lymphadenopathy or thyromegaly appreciated. Cor: PMI nondisplaced. Regular rate & rhythm. No rubs, gallops or murmurs. Lungs: clear Abdomen: obese, soft, nontender, nondistended. No hepatosplenomegaly. No bruits or masses. Good bowel sounds. Extremities: no cyanosis, clubbing, rash, edema Neuro: alert & oriented x 3, cranial nerves grossly intact. moves all 4 extremities w/o difficulty. Affect pleasant.    ASSESSMENT & PLAN: 1. Chronic diastolic heart failure: Echo in 12/22 with EF 60-65%, RV normal. Multiple admissions for CHF in 2023. Echo today EF 60-65%, Severe LVH, no significant valvular abnormalities, RV not well visualized. IVC not dilated.  - NYHA II-early III, counfounded by super morbid obesity, deconditioning, COPD and suspected OHS - Wt down 4lb from prior visit. Volume assessment difficult given body habitus/ obesity but suspect euvolemic based on IVC measurement on today's echo  - continue torsemide  80 mg daily. Check BNP and BMP today  - Increase bisoprolol  to 5 mg daily.  - Angioedema with ACEI so not a candidate for ARNI.  - Continue Farxiga  10 mg daily. - Continue spironolactone  25 mg daily. - add amlodipine  2.5 mg daily to help w/ BP control (doubt he would comply w/ tid hydralazine )  - ? Cardiomems, but he has Medicaid => he would be interested, may be able to get this when he goes on Medicare. Also may not be a suitable candidate given chest circumference. Reconsider if wt loss   2. Atrial fibrillation: Paroxysmal. RRR on exam today  - Continue bisoprolol .  - Continue Eliquis  5 mg bid. No bleeding issues. Check CBC today  3. OSA: Reinforced compliance w/ Bipap.  4. COPD: Prior smoker.  Uses home oxygen.  - followed by pulmonology, Dr. Gladis  5. Obesity: There is no height or weight on file to calculate BMI. - He is now on Ozempic 0.25 mg daily, just  started. - discussed smaller food portions and reduction of sugar intake (elimination of soft drinks and fruit juices) 6. CKD Stage IIIa: Baseline around 1.3-1.6.  - Continue SGLT2i. denies  GU symptoms - check BMP today  7. Hyperlipidemia: Good lipids 9/23. 8. Hypertension: moderately elevated. Echo shows LVH  - increase bisoprolol  to 5 mg daily  - add amlodipine  2.5 mg daily  - f/u w/ pharmD in 2 wks to recheck BP and further adjust meds  F/u w/ Pharm D in 2 wks and APP in 4-6 wks   Harlene CHRISTELLA Gainer, [place]   03/19/2024

## 2024-03-19 NOTE — Progress Notes (Signed)
 Established Patient Pulmonology Office Visit   Subjective:  Patient ID: Zachary Hawkins, male    DOB: 30-Jan-1956  MRN: [id number]  CC: No chief complaint on file.   Discussed the use of AI scribe software for clinical note transcription with the patient, who gave verbal consent to proceed.  History of Present Illness Zachary Hawkins is a 68 year old male former smoker with history of HFrEF, OSA, atrial fibrillation, obesity and DMII who returns to pulmonary clinic for follow up of COPD.   His breathing is manageable with Spiriva  Respimat and Advair  Diskus inhalers. He uses two puffs of Spiriva  in the morning and one puff of Advair  in the morning and evening. He confirms correct usage of these inhalers.  He is taking Ozempic and has lost over thirty pounds since May, though he does not perceive the change. He carries significant abdominal weight, affecting his breathing, especially when bending over.  He has a history of smoking marijuana and is considering switching to edibles to reduce lung irritation.  A sleep study showed episodes of hypopnea or apnea approximately 14.9 times per hour, with oxygen levels dropping to 69% during these episodes and an average oxygen level of 94%. He uses 3 liters of oxygen at night and can walk short distances without it, though he experiences shortness of breath with overexertion.        ROS    Current Outpatient Medications:    albuterol  (PROVENTIL ) (2.5 MG/3ML) 0.083% nebulizer solution, Take 3 mLs (2.5 mg total) by nebulization every 6 (six) hours as needed for wheezing or shortness of breath., Disp: 75 mL, Rfl: 5   albuterol  (VENTOLIN  HFA) 108 (90 Base) MCG/ACT inhaler, Inhale 2 puffs into the lungs every 4 (four) hours as needed for wheezing or shortness of breath., Disp: 18 g, Rfl: 6   amLODipine  (NORVASC ) 2.5 MG tablet, Take 1 tablet (2.5 mg total) by mouth daily., Disp: 90 tablet, Rfl: 3   amoxicillin  (AMOXIL ) 875 MG tablet, Take 875 mg by  mouth 2 (two) times daily., Disp: , Rfl:    apixaban  (ELIQUIS ) 5 MG TABS tablet, Take 1 tablet (5 mg total) by mouth 2 (two) times daily. PLEASE SCHEDULE APPOINTMENT FOR MORE REFILLS, Disp: 60 tablet, Rfl: 0   atorvastatin  (LIPITOR) 10 MG tablet, Take 1 tablet (10 mg total) by mouth daily. NEEDS FOLLOW UP APPOINTMENT FOR MORE REFILLS, Disp: 90 tablet, Rfl: 1   atorvastatin  (LIPITOR) 20 MG tablet, Take 20 mg by mouth daily., Disp: , Rfl:    bisoprolol  (ZEBETA ) 5 MG tablet, Take 1 tablet (5 mg total) by mouth daily. PLEASE SCHEDULE APPOINTMENT FOR MORE REFILLS, Disp: 30 tablet, Rfl: 0   EPINEPHrine  0.3 mg/0.3 mL IJ SOAJ injection, Inject 0.3 mg into the muscle as needed for anaphylaxis., Disp: 1 each, Rfl: 1   famotidine  (PEPCID ) 20 MG tablet, Take 1 tablet (20 mg total) by mouth 2 (two) times daily., Disp: 28 tablet, Rfl: 0   FARXIGA  10 MG TABS tablet, Take 1 tablet (10 mg total) by mouth daily. PLEASE SCHEDULE APPOINTMENT FOR MORE REFILLS, Disp: 30 tablet, Rfl: 0   fluticasone  (FLONASE ) 50 MCG/ACT nasal spray, PLACE 1 SPRAY INTO BOTH NOSTRILS DAILY., Disp: 16 g, Rfl: 11   fluticasone -salmeterol (ADVAIR  DISKUS) 250-50 MCG/ACT AEPB, Inhale 1 puff into the lungs in the morning and at bedtime., Disp: 60 each, Rfl: 11   OZEMPIC, 0.25 OR 0.5 MG/DOSE, 2 MG/3ML SOPN, Inject 0.5 mg into the skin once a week. Wednesday, Disp: ,  Rfl:    potassium chloride  SA (KLOR-CON  M) 20 MEQ tablet, Take 1 tablet (20 mEq total) by mouth daily. PLEASE SCHEDULE APPOINTMENT FOR MORE REFILLS, Disp: 30 tablet, Rfl: 0   spironolactone  (ALDACTONE ) 25 MG tablet, Take 1 tablet (25 mg total) by mouth daily. NEEDS FOLLOW UP APPOINTMENT FOR MORE REFILLS, Disp: 90 tablet, Rfl: 1   Tiotropium Bromide Monohydrate  (SPIRIVA  RESPIMAT) 2.5 MCG/ACT AERS, Inhale 2 puffs into the lungs daily., Disp: 4 g, Rfl: 11   torsemide  (DEMADEX ) 20 MG tablet, TAKE 3 TABLETS (60 MG TOTAL) BY MOUTH DAILY., Disp: 180 tablet, Rfl: 3      Objective:  BP 129/82    Pulse 93   Ht 6' 1 (1.854 m)   Wt (!) 322 lb (146.1 kg)   SpO2 96%   PF (!) 3 L/min   BMI 42.48 kg/m   Wt Readings from Last 3 Encounters:  03/19/24 (!) 322 lb (146.1 kg)  01/30/24 (!) 340 lb (154.2 kg)  09/20/23 (!) 356 lb (161.5 kg)    Physical Exam Constitutional:      General: He is not in acute distress.    Appearance: Normal appearance. He is obese.  Eyes:     General: No scleral icterus.    Conjunctiva/sclera: Conjunctivae normal.  Cardiovascular:     Rate and Rhythm: Normal rate and regular rhythm.  Pulmonary:     Breath sounds: No wheezing, rhonchi or rales.  Musculoskeletal:     Right lower leg: No edema.     Left lower leg: No edema.  Skin:    General: Skin is warm and dry.  Neurological:     General: No focal deficit present.      Diagnostic Review:  Last CBC Lab Results  Component Value Date   WBC 8.0 06/10/2023   HGB 14.7 06/10/2023   HCT 48.9 06/10/2023   MCV 97.8 06/10/2023   MCH 29.4 06/10/2023   RDW 14.7 06/10/2023   PLT 218 06/10/2023   Last metabolic panel Lab Results  Component Value Date   GLUCOSE 126 (H) 06/10/2023   NA 137 06/10/2023   K 4.8 06/10/2023   CL 90 (L) 06/10/2023   CO2 36 (H) 06/10/2023   BUN 24 (H) 06/10/2023   CREATININE 1.65 (H) 06/10/2023   GFRNONAA 46 (L) 06/10/2023   CALCIUM  9.8 06/10/2023   PHOS 3.8 05/07/2022   PROT 6.9 01/10/2023   ALBUMIN  3.3 (L) 01/10/2023   LABGLOB 2.5 10/12/2018   AGRATIO 1.7 10/12/2018   BILITOT 0.3 01/10/2023   ALKPHOS 61 01/10/2023   AST 20 01/10/2023   ALT 20 01/10/2023   ANIONGAP 11 06/10/2023       Assessment & Plan:   Assessment & Plan Chronic obstructive pulmonary disease, unspecified COPD type (HCC)  Orders:   AMB referral to pulmonary rehabilitation   Ambulatory Referral for DME  Chronic hypoxemic respiratory failure (HCC)  Orders:   Ambulatory Referral for DME  OSA (obstructive sleep apnea)  Orders:   Ambulatory Referral for DME   Assessment and  Plan Assessment & Plan Chronic obstructive pulmonary disease (COPD) with chronic respiratory failure with hypoxia - Continue Spiriva  Respimat, two puffs in the morning. - Continue Advair  Diskus, one puff in the morning and one in the evening. - Referred to pulmonary rehabilitation program for exercise and inhaler technique training. - Advised switching to edible forms of marijuana to reduce airway irritation.  Obstructive sleep apnea Mild to moderate obstructive sleep apnea with an apnea-hypopnea index of 14.9 events  per hour and oxygen desaturation to 69%. CPAP therapy recommended to improve oxygenation and reduce apnea events. - Ordered CPAP machine. - Instructed to use CPAP at least four hours per night to maintain insurance coverage. - Will schedule follow-up appointment one month after starting CPAP to review results and adjust settings if needed.  Obesity Significant weight loss of over 30 pounds since May. Continued weight loss is encouraged to improve diaphragm function and reduce respiratory symptoms. - Encouraged continued weight loss efforts. - Referred to pulmonary rehabilitation program for exercise and weight management support.      No follow-ups on file.   Dorn KATHEE Chill, MD

## 2024-03-19 NOTE — Assessment & Plan Note (Addendum)
  Orders:   Ambulatory Referral for DME

## 2024-03-19 NOTE — Telephone Encounter (Signed)
 Called to confirm/remind patient of their appointment at [facility] on 03/20/24.   Appointment:   [] Confirmed  [x] Left mess   [] No answer/No voice mail  [] VM Full/unable to leave message  [] Phone not in service  And to bring in all medications and/or complete list.

## 2024-03-20 ENCOUNTER — Ambulatory Visit (HOSPITAL_COMMUNITY)
Admission: RE | Admit: 2024-03-20 | Discharge: 2024-03-20 | Disposition: A | Discharge status: Home / Self Care | Source: Ambulatory Visit

## 2024-03-23 ENCOUNTER — Telehealth (HOSPITAL_COMMUNITY): Payer: Self-pay

## 2024-03-23 ENCOUNTER — Encounter (HOSPITAL_COMMUNITY): Payer: Self-pay

## 2024-03-23 NOTE — Telephone Encounter (Signed)
 Pt insurance is active and benefits verified through [facility] Medicare Dual Co-pay 0, DED $257/$257 met, out of pocket $9,350/$6,658.04 met, co-insurance 20%. no pre-authorization required, [place]/[employer] 03/23/24@3 :32, REF# [id number]  No visit limit for pulmonary rehab

## 2024-03-23 NOTE — Telephone Encounter (Signed)
 Pt insurance is active and benefits verified through [facility] Medicare Dual Co-pay 0, DED $257/$257 met, out of pocket $9,350/$2,329.56 met, co-insurance 20%. no pre-authorization required, [place]/[employer] 03/23/24@3 :32, REF# [id number]   No visit limit for pulmonary rehab

## 2024-03-27 ENCOUNTER — Encounter: Payer: Self-pay | Admitting: Pulmonary Disease

## 2024-03-30 ENCOUNTER — Telehealth (HOSPITAL_COMMUNITY): Payer: Self-pay

## 2024-03-30 ENCOUNTER — Ambulatory Visit (HOSPITAL_COMMUNITY)

## 2024-03-30 NOTE — Progress Notes (Incomplete)
 ADVANCED HF CLINIC NOTE  Primary Care: McVey, Almarie Folks, PA-C Primary Cardiologist: Dr. Francyne HF Cardiologist: Dr. Rolan  HPI: Zachary Hawkins is a 68 y.o. with a h/o HFpEF, OSA, CPAP, smoker, CKD Stage III, HTN, obesity,and PAF. Has had multiple ED/hospital visits over the last year.  Ongoing compliance issues.   Echo 12/22 EF 60-65%, RV ok.    Admitted 03/2022 with A/C hypoxic/hypercarbic respiratory failure and volume overload. Treated with antibiotics steroids, nebs and diuretics.    Admitted 04/29/22 with increased shortness of breath in the setting of HF exacerbation. Diuresed with IV lasix  and transitioned to lasix  80 mg twice a day   Evaluated in the ED 05/2021 with COPD exacerbation. Treated with nebulizers and steroids.    Admitted 06/23/21 with A/C respiratory failure and heart exacerbation. Placed on Bipap with improvement. Diuresed with IV lasix  and transitioned to torsemide  100 mg daily. D/C weigth 344 pounds. Discharged 06/26/21 .   Seen in [facility] 3/23, volume up, likely in setting of high salt intake. Instructed to take extra torsemide  50 mg daily.   Admitted 07/17/21 with a/c CHF, diuresed with IV lasix . Discharged home, weight 338 lbs.  Post hospital follow up 3/23, NYHA III, volume OK on exam.   Admitted 4/23 with acute on chronic respiratory failure 2/2 CPAP noncompliance and unintentional pain medication overdose. Failed NIV and required intubation. CHF compensated.  Admitted 5/23 with SOB and AKI. GDMT held. Weight down to 325 lbs at discharge, new SCr baseline 1.5. Amlodipine , torsemide  and spiro stopped at discharge. Saw general cardiology at follow up and torsemide  restarted at 60 mg daily and spiro at 25 mg daily.  Admitted 12/24 with A/C respiratory failure and A/C HFpEF. Diuresed with IV lasix  and transitioned to torsemide  80 mg daily.   Echo 11/25 EF 60-65%, Severe LVH, no significant valvular abnormalities, RV not well visualized. IVC not dilated.    03/20/24: He presents today for routine f/u and f/u echo.  Echo done today shows He reports chronic NYHA Class II-early II symptoms, confounded by morbid obesity and deconditioning. Reports compliance w/ med regimen and diuretics. Wt is down 4 lb from previous visit. BP elevated, 140/90. Denies CP. Continues on chronic home O2 and followed by pulmonology. He admits to dietary indiscretion w/ high sodium/ processed foods. Also high sugar diet. Drinks regular soft drinks and fruit juices. Just started Ozempic.   He returns today for heart failure follow up. Overall feeling ***. NYHA ***. Reports {Symptoms; cardiac:12860::dyspnea,fatigue}. Denies {Symptoms; cardiac:12860::chest pain,dyspnea,fatigue,near-syncope,orthopnea,palpitations,dizziness,abnormal bleeding}. Able to perform ADLs. Appetite okay. Weight at home ***. BP at home***. Compliant with all medications.   PMH: 1. CKD stage 3 2. OSA: Uses CPAP.  3. HTN 4. Atrial fibrillation: Paroxysmal.  5. COPD: Prior smoker, quit in early 2023.  - Uses 2 L home oxygen.  6. Erectile dysfunction 7. Chronic diastolic CHF: Echo (12/22) with EF 60-65%, normal RV, normal IVC.  8. Hyperlipidemia  ROS: All systems reviewed and negative except as per HPI.   Current Outpatient Medications  Medication Sig Dispense Refill   albuterol  (PROVENTIL ) (2.5 MG/3ML) 0.083% nebulizer solution Take 3 mLs (2.5 mg total) by nebulization every 6 (six) hours as needed for wheezing or shortness of breath. 75 mL 5   albuterol  (VENTOLIN  HFA) 108 (90 Base) MCG/ACT inhaler Inhale 2 puffs into the lungs every 4 (four) hours as needed for wheezing or shortness of breath. 18 g 6   amLODipine  (NORVASC ) 2.5 MG tablet Take 1 tablet (2.5 mg total) by  mouth daily. 90 tablet 3   amoxicillin  (AMOXIL ) 875 MG tablet Take 875 mg by mouth 2 (two) times daily.     apixaban  (ELIQUIS ) 5 MG TABS tablet Take 1 tablet (5 mg total) by mouth 2 (two) times daily. PLEASE  SCHEDULE APPOINTMENT FOR MORE REFILLS 60 tablet 0   atorvastatin  (LIPITOR) 10 MG tablet Take 1 tablet (10 mg total) by mouth daily. NEEDS FOLLOW UP APPOINTMENT FOR MORE REFILLS 90 tablet 1   atorvastatin  (LIPITOR) 20 MG tablet Take 20 mg by mouth daily.     bisoprolol  (ZEBETA ) 5 MG tablet Take 1 tablet (5 mg total) by mouth daily. PLEASE SCHEDULE APPOINTMENT FOR MORE REFILLS 30 tablet 0   EPINEPHrine  0.3 mg/0.3 mL IJ SOAJ injection Inject 0.3 mg into the muscle as needed for anaphylaxis. 1 each 1   famotidine  (PEPCID ) 20 MG tablet Take 1 tablet (20 mg total) by mouth 2 (two) times daily. 28 tablet 0   FARXIGA  10 MG TABS tablet Take 1 tablet (10 mg total) by mouth daily. PLEASE SCHEDULE APPOINTMENT FOR MORE REFILLS 30 tablet 0   fluticasone  (FLONASE ) 50 MCG/ACT nasal spray PLACE 1 SPRAY INTO BOTH NOSTRILS DAILY. 16 g 11   fluticasone -salmeterol (ADVAIR  DISKUS) 250-50 MCG/ACT AEPB Inhale 1 puff into the lungs in the morning and at bedtime. 60 each 11   OZEMPIC, 0.25 OR 0.5 MG/DOSE, 2 MG/3ML SOPN Inject 0.5 mg into the skin once a week. Wednesday     potassium chloride  SA (KLOR-CON  M) 20 MEQ tablet Take 1 tablet (20 mEq total) by mouth daily. PLEASE SCHEDULE APPOINTMENT FOR MORE REFILLS 30 tablet 0   spironolactone  (ALDACTONE ) 25 MG tablet Take 1 tablet (25 mg total) by mouth daily. NEEDS FOLLOW UP APPOINTMENT FOR MORE REFILLS 90 tablet 1   Tiotropium Bromide Monohydrate  (SPIRIVA  RESPIMAT) 2.5 MCG/ACT AERS Inhale 2 puffs into the lungs daily. 4 g 11   torsemide  (DEMADEX ) 20 MG tablet TAKE 3 TABLETS (60 MG TOTAL) BY MOUTH DAILY. 180 tablet 3   No current facility-administered medications for this visit.   Allergies  Allergen Reactions   Ace Inhibitors Anaphylaxis   Black Cherry Fruit Extract Carolee Extract] Anaphylaxis    Tongue   Tour Manager (Non-Screening)] Anaphylaxis   Social History   Socioeconomic History   Marital status: Single    Spouse name: Not on file    Number of children: Not on file   Years of education: Not on file   Highest education level: Not on file  Occupational History   Not on file  Tobacco Use   Smoking status: Former    Current packs/day: 0.00    Average packs/day: 1 pack/day for 36.0 years (36.0 ttl pk-yrs)    Types: Cigarettes    Start date: 01/08/1985    Quit date: 01/08/2021    Years since quitting: 3.2   Smokeless tobacco: Never  Vaping Use   Vaping status: Never Used  Substance and Sexual Activity   Alcohol use: Yes    Alcohol/week: 1.0 standard drink of alcohol    Types: 1 Shots of liquor per week    Comment: 1-2 glasses a week   Drug use: Not Currently    Types: Marijuana   Sexual activity: Not on file  Other Topics Concern   Not on file  Social History Narrative   Not on file   Social Drivers of Health   Financial Resource Strain: High Risk (07/15/2021)   Overall Financial Resource Strain (CARDIA)  Difficulty of Paying Living Expenses: Very hard  Food Insecurity: Low Risk  (02/21/2024)   Received from [facility]   Hunger Vital Sign    Within the past 12 months, you worried that your food would run out before you got money to buy more: Never true    Within the past 12 months, the food you bought just didn't last and you didn't have money to get more. : Never true  Transportation Needs: No Transportation Needs (02/21/2024)   Received from [facility]    In the past 12 months, has lack of reliable transportation kept you from medical appointments, meetings, work or from getting things needed for daily living? : No  Physical Activity: Not on file  Stress: Not on file  Social Connections: Unknown (09/22/2021)   [facility]   Social Network    Social Network: Not on file  Intimate Partner Violence: Unknown (08/14/2021)   Received from [facility]   HITS    Physically Hurt: Not on file    Insult or Talk Down To: Not on file    Threaten Physical Harm: Not on file     Scream or Curse: Not on file   Family History  Problem Relation Age of Onset   Diabetes Mother    Diabetes Father    There were no vitals taken for this visit.  Wt Readings from Last 3 Encounters:  03/19/24 (!) 146.1 kg (322 lb)  01/30/24 (!) 154.2 kg (340 lb)  09/20/23 (!) 161.5 kg (356 lb)   PHYSICAL EXAM: General: *** appearing. No distress  Cardiac: JVP ~*** cm. No murmurs  Resp: Lung sounds clear and equal B/L Abdomen: Soft, non-distended.  Extremities: Warm and dry.  *** edema.  Neuro: A&O x3. Affect pleasant.   ASSESSMENT & PLAN: 1. Chronic diastolic heart failure: Echo in 12/22 with EF 60-65%, RV normal. Multiple admissions for CHF in 2023. Echo today EF 60-65%, Severe LVH, no significant valvular abnormalities, RV not well visualized. IVC not dilated.  - NYHA II-early III, counfounded by super morbid obesity, deconditioning, COPD and suspected OHS - Wt down 4lb from prior visit. Volume assessment difficult given body habitus/ obesity but suspect euvolemic based on IVC measurement on today's echo  - continue torsemide  80 mg daily. Check BNP and BMP today  - Increase bisoprolol  to 5 mg daily.  - Angioedema with ACEI so not a candidate for ARNI.  - Continue Farxiga  10 mg daily. - Continue spironolactone  25 mg daily. - add amlodipine  2.5 mg daily to help w/ BP control (doubt he would comply w/ tid hydralazine )  - ? Cardiomems, but he has Medicaid => he would be interested, may be able to get this when he goes on Medicare. Also may not be a suitable candidate given chest circumference. Reconsider if wt loss    2. Atrial fibrillation: Paroxysmal. RRR on exam today  - Continue bisoprolol .  - Continue Eliquis  5 mg bid. No bleeding issues. Check CBC today   3. OSA: Reinforced compliance w/ Bipap.   4. COPD: Prior smoker.  Uses home oxygen.  - followed by pulmonology, Dr. Gladis   5. Obesity: There is no height or weight on file to calculate BMI. - He is now on Ozempic 0.25  mg daily, just started. - discussed smaller food portions and reduction of sugar intake (elimination of soft drinks and fruit juices)  6. CKD Stage IIIa: Baseline around 1.3-1.6.  - Continue SGLT2i. denies GU symptoms -  check BMP today   7. Hyperlipidemia: Good lipids 9/23.  8. Hypertension: moderately elevated. Echo shows LVH  - increase bisoprolol  to 5 mg daily  - add amlodipine  2.5 mg daily  - f/u w/ pharmD in 2 wks to recheck BP and further adjust meds  F/u w/ Pharm D in 2 wks and APP in 4-6 wks   Follow up in *** with ***  Shomari Matusik, NP 03/30/2024

## 2024-03-30 NOTE — Telephone Encounter (Signed)
 Called to confirm/remind patient of their appointment at the [facility] on 03/30/24 2:30.   Appointment:   [] Confirmed  [x] Left mess   [] No answer/No voice mail  [] VM Full/unable to leave message  [] Phone not in service  Patient reminded to bring all medications and/or complete list.  Confirmed patient has transportation. Gave directions, instructed to utilize valet parking.

## 2024-04-02 ENCOUNTER — Other Ambulatory Visit (HOSPITAL_COMMUNITY): Payer: Self-pay | Admitting: Cardiology

## 2024-04-09 ENCOUNTER — Telehealth (HOSPITAL_COMMUNITY): Payer: Self-pay

## 2024-04-09 NOTE — Telephone Encounter (Signed)
 Called to confirm/remind patient of their appointment at the [facility] on 04/09/2024.   Appointment:   [x] Confirmed  [] Left mess   [] No answer/No voice mail  [] VM Full/unable to leave message  [] Phone not in service  Patient reminded to bring all medications and/or complete list.  Confirmed patient has transportation. Gave directions, instructed to utilize valet parking.

## 2024-04-10 ENCOUNTER — Ambulatory Visit (HOSPITAL_COMMUNITY)
Admission: RE | Admit: 2024-04-10 | Discharge: 2024-04-10 | Disposition: A | Discharge status: Home / Self Care | Source: Ambulatory Visit | Attending: Internal Medicine | Admitting: Internal Medicine

## 2024-04-10 ENCOUNTER — Encounter (HOSPITAL_COMMUNITY): Payer: Self-pay

## 2024-04-10 ENCOUNTER — Ambulatory Visit (HOSPITAL_COMMUNITY): Payer: Self-pay | Admitting: Internal Medicine

## 2024-04-10 VITALS — BP 120/80 | HR 100 | Wt 321.6 lb

## 2024-04-10 DIAGNOSIS — E66813 Obesity, class 3: Secondary | ICD-10-CM

## 2024-04-10 DIAGNOSIS — Z7985 Long-term (current) use of injectable non-insulin antidiabetic drugs: Secondary | ICD-10-CM | POA: Insufficient documentation

## 2024-04-10 DIAGNOSIS — Z87891 Personal history of nicotine dependence: Secondary | ICD-10-CM | POA: Insufficient documentation

## 2024-04-10 DIAGNOSIS — I480 Paroxysmal atrial fibrillation: Secondary | ICD-10-CM | POA: Diagnosis not present

## 2024-04-10 DIAGNOSIS — F1010 Alcohol abuse, uncomplicated: Secondary | ICD-10-CM

## 2024-04-10 DIAGNOSIS — I130 Hypertensive heart and chronic kidney disease with heart failure and stage 1 through stage 4 chronic kidney disease, or unspecified chronic kidney disease: Secondary | ICD-10-CM | POA: Insufficient documentation

## 2024-04-10 DIAGNOSIS — I5032 Chronic diastolic (congestive) heart failure: Secondary | ICD-10-CM | POA: Insufficient documentation

## 2024-04-10 DIAGNOSIS — Z79899 Other long term (current) drug therapy: Secondary | ICD-10-CM | POA: Insufficient documentation

## 2024-04-10 DIAGNOSIS — G4733 Obstructive sleep apnea (adult) (pediatric): Secondary | ICD-10-CM | POA: Diagnosis not present

## 2024-04-10 DIAGNOSIS — Z7901 Long term (current) use of anticoagulants: Secondary | ICD-10-CM | POA: Insufficient documentation

## 2024-04-10 DIAGNOSIS — E785 Hyperlipidemia, unspecified: Secondary | ICD-10-CM | POA: Insufficient documentation

## 2024-04-10 DIAGNOSIS — J441 Chronic obstructive pulmonary disease with (acute) exacerbation: Secondary | ICD-10-CM | POA: Diagnosis not present

## 2024-04-10 DIAGNOSIS — J449 Chronic obstructive pulmonary disease, unspecified: Secondary | ICD-10-CM | POA: Insufficient documentation

## 2024-04-10 DIAGNOSIS — Z91199 Patient's noncompliance with other medical treatment and regimen due to unspecified reason: Secondary | ICD-10-CM | POA: Insufficient documentation

## 2024-04-10 DIAGNOSIS — F1290 Cannabis use, unspecified, uncomplicated: Secondary | ICD-10-CM | POA: Insufficient documentation

## 2024-04-10 DIAGNOSIS — Z6841 Body Mass Index (BMI) 40.0 and over, adult: Secondary | ICD-10-CM | POA: Insufficient documentation

## 2024-04-10 DIAGNOSIS — Z7984 Long term (current) use of oral hypoglycemic drugs: Secondary | ICD-10-CM | POA: Insufficient documentation

## 2024-04-10 DIAGNOSIS — N1831 Chronic kidney disease, stage 3a: Secondary | ICD-10-CM | POA: Insufficient documentation

## 2024-04-10 DIAGNOSIS — I10 Essential (primary) hypertension: Secondary | ICD-10-CM

## 2024-04-10 LAB — BASIC METABOLIC PANEL WITH GFR
Anion gap: 11 (ref 5–15)
BUN: 25 mg/dL — ABNORMAL HIGH (ref 8–23)
CO2: 30 mmol/L (ref 22–32)
Calcium: 9.4 mg/dL (ref 8.9–10.3)
Chloride: 97 mmol/L — ABNORMAL LOW (ref 98–111)
Creatinine, Ser: 1.8 mg/dL — ABNORMAL HIGH (ref 0.61–1.24)
GFR, Estimated: 41 mL/min — ABNORMAL LOW (ref >60)
Glucose, Bld: 117 mg/dL — ABNORMAL HIGH (ref 70–99)
Potassium: 4.3 mmol/L (ref 3.5–5.1)
Sodium: 138 mmol/L (ref 135–145)

## 2024-04-10 LAB — CBC
HCT: 45.4 % (ref 39.0–52.0)
Hemoglobin: 14.8 g/dL (ref 13.0–17.0)
MCH: 30 pg (ref 26.0–34.0)
MCHC: 32.6 g/dL (ref 30.0–36.0)
MCV: 92.1 fL (ref 80.0–100.0)
Platelets: 276 K/uL (ref 150–400)
RBC: 4.93 MIL/uL (ref 4.22–5.81)
RDW: 14.2 % (ref 11.5–15.5)
WBC: 8.4 K/uL (ref 4.0–10.5)
nRBC: 0 % (ref 0.0–0.2)

## 2024-04-10 LAB — BRAIN NATRIURETIC PEPTIDE: B Natriuretic Peptide: 10.5 pg/mL (ref 0.0–100.0)

## 2024-04-10 NOTE — Patient Instructions (Addendum)
 Good to see you today!   Labs done today, your results will be available in MyChart, we will contact you for abnormal readings.  Your physician has requested that you have an echocardiogram. Echocardiography is a painless test that uses sound waves to create images of your heart. It provides your doctor with information about the size and shape of your heart and how well your heart's chambers and valves are working. This procedure takes approximately one hour. There are no restrictions for this procedure. Please do NOT wear cologne, perfume, aftershave, or lotions (deodorant is allowed). Please arrive 15 minutes prior to your appointment time.  Please note: We ask at that you not bring children with you during ultrasound (echo/ vascular) testing. Due to room size and safety concerns, children are not allowed in the ultrasound rooms during exams. Our front office staff cannot provide observation of children in our lobby area while testing is being conducted. An adult accompanying a patient to their appointment will only be allowed in the ultrasound room at the discretion of the ultrasound technician under special circumstances. We apologize for any inconvenience.  Your physician recommends that you schedule a follow-up appointment as scheduled   If you have any questions or concerns before your next appointment please send us  a message through [place] or call our office at [phone].    TO LEAVE A MESSAGE FOR THE NURSE SELECT OPTION 2, PLEASE LEAVE A MESSAGE INCLUDING: YOUR NAME DATE OF BIRTH CALL BACK NUMBER REASON FOR CALL**this is important as we prioritize the call backs  YOU WILL RECEIVE A CALL BACK THE SAME DAY AS LONG AS YOU CALL BEFORE 4:00 PM At the [facility], you and your health needs are our priority. As part of our continuing mission to provide you with exceptional heart care, we have created designated Provider Care Teams. These Care Teams include your primary  Cardiologist (physician) and Advanced Practice Providers (APPs- Physician Assistants and Nurse Practitioners) who all work together to provide you with the care you need, when you need it.   You may see any of the following providers on your designated Care Team at your next follow up: Dr Toribio Fuel Dr Ezra Shuck Dr. Morene Brownie Greig Mosses, NP Caffie Shed, [place] [facility] [place], [place] Beckey Coe, NP Jordan Lee, NP Ellouise Class, NP Tinnie Redman, PharmD Jaun Bash, PharmD   Please be sure to bring in all your medications bottles to every appointment.    Thank you for choosing [facility]

## 2024-04-10 NOTE — Progress Notes (Signed)
 ADVANCED HF CLINIC NOTE  Primary Care: McVey, Almarie Folks, PA-C Primary Cardiologist: Dr. Francyne HF Cardiologist: Dr. Rolan  HPI: [name] is a 68 y.o. with a h/o HFpEF, OSA, CPAP, smoker, CKD Stage III, HTN, obesity,and PAF. Has had multiple ED/hospital visits over the last year.  Ongoing compliance issues.   Echo 12/22 EF 60-65%, RV ok.    Admitted 03/2022 with A/C hypoxic/hypercarbic respiratory failure and volume overload. Treated with antibiotics steroids, nebs and diuretics.    Admitted 04/29/22 with increased shortness of breath in the setting of HF exacerbation. Diuresed with IV lasix  and transitioned to lasix  80 mg twice a day   Evaluated in the ED 05/2021 with COPD exacerbation. Treated with nebulizers and steroids.    Admitted 06/23/21 with A/C respiratory failure and heart exacerbation. Placed on Bipap with improvement. Diuresed with IV lasix  and transitioned to torsemide  100 mg daily. D/C weigth 344 pounds. Discharged 06/26/21 .   Seen in [facility] 3/23, volume up, likely in setting of high salt intake. Instructed to take extra torsemide  50 mg daily.   Admitted 07/17/21 with a/c CHF, diuresed with IV lasix . Discharged home, weight 338 lbs.  Post hospital follow up 3/23, NYHA III, volume OK on exam.   Admitted 4/23 with acute on chronic respiratory failure 2/2 CPAP noncompliance and unintentional pain medication overdose. Failed NIV and required intubation. CHF compensated.  Admitted 5/23 with SOB and AKI. GDMT held. Weight down to 325 lbs at discharge, new SCr baseline 1.5. Amlodipine , torsemide  and spiro stopped at discharge. Saw general cardiology at follow up and torsemide  restarted at 60 mg daily and spiro at 25 mg daily.  Admitted 12/24 with A/C respiratory failure and A/C HFpEF. Diuresed with IV lasix  and transitioned to torsemide  80 mg daily.   Echo 11/25 EF 60-65%, Severe LVH, no significant valvular abnormalities, RV not well visualized. IVC not dilated.    Today he returns for AHF follow up. Overall feeling ok. Denies palpitations, CP, dizziness, edema, or PND/Orthopnea. SOB with activity.  Appetite ok, has been eating less with ozempic. Weight at home 330 pounds. Taking all medications but has only been taking 40 mg Torsemide  daily. Denies tobacco use. Drinks pinot grigio/wine every 2-3 days with dinner, drinks to get tipsy not drunk. Smokes marijuana every other day. States recent joint was possibly laced with cocaine. Starting pulmonary rehab soon.   PMH: 1. CKD stage 3 2. OSA: Uses CPAP.  3. HTN 4. Atrial fibrillation: Paroxysmal.  5. COPD: Prior smoker, quit in early 2023.  - Uses 2 L home oxygen.  6. Erectile dysfunction 7. Chronic diastolic CHF: Echo (12/22) with EF 60-65%, normal RV, normal IVC.  8. Hyperlipidemia  ROS: All systems reviewed and negative except as per HPI.   Current Outpatient Medications  Medication Sig Dispense Refill   albuterol  (PROVENTIL ) (2.5 MG/3ML) 0.083% nebulizer solution Take 3 mLs (2.5 mg total) by nebulization every 6 (six) hours as needed for wheezing or shortness of breath. 75 mL 5   albuterol  (VENTOLIN  HFA) 108 (90 Base) MCG/ACT inhaler Inhale 2 puffs into the lungs every 4 (four) hours as needed for wheezing or shortness of breath. 18 g 6   amLODipine  (NORVASC ) 2.5 MG tablet Take 1 tablet (2.5 mg total) by mouth daily. 90 tablet 3   apixaban  (ELIQUIS ) 5 MG TABS tablet Take 1 tablet (5 mg total) by mouth 2 (two) times daily. PLEASE SCHEDULE APPOINTMENT FOR MORE REFILLS [phone] OPTION 2 60 tablet 0   atorvastatin  (LIPITOR) 10  MG tablet Take 1 tablet (10 mg total) by mouth daily. NEEDS FOLLOW UP APPOINTMENT FOR MORE REFILLS 90 tablet 1   atorvastatin  (LIPITOR) 20 MG tablet Take 20 mg by mouth daily.     bisoprolol  (ZEBETA ) 5 MG tablet Take 1 tablet (5 mg total) by mouth daily. PLEASE SCHEDULE APPOINTMENT FOR MORE REFILLS [phone] OPTION 2 30 tablet 0   EPINEPHrine  0.3 mg/0.3 mL IJ SOAJ  injection Inject 0.3 mg into the muscle as needed for anaphylaxis. 1 each 1   FARXIGA  10 MG TABS tablet Take 1 tablet (10 mg total) by mouth daily. PLEASE SCHEDULE APPOINTMENT FOR MORE REFILLS [phone] OPTION 2 30 tablet 0   fluticasone  (FLONASE ) 50 MCG/ACT nasal spray PLACE 1 SPRAY INTO BOTH NOSTRILS DAILY. 16 g 11   fluticasone -salmeterol (ADVAIR  DISKUS) 250-50 MCG/ACT AEPB Inhale 1 puff into the lungs in the morning and at bedtime. 60 each 11   OZEMPIC, 0.25 OR 0.5 MG/DOSE, 2 MG/3ML SOPN Inject 0.5 mg into the skin once a week. Wednesday     potassium chloride  SA (KLOR-CON  M) 20 MEQ tablet Take 1 tablet (20 mEq total) by mouth daily. PLEASE SCHEDULE APPOINTMENT FOR MORE REFILLS 30 tablet 0   spironolactone  (ALDACTONE ) 25 MG tablet Take 1 tablet (25 mg total) by mouth daily. NEEDS FOLLOW UP APPOINTMENT FOR MORE REFILLS 90 tablet 1   Tiotropium Bromide Monohydrate  (SPIRIVA  RESPIMAT) 2.5 MCG/ACT AERS Inhale 2 puffs into the lungs daily. 4 g 11   torsemide  (DEMADEX ) 20 MG tablet Take 40 mg by mouth daily.     amoxicillin  (AMOXIL ) 875 MG tablet Take 875 mg by mouth 2 (two) times daily.     famotidine  (PEPCID ) 20 MG tablet Take 1 tablet (20 mg total) by mouth 2 (two) times daily. (Patient not taking: Reported on 04/10/2024) 28 tablet 0   No current facility-administered medications for this encounter.   Allergies  Allergen Reactions   Ace Inhibitors Anaphylaxis   Black Cherry Fruit Extract Zachary Extract] Anaphylaxis    Tongue   Tour Manager (Non-Screening)] Anaphylaxis   Social History   Socioeconomic History   Marital status: Single    Spouse name: Not on file   Number of children: Not on file   Years of education: Not on file   Highest education level: Not on file  Occupational History   Not on file  Tobacco Use   Smoking status: Former    Current packs/day: 0.00    Average packs/day: 1 pack/day for 36.0 years (36.0 ttl pk-yrs)    Types: Cigarettes     Start date: 01/08/1985    Quit date: 01/08/2021    Years since quitting: 3.2   Smokeless tobacco: Never  Vaping Use   Vaping status: Never Used  Substance and Sexual Activity   Alcohol use: Yes    Alcohol/week: 1.0 standard drink of alcohol    Types: 1 Shots of liquor per week    Comment: 1-2 glasses a week   Drug use: Not Currently    Types: Marijuana   Sexual activity: Not on file  Other Topics Concern   Not on file  Social History Narrative   Not on file   Social Drivers of Health   Financial Resource Strain: High Risk (07/15/2021)   Overall Financial Resource Strain (CARDIA)    Difficulty of Paying Living Expenses: Very hard  Food Insecurity: Low Risk  (02/21/2024)   Received from [facility]   Hunger Vital Sign    Within the  past 12 months, you worried that your food would run out before you got money to buy more: Never true    Within the past 12 months, the food you bought just didn't last and you didn't have money to get more. : Never true  Transportation Needs: No Transportation Needs (02/21/2024)   Received from [facility]    In the past 12 months, has lack of reliable transportation kept you from medical appointments, meetings, work or from getting things needed for daily living? : No  Physical Activity: Not on file  Stress: Not on file  Social Connections: Unknown (09/22/2021)   Received from [facility]   Social Network    Social Network: Not on file  Intimate Partner Violence: Unknown (08/14/2021)   Received from [facility]   HITS    Physically Hurt: Not on file    Insult or Talk Down To: Not on file    Threaten Physical Harm: Not on file    Scream or Curse: Not on file   Family History  Problem Relation Age of Onset   Diabetes Mother    Diabetes Father    BP 120/80   Pulse 100   Wt (!) 145.9 kg (321 lb 9.6 oz)   SpO2 95% Comment: 3-liters of room oxygen  BMI 42.43 kg/m   Wt Readings from Last 3 Encounters:  04/10/24 (!) 145.9  kg (321 lb 9.6 oz)  03/19/24 (!) 146.1 kg (322 lb)  01/30/24 (!) 154.2 kg (340 lb)   PHYSICAL EXAM: General:  chronically ill appearing.  No respiratory difficulty Neck: JVD difficult to see, does not appear elevated.   Cor: Regular rate & rhythm. No murmurs. Lungs: clear, diminished bases Extremities: no edema  Neuro: alert & oriented x 3. Affect pleasant.   ASSESSMENT & PLAN: 1. Chronic diastolic heart failure: Echo in 12/22 with EF 60-65%, RV normal. Multiple admissions for CHF in 2023. Echo today EF 60-65%, Severe LVH, no significant valvular abnormalities, RV not well visualized. IVC not dilated.  - NYHA II-early III, counfounded by super morbid obesity, deconditioning, COPD and suspected OHS.  - Volume does not appear elevated and weight stable. Continue torsemide  40 mg daily. Check BNP/BMP today  - Continue bisoprolol  to 5 mg daily.  - Angioedema with ACEI so not a candidate for ARNI.  - Continue Farxiga  10 mg daily. - Continue spironolactone  25 mg daily. - Continue amlodipine  2.5 mg daily to help w/ BP control (doubt he would comply w/ tid hydralazine )  - ? Cardiomems, but he has Medicaid => he would be interested, may be able to get this when he goes on Medicare. Also may not be a suitable candidate given chest circumference. Reconsider if wt loss   - Update echo at follow up.   2. Atrial fibrillation: Paroxysmal. RRR on exam today  - Continue bisoprolol .  - Continue Eliquis  5 mg bid. Reports blood in his stool over a month ago so he scheduled a visit with PCP but it went away so he never went. Check CBC today   3. OSA: Does not use Bipap at home.   4. COPD: Prior smoker.  Uses home oxygen.  - followed by pulmonology, Dr. Gladis   5. Obesity: Body mass index is 42.43 kg/m. - He is now on Ozempic 0.5 - Discussed smaller food portions and reduction of sugar intake (elimination of soft drinks and fruit juices)  6. CKD Stage IIIa: Baseline around 1.3-1.6.  - Continue  SGLT2i.  Denies GU symptoms - check BMP today   7. Hyperlipidemia: Good lipids 9/23.  8. Hypertension: moderately elevated. Echo shows LVH  - Continue bisoprolol  5 mg daily  - Continue amlodipine  2.5 mg daily   9. ETOH/Marijuana use - cessation advised  Follow up in 2 months with Dr. Rolan + echo.   Beckey LITTIE Coe, NP 04/10/2024

## 2024-04-13 ENCOUNTER — Telehealth (HOSPITAL_COMMUNITY): Payer: Self-pay

## 2024-04-13 NOTE — Telephone Encounter (Signed)
 Called to confirm appt. Pt confirmed appt. Instructed pt on proper footwear. Gave directions along with department number.

## 2024-04-16 ENCOUNTER — Encounter (HOSPITAL_COMMUNITY): Admission: RE | Admit: 2024-04-16 | Source: Ambulatory Visit

## 2024-04-16 ENCOUNTER — Encounter (HOSPITAL_COMMUNITY): Payer: Self-pay

## 2024-04-16 ENCOUNTER — Telehealth (HOSPITAL_COMMUNITY): Payer: Self-pay

## 2024-04-16 NOTE — Telephone Encounter (Signed)
 Patient called to reschedule pulmonary rehab today, will come in on 12/17 at 1:00 now.

## 2024-04-24 ENCOUNTER — Encounter (HOSPITAL_COMMUNITY)

## 2024-04-24 ENCOUNTER — Telehealth (HOSPITAL_COMMUNITY): Payer: Self-pay

## 2024-04-24 NOTE — Telephone Encounter (Signed)
 Called to confirm appt. Pt confirmed appt. Instructed pt on proper footwear. Gave directions along with department number.

## 2024-04-25 ENCOUNTER — Encounter (HOSPITAL_COMMUNITY): Payer: Self-pay

## 2024-04-25 ENCOUNTER — Encounter (HOSPITAL_COMMUNITY): Admission: RE | Admit: 2024-04-25 | Discharge: 2024-04-25 | Attending: Pulmonary Disease

## 2024-04-25 VITALS — BP 120/80 | HR 85 | Wt 317.7 lb

## 2024-04-25 DIAGNOSIS — J9610 Chronic respiratory failure, unspecified whether with hypoxia or hypercapnia: Secondary | ICD-10-CM | POA: Diagnosis present

## 2024-04-25 NOTE — Progress Notes (Signed)
 Pulmonary Individual Treatment Plan  Patient Details  Name: Zachary Hawkins MRN: [id number] Date of Birth: [birth year] Referring Provider:   Conrad Ports Pulmonary Rehab Walk Test from 04/25/2024 in [facility]  Referring Provider Dr. Kara    Initial Encounter Date:  Flowsheet Row Pulmonary Rehab Walk Test from 04/25/2024 in [facility]  Date 04/25/24    Visit Diagnosis: Chronic respiratory failure, unspecified whether with hypoxia or hypercapnia (HCC)  Patient's Home Medications on Admission:  Current Medications[1]  Past Medical History: Past Medical History:  Diagnosis Date   Asthma    BPH (benign prostatic hyperplasia)    Cardiomyopathy (HCC) 06/2017   EF 40-45%   CHF (congestive heart failure) (HCC)    Chronic renal insufficiency, stage 3 (moderate)    COPD (chronic obstructive pulmonary disease) (HCC)    Diabetes mellitus without complication (HCC)    Diastolic dysfunction 06/2017   grade 2    Hyperlipidemia    Hypertension    Hypertensive urgency    Obesity    Obesity    OSA (obstructive sleep apnea)    Oxygen dependent    Paroxysmal atrial fibrillation (HCC) 2022   on Eliquis     Tobacco Use: Tobacco Use History[2]  Labs: Review Flowsheet  More data exists      Latest Ref Rng & Units 08/15/2021 02/02/2022 05/04/2022 05/05/2022 06/10/2023  Labs for ITP Cardiac and Pulmonary Rehab  Cholestrol 0 - 200 mg/dL - 857  - - -  LDL (calc) 0 - 99 mg/dL - 52  - - -  HDL-C >59 mg/dL - 47  - - -  Trlycerides <150 mg/dL 883  786  - 817  -  Hemoglobin A1c 4.8 - 5.6 % - - 7.2  - 6.5   PH, Arterial 7.35 - 7.45 7.434  7.416  - 7.474  - -  PCO2 arterial 32 - 48 mmHg 62.8  63.1  - 50.0  - -  Bicarbonate 20.0 - 28.0 mmol/L 42.4  40.6  - 36.7  - -  TCO2 22 - 32 mmol/L 44  43  - 38  - -  O2 Saturation % 92  99  - 99  - -    Details       Multiple values  from one day are sorted in reverse-chronological order         Capillary Blood Glucose: Lab Results  Component Value Date   GLUCAP 129 (H) 06/16/2023   GLUCAP 99 06/16/2023   GLUCAP 102 (H) 06/16/2023   GLUCAP 209 (H) 06/10/2023   GLUCAP 129 (H) 05/05/2022     Pulmonary Assessment Scores:  Pulmonary Assessment Scores     Row Name 04/25/24 1421         ADL UCSD   ADL Phase Entry     SOB Score total 22       CAT Score   CAT Score 15       mMRC Score   mMRC Score 3       UCSD: Self-administered rating of dyspnea associated with activities of daily living (ADLs) 6-point scale (0 = not at all to 5 = maximal or unable to do because of breathlessness)  Scoring Scores range from 0 to 120.  Minimally important difference is 5 units  CAT: CAT can identify the health impairment of COPD patients and is better correlated with disease progression.  CAT has  a scoring range of zero to 40. The CAT score is classified into four groups of low (less than 10), medium (10 - 20), high (21-30) and very high (31-40) based on the impact level of disease on health status. A CAT score over 10 suggests significant symptoms.  A worsening CAT score could be explained by an exacerbation, poor medication adherence, poor inhaler technique, or progression of COPD or comorbid conditions.  CAT MCID is 2 points  mMRC: mMRC (Modified Medical Research Council) Dyspnea Scale is used to assess the degree of baseline functional disability in patients of respiratory disease due to dyspnea. No minimal important difference is established. A decrease in score of 1 point or greater is considered a positive change.   Pulmonary Function Assessment:  Pulmonary Function Assessment - 04/25/24 1340       Breath   Bilateral Breath Sounds Decreased    Shortness of Breath Yes;Limiting activity          Exercise Target Goals: Exercise Program Goal: Individual exercise prescription set using results from  initial 6 min walk test and THRR while considering  patients activity barriers and safety.   Exercise Prescription Goal: Initial exercise prescription builds to 30-45 minutes a day of aerobic activity, 2-3 days per week.  Home exercise guidelines will be given to patient during program as part of exercise prescription that the participant will acknowledge.  Activity Barriers & Risk Stratification:  Activity Barriers & Cardiac Risk Stratification - 04/25/24 1334       Activity Barriers & Cardiac Risk Stratification   Activity Barriers Deconditioning;Muscular Weakness;Shortness of Breath;History of Falls    Cardiac Risk Stratification Moderate          6 Minute Walk:  6 Minute Walk     Row Name 04/25/24 1424         6 Minute Walk   Phase Initial     Distance 1200 feet     Walk Time 6 minutes     # of Rest Breaks 0     MPH 2.27     METS 2.33     RPE 7     Perceived Dyspnea  1     VO2 Peak 8.14     Symptoms No     Resting HR 95 bpm     Resting BP 120/80     Resting Oxygen Saturation  97 %     Exercise Oxygen Saturation  during 6 min walk 93 %  on 3L [place]     Max Ex. HR 104 bpm     Max Ex. BP 162/90     2 Minute Post BP 120/88       Interval HR   1 Minute HR 95     2 Minute HR 98     3 Minute HR 101     4 Minute HR 103     5 Minute HR 104     6 Minute HR 104     2 Minute Post HR 95     Interval Heart Rate? Yes       Interval Oxygen   Interval Oxygen? Yes     Baseline Oxygen Saturation % 97 %     1 Minute Oxygen Saturation % 95 %     1 Minute Liters of Oxygen 3 L     2 Minute Oxygen Saturation % 93 %     2 Minute Liters of Oxygen 3 L     3 Minute Oxygen Saturation %  94 %     3 Minute Liters of Oxygen 3 L     4 Minute Oxygen Saturation % 95 %     4 Minute Liters of Oxygen 3 L     5 Minute Oxygen Saturation % 95 %     5 Minute Liters of Oxygen 3 L     6 Minute Oxygen Saturation % 95 %     6 Minute Liters of Oxygen 3 L     2 Minute Post Oxygen Saturation % 97  %     2 Minute Post Liters of Oxygen 3 L        Oxygen Initial Assessment:  Oxygen Initial Assessment - 04/25/24 1339       Home Oxygen   Home Oxygen Device Portable Concentrator;Home Concentrator;E-Tanks    Sleep Oxygen Prescription Continuous    Liters per minute 3    Home Exercise Oxygen Prescription Continuous    Liters per minute 3    Home Resting Oxygen Prescription Continuous    Liters per minute 3    Compliance with Home Oxygen Use Yes      Initial 6 min Walk   Oxygen Used Continuous    Liters per minute 3      Program Oxygen Prescription   Program Oxygen Prescription Continuous    Liters per minute 3      Intervention   Short Term Goals To learn and exhibit compliance with exercise, home and travel O2 prescription;To learn and understand importance of monitoring SPO2 with pulse oximeter and demonstrate accurate use of the pulse oximeter.;To learn and understand importance of maintaining oxygen saturations>88%;To learn and demonstrate proper pursed lip breathing techniques or other breathing techniques. ;To learn and demonstrate proper use of respiratory medications    Long  Term Goals Exhibits compliance with exercise, home  and travel O2 prescription;Maintenance of O2 saturations>88%;Compliance with respiratory medication;Verbalizes importance of monitoring SPO2 with pulse oximeter and return demonstration;Exhibits proper breathing techniques, such as pursed lip breathing or other method taught during program session;Demonstrates proper use of MDIs          Oxygen Re-Evaluation:   Oxygen Discharge (Final Oxygen Re-Evaluation):   Initial Exercise Prescription:  Initial Exercise Prescription - 04/25/24 1400       Date of Initial Exercise RX and Referring Provider   Date 04/25/24    Referring Provider Dr. Kara    Expected Discharge Date 07/26/24      Oxygen   Oxygen Continuous    Liters 3    Maintain Oxygen Saturation 88% or higher      T5 Nustep    Level 2    SPM 75    Minutes 15    METs 2.2      Track   Laps 9    Minutes 15    METs 2.38      Prescription Details   Frequency (times per week) 2    Duration Progress to 30 minutes of continuous aerobic without signs/symptoms of physical distress      Intensity   THRR 40-80% of Max Heartrate 62-123    Ratings of Perceived Exertion 11-13    Perceived Dyspnea 0-4      Progression   Progression Continue to progress workloads to maintain intensity without signs/symptoms of physical distress.      Resistance Training   Training Prescription Yes    Weight blue bands    Reps 10-15          Perform Capillary  Blood Glucose checks as needed.  Exercise Prescription Changes:   Exercise Comments:   Exercise Goals and Review:   Exercise Goals     Row Name 04/25/24 1307             Exercise Goals   Increase Physical Activity Yes       Intervention Provide advice, education, support and counseling about physical activity/exercise needs.;Develop an individualized exercise prescription for aerobic and resistive training based on initial evaluation findings, risk stratification, comorbidities and participant's personal goals.       Expected Outcomes Short Term: Attend rehab on a regular basis to increase amount of physical activity.;Long Term: Add in home exercise to make exercise part of routine and to increase amount of physical activity.;Long Term: Exercising regularly at least 3-5 days a week.       Increase Strength and Stamina Yes       Intervention Provide advice, education, support and counseling about physical activity/exercise needs.;Develop an individualized exercise prescription for aerobic and resistive training based on initial evaluation findings, risk stratification, comorbidities and participant's personal goals.       Expected Outcomes Short Term: Increase workloads from initial exercise prescription for resistance, speed, and METs.;Short Term: Perform  resistance training exercises routinely during rehab and add in resistance training at home;Long Term: Improve cardiorespiratory fitness, muscular endurance and strength as measured by increased METs and functional capacity ( )       Able to understand and use rate of perceived exertion (RPE) scale Yes       Intervention Provide education and explanation on how to use RPE scale       Expected Outcomes Short Term: Able to use RPE daily in rehab to express subjective intensity level;Long Term:  Able to use RPE to guide intensity level when exercising independently       Able to understand and use Dyspnea scale Yes       Intervention Provide education and explanation on how to use Dyspnea scale       Expected Outcomes Short Term: Able to use Dyspnea scale daily in rehab to express subjective sense of shortness of breath during exertion;Long Term: Able to use Dyspnea scale to guide intensity level when exercising independently       Knowledge and understanding of Target Heart Rate Range (THRR) Yes       Intervention Provide education and explanation of THRR including how the numbers were predicted and where they are located for reference       Expected Outcomes Short Term: Able to state/look up THRR;Long Term: Able to use THRR to govern intensity when exercising independently;Short Term: Able to use daily as guideline for intensity in rehab       Understanding of Exercise Prescription Yes       Intervention Provide education, explanation, and written materials on patient's individual exercise prescription       Expected Outcomes Short Term: Able to explain program exercise prescription;Long Term: Able to explain home exercise prescription to exercise independently          Exercise Goals Re-Evaluation :   Discharge Exercise Prescription (Final Exercise Prescription Changes):   Nutrition:  Target Goals: Understanding of nutrition guidelines, daily intake of sodium 1500mg , cholesterol 200mg ,  calories 30% from fat and 7% or less from saturated fats, daily to have 5 or more servings of fruits and vegetables.  Biometrics:  Pre Biometrics - 04/25/24 1426       Pre Biometrics   Grip Strength 34  kg           Nutrition Therapy Plan and Nutrition Goals:   Nutrition Assessments:  MEDIFICTS Score Key: >=70 Need to make dietary changes  40-70 Heart Healthy Diet <= 40 Therapeutic Level Cholesterol Diet   Picture Your Plate Scores: <59 Unhealthy dietary pattern with much room for improvement. 41-50 Dietary pattern unlikely to meet recommendations for good health and room for improvement. 51-60 More healthful dietary pattern, with some room for improvement.  >60 Healthy dietary pattern, although there may be some specific behaviors that could be improved.    Nutrition Goals Re-Evaluation:   Nutrition Goals Discharge (Final Nutrition Goals Re-Evaluation):   Psychosocial: Target Goals: Acknowledge presence or absence of significant depression and/or stress, maximize coping skills, provide positive support system. Participant is able to verbalize types and ability to use techniques and skills needed for reducing stress and depression.  Initial Review & Psychosocial Screening:  Initial Psych Review & Screening - 04/25/24 1329       Initial Review   Current issues with None Identified      Family Dynamics   Good Support System? Yes      Barriers   Psychosocial barriers to participate in program There are no identifiable barriers or psychosocial needs.      Screening Interventions   Interventions Encouraged to exercise    Expected Outcomes Short Term goal: Identification and review with participant of any Quality of Life or Depression concerns found by scoring the questionnaire.;Long Term goal: The participant improves quality of Life and PHQ9 Scores as seen by post scores and/or verbalization of changes          Quality of Life Scores:  Scores of 19 and below  usually indicate a poorer quality of life in these areas.  A difference of  2-3 points is a clinically meaningful difference.  A difference of 2-3 points in the total score of the Quality of Life Index has been associated with significant improvement in overall quality of life, self-image, physical symptoms, and general health in studies assessing change in quality of life.  PHQ-9: Review Flowsheet  More data may exist      04/25/2024 06/26/2020 04/18/2019 10/20/2017 06/23/2017  Depression screen PHQ 2/9  Decreased Interest 0 0 0 0 0  Down, Depressed, Hopeless 0 0 0 0 0  PHQ - 2 Score 0 0 0 0 0  Altered sleeping 0 - - - 0  Tired, decreased energy 1 - - - 0  Change in appetite 0 - - - 0  Feeling bad or failure about yourself  0 - - - 0  Trouble concentrating 0 - - - 0  Moving slowly or fidgety/restless 0 - - - 0  Suicidal thoughts 0 - - - 0  PHQ-9 Score 1 - - - 0   Difficult doing work/chores Not difficult at all - - - -    Details       Data saved with a previous flowsheet row definition        Interpretation of Total Score  Total Score Depression Severity:  1-4 = Minimal depression, 5-9 = Mild depression, 10-14 = Moderate depression, 15-19 = Moderately severe depression, 20-27 = Severe depression   Psychosocial Evaluation and Intervention:  Psychosocial Evaluation - 04/25/24 1424       Psychosocial Evaluation & Interventions   Interventions Encouraged to exercise with the program and follow exercise prescription    Comments Zachary Hawkins denies any psy/soc barriers or concerns. He states  he has good support from his family and friends.    Expected Outcomes For Zachary Hawkins to participate in PR without any psy/soc barriers or concerns    Continue Psychosocial Services  No Follow up required          Psychosocial Re-Evaluation:   Psychosocial Discharge (Final Psychosocial Re-Evaluation):   Education: Education Goals: Education classes will be provided on a weekly basis, covering  required topics. Participant will state understanding/return demonstration of topics presented.  Learning Barriers/Preferences:  Learning Barriers/Preferences - 04/25/24 1330       Learning Barriers/Preferences   Learning Barriers Sight    Learning Preferences None          Education Topics: Know Your Numbers Group instruction that is supported by a PowerPoint presentation. Instructor discusses importance of knowing and understanding resting, exercise, and post-exercise oxygen saturation, heart rate, and blood pressure. Oxygen saturation, heart rate, blood pressure, rating of perceived exertion, and dyspnea are reviewed along with a normal range for these values.    Exercise for the Pulmonary Patient Group instruction that is supported by a PowerPoint presentation. Instructor discusses benefits of exercise, core components of exercise, frequency, duration, and intensity of an exercise routine, importance of utilizing pulse oximetry during exercise, safety while exercising, and options of places to exercise outside of rehab.    MET Level  Group instruction provided by PowerPoint, verbal discussion, and written material to support subject matter. Instructor reviews what METs are and how to increase METs.    Pulmonary Medications Verbally interactive group education provided by instructor with focus on inhaled medications and proper administration.   Anatomy and Physiology of the Respiratory System Group instruction provided by PowerPoint, verbal discussion, and written material to support subject matter. Instructor reviews respiratory cycle and anatomical components of the respiratory system and their functions. Instructor also reviews differences in obstructive and restrictive respiratory diseases with examples of each.    Oxygen Safety Group instruction provided by PowerPoint, verbal discussion, and written material to support subject matter. There is an overview of What is  Oxygen and Why do we need it.  Instructor also reviews how to create a safe environment for oxygen use, the importance of using oxygen as prescribed, and the risks of noncompliance. There is a brief discussion on traveling with oxygen and resources the patient may utilize.   Oxygen Use Group instruction provided by PowerPoint, verbal discussion, and written material to discuss how supplemental oxygen is prescribed and different types of oxygen supply systems. Resources for more information are provided.    Breathing Techniques Group instruction that is supported by demonstration and informational handouts. Instructor discusses the benefits of pursed lip and diaphragmatic breathing and detailed demonstration on how to perform both.     Risk Factor Reduction Group instruction that is supported by a PowerPoint presentation. Instructor discusses the definition of a risk factor, different risk factors for pulmonary disease, and how the heart and lungs work together.   Pulmonary Diseases Group instruction provided by PowerPoint, verbal discussion, and written material to support subject matter. Instructor gives an overview of the different type of pulmonary diseases. There is also a discussion on risk factors and symptoms as well as ways to manage the diseases.   Stress and Energy Conservation Group instruction provided by PowerPoint, verbal discussion, and written material to support subject matter. Instructor gives an overview of stress and the impact it can have on the body. Instructor also reviews ways to reduce stress. There is also a discussion on  energy conservation and ways to conserve energy throughout the day.   Warning Signs and Symptoms Group instruction provided by PowerPoint, verbal discussion, and written material to support subject matter. Instructor reviews warning signs and symptoms of stroke, heart attack, cold and flu. Instructor also reviews ways to prevent the spread of  infection.   Other Education Group or individual verbal, written, or video instructions that support the educational goals of the pulmonary rehab program.    Knowledge Questionnaire Score:  Knowledge Questionnaire Score - 04/25/24 1312       Knowledge Questionnaire Score   Pre Score 13/18          Core Components/Risk Factors/Patient Goals at Admission:  Personal Goals and Risk Factors at Admission - 04/25/24 1333       Core Components/Risk Factors/Patient Goals on Admission    Weight Management Yes;Weight Loss    Intervention Weight Management: Develop a combined nutrition and exercise program designed to reach desired caloric intake, while maintaining appropriate intake of nutrient and fiber, sodium and fats, and appropriate energy expenditure required for the weight goal.;Weight Management: Provide education and appropriate resources to help participant work on and attain dietary goals.;Weight Management/Obesity: Establish reasonable short term and long term weight goals.;Obesity: Provide education and appropriate resources to help participant work on and attain dietary goals.    Admit Weight 251 lb 8.7 oz (114.1 kg)    Expected Outcomes Short Term: Continue to assess and modify interventions until short term weight is achieved;Long Term: Adherence to nutrition and physical activity/exercise program aimed toward attainment of established weight goal;Weight Loss: Understanding of general recommendations for a balanced deficit meal plan, which promotes 1-2 lb weight loss per week and includes a negative energy balance of [phone] kcal/d;Understanding recommendations for meals to include 15-35% energy as protein, 25-35% energy from fat, 35-60% energy from carbohydrates, less than 200mg  of dietary cholesterol, 20-35 gm of total fiber daily;Understanding of distribution of calorie intake throughout the day with the consumption of 4-5 meals/snacks    Tobacco Cessation Yes    Number of packs  per day --   smokes marijuana   Intervention Assist the participant in steps to quit. Provide individualized education and counseling about committing to Tobacco Cessation, relapse prevention, and pharmacological support that can be provided by physician.;Education officer, environmental, assist with locating and accessing local/national Quit Smoking programs, and support quit date choice.    Expected Outcomes Short Term: Will demonstrate readiness to quit, by selecting a quit date.;Short Term: Will quit all tobacco product use, adhering to prevention of relapse plan.;Long Term: Complete abstinence from all tobacco products for at least 12 months from quit date.    Improve shortness of breath with ADL's Yes    Intervention Provide education, individualized exercise plan and daily activity instruction to help decrease symptoms of SOB with activities of daily living.    Expected Outcomes Short Term: Improve cardiorespiratory fitness to achieve a reduction of symptoms when performing ADLs;Long Term: Be able to perform more ADLs without symptoms or delay the onset of symptoms          Core Components/Risk Factors/Patient Goals Review:    Core Components/Risk Factors/Patient Goals at Discharge (Final Review):    ITP Comments:   Comments: Dr. Slater Staff is Medical Director for Pulmonary Rehab at [facility].       [1]  Current Outpatient Medications:    albuterol  (PROVENTIL ) (2.5 MG/3ML) 0.083% nebulizer solution, Take 3 mLs (2.5 mg total) by nebulization every 6 (six)  hours as needed for wheezing or shortness of breath., Disp: 75 mL, Rfl: 5   albuterol  (VENTOLIN  HFA) 108 (90 Base) MCG/ACT inhaler, Inhale 2 puffs into the lungs every 4 (four) hours as needed for wheezing or shortness of breath., Disp: 18 g, Rfl: 6   amLODipine  (NORVASC ) 2.5 MG tablet, Take 1 tablet (2.5 mg total) by mouth daily., Disp: 90 tablet, Rfl: 3   amoxicillin  (AMOXIL ) 875 MG tablet, Take 875 mg by mouth 2 (two)  times daily., Disp: , Rfl:    apixaban  (ELIQUIS ) 5 MG TABS tablet, Take 1 tablet (5 mg total) by mouth 2 (two) times daily. PLEASE SCHEDULE APPOINTMENT FOR MORE REFILLS [phone] OPTION 2, Disp: 60 tablet, Rfl: 0   atorvastatin  (LIPITOR) 10 MG tablet, Take 1 tablet (10 mg total) by mouth daily. NEEDS FOLLOW UP APPOINTMENT FOR MORE REFILLS, Disp: 90 tablet, Rfl: 1   atorvastatin  (LIPITOR) 20 MG tablet, Take 20 mg by mouth daily., Disp: , Rfl:    bisoprolol  (ZEBETA ) 5 MG tablet, Take 1 tablet (5 mg total) by mouth daily. PLEASE SCHEDULE APPOINTMENT FOR MORE REFILLS [phone] OPTION 2, Disp: 30 tablet, Rfl: 0   EPINEPHrine  0.3 mg/0.3 mL IJ SOAJ injection, Inject 0.3 mg into the muscle as needed for anaphylaxis., Disp: 1 each, Rfl: 1   famotidine  (PEPCID ) 20 MG tablet, Take 1 tablet (20 mg total) by mouth 2 (two) times daily., Disp: 28 tablet, Rfl: 0   FARXIGA  10 MG TABS tablet, Take 1 tablet (10 mg total) by mouth daily. PLEASE SCHEDULE APPOINTMENT FOR MORE REFILLS [phone] OPTION 2, Disp: 30 tablet, Rfl: 0   fluticasone  (FLONASE ) 50 MCG/ACT nasal spray, PLACE 1 SPRAY INTO BOTH NOSTRILS DAILY., Disp: 16 g, Rfl: 11   fluticasone -salmeterol (ADVAIR  DISKUS) 250-50 MCG/ACT AEPB, Inhale 1 puff into the lungs in the morning and at bedtime., Disp: 60 each, Rfl: 11   OZEMPIC, 0.25 OR 0.5 MG/DOSE, 2 MG/3ML SOPN, Inject 0.5 mg into the skin once a week. Wednesday, Disp: , Rfl:    potassium chloride  SA (KLOR-CON  M) 20 MEQ tablet, Take 1 tablet (20 mEq total) by mouth daily. PLEASE SCHEDULE APPOINTMENT FOR MORE REFILLS, Disp: 30 tablet, Rfl: 0   spironolactone  (ALDACTONE ) 25 MG tablet, Take 1 tablet (25 mg total) by mouth daily. NEEDS FOLLOW UP APPOINTMENT FOR MORE REFILLS, Disp: 90 tablet, Rfl: 1   Tiotropium Bromide Monohydrate  (SPIRIVA  RESPIMAT) 2.5 MCG/ACT AERS, Inhale 2 puffs into the lungs daily., Disp: 4 g, Rfl: 11   torsemide  (DEMADEX ) 20 MG tablet, Take 40 mg by mouth daily., Disp: , Rfl:  [2]   Social History Tobacco Use  Smoking Status Former   Current packs/day: 0.00   Average packs/day: 1 pack/day for 36.0 years (36.0 ttl pk-yrs)   Types: Cigarettes   Start date: 01/08/1985   Quit date: 01/08/2021   Years since quitting: 3.2  Smokeless Tobacco Never

## 2024-04-25 NOTE — Progress Notes (Signed)
 Zachary Hawkins 68 y.o. male Pulmonary Rehab Orientation Note This patient who was referred to Pulmonary Rehab by Dr. Kara with the diagnosis of Chronic respiratory failure arrived today in Cardiac and Pulmonary Rehab. He arrived ambulatory with normal gait. He does carry portable oxygen. [employer] is the provider for their DME. Per patient, Zachary Hawkins uses oxygen continuously. Color good, skin warm and dry. Patient is oriented to time and place. Patient's medical history, psychosocial health, and medications reviewed. Psychosocial assessment reveals patient lives with alone. Zachary Hawkins is currently unemployed, disabled. Patient hobbies include cooking. Patient reports his stress level is low. Areas of stress/anxiety include finances. Patient does not exhibit signs of depression. PHQ2/9 score 0/1. Zachary Hawkins shows good  coping skills with positive outlook on life. Offered emotional support and reassurance. Will continue to monitor. Physical assessment performed by Nurse pick: Zachary Levin RN. Please see their orientation physical assessment note. Zachary Hawkins reports he  does take medications as prescribed. Patient states he  follows a regular  diet. The patient has been trying to lose weight. Patient's weight will be monitored closely. Demonstration and practice of PLB using pulse oximeter. Zachary Hawkins able to return demonstration satisfactorily. Safety and hand hygiene in the exercise area reviewed with patient. Zachary Hawkins voices understanding of the information reviewed. Department expectations discussed with patient and achievable goals were set. The patient shows enthusiasm about attending the program and we look forward to working with Zachary. Zachary Hawkins completed a 6 min walk test today and is scheduled to begin exercise on 05/01/24.   1230-1409 Zachary Hawkins Sharps, BSRT

## 2024-04-26 ENCOUNTER — Encounter (HOSPITAL_COMMUNITY)

## 2024-05-01 ENCOUNTER — Other Ambulatory Visit (HOSPITAL_COMMUNITY): Payer: Self-pay | Admitting: Cardiology

## 2024-05-01 ENCOUNTER — Encounter (HOSPITAL_COMMUNITY)
Admission: RE | Admit: 2024-05-01 | Discharge: 2024-05-01 | Disposition: A | Discharge status: Home / Self Care | Source: Ambulatory Visit | Attending: Pulmonary Disease | Admitting: Pulmonary Disease

## 2024-05-01 VITALS — Wt 314.6 lb

## 2024-05-01 DIAGNOSIS — J9610 Chronic respiratory failure, unspecified whether with hypoxia or hypercapnia: Secondary | ICD-10-CM

## 2024-05-01 NOTE — Progress Notes (Signed)
 Daily Session Note  Patient Details  Name: Zachary Hawkins MRN: [id number] Date of Birth: 04-07-1956 Referring Provider:   Conrad Ports Pulmonary Rehab Walk Test from 04/25/2024 in [facility]  Referring Provider Dr. Kara    Encounter Date: 05/01/2024  Check In:  Session Check In - 05/01/24 1520       Check-In   Supervising physician immediately available to respond to emergencies [facility] MD immediately available    Physician(s) Barnie Press, NP    Location [facility]    Staff Present Ronal Levin, RN, BSN;Kelia Gibbon, RT;Randi Reeve BS, ACSM-CEP, Exercise Physiologist    Virtual Visit No    Medication changes reported     No    Fall or balance concerns reported    Yes    Comments has fallen in the last year    Tobacco Cessation No Change    Warm-up and Cool-down Performed as group-led instruction    Resistance Training Performed Yes    VAD Patient? No    PAD/SET Patient? No      Pain Assessment   Currently in Pain? No/denies    Multiple Pain Sites No          Capillary Blood Glucose: No results found for this or any previous visit (from the past 24 hours).   Exercise Prescription Changes - 05/01/24 1500       Response to Exercise   Blood Pressure (Admit) 110/70    Blood Pressure (Exercise) 140/70    Blood Pressure (Exit) 110/68    Heart Rate (Admit) 94 bpm    Heart Rate (Exercise) 100 bpm    Heart Rate (Exit) 99 bpm    Oxygen Saturation (Admit) 96 %    Oxygen Saturation (Exercise) 96 %    Oxygen Saturation (Exit) 96 %    Rating of Perceived Exertion (Exercise) 13    Perceived Dyspnea (Exercise) 1    Duration Continue with 30 min of aerobic exercise without signs/symptoms of physical distress.    Intensity THRR unchanged      Progression   Progression Continue to progress workloads to maintain intensity without signs/symptoms of physical distress.      Resistance Training    Weight blue bands    Reps 10-15    Time 10 Minutes      Oxygen   Oxygen Continuous    Liters 3      NuStep   Level 2    SPM 47    Minutes 15    METs 1.3      Track   Laps 17    Minutes 15    METs 2.83      Oxygen   Maintain Oxygen Saturation 88% or higher          Tobacco Use History[1]  Goals Met:  Proper associated with RPD/PD & O2 Sat Independence with exercise equipment Exercise tolerated well No report of concerns or symptoms today Strength training completed today  Goals Unmet:  Not Applicable  Comments: Service time is from 1303 to 1446.    Dr. Slater Staff is Medical Director for Pulmonary Rehab at [facility].     [1]  Social History Tobacco Use  Smoking Status Former   Current packs/day: 0.00   Average packs/day: 1 pack/day for 36.0 years (36.0 ttl pk-yrs)   Types: Cigarettes   Start date: 01/08/1985   Quit date: 01/08/2021   Years since quitting: 3.3  Smokeless Tobacco Never

## 2024-05-02 NOTE — Progress Notes (Signed)
 Pulmonary Individual Treatment Plan  Patient Details  Name: Zachary Hawkins MRN: [id number] Date of Birth: 1955/06/23 Referring Provider:   Conrad Ports Pulmonary Rehab Walk Test from 04/25/2024 in [facility] for Heart, Vascular, & Lung Health  Referring Provider Dr. Kara    Initial Encounter Date:  Flowsheet Row Pulmonary Rehab Walk Test from 04/25/2024 in [facility] for Heart, Vascular, & Lung Health  Date 04/25/24    Visit Diagnosis: Chronic respiratory failure, unspecified whether with hypoxia or hypercapnia (HCC)  Patient's Home Medications on Admission:  Current Medications[1]  Past Medical History: Past Medical History:  Diagnosis Date   Asthma    BPH (benign prostatic hyperplasia)    Cardiomyopathy (HCC) 06/2017   EF 40-45%   CHF (congestive heart failure) (HCC)    Chronic renal insufficiency, stage 3 (moderate)    COPD (chronic obstructive pulmonary disease) (HCC)    Diabetes mellitus without complication (HCC)    Diastolic dysfunction 06/2017   grade 2    Hyperlipidemia    Hypertension    Hypertensive urgency    Obesity    Obesity    OSA (obstructive sleep apnea)    Oxygen dependent    Paroxysmal atrial fibrillation (HCC) 2022   on Eliquis     Tobacco Use: Tobacco Use History[2]  Labs: Review Flowsheet  More data exists      Latest Ref Rng & Units 08/15/2021 02/02/2022 05/04/2022 05/05/2022 06/10/2023  Labs for ITP Cardiac and Pulmonary Rehab  Cholestrol 0 - 200 mg/dL - 857  - - -  LDL (calc) 0 - 99 mg/dL - 52  - - -  HDL-C >59 mg/dL - 47  - - -  Trlycerides <150 mg/dL 883  786  - 817  -  Hemoglobin A1c 4.8 - 5.6 % - - 7.2  - 6.5   PH, Arterial 7.35 - 7.45 7.434  7.416  - 7.474  - -  PCO2 arterial 32 - 48 mmHg 62.8  63.1  - 50.0  - -  Bicarbonate 20.0 - 28.0 mmol/L 42.4  40.6  - 36.7  - -  TCO2 22 - 32 mmol/L 44  43  - 38  - -  O2 Saturation % 92  99  - 99  - -    Details       Multiple values  from one day are sorted in reverse-chronological order         Capillary Blood Glucose: Lab Results  Component Value Date   GLUCAP 129 (H) 06/16/2023   GLUCAP 99 06/16/2023   GLUCAP 102 (H) 06/16/2023   GLUCAP 209 (H) 06/10/2023   GLUCAP 129 (H) 05/05/2022     Pulmonary Assessment Scores:  Pulmonary Assessment Scores     Row Name 04/25/24 1421         ADL UCSD   ADL Phase Entry     SOB Score total 22       CAT Score   CAT Score 15       mMRC Score   mMRC Score 3       UCSD: Self-administered rating of dyspnea associated with activities of daily living (ADLs) 6-point scale (0 = not at all to 5 = maximal or unable to do because of breathlessness)  Scoring Scores range from 0 to 120.  Minimally important difference is 5 units  CAT: CAT can identify the health impairment of COPD patients and is better correlated with disease progression.  CAT has  a scoring range of zero to 40. The CAT score is classified into four groups of low (less than 10), medium (10 - 20), high (21-30) and very high (31-40) based on the impact level of disease on health status. A CAT score over 10 suggests significant symptoms.  A worsening CAT score could be explained by an exacerbation, poor medication adherence, poor inhaler technique, or progression of COPD or comorbid conditions.  CAT MCID is 2 points  mMRC: mMRC (Modified Medical Research Council) Dyspnea Scale is used to assess the degree of baseline functional disability in patients of respiratory disease due to dyspnea. No minimal important difference is established. A decrease in score of 1 point or greater is considered a positive change.   Pulmonary Function Assessment:  Pulmonary Function Assessment - 04/25/24 1340       Breath   Bilateral Breath Sounds Decreased    Shortness of Breath Yes;Limiting activity          Exercise Target Goals: Exercise Program Goal: Individual exercise prescription set using results from  initial 6 min walk test and THRR while considering  patients activity barriers and safety.   Exercise Prescription Goal: Initial exercise prescription builds to 30-45 minutes a day of aerobic activity, 2-3 days per week.  Home exercise guidelines will be given to patient during program as part of exercise prescription that the participant will acknowledge.  Activity Barriers & Risk Stratification:  Activity Barriers & Cardiac Risk Stratification - 04/25/24 1334       Activity Barriers & Cardiac Risk Stratification   Activity Barriers Deconditioning;Muscular Weakness;Shortness of Breath;History of Falls    Cardiac Risk Stratification Moderate          6 Minute Walk:  6 Minute Walk     Row Name 04/25/24 1424         6 Minute Walk   Phase Initial     Distance 1200 feet     Walk Time 6 minutes     # of Rest Breaks 0     MPH 2.27     METS 2.33     RPE 7     Perceived Dyspnea  1     VO2 Peak 8.14     Symptoms No     Resting HR 95 bpm     Resting BP 120/80     Resting Oxygen Saturation  97 %     Exercise Oxygen Saturation  during 6 min walk 93 %  on 3L [place]     Max Ex. HR 104 bpm     Max Ex. BP 162/90     2 Minute Post BP 120/88       Interval HR   1 Minute HR 95     2 Minute HR 98     3 Minute HR 101     4 Minute HR 103     5 Minute HR 104     6 Minute HR 104     2 Minute Post HR 95     Interval Heart Rate? Yes       Interval Oxygen   Interval Oxygen? Yes     Baseline Oxygen Saturation % 97 %     1 Minute Oxygen Saturation % 95 %     1 Minute Liters of Oxygen 3 L     2 Minute Oxygen Saturation % 93 %     2 Minute Liters of Oxygen 3 L     3 Minute Oxygen Saturation %  94 %     3 Minute Liters of Oxygen 3 L     4 Minute Oxygen Saturation % 95 %     4 Minute Liters of Oxygen 3 L     5 Minute Oxygen Saturation % 95 %     5 Minute Liters of Oxygen 3 L     6 Minute Oxygen Saturation % 95 %     6 Minute Liters of Oxygen 3 L     2 Minute Post Oxygen Saturation % 97  %     2 Minute Post Liters of Oxygen 3 L        Oxygen Initial Assessment:  Oxygen Initial Assessment - 04/25/24 1339       Home Oxygen   Home Oxygen Device Portable Concentrator;Home Concentrator;E-Tanks    Sleep Oxygen Prescription Continuous    Liters per minute 3    Home Exercise Oxygen Prescription Continuous    Liters per minute 3    Home Resting Oxygen Prescription Continuous    Liters per minute 3    Compliance with Home Oxygen Use Yes      Initial 6 min Walk   Oxygen Used Continuous    Liters per minute 3      Program Oxygen Prescription   Program Oxygen Prescription Continuous    Liters per minute 3      Intervention   Short Term Goals To learn and exhibit compliance with exercise, home and travel O2 prescription;To learn and understand importance of monitoring SPO2 with pulse oximeter and demonstrate accurate use of the pulse oximeter.;To learn and understand importance of maintaining oxygen saturations>88%;To learn and demonstrate proper pursed lip breathing techniques or other breathing techniques. ;To learn and demonstrate proper use of respiratory medications    Long  Term Goals Exhibits compliance with exercise, home  and travel O2 prescription;Maintenance of O2 saturations>88%;Compliance with respiratory medication;Verbalizes importance of monitoring SPO2 with pulse oximeter and return demonstration;Exhibits proper breathing techniques, such as pursed lip breathing or other method taught during program session;Demonstrates proper use of MDIs          Oxygen Re-Evaluation:  Oxygen Re-Evaluation     Row Name 04/27/24 1442             Program Oxygen Prescription   Program Oxygen Prescription Continuous       Liters per minute 3         Home Oxygen   Home Oxygen Device Portable Concentrator;Home Concentrator;E-Tanks       Sleep Oxygen Prescription Continuous       Liters per minute 3       Home Exercise Oxygen Prescription Continuous       Liters per  minute 3       Home Resting Oxygen Prescription Continuous       Liters per minute 3       Compliance with Home Oxygen Use Yes         Goals/Expected Outcomes   Short Term Goals To learn and exhibit compliance with exercise, home and travel O2 prescription;To learn and understand importance of monitoring SPO2 with pulse oximeter and demonstrate accurate use of the pulse oximeter.;To learn and understand importance of maintaining oxygen saturations>88%;To learn and demonstrate proper pursed lip breathing techniques or other breathing techniques. ;To learn and demonstrate proper use of respiratory medications       Long  Term Goals Exhibits compliance with exercise, home  and travel O2 prescription;Maintenance of O2 saturations>88%;Compliance with respiratory medication;Verbalizes  importance of monitoring SPO2 with pulse oximeter and return demonstration;Exhibits proper breathing techniques, such as pursed lip breathing or other method taught during program session;Demonstrates proper use of MDIs       Goals/Expected Outcomes Compliance and understanding of oxygen saturation monitoring and breathing techniques to decrease shortness of breath.          Oxygen Discharge (Final Oxygen Re-Evaluation):  Oxygen Re-Evaluation - 04/27/24 1442       Program Oxygen Prescription   Program Oxygen Prescription Continuous    Liters per minute 3      Home Oxygen   Home Oxygen Device Portable Concentrator;Home Concentrator;E-Tanks    Sleep Oxygen Prescription Continuous    Liters per minute 3    Home Exercise Oxygen Prescription Continuous    Liters per minute 3    Home Resting Oxygen Prescription Continuous    Liters per minute 3    Compliance with Home Oxygen Use Yes      Goals/Expected Outcomes   Short Term Goals To learn and exhibit compliance with exercise, home and travel O2 prescription;To learn and understand importance of monitoring SPO2 with pulse oximeter and demonstrate accurate use of the  pulse oximeter.;To learn and understand importance of maintaining oxygen saturations>88%;To learn and demonstrate proper pursed lip breathing techniques or other breathing techniques. ;To learn and demonstrate proper use of respiratory medications    Long  Term Goals Exhibits compliance with exercise, home  and travel O2 prescription;Maintenance of O2 saturations>88%;Compliance with respiratory medication;Verbalizes importance of monitoring SPO2 with pulse oximeter and return demonstration;Exhibits proper breathing techniques, such as pursed lip breathing or other method taught during program session;Demonstrates proper use of MDIs    Goals/Expected Outcomes Compliance and understanding of oxygen saturation monitoring and breathing techniques to decrease shortness of breath.          Initial Exercise Prescription:  Initial Exercise Prescription - 04/25/24 1400       Date of Initial Exercise RX and Referring Provider   Date 04/25/24    Referring Provider Dr. Kara    Expected Discharge Date 07/26/24      Oxygen   Oxygen Continuous    Liters 3    Maintain Oxygen Saturation 88% or higher      T5 Nustep   Level 2    SPM 75    Minutes 15    METs 2.2      Track   Laps 9    Minutes 15    METs 2.38      Prescription Details   Frequency (times per week) 2    Duration Progress to 30 minutes of continuous aerobic without signs/symptoms of physical distress      Intensity   THRR 40-80% of Max Heartrate 62-123    Ratings of Perceived Exertion 11-13    Perceived Dyspnea 0-4      Progression   Progression Continue to progress workloads to maintain intensity without signs/symptoms of physical distress.      Resistance Training   Training Prescription Yes    Weight blue bands    Reps 10-15          Perform Capillary Blood Glucose checks as needed.  Exercise Prescription Changes:   Exercise Prescription Changes     Row Name 05/01/24 1500             Response to  Exercise   Blood Pressure (Admit) 110/70       Blood Pressure (Exercise) 140/70  Blood Pressure (Exit) 110/68       Heart Rate (Admit) 94 bpm       Heart Rate (Exercise) 100 bpm       Heart Rate (Exit) 99 bpm       Oxygen Saturation (Admit) 96 %       Oxygen Saturation (Exercise) 96 %       Oxygen Saturation (Exit) 96 %       Rating of Perceived Exertion (Exercise) 13       Perceived Dyspnea (Exercise) 1       Duration Continue with 30 min of aerobic exercise without signs/symptoms of physical distress.       Intensity THRR unchanged         Progression   Progression Continue to progress workloads to maintain intensity without signs/symptoms of physical distress.         Resistance Training   Weight blue bands       Reps 10-15       Time 10 Minutes         Oxygen   Oxygen Continuous       Liters 3         NuStep   Level 2       SPM 47       Minutes 15       METs 1.3         Track   Laps 17       Minutes 15       METs 2.83         Oxygen   Maintain Oxygen Saturation 88% or higher          Exercise Comments:   Exercise Goals and Review:   Exercise Goals     Row Name 04/25/24 1307             Exercise Goals   Increase Physical Activity Yes       Intervention Provide advice, education, support and counseling about physical activity/exercise needs.;Develop an individualized exercise prescription for aerobic and resistive training based on initial evaluation findings, risk stratification, comorbidities and participant's personal goals.       Expected Outcomes Short Term: Attend rehab on a regular basis to increase amount of physical activity.;Long Term: Add in home exercise to make exercise part of routine and to increase amount of physical activity.;Long Term: Exercising regularly at least 3-5 days a week.       Increase Strength and Stamina Yes       Intervention Provide advice, education, support and counseling about physical activity/exercise  needs.;Develop an individualized exercise prescription for aerobic and resistive training based on initial evaluation findings, risk stratification, comorbidities and participant's personal goals.       Expected Outcomes Short Term: Increase workloads from initial exercise prescription for resistance, speed, and METs.;Short Term: Perform resistance training exercises routinely during rehab and add in resistance training at home;Long Term: Improve cardiorespiratory fitness, muscular endurance and strength as measured by increased METs and functional capacity ( )       Able to understand and use rate of perceived exertion (RPE) scale Yes       Intervention Provide education and explanation on how to use RPE scale       Expected Outcomes Short Term: Able to use RPE daily in rehab to express subjective intensity level;Long Term:  Able to use RPE to guide intensity level when exercising independently       Able to understand and use  Dyspnea scale Yes       Intervention Provide education and explanation on how to use Dyspnea scale       Expected Outcomes Short Term: Able to use Dyspnea scale daily in rehab to express subjective sense of shortness of breath during exertion;Long Term: Able to use Dyspnea scale to guide intensity level when exercising independently       Knowledge and understanding of Target Heart Rate Range (THRR) Yes       Intervention Provide education and explanation of THRR including how the numbers were predicted and where they are located for reference       Expected Outcomes Short Term: Able to state/look up THRR;Long Term: Able to use THRR to govern intensity when exercising independently;Short Term: Able to use daily as guideline for intensity in rehab       Understanding of Exercise Prescription Yes       Intervention Provide education, explanation, and written materials on patient's individual exercise prescription       Expected Outcomes Short Term: Able to explain program  exercise prescription;Long Term: Able to explain home exercise prescription to exercise independently          Exercise Goals Re-Evaluation :  Exercise Goals Re-Evaluation     Row Name 04/27/24 1442             Exercise Goal Re-Evaluation   Exercise Goals Review Increase Physical Activity;Able to understand and use Dyspnea scale;Understanding of Exercise Prescription;Increase Strength and Stamina;Knowledge and understanding of Target Heart Rate Range (THRR);Able to understand and use rate of perceived exertion (RPE) scale       Comments Pt to begin exercise 12/23. Will progress as tolerated.       Expected Outcomes Through exercise at rehab and home, the patient will decrease shortness of breath with daily activities and feel confident in carrying out an exercise regimen at home.          Discharge Exercise Prescription (Final Exercise Prescription Changes):  Exercise Prescription Changes - 05/01/24 1500       Response to Exercise   Blood Pressure (Admit) 110/70    Blood Pressure (Exercise) 140/70    Blood Pressure (Exit) 110/68    Heart Rate (Admit) 94 bpm    Heart Rate (Exercise) 100 bpm    Heart Rate (Exit) 99 bpm    Oxygen Saturation (Admit) 96 %    Oxygen Saturation (Exercise) 96 %    Oxygen Saturation (Exit) 96 %    Rating of Perceived Exertion (Exercise) 13    Perceived Dyspnea (Exercise) 1    Duration Continue with 30 min of aerobic exercise without signs/symptoms of physical distress.    Intensity THRR unchanged      Progression   Progression Continue to progress workloads to maintain intensity without signs/symptoms of physical distress.      Resistance Training   Weight blue bands    Reps 10-15    Time 10 Minutes      Oxygen   Oxygen Continuous    Liters 3      NuStep   Level 2    SPM 47    Minutes 15    METs 1.3      Track   Laps 17    Minutes 15    METs 2.83      Oxygen   Maintain Oxygen Saturation 88% or higher          Nutrition:   Target Goals: Understanding of nutrition guidelines, daily  intake of sodium 1500mg , cholesterol 200mg , calories 30% from fat and 7% or less from saturated fats, daily to have 5 or more servings of fruits and vegetables.  Biometrics:  Pre Biometrics - 04/25/24 1426       Pre Biometrics   Grip Strength 34 kg           Nutrition Therapy Plan and Nutrition Goals:  Nutrition Therapy & Goals - 05/01/24 1444       Nutrition Therapy   Diet Regular diet    Drug/Food Interactions Statins/Certain Fruits      Personal Nutrition Goals   Nutrition Goal Patient to improve diet quality by using the plate method as a guide for meal planning to include lean protein/plant protein, fruits, vegetables, whole grains, nonfat dairy as part of a well-balanced diet.    Personal Goal #2 Patient to identify strategies for weight loss with goal of 0.5-2 # per week of weight loss.    Comments Patient with history of h/o HFpEF, OSA, CPAP, CKD Stage III, HTN, obesity,and PAF. Pt reports weight loss of 3 lb over past week due to starting Ozempic. Endorses early satiety with meals. Examples of meals include 1/2 steak and cheese croissant with fries, chicken wings. Typically consumes baked vs fried chicken. Other animal protein includes steak and shrimp. Pt reports experience as [place]; often uses lemon juice, wine and broth to add flavor. Also reports increasing intake of vegetables such as spinach, peppers. RD provided assistance if helping pt complete Picture Your Plate Survey. Will continue to provide assistance if making healthy diet changes. Patient will benefit from participation in pulmonary rehab for nutrition education, exercise, and lifestyle modification.      Intervention Plan   Intervention Prescribe, educate and counsel regarding individualized specific dietary modifications aiming towards targeted core components such as weight, hypertension, lipid management, diabetes, heart failure and other  comorbidities.    Expected Outcomes Short Term Goal: Understand basic principles of dietary content, such as calories, fat, sodium, cholesterol and nutrients.;Long Term Goal: Adherence to prescribed nutrition plan.          Nutrition Assessments:  MEDIFICTS Score Key: >=70 Need to make dietary changes  40-70 Heart Healthy Diet <= 40 Therapeutic Level Cholesterol Diet  Flowsheet Row PULMONARY REHAB OTHER RESPIRATORY from 05/01/2024 in [facility] for Heart, Vascular, & Lung Health  Picture Your Plate Total Score on Admission 53   Picture Your Plate Scores: <59 Unhealthy dietary pattern with much room for improvement. 41-50 Dietary pattern unlikely to meet recommendations for good health and room for improvement. 51-60 More healthful dietary pattern, with some room for improvement.  >60 Healthy dietary pattern, although there may be some specific behaviors that could be improved.    Nutrition Goals Re-Evaluation:   Nutrition Goals Discharge (Final Nutrition Goals Re-Evaluation):   Psychosocial: Target Goals: Acknowledge presence or absence of significant depression and/or stress, maximize coping skills, provide positive support system. Participant is able to verbalize types and ability to use techniques and skills needed for reducing stress and depression.  Initial Review & Psychosocial Screening:  Initial Psych Review & Screening - 04/25/24 1329       Initial Review   Current issues with None Identified      Family Dynamics   Good Support System? Yes      Barriers   Psychosocial barriers to participate in program There are no identifiable barriers or psychosocial needs.      Screening Interventions   Interventions Encouraged  to exercise    Expected Outcomes Short Term goal: Identification and review with participant of any Quality of Life or Depression concerns found by scoring the questionnaire.;Long Term goal: The participant improves quality  of Life and PHQ9 Scores as seen by post scores and/or verbalization of changes          Quality of Life Scores:  Scores of 19 and below usually indicate a poorer quality of life in these areas.  A difference of  2-3 points is a clinically meaningful difference.  A difference of 2-3 points in the total score of the Quality of Life Index has been associated with significant improvement in overall quality of life, self-image, physical symptoms, and general health in studies assessing change in quality of life.  PHQ-9: Review Flowsheet  More data may exist      04/25/2024 06/26/2020 04/18/2019 10/20/2017 06/23/2017  Depression screen PHQ 2/9  Decreased Interest 0 0 0 0 0  Down, Depressed, Hopeless 0 0 0 0 0  PHQ - 2 Score 0 0 0 0 0  Altered sleeping 0 - - - 0  Tired, decreased energy 1 - - - 0  Change in appetite 0 - - - 0  Feeling bad or failure about yourself  0 - - - 0  Trouble concentrating 0 - - - 0  Moving slowly or fidgety/restless 0 - - - 0  Suicidal thoughts 0 - - - 0  PHQ-9 Score 1 - - - 0   Difficult doing work/chores Not difficult at all - - - -    Details       Data saved with a previous flowsheet row definition        Interpretation of Total Score  Total Score Depression Severity:  1-4 = Minimal depression, 5-9 = Mild depression, 10-14 = Moderate depression, 15-19 = Moderately severe depression, 20-27 = Severe depression   Psychosocial Evaluation and Intervention:  Psychosocial Evaluation - 04/25/24 1424       Psychosocial Evaluation & Interventions   Interventions Encouraged to exercise with the program and follow exercise prescription    Comments Williams denies any psy/soc barriers or concerns. He states he has good support from his family and friends.    Expected Outcomes For Zhane to participate in PR without any psy/soc barriers or concerns    Continue Psychosocial Services  No Follow up required          Psychosocial Re-Evaluation:  Psychosocial  Re-Evaluation     Row Name 04/27/24 [phone]             Psychosocial Re-Evaluation   Current issues with None Identified       Comments 30 day psy/soc re-eval as follows: No changes since oreintation. Paola is scheduled to start the program next week.       Expected Outcomes For Aristotelis to participate in PR without any psy/soc barriers or concerns       Interventions Encouraged to attend Pulmonary Rehabilitation for the exercise       Continue Psychosocial Services  Follow up required by staff          Psychosocial Discharge (Final Psychosocial Re-Evaluation):  Psychosocial Re-Evaluation - 04/27/24 0954       Psychosocial Re-Evaluation   Current issues with None Identified    Comments 30 day psy/soc re-eval as follows: No changes since oreintation. Abdon is scheduled to start the program next week.    Expected Outcomes For Magdiel to participate in PR without any  psy/soc barriers or concerns    Interventions Encouraged to attend Pulmonary Rehabilitation for the exercise    Continue Psychosocial Services  Follow up required by staff          Education: Education Goals: Education classes will be provided on a weekly basis, covering required topics. Participant will state understanding/return demonstration of topics presented.  Learning Barriers/Preferences:  Learning Barriers/Preferences - 04/25/24 1330       Learning Barriers/Preferences   Learning Barriers Sight    Learning Preferences None          Education Topics: Know Your Numbers Group instruction that is supported by a PowerPoint presentation. Instructor discusses importance of knowing and understanding resting, exercise, and post-exercise oxygen saturation, heart rate, and blood pressure. Oxygen saturation, heart rate, blood pressure, rating of perceived exertion, and dyspnea are reviewed along with a normal range for these values.    Exercise for the Pulmonary Patient Group instruction that is supported  by a PowerPoint presentation. Instructor discusses benefits of exercise, core components of exercise, frequency, duration, and intensity of an exercise routine, importance of utilizing pulse oximetry during exercise, safety while exercising, and options of places to exercise outside of rehab.    MET Level  Group instruction provided by PowerPoint, verbal discussion, and written material to support subject matter. Instructor reviews what METs are and how to increase METs.    Pulmonary Medications Verbally interactive group education provided by instructor with focus on inhaled medications and proper administration.   Anatomy and Physiology of the Respiratory System Group instruction provided by PowerPoint, verbal discussion, and written material to support subject matter. Instructor reviews respiratory cycle and anatomical components of the respiratory system and their functions. Instructor also reviews differences in obstructive and restrictive respiratory diseases with examples of each.    Oxygen Safety Group instruction provided by PowerPoint, verbal discussion, and written material to support subject matter. There is an overview of What is Oxygen and Why do we need it.  Instructor also reviews how to create a safe environment for oxygen use, the importance of using oxygen as prescribed, and the risks of noncompliance. There is a brief discussion on traveling with oxygen and resources the patient may utilize.   Oxygen Use Group instruction provided by PowerPoint, verbal discussion, and written material to discuss how supplemental oxygen is prescribed and different types of oxygen supply systems. Resources for more information are provided.    Breathing Techniques Group instruction that is supported by demonstration and informational handouts. Instructor discusses the benefits of pursed lip and diaphragmatic breathing and detailed demonstration on how to perform both.     Risk Factor  Reduction Group instruction that is supported by a PowerPoint presentation. Instructor discusses the definition of a risk factor, different risk factors for pulmonary disease, and how the heart and lungs work together.   Pulmonary Diseases Group instruction provided by PowerPoint, verbal discussion, and written material to support subject matter. Instructor gives an overview of the different type of pulmonary diseases. There is also a discussion on risk factors and symptoms as well as ways to manage the diseases.   Stress and Energy Conservation Group instruction provided by PowerPoint, verbal discussion, and written material to support subject matter. Instructor gives an overview of stress and the impact it can have on the body. Instructor also reviews ways to reduce stress. There is also a discussion on energy conservation and ways to conserve energy throughout the day.   Warning Signs and Symptoms Group instruction provided by  PowerPoint, verbal discussion, and written material to support subject matter. Instructor reviews warning signs and symptoms of stroke, heart attack, cold and flu. Instructor also reviews ways to prevent the spread of infection.   Other Education Group or individual verbal, written, or video instructions that support the educational goals of the pulmonary rehab program.    Knowledge Questionnaire Score:  Knowledge Questionnaire Score - 04/25/24 1312       Knowledge Questionnaire Score   Pre Score 13/18          Core Components/Risk Factors/Patient Goals at Admission:  Personal Goals and Risk Factors at Admission - 04/25/24 1333       Core Components/Risk Factors/Patient Goals on Admission    Weight Management Yes;Weight Loss    Intervention Weight Management: Develop a combined nutrition and exercise program designed to reach desired caloric intake, while maintaining appropriate intake of nutrient and fiber, sodium and fats, and appropriate energy  expenditure required for the weight goal.;Weight Management: Provide education and appropriate resources to help participant work on and attain dietary goals.;Weight Management/Obesity: Establish reasonable short term and long term weight goals.;Obesity: Provide education and appropriate resources to help participant work on and attain dietary goals.    Admit Weight 251 lb 8.7 oz (114.1 kg)    Expected Outcomes Short Term: Continue to assess and modify interventions until short term weight is achieved;Long Term: Adherence to nutrition and physical activity/exercise program aimed toward attainment of established weight goal;Weight Loss: Understanding of general recommendations for a balanced deficit meal plan, which promotes 1-2 lb weight loss per week and includes a negative energy balance of [phone] kcal/d;Understanding recommendations for meals to include 15-35% energy as protein, 25-35% energy from fat, 35-60% energy from carbohydrates, less than 200mg  of dietary cholesterol, 20-35 gm of total fiber daily;Understanding of distribution of calorie intake throughout the day with the consumption of 4-5 meals/snacks    Tobacco Cessation Yes    Number of packs per day --   smokes marijuana   Intervention Assist the participant in steps to quit. Provide individualized education and counseling about committing to Tobacco Cessation, relapse prevention, and pharmacological support that can be provided by physician.;Education officer, environmental, assist with locating and accessing local/national Quit Smoking programs, and support quit date choice.    Expected Outcomes Short Term: Will demonstrate readiness to quit, by selecting a quit date.;Short Term: Will quit all tobacco product use, adhering to prevention of relapse plan.;Long Term: Complete abstinence from all tobacco products for at least 12 months from quit date.    Improve shortness of breath with ADL's Yes    Intervention Provide education, individualized  exercise plan and daily activity instruction to help decrease symptoms of SOB with activities of daily living.    Expected Outcomes Short Term: Improve cardiorespiratory fitness to achieve a reduction of symptoms when performing ADLs;Long Term: Be able to perform more ADLs without symptoms or delay the onset of symptoms          Core Components/Risk Factors/Patient Goals Review:   Goals and Risk Factor Review     Row Name 04/27/24 0955             Core Components/Risk Factors/Patient Goals Review   Personal Goals Review Weight Management/Obesity;Tobacco Cessation;Improve shortness of breath with ADL's;Develop more efficient breathing techniques such as purse lipped breathing and diaphragmatic breathing and practicing self-pacing with activity.       Review 30 day Core Components/Risk Factors/Patient Goals Review as follows: No changes since orientation. Leshawn is  scheduled to start the program next week.       Expected Outcomes Pt will show progress toward meeting expected goals and outcomes.          Core Components/Risk Factors/Patient Goals at Discharge (Final Review):   Goals and Risk Factor Review - 04/27/24 0955       Core Components/Risk Factors/Patient Goals Review   Personal Goals Review Weight Management/Obesity;Tobacco Cessation;Improve shortness of breath with ADL's;Develop more efficient breathing techniques such as purse lipped breathing and diaphragmatic breathing and practicing self-pacing with activity.    Review 30 day Core Components/Risk Factors/Patient Goals Review as follows: No changes since orientation. Chozen is scheduled to start the program next week.    Expected Outcomes Pt will show progress toward meeting expected goals and outcomes.          ITP Comments:   Comments: Pt is making expected progress toward Pulmonary Rehab goals after completing 1 session(s). Recommend continued exercise, life style modification, education, and utilization of  breathing techniques to increase stamina and strength, while also decreasing shortness of breath with exertion.  Dr. Slater Staff is Medical Director for Pulmonary Rehab at [facility].       [1]  Current Outpatient Medications:    albuterol  (PROVENTIL ) (2.5 MG/3ML) 0.083% nebulizer solution, Take 3 mLs (2.5 mg total) by nebulization every 6 (six) hours as needed for wheezing or shortness of breath., Disp: 75 mL, Rfl: 5   albuterol  (VENTOLIN  HFA) 108 (90 Base) MCG/ACT inhaler, Inhale 2 puffs into the lungs every 4 (four) hours as needed for wheezing or shortness of breath., Disp: 18 g, Rfl: 6   amLODipine  (NORVASC ) 2.5 MG tablet, Take 1 tablet (2.5 mg total) by mouth daily., Disp: 90 tablet, Rfl: 3   amoxicillin  (AMOXIL ) 875 MG tablet, Take 875 mg by mouth 2 (two) times daily., Disp: , Rfl:    apixaban  (ELIQUIS ) 5 MG TABS tablet, Take 1 tablet (5 mg total) by mouth 2 (two) times daily. PLEASE SCHEDULE APPOINTMENT FOR MORE REFILLS [phone] OPTION 2, Disp: 60 tablet, Rfl: 0   atorvastatin  (LIPITOR) 10 MG tablet, Take 1 tablet (10 mg total) by mouth daily. NEEDS FOLLOW UP APPOINTMENT FOR MORE REFILLS, Disp: 90 tablet, Rfl: 1   atorvastatin  (LIPITOR) 20 MG tablet, Take 20 mg by mouth daily., Disp: , Rfl:    bisoprolol  (ZEBETA ) 5 MG tablet, Take 1 tablet (5 mg total) by mouth daily. PLEASE SCHEDULE APPOINTMENT FOR MORE REFILLS [phone] OPTION 2, Disp: 30 tablet, Rfl: 0   EPINEPHrine  0.3 mg/0.3 mL IJ SOAJ injection, Inject 0.3 mg into the muscle as needed for anaphylaxis., Disp: 1 each, Rfl: 1   famotidine  (PEPCID ) 20 MG tablet, Take 1 tablet (20 mg total) by mouth 2 (two) times daily., Disp: 28 tablet, Rfl: 0   FARXIGA  10 MG TABS tablet, Take 1 tablet (10 mg total) by mouth daily. PLEASE SCHEDULE APPOINTMENT FOR MORE REFILLS [phone] OPTION 2, Disp: 30 tablet, Rfl: 0   fluticasone  (FLONASE ) 50 MCG/ACT nasal spray, PLACE 1 SPRAY INTO BOTH NOSTRILS DAILY., Disp: 16 g, Rfl: 11    fluticasone -salmeterol (ADVAIR  DISKUS) 250-50 MCG/ACT AEPB, Inhale 1 puff into the lungs in the morning and at bedtime., Disp: 60 each, Rfl: 11   OZEMPIC, 0.25 OR 0.5 MG/DOSE, 2 MG/3ML SOPN, Inject 0.5 mg into the skin once a week. Wednesday, Disp: , Rfl:    potassium chloride  SA (KLOR-CON  M) 20 MEQ tablet, Take 1 tablet (20 mEq total) by mouth daily. PLEASE SCHEDULE APPOINTMENT  FOR MORE REFILLS, Disp: 30 tablet, Rfl: 0   spironolactone  (ALDACTONE ) 25 MG tablet, Take 1 tablet (25 mg total) by mouth daily. NEEDS FOLLOW UP APPOINTMENT FOR MORE REFILLS, Disp: 90 tablet, Rfl: 1   Tiotropium Bromide Monohydrate  (SPIRIVA  RESPIMAT) 2.5 MCG/ACT AERS, Inhale 2 puffs into the lungs daily., Disp: 4 g, Rfl: 11   torsemide  (DEMADEX ) 20 MG tablet, Take 40 mg by mouth daily., Disp: , Rfl:  [2]  Social History Tobacco Use  Smoking Status Former   Current packs/day: 0.00   Average packs/day: 1 pack/day for 36.0 years (36.0 ttl pk-yrs)   Types: Cigarettes   Start date: 01/08/1985   Quit date: 01/08/2021   Years since quitting: 3.3  Smokeless Tobacco Never

## 2024-05-08 ENCOUNTER — Telehealth (HOSPITAL_COMMUNITY): Payer: Self-pay

## 2024-05-08 ENCOUNTER — Encounter (HOSPITAL_COMMUNITY): Admission: RE | Admit: 2024-05-08 | Source: Ambulatory Visit

## 2024-05-08 NOTE — Telephone Encounter (Signed)
 Patient c/o for 1:15 PR class, no reason given.

## 2024-05-09 ENCOUNTER — Telehealth (HOSPITAL_COMMUNITY): Payer: Self-pay | Admitting: *Deleted

## 2024-05-09 NOTE — Telephone Encounter (Signed)
 Called pt and LVM stating we will not be in class for PR 1/1. Aliene Aris BS, ACSM-CEP 05/09/2024 10:54 AM

## 2024-05-14 ENCOUNTER — Ambulatory Visit (HOSPITAL_COMMUNITY): Admitting: Cardiology

## 2024-05-14 ENCOUNTER — Other Ambulatory Visit (HOSPITAL_COMMUNITY)

## 2024-05-15 ENCOUNTER — Telehealth (HOSPITAL_COMMUNITY): Payer: Self-pay

## 2024-05-15 ENCOUNTER — Encounter (HOSPITAL_COMMUNITY): Admission: RE | Admit: 2024-05-15 | Source: Ambulatory Visit

## 2024-05-15 NOTE — Telephone Encounter (Signed)
 Patient called requesting to change his pulmonary rehab classes from 1:15 to 10:15 due to consistent scheduling conflicts. He will start the 10:15 class on Thursday 1/08.

## 2024-05-17 ENCOUNTER — Encounter (HOSPITAL_COMMUNITY)

## 2024-05-17 ENCOUNTER — Encounter (HOSPITAL_COMMUNITY)
Admission: RE | Admit: 2024-05-17 | Discharge: 2024-05-17 | Disposition: A | Discharge status: Home / Self Care | Source: Ambulatory Visit | Attending: Pulmonary Disease | Admitting: Pulmonary Disease

## 2024-05-17 ENCOUNTER — Telehealth: Payer: Self-pay

## 2024-05-17 VITALS — Wt 315.0 lb

## 2024-05-17 DIAGNOSIS — J9610 Chronic respiratory failure, unspecified whether with hypoxia or hypercapnia: Secondary | ICD-10-CM | POA: Diagnosis present

## 2024-05-17 NOTE — Progress Notes (Signed)
 Daily Session Note  Patient Details  Name: Zachary Hawkins MRN: [id number] Date of Birth: 1955-12-06 Referring Provider:   Conrad Ports Pulmonary Rehab Walk Test from 04/25/2024 in [facility]  Referring Provider Dr. Kara    Encounter Date: 05/17/2024  Check In:  Session Check In - 05/17/24 0814       Check-In   Supervising physician immediately available to respond to emergencies [facility] MD immediately available    Physician(s) Damien Braver, NP    Location [facility]    Staff Present Ronal Levin, RN, BSN;Kalynn Declercq, RT;Randi Reeve BS, ACSM-CEP, Exercise Physiologist    Virtual Visit No    Medication changes reported     No    Fall or balance concerns reported    Yes    Comments has fallen in the last year    Tobacco Cessation No Change    Warm-up and Cool-down Performed as group-led instruction    Resistance Training Performed Yes    VAD Patient? No    PAD/SET Patient? No      Pain Assessment   Currently in Pain? No/denies    Multiple Pain Sites No          Capillary Blood Glucose: No results found for this or any previous visit (from the past 24 hours).    Tobacco Use History[1]  Goals Met:  Proper associated with RPD/PD & O2 Sat Independence with exercise equipment Exercise tolerated well No report of concerns or symptoms today Strength training completed today  Goals Unmet:  Not Applicable  Comments: Service time is from 0810 to 0932.    Dr. Slater Staff is Medical Director for Pulmonary Rehab at [facility].     [1]  Social History Tobacco Use  Smoking Status Former   Current packs/day: 0.00   Average packs/day: 1 pack/day for 36.0 years (36.0 ttl pk-yrs)   Types: Cigarettes   Start date: 01/08/1985   Quit date: 01/08/2021   Years since quitting: 3.3  Smokeless Tobacco Never

## 2024-05-17 NOTE — Telephone Encounter (Signed)
 Copied from CRM #[id number]. Topic: Clinical - Order For Equipment >> May 17, 2024  4:41 PM Rilla B wrote: Reason for CRM: Patient had request for CPAP in November (11/10 [place]).  States have not heard anything regarding the CPAP and no one has called him.  I do not see the name of DME company.  Please call patient @ [phone].   Please advise PCC.  Referral dated 03/19/2024

## 2024-05-22 ENCOUNTER — Encounter (HOSPITAL_COMMUNITY)

## 2024-05-22 ENCOUNTER — Telehealth (HOSPITAL_COMMUNITY): Payer: Self-pay

## 2024-05-22 ENCOUNTER — Encounter (HOSPITAL_COMMUNITY): Admission: RE | Admit: 2024-05-22 | Source: Ambulatory Visit

## 2024-05-22 NOTE — Telephone Encounter (Signed)
 Called and spoke with [employer]--they stated order had never been received and if I would resend it, they would call the patient and deliever the CPAP today.  LVM for patient with this information and requested return call with questions or concerns

## 2024-05-22 NOTE — Telephone Encounter (Signed)
 Patient left message calling out for 8:15 PR class, stated he has a dr appt today.

## 2024-05-24 ENCOUNTER — Encounter (HOSPITAL_COMMUNITY): Admission: RE | Admit: 2024-05-24 | Source: Ambulatory Visit

## 2024-05-24 ENCOUNTER — Encounter (HOSPITAL_COMMUNITY)

## 2024-05-25 ENCOUNTER — Telehealth (HOSPITAL_COMMUNITY): Payer: Self-pay | Admitting: Cardiology

## 2024-05-25 NOTE — Telephone Encounter (Signed)
 Called to confirm/remind patient of their appointment at [facility] on 05/25/24.   Appointment:   [x] Confirmed  [] Left mess   [] No answer/No voice mail  [] VM Full/unable to leave message  [] Phone not in service  Patient reminded to bring all medications and/or complete list.  Confirmed patient has transportation. Gave directions, instructed to utilize valet parking.

## 2024-05-28 ENCOUNTER — Encounter (HOSPITAL_COMMUNITY): Payer: Self-pay | Admitting: Cardiology

## 2024-05-28 ENCOUNTER — Ambulatory Visit (HOSPITAL_COMMUNITY): Payer: Self-pay | Admitting: Cardiology

## 2024-05-28 ENCOUNTER — Ambulatory Visit (HOSPITAL_COMMUNITY)
Admission: RE | Admit: 2024-05-28 | Discharge: 2024-05-28 | Disposition: A | Source: Ambulatory Visit | Attending: Cardiology | Admitting: Cardiology

## 2024-05-28 VITALS — BP 126/84 | HR 97 | Ht 72.0 in | Wt 312.0 lb

## 2024-05-28 DIAGNOSIS — R0601 Orthopnea: Secondary | ICD-10-CM | POA: Insufficient documentation

## 2024-05-28 DIAGNOSIS — Z7984 Long term (current) use of oral hypoglycemic drugs: Secondary | ICD-10-CM | POA: Insufficient documentation

## 2024-05-28 DIAGNOSIS — I5032 Chronic diastolic (congestive) heart failure: Secondary | ICD-10-CM | POA: Insufficient documentation

## 2024-05-28 DIAGNOSIS — Z59868 Other specified financial insecurity: Secondary | ICD-10-CM | POA: Diagnosis not present

## 2024-05-28 DIAGNOSIS — Z7901 Long term (current) use of anticoagulants: Secondary | ICD-10-CM | POA: Insufficient documentation

## 2024-05-28 DIAGNOSIS — E785 Hyperlipidemia, unspecified: Secondary | ICD-10-CM | POA: Insufficient documentation

## 2024-05-28 DIAGNOSIS — I480 Paroxysmal atrial fibrillation: Secondary | ICD-10-CM | POA: Diagnosis not present

## 2024-05-28 DIAGNOSIS — Z79899 Other long term (current) drug therapy: Secondary | ICD-10-CM | POA: Insufficient documentation

## 2024-05-28 DIAGNOSIS — I130 Hypertensive heart and chronic kidney disease with heart failure and stage 1 through stage 4 chronic kidney disease, or unspecified chronic kidney disease: Secondary | ICD-10-CM | POA: Diagnosis not present

## 2024-05-28 DIAGNOSIS — G4733 Obstructive sleep apnea (adult) (pediatric): Secondary | ICD-10-CM | POA: Diagnosis not present

## 2024-05-28 DIAGNOSIS — R0602 Shortness of breath: Secondary | ICD-10-CM | POA: Insufficient documentation

## 2024-05-28 DIAGNOSIS — E1122 Type 2 diabetes mellitus with diabetic chronic kidney disease: Secondary | ICD-10-CM | POA: Insufficient documentation

## 2024-05-28 DIAGNOSIS — E669 Obesity, unspecified: Secondary | ICD-10-CM | POA: Insufficient documentation

## 2024-05-28 DIAGNOSIS — J449 Chronic obstructive pulmonary disease, unspecified: Secondary | ICD-10-CM | POA: Insufficient documentation

## 2024-05-28 DIAGNOSIS — Z6841 Body Mass Index (BMI) 40.0 and over, adult: Secondary | ICD-10-CM | POA: Insufficient documentation

## 2024-05-28 DIAGNOSIS — Z7985 Long-term (current) use of injectable non-insulin antidiabetic drugs: Secondary | ICD-10-CM | POA: Insufficient documentation

## 2024-05-28 DIAGNOSIS — Z87891 Personal history of nicotine dependence: Secondary | ICD-10-CM | POA: Diagnosis not present

## 2024-05-28 DIAGNOSIS — N183 Chronic kidney disease, stage 3 unspecified: Secondary | ICD-10-CM | POA: Insufficient documentation

## 2024-05-28 DIAGNOSIS — I358 Other nonrheumatic aortic valve disorders: Secondary | ICD-10-CM | POA: Insufficient documentation

## 2024-05-28 LAB — ECHOCARDIOGRAM COMPLETE
AR max vel: 3.45 cm2
AV Area VTI: 3.34 cm2
AV Area mean vel: 2.72 cm2
AV Mean grad: 4 mmHg
AV Peak grad: 6.3 mmHg
Ao pk vel: 1.25 m/s
Area-P 1/2: 1.55 cm2
Calc EF: 58.5 %
S' Lateral: 3.2 cm
Single Plane A2C EF: 57.4 %
Single Plane A4C EF: 54.6 %

## 2024-05-28 LAB — CBC
HCT: 46.1 % (ref 39.0–52.0)
Hemoglobin: 15.2 g/dL (ref 13.0–17.0)
MCH: 30.5 pg (ref 26.0–34.0)
MCHC: 33 g/dL (ref 30.0–36.0)
MCV: 92.6 fL (ref 80.0–100.0)
Platelets: 299 K/uL (ref 150–400)
RBC: 4.98 MIL/uL (ref 4.22–5.81)
RDW: 14.5 % (ref 11.5–15.5)
WBC: 8.6 K/uL (ref 4.0–10.5)
nRBC: 0 % (ref 0.0–0.2)

## 2024-05-28 LAB — BASIC METABOLIC PANEL WITH GFR
Anion gap: 12 (ref 5–15)
BUN: 25 mg/dL — ABNORMAL HIGH (ref 8–23)
CO2: 26 mmol/L (ref 22–32)
Calcium: 9 mg/dL (ref 8.9–10.3)
Chloride: 100 mmol/L (ref 98–111)
Creatinine, Ser: 1.87 mg/dL — ABNORMAL HIGH (ref 0.61–1.24)
GFR, Estimated: 39 mL/min — ABNORMAL LOW
Glucose, Bld: 120 mg/dL — ABNORMAL HIGH (ref 70–99)
Potassium: 4.7 mmol/L (ref 3.5–5.1)
Sodium: 138 mmol/L (ref 135–145)

## 2024-05-28 MED ORDER — PERFLUTREN LIPID MICROSPHERE
1.0000 mL | INTRAVENOUS | Status: DC | PRN
  Administered 2024-05-28: 2 mL via INTRAVENOUS

## 2024-05-28 NOTE — Patient Instructions (Signed)
 There has been no changes to your medications.  Labs done today, your results will be available in MyChart, we will contact you for abnormal readings.  Your physician has requested that you have a cardiac MRI. Cardiac MRI uses a computer to create images of your heart as its beating, producing both still and moving pictures of your heart and major blood vessels. For further information please visit [email or web address]. Please follow the instruction sheet given to you today for more information.  Your physician recommends that you schedule a follow-up appointment in: 3 months.  If you have any questions or concerns before your next appointment please send us  a message through [place] or call our office at [phone].    TO LEAVE A MESSAGE FOR THE NURSE SELECT OPTION 2, PLEASE LEAVE A MESSAGE INCLUDING: YOUR NAME DATE OF BIRTH CALL BACK NUMBER REASON FOR CALL**this is important as we prioritize the call backs  YOU WILL RECEIVE A CALL BACK THE SAME DAY AS LONG AS YOU CALL BEFORE 4:00 PM  At the [facility], you and your health needs are our priority. As part of our continuing mission to provide you with exceptional heart care, we have created designated Provider Care Teams. These Care Teams include your primary Cardiologist (physician) and Advanced Practice Providers (APPs- Physician Assistants and Nurse Practitioners) who all work together to provide you with the care you need, when you need it.   You may see any of the following providers on your designated Care Team at your next follow up: Dr Toribio Fuel Dr Ezra Shuck Dr. Morene Brownie Greig Mosses, NP Caffie Shed, [place] [facility], [place] Beckey Coe, NP Jordan Lee, NP Ellouise Class, NP Tinnie Redman, PharmD Jaun Bash, PharmD   Please be sure to bring in all your medications bottles to every appointment.    Thank you for choosing [facility]

## 2024-05-28 NOTE — Progress Notes (Signed)
 "  ADVANCED HF CLINIC NOTE  Primary Care: McVey, Almarie Folks, PA-C Primary Cardiologist: Dr. Francyne HF Cardiologist: Dr. Rolan  Chief complaint: CHF  HPI: Zachary Hawkins is a 69 y.o. with a h/o HFpEF, OSA, CPAP, smoker, CKD Stage III, HTN, obesity,and PAF. Has had multiple ED/hospital visits over the last year.  Ongoing compliance issues.   Echo 12/22 EF 60-65%, RV ok.    Admitted 03/2022 with A/C hypoxic/hypercarbic respiratory failure and volume overload. Treated with antibiotics steroids, nebs and diuretics.    Admitted 04/29/22 with increased shortness of breath in the setting of HF exacerbation. Diuresed with IV lasix  and transitioned to lasix  80 mg twice a day   Evaluated in the ED 05/2021 with COPD exacerbation. Treated with nebulizers and steroids.    Admitted 06/23/21 with A/C respiratory failure and heart exacerbation. Placed on Bipap with improvement. Diuresed with IV lasix  and transitioned to torsemide  100 mg daily. D/C weigth 344 pounds. Discharged 06/26/21 .   Seen in [facility] 3/23, volume up, likely in setting of high salt intake. Instructed to take extra torsemide  50 mg daily.   Admitted 07/17/21 with a/c CHF, diuresed with IV lasix . Discharged home, weight 338 lbs.  Post hospital follow up 3/23, NYHA III, volume OK on exam.   Admitted 4/23 with acute on chronic respiratory failure 2/2 CPAP noncompliance and unintentional pain medication overdose. Failed NIV and required intubation. CHF compensated.  Admitted 5/23 with SOB and AKI. GDMT held. Weight down to 325 lbs at discharge, new SCr baseline 1.5. Amlodipine , torsemide  and spiro stopped at discharge. Saw general cardiology at follow up and torsemide  restarted at 60 mg daily and spiro at 25 mg daily.  Admitted 12/24 with A/C respiratory failure and A/C HFpEF. Diuresed with IV lasix  and transitioned to torsemide  80 mg daily.   Echo 11/25 EF 60-65%, severe LVH, no significant valvular abnormalities, RV not well visualized.  IVC not dilated. Echo was done today and reviewed, EF 55-60%, moderate LVH, normal RV, normal IVC.   Today he returns for AHF follow up. Weight down 9 lbs.  He is taking torsemide  20 mg bid.  He is doing pulmonary rehab. He is using home oxygen, still waiting to get CPAP.  He is short of breath walking about 1/2 block.  He has 2 pillow orthopnea chronically.  No exertional chest pain.  No lightheadedness or palpitations.   ECG (personally reviewed): NSR, normal  Labs (12/25): BNP 10.5, hgb 14.8, K 4.3, creatinine 1.8  PMH: 1. CKD stage 3 2. OSA: Uses CPAP.  3. HTN 4. Atrial fibrillation: Paroxysmal.  5. COPD: Prior smoker, quit in early 2023.  - Uses 2 L home oxygen.  6. Erectile dysfunction 7. Chronic diastolic CHF: Echo (12/22) with EF 60-65%, normal RV, normal IVC.  - Echo (11/25): EF 60-65%, severe LVH, RV not visualized.  - Echo (1/26): EF 55-60%, moderate LVH, normal RV, normal IVC.  8. Hyperlipidemia  ROS: All systems reviewed and negative except as per HPI.   Current Outpatient Medications  Medication Sig Dispense Refill   albuterol  (PROVENTIL ) (2.5 MG/3ML) 0.083% nebulizer solution Take 3 mLs (2.5 mg total) by nebulization every 6 (six) hours as needed for wheezing or shortness of breath. 75 mL 5   albuterol  (VENTOLIN  HFA) 108 (90 Base) MCG/ACT inhaler Inhale 2 puffs into the lungs every 4 (four) hours as needed for wheezing or shortness of breath. 18 g 6   amLODipine  (NORVASC ) 2.5 MG tablet Take 1 tablet (2.5 mg total) by mouth  daily. 90 tablet 3   amoxicillin  (AMOXIL ) 875 MG tablet Take 875 mg by mouth 2 (two) times daily.     apixaban  (ELIQUIS ) 5 MG TABS tablet TAKE 1 TABLET (5 MG TOTAL) BY MOUTH 2 (TWO) TIMES DAILY (AM+PM) 60 tablet 3   atorvastatin  (LIPITOR) 10 MG tablet TAKE 1 TABLET (10 MG TOTAL) BY MOUTH DAILY. (AM) 90 tablet 1   atorvastatin  (LIPITOR) 20 MG tablet Take 20 mg by mouth daily.     bisoprolol  (ZEBETA ) 5 MG tablet TAKE 1 TABLET (5 MG TOTAL) BY MOUTH  DAILY(AM) 30 tablet 3   EPINEPHrine  0.3 mg/0.3 mL IJ SOAJ injection Inject 0.3 mg into the muscle as needed for anaphylaxis. 1 each 1   famotidine  (PEPCID ) 20 MG tablet Take 1 tablet (20 mg total) by mouth 2 (two) times daily. 28 tablet 0   FARXIGA  10 MG TABS tablet TAKE 1 TABLET (10 MG TOTAL) BY MOUTH DAILY(AM) 30 tablet 3   fluticasone  (FLONASE ) 50 MCG/ACT nasal spray PLACE 1 SPRAY INTO BOTH NOSTRILS DAILY. 16 g 11   fluticasone -salmeterol (ADVAIR  DISKUS) 250-50 MCG/ACT AEPB Inhale 1 puff into the lungs in the morning and at bedtime. 60 each 11   OZEMPIC, 0.25 OR 0.5 MG/DOSE, 2 MG/3ML SOPN Inject 0.5 mg into the skin once a week. Wednesday     potassium chloride  SA (KLOR-CON  M) 20 MEQ tablet TAKE 1 TABLET (20 MEQ TOTAL) BY MOUTH DAILY. (AM) 30 tablet 3   spironolactone  (ALDACTONE ) 25 MG tablet TAKE 1 TABLET (25 MG TOTAL) BY MOUTH DAILY. (AM) 90 tablet 1   Tiotropium Bromide Monohydrate  (SPIRIVA  RESPIMAT) 2.5 MCG/ACT AERS Inhale 2 puffs into the lungs daily. 4 g 11   torsemide  (DEMADEX ) 20 MG tablet TAKE 3 TABLETS (60 MG TOTAL) BY MOUTH DAILY. 180 tablet 1   No current facility-administered medications for this encounter.   Allergies  Allergen Reactions   Ace Inhibitors Anaphylaxis   Black Cherry Fruit Extract Carolee Extract] Anaphylaxis    Tongue   Conservation Officer, Nature Agent (Non-Screening)] Anaphylaxis   Social History   Socioeconomic History   Marital status: Single    Spouse name: Not on file   Number of children: 5   Years of education: Not on file   Highest education level: 12th grade  Occupational History   Not on file  Tobacco Use   Smoking status: Former    Current packs/day: 0.00    Average packs/day: 1 pack/day for 36.0 years (36.0 ttl pk-yrs)    Types: Cigarettes    Start date: 01/08/1985    Quit date: 01/08/2021    Years since quitting: 3.3   Smokeless tobacco: Never  Vaping Use   Vaping status: Never Used  Substance and Sexual Activity   Alcohol use: Yes     Alcohol/week: 1.0 standard drink of alcohol    Types: 1 Shots of liquor per week    Comment: 1-2 glasses a week   Drug use: Not Currently    Types: Marijuana   Sexual activity: Not on file  Other Topics Concern   Not on file  Social History Narrative   Lives alone   Social Drivers of Health   Tobacco Use: Medium Risk (05/28/2024)   Patient History    Smoking Tobacco Use: Former    Smokeless Tobacco Use: Never    Passive Exposure: Not on file  Financial Resource Strain: High Risk (07/15/2021)   Overall Financial Resource Strain (CARDIA)    Difficulty of Paying Living Expenses: Very  hard  Food Insecurity: Low Risk (02/21/2024)   Received from [facility]   Epic    Within the past 12 months, you worried that your food would run out before you got money to buy more: Never true    Within the past 12 months, the food you bought just didn't last and you didn't have money to get more. : Never true  Transportation Needs: No Transportation Needs (02/21/2024)   Received from [facility]    In the past 12 months, has lack of reliable transportation kept you from medical appointments, meetings, work or from getting things needed for daily living? : No  Physical Activity: Not on file  Stress: Not on file  Social Connections: Unknown (09/22/2021)   Received from [facility]   Social Network    Social Network: Not on file  Intimate Partner Violence: Unknown (08/14/2021)   Received from [facility]   HITS    Physically Hurt: Not on file    Insult or Talk Down To: Not on file    Threaten Physical Harm: Not on file    Scream or Curse: Not on file  Depression (PHQ2-9): Low Risk (04/25/2024)   Depression (PHQ2-9)    PHQ-2 Score: 1  Alcohol Screen: Not on file  Housing: Low Risk (02/21/2024)   Received from [facility]   Epic    What is your living situation today?: I have a steady place to live    Think about the place you live. Do you have problems with any of  the following? Choose all that apply:: None/None on this list  Utilities: Low Risk (02/21/2024)   Received from [facility]   Utilities    In the past 12 months has the electric, gas, oil, or water company threatened to shut off services in your home? : No  Health Literacy: Not on file   Family History  Problem Relation Age of Onset   Diabetes Mother    Diabetes Father    BP 126/84   Pulse 97   Ht 6' (1.829 m)   Wt (!) 141.5 kg (312 lb)   SpO2 97%   BMI 42.31 kg/m   Wt Readings from Last 3 Encounters:  05/28/24 (!) 141.5 kg (312 lb)  05/01/24 (!) 142.7 kg (314 lb 9.5 oz)  04/25/24 (!) 144.1 kg (317 lb 10.9 oz)   PHYSICAL EXAM: General: NAD Neck: Thick, JVP difficult, no thyromegaly or thyroid  nodule.  Lungs: Distant BS.  CV: Nondisplaced PMI.  Heart regular S1/S2, no S3/S4, no murmur.  No peripheral edema.  No carotid bruit.  Normal pedal pulses.  Abdomen: Soft, nontender, no hepatosplenomegaly, no distention.  Skin: Intact without lesions or rashes.  Neurologic: Alert and oriented x 3.  Psych: Normal affect. Extremities: No clubbing or cyanosis.  HEENT: Normal.   ASSESSMENT & PLAN: 1. Chronic diastolic heart failure: Echo in 12/22 with EF 60-65%, RV normal. Multiple admissions for CHF in 2023. Echo today showed EF 60-65%, moderate LVH, normal RV.  NYHA class III symptoms.  Volume status difficult by exam but does not look significantly volume overloaded and weight is down.  Symptoms confounded by COPD.   - Continue bisoprolol  to 5 mg daily.  - Angioedema with ACEI so not a candidate for ARNI or ACEI.  - Continue Farxiga  10 mg daily. - Continue spironolactone  25 mg daily. - We discussed cardiomems today.  I think he would be a good candidate for this with admissions for CHF  and difficult ascertaining volume status by exam.  I went over risks/benefits and he agrees to procedure.  - Continue torsemide  20 mg bid, BMET/BNP today.  - I think that we should rule out cardiac  amyloidosis.  I will arrange for cardiac MRI (unable to get PYP with radiotracer shortage) and will check myeloma panel and urine immunofixation.  2. Atrial fibrillation: Paroxysmal. NSR today.  - Continue bisoprolol .  - Continue Eliquis  5 mg bid.  3. OSA: Waiting to get CPAP.   4. COPD: Prior smoker.  Uses home oxygen.  - followed by pulmonology - Continue pulmonary rehab.  5. Obesity: Body mass index is 42.31 kg/m. - Continue semaglutide.  6. CKD Stage III:  - Continue SGLT2i.  - check BMP today  7. Hyperlipidemia: Continue atorvastatin .  8. Hypertension: BP controlled.  - Continue bisoprolol  5 mg daily  - Continue amlodipine  2.5 mg daily  - Continue spironolactone  25 mg daily.  9. ETOH/Marijuana use - cessation advised  Follow up in 3 months with APP.    I spent 41 minutes reviewing records, interviewing/examining patient, and managing orders.    Ezra Shuck, MD 05/28/2024 "

## 2024-05-29 ENCOUNTER — Encounter (HOSPITAL_COMMUNITY): Admission: RE | Admit: 2024-05-29 | Source: Ambulatory Visit

## 2024-05-29 ENCOUNTER — Encounter (HOSPITAL_COMMUNITY)

## 2024-05-29 ENCOUNTER — Telehealth (HOSPITAL_COMMUNITY): Payer: Self-pay

## 2024-05-29 LAB — KAPPA/LAMBDA LIGHT CHAINS
Kappa free light chain: 30.9 mg/L — ABNORMAL HIGH (ref 3.3–19.4)
Kappa, lambda light chain ratio: 1.02 (ref 0.26–1.65)
Lambda free light chains: 30.4 mg/L — ABNORMAL HIGH (ref 5.7–26.3)

## 2024-05-29 NOTE — Telephone Encounter (Signed)
 Called Zachary Hawkins since he has been absent from Pulmonary Rehab. He state he had a personal commitment to attend to. I did mention to Zachary Hawkins that if this was not a good time to do Pulmonary Rehab. He could be discharged from the program and receive a new referral at a later time. He voiced understanding. He plans to return on 1/27. Pt has our number if needs to call us .

## 2024-05-30 NOTE — Progress Notes (Signed)
 Pulmonary Individual Treatment Plan  Patient Details  Name: Zachary Hawkins MRN: [id number] Date of Birth: 28-Apr-1956 Referring Provider:   Conrad Ports Pulmonary Rehab Walk Test from 04/25/2024 in [facility]  Referring Provider Dr. Kara    Initial Encounter Date:  Flowsheet Row Pulmonary Rehab Walk Test from 04/25/2024 in [facility] for [facility], Vascular, [facility]  Date 04/25/24    Visit Diagnosis: Chronic respiratory failure, unspecified whether with hypoxia or hypercapnia (HCC)  Patient's Home Medications on Admission:  Current Medications[1]  Past Medical History: Past Medical History:  Diagnosis Date   Asthma    BPH (benign prostatic hyperplasia)    Cardiomyopathy (HCC) 06/2017   EF 40-45%   CHF (congestive heart failure) (HCC)    Chronic renal insufficiency, stage 3 (moderate)    COPD (chronic obstructive pulmonary disease) (HCC)    Diabetes mellitus without complication (HCC)    Diastolic dysfunction 06/2017   grade 2    Hyperlipidemia    Hypertension    Hypertensive urgency    Obesity    Obesity    OSA (obstructive sleep apnea)    Oxygen dependent    Paroxysmal atrial fibrillation (HCC) 2022   on Eliquis     Tobacco Use: Tobacco Use History[2]  Labs: Review Flowsheet  More data exists      Latest Ref Rng & Units 08/15/2021 02/02/2022 05/04/2022 05/05/2022 06/10/2023  Labs for ITP Cardiac and Pulmonary Rehab  Cholestrol 0 - 200 mg/dL - 857  - - -  LDL (calc) 0 - 99 mg/dL - 52  - - -  HDL-C >59 mg/dL - 47  - - -  Trlycerides <150 mg/dL 883  786  - 817  -  Hemoglobin A1c 4.8 - 5.6 % - - 7.2  - 6.5   PH, Arterial 7.35 - 7.45 7.434  7.416  - 7.474  - -  PCO2 arterial 32 - 48 mmHg 62.8  63.1  - 50.0  - -  Bicarbonate 20.0 - 28.0 mmol/L 42.4  40.6  - 36.7  - -  TCO2 22 - 32 mmol/L 44  43  - 38  - -  O2 Saturation % 92  99  - 99  - -    Details       Multiple values  from one day are sorted in reverse-chronological order         Capillary Blood Glucose: Lab Results  Component Value Date   GLUCAP 129 (H) 06/16/2023   GLUCAP 99 06/16/2023   GLUCAP 102 (H) 06/16/2023   GLUCAP 209 (H) 06/10/2023   GLUCAP 129 (H) 05/05/2022     Pulmonary Assessment Scores:  Pulmonary Assessment Scores     Row Name 04/25/24 1421         ADL UCSD   ADL Phase Entry     SOB Score total 22       CAT Score   CAT Score 15       mMRC Score   mMRC Score 3       UCSD: Self-administered rating of dyspnea associated with activities of daily living (ADLs) 6-point scale (0 = not at all to 5 = maximal or unable to do because of breathlessness)  Scoring Scores range from 0 to 120.  Minimally important difference is 5 units  CAT: CAT can identify the health impairment of COPD patients and is better correlated with disease progression.  CAT has  a scoring range of zero to 40. The CAT score is classified into four groups of low (less than 10), medium (10 - 20), high (21-30) and very high (31-40) based on the impact level of disease on health status. A CAT score over 10 suggests significant symptoms.  A worsening CAT score could be explained by an exacerbation, poor medication adherence, poor inhaler technique, or progression of COPD or comorbid conditions.  CAT MCID is 2 points  mMRC: mMRC (Modified Medical Research Council) Dyspnea Scale is used to assess the degree of baseline functional disability in patients of respiratory disease due to dyspnea. No minimal important difference is established. A decrease in score of 1 point or greater is considered a positive change.   Pulmonary Function Assessment:  Pulmonary Function Assessment - 04/25/24 1340       Breath   Bilateral Breath Sounds Decreased    Shortness of Breath Yes;Limiting activity          Exercise Target Goals: Exercise Program Goal: Individual exercise prescription set using results from  initial 6 min walk test and THRR while considering  patients activity barriers and safety.   Exercise Prescription Goal: Initial exercise prescription builds to 30-45 minutes a day of aerobic activity, 2-3 days per week.  Home exercise guidelines will be given to patient during program as part of exercise prescription that the participant will acknowledge.  Activity Barriers & Risk Stratification:  Activity Barriers & Cardiac Risk Stratification - 04/25/24 1334       Activity Barriers & Cardiac Risk Stratification   Activity Barriers Deconditioning;Muscular Weakness;Shortness of Breath;History of Falls    Cardiac Risk Stratification Moderate          6 Minute Walk:  6 Minute Walk     Row Name 04/25/24 1424         6 Minute Walk   Phase Initial     Distance 1200 feet     Walk Time 6 minutes     # of Rest Breaks 0     MPH 2.27     METS 2.33     RPE 7     Perceived Dyspnea  1     VO2 Peak 8.14     Symptoms No     Resting HR 95 bpm     Resting BP 120/80     Resting Oxygen Saturation  97 %     Exercise Oxygen Saturation  during 6 min walk 93 %  on 3L [place]     Max Ex. HR 104 bpm     Max Ex. BP 162/90     2 Minute Post BP 120/88       Interval HR   1 Minute HR 95     2 Minute HR 98     3 Minute HR 101     4 Minute HR 103     5 Minute HR 104     6 Minute HR 104     2 Minute Post HR 95     Interval Heart Rate? Yes       Interval Oxygen   Interval Oxygen? Yes     Baseline Oxygen Saturation % 97 %     1 Minute Oxygen Saturation % 95 %     1 Minute Liters of Oxygen 3 L     2 Minute Oxygen Saturation % 93 %     2 Minute Liters of Oxygen 3 L     3 Minute Oxygen Saturation %  94 %     3 Minute Liters of Oxygen 3 L     4 Minute Oxygen Saturation % 95 %     4 Minute Liters of Oxygen 3 L     5 Minute Oxygen Saturation % 95 %     5 Minute Liters of Oxygen 3 L     6 Minute Oxygen Saturation % 95 %     6 Minute Liters of Oxygen 3 L     2 Minute Post Oxygen Saturation % 97  %     2 Minute Post Liters of Oxygen 3 L        Oxygen Initial Assessment:  Oxygen Initial Assessment - 04/25/24 1339       Home Oxygen   Home Oxygen Device Portable Concentrator;Home Concentrator;E-Tanks    Sleep Oxygen Prescription Continuous    Liters per minute 3    Home Exercise Oxygen Prescription Continuous    Liters per minute 3    Home Resting Oxygen Prescription Continuous    Liters per minute 3    Compliance with Home Oxygen Use Yes      Initial 6 min Walk   Oxygen Used Continuous    Liters per minute 3      Program Oxygen Prescription   Program Oxygen Prescription Continuous    Liters per minute 3      Intervention   Short Term Goals To learn and exhibit compliance with exercise, home and travel O2 prescription;To learn and understand importance of monitoring SPO2 with pulse oximeter and demonstrate accurate use of the pulse oximeter.;To learn and understand importance of maintaining oxygen saturations>88%;To learn and demonstrate proper pursed lip breathing techniques or other breathing techniques. ;To learn and demonstrate proper use of respiratory medications    Long  Term Goals Exhibits compliance with exercise, home  and travel O2 prescription;Maintenance of O2 saturations>88%;Compliance with respiratory medication;Verbalizes importance of monitoring SPO2 with pulse oximeter and return demonstration;Exhibits proper breathing techniques, such as pursed lip breathing or other method taught during program session;Demonstrates proper use of MDIs          Oxygen Re-Evaluation:  Oxygen Re-Evaluation     Row Name 04/27/24 1442 05/21/24 0713           Program Oxygen Prescription   Program Oxygen Prescription Continuous Continuous      Liters per minute 3 3        Home Oxygen   Home Oxygen Device Portable Concentrator;Home Concentrator;E-Tanks Portable Concentrator;Home Concentrator;E-Tanks      Sleep Oxygen Prescription Continuous Continuous      Liters per  minute 3 3      Home Exercise Oxygen Prescription Continuous Continuous      Liters per minute 3 3      Home Resting Oxygen Prescription Continuous Continuous      Liters per minute 3 3      Compliance with Home Oxygen Use Yes Yes        Goals/Expected Outcomes   Short Term Goals To learn and exhibit compliance with exercise, home and travel O2 prescription;To learn and understand importance of monitoring SPO2 with pulse oximeter and demonstrate accurate use of the pulse oximeter.;To learn and understand importance of maintaining oxygen saturations>88%;To learn and demonstrate proper pursed lip breathing techniques or other breathing techniques. ;To learn and demonstrate proper use of respiratory medications To learn and exhibit compliance with exercise, home and travel O2 prescription;To learn and understand importance of monitoring SPO2 with pulse oximeter and demonstrate  accurate use of the pulse oximeter.;To learn and understand importance of maintaining oxygen saturations>88%;To learn and demonstrate proper pursed lip breathing techniques or other breathing techniques. ;To learn and demonstrate proper use of respiratory medications      Long  Term Goals Exhibits compliance with exercise, home  and travel O2 prescription;Maintenance of O2 saturations>88%;Compliance with respiratory medication;Verbalizes importance of monitoring SPO2 with pulse oximeter and return demonstration;Exhibits proper breathing techniques, such as pursed lip breathing or other method taught during program session;Demonstrates proper use of MDIs Exhibits compliance with exercise, home  and travel O2 prescription;Maintenance of O2 saturations>88%;Compliance with respiratory medication;Verbalizes importance of monitoring SPO2 with pulse oximeter and return demonstration;Exhibits proper breathing techniques, such as pursed lip breathing or other method taught during program session;Demonstrates proper use of MDIs       Goals/Expected Outcomes Compliance and understanding of oxygen saturation monitoring and breathing techniques to decrease shortness of breath. Compliance and understanding of oxygen saturation monitoring and breathing techniques to decrease shortness of breath.         Oxygen Discharge (Final Oxygen Re-Evaluation):  Oxygen Re-Evaluation - 05/21/24 0713       Program Oxygen Prescription   Program Oxygen Prescription Continuous    Liters per minute 3      Home Oxygen   Home Oxygen Device Portable Concentrator;Home Concentrator;E-Tanks    Sleep Oxygen Prescription Continuous    Liters per minute 3    Home Exercise Oxygen Prescription Continuous    Liters per minute 3    Home Resting Oxygen Prescription Continuous    Liters per minute 3    Compliance with Home Oxygen Use Yes      Goals/Expected Outcomes   Short Term Goals To learn and exhibit compliance with exercise, home and travel O2 prescription;To learn and understand importance of monitoring SPO2 with pulse oximeter and demonstrate accurate use of the pulse oximeter.;To learn and understand importance of maintaining oxygen saturations>88%;To learn and demonstrate proper pursed lip breathing techniques or other breathing techniques. ;To learn and demonstrate proper use of respiratory medications    Long  Term Goals Exhibits compliance with exercise, home  and travel O2 prescription;Maintenance of O2 saturations>88%;Compliance with respiratory medication;Verbalizes importance of monitoring SPO2 with pulse oximeter and return demonstration;Exhibits proper breathing techniques, such as pursed lip breathing or other method taught during program session;Demonstrates proper use of MDIs    Goals/Expected Outcomes Compliance and understanding of oxygen saturation monitoring and breathing techniques to decrease shortness of breath.          Initial Exercise Prescription:  Initial Exercise Prescription - 04/25/24 1400       Date of Initial  Exercise RX and Referring Provider   Date 04/25/24    Referring Provider Dr. Kara    Expected Discharge Date 07/26/24      Oxygen   Oxygen Continuous    Liters 3    Maintain Oxygen Saturation 88% or higher      T5 Nustep   Level 2    SPM 75    Minutes 15    METs 2.2      Track   Laps 9    Minutes 15    METs 2.38      Prescription Details   Frequency (times per week) 2    Duration Progress to 30 minutes of continuous aerobic without signs/symptoms of physical distress      Intensity   THRR 40-80% of Max Heartrate 62-123    Ratings of Perceived Exertion 11-13  Perceived Dyspnea 0-4      Progression   Progression Continue to progress workloads to maintain intensity without signs/symptoms of physical distress.      Resistance Training   Training Prescription Yes    Weight blue bands    Reps 10-15          Perform Capillary Blood Glucose checks as needed.  Exercise Prescription Changes:   Exercise Prescription Changes     Row Name 05/01/24 1500 05/17/24 0843           Response to Exercise   Blood Pressure (Admit) 110/70 106/70      Blood Pressure (Exercise) 140/70 --      Blood Pressure (Exit) 110/68 96/70      Heart Rate (Admit) 94 bpm 99 bpm      Heart Rate (Exercise) 100 bpm 101 bpm      Heart Rate (Exit) 99 bpm 90 bpm      Oxygen Saturation (Admit) 96 % 96 %      Oxygen Saturation (Exercise) 96 % 96 %      Oxygen Saturation (Exit) 96 % 95 %      Rating of Perceived Exertion (Exercise) 13 12      Perceived Dyspnea (Exercise) 1 1      Duration Continue with 30 min of aerobic exercise without signs/symptoms of physical distress. Continue with 30 min of aerobic exercise without signs/symptoms of physical distress.      Intensity THRR unchanged THRR unchanged        Progression   Progression Continue to progress workloads to maintain intensity without signs/symptoms of physical distress. Continue to progress workloads to maintain intensity without  signs/symptoms of physical distress.        Resistance Training   Weight blue bands blue bands      Reps 10-15 10-15      Time 10 Minutes 10 Minutes        Oxygen   Oxygen Continuous Continuous      Liters 3 3        NuStep   Level 2 2      SPM 47 83      Minutes 15 15      METs 1.3 2.1        Track   Laps 17 16      Minutes 15 15      METs 2.83 2.72        Oxygen   Maintain Oxygen Saturation 88% or higher 88% or higher         Exercise Comments:   Exercise Goals and Review:   Exercise Goals     Row Name 04/25/24 1307             Exercise Goals   Increase Physical Activity Yes       Intervention Provide advice, education, support and counseling about physical activity/exercise needs.;Develop an individualized exercise prescription for aerobic and resistive training based on initial evaluation findings, risk stratification, comorbidities and participant's personal goals.       Expected Outcomes Short Term: Attend rehab on a regular basis to increase amount of physical activity.;Long Term: Add in home exercise to make exercise part of routine and to increase amount of physical activity.;Long Term: Exercising regularly at least 3-5 days a week.       Increase Strength and Stamina Yes       Intervention Provide advice, education, support and counseling about physical activity/exercise needs.;Develop an individualized exercise prescription for  aerobic and resistive training based on initial evaluation findings, risk stratification, comorbidities and participant's personal goals.       Expected Outcomes Short Term: Increase workloads from initial exercise prescription for resistance, speed, and METs.;Short Term: Perform resistance training exercises routinely during rehab and add in resistance training at home;Long Term: Improve cardiorespiratory fitness, muscular endurance and strength as measured by increased METs and functional capacity ( )       Able to understand and  use rate of perceived exertion (RPE) scale Yes       Intervention Provide education and explanation on how to use RPE scale       Expected Outcomes Short Term: Able to use RPE daily in rehab to express subjective intensity level;Long Term:  Able to use RPE to guide intensity level when exercising independently       Able to understand and use Dyspnea scale Yes       Intervention Provide education and explanation on how to use Dyspnea scale       Expected Outcomes Short Term: Able to use Dyspnea scale daily in rehab to express subjective sense of shortness of breath during exertion;Long Term: Able to use Dyspnea scale to guide intensity level when exercising independently       Knowledge and understanding of Target Heart Rate Range (THRR) Yes       Intervention Provide education and explanation of THRR including how the numbers were predicted and where they are located for reference       Expected Outcomes Short Term: Able to state/look up THRR;Long Term: Able to use THRR to govern intensity when exercising independently;Short Term: Able to use daily as guideline for intensity in rehab       Understanding of Exercise Prescription Yes       Intervention Provide education, explanation, and written materials on patient's individual exercise prescription       Expected Outcomes Short Term: Able to explain program exercise prescription;Long Term: Able to explain home exercise prescription to exercise independently          Exercise Goals Re-Evaluation :  Exercise Goals Re-Evaluation     Row Name 04/27/24 1442 05/21/24 0710           Exercise Goal Re-Evaluation   Exercise Goals Review Increase Physical Activity;Able to understand and use Dyspnea scale;Understanding of Exercise Prescription;Increase Strength and Stamina;Knowledge and understanding of Target Heart Rate Range (THRR);Able to understand and use rate of perceived exertion (RPE) scale Increase Physical Activity;Able to understand and use  Dyspnea scale;Understanding of Exercise Prescription;Increase Strength and Stamina;Knowledge and understanding of Target Heart Rate Range (THRR);Able to understand and use rate of perceived exertion (RPE) scale      Comments Pt to begin exercise 12/23. Will progress as tolerated. Reyaan has completed 2 exercise sessions. He exercises for 15 min on the track and Nustep. He averages 2.72 METs on the track and 2.1 METs at level 2 on the Nustep. He performs the warmup and cooldown standing without limitations. It is too soon to notate any discernable progressions. Will continue to monitor and progress as able.      Expected Outcomes Through exercise at rehab and home, the patient will decrease shortness of breath with daily activities and feel confident in carrying out an exercise regimen at home. Through exercise at rehab and home, the patient will decrease shortness of breath with daily activities and feel confident in carrying out an exercise regimen at home.  Discharge Exercise Prescription (Final Exercise Prescription Changes):  Exercise Prescription Changes - 05/17/24 0843       Response to Exercise   Blood Pressure (Admit) 106/70    Blood Pressure (Exit) 96/70    Heart Rate (Admit) 99 bpm    Heart Rate (Exercise) 101 bpm    Heart Rate (Exit) 90 bpm    Oxygen Saturation (Admit) 96 %    Oxygen Saturation (Exercise) 96 %    Oxygen Saturation (Exit) 95 %    Rating of Perceived Exertion (Exercise) 12    Perceived Dyspnea (Exercise) 1    Duration Continue with 30 min of aerobic exercise without signs/symptoms of physical distress.    Intensity THRR unchanged      Progression   Progression Continue to progress workloads to maintain intensity without signs/symptoms of physical distress.      Resistance Training   Weight blue bands    Reps 10-15    Time 10 Minutes      Oxygen   Oxygen Continuous    Liters 3      NuStep   Level 2    SPM 83    Minutes 15    METs 2.1       Track   Laps 16    Minutes 15    METs 2.72      Oxygen   Maintain Oxygen Saturation 88% or higher          Nutrition:  Target Goals: Understanding of nutrition guidelines, daily intake of sodium 1500mg , cholesterol 200mg , calories 30% from fat and 7% or less from saturated fats, daily to have 5 or more servings of fruits and vegetables.  Biometrics:  Pre Biometrics - 04/25/24 1426       Pre Biometrics   Grip Strength 34 kg           Nutrition Therapy Plan and Nutrition Goals:  Nutrition Therapy & Goals - 05/01/24 1444       Nutrition Therapy   Diet Regular diet    Drug/Food Interactions Statins/Certain Fruits      Personal Nutrition Goals   Nutrition Goal Patient to improve diet quality by using the plate method as a guide for meal planning to include lean protein/plant protein, fruits, vegetables, whole grains, nonfat dairy as part of a well-balanced diet.    Personal Goal #2 Patient to identify strategies for weight loss with goal of 0.5-2 # per week of weight loss.    Comments Patient with history of h/o HFpEF, OSA, CPAP, CKD Stage III, HTN, obesity,and PAF. Pt reports weight loss of 3 lb over past week due to starting Ozempic. Endorses early satiety with meals. Examples of meals include 1/2 steak and cheese croissant with fries, chicken wings. Typically consumes baked vs fried chicken. Other animal protein includes steak and shrimp. Pt reports experience as Aten; often uses lemon juice, wine and broth to add flavor. Also reports increasing intake of vegetables such as spinach, peppers. RD provided assistance if helping pt complete Picture Your Plate Survey. Will continue to provide assistance if making healthy diet changes. Patient will benefit from participation in pulmonary rehab for nutrition education, exercise, and lifestyle modification.      Intervention Plan   Intervention Prescribe, educate and counsel regarding individualized specific dietary modifications  aiming towards targeted core components such as weight, hypertension, lipid management, diabetes, heart failure and other comorbidities.    Expected Outcomes Short Term Goal: Understand basic principles of dietary content, such as calories, fat,  sodium, cholesterol and nutrients.;Long Term Goal: Adherence to prescribed nutrition plan.          Nutrition Assessments:  MEDIFICTS Score Key: >=70 Need to make dietary changes  40-70 Heart Healthy Diet <= 40 Therapeutic Level Cholesterol Diet  Flowsheet Row PULMONARY REHAB OTHER RESPIRATORY from 05/01/2024 in [facility]  Picture Your Plate Total Score on Admission 53   Picture Your Plate Scores: <59 Unhealthy dietary pattern with much room for improvement. 41-50 Dietary pattern unlikely to meet recommendations for good health and room for improvement. 51-60 More healthful dietary pattern, with some room for improvement.  >60 Healthy dietary pattern, although there may be some specific behaviors that could be improved.    Nutrition Goals Re-Evaluation:   Nutrition Goals Discharge (Final Nutrition Goals Re-Evaluation):   Psychosocial: Target Goals: Acknowledge presence or absence of significant depression and/or stress, maximize coping skills, provide positive support system. Participant is able to verbalize types and ability to use techniques and skills needed for reducing stress and depression.  Initial Review & Psychosocial Screening:  Initial Psych Review & Screening - 04/25/24 1329       Initial Review   Current issues with None Identified      Family Dynamics   Good Support System? Yes      Barriers   Psychosocial barriers to participate in program There are no identifiable barriers or psychosocial needs.      Screening Interventions   Interventions Encouraged to exercise    Expected Outcomes Short Term goal: Identification and review with participant of any  Quality of Life or Depression concerns found by scoring the questionnaire.;Long Term goal: The participant improves quality of Life and PHQ9 Scores as seen by post scores and/or verbalization of changes          Quality of Life Scores:  Scores of 19 and below usually indicate a poorer quality of life in these areas.  A difference of  2-3 points is a clinically meaningful difference.  A difference of 2-3 points in the total score of the Quality of Life Index has been associated with significant improvement in overall quality of life, self-image, physical symptoms, and general health in studies assessing change in quality of life.  PHQ-9: Review Flowsheet  More data may exist      04/25/2024 06/26/2020 04/18/2019 10/20/2017 06/23/2017  Depression screen PHQ 2/9  Decreased Interest 0 0 0 0 0  Down, Depressed, Hopeless 0 0 0 0 0  PHQ - 2 Score 0 0 0 0 0  Altered sleeping 0 - - - 0  Tired, decreased energy 1 - - - 0  Change in appetite 0 - - - 0  Feeling bad or failure about yourself  0 - - - 0  Trouble concentrating 0 - - - 0  Moving slowly or fidgety/restless 0 - - - 0  Suicidal thoughts 0 - - - 0  PHQ-9 Score 1 - - - 0   Difficult doing work/chores Not difficult at all - - - -    Details       Data saved with a previous flowsheet row definition        Interpretation of Total Score  Total Score Depression Severity:  1-4 = Minimal depression, 5-9 = Mild depression, 10-14 = Moderate depression, 15-19 = Moderately severe depression, 20-27 = Severe depression   Psychosocial Evaluation and Intervention:  Psychosocial Evaluation - 04/25/24 1424  Psychosocial Evaluation & Interventions   Interventions Encouraged to exercise with the program and follow exercise prescription    Comments Nollan denies any psy/soc barriers or concerns. He states he has good support from his family and friends.    Expected Outcomes For Shermaine to participate in PR without any psy/soc barriers or  concerns    Continue Psychosocial Services  No Follow up required          Psychosocial Re-Evaluation:  Psychosocial Re-Evaluation     Row Name 04/27/24 0954 05/18/24 0907           Psychosocial Re-Evaluation   Current issues with None Identified None Identified      Comments 30 day psy/soc re-eval as follows: No changes since oreintation. Jonte is scheduled to start the program next week. 30 day psy/soc re-eval as follows: Que continue to denies any new psy/soc barriers or concerns. He has a good attitude when he comes to class and enjoying the program so far.      Expected Outcomes For Dessie to participate in PR without any psy/soc barriers or concerns For Harrie to participate in PR without any psy/soc barriers or concerns      Interventions Encouraged to attend Pulmonary Rehabilitation for the exercise Encouraged to attend Pulmonary Rehabilitation for the exercise      Continue Psychosocial Services  Follow up required by staff No Follow up required         Psychosocial Discharge (Final Psychosocial Re-Evaluation):  Psychosocial Re-Evaluation - 05/18/24 0907       Psychosocial Re-Evaluation   Current issues with None Identified    Comments 30 day psy/soc re-eval as follows: Danzel continue to denies any new psy/soc barriers or concerns. He has a good attitude when he comes to class and enjoying the program so far.    Expected Outcomes For Aleksi to participate in PR without any psy/soc barriers or concerns    Interventions Encouraged to attend Pulmonary Rehabilitation for the exercise    Continue Psychosocial Services  No Follow up required          Education: Education Goals: Education classes will be provided on a weekly basis, covering required topics. Participant will state understanding/return demonstration of topics presented.  Learning Barriers/Preferences:  Learning Barriers/Preferences - 04/25/24 1330       Learning Barriers/Preferences    Learning Barriers Sight    Learning Preferences None          Education Topics: Know Your Numbers Group instruction that is supported by a PowerPoint presentation. Instructor discusses importance of knowing and understanding resting, exercise, and post-exercise oxygen saturation, heart rate, and blood pressure. Oxygen saturation, heart rate, blood pressure, rating of perceived exertion, and dyspnea are reviewed along with a normal range for these values.    Exercise for the Pulmonary Patient Group instruction that is supported by a PowerPoint presentation. Instructor discusses benefits of exercise, core components of exercise, frequency, duration, and intensity of an exercise routine, importance of utilizing pulse oximetry during exercise, safety while exercising, and options of places to exercise outside of rehab.    MET Level  Group instruction provided by PowerPoint, verbal discussion, and written material to support subject matter. Instructor reviews what METs are and how to increase METs.    Pulmonary Medications Verbally interactive group education provided by instructor with focus on inhaled medications and proper administration.   Anatomy and Physiology of the Respiratory System Group instruction provided by PowerPoint, verbal discussion, and written material to support subject  matter. Instructor reviews respiratory cycle and anatomical components of the respiratory system and their functions. Instructor also reviews differences in obstructive and restrictive respiratory diseases with examples of each.    Oxygen Safety Group instruction provided by PowerPoint, verbal discussion, and written material to support subject matter. There is an overview of What is Oxygen and Why do we need it.  Instructor also reviews how to create a safe environment for oxygen use, the importance of using oxygen as prescribed, and the risks of noncompliance. There is a brief discussion on traveling  with oxygen and resources the patient may utilize. Flowsheet Row PULMONARY REHAB OTHER RESPIRATORY from 05/17/2024 in [facility]  Date 05/17/24  Educator RN  Instruction Review Code 1- Verbalizes Understanding    Oxygen Use Group instruction provided by PowerPoint, verbal discussion, and written material to discuss how supplemental oxygen is prescribed and different types of oxygen supply systems. Resources for more information are provided.    Breathing Techniques Group instruction that is supported by demonstration and informational handouts. Instructor discusses the benefits of pursed lip and diaphragmatic breathing and detailed demonstration on how to perform both.     Risk Factor Reduction Group instruction that is supported by a PowerPoint presentation. Instructor discusses the definition of a risk factor, different risk factors for pulmonary disease, and how the heart and lungs work together.   Pulmonary Diseases Group instruction provided by PowerPoint, verbal discussion, and written material to support subject matter. Instructor gives an overview of the different type of pulmonary diseases. There is also a discussion on risk factors and symptoms as well as ways to manage the diseases.   Stress and Energy Conservation Group instruction provided by PowerPoint, verbal discussion, and written material to support subject matter. Instructor gives an overview of stress and the impact it can have on the body. Instructor also reviews ways to reduce stress. There is also a discussion on energy conservation and ways to conserve energy throughout the day.   Warning Signs and Symptoms Group instruction provided by PowerPoint, verbal discussion, and written material to support subject matter. Instructor reviews warning signs and symptoms of stroke, heart attack, cold and flu. Instructor also reviews ways to prevent the spread of  infection.   Other Education Group or individual verbal, written, or video instructions that support the educational goals of the pulmonary rehab program.    Knowledge Questionnaire Score:  Knowledge Questionnaire Score - 04/25/24 1312       Knowledge Questionnaire Score   Pre Score 13/18          Core Components/Risk Factors/Patient Goals at Admission:  Personal Goals and Risk Factors at Admission - 04/25/24 1333       Core Components/Risk Factors/Patient Goals on Admission    Weight Management Yes;Weight Loss    Intervention Weight Management: Develop a combined nutrition and exercise program designed to reach desired caloric intake, while maintaining appropriate intake of nutrient and fiber, sodium and fats, and appropriate energy expenditure required for the weight goal.;Weight Management: Provide education and appropriate resources to help participant work on and attain dietary goals.;Weight Management/Obesity: Establish reasonable short term and long term weight goals.;Obesity: Provide education and appropriate resources to help participant work on and attain dietary goals.    Admit Weight 251 lb 8.7 oz (114.1 kg)    Expected Outcomes Short Term: Continue to assess and modify interventions until short term weight is achieved;Long Term: Adherence to nutrition and physical activity/exercise  program aimed toward attainment of established weight goal;Weight Loss: Understanding of general recommendations for a balanced deficit meal plan, which promotes 1-2 lb weight loss per week and includes a negative energy balance of [phone] kcal/d;Understanding recommendations for meals to include 15-35% energy as protein, 25-35% energy from fat, 35-60% energy from carbohydrates, less than 200mg  of dietary cholesterol, 20-35 gm of total fiber daily;Understanding of distribution of calorie intake throughout the day with the consumption of 4-5 meals/snacks    Tobacco Cessation Yes    Number of packs  per day --   smokes marijuana   Intervention Assist the participant in steps to quit. Provide individualized education and counseling about committing to Tobacco Cessation, relapse prevention, and pharmacological support that can be provided by physician.;Education officer, environmental, assist with locating and accessing local/national Quit Smoking programs, and support quit date choice.    Expected Outcomes Short Term: Will demonstrate readiness to quit, by selecting a quit date.;Short Term: Will quit all tobacco product use, adhering to prevention of relapse plan.;Long Term: Complete abstinence from all tobacco products for at least 12 months from quit date.    Improve shortness of breath with ADL's Yes    Intervention Provide education, individualized exercise plan and daily activity instruction to help decrease symptoms of SOB with activities of daily living.    Expected Outcomes Short Term: Improve cardiorespiratory fitness to achieve a reduction of symptoms when performing ADLs;Long Term: Be able to perform more ADLs without symptoms or delay the onset of symptoms          Core Components/Risk Factors/Patient Goals Review:   Goals and Risk Factor Review     Row Name 04/27/24 0955 05/18/24 0910           Core Components/Risk Factors/Patient Goals Review   Personal Goals Review Weight Management/Obesity;Tobacco Cessation;Improve shortness of breath with ADL's;Develop more efficient breathing techniques such as purse lipped breathing and diaphragmatic breathing and practicing self-pacing with activity. Weight Management/Obesity;Tobacco Cessation;Improve shortness of breath with ADL's;Develop more efficient breathing techniques such as purse lipped breathing and diaphragmatic breathing and practicing self-pacing with activity.      Review 30 day Core Components/Risk Factors/Patient Goals Review as follows: No changes since orientation. Dewell is scheduled to start the program next week.  Monthly review of patients Core Components/Risk Factors/Patient Goals are as follows: Goal progressing for weight loss. Ladd is working with the staff dietician to achieve his weight loss goals. Goal progressing for tobacco cessation. Goal progressing for improving shortness of breath with ADL's. He is currently using 3L O2 while exercising to keep sats >88%. He is exercising on the [place] and walking the track. Goal progressing for developing more efficient breathing techniques such as purse lipped breathing and diaphragmatic breathing; and practicing self-pacing with activity. We will continue to monitor Ziv's progress throughout the program.      Expected Outcomes Pt will show progress toward meeting expected goals and outcomes. To improve shortness of breath with ADL's, develop more efficient breathing techniques such as purse lipped breathing and diaphragmatic breathing; and practicing self-pacing with activity, quit smoking marijuana, and lose weight         Core Components/Risk Factors/Patient Goals at Discharge (Final Review):   Goals and Risk Factor Review - 05/18/24 0910       Core Components/Risk Factors/Patient Goals Review   Personal Goals Review Weight Management/Obesity;Tobacco Cessation;Improve shortness of breath with ADL's;Develop more efficient breathing techniques such as purse lipped breathing and diaphragmatic breathing and practicing self-pacing with activity.  Review Monthly review of patients Core Components/Risk Factors/Patient Goals are as follows: Goal progressing for weight loss. Camara is working with the staff dietician to achieve his weight loss goals. Goal progressing for tobacco cessation. Goal progressing for improving shortness of breath with ADL's. He is currently using 3L O2 while exercising to keep sats >88%. He is exercising [place] the [place] and walking the track. Goal progressing for developing more efficient breathing techniques such as purse lipped  breathing and diaphragmatic breathing; and practicing self-pacing with activity. We will continue to monitor Aqib's progress throughout the program.    Expected Outcomes To improve shortness of breath with ADL's, develop more efficient breathing techniques such as purse lipped breathing and diaphragmatic breathing; and practicing self-pacing with activity, quit smoking marijuana, and lose weight          ITP Comments:Pt is making expected progress toward Pulmonary Rehab goals after completing 2 session(s). Recommend continued exercise, life style modification, education, and utilization of breathing techniques to increase stamina and strength, while also decreasing shortness of breath with exertion.  Dr. Slater Staff is Medical Director for Pulmonary Rehab at [facility].         [1]  Current Outpatient Medications:    albuterol  (PROVENTIL ) (2.5 MG/3ML) 0.083% nebulizer solution, Take 3 mLs (2.5 mg total) by nebulization every 6 (six) hours as needed for wheezing or shortness of breath., Disp: 75 mL, Rfl: 5   albuterol  (VENTOLIN  HFA) 108 (90 Base) MCG/ACT inhaler, Inhale 2 puffs into the lungs every 4 (four) hours as needed for wheezing or shortness of breath., Disp: 18 g, Rfl: 6   amLODipine  (NORVASC ) 2.5 MG tablet, Take 1 tablet (2.5 mg total) by mouth daily., Disp: 90 tablet, Rfl: 3   amoxicillin  (AMOXIL ) 875 MG tablet, Take 875 mg by mouth 2 (two) times daily., Disp: , Rfl:    apixaban  (ELIQUIS ) 5 MG TABS tablet, TAKE 1 TABLET (5 MG TOTAL) BY MOUTH 2 (TWO) TIMES DAILY (AM+PM), Disp: 60 tablet, Rfl: 3   atorvastatin  (LIPITOR) 10 MG tablet, TAKE 1 TABLET (10 MG TOTAL) BY MOUTH DAILY. (AM), Disp: 90 tablet, Rfl: 1   atorvastatin  (LIPITOR) 20 MG tablet, Take 20 mg by mouth daily., Disp: , Rfl:    bisoprolol  (ZEBETA ) 5 MG tablet, TAKE 1 TABLET (5 MG TOTAL) BY MOUTH DAILY(AM), Disp: 30 tablet, Rfl: 3   EPINEPHrine  0.3 mg/0.3 mL IJ SOAJ injection, Inject 0.3 mg into the muscle as  needed for anaphylaxis., Disp: 1 each, Rfl: 1   famotidine  (PEPCID ) 20 MG tablet, Take 1 tablet (20 mg total) by mouth 2 (two) times daily., Disp: 28 tablet, Rfl: 0   FARXIGA  10 MG TABS tablet, TAKE 1 TABLET (10 MG TOTAL) BY MOUTH DAILY(AM), Disp: 30 tablet, Rfl: 3   fluticasone  (FLONASE ) 50 MCG/ACT nasal spray, PLACE 1 SPRAY INTO BOTH NOSTRILS DAILY., Disp: 16 g, Rfl: 11   fluticasone -salmeterol (ADVAIR  DISKUS) 250-50 MCG/ACT AEPB, Inhale 1 puff into the lungs in the morning and at bedtime., Disp: 60 each, Rfl: 11   OZEMPIC, 0.25 OR 0.5 MG/DOSE, 2 MG/3ML SOPN, Inject 0.5 mg into the skin once a week. Wednesday, Disp: , Rfl:    potassium chloride  SA (KLOR-CON  M) 20 MEQ tablet, TAKE 1 TABLET (20 MEQ TOTAL) BY MOUTH DAILY. (AM), Disp: 30 tablet, Rfl: 3   spironolactone  (ALDACTONE ) 25 MG tablet, TAKE 1 TABLET (25 MG TOTAL) BY MOUTH DAILY. (AM), Disp: 90 tablet, Rfl: 1   Tiotropium Bromide Monohydrate  (SPIRIVA  RESPIMAT) 2.5 MCG/ACT AERS,  Inhale 2 puffs into the lungs daily., Disp: 4 g, Rfl: 11   torsemide  (DEMADEX ) 20 MG tablet, TAKE 3 TABLETS (60 MG TOTAL) BY MOUTH DAILY., Disp: 180 tablet, Rfl: 1 [2]  Social History Tobacco Use  Smoking Status Former   Current packs/day: 0.00   Average packs/day: 1 pack/day for 36.0 years (36.0 ttl pk-yrs)   Types: Cigarettes   Start date: 01/08/1985   Quit date: 01/08/2021   Years since quitting: 3.3  Smokeless Tobacco Never

## 2024-05-31 ENCOUNTER — Encounter (HOSPITAL_COMMUNITY)

## 2024-06-02 LAB — COLOGUARD

## 2024-06-04 ENCOUNTER — Telehealth (HOSPITAL_COMMUNITY): Payer: Self-pay | Admitting: *Deleted

## 2024-06-04 NOTE — Telephone Encounter (Signed)
 Notified pt that his normal 8:15 Pulmonary Rehab class will be closed tomorrow due to icy conditions. He can come to the 10:15 or 1:15 class if he feels comfortable driving. He is unsure at this time but will assess the roads in the morning. Asked pt to call and let us  know his decision.  Aliene Aris BS, ACSM-CEP 06/04/2024 2:11 PM

## 2024-06-05 ENCOUNTER — Encounter (HOSPITAL_COMMUNITY)

## 2024-06-06 ENCOUNTER — Telehealth (HOSPITAL_COMMUNITY): Payer: Self-pay

## 2024-06-06 NOTE — Telephone Encounter (Signed)
 Called pt and LVM that our elevators are broke at this time. Encouraged pt to park on second floor and take the stairs if he feels comfortable to do that.

## 2024-06-07 ENCOUNTER — Encounter (HOSPITAL_COMMUNITY)

## 2024-06-11 ENCOUNTER — Telehealth (HOSPITAL_COMMUNITY): Payer: Self-pay

## 2024-06-11 NOTE — Telephone Encounter (Signed)
 Called pt due inclement weather. LVM with call back number.

## 2024-06-12 ENCOUNTER — Encounter (HOSPITAL_COMMUNITY)

## 2024-06-12 ENCOUNTER — Encounter (HOSPITAL_COMMUNITY): Admission: RE | Admit: 2024-06-12 | Source: Ambulatory Visit

## 2024-06-12 VITALS — Wt 315.0 lb

## 2024-06-14 ENCOUNTER — Encounter (HOSPITAL_COMMUNITY): Admission: RE | Admit: 2024-06-14

## 2024-06-14 ENCOUNTER — Encounter (HOSPITAL_COMMUNITY)

## 2024-06-15 ENCOUNTER — Telehealth (HOSPITAL_COMMUNITY): Payer: Self-pay

## 2024-06-15 NOTE — Telephone Encounter (Signed)
 LVM for pt about missing Pulm Rehab for the past month.

## 2024-06-15 NOTE — Telephone Encounter (Signed)
 Patient left message stating he will no longer be able to attend pulmonary rehab due to a lot of family issues going on at home, he no longer has time to come to classes. Cancelled appts, removed from schedule.

## 2024-06-19 ENCOUNTER — Encounter (HOSPITAL_COMMUNITY)

## 2024-06-21 ENCOUNTER — Encounter (HOSPITAL_COMMUNITY)

## 2024-06-26 ENCOUNTER — Encounter (HOSPITAL_COMMUNITY)

## 2024-06-28 ENCOUNTER — Encounter (HOSPITAL_COMMUNITY)

## 2024-07-03 ENCOUNTER — Encounter (HOSPITAL_COMMUNITY)

## 2024-07-03 ENCOUNTER — Ambulatory Visit: Admitting: Cardiovascular Disease

## 2024-07-05 ENCOUNTER — Encounter (HOSPITAL_COMMUNITY)

## 2024-07-10 ENCOUNTER — Encounter (HOSPITAL_COMMUNITY)

## 2024-07-12 ENCOUNTER — Encounter (HOSPITAL_COMMUNITY)

## 2024-07-17 ENCOUNTER — Encounter (HOSPITAL_COMMUNITY)

## 2024-07-19 ENCOUNTER — Encounter (HOSPITAL_COMMUNITY)

## 2024-07-24 ENCOUNTER — Encounter (HOSPITAL_COMMUNITY)

## 2024-07-26 ENCOUNTER — Encounter (HOSPITAL_COMMUNITY)

## 2024-08-27 ENCOUNTER — Ambulatory Visit (HOSPITAL_COMMUNITY)

## 2025-01-09 ENCOUNTER — Encounter (INDEPENDENT_AMBULATORY_CARE_PROVIDER_SITE_OTHER): Admitting: Ophthalmology
